# Patient Record
Sex: Male | Born: 1935 | Race: White | Hispanic: No | Marital: Married | Smoking: Former smoker
Health system: Southern US, Community
[De-identification: ages and names within clinical notes are randomized; demographics above are authoritative.]

## PROBLEM LIST (undated history)

## (undated) DIAGNOSIS — I350 Nonrheumatic aortic (valve) stenosis: Secondary | ICD-10-CM

## (undated) DIAGNOSIS — I714 Abdominal aortic aneurysm, without rupture: Secondary | ICD-10-CM

## (undated) DIAGNOSIS — K589 Irritable bowel syndrome without diarrhea: Secondary | ICD-10-CM

## (undated) DIAGNOSIS — I10 Essential (primary) hypertension: Secondary | ICD-10-CM

## (undated) DIAGNOSIS — I4821 Permanent atrial fibrillation: Secondary | ICD-10-CM

## (undated) DIAGNOSIS — I639 Cerebral infarction, unspecified: Secondary | ICD-10-CM

## (undated) DIAGNOSIS — E785 Hyperlipidemia, unspecified: Secondary | ICD-10-CM

## (undated) DIAGNOSIS — K219 Gastro-esophageal reflux disease without esophagitis: Secondary | ICD-10-CM

## (undated) DIAGNOSIS — R569 Unspecified convulsions: Secondary | ICD-10-CM

## (undated) HISTORY — PX: ABDOMINAL AORTIC ANEURYSM REPAIR: SUR1152

## (undated) HISTORY — DX: Hemochromatosis, unspecified: E83.119

## (undated) HISTORY — PX: EYE SURGERY: SHX253

## (undated) HISTORY — DX: Irritable bowel syndrome, unspecified: K58.9

## (undated) HISTORY — DX: Gastro-esophageal reflux disease without esophagitis: K21.9

## (undated) HISTORY — DX: Abdominal aortic aneurysm, without rupture: I71.4

## (undated) HISTORY — DX: Essential (primary) hypertension: I10

## (undated) HISTORY — DX: Permanent atrial fibrillation: I48.21

## (undated) HISTORY — DX: Hyperlipidemia, unspecified: E78.5

## (undated) HISTORY — DX: Cerebral infarction, unspecified: I63.9

## (undated) HISTORY — DX: Nonrheumatic aortic (valve) stenosis: I35.0

---

## 1999-09-19 ENCOUNTER — Encounter: Payer: Self-pay | Admitting: Neurology

## 1999-09-19 ENCOUNTER — Inpatient Hospital Stay (HOSPITAL_COMMUNITY): Admission: EM | Admit: 1999-09-19 | Discharge: 1999-09-22 | Payer: Self-pay | Admitting: Emergency Medicine

## 1999-09-19 ENCOUNTER — Encounter: Payer: Self-pay | Admitting: Emergency Medicine

## 1999-09-20 ENCOUNTER — Encounter: Payer: Self-pay | Admitting: Oncology

## 1999-09-25 ENCOUNTER — Encounter: Admission: RE | Admit: 1999-09-25 | Discharge: 1999-12-24 | Payer: Self-pay | Admitting: Neurology

## 2000-04-12 ENCOUNTER — Ambulatory Visit (HOSPITAL_COMMUNITY): Admission: RE | Admit: 2000-04-12 | Discharge: 2000-04-12 | Payer: Self-pay | Admitting: Gastroenterology

## 2000-04-19 ENCOUNTER — Encounter: Payer: Self-pay | Admitting: Gastroenterology

## 2000-04-19 ENCOUNTER — Ambulatory Visit (HOSPITAL_COMMUNITY): Admission: RE | Admit: 2000-04-19 | Discharge: 2000-04-19 | Payer: Self-pay | Admitting: Gastroenterology

## 2000-04-25 ENCOUNTER — Ambulatory Visit (HOSPITAL_COMMUNITY): Admission: RE | Admit: 2000-04-25 | Discharge: 2000-04-25 | Payer: Self-pay | Admitting: Gastroenterology

## 2000-04-25 ENCOUNTER — Encounter (INDEPENDENT_AMBULATORY_CARE_PROVIDER_SITE_OTHER): Payer: Self-pay | Admitting: *Deleted

## 2000-07-26 ENCOUNTER — Ambulatory Visit (HOSPITAL_COMMUNITY): Admission: RE | Admit: 2000-07-26 | Discharge: 2000-07-26 | Payer: Self-pay | Admitting: Gastroenterology

## 2000-07-26 ENCOUNTER — Encounter: Payer: Self-pay | Admitting: Gastroenterology

## 2000-08-09 ENCOUNTER — Ambulatory Visit (HOSPITAL_COMMUNITY): Admission: RE | Admit: 2000-08-09 | Discharge: 2000-08-09 | Payer: Self-pay | Admitting: Gastroenterology

## 2000-08-09 ENCOUNTER — Encounter: Payer: Self-pay | Admitting: Gastroenterology

## 2000-10-08 ENCOUNTER — Ambulatory Visit (HOSPITAL_COMMUNITY): Admission: RE | Admit: 2000-10-08 | Discharge: 2000-10-08 | Payer: Self-pay | Admitting: Gastroenterology

## 2000-10-08 ENCOUNTER — Encounter: Payer: Self-pay | Admitting: Gastroenterology

## 2000-10-22 ENCOUNTER — Emergency Department (HOSPITAL_COMMUNITY): Admission: EM | Admit: 2000-10-22 | Discharge: 2000-10-22 | Payer: Self-pay | Admitting: Emergency Medicine

## 2000-10-22 ENCOUNTER — Encounter: Payer: Self-pay | Admitting: Emergency Medicine

## 2001-09-18 ENCOUNTER — Emergency Department (HOSPITAL_COMMUNITY): Admission: EM | Admit: 2001-09-18 | Discharge: 2001-09-18 | Payer: Self-pay | Admitting: Emergency Medicine

## 2002-02-24 ENCOUNTER — Encounter: Payer: Self-pay | Admitting: *Deleted

## 2002-02-24 ENCOUNTER — Emergency Department (HOSPITAL_COMMUNITY): Admission: EM | Admit: 2002-02-24 | Discharge: 2002-02-25 | Payer: Self-pay | Admitting: Emergency Medicine

## 2002-03-02 ENCOUNTER — Ambulatory Visit (HOSPITAL_COMMUNITY): Admission: RE | Admit: 2002-03-02 | Discharge: 2002-03-02 | Payer: Self-pay | Admitting: Gastroenterology

## 2002-03-02 ENCOUNTER — Encounter: Payer: Self-pay | Admitting: Gastroenterology

## 2002-05-08 ENCOUNTER — Encounter: Payer: Self-pay | Admitting: Surgery

## 2002-05-11 ENCOUNTER — Encounter: Payer: Self-pay | Admitting: Surgery

## 2002-05-11 ENCOUNTER — Encounter (INDEPENDENT_AMBULATORY_CARE_PROVIDER_SITE_OTHER): Payer: Self-pay | Admitting: *Deleted

## 2002-05-11 ENCOUNTER — Ambulatory Visit (HOSPITAL_COMMUNITY): Admission: RE | Admit: 2002-05-11 | Discharge: 2002-05-11 | Payer: Self-pay | Admitting: Surgery

## 2002-08-06 ENCOUNTER — Ambulatory Visit: Admission: RE | Admit: 2002-08-06 | Discharge: 2002-08-06 | Payer: Self-pay | Admitting: Internal Medicine

## 2002-08-11 ENCOUNTER — Ambulatory Visit (HOSPITAL_COMMUNITY): Admission: RE | Admit: 2002-08-11 | Discharge: 2002-08-11 | Payer: Self-pay | Admitting: Internal Medicine

## 2002-08-11 ENCOUNTER — Encounter: Payer: Self-pay | Admitting: Internal Medicine

## 2002-09-29 ENCOUNTER — Ambulatory Visit (HOSPITAL_COMMUNITY): Admission: RE | Admit: 2002-09-29 | Discharge: 2002-09-29 | Payer: Self-pay | Admitting: Surgery

## 2003-07-28 ENCOUNTER — Encounter (INDEPENDENT_AMBULATORY_CARE_PROVIDER_SITE_OTHER): Payer: Self-pay | Admitting: Specialist

## 2003-07-28 ENCOUNTER — Ambulatory Visit (HOSPITAL_COMMUNITY): Admission: RE | Admit: 2003-07-28 | Discharge: 2003-07-28 | Payer: Self-pay | Admitting: Gastroenterology

## 2004-08-09 ENCOUNTER — Ambulatory Visit: Payer: Self-pay | Admitting: Oncology

## 2004-11-24 ENCOUNTER — Encounter: Admission: RE | Admit: 2004-11-24 | Discharge: 2004-11-24 | Payer: Self-pay | Admitting: Internal Medicine

## 2005-02-13 ENCOUNTER — Ambulatory Visit: Payer: Self-pay | Admitting: Oncology

## 2005-08-21 ENCOUNTER — Ambulatory Visit: Payer: Self-pay | Admitting: Oncology

## 2005-10-30 ENCOUNTER — Encounter: Admission: RE | Admit: 2005-10-30 | Discharge: 2005-10-30 | Payer: Self-pay | Admitting: Internal Medicine

## 2005-11-06 ENCOUNTER — Encounter: Admission: RE | Admit: 2005-11-06 | Discharge: 2005-11-06 | Payer: Self-pay | Admitting: Internal Medicine

## 2006-01-29 ENCOUNTER — Ambulatory Visit: Payer: Self-pay | Admitting: Oncology

## 2006-01-31 LAB — COMPREHENSIVE METABOLIC PANEL
Albumin: 4.9 g/dL (ref 3.5–5.2)
CO2: 29 mEq/L (ref 19–32)
Chloride: 97 mEq/L (ref 96–112)
Glucose, Bld: 75 mg/dL (ref 70–99)
Potassium: 4.7 mEq/L (ref 3.5–5.3)
Sodium: 137 mEq/L (ref 135–145)
Total Protein: 7 g/dL (ref 6.0–8.3)

## 2006-01-31 LAB — CBC WITH DIFFERENTIAL/PLATELET
Eosinophils Absolute: 0.2 10*3/uL (ref 0.0–0.5)
MONO#: 0.6 10*3/uL (ref 0.1–0.9)
NEUT#: 2.6 10*3/uL (ref 1.5–6.5)
RBC: 4.35 10*6/uL (ref 4.20–5.71)
RDW: 13.1 % (ref 11.2–14.6)
WBC: 4.8 10*3/uL (ref 4.0–10.0)

## 2006-01-31 LAB — FERRITIN: Ferritin: 88 ng/mL (ref 22–322)

## 2006-02-21 ENCOUNTER — Encounter: Admission: RE | Admit: 2006-02-21 | Discharge: 2006-02-21 | Payer: Self-pay | Admitting: Internal Medicine

## 2006-06-12 ENCOUNTER — Encounter: Admission: RE | Admit: 2006-06-12 | Discharge: 2006-06-12 | Payer: Self-pay | Admitting: Internal Medicine

## 2006-08-07 ENCOUNTER — Ambulatory Visit: Payer: Self-pay | Admitting: Oncology

## 2006-08-12 LAB — CBC WITH DIFFERENTIAL/PLATELET
BASO%: 1 % (ref 0.0–2.0)
LYMPH%: 17.9 % (ref 14.0–48.0)
MCHC: 36.1 g/dL — ABNORMAL HIGH (ref 32.0–35.9)
MONO#: 0.4 10*3/uL (ref 0.1–0.9)
Platelets: 275 10*3/uL (ref 145–400)
RBC: 4.4 10*6/uL (ref 4.20–5.71)
WBC: 7.7 10*3/uL (ref 4.0–10.0)
lymph#: 1.4 10*3/uL (ref 0.9–3.3)

## 2006-08-12 LAB — FERRITIN: Ferritin: 93 ng/mL (ref 22–322)

## 2006-08-12 LAB — COMPREHENSIVE METABOLIC PANEL
ALT: 16 U/L (ref 0–53)
AST: 17 U/L (ref 0–37)
Alkaline Phosphatase: 55 U/L (ref 39–117)
CO2: 26 mEq/L (ref 19–32)
Sodium: 134 mEq/L — ABNORMAL LOW (ref 135–145)
Total Bilirubin: 1.7 mg/dL — ABNORMAL HIGH (ref 0.3–1.2)
Total Protein: 6.9 g/dL (ref 6.0–8.3)

## 2006-11-04 ENCOUNTER — Encounter: Admission: RE | Admit: 2006-11-04 | Discharge: 2006-11-04 | Payer: Self-pay | Admitting: Internal Medicine

## 2006-12-05 ENCOUNTER — Ambulatory Visit: Payer: Self-pay | Admitting: *Deleted

## 2007-01-30 ENCOUNTER — Ambulatory Visit: Payer: Self-pay | Admitting: Oncology

## 2007-02-06 LAB — COMPREHENSIVE METABOLIC PANEL
ALT: 13 U/L (ref 0–53)
Alkaline Phosphatase: 56 U/L (ref 39–117)
CO2: 24 mEq/L (ref 19–32)
Creatinine, Ser: 0.81 mg/dL (ref 0.40–1.50)
Glucose, Bld: 117 mg/dL — ABNORMAL HIGH (ref 70–99)
Sodium: 134 mEq/L — ABNORMAL LOW (ref 135–145)
Total Bilirubin: 2.1 mg/dL — ABNORMAL HIGH (ref 0.3–1.2)
Total Protein: 7.2 g/dL (ref 6.0–8.3)

## 2007-02-06 LAB — CBC WITH DIFFERENTIAL/PLATELET
BASO%: 0.8 % (ref 0.0–2.0)
HCT: 41.5 % (ref 38.7–49.9)
LYMPH%: 23 % (ref 14.0–48.0)
MCHC: 35.8 g/dL (ref 32.0–35.9)
MCV: 95 fL (ref 81.6–98.0)
MONO%: 7.9 % (ref 0.0–13.0)
NEUT%: 65.7 % (ref 40.0–75.0)
Platelets: 286 10*3/uL (ref 145–400)
RBC: 4.37 10*6/uL (ref 4.20–5.71)

## 2007-02-06 LAB — FERRITIN: Ferritin: 128 ng/mL (ref 22–322)

## 2007-06-05 ENCOUNTER — Ambulatory Visit: Payer: Self-pay | Admitting: *Deleted

## 2007-06-05 ENCOUNTER — Encounter: Admission: RE | Admit: 2007-06-05 | Discharge: 2007-06-05 | Payer: Self-pay | Admitting: *Deleted

## 2007-06-29 DIAGNOSIS — I714 Abdominal aortic aneurysm, without rupture, unspecified: Secondary | ICD-10-CM

## 2007-06-29 HISTORY — DX: Abdominal aortic aneurysm, without rupture: I71.4

## 2007-06-29 HISTORY — DX: Abdominal aortic aneurysm, without rupture, unspecified: I71.40

## 2007-07-18 ENCOUNTER — Inpatient Hospital Stay (HOSPITAL_COMMUNITY): Admission: RE | Admit: 2007-07-18 | Discharge: 2007-07-19 | Payer: Self-pay | Admitting: *Deleted

## 2007-07-18 ENCOUNTER — Encounter (INDEPENDENT_AMBULATORY_CARE_PROVIDER_SITE_OTHER): Payer: Self-pay | Admitting: *Deleted

## 2007-07-18 ENCOUNTER — Ambulatory Visit: Payer: Self-pay | Admitting: *Deleted

## 2007-07-18 HISTORY — PX: ENDOVASCULAR STENT INSERTION: SHX5161

## 2007-08-07 ENCOUNTER — Ambulatory Visit: Payer: Self-pay | Admitting: *Deleted

## 2007-08-08 ENCOUNTER — Ambulatory Visit: Payer: Self-pay | Admitting: Oncology

## 2007-08-12 LAB — CBC WITH DIFFERENTIAL/PLATELET
BASO%: 0.4 % (ref 0.0–2.0)
EOS%: 3.5 % (ref 0.0–7.0)
HGB: 15.3 g/dL (ref 13.0–17.1)
MCH: 33.5 pg — ABNORMAL HIGH (ref 28.0–33.4)
MCHC: 35.7 g/dL (ref 32.0–35.9)
RDW: 12.9 % (ref 11.2–14.6)
lymph#: 1.9 10*3/uL (ref 0.9–3.3)

## 2007-08-28 ENCOUNTER — Encounter: Admission: RE | Admit: 2007-08-28 | Discharge: 2007-08-28 | Payer: Self-pay | Admitting: *Deleted

## 2007-08-28 ENCOUNTER — Ambulatory Visit: Payer: Self-pay | Admitting: *Deleted

## 2007-08-29 ENCOUNTER — Ambulatory Visit (HOSPITAL_COMMUNITY): Admission: RE | Admit: 2007-08-29 | Discharge: 2007-08-29 | Payer: Self-pay | Admitting: Cardiology

## 2008-01-28 ENCOUNTER — Ambulatory Visit: Payer: Self-pay | Admitting: Oncology

## 2008-02-03 LAB — CBC WITH DIFFERENTIAL/PLATELET
BASO%: 0.5 % (ref 0.0–2.0)
LYMPH%: 26 % (ref 14.0–48.0)
MCHC: 35.5 g/dL (ref 32.0–35.9)
MONO#: 0.6 10*3/uL (ref 0.1–0.9)
Platelets: 243 10*3/uL (ref 145–400)
RBC: 4.38 10*6/uL (ref 4.20–5.71)
RDW: 13 % (ref 11.2–14.6)
WBC: 7.1 10*3/uL (ref 4.0–10.0)

## 2008-02-03 LAB — FERRITIN: Ferritin: 184 ng/mL (ref 22–322)

## 2008-02-06 ENCOUNTER — Encounter: Admission: RE | Admit: 2008-02-06 | Discharge: 2008-02-06 | Payer: Self-pay | Admitting: Gastroenterology

## 2008-03-01 LAB — FERRITIN: Ferritin: 140 ng/mL (ref 22–322)

## 2008-03-01 LAB — CBC WITH DIFFERENTIAL/PLATELET
BASO%: 0.5 % (ref 0.0–2.0)
LYMPH%: 28.6 % (ref 14.0–48.0)
MCHC: 35.6 g/dL (ref 32.0–35.9)
MONO#: 0.6 10*3/uL (ref 0.1–0.9)
RBC: 4.18 10*6/uL — ABNORMAL LOW (ref 4.20–5.71)
RDW: 13.4 % (ref 11.2–14.6)
WBC: 6 10*3/uL (ref 4.0–10.0)
lymph#: 1.7 10*3/uL (ref 0.9–3.3)

## 2008-03-11 ENCOUNTER — Ambulatory Visit: Payer: Self-pay | Admitting: *Deleted

## 2008-03-11 ENCOUNTER — Ambulatory Visit: Payer: Self-pay | Admitting: Oncology

## 2008-03-15 LAB — CBC WITH DIFFERENTIAL/PLATELET
Basophils Absolute: 0 10*3/uL (ref 0.0–0.1)
EOS%: 3.3 % (ref 0.0–7.0)
HGB: 13.6 g/dL (ref 13.0–17.1)
MCH: 33.9 pg — ABNORMAL HIGH (ref 28.0–33.4)
NEUT#: 3.8 10*3/uL (ref 1.5–6.5)
RDW: 14.1 % (ref 11.2–14.6)
WBC: 6 10*3/uL (ref 4.0–10.0)
lymph#: 1.4 10*3/uL (ref 0.9–3.3)

## 2008-03-15 LAB — FERRITIN: Ferritin: 110 ng/mL (ref 22–322)

## 2008-03-29 LAB — CBC WITH DIFFERENTIAL/PLATELET
BASO%: 0.6 % (ref 0.0–2.0)
EOS%: 3.6 % (ref 0.0–7.0)
MCH: 34.2 pg — ABNORMAL HIGH (ref 28.0–33.4)
MCV: 97 fL (ref 81.6–98.0)
MONO%: 10.7 % (ref 0.0–13.0)
RBC: 3.89 10*6/uL — ABNORMAL LOW (ref 4.20–5.71)
RDW: 13.8 % (ref 11.2–14.6)
lymph#: 1.2 10*3/uL (ref 0.9–3.3)

## 2008-03-29 LAB — FERRITIN: Ferritin: 54 ng/mL (ref 22–322)

## 2008-09-09 ENCOUNTER — Ambulatory Visit: Payer: Self-pay | Admitting: *Deleted

## 2009-01-17 ENCOUNTER — Ambulatory Visit (HOSPITAL_COMMUNITY): Admission: RE | Admit: 2009-01-17 | Discharge: 2009-01-17 | Payer: Self-pay | Admitting: Cardiology

## 2009-01-27 ENCOUNTER — Ambulatory Visit: Payer: Self-pay | Admitting: Oncology

## 2009-02-01 LAB — CBC WITH DIFFERENTIAL/PLATELET
BASO%: 0.6 % (ref 0.0–2.0)
Basophils Absolute: 0 10*3/uL (ref 0.0–0.1)
EOS%: 2 % (ref 0.0–7.0)
Eosinophils Absolute: 0.1 10*3/uL (ref 0.0–0.5)
HGB: 14.8 g/dL (ref 13.0–17.1)
MCV: 96.3 fL (ref 79.3–98.0)
MONO%: 7.1 % (ref 0.0–14.0)
NEUT#: 4.4 10*3/uL (ref 1.5–6.5)
NEUT%: 67.3 % (ref 39.0–75.0)
RBC: 4.49 10*6/uL (ref 4.20–5.82)
RDW: 13.2 % (ref 11.0–14.6)
WBC: 6.5 10*3/uL (ref 4.0–10.3)

## 2009-08-05 ENCOUNTER — Ambulatory Visit: Payer: Self-pay | Admitting: Oncology

## 2009-08-09 LAB — CBC WITH DIFFERENTIAL/PLATELET
BASO%: 0.3 % (ref 0.0–2.0)
EOS%: 2.8 % (ref 0.0–7.0)
MCH: 34.2 pg — ABNORMAL HIGH (ref 27.2–33.4)
MCV: 96.5 fL (ref 79.3–98.0)
MONO#: 0.5 10*3/uL (ref 0.1–0.9)
MONO%: 7.9 % (ref 0.0–14.0)
NEUT#: 4.4 10*3/uL (ref 1.5–6.5)
Platelets: 215 10*3/uL (ref 140–400)
WBC: 6.7 10*3/uL (ref 4.0–10.3)

## 2009-08-09 LAB — FERRITIN: Ferritin: 114 ng/mL (ref 22–322)

## 2009-08-29 ENCOUNTER — Ambulatory Visit: Payer: Self-pay | Admitting: Surgery

## 2010-02-08 ENCOUNTER — Ambulatory Visit: Payer: Self-pay | Admitting: Oncology

## 2010-02-13 LAB — CBC WITH DIFFERENTIAL/PLATELET
BASO%: 0.3 % (ref 0.0–2.0)
Basophils Absolute: 0 10*3/uL (ref 0.0–0.1)
HCT: 39.6 % (ref 38.4–49.9)
HGB: 13.6 g/dL (ref 13.0–17.1)
LYMPH%: 22 % (ref 14.0–49.0)
MCH: 33.6 pg — ABNORMAL HIGH (ref 27.2–33.4)
MCHC: 34.5 g/dL (ref 32.0–36.0)
NEUT%: 65.2 % (ref 39.0–75.0)
Platelets: 246 10*3/uL (ref 140–400)
RDW: 13.4 % (ref 11.0–14.6)
WBC: 6.5 10*3/uL (ref 4.0–10.3)

## 2010-02-13 LAB — FERRITIN: Ferritin: 122 ng/mL (ref 22–322)

## 2010-08-21 ENCOUNTER — Other Ambulatory Visit: Payer: Self-pay | Admitting: Oncology

## 2010-08-21 ENCOUNTER — Encounter (HOSPITAL_BASED_OUTPATIENT_CLINIC_OR_DEPARTMENT_OTHER): Payer: Medicare Other | Admitting: Oncology

## 2010-08-21 LAB — CBC WITH DIFFERENTIAL/PLATELET
Basophils Absolute: 0 10*3/uL (ref 0.0–0.1)
LYMPH%: 12.8 % — ABNORMAL LOW (ref 14.0–49.0)
MCH: 33.5 pg — ABNORMAL HIGH (ref 27.2–33.4)
MCHC: 34.6 g/dL (ref 32.0–36.0)
MONO%: 7.9 % (ref 0.0–14.0)
RBC: 4.21 10*6/uL (ref 4.20–5.82)

## 2010-08-28 ENCOUNTER — Ambulatory Visit (INDEPENDENT_AMBULATORY_CARE_PROVIDER_SITE_OTHER): Payer: Medicare Other | Admitting: Surgery

## 2010-08-28 ENCOUNTER — Other Ambulatory Visit (INDEPENDENT_AMBULATORY_CARE_PROVIDER_SITE_OTHER): Payer: Medicare Other

## 2010-08-28 ENCOUNTER — Encounter (INDEPENDENT_AMBULATORY_CARE_PROVIDER_SITE_OTHER): Payer: Medicare Other

## 2010-08-28 DIAGNOSIS — I714 Abdominal aortic aneurysm, without rupture: Secondary | ICD-10-CM

## 2010-08-28 DIAGNOSIS — Z48812 Encounter for surgical aftercare following surgery on the circulatory system: Secondary | ICD-10-CM

## 2010-08-28 DIAGNOSIS — G459 Transient cerebral ischemic attack, unspecified: Secondary | ICD-10-CM

## 2010-08-28 DIAGNOSIS — I6529 Occlusion and stenosis of unspecified carotid artery: Secondary | ICD-10-CM

## 2010-08-29 NOTE — Assessment & Plan Note (Signed)
OFFICE VISIT  Eugene Eugene [name], Eugene Eugene [name] DOB:  1935-09-29                                       08/28/2010 CHART#:[id number]  REASON FOR VISIT:  Followup.  HISTORY:  This is Eugene [name] 75 year old gentleman, Eugene [name] former patient of Dr. Madilyn Fireman, who underwent endovascular aneurysm repair on 06/29/2007 with an Endologix device.  We are following him for his aneurysm.  His initial CT scan showed no evidence of endoleak.  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  He has been having issues with diarrhea.  However, this has been resolved with Imodium.  He is on Pradaxa for his atrial fibrillation.  He denies symptoms of abdominal pain.  He has no neurologic symptoms.  PHYSICAL EXAMINATION:  Vital signs:  Heart rate 82, blood pressure 127/87, respiratory rate 16.  General:  He is well-appearing, in no distress.  Respirations:  Nonlabored.  Abdomen:  Soft, nontender, diastasis recti present.  Extremities:  Warm and well-perfused.  DIAGNOSTIC STUDIES: 1. Abdominal ultrasound:  Previous sac measurement was 3.6 x 3.0,     today is 2.6 x 2.6. 2. Carotid duplex:  Bilateral ICA are within normal limits.  ASSESSMENT:  Status post endovascular aneurysm repair.  PLAN:  The patient will need to be followed with yearly surveillance ultrasound.  I plan on having him come back to be seen in the PA clinic next year.  I will see him back in 2 years.    Eugene Ny, MD Electronically Signed  VWB/MEDQ  D:  08/28/2010  T:  08/29/2010  Job:  [id number]  cc:   Eugene Eugene [name], M.D. Eugene Housekeeper, MD Eugene Eugene [name], M.D.

## 2010-09-02 LAB — PROTIME-INR
INR: 2.4 — ABNORMAL HIGH (ref 0.00–1.49)
Prothrombin Time: 26.3 seconds — ABNORMAL HIGH (ref 11.6–15.2)

## 2010-09-11 NOTE — Procedures (Unsigned)
CAROTID DUPLEX EXAM  INDICATION:  History of TIA.  HISTORY: Diabetes:  No. Cardiac:  No. Hypertension:  Yes. Smoking:  Previous.  Quit 2001. Previous Surgery:  No. CV History:  Currently asymptomatic. Amaurosis Fugax No, Paresthesias No, Hemiparesis No                                      RIGHT             LEFT Brachial systolic pressure:         116               112 Brachial Doppler waveforms:         Normal            Normal Vertebral direction of flow:        Antegrade         Antegrade DUPLEX VELOCITIES (cm/sec) CCA peak systolic                   50                52 ECA peak systolic                   58                50 ICA peak systolic                   54                48 ICA end diastolic                   25                22 PLAQUE MORPHOLOGY: PLAQUE AMOUNT:                      None              None PLAQUE LOCATION:   IMPRESSION: 1. Bilateral internal carotid velocities appear within normal limits. 2. Antegrade vertebral arteries bilaterally.       ___________________________________________ V. Charlena Cross, MD  EM/MEDQ  D:  08/28/2010  T:  08/28/2010  Job:  [id number]

## 2010-09-11 NOTE — Procedures (Unsigned)
VASCULAR LAB EXAM  INDICATION:  Follow up AAA endograft placed on 07/18/2007.  HISTORY: Diabetes:  no Cardiac:  no Hypertension:  Yes  EXAM:  AAA sac size 2.68 cm AP;  2.68 cm transverse.  Previous sac size, 08/29/2009, was 3.62 cm AP and 3.0 cm transverse.  IMPRESSION: 1. The aorta and endograft appear patent. 2. No significant change in size of aneurysmal sac surrounding the     endograft. 3. No evidence of endoleak was detected. 4. Limited visualization due to overlying bowel gas.  Patient was     chewing gum upon arrival.  ___________________________________________ V. Charlena Cross, MD  EM/MEDQ  D:  08/28/2010  T:  08/28/2010  Job:  [id number]

## 2010-10-10 NOTE — H&P (Signed)
NAME:  Eugene [name], Eugene [name] NO.:  [id number]   MEDICAL RECORD NO.:  [id number]          PATIENT TYPE:  INP   LOCATION:  2899                         FACILITY:  [facility]   PHYSICIAN:  Balinda Quails, M.D.    DATE OF BIRTH:  04-23-1936   DATE OF ADMISSION:  07/18/2007  DATE OF DISCHARGE:                              HISTORY & PHYSICAL   PRIMARY CARE PHYSICIAN:  [name], MD.   CARDIOLOGIST:  Verdis Prime, MD.   HEMATOLOGIST:  Valentino Hue. Magrinat, M.D.   ADMISSION DIAGNOSIS:  A 4.2 centimeter saccular infrarenal abdominal  aortic aneurysm.   HISTORY:  Eugene [name] is a 75 year old gentleman patient of Dr.  [name] followed in the office with a known abdominal aortic  aneurysm.  This was first discovered during workup for hematuria in  2004.  The patient has undergone a number of CT scans, this is a  saccular configuration aneurysm in the infrarenal aorta.  Most recent CT  scan performed in January revealed the aneurysm to have enlarged to 4.2  cm in maximal diameter.  The patient denied any symptoms of abdominal  pain or back pain.   PAST MEDICAL HISTORY:  1. Hemochromatosis.  2. Hypertension.  3. Hyperlipidemia.  4. Transient ischemic attack.  5. Irritable bowel syndrome.  6. Gastroesophageal reflux.   MEDICATIONS:  1. Nexium 40 mg daily.  2. Caduet 10/10 one tablet daily.  3. Hydrochlorothiazide 25 mg daily.  4. Ramipril 5 mg daily.  5. Plavix 75 mg daily.  6. Aspirin 81 mg every 3 days.   ALLERGIES:  NONE KNOWN.   FAMILY HISTORY:  Father apparently had a history of an abdominal aortic  aneurysm.  He died of heart disease in his 44s.  Mother died at age 82  with a history of hypertension and heart disease.   SOCIAL HISTORY:  The patient discontinued tobacco use in 2000.  He  drinks about 2 alcoholic beverages daily.  He is retired and remarried  for the third time.  Previously widowed twice.  He has 2 grown children.   REVIEW OF  SYSTEMS:  The patient denies weight loss or anorexia.  No  headache.  No chest pain or shortness of breath.  Denies cough or sputum  production.  Bowel habits are regular.  He does have reflux symptoms of  heartburn.  Occasional chronic back pain.   PHYSICAL EXAMINATION:  GENERAL:  Well-appearing 75 year old male alert  and oriented.  No acute distress.  VITAL SIGNS: BP is 123/82, pulse was  82 per minute and irregular, respirations are 18 per minute, O2 sat 96%,  temperature 97.  HEENT:  Mouth and throat are clear.  Normocephalic.  Extraocular  movements intact.  NECK:  Supple.  No thyromegaly or adenopathy.  CARDIOVASCULAR:  Normal heart sounds without murmurs.  Irregular rate  and rhythm.  No carotid bruits.  CHEST:  Clear air entry and equal bilaterally.  No rales or rhonchi.  ABDOMEN:  Soft, nontender.  No masses, organomegaly.  Normal bowel  sounds without bruits.  EXTREMITIES:  2+ femoral, popliteal, posterior tibial, dorsalis pedis  pulses bilaterally.  NEUROLOGIC:  Cranial nerves intact.  Strength equal  bilaterally.  Normal reflexes.  Intact gait.   IMPRESSION:  1. A 4.2 centimeter saccular infrarenal abdominal aortic aneurysm.  2. Hypertension.  3. Hyperlipidemia.  4. Hemochromatosis.  5. History of irritable bowel syndrome.   ADMISSION PLAN:  The patient will be admitted electively to [facility] July 18, 2007 for planned placement of an endovascular  stent for closure of saccular infrarenal abdominal aortic aneurysm.      Balinda Quails, M.D.  Electronically Signed     PGH/MEDQ  D:  07/18/2007  T:  07/19/2007  Job:  [id number]   cc:   Lyn Records, M.D.  Valentino Hue. Magrinat, M.D.  [name], MD

## 2010-10-10 NOTE — Consult Note (Signed)
NAME:  Eugene [name], Eugene [name] NO.:  [id number]   MEDICAL RECORD NO.:  [id number]          PATIENT TYPE:  INP   LOCATION:  3306                         FACILITY:  [facility]   PHYSICIAN:  Lyn Records, M.D.   DATE OF BIRTH:  Nov 03, 1935   DATE OF CONSULTATION:  07/18/2007  DATE OF DISCHARGE:                                 CONSULTATION   REASON FOR CONSULTATION:  Atrial fibrillation of unknown duration.   CONCLUSIONS:  1. Atrial fibrillation of unknown duration, asymptomatic.  2. History of hemochromatosis.  3. COPD.  4. Atherosclerotic abdominal aortic aneurysm.      a.     Prior nuclear stress test approximately 4 years ago,       negative for ischemia by Dr. Mayford Knife.  5. Hypertension.  6. Prior transient ischemic attack approximately 5 to 7 years ago.   RECOMMENDATIONS:  1. The patient is cleared to proceed with abdominal aortic stent-graft      insertion on July 18, 2007.  2. The patient will need long-term Coumadin therapy once cleared for      such by Dr. Madilyn Fireman following this procedure (the patient's  CHADS      score is 4 equating to a yearly embolic risk in the presence of      atrial fibrillation of 8% to 12%).  3. Add low-dose beta-blocker therapy to ensure heart rate/ventricular      response control.  4. At some later date, the patient will likely need to have a repeat      ischemic evaluation as he clearly has diffuse atherosclerosis.  5. Plavix can be discontinued, and aspirin should be decreased to 81      mg per day.  6. 2D echocardiogram will be performed while hospitalized.  7. TSH should be done.   COMMENTS:  The patient is 24 and is being electively admitted today  following stent-graft implantation for abdominal aortic aneurysm by Dr.  Bud Face.   We spoke to the patient concerning his current cardiovascular status.  He denies cardiac symptoms on exertion.  Specifically, he has not had  chest discomfort, exertional dyspnea, claudicative  type symptoms, tachy  palpitations, syncope, or any other cardiovascular complaints.  He does  state that he had a transient ischemic attack in 2001 to 2003 time frame  and was never given a specific explanation of why.  His symptom,  however, was dizziness, and the exact firm diagnosis is not currently  known by me.  He states that this diagnosis was given to him by Dr.  Iran Planas, who was the head of the stroke service at that time.   HABITS:  The patient is a smoker.  He has social alcohol intake.   There is a positive history of coronary atherosclerosis and vascular  disease including stroke.   MEDICATIONS:  1. Zocor 80 mg per day.  2. Aspirin.  3. Plavix.   REVIEW OF SYSTEMS:  Unremarkable.  Weight has been stable.  No blood in  his urine and stool.  He has no known bleeding tendencies.  No other  planned significant surgery.  PHYSICAL EXAMINATION:  VITAL SIGNS:  Blood pressure 126/80, heart rate  86, and respirations 16 and nonlabored.  HEENT:  Unremarkable.  No jugular vein distention.  No carotid bruits.  LUNGS:  Clear.  CARDIAC:  No gallop.  No rub.  No click.  No murmur.  Rhythm is  irregularly irregular.  ABDOMEN:  Soft.  EXTREMITIES:  Reveal no edema.  Femoral pulses are 2+.   DIAGNOSTIC DATA:  EKG , other than revealing atrial fibrillation with  moderate rate control, has no acute ST-T wave change.  Chest x-ray  reveals COPD.   LABORATORY DATA:  Hemoglobin of 14.6, potassium 3.4, creatinine 0.74,  INR is 1.0, total bili is 1.8, calcium is 9.6, and urinalysis is  unremarkable.   DISCUSSION:  The patient is asymptomatic with reference to  cardiovascular symptoms.  He has significant abdominal aortic aneurysm.  He is cleared to proceed with stent-graft implantation today as he is  asymptomatic without evidence of unstable angina, critical valve  lesions, hemodynamically compromising arrhythmia, or CHF.  We will  initiate therapy for atrial fibrillation post  procedure.  Based on the  ACCORD study, the patient will require chronic Coumadin therapy and an  intensification of medication for rate control since he is asymptomatic  with reference to his atrial fibrillation.  We will do an echo to assess  LV function.  Perhaps, the patient's atrial fibrillation is related to  his hemochromatosis.  Certainly, an echo would help [place] to rule out the  possibility of silent LV dysfunction.      Lyn Records, M.D.  Electronically Signed     HWS/MEDQ  D:  07/18/2007  T:  07/19/2007  Job:  [id number]   cc:   Armanda Magic, M.D.  Balinda Quails, M.D.  [name], MD

## 2010-10-10 NOTE — Procedures (Signed)
ENDOVASCULAR STENT GRAFT EXAM   INDICATION:  Followup endovascular stent.   HISTORY:                           DUPLEX EVALUATION   AAA Sac Size:                 3.62 cm CM AP         3.0 cm. CM TRV  Previous Sac Size:            3.8 cm CM AP          4.2 cm CM TRV  Evidence of an endoleak?      No                    No   Velocity Criteria:  Proximal Aorta                33 cm/sec cm/sec  Proximal Stent Graft          43 cm/sec  Main Body Stent Graft-Mid     37 cm/sec  Right Limb-Proximal           92 cm/sec  Right Limb-Distal             67 cm/sec  Left Limb-Proximal            117 cm/sec  Left Limb-Distal              65 cm/sec  Patent Renal Arteries?        Yes                   Yes   IMPRESSION:  Patent endovascular stent with no evidence of an endoleak  or stenosis noted.  Normal ABI with triphasic waveform.   ___________________________________________  V. Charlena Cross, MD   CB/MEDQ  D:  08/29/2009  T:  08/29/2009  Job:  [id number]

## 2010-10-10 NOTE — Assessment & Plan Note (Signed)
OFFICE VISIT   CHARLETON, DEYOUNG  DOB:  December 04, 1935                                       08/28/2007  ZOXWR#:[id number]   Mr. Akhil Piscopo underwent endovascular AAA repair with an Endologix  bifurcated stent graft on 07/18/2007.  This was an uneventful operative  procedure.  He had a low-grade fever for a couple of weeks following  surgery, workup for this was unremarkable.   He has a history of hypertension, hyperlipidemia and hemachromatosis.  He has recent onset atrial fibrillation.   CURRENT MEDICATIONS:  Include Nexium, Caduet, hydrochlorothiazide,  Ramipril, aspirin and Coumadin.   He underwent a CT scan prior to his visit at this time.  This reveals no  complicating features of his Endologix AAA stent graft.  No evidence of  endoleak.  Slight shrinkage of his aneurysm sack already.   Mr. Hedeen appears well.  BP is 116/78, pulse 105 per minute,  respirations 18 per minute.  Right groin incision healed well.  Femoral  pulses 2+ bilaterally.  Abdomen soft and nontender.   Mr. Lieb seems to be doing very well following his AAA stent  graft.  Will plan followup with him again in 6 months with a repeat CT  scan.   Balinda Quails, M.D.  Electronically Signed   PGH/MEDQ  D:  08/28/2007  T:  08/29/2007  Job:  [id number]   cc:   [name], MD  Valentino Hue. Magrinat, M.D.  Armanda Magic, M.D.

## 2010-10-10 NOTE — Assessment & Plan Note (Signed)
OFFICE VISIT   LANCER, THURNER A  DOB:  Dec 20, 1935                                       03/11/2008  CHART#:[id number]   The patient is a 75 year old gentleman who underwent placement of an  endovascular stent graft for a saccular infrarenal aortic aneurysm on  07/18/2007.  He presents at this time for a scheduled followup visit  with CT scan of the abdomen.   Findings include malrotation of the small bowel.  A 2 cm nodule in the  liver and small lung nodules.   His Endologix AAA stent graft reveals no complicating features.  No  evidence of endoleak.   The patient appears well.  Alert and oriented.  No acute distress.  BP  103/69, pulse 76 per minute.  Abdomen soft and nontender.  No  organomegaly or masses.  2+ femoral pulses bilaterally.   The patient shows no complicating features associated with his AAA stent  graft.   I have pointed out to him the findings of his CT scan including the  malrotation, liver nodule which he will follow up with Dr. Darnelle Catalan  about and lung nodules which he will follow up with Dr. Donette Larry  regarding.   Balinda Quails, M.D.  Electronically Signed   PGH/MEDQ  D:  03/11/2008  T:  03/12/2008  Job:  [id number]   cc:   [name], MD  Valentino Hue. Magrinat, M.D.  Armanda Magic, M.D.

## 2010-10-10 NOTE — Op Note (Signed)
NAME:  Eugene [name], Eugene [name] NO.:  [id number]   MEDICAL RECORD NO.:  [id number]          PATIENT TYPE:  INP   LOCATION:  2550                         FACILITY:  [facility]   PHYSICIAN:  Balinda Quails, M.D.    DATE OF BIRTH:  [birth year]   DATE OF PROCEDURE:  07/18/2007  DATE OF DISCHARGE:                               OPERATIVE REPORT   SURGEON:  Balinda Quails, M.D.   ASSISTANT:  Di Kindle. Edilia Bo, M.D.   ANESTHESIA:  General endotracheal anesthesia.   ANESTHESIOLOGIST:  Dr. Jean Rosenthal.   PREOPERATIVE DIAGNOSIS:  4.2 cm saccular infrarenal abdominal aortic  aneurysm.   POSTOPERATIVE DIAGNOSIS:  4.2 cm saccular infrarenal abdominal aortic  aneurysm.   PROCEDURE:  Endovascular aneurysm repair with Endologix bifurcated stent  graft.   COMPLICATIONS:  None apparent.   CLINICAL NOTE:  [name] is a 75 year old male with a  saccular infrarenal abdominal aortic aneurysm.  This has recently  increased in size and now measures 4.2 cm.  He is quite anxious to have  this repaired with a stent graft.  Due to the saccular nature of the  aneurysm, it was felt appropriate to go ahead with the procedure.   PROCEDURE NOTE:  The patient was brought to the operating room in stable  hemodynamic condition.  He as placed under general endotracheal  anesthesia in the supine position.  A Foley catheter and arterial line  were placed.  The abdomen and both groins were prepped and draped in a  sterile fashion.   A right femoral cut down was performed.  An oblique skin incision was  made in the right groin.  The subcutaneous tissue and lymphatics were  divided with electrocautery.  Deep dissection was carried down to expose  the right common femoral artery.  This was mobilized up to the inguinal  ligament and encircled proximally with a vessel loop.  Distal dissection  was carried down controlling small tributaries with clips.  The distal  common femoral artery was then  encircled with a vessel loop.   The patient was administered 7000 units heparin intravenously.  The  ipsilateral right common femoral artery was then accessed with an 18  gauge needle and 0.035 Bentson guidewire was advanced into the abdominal  aorta.  A 12 French tearaway sheath was advanced over the guidewire into  the ipsilateral vessel.  The contralateral left femoral artery was  accessed with an 18 gauge needle percutaneously and a 0.035 Wholey  guidewire advanced up into the abdominal aorta.  A 9-French sheath was  advanced over guidewire and the dilator removed.  The sheath was flushed  with heparin saline solution.   A graduated Omniflush catheter was then advanced over the contralateral  guidewire to the suprarenal aorta.  AP abdominal aortogram was obtained  with contrast injection.  This outlined the saccular nature of the  aneurysm.  Measurement of the length of the infrarenal aorta was 8.5 cm.  The catheter was then removed and a floppy Teena Dunk wire was advanced  through the catheter into the abdominal aorta.  Using a snare, the  contralateral guidewire  was then brought over through the ipsilateral  sheath.  A dual lumen catheter was then passed from the contralateral  side to the ipsilateral side.   The Amplatz super stiff guidewire advanced through the second lumen up  into the abdominal aorta.  The main device then loaded onto the Amplatz  super stiff guidewire in the contralateral limb and brought through the  second lumen of the dual lumen catheter to the contralateral groin.  The  tearaway sheath was then removed from the right groin and the device  advanced up into the abdominal aorta.  The limbs of the device were then  exposed by withdrawing the outer sheath and the contralateral limb  brought down into the left iliac artery.  The main body of the device  was then deployed using the pusher rod.  The contralateral limb was then  released and an 0.014 guidewire  advanced through the SurePass wire into  the main device.  A Kumpe catheter was then advanced over the 0.014  guidewire from the contralateral limb and an exchange made for an 0.035  Wholey guidewire.  The main device sheath was then removed from the  right groin and a 16 French sheath advanced through the right groin.  Post deployment ballooning was then carried out using a 26 coated  balloon at the proximal margin of the graft and this was brought down  into the right limb of the graft and the contralateral limb was dilated  with a 10 x 4 Powerflex balloon.  The balloons were then removed.  Completion arteriogram was  obtained.  This verified excellent position  of the graft without evidence of endoleak and no further filling of this  saccular aneurysm.  Excellent flow was present through the iliac  arteries bilaterally.   Retrograde injection was then made through the left femoral sheath.  This verified position of the sheath in the left common femoral artery.  A Starclose guidewire was advanced through the sheath and the sheath  removed.  The Starclose sheath was advanced over the guidewire, the  Starclose device locked into the sheath, and brought out through the  arterial wall and the device deployed at the left common femoral artery.  There was good hemostasis.   On the right groin, the guidewires and sheaths were removed, the femoral  vessels controlled with clamps, and the transverse arteriotomy closed  with running 5-0 Prolene suture.  The subcutaneous tissue was closed  with running 2-0 Vicryl suture in two layers and the skin closed with 4-  0 Monocryl followed by Dermabond.  The patient tolerated the procedure  well.  There were no apparent complications.  Total fluoroscopy time 3  minutes and 14 seconds.  Contrast was 45 mL of Visipaque.  Estimated  blood loss 50 mL.  The patient was transferred to the recovery room in  stable condition.      Balinda Quails, M.D.   Electronically Signed     PGH/MEDQ  D:  07/18/2007  T:  07/19/2007  Job:  [id number]   cc:   [name], MD  Lyn Records, M.D.  Valentino Hue. Magrinat, M.D.

## 2010-10-10 NOTE — Procedures (Signed)
AORTA-ILIAC DUPLEX EVALUATION   INDICATION:  Followup evaluation of endograft stent.   HISTORY:  Diabetes:  No.  Cardiac:  No.  Hypertension:  Yes.  Smoking:  No.  Previous Surgery:  Endovascular aneurysm repair with Endologic  bifurcated stent graft on 07/18/2007 by Dr. Madilyn Fireman.               SINGLE LEVEL ARTERIAL EXAM                              RIGHT                  LEFT  Brachial:                  144                    137  Anterior tibial:           141                    131  Posterior tibial:          150                    146  Peroneal:  Ankle/brachial index:      1.04                   1.01  Previous ABI/date:   AORTA-ILIAC DUPLEX EXAM  Aorta - Proximal     56 cm/s  Aorta - Mid          73 cm/s  Aorta - Distal       109 cm/s   RIGHT                                   LEFT  144 cm/s          CIA-PROXIMAL          174 cm/s  79 cm/s           CIA-DISTAL            93 cm/s  85 cm/s           HYPOGASTRIC           109 cm/s  100 cm/s          EIA-PROXIMAL          108 cm/s  83 cm/s           EIA-MID               101 cm/s  82 cm/s           EIA-DISTAL            102 cm/s   IMPRESSION:  1. Patent aorto-bifurcated stent graft with no evidence of focal      stenosis or endoleak.  2. Normal bilateral lower extremity ABI.       ___________________________________________  P. Liliane Bade, M.D.   AC/MEDQ  D:  09/09/2008  T:  09/09/2008  Job:  [id number]

## 2010-10-10 NOTE — Assessment & Plan Note (Signed)
OFFICE VISIT   Eugene [name], Eugene [name]  DOB:  February 06, 1936                                       08/07/2007  GNFAO#:[id number]   Patient underwent placement of an endovascular stent for treatment of [name]  saccular infrarenal abdominal aortic aneurysm on 07/18/07.  He has noted  [name] low grade fever at night with some chills.  This is ranging in the 99  range.   He has had no drainage from his incisions.  Denies dysuria.  No cough or  sputum production.   CURRENT MEDICATIONS:  1. Nexium 40 mg daily.  2. Caduet 10/10 daily.  3. Hydrochlorothiazide 25 mg daily.  4. Ramipril 5 mg daily.  5. Plavix 75 mg daily.  6. Aspirin every 3 days.  7. Coumadin.   Coumadin is his new medication that he has recently started and does  take this at night.   Evaluation reveals BP 112/74, pulse 60 per minute and regular,  respirations 18 per minute.  Temperature 96.6.  His right groin incision  is healing unremarkably without evidence of complication.   I am very suspicious this may be due to his Coumadin, and I have asked  him to change the time that he takes his Coumadin and see if his fever  pattern changes.  Further orders include [name] CBC, blood cultures x2  randomly taken, along with [name] urinalysis.  Return per scheduled visit in  [name] couple of weeks.   Balinda Quails, M.D.  Electronically Signed   PGH/MEDQ  D:  08/07/2007  T:  08/08/2007  Job:  [id number]

## 2010-10-10 NOTE — Consult Note (Signed)
VASCULAR SURGERY CONSULTATION   Eugene [name]  DOB:  03-21-1936                                       12/05/2006  CHART#:[id number]   REASON FOR CONSULTATION:  [name] 4.8 cm abdominal aortic aneurysm.   HISTORY:  [name] is [name] 75 year old male, referred by Dr.  [name] for further evaluation and management of known abdominal  aortic aneurysm.  This was first discovered in 2004 during workup for  hematuria.  Patient underwent [name] CT scan at that time and was found to  have [name] 4 cm abdominal aortic aneurysm.  He has been followed by Dr.  Donette Larry with serial ultrasounds on [name] six-monthly basis.  Most recent  ultrasound, carried out November 04, 2006, reveals his aneurysm to measure [name]  maximal diameter of 4.8 cm.  Previous ultrasound revealed [name] maximal  diameter of 4.6 cm, six months earlier.   Eugene [name] has been free of any pain, denies abdominal pain or flank  pain.   PAST MEDICAL HISTORY:  1. Hemochromatosis.  2. Hypertension.  3. Hyperlipidemia.  4. Transient ischemic attack.  5. Irritable bowel syndrome and gastroesophageal reflux.   MEDICATIONS:  1. Nexium 40 mg daily.  2. Caduet 10/10 daily.  3. Plavix 75 mg daily.  4. Aspirin 81 mg daily.  5. Hydrochlorothiazide 25 mg daily.  6. Ramipril 5 mg daily.   ALLERGIES:  None known.   FAMILY HISTORY:  Father apparently had [name] history of an abdominal aortic  aneurysm.  He died of heart disease in his eighties.  Mother died at age  85 with history of hypertension and heart disease.   SOCIAL HISTORY:  Patient discontinued tobacco use in 2000.  He drinks  two alcoholic beverages daily.  He is retired and remarried for the  third time, previously widowed twice.  He has two grown children.   REVIEW OF SYSTEMS:  Refer to patient encounter form.  No weight-loss or  anorexia.  Denies headache.  No chest pain or shortness of breath.  No  cough or sputum production.  Bowel habits regular.  He does  have acid  reflux.  Occasional chronic back pain.   PHYSICAL EXAMINATION:  General:  Well-appearing, 75 year old male.  Vital signs:  Blood pressure 138/84, pulse 73 per minute, respirations  were 20 per minute.  HEENT:  Extraocular movements intact.  Pupils equal  and reactive.  Mouth and throat clear.  Neck supple, no thyromegaly or  adenopathy.  Cardiovascular:  Normal heart sounds, without murmurs.  No  carotid bruits.  Chest is clear with equal air entry bilaterally.  Abdomen is soft and nontender.  No organomegaly or masses felt.  Bowel  sounds active.  Extremities:  Two-plus femoral pulses bilaterally.  Intact popliteal, posterior tibial and dorsalis pedis pulses  bilaterally.  Neurologic:  Cranial nerves intact.  Strength equal  bilaterally.  Normal reflexes.  Gait intact.   IMPRESSION:  1. [name] 4.8 cm abdominal aortic aneurysm.  2. Hypertension.  3. Hyperlipidemia.  4. Hemochromatosis.  5. History of transient ischemic attack.  6. Irritable bowel syndrome.   RECOMMENDATIONS:  Plan followup in six months with CT scan of the  abdomen and pelvis.  Patient given information regarding abdominal  aortic aneurysm surveillance and treatment.   Balinda Quails, M.D.  Electronically Signed  PGH/MEDQ  D:  12/05/2006  T:  12/06/2006  Job:  [id number]

## 2010-10-10 NOTE — Op Note (Signed)
NAMELUAY, BALDING NO.:  [id number]   MEDICAL RECORD NO.:  [id number]          PATIENT TYPE:  OIB   LOCATION:  2899                         FACILITY:  [facility]   PHYSICIAN:  Armanda Magic, M.D.     DATE OF BIRTH:  1935/07/25   DATE OF PROCEDURE:  01/17/2009  DATE OF DISCHARGE:  01/17/2009                               OPERATIVE REPORT   REFERRING PHYSICIAN:  [name], MD   PROCEDURE:  Direct current cardioversion.   OPERATOR:  Armanda Magic, MD   INDICATIONS:  Atrial fibrillation.   COMPLICATIONS:  None.   MEDICATIONS:  Pentothal 150 mg IV.   This is a 75 year old male with a history of paroxysmal atrial  fibrillation who presents with atrial fibrillation, has been  anticoagulated on Coumadin with an INR of greater than 2 for 4 weeks.  He now presents for cardioversion.   [facility] the fasting nonsedated  state.  Informed consent was obtained.  The patient was connected to  continuous heart rate and pulse oximetry monitoring and intermittent  blood pressure monitoring.  The defibrillator pads were placed on the  left anterior chest and left posterior back.  After adequate anesthesia  was obtained, a 150-joule biphasic synchronized shock was delivered  which successfully converted the patient to sinus rhythm.  The patient  tolerated the procedure well without complications.  He was subsequently  discharged to home when fully awake and ambulating.   ASSESSMENT:  1. Atrial fibrillation.  2. Systemic anticoagulation with therapeutic INR of greater than 2 for      4 weeks.  3. Successful cardioversion to sinus rhythm.   PLAN:  Discharge to home when fully awake.  Follow up with me in 2  weeks.      Armanda Magic, M.D.  Electronically Signed     TT/MEDQ  D:  01/17/2009  T:  01/17/2009  Job:  [id number]

## 2010-10-10 NOTE — Assessment & Plan Note (Signed)
OFFICE VISIT   Eugene [name], Eugene [name]  DOB:  Sep 16, 1935                                       06/05/2007  CHART#:[id number]   The patient returned to the office today for continued surveillance of  his known abdominal aortic aneurysm.  He had [name] CT scan prior to his  visit at this time.  This reveals [name] saccular infrarenal abdominal aortic  aneurysm measuring 4.2 cm by CT.  He has had no abdominal pain or back  pain.   His past history includes hypertension, hyperlipidemia and  hemochromatosis.   He remains on aspirin, Plavix, Caduet, Nexium, hydrochlorothiazide and  ramipril.   No recent chest pain or shortness of breath.  Denies visual, sensory or  motor deficit.   PHYSICAL EXAMINATION:  General:  The patient is [name] 75 year old gentleman  who appears generally well.  Vital signs:  BP 125/71, pulse 119 per  minute, respirations 18 per minute.  Abdomen:  Abdomen is soft,  nontender.  No masses or organomegaly.  Extremities:  2+ femoral pulses  bilaterally.  No ankle edema.   The patient does have [name] saccular abdominal aortic aneurysm which has  increased slightly in size.  The saccular component of this is quite  concerning to me and I do feel he likely would be best treated with [name]  stent to go ahead and cover this.  We will make arrangements to have  this evaluated and potential stent placement.  I have asked him to  contact Dr. Darnelle Catalan to assure that he does not have to have anything  done with regard to his hemochromatosis prior to surgery.   Balinda Quails, M.D.  Electronically Signed   PGH/MEDQ  D:  06/05/2007  T:  06/06/2007  Job:  [id number]   cc:   [name], MD  Valentino Hue. Magrinat, M.D.

## 2010-10-10 NOTE — Op Note (Signed)
NAMEESSAM, LOWDERMILK NO.:  [id number]   MEDICAL RECORD NO.:  [id number]          PATIENT TYPE:  OIB   LOCATION:  2899                         FACILITY:  [facility]   PHYSICIAN:  Armanda Magic, M.D.     DATE OF BIRTH:  Oct 05, 1935   DATE OF PROCEDURE:  08/29/2007  DATE OF DISCHARGE:                               OPERATIVE REPORT   REFERRING PHYSICIAN:  Thora Lance, M.D.   PROCEDURE:  Direct current cardioversion.   OPERATOR:  Armanda Magic, MD   INDICATIONS:  Atrial fibrillation.   COMPLICATIONS:  None.   MEDICATIONS:  Pentothal 200 mg IV.   This is a 75 year old male with a history of abdominal aortic aneurysm  status post recent stenting by Dr. Madilyn Fireman.  He also has a history of  hypertension and prior TIA, normal LV function by echo, who presents for  a cardioversion now after having a therapeutic INR of greater than 2 for  4 weeks on Coumadin.   The patient is brought to the day hospital in the fasting, nonsedated  state.  Informed consent was obtained.  The patient was connected to  continuous heart rate and pulse oximetry monitoring and intermittent  blood pressure monitoring.  Defibrillator pads were placed in left  anterior chest and left posterior back.  After adequate anesthesia was  obtained, a 150-joule biphasic shock was delivered which successfully  converted the patient to sinus rhythm.  The patient tolerated the  procedure well with no complications.   ASSESSMENT:  1. Atrial fibrillation.  2. Systemic anticoagulation with therapeutic INR of greater than 2 for      4 weeks.  3. Successful cardioversion to normal sinus rhythm.   PLAN:  1. Discharge to home after fully alert and awake.  Follow up with my      nurse practitioner in 2 weeks.  We will increase his metoprolol to      25 mg b.i.d. to suppress the PACs he was having post cardioversion.  2. We will also change his Coumadin to 5 mg Monday, Wednesday, Friday,      all other days 7.5  mg, given that his INR was 3.2 today.  He will      follow up with my nurse practitioner again in 2 weeks and also have      a PT and INR check in 1 week.      Armanda Magic, M.D.  Electronically Signed     TT/MEDQ  D:  08/29/2007  T:  08/29/2007  Job:  [id number]   cc:   Thora Lance, M.D.

## 2010-10-10 NOTE — Assessment & Plan Note (Signed)
OFFICE VISIT   Eugene [name], Eugene [name]  DOB:  03-06-1936                                       08/29/2009  CHART#:[id number]   This is [name] 75 year old gentleman, former patient of Dr. Madilyn Fireman, who is  status post endovascular repair of an infrarenal aneurysm on 06/29/2007  using the Endologix device.  The patient has been followed with serial  CT scans.  His aneurysm sac has been getting smaller with no evidence of  endoleak.  He comes in today for further follow-up.   On CT scan, the patient was diagnosed with malrotation as well as some  nodules on his liver and lung.  He continues to have diarrhea, and this  is currently being evaluated.  The nodules on the liver and lung appear  to be benign.  The patient suffers from atrial fibrillation.  He has  been switched from Coumadin to Pradaxa.  He also suffers from  hypertension and hypercholesterolemia, which are medically managed.   REVIEW OF SYSTEMS:  GENERAL:  Negative for fevers, chills, weight gain,  or weight loss.  CARDIAC:  Positive for atrial fibrillation.  PULMONARY:  Negative.  GI:  Positive for diarrhea.  GU:  Negative.  VASCULAR:  Negative.  NEURO:  Negative.  MUSCULOSKELETAL:  Negative.  PSYCH:  Negative.  EENT:  Negative.  HEME:  Negative.  SKIN:  Positive for rash on stomach.   PAST MEDICAL HISTORY:  Abdominal aortic aneurysm, hypertension,  hypercholesterolemia, hemochromatosis.   FAMILY HISTORY:  Negative for cardiovascular disease at an early age.   SOCIAL HISTORY:  He is s married with 4 children.  He is retired.  Does  not smoke, quit in 2001.  Drinks 1 to 2 alcoholic beverages per day.   ALLERGIES:  None.   PHYSICAL EXAMINATION:  Heart rate 86, blood pressure 116/75, temperature  98.1.  generally, is well-appearing in no acute distress.  HEENT:  Within normal limits.  Lungs are clear bilaterally.  Cardiovascular:  Irregular rate.  No murmur.  No carotid bruits.  Palpable pedal  pulses.  Abdomen is soft, nontender.  Musculoskeletal is without major  deformities.  Neuro:  There is no focal weaknesses.  Skin is without  rash.   DIAGNOSTIC STUDIES:  Ultrasound was performed today that shows [name]  decrease in the aneurysm size, now measures 3.6 cm, down from 3.8.  There is no evidence of endoleak.  Ankle brachial indices are within  normal limits.   ASSESSMENT:  Status post endovascular aneurysm repair.   PLAN:  The patient will follow up in our office in 1 year with [name] repeat  duplex ultrasound.  At that time, he will also have [name] carotid  ultrasound, as I am unable to find that he has had his carotids  evaluated.  All patient's questions were answered today, and we will see  him back in [name] year.     Jorge Ny, MD  Electronically Signed   VWB/MEDQ  D:  08/29/2009  T:  08/30/2009  Job:  [id number]   cc:   Valentino Hue. Magrinat, M.D.  [name], MD  Armanda Magic, M.D.

## 2010-10-13 NOTE — Op Note (Signed)
NAME:  Eugene [name], Eugene [name]                        ACCOUNT NO.:  [id number]   MEDICAL RECORD NO.:  [id number]                   PATIENT TYPE:  AMB   LOCATION:  ENDO                                 FACILITY:  [facility]   PHYSICIAN:  Anselmo Rod, M.D.               DATE OF BIRTH:  [birth year]   DATE OF PROCEDURE:  07/30/2003  DATE OF DISCHARGE:  07/28/2003                                 OPERATIVE REPORT   PROCEDURE PERFORMED:  Colonoscopy with snare polypectomy times three.   ENDOSCOPIST:  Anselmo Rod, M.D.   INSTRUMENT:  Olympus video colonoscope.   INDICATIONS FOR PROCEDURE:  A 75 year old white male with a history of  hemachromatosis.  He underwent screening colonoscopy to rule out colonic  polyps, masses, etc.   PREPROCEDURE PREPARATION:  Informed consent was secured from the patient.  The patient fasted for eight hours prior to the procedure and prepped with a  bottle of magnesium citrate and a gallon of GoLYTELY the night prior to the  procedure.   PREPROCEDURE PHYSICAL:  The patient had stable vital signs.  Neck:  Supple.  Chest:  Clear to auscultation.  Heart:  S1, S2 regular.  Abdomen:  Soft with  normal bowel sounds.   DESCRIPTION OF PROCEDURE:  The patient was placed in the left lateral  decubitus position, sedated with 80 mg of Demerol and 8 mg of Versed  intravenously.  Once the patient was adequately sedated and maintain on low-  flow oxygen and continuous cardiac monitoring the Olympus video colonoscope  was advanced from the rectum to the mid right colon with difficulty.  There  was evidence of pan diverticulosis.  Two small sessile polyps were snared  from the rectum and 25 cm, respectively.  A small sessile polyp was snared  from 30 cm.  Small lesions could have missed secondary to a relatively poor  prep.  Internal hemorrhoids were seen on retroflexion.  The cecal base was  not completely visualized as there was a large amount of residual stool in  the colon.   The patient's position was changed from the left lateral to the  supine and the right lateral position with gentle application of abdominal  pressure to complete the procedure.   IMPRESSION:  1. Pan diverticulosis.  2. Three polyps removed from the colon, one from the rectum, one from 25 cm,     and one from 30 cm.  3. Small internal hemorrhoids.  4. Large amount of residual stool in the colon, especially cecum and right     colon.  Small lesions could have been missed.   RECOMMENDATIONS:  1. Await pathology results.  2. Avoid nonsteroidals including aspirin for the next four weeks.  3. Hold off Plavix for the next three days.  4. Outpatient followup in the next two weeks.  Anselmo Rod, M.D.    JNM/MEDQ  D:  07/30/2003  T:  08/01/2003  Job:  [id number]   cc:   [name], M.D.  [address]  [place]  [place] [address]  Fax: [phone]   Valentino Hue. Magrinat, M.D.  [address] [facility]  [place]  [place] [address]  Fax: [phone]

## 2010-10-13 NOTE — Discharge Summary (Signed)
[facility]  Patient:    Eugene [name], Eugene [name]                     MRN: [id number] Adm. Date:  [id number] Disc. Date: [id number] Attending:  Glean Hess D                           Discharge Summary  DIAGNOSES: 1. Brainstem infarction, medial longitudinal fasciculus. 2. Hypertension. 3. Hemochromatosis. 4. Lung granulomas.  PROCEDURES AND INTERVENTIONS: 1. CT scan of the head. 2. CT scan of the chest. 3. MRI/MRA of the brain. 4. Two-dimensional echocardiogram, Doppler.  HISTORY OF PRESENT ILLNESS:  Patient is a 75 year old man who presented to [facility] emergency room for history of sudden onset of double vision, tingling on the left side of his mouth, tongue, lips, lasting about 30 minutes.  Patient remained with deficits of his vision and, specifically, he was seeing double at the time of initial examination.  He denied any other symptoms, no ataxia, no loss of consciousness, and no prior strokes or TIAs.  His initial neurological examination showed patient awake and alert, speech fluent, memory and language normal.  Cranial nerves show evidence of a right internuclear ophthalmoplegia, right INO.  Face symmetric, moderate strength, equal bilaterally.  HOSPITAL COURSE:  Patient was admitted to the hospital for the workup and testing for cerebrovascular disease.  He underwent MRI/MRA of the brain which was essentially normal.  It is well known that DWI images can be negative on initial scans in brainstem stroke up to 10 to 15%.  MRA:  No ______ stenosis. A 2D echocardiogram was essentially normal.  He also underwent a CT of the chest to exclude malignancy although it was only found to have granulomas in his lungs.  He was treated with IV fluids for hemodynamic support and heparin stroke protocol.  His neurological status improved during his hospital stay. He was able to adduct his right eye better than from the date of admission. He was  also given an eye patch to alternate from the right to the left eye every two or three hours and occupational therapy was obtained.  He was eventually discontinued from heparin and switched to Plavix for secondary long-term stroke prevention.  He was discharged home in stable condition with outpatient OT.  He is to return to my office for a follow-up visit on Oct 26, 1999, and with Dr. ______, primary care physician, in two to three weeks. Also, his blood pressure medication was adjusted.  DISCHARGE MEDICATIONS: 1. Atenolol 50 mg once a day. 2. Norvasc 10 mg once a day. 3. Plavix 75 mg once a day. 4. Wellbutrin 150 twice a day for smoking cessation therapy.  He also was    strongly advised to quit smoking. DD:  09/22/99 TD:  09/23/99 Job: [id number]

## 2010-10-13 NOTE — H&P (Signed)
[place]  Patient:    Eugene [name], Eugene [name]                     MRN: [id number] Adm. Date:  [id number] Attending:  Glean Hess D                         History and Physical  CHIEF COMPLAINT:  Blurred vision.  HISTORY OF PRESENT ILLNESS:  This patient is a 75 year old man who earlier around noon developed the sudden onset of blurred vision in his right eye, followed by  numbness and tingling of the left side of the tongue and lips, lasting for about 30 minutes.  This was followed by double vision.  He denies slurred speech, no weakness except for some quivering of both of his knees.  He denies ataxia or loss of consciousness.  No prior strokes or TIAs.  He also had some nausea at the onset of his symptoms.  He came to the emergency room.  While in the emergency room he had a 20-second episode of supraventricular tachycardia, resolving spontaneously.  PAST MEDICAL HISTORY: 1. Hypertension. 2. Hemachromatosis for the last eight years.  Was treated with phlebotomies.    The last phlebotomy was about two months ago. 3. History of right-sided Bells palsy.  CURRENT MEDICATIONS:  Atenolol 50 mg once q.d.  His primary care physician is Dr. Raymond Gurney C. Magrinat, phone [phone].  ALLERGIES:  MYCINS.  SOCIAL HISTORY:  He smokes about one pack per day.  He drinks socially.  He is currently retired.  He used to work as the International aid/development worker.  FAMILY HISTORY:  Noncontributory for strokes.  REVIEW OF SYSTEMS:  As per the history of present illness.  PHYSICAL EXAMINATION:  GENERAL:  The patient is laying on the stretcher, in no distress.  HEENT:  Head normocephalic, atraumatic.  NECK:  Supple, no bruits.  LUNGS:  Clear.  HEART:  Sounds, regular rate, no murmurs.  ABDOMEN:  Soft, bowel sounds present.  No visceromegaly.  EXTREMITIES:  No cyanosis or edema.  NEUROLOGIC:  Awake, alert, oriented.  Speech is fluent.  Memory  and language are normal.  Pupils equal, round, reactive bilaterally.  Extraocular cephalic movements impaired, with the presence of a right internuclear ophthalmoplegia, (right INO). Face is symmetric.  Tongue is midline.  Motor examination equal bilaterally. No ataxia or incoordination.  Reflexes +2 throughout.  Plantars downgoing.  Gait evaluation was deferred.  LABORATORY DATA:  WBC count 8.5, hemoglobin 18.2, hematocrit 52.2, platelet count 188.  Sodium 135, potassium 3.9, chloride 99, bicarbonate 25, glucose 89, BUN 9, creatinine 0.8.  NEURO IMAGING:  CT scan of the patients brain shows no acute ischemic changes, o hemorrhage.  IMPRESSION: 1. 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 2. Hypertension. 3. Hemachromatosis.  The plan, recommendations, diagnosis, condition, and further intervention were discussed at length with the patient.  He will be admitted to the neuro science  unit for further workup and testing for cerebrovascular disease.  Initial management will consist of hemodynamic support with IV fluids, normal saline at 100 cc an hour, oxygen 2 L, and a heparin ischemic stroke protocol.  The patient will be scheduled for noninvasive imaging of the intra and extracranial vessels for evaluation with stenosis.  Studies will include a 2-D echocardiogram for evaluation of embolic sources as the cause of the stroke.  The patient will remain on heparin  until his workup is completed, upon which a decision will be made in regards to  secondary stroke prevention.  Will also prescribe an eye patch to be alternated  between the right and the left eye every two to three hours.  Dr. Genene Churn. Granf[place]a was notified of the patients admission, who is covering for Dr. Raymond Gurney C. Magrinat.DD:  09/19/99 TD:  09/18/99 Job: [id number]

## 2010-10-13 NOTE — Op Note (Signed)
NAMEAIDEN, [name] NO.:  [id number]   MEDICAL RECORD NO.:  [id number]                   PATIENT TYPE:  OIB   LOCATION:  2895                                 FACILITY:  [facility]   PHYSICIAN:  Abigail Miyamoto, M.D.              DATE OF BIRTH:  1935/08/14   DATE OF PROCEDURE:  05/11/2002  DATE OF DISCHARGE:                                 OPERATIVE REPORT   PREOPERATIVE DIAGNOSIS:  Biliary dyskinesia.   POSTOPERATIVE DIAGNOSIS:  Biliary dyskinesia.   OPERATION PERFORMED:  Laparoscopic cholecystectomy.   SURGEON:  Douglas A. Magnus Ivan, M.D.   ASSISTANT:  Gita Kudo, M.D.   ANESTHESIA:  General endotracheal.   ESTIMATED BLOOD LOSS:  Minimal.   OPERATIVE FINDINGS:  The patient was found to have a chronically scarred  appearing gallbladder.  Cholangiogram was normal.   DESCRIPTION OF PROCEDURE:  The patient was brought to the operating room and  identified.  He was placed supine on the operating table and general  anesthesia was induced.  His abdomen was then prepped and draped in the  usual sterile fashion.  Using a #15 blade, a small transverse incision was  made below the umbilicus.  The incision was carried down to the fascia which  was then opened with a scalpel.  Hemostat was then used to pass into the  peritoneal cavity.  Next, a 0 Vicryl pursestring suture was placed around  the fascial opening.  The Hasson port was placed through the opening and  insufflation of the abdomen was begun.  Next, an 11 mm port was placed in  the patient's epigastrium and two 5 mm ports were placed in the patient's  right flank under direct vision.  The gallbladder was then grasped and  retracted above the liver bed.  Dissection was then carried out to the base  of the gallbladder.  The cystic duct was then dissected out and clipped once  distally.  It was then partly transected with the laparoscopic scissors.  Then the Angiocath was inserted under direct  vision in the right upper  quadrant.  A cholangiocatheter was then inserted through the Angiocath and  then placed into the cystic duct.  A cholangiogram was then performed under  direct fluoroscopy with contrast.  Good flow of contrast was seen into the  entire biliary system as well as to the duodenum without abnormality.  At  this point the cholangiocatheter and Angiocath were removed.  The cystic  duct was the clipped three times proximally and transected with scissors.  The cystic artery and a branch were dissected down and clipped proximally  and distally and transected as well.  The gallbladder was then dissected  free from the liver bed with the electrocautery.  Once the gallbladder was  free from the liver bed, the liver bed was examined and hemostasis was  achieved with the cautery.  The gallbladder was then removed through the  incision at the umbilicus.  The 0 Vicryl at the umbilicus was tied in place  closing the fascial defect.  The abdomen was then copiously irrigated with  normal saline.  Again, hemostasis appeared to  be achieved.  All ports were  then removed under direct vision and the abdomen was deflated.  All  incisions were anesthetized with 0.25% Marcaine  and closed with 4-0 Vicryl subcuticular sutures.  Steri-Strips, gauze and  tape were then applied.  The patient tolerated the procedure well.  All  sponge, needle and instrument counts were correct at the end of the  procedure.  The patient was then extubated in the operating room and taken  in stable condition to the recovery room.                                                   Abigail Miyamoto, M.D.    DB/MEDQ  D:  05/11/2002  T:  05/11/2002  Job:  [id number]   cc:   Anselmo Rod, M.D.  [address]  [place]  [place] [address]  Fax: [phone]

## 2010-10-13 NOTE — Procedures (Signed)
[facility]  Patient:    Eugene [name], Eugene [name]                     MRN: [id number] Proc. Date: 04/25/00 Adm. Date:  [id number] Attending:  Charna Elizabeth CC:         Valentino Hue. Magrinat, M.D.   Procedure Report  DATE OF BIRTH:  [birth year]  REFERRING PHYSICIAN:  Valentino Hue. Magrinat, M.D.  PROCEDURE PERFORMED:  Colonoscopy with snare polypectomy x 2.  ENDOSCOPIST:  Anselmo Rod, M.D.  INSTRUMENT USED:  Olympus video colonoscope.  INDICATIONS FOR PROCEDURE:  Blood in stool in a 75 year old white male, rule out colonic polyps, masses, hemorrhoids, etc.  PREPROCEDURE PREPARATION:  Informed consent was procured from the patient. The patient was fasted for eight hours prior to the procedure and prepped with a bottle of magnesium citrate and a gallon of NuLytely the night prior to the procedure.  PREPROCEDURE PHYSICAL:  The patient had stable vital signs.  Neck supple. Chest clear to auscultation.  S1, S2 regular.  Abdomen soft with normal abdominal bowel sounds.  DESCRIPTION OF PROCEDURE:  The patient was placed in the left lateral decubitus position and sedated with 80 mcg of fentanyl and 8 mg of Versed intravenously.  Once the patient was adequately sedated and maintained on low-flow oxygen and continuous cardiac monitoring, the Olympus video colonoscope was advanced from the rectum to the cecum with slight difficulty secondary to some residual stool in the colon.  The cecum and appendicular orifice were clearly visualized and so was the ileocecal valve.  There was a small polyp that was snared from 30 cm, another polyp that was snared from teh rectum.  Both the polyps were sessile.  There was some residual stool in the colon.  There were no large masses or polyps seen.  There was a small external hemorrhoid seen on anal inspection and small internal hemorrhoids seen on retroflexion.  There was evidence of pandiverticular disease and  several diverticula in different stages of formation with more prominent changes in the left colon.  IMPRESSION: 1. Pandiverticulosis with more prominent changes in the left colon. 2. Two polyps snared, one from the rectum, one from 30 cm, both put in the    same bottle. 3. No large masses or polyps seen. 4. Small external hemorrhoid. 5. Small nonbleeding internal hemorrhoid.  RECOMMENDATIONS: 1. Await pathology results. 2. Increase fluid and fiber in diet. 3. Outpatient follow-up in the next two weeks.DD:  04/25/00 TD:  04/25/00 Job: [id number]

## 2010-10-13 NOTE — Op Note (Signed)
NAME:  Eugene [name], POITRA NO.:  [id number]   MEDICAL RECORD NO.:  [id number]                   PATIENT TYPE:  OIB   LOCATION:  NA                                   FACILITY:  [facility]   PHYSICIAN:  Abigail Miyamoto, M.D.              DATE OF BIRTH:  [birth year]   DATE OF PROCEDURE:  09/29/2002  DATE OF DISCHARGE:                                 OPERATIVE REPORT   PREOPERATIVE DIAGNOSIS:  Left inguinal hernia.   POSTOPERATIVE DIAGNOSIS:  Left inguinal hernia.   OPERATION PERFORMED:  Laparoscopic left inguinal hernia repair with mesh.   SURGEON:  Douglas A. Magnus Ivan, MD   ANESTHESIA:  General endotracheal and 0.25% Marcaine.   ESTIMATED BLOOD LOSS:  Minimal.   DESCRIPTION OF PROCEDURE:  The patient was brought to the operating room and  identified.  He was placed supine on the operating table and general  anesthesia was induced.  His abdomen was then prepped and draped in the  usual sterile fashion.  Using a #15 blade a small transverse incision was  made below the umbilicus.  This was carried down to the fascia which was  then opened with a scalpel.  The rectus muscle was then identified and  elevated.  The dissecting balloon was then passed underneath the rectus  muscle and manipulated toward the pubis.  The balloon was then insufflated  under direct vision dissecting out the preperitoneal space.  The large  dissecting balloon was then removed and the small balloon port was placed  through the incision.  Insufflation was then carried out with CO2.  Next,  two 5 mm ports placed in patient's midline under direct vision.  Dissection  was then carried out in the left inguinal area.  The patient was found to  have a large indirect left inguinal hernia as well as a large lipoma along  the testicular cord.  No other hernias identified.  A lot of dissection had  to be performed in order to completely reduce the chronic indirect left sac.  This was finally  accomplished and the entire sac was pulled back out of the  inguinal canal.  Cooper's ligament was easily identified.  Next, a piece of  Bard precut Prolene mesh was brought onto the field.  The mesh was a large  size piece of mesh.  The mesh was marked and then placed through the port at  the umbilicus.  The mesh was then opened up and placed in an onlay fashion  over the left inguinal area and cord structures.  It was intact to the  Cooper's ligament up the medial abdominal wall and out laterally.  The sac  and lipoma were brought over the top of the mesh and tacked down as well.  Good coverage of the inguinal floor and cord structures appeared to be  achieved.  At this point the ports were removed under direct vision and the  preperitoneal space was deflated.  All incisions were then anesthetized with  0.25% Marcaine.  The fascial defect at the umbilicus was closed with a  figure-of-eight 0 Vicryl suture.  All skin incisions were then closed with 4-  0 Monocryl subcuticular sutures.  Steri-Strips, gauze and tape were then  applied.  The patient tolerated the procedure well.  All sponge, needle and  instrument counts were correct at the end of the procedure.  The patient was  then extubated in the operating room and taken in stable condition to the  recovery room.                                                     Abigail Miyamoto, M.D.    DB/MEDQ  D:  09/29/2002  T:  09/29/2002  Job:  [id number]

## 2011-01-05 ENCOUNTER — Other Ambulatory Visit: Payer: Self-pay | Admitting: Surgery

## 2011-01-25 ENCOUNTER — Other Ambulatory Visit: Payer: Self-pay | Admitting: Surgery

## 2011-02-15 ENCOUNTER — Other Ambulatory Visit: Payer: Self-pay | Admitting: Oncology

## 2011-02-15 ENCOUNTER — Encounter (HOSPITAL_BASED_OUTPATIENT_CLINIC_OR_DEPARTMENT_OTHER): Payer: Medicare Other | Admitting: Oncology

## 2011-02-15 LAB — CBC WITH DIFFERENTIAL/PLATELET
BASO%: 0.6 % (ref 0.0–2.0)
Basophils Absolute: 0 10*3/uL (ref 0.0–0.1)
HCT: 39.3 % (ref 38.4–49.9)
HGB: 14.1 g/dL (ref 13.0–17.1)
LYMPH%: 27 % (ref 14.0–49.0)
MCHC: 36 g/dL (ref 32.0–36.0)
MONO#: 0.5 10*3/uL (ref 0.1–0.9)
NEUT%: 60.4 % (ref 39.0–75.0)
Platelets: 182 10*3/uL (ref 140–400)
WBC: 6 10*3/uL (ref 4.0–10.3)
lymph#: 1.6 10*3/uL (ref 0.9–3.3)

## 2011-02-15 LAB — COMPREHENSIVE METABOLIC PANEL
AST: 22 U/L (ref 0–37)
Albumin: 4.7 g/dL (ref 3.5–5.2)
BUN: 9 mg/dL (ref 6–23)
Calcium: 9.1 mg/dL (ref 8.4–10.5)
Chloride: 97 mEq/L (ref 96–112)
Creatinine, Ser: 0.89 mg/dL (ref 0.50–1.35)
Glucose, Bld: 77 mg/dL (ref 70–99)

## 2011-02-16 LAB — BLOOD GAS, ARTERIAL
Drawn by: 181601
FIO2: 0.21
Patient temperature: 98.6
TCO2: 25.8
pCO2 arterial: 37.1
pH, Arterial: 7.438

## 2011-02-16 LAB — COMPREHENSIVE METABOLIC PANEL
ALT: 21
AST: 22
Albumin: 4.2
Alkaline Phosphatase: 50
BUN: 9
Chloride: 100
GFR calc Af Amer: >60
Potassium: 3.4 — ABNORMAL LOW
Sodium: 132 — ABNORMAL LOW
Total Bilirubin: 1.8 — ABNORMAL HIGH
Total Protein: 6.6

## 2011-02-16 LAB — CBC
HCT: 41.5
MCHC: 34.9
MCV: 96.8
Platelets: 235
Platelets: 252
RBC: 3.85 — ABNORMAL LOW
RDW: 12.9
WBC: 6.4

## 2011-02-16 LAB — URINALYSIS, ROUTINE W REFLEX MICROSCOPIC
Glucose, UA: NEGATIVE
Specific Gravity, Urine: 1.007

## 2011-02-16 LAB — URINE MICROSCOPIC-ADD ON

## 2011-02-16 LAB — BASIC METABOLIC PANEL
BUN: 6
CO2: 26
Chloride: 102
Creatinine, Ser: 0.79

## 2011-02-16 LAB — PROTIME-INR
INR: 1
Prothrombin Time: 13.9

## 2011-02-16 LAB — TYPE AND SCREEN
ABO/RH(D): O POS
Antibody Screen: NEGATIVE

## 2011-02-20 LAB — PROTIME-INR: Prothrombin Time: 33.7 — ABNORMAL HIGH

## 2011-02-26 ENCOUNTER — Encounter (HOSPITAL_BASED_OUTPATIENT_CLINIC_OR_DEPARTMENT_OTHER): Payer: Medicare Other | Admitting: Oncology

## 2011-07-07 ENCOUNTER — Telehealth: Payer: Self-pay | Admitting: Oncology

## 2011-07-07 NOTE — Telephone Encounter (Signed)
called pt and provided appts for april-oct2013

## 2011-07-17 ENCOUNTER — Other Ambulatory Visit: Payer: Self-pay | Admitting: Surgery

## 2011-08-27 ENCOUNTER — Other Ambulatory Visit (HOSPITAL_BASED_OUTPATIENT_CLINIC_OR_DEPARTMENT_OTHER): Payer: Medicare Other

## 2011-08-27 ENCOUNTER — Encounter: Payer: Self-pay | Admitting: Surgery

## 2011-08-27 LAB — CBC WITH DIFFERENTIAL/PLATELET
BASO%: 0.9 % (ref 0.0–2.0)
Basophils Absolute: 0.1 10*3/uL (ref 0.0–0.1)
EOS%: 2.9 % (ref 0.0–7.0)
Eosinophils Absolute: 0.2 10*3/uL (ref 0.0–0.5)
HCT: 39.6 % (ref 38.4–49.9)
HGB: 13.9 g/dL (ref 13.0–17.1)
LYMPH%: 27.1 % (ref 14.0–49.0)
MCH: 33.9 pg — ABNORMAL HIGH (ref 27.2–33.4)
MCHC: 35.2 g/dL (ref 32.0–36.0)
MCV: 96.3 fL (ref 79.3–98.0)
MONO#: 0.7 10*3/uL (ref 0.1–0.9)
MONO%: 10.1 % (ref 0.0–14.0)
NEUT#: 4.1 10*3/uL (ref 1.5–6.5)
NEUT%: 59 % (ref 39.0–75.0)
Platelets: 205 10*3/uL (ref 140–400)
RBC: 4.11 10*6/uL — ABNORMAL LOW (ref 4.20–5.82)
RDW: 13.2 % (ref 11.0–14.6)
WBC: 7 10*3/uL (ref 4.0–10.3)
lymph#: 1.9 10*3/uL (ref 0.9–3.3)
nRBC: 0 % (ref 0–0)

## 2011-08-27 LAB — FERRITIN: Ferritin: 191 ng/mL (ref 22–322)

## 2011-08-29 ENCOUNTER — Other Ambulatory Visit: Payer: Self-pay | Admitting: *Deleted

## 2011-08-30 ENCOUNTER — Other Ambulatory Visit: Payer: Self-pay | Admitting: *Deleted

## 2011-08-31 ENCOUNTER — Other Ambulatory Visit: Payer: Medicare Other | Admitting: Lab

## 2011-08-31 ENCOUNTER — Ambulatory Visit (HOSPITAL_BASED_OUTPATIENT_CLINIC_OR_DEPARTMENT_OTHER): Payer: Medicare Other

## 2011-08-31 ENCOUNTER — Encounter: Payer: Self-pay | Admitting: Surgery

## 2011-08-31 LAB — FERRITIN: Ferritin: 185 ng/mL (ref 22–322)

## 2011-09-03 ENCOUNTER — Encounter: Payer: Self-pay | Admitting: Surgery

## 2011-09-03 ENCOUNTER — Ambulatory Visit (INDEPENDENT_AMBULATORY_CARE_PROVIDER_SITE_OTHER): Payer: Medicare Other | Admitting: *Deleted

## 2011-09-03 ENCOUNTER — Telehealth: Payer: Self-pay | Admitting: Oncology

## 2011-09-03 ENCOUNTER — Ambulatory Visit (INDEPENDENT_AMBULATORY_CARE_PROVIDER_SITE_OTHER): Payer: Medicare Other | Admitting: Surgery

## 2011-09-03 VITALS — BP 120/78 | HR 82 | Temp 97.5°F | Ht 73.0 in | Wt 211.7 lb

## 2011-09-03 DIAGNOSIS — Z48812 Encounter for surgical aftercare following surgery on the circulatory system: Secondary | ICD-10-CM

## 2011-09-03 DIAGNOSIS — I714 Abdominal aortic aneurysm, without rupture, unspecified: Secondary | ICD-10-CM | POA: Insufficient documentation

## 2011-09-03 DIAGNOSIS — I6529 Occlusion and stenosis of unspecified carotid artery: Secondary | ICD-10-CM | POA: Insufficient documentation

## 2011-09-03 NOTE — Telephone Encounter (Signed)
S/w the pt's wife and she is aware of the lab and phlebotomy appt on 09/05/2011@2 :15pm

## 2011-09-03 NOTE — Progress Notes (Signed)
[facility]   Patient name: Eugene [name] MRN: [id number] DOB: [birth year] Sex: male     Chief Complaint  Patient presents with  . AAA    1 year followup with labs    HISTORY OF PRESENT ILLNESS: The patient is back today for followup. He is a former patient of Dr. Madilyn Fireman. He is status post endovascular aneurysm repair on 06/29/2007 using an Endologix device. He is back today for followup. He has no complaints. He denies symptoms of claudication. He has no neurologic symptoms.  Past Medical History  Diagnosis Date  . Hypertension   . Hyperlipidemia   . Hemochromatosis   . Atrial fibrillation   . AAA (abdominal aortic aneurysm) 06/29/2007    stent graft  . Stroke     Transient Ischemic Attack  . Irritable bowel syndrome     Past Surgical History  Procedure Date  . Endovascular stent insertion 07/18/2007    saccular infrarenal aortic aneurysm   History   Social History  . Marital Status: Married    Spouse Name: N/A    Number of Children: N/A  . Years of Education: N/A   Occupational History  . Not on file.   Social History Main Topics  . Smoking status: Former Smoker    Types: Cigarettes    Quit date: 06/18/1999  . Smokeless tobacco: Not on file  . Alcohol Use: 0.0 oz/week    1-2 drink(s) per week  . Drug Use: No  . Sexually Active:    Other Topics Concern  . Not on file   Social History Narrative  . No narrative on file    Family History  Problem Relation Age of Onset  . Hypertension Mother   . Heart disease Mother     AAA, he passed in his 60 s of Heart Diseasse  . Heart disease Father     AAA history and he passed at age 41  of   Disease    Allergies as of 09/03/2011  . (No Known Allergies)    Current Outpatient Prescriptions on File Prior to Visit  Medication Sig Dispense Refill  . AMLODIPINE BESYLATE PO Take 10 mg by mouth.      . Ascorbic Acid (VITAMIN C) 100 MG tablet Take 100 mg by mouth daily.      Marland Kitchen  aspirin 81 MG tablet Take 81 mg by mouth daily.      . dabigatran (PRADAXA) 150 MG CAPS Take 150 mg by mouth every 12 (twelve) hours.      Marland Kitchen esomeprazole (NEXIUM) 40 MG capsule Take 40 mg by mouth daily before breakfast.      . hydrochlorothiazide (HYDRODIURIL) 25 MG tablet Take 25 mg by mouth daily.      . hyoscyamine (LEVBID) 0.375 MG 12 hr tablet Take 0.375 mg by mouth every 12 (twelve) hours as needed.      Marland Kitchen LORazepam (ATIVAN) 0.5 MG tablet Take 0.5 mg by mouth every 8 (eight) hours.      . metoprolol succinate (TOPROL-XL) 25 MG 24 hr tablet Take 25 mg by mouth daily.      . ramipril (ALTACE) 1.25 MG capsule Take 5 mg by mouth daily.      . rosuvastatin (CRESTOR) 10 MG tablet Take 10 mg by mouth daily.      Marland Kitchen thiamine (VITAMIN B-1) 100 MG tablet Take 250 mg by mouth daily.      . vitamin E 100 UNIT capsule Take 100 Units  by mouth daily.         REVIEW OF SYSTEMS: Per the patient and these are unchanged from prior exam.  PHYSICAL EXAMINATION:   Vital signs are BP 120/78  Pulse 82  Temp(Src) 97.5 F (36.4 C) (Oral)  Ht 6\' 1"  (1.854 m)  Wt 211 lb 11.2 oz (96.026 kg)  BMI 27.93 kg/m2  SpO2 98% General: The patient appears their stated age. HEENT:  No gross abnormalities Pulmonary:  Non labored breathing Abdomen: Soft and non-tender. Right groin incision is well-healed. Left percutaneous site is unremarkable Musculoskeletal: There are no major deformities. Neurologic: No focal weakness or paresthesias are detected, Skin: There are no ulcer or rashes noted. Psychiatric: The patient has normal affect. Cardiovascular: Palpable right dorsalis pedis non-palpable left. Femoral pulses are palpable bilaterally   Diagnostic Studies Duplex ultrasound was performed today this shows a slight decrease in the aneurysm sac size. Previously it measured 2.68 cm today it measures 2.56 cm  Assessment: Status post endovascular aneurysm repair Plan: The patient will continue with yearly  ultrasound surveillance. Last year I looked at his carotid arteries with duplex. He had minimal disease.  Jorge Ny, M.D. [facility] Office: [phone] Pager:  [phone]

## 2011-09-04 NOTE — Progress Notes (Signed)
Addended by: Sharee Pimple on: 09/04/2011 08:20 AM   Modules accepted: Orders

## 2011-09-05 ENCOUNTER — Ambulatory Visit (HOSPITAL_BASED_OUTPATIENT_CLINIC_OR_DEPARTMENT_OTHER): Payer: Medicare Other

## 2011-09-05 ENCOUNTER — Other Ambulatory Visit: Payer: Medicare Other | Admitting: Lab

## 2011-09-10 ENCOUNTER — Other Ambulatory Visit: Payer: Self-pay | Admitting: *Deleted

## 2011-09-10 NOTE — Procedures (Unsigned)
VASCULAR LAB EXAM  INDICATION:  Follow up AAA endograft placed 07/18/2007.  HISTORY: Diabetes:  No. Cardiac:  No. Hypertension:  Yes.  EXAM:  AAA sac size 2.56 cm AP.  Previous sac size 08/28/2010 2.68 cm AP, 2.68 transverse.  IMPRESSION: 1. Aorta and endograft appear patent where visualized. 2. No significant change in size of the aneurysmal sac surrounding the     endograft. 3. No evidence of endoleak was detected. 4. Limited visualization due to overlying bowel gas, patient was     chewing gum and ate breakfast.  ___________________________________________ V. Charlena Cross, MD  EM/MEDQ  D:  09/03/2011  T:  09/03/2011  Job:  [id number]

## 2011-09-16 ENCOUNTER — Other Ambulatory Visit: Payer: Self-pay | Admitting: Oncology

## 2011-09-18 ENCOUNTER — Other Ambulatory Visit: Payer: Self-pay | Admitting: *Deleted

## 2011-09-19 ENCOUNTER — Other Ambulatory Visit: Payer: Medicare Other | Admitting: Lab

## 2011-09-19 ENCOUNTER — Ambulatory Visit (HOSPITAL_BASED_OUTPATIENT_CLINIC_OR_DEPARTMENT_OTHER): Payer: Medicare Other

## 2011-09-19 LAB — FERRITIN: Ferritin: 127 ng/mL (ref 22–322)

## 2011-09-19 LAB — CBC WITH DIFFERENTIAL/PLATELET
Basophils Absolute: 0 10*3/uL (ref 0.0–0.1)
Eosinophils Absolute: 0.2 10*3/uL (ref 0.0–0.5)
HGB: 13.5 g/dL (ref 13.0–17.1)
MCV: 96.6 fL (ref 79.3–98.0)
MONO#: 0.5 10*3/uL (ref 0.1–0.9)
NEUT#: 4.7 10*3/uL (ref 1.5–6.5)
RDW: 13.8 % (ref 11.0–14.6)
WBC: 6.9 10*3/uL (ref 4.0–10.3)
lymph#: 1.4 10*3/uL (ref 0.9–3.3)
nRBC: 0 % (ref 0–0)

## 2011-09-19 NOTE — Progress Notes (Signed)
Pt left immediately after phlebotomy, did not wait 1/2 hour.

## 2011-09-26 ENCOUNTER — Other Ambulatory Visit: Payer: Self-pay | Admitting: *Deleted

## 2011-10-03 ENCOUNTER — Ambulatory Visit (HOSPITAL_BASED_OUTPATIENT_CLINIC_OR_DEPARTMENT_OTHER): Payer: Medicare Other

## 2011-10-03 ENCOUNTER — Other Ambulatory Visit: Payer: Self-pay | Admitting: *Deleted

## 2011-10-03 ENCOUNTER — Other Ambulatory Visit (HOSPITAL_BASED_OUTPATIENT_CLINIC_OR_DEPARTMENT_OTHER): Payer: Medicare Other | Admitting: Lab

## 2011-10-03 LAB — CBC WITH DIFFERENTIAL/PLATELET
BASO%: 0.5 % (ref 0.0–2.0)
EOS%: 2.5 % (ref 0.0–7.0)
HCT: 37.3 % — ABNORMAL LOW (ref 38.4–49.9)
LYMPH%: 24.7 % (ref 14.0–49.0)
MCH: 34 pg — ABNORMAL HIGH (ref 27.2–33.4)
MCHC: 36.2 g/dL — ABNORMAL HIGH (ref 32.0–36.0)
MCV: 94 fL (ref 79.3–98.0)
MONO%: 6.2 % (ref 0.0–14.0)
NEUT%: 66.1 % (ref 39.0–75.0)
Platelets: 202 10*3/uL (ref 140–400)
RBC: 3.97 10*6/uL — ABNORMAL LOW (ref 4.20–5.82)

## 2011-10-03 LAB — FERRITIN: Ferritin: 88 ng/mL (ref 22–322)

## 2011-10-03 NOTE — Progress Notes (Signed)
1515 Phlebotomy started using 20G needle. Patient offered fluids. VSS. 1530 Phlebotomy completed with 509cc of blood removed. 1552 Patient refused to stay for full 30 min post observation. VSS. Patient tolerated treatment well. Ambulating with no c/o dizziness.

## 2011-10-03 NOTE — Patient Instructions (Signed)
Therapeutic Phlebotomy Therapeutic phlebotomy is the controlled removal of blood from your body for the purpose of treating a medical condition. It is similar to donating blood. Usually, about a pint (470 mL) of blood is removed. The average adult has 9 to 12 pints (4.3 to 5.7 L) of blood. Therapeutic phlebotomy may be used to treat the following medical conditions:  Hemochromatosis. This is a condition in which there is too much iron in the blood.   Polycythemia vera. This is a condition in which there are too many red cells in the blood.   Porphyria cutanea tarda. This is a disease usually passed from one generation to the next (inherited). It is a condition in which an important part of hemoglobin is not made properly. This results in the build up of abnormal amounts of porphyrins in the body.   Sickle cell disease. This is an inherited disease. It is a condition in which the red blood cells form an abnormal crescent shape rather than a round shape.  LET YOUR CAREGIVER KNOW ABOUT:  Allergies.   Medicines taken including herbs, eyedrops, over-the-counter medicines, and creams.   Use of steroids (by mouth or creams).   Previous problems with anesthetics or numbing medicine.   History of blood clots.   History of bleeding or blood problems.   Previous surgery.   Possibility of pregnancy, if this applies.  RISKS AND COMPLICATIONS This is a simple and safe procedure. Problems are unlikely. However, problems can occur and may include:  Nausea or lightheadedness.   Low blood pressure.   Soreness, bleeding, swelling, or bruising at the needle insertion site.   Infection.  BEFORE THE PROCEDURE  This is a procedure that can be done as an outpatient. Confirm the time that you need to arrive for your procedure. Confirm whether there is a need to fast or withhold any medications. It is helpful to wear clothing with sleeves that can be raised above the elbow. A blood sample may be done  to determine the amount of red blood cells or iron in your blood. Plan ahead of time to have someone drive you home after the procedure. PROCEDURE The entire procedure from preparation through recovery takes about 1 hour. The actual collection takes about 10 to 15 minutes.  A needle will be inserted into your vein.   Tubing and a collection bag will be attached to that needle.   Blood will flow through the needle and tubing into the collection bag.   You may be asked to open and close your hand slowly and continuously during the entire collection.   Once the specified amount of blood has been removed from your body, the collection bag and tubing will be clamped.   The needle will be removed.   Pressure will be held on the site of the needle insertion to stop the bleeding. Then a bandage will be placed over the needle insertion site.  AFTER THE PROCEDURE  Your recovery will be assessed and monitored. If there are no problems, as an outpatient, you should be able to go home shortly after the procedure.  Document Released: 10/16/2010 Document Revised: 05/03/2011 Document Reviewed: 10/16/2010 [employer] Patient Information 2012 [employer]. 

## 2011-10-17 ENCOUNTER — Other Ambulatory Visit: Payer: Self-pay | Admitting: Oncology

## 2011-10-17 ENCOUNTER — Other Ambulatory Visit: Payer: Self-pay | Admitting: *Deleted

## 2011-10-17 ENCOUNTER — Ambulatory Visit (HOSPITAL_BASED_OUTPATIENT_CLINIC_OR_DEPARTMENT_OTHER): Payer: Medicare Other

## 2011-10-17 ENCOUNTER — Other Ambulatory Visit (HOSPITAL_BASED_OUTPATIENT_CLINIC_OR_DEPARTMENT_OTHER): Payer: Medicare Other | Admitting: Lab

## 2011-10-17 LAB — CBC WITH DIFFERENTIAL/PLATELET
Basophils Absolute: 0.1 10*3/uL (ref 0.0–0.1)
Eosinophils Absolute: 0.2 10*3/uL (ref 0.0–0.5)
HGB: 13.6 g/dL (ref 13.0–17.1)
LYMPH%: 21.3 % (ref 14.0–49.0)
MCV: 97.3 fL (ref 79.3–98.0)
MONO%: 8.7 % (ref 0.0–14.0)
NEUT#: 4.9 10*3/uL (ref 1.5–6.5)
Platelets: 223 10*3/uL (ref 140–400)
RDW: 13.9 % (ref 11.0–14.6)

## 2011-10-17 LAB — FERRITIN: Ferritin: 56 ng/mL (ref 22–322)

## 2011-10-17 NOTE — Progress Notes (Signed)
69 cc obtained by phlebotomy. Patient tolerated well. Blood stopped, patient did not want additional attempts. Vital signs stable. Patient did not want to stay to be observed.

## 2012-02-19 ENCOUNTER — Other Ambulatory Visit (HOSPITAL_BASED_OUTPATIENT_CLINIC_OR_DEPARTMENT_OTHER): Payer: Medicare Other | Admitting: Lab

## 2012-02-19 LAB — CBC WITH DIFFERENTIAL/PLATELET
Basophils Absolute: 0 10*3/uL (ref 0.0–0.1)
EOS%: 3.1 % (ref 0.0–7.0)
Eosinophils Absolute: 0.2 10*3/uL (ref 0.0–0.5)
HCT: 38 % — ABNORMAL LOW (ref 38.4–49.9)
HGB: 13.4 g/dL (ref 13.0–17.1)
MCH: 34.3 pg — ABNORMAL HIGH (ref 27.2–33.4)
NEUT#: 4.5 10*3/uL (ref 1.5–6.5)
NEUT%: 66.8 % (ref 39.0–75.0)
lymph#: 1.4 10*3/uL (ref 0.9–3.3)

## 2012-02-25 ENCOUNTER — Ambulatory Visit (HOSPITAL_BASED_OUTPATIENT_CLINIC_OR_DEPARTMENT_OTHER): Payer: Medicare Other | Admitting: Oncology

## 2012-02-25 ENCOUNTER — Telehealth: Payer: Self-pay | Admitting: Oncology

## 2012-02-25 NOTE — Progress Notes (Signed)
ID: [name]   DOB: [birth year]  MR#: [id number]  WUJ#:[id number]  PCP: [name], MD SU:  OTHER MD: Hillery Aldo   HISTORY OF PRESENT ILLNESS: [name]'s hemochromatosis was documented by labs and genetics in 1989. His family (sister, children) have been tested and all are negative for the mutation  INTERVAL HISTORY: Dear returns today for followup of his hemochromatosis. The interval history is generally unremarkable. He was found to have atrial fibrillation, and was cardioverted. He still has not sold the house he built for his second wife, [name], and that is mostly what he does for exercise (take care of the 4 acres and keep the house up).  REVIEW OF SYSTEMS: He has mild sinus symptoms, but otherwise a detailed review of systems today was entirely negative.  PAST MEDICAL HISTORY: Past Medical History  Diagnosis Date  . Hypertension   . Hyperlipidemia   . Hemochromatosis   . [place]ath-induced atrial fibrillation   . AAA (abdominal aortic aneurysm) 06/29/2007    stent graft  . Stroke     Transient Ischemic Attack  . Irritable bowel syndrome     PAST SURGICAL HISTORY: Past Surgical History  Procedure Date  . Endovascular stent insertion 07/18/2007    saccular infrarenal aortic aneurysm    FAMILY HISTORY Family History  Problem Relation Age of Onset  . Hypertension Mother   . Heart disease Mother     AAA, he passed in his 34 s of Heart Diseasse  . Heart disease Father     AAA history and he passed at age 25  of   Disease   the patient's father died from a myocardial infarction at age 34. The patient's mother died from lung problems at age 83. He had no brothers, one sister. There is no history of hemochromatosis in the family to his knowledge.  SOCIAL HISTORY: [name] has 2 children from his first marriage, [name], who is a Runner, broadcasting/film/video in [place], with one adopted child, and Rayansh Herbst a, 57, who works in Engineer, materials a, and has one biologic in one  adopted child. [name]'s second wife, [name], who was my patient, had 2 children from her prior marriage, and [name]'s current wife, [name], also has 2 children of her out. [name] and [name] married in 2007. The patient attends a [employer].   ADVANCED DIRECTIVES: not in place  HEALTH MAINTENANCE: History  Substance Use Topics  . Smoking status: Former Smoker    Types: Cigarettes    Quit date: 06/18/1999  . Smokeless tobacco: Not on file  . Alcohol Use: 0.0 oz/week    1-2 drink(s) per week    No Known Allergies  Current Outpatient Prescriptions  Medication Sig Dispense Refill  . AMLODIPINE BESYLATE PO Take 10 mg by mouth.      . Ascorbic Acid (VITAMIN C) 100 MG tablet Take 100 mg by mouth daily.      Marland Kitchen aspirin 81 MG tablet Take 81 mg by mouth daily.      . dabigatran (PRADAXA) 150 MG CAPS Take 150 mg by mouth every 12 (twelve) hours.      Marland Kitchen esomeprazole (NEXIUM) 40 MG capsule Take 40 mg by mouth daily before breakfast.      . hydrochlorothiazide (HYDRODIURIL) 25 MG tablet Take 25 mg by mouth daily.      . hyoscyamine (LEVBID) 0.375 MG 12 hr tablet Take 0.375 mg by mouth every 12 (twelve) hours as needed.      Marland Kitchen LORazepam (ATIVAN) 0.5 MG  tablet Take 0.5 mg by mouth every 8 (eight) hours.      . metoprolol succinate (TOPROL-XL) 25 MG 24 hr tablet Take 25 mg by mouth daily.      . ramipril (ALTACE) 1.25 MG capsule Take 5 mg by mouth daily.      . rosuvastatin (CRESTOR) 10 MG tablet Take 10 mg by mouth daily.      Marland Kitchen thiamine (VITAMIN B-1) 100 MG tablet Take 250 mg by mouth daily.      . vitamin E 100 UNIT capsule Take 100 Units by mouth daily.      Marland Kitchen CIALIS 20 MG tablet       . KLOR-CON 10 10 MEQ tablet         OBJECTIVE: Middle-aged white male in no acute distress Filed Vitals:   02/25/12 0926  BP: 118/70  Pulse: 93  Temp: 97.8 F (36.6 C)  Resp: 20     Body mass index is 27.93 kg/(m^2).    ECOG FS: 0  Sclerae unicteric Oropharynx clear No cervical or supraclavicular  adenopathy Lungs no rales or rhonchi Heart regular rate and rhythm Abd benign MSK no focal spinal tenderness, no peripheral edema Neuro: nonfocal   LAB RESULTS:  Results for JSHAUN, ABERNATHY (MRN [id number]) as of 02/25/2012 09:34  Ref. Range 08/31/2011 10:41 09/19/2011 13:58 10/03/2011 14:07 10/17/2011 12:34 02/19/2012 13:20  Ferritin Latest Range: 22-322 ng/mL 185 127 88 56 76    Lab Results  Component Value Date   WBC 6.8 02/19/2012   NEUTROABS 4.5 02/19/2012   HGB 13.4 02/19/2012   HCT 38.0* 02/19/2012   MCV 97.3 02/19/2012   PLT 210 02/19/2012      Chemistry      Component Value Date/Time   NA 131* 02/15/2011 1325   K 3.6 02/15/2011 1325   CL 97 02/15/2011 1325   CO2 23 02/15/2011 1325   BUN 9 02/15/2011 1325   CREATININE 0.89 02/15/2011 1325      Component Value Date/Time   CALCIUM 9.1 02/15/2011 1325   ALKPHOS 40 02/15/2011 1325   AST 22 02/15/2011 1325   ALT 17 02/15/2011 1325   BILITOT 1.7* 02/15/2011 1325       No results found for this basename: LABCA2    No components found with this basename: LABCA125    No results found for this basename: INR:1;PROTIME:1 in the last 168 hours  Urinalysis    Component Value Date/Time   COLORURINE YELLOW 07/15/2007 1041   APPEARANCEUR CLEAR 07/15/2007 1041   LABSPEC 1.007 07/15/2007 1041   PHURINE 7.5 07/15/2007 1041   GLUCOSEU NEGATIVE 07/15/2007 1041   HGBUR TRACE* 07/15/2007 1041   BILIRUBINUR NEGATIVE 07/15/2007 1041   KETONESUR NEGATIVE 07/15/2007 1041   PROTEINUR NEGATIVE 07/15/2007 1041   UROBILINOGEN 0.2 07/15/2007 1041   NITRITE NEGATIVE 07/15/2007 1041   LEUKOCYTESUR NEGATIVE 07/15/2007 1041    STUDIES: No results found.  ASSESSMENT: 76 y.o. [place] man with a history of hemochromatosis diagnosed in 1989, treated with intermittent phlebotomies.   PLAN: [name] is doing fine as far as his hemochromatosis is concerned. One would expect his ferritin to have increased further. My suspicion is that he has some occult GI bleeding  secondary to his anticoagulants, and although this is certainly minimal and not clinically apparent, it may be sufficient to keep his ferritin at an acceptable level. We are going to continue to check his lab work every 6 months and visits once a year. Though some experts recommend a  more strict trigger for phlebotomy, I am comfortable with our current practice and when his ferritin rises above 150, we will resume phlebotomy.   MAGRINAT,GUSTAV C    02/25/2012

## 2012-02-25 NOTE — Telephone Encounter (Signed)
gve the pt his march 2014 appt calendar along with the sept,oct 2014 appts

## 2012-02-26 ENCOUNTER — Ambulatory Visit: Payer: Medicare Other | Admitting: Oncology

## 2012-08-19 ENCOUNTER — Other Ambulatory Visit (HOSPITAL_BASED_OUTPATIENT_CLINIC_OR_DEPARTMENT_OTHER): Payer: Medicare Other | Admitting: Lab

## 2012-08-19 LAB — CBC WITH DIFFERENTIAL/PLATELET
Basophils Absolute: 0 10*3/uL (ref 0.0–0.1)
EOS%: 3.7 % (ref 0.0–7.0)
HCT: 38.7 % (ref 38.4–49.9)
HGB: 13.7 g/dL (ref 13.0–17.1)
MCH: 33.3 pg (ref 27.2–33.4)
MCV: 94.2 fL (ref 79.3–98.0)
MONO%: 9.2 % (ref 0.0–14.0)
NEUT%: 59.2 % (ref 39.0–75.0)
RDW: 13.2 % (ref 11.0–14.6)

## 2012-09-01 ENCOUNTER — Ambulatory Visit: Payer: Medicare Other | Admitting: Surgery

## 2012-09-15 ENCOUNTER — Ambulatory Visit: Payer: Medicare Other | Admitting: Surgery

## 2012-10-10 ENCOUNTER — Encounter: Payer: Self-pay | Admitting: Surgery

## 2012-10-13 ENCOUNTER — Encounter: Payer: Self-pay | Admitting: Surgery

## 2012-10-13 ENCOUNTER — Encounter (INDEPENDENT_AMBULATORY_CARE_PROVIDER_SITE_OTHER): Payer: Medicare Other | Admitting: *Deleted

## 2012-10-13 ENCOUNTER — Ambulatory Visit (INDEPENDENT_AMBULATORY_CARE_PROVIDER_SITE_OTHER): Payer: Medicare Other | Admitting: Surgery

## 2012-10-13 VITALS — BP 129/85 | HR 80 | Ht 73.0 in | Wt 211.0 lb

## 2012-10-13 DIAGNOSIS — I714 Abdominal aortic aneurysm, without rupture, unspecified: Secondary | ICD-10-CM

## 2012-10-13 DIAGNOSIS — Z48812 Encounter for surgical aftercare following surgery on the circulatory system: Secondary | ICD-10-CM

## 2012-10-13 NOTE — Progress Notes (Signed)
[facility]   Patient name: Eugene [name] MRN: [id number] DOB: 1936/01/19 Sex: male     Chief Complaint  Patient presents with  . AAA    1 year f/u - pt has no complaints    HISTORY OF PRESENT ILLNESS: The patient is back today for followup. He is a former patient of Dr. Madilyn Fireman. He is status post endovascular aneurysm repair on 06/29/2007 using an Endologix device. He is back today for followup. He has no complaints. He denies symptoms of claudication. He has no neurologic symptoms.   Past Medical History  Diagnosis Date  . Hypertension   . Hyperlipidemia   . Hemochromatosis   . Atrial fibrillation   . AAA (abdominal aortic aneurysm) 06/29/2007    stent graft  . Stroke     Transient Ischemic Attack  . Irritable bowel syndrome     Past Surgical History  Procedure Laterality Date  . Endovascular stent insertion  07/18/2007    saccular infrarenal aortic aneurysm  . Abdominal aortic aneurysm repair      History   Social History  . Marital Status: Married    Spouse Name: N/A    Number of Children: N/A  . Years of Education: N/A   Occupational History  . Not on file.   Social History Main Topics  . Smoking status: Former Smoker    Types: Cigarettes    Quit date: 06/18/1999  . Smokeless tobacco: Not on file  . Alcohol Use: 0.0 oz/week    1-2 drink(s) per week  . Drug Use: No  . Sexually Active: Not on file   Other Topics Concern  . Not on file   Social History Narrative  . No narrative on file    Family History  Problem Relation Age of Onset  . Hypertension Mother   . Heart disease Mother     AAA, he passed in his 50 s of Heart Diseasse  . Heart disease Father     AAA history and he passed at age 69  of   Disease    Allergies as of 10/13/2012  . (No Known Allergies)    Current Outpatient Prescriptions on File Prior to Visit  Medication Sig Dispense Refill  . AMLODIPINE BESYLATE PO Take 10 mg by mouth.      .  Ascorbic Acid (VITAMIN C) 100 MG tablet Take 100 mg by mouth daily.      Marland Kitchen aspirin 81 MG tablet Take 81 mg by mouth daily.      Marland Kitchen CIALIS 20 MG tablet       . dabigatran (PRADAXA) 150 MG CAPS Take 150 mg by mouth every 12 (twelve) hours.      Marland Kitchen esomeprazole (NEXIUM) 40 MG capsule Take 40 mg by mouth daily before breakfast.      . hydrochlorothiazide (HYDRODIURIL) 25 MG tablet Take 25 mg by mouth daily.      . hyoscyamine (LEVBID) 0.375 MG 12 hr tablet Take 0.375 mg by mouth every 12 (twelve) hours as needed.      Marland Kitchen KLOR-CON 10 10 MEQ tablet       . LORazepam (ATIVAN) 0.5 MG tablet Take 0.5 mg by mouth every 8 (eight) hours.      . metoprolol succinate (TOPROL-XL) 25 MG 24 hr tablet Take 25 mg by mouth daily.      . ramipril (ALTACE) 1.25 MG capsule Take 5 mg by mouth daily.      . rosuvastatin (CRESTOR) 10  MG tablet Take 10 mg by mouth daily.      Marland Kitchen thiamine (VITAMIN B-1) 100 MG tablet Take 250 mg by mouth daily.      . vitamin E 100 UNIT capsule Take 100 Units by mouth daily.       No current facility-administered medications on file prior to visit.     REVIEW OF SYSTEMS: No changes since prior visit  PHYSICAL EXAMINATION:   Vital signs are BP 129/85  Pulse 80  Ht 6\' 1"  (1.854 m)  Wt 211 lb (95.709 kg)  BMI 27.84 kg/m2  SpO2 100% General: The patient appears their stated age. HEENT:  No gross abnormalities Pulmonary:  Non labored breathing Abdomen: Soft and non-tender Musculoskeletal: There are no major deformities. Neurologic: No focal weakness or paresthesias are detected, Skin: There are no ulcer or rashes noted. Psychiatric: The patient has normal affect. Cardiovascular: There is a regular rate and rhythm without significant murmur appreciated.   Diagnostic Studies Duplex ultrasound was ordered and reviewed today this reveals an aneurysm diameter of 2.51 cm which is essentially unchanged from his prior exam last year.  Assessment: Status post endovascular aneurysm  repair Plan: The patient continues to do very well. He will follow up in one year with a repeat ultrasound for surveillance  V. Charlena Cross, M.D. [facility] Office: [phone] Pager:  [phone]

## 2012-10-14 NOTE — Addendum Note (Signed)
Addended by: Sharee Pimple on: 10/14/2012 09:41 AM   Modules accepted: Orders

## 2012-11-11 ENCOUNTER — Other Ambulatory Visit: Payer: Self-pay | Admitting: Surgery

## 2012-12-04 ENCOUNTER — Other Ambulatory Visit: Payer: Self-pay | Admitting: Surgery

## 2013-02-23 ENCOUNTER — Other Ambulatory Visit: Payer: Self-pay | Admitting: *Deleted

## 2013-02-24 ENCOUNTER — Other Ambulatory Visit (HOSPITAL_BASED_OUTPATIENT_CLINIC_OR_DEPARTMENT_OTHER): Payer: Medicare Other | Admitting: Lab

## 2013-02-24 LAB — CBC WITH DIFFERENTIAL/PLATELET
BASO%: 0.3 % (ref 0.0–2.0)
LYMPH%: 28.8 % (ref 14.0–49.0)
MCH: 33.1 pg (ref 27.2–33.4)
MCHC: 35.6 g/dL (ref 32.0–36.0)
MCV: 93 fL (ref 79.3–98.0)
MONO%: 7.4 % (ref 0.0–14.0)
Platelets: 184 10*3/uL (ref 140–400)
RBC: 4.02 10*6/uL — ABNORMAL LOW (ref 4.20–5.82)

## 2013-02-24 LAB — FERRITIN CHCC: Ferritin: 122 ng/ml (ref 22–316)

## 2013-02-28 ENCOUNTER — Other Ambulatory Visit: Payer: Self-pay | Admitting: Cardiology

## 2013-03-02 ENCOUNTER — Other Ambulatory Visit: Payer: Self-pay | Admitting: Cardiology

## 2013-03-03 ENCOUNTER — Telehealth: Payer: Self-pay | Admitting: *Deleted

## 2013-03-03 ENCOUNTER — Ambulatory Visit (HOSPITAL_BASED_OUTPATIENT_CLINIC_OR_DEPARTMENT_OTHER): Payer: Medicare Other | Admitting: Oncology

## 2013-03-03 DIAGNOSIS — I4891 Unspecified atrial fibrillation: Secondary | ICD-10-CM

## 2013-03-03 NOTE — Progress Notes (Signed)
ID: [name]   DOB: 07/27/1935  MR#: [id number]  NWG#:[id number]  PCP: [name], MD SU:  OTHER MD: Hillery Aldo, Traci [name]   HISTORY OF PRESENT ILLNESS: [name]'s hemochromatosis was documented by labs and genetics in 1989. His family (sister, children) have been tested and all are negative for the mutation  INTERVAL HISTORY: [name] returns today for followup of his hemochromatosis. The interval history is generally unremarkable. He and his wife Waynetta Sandy have gone to the beach several times already this year. He does a little bit of gardening, and some walking for exercise. Overall family is doing well. He finally did sell his old house.  REVIEW OF SYSTEMS: He is atrial fibrillation and is essentially asymptomatic. He is not aware of when his heart rhythm is "off". Very rarely gets a little "foggy" for about 30 seconds at a time. He tells me this may happen once a year. It resolves without any intervention. He continues to have issues with irritable bowel, which Dr. Loreta Ave follows. His had no bleeding, rash, unexplained weight loss or unexplained fatigue. Overall a detailed review of systems today is entirely stable  PAST MEDICAL HISTORY: Past Medical History  Diagnosis Date  . Hypertension   . Hyperlipidemia   . Hemochromatosis   . Atrial fibrillation   . AAA (abdominal aortic aneurysm) 06/29/2007    stent graft  . Stroke     Transient Ischemic Attack  . Irritable bowel syndrome     PAST SURGICAL HISTORY: Past Surgical History  Procedure Laterality Date  . Endovascular stent insertion  07/18/2007    saccular infrarenal aortic aneurysm  . Abdominal aortic aneurysm repair      FAMILY HISTORY Family History  Problem Relation Age of Onset  . Hypertension Mother   . Heart disease Mother     AAA, he passed in his 6 s of Heart Diseasse  . Heart disease Father     AAA history and he passed at age 60  of   Disease   the patient's father died from a myocardial infarction at age 67.  The patient's mother died from lung problems at age 34. He had no brothers, one sister. There is no history of hemochromatosis in the family to his knowledge.  SOCIAL HISTORY: [name] has 2 children from his first marriage, [name], who is a Runner, broadcasting/film/video in [place], with one adopted child, and Rodrecus Belsky, who works in Engineer, materials, and has one biologic and one adopted child. [name]'s second wife, [name], who was my patient, had 2 children from her prior marriage, and [name]'s current wife, [name], also has 2 children of her own. [name] and [name] married in 2007. The patient attends a [employer].   ADVANCED DIRECTIVES: not in place  HEALTH MAINTENANCE: History  Substance Use Topics  . Smoking status: Former Smoker    Types: Cigarettes    Quit date: 06/18/1999  . Smokeless tobacco: Not on file  . Alcohol Use: 0.0 oz/week    1-2 drink(s) per week    No Known Allergies  Current Outpatient Prescriptions  Medication Sig Dispense Refill  . amLODipine (NORVASC) 10 MG tablet TAKE 1 TABLET EVERY DAY  30 tablet  11  . AMLODIPINE BESYLATE PO Take 10 mg by mouth.      . Ascorbic Acid (VITAMIN C) 100 MG tablet Take 100 mg by mouth daily.      Marland Kitchen aspirin 81 MG tablet Take 81 mg by mouth daily.      Marland Kitchen CIALIS  20 MG tablet       . dabigatran (PRADAXA) 150 MG CAPS Take 150 mg by mouth every 12 (twelve) hours.      Marland Kitchen esomeprazole (NEXIUM) 40 MG capsule Take 40 mg by mouth daily before breakfast.      . hydrochlorothiazide (HYDRODIURIL) 25 MG tablet Take 25 mg by mouth daily.      . hyoscyamine (LEVBID) 0.375 MG 12 hr tablet Take 0.375 mg by mouth every 12 (twelve) hours as needed.      Marland Kitchen KLOR-CON 10 10 MEQ tablet       . LORazepam (ATIVAN) 0.5 MG tablet Take 0.5 mg by mouth every 8 (eight) hours.      . metoprolol succinate (TOPROL-XL) 25 MG 24 hr tablet Take 25 mg by mouth daily.      . ramipril (ALTACE) 1.25 MG capsule Take 5 mg by mouth daily.      . rosuvastatin (CRESTOR) 10 MG  tablet Take 10 mg by mouth daily.      Marland Kitchen thiamine (VITAMIN B-1) 100 MG tablet Take 250 mg by mouth daily.      . vitamin E 100 UNIT capsule Take 100 Units by mouth daily.       No current facility-administered medications for this visit.    OBJECTIVE: Middle-aged white male in no acute distress Filed Vitals:   03/03/13 1124  BP: 137/82  Pulse: 78  Temp: 97.5 F (36.4 C)  Resp: 19     Body mass index is 28.07 kg/(m^2).    ECOG FS: 1  Sclerae unicteric, pupils equal round and reactive Oropharynx no lesions or thrush No cervical or supraclavicular adenopathy Lungs no rales or rhonchi Heart regular rate and rhythm, no murmur appreciated, no irregularity noted Abd soft, nontender, positive bowel sounds, no organnomegaly MSK no focal spinal tenderness Neuro: nonfocal, well oriented, pleasant affect   LAB RESULTS: Results for KALIEL, BOLDS (MRN [id number]) as of 03/03/2013 11:22  Ref. Range 10/03/2011 14:07 10/17/2011 12:34 02/19/2012 13:20 08/19/2012 11:34 02/24/2013 11:20  Ferritin Latest Range: 22-322 ng/mL 88 56 76 126 122    Lab Results  Component Value Date   WBC 6.1 02/24/2013   NEUTROABS 3.7 02/24/2013   HGB 13.3 02/24/2013   HCT 37.4* 02/24/2013   MCV 93.0 02/24/2013   PLT 184 02/24/2013      Chemistry      Component Value Date/Time   NA 131* 02/15/2011 1325   K 3.6 02/15/2011 1325   CL 97 02/15/2011 1325   CO2 23 02/15/2011 1325   BUN 9 02/15/2011 1325   CREATININE 0.89 02/15/2011 1325      Component Value Date/Time   CALCIUM 9.1 02/15/2011 1325   ALKPHOS 40 02/15/2011 1325   AST 22 02/15/2011 1325   ALT 17 02/15/2011 1325   BILITOT 1.7* 02/15/2011 1325       No results found for this basename: LABCA2    No components found with this basename: LABCA125    No results found for this basename: INR,  in the last 168 hours  Urinalysis    Component Value Date/Time   COLORURINE YELLOW 07/15/2007 1041   APPEARANCEUR CLEAR 07/15/2007 1041   LABSPEC 1.007 07/15/2007  1041   PHURINE 7.5 07/15/2007 1041   GLUCOSEU NEGATIVE 07/15/2007 1041   HGBUR TRACE* 07/15/2007 1041   BILIRUBINUR NEGATIVE 07/15/2007 1041   KETONESUR NEGATIVE 07/15/2007 1041   PROTEINUR NEGATIVE 07/15/2007 1041   UROBILINOGEN 0.2 07/15/2007 1041   NITRITE NEGATIVE 07/15/2007 1041  LEUKOCYTESUR NEGATIVE 07/15/2007 1041    STUDIES: No results found.   ASSESSMENT: 77 y.o. [place] man with a history of hemochromatosis diagnosed in 1989, treated with intermittent phlebotomies.   PLAN: [name]'s ferritin actually went down slightly. He understands this test note only reflects iron stores but also is an acute phase reactant. I suspect his prior reading was due to slight inflammation or for some minor occult reason. At any rate my expectation is that by 3-6 months from now he will need phlebotomy.  When we decided to phlebotomize her next we will place a PICC line and, because axis was very difficult last time. We will also investigate whether [facility] continues his blood so that it isn't simply wasted as we would do here.  His next lab work will be may January. We will repeat lab work every 3 months. He will see me again in a year. He knows to call for any problems that may develop before his next visit here.   Tyrece Vanterpool C    03/03/2013

## 2013-03-03 NOTE — Telephone Encounter (Signed)
appts made and printed...td 

## 2013-03-04 NOTE — Addendum Note (Signed)
Addended by: Laroy Apple E on: 03/04/2013 02:03 PM   Modules accepted: Orders

## 2013-03-25 ENCOUNTER — Other Ambulatory Visit: Payer: Self-pay | Admitting: Internal Medicine

## 2013-03-25 DIAGNOSIS — R109 Unspecified abdominal pain: Secondary | ICD-10-CM

## 2013-03-26 ENCOUNTER — Ambulatory Visit
Admission: RE | Admit: 2013-03-26 | Discharge: 2013-03-26 | Disposition: A | Discharge status: Home / Self Care | Payer: Medicare Other | Source: Ambulatory Visit | Attending: Internal Medicine | Admitting: Internal Medicine

## 2013-03-26 DIAGNOSIS — R109 Unspecified abdominal pain: Secondary | ICD-10-CM

## 2013-06-09 ENCOUNTER — Encounter (INDEPENDENT_AMBULATORY_CARE_PROVIDER_SITE_OTHER): Payer: Self-pay

## 2013-06-09 ENCOUNTER — Other Ambulatory Visit (HOSPITAL_BASED_OUTPATIENT_CLINIC_OR_DEPARTMENT_OTHER): Payer: Medicare Other

## 2013-06-09 LAB — CBC WITH DIFFERENTIAL/PLATELET
BASO%: 0.7 % (ref 0.0–2.0)
Basophils Absolute: 0 10*3/uL (ref 0.0–0.1)
EOS%: 3.9 % (ref 0.0–7.0)
Eosinophils Absolute: 0.3 10*3/uL (ref 0.0–0.5)
HCT: 41.2 % (ref 38.4–49.9)
HGB: 14.6 g/dL (ref 13.0–17.1)
LYMPH%: 25.6 % (ref 14.0–49.0)
MCH: 34.4 pg — AB (ref 27.2–33.4)
MCHC: 35.4 g/dL (ref 32.0–36.0)
MCV: 97.1 fL (ref 79.3–98.0)
MONO#: 0.7 10*3/uL (ref 0.1–0.9)
MONO%: 10.4 % (ref 0.0–14.0)
NEUT%: 59.4 % (ref 39.0–75.0)
NEUTROS ABS: 4.1 10*3/uL (ref 1.5–6.5)
Platelets: 201 10*3/uL (ref 140–400)
RBC: 4.24 10*6/uL (ref 4.20–5.82)
RDW: 13.8 % (ref 11.0–14.6)
WBC: 6.9 10*3/uL (ref 4.0–10.3)
lymph#: 1.8 10*3/uL (ref 0.9–3.3)

## 2013-06-09 LAB — FERRITIN CHCC: Ferritin: 142 ng/ml (ref 22–316)

## 2013-06-10 ENCOUNTER — Encounter: Payer: Self-pay | Admitting: Oncology

## 2013-07-20 ENCOUNTER — Encounter: Payer: Self-pay | Admitting: General Surgery

## 2013-07-20 ENCOUNTER — Ambulatory Visit (INDEPENDENT_AMBULATORY_CARE_PROVIDER_SITE_OTHER): Payer: Medicare Other | Admitting: *Deleted

## 2013-07-20 DIAGNOSIS — I714 Abdominal aortic aneurysm, without rupture, unspecified: Secondary | ICD-10-CM

## 2013-07-20 DIAGNOSIS — I6529 Occlusion and stenosis of unspecified carotid artery: Secondary | ICD-10-CM

## 2013-07-20 DIAGNOSIS — Z48812 Encounter for surgical aftercare following surgery on the circulatory system: Secondary | ICD-10-CM

## 2013-07-20 LAB — CBC WITH DIFFERENTIAL/PLATELET
BASOS PCT: 0.6 % (ref 0.0–3.0)
Basophils Absolute: 0 10*3/uL (ref 0.0–0.1)
Eosinophils Absolute: 0.2 10*3/uL (ref 0.0–0.7)
Eosinophils Relative: 3.8 % (ref 0.0–5.0)
HCT: 42.5 % (ref 39.0–52.0)
HEMOGLOBIN: 14 g/dL (ref 13.0–17.0)
Lymphocytes Relative: 29.2 % (ref 12.0–46.0)
Lymphs Abs: 1.8 10*3/uL (ref 0.7–4.0)
MCHC: 33 g/dL (ref 30.0–36.0)
MCV: 100.9 fl — AB (ref 78.0–100.0)
MONO ABS: 0.7 10*3/uL (ref 0.1–1.0)
Monocytes Relative: 10.4 % (ref 3.0–12.0)
Neutro Abs: 3.5 10*3/uL (ref 1.4–7.7)
Neutrophils Relative %: 56 % (ref 43.0–77.0)
Platelets: 210 10*3/uL (ref 150.0–400.0)
RBC: 4.21 Mil/uL — AB (ref 4.22–5.81)
RDW: 13.9 % (ref 11.5–14.6)
WBC: 6.3 10*3/uL (ref 4.5–10.5)

## 2013-07-20 LAB — BASIC METABOLIC PANEL
BUN: 14 mg/dL (ref 6–23)
CHLORIDE: 100 meq/L (ref 96–112)
CO2: 27 mEq/L (ref 19–32)
Calcium: 10.2 mg/dL (ref 8.4–10.5)
Creatinine, Ser: 1.1 mg/dL (ref 0.4–1.5)
GFR: 72.98 mL/min (ref >60.00)
Glucose, Bld: 94 mg/dL (ref 70–99)
POTASSIUM: 3.6 meq/L (ref 3.5–5.1)
Sodium: 137 mEq/L (ref 135–145)

## 2013-09-01 ENCOUNTER — Other Ambulatory Visit (HOSPITAL_BASED_OUTPATIENT_CLINIC_OR_DEPARTMENT_OTHER): Payer: Medicare Other

## 2013-09-01 LAB — CBC WITH DIFFERENTIAL/PLATELET
BASO%: 1.1 % (ref 0.0–2.0)
Basophils Absolute: 0.1 10*3/uL (ref 0.0–0.1)
EOS ABS: 0.2 10*3/uL (ref 0.0–0.5)
EOS%: 3.2 % (ref 0.0–7.0)
HCT: 39.5 % (ref 38.4–49.9)
HGB: 13.8 g/dL (ref 13.0–17.1)
LYMPH%: 24.9 % (ref 14.0–49.0)
MCH: 33.6 pg — ABNORMAL HIGH (ref 27.2–33.4)
MCHC: 35 g/dL (ref 32.0–36.0)
MCV: 95.9 fL (ref 79.3–98.0)
MONO#: 0.7 10*3/uL (ref 0.1–0.9)
MONO%: 9.4 % (ref 0.0–14.0)
NEUT#: 4.5 10*3/uL (ref 1.5–6.5)
NEUT%: 61.4 % (ref 39.0–75.0)
PLATELETS: 179 10*3/uL (ref 140–400)
RBC: 4.12 10*6/uL — AB (ref 4.20–5.82)
RDW: 13.6 % (ref 11.0–14.6)
WBC: 7.3 10*3/uL (ref 4.0–10.3)
lymph#: 1.8 10*3/uL (ref 0.9–3.3)

## 2013-09-01 LAB — FERRITIN CHCC: Ferritin: 131 ng/ml (ref 22–316)

## 2013-10-26 ENCOUNTER — Other Ambulatory Visit (HOSPITAL_COMMUNITY): Payer: Medicare Other

## 2013-10-26 ENCOUNTER — Ambulatory Visit: Payer: Medicare Other | Admitting: Family

## 2013-10-27 ENCOUNTER — Encounter: Payer: Self-pay | Admitting: Family

## 2013-10-28 ENCOUNTER — Encounter: Payer: Self-pay | Admitting: Family

## 2013-10-28 ENCOUNTER — Ambulatory Visit (HOSPITAL_COMMUNITY)
Admission: RE | Admit: 2013-10-28 | Discharge: 2013-10-28 | Disposition: A | Discharge status: Home / Self Care | Payer: Medicare Other | Source: Ambulatory Visit | Attending: Family | Admitting: Family

## 2013-10-28 ENCOUNTER — Ambulatory Visit (INDEPENDENT_AMBULATORY_CARE_PROVIDER_SITE_OTHER): Payer: Medicare Other | Admitting: Family

## 2013-10-28 VITALS — BP 124/80 | HR 60 | Resp 16 | Ht 73.0 in | Wt 210.0 lb

## 2013-10-28 DIAGNOSIS — I714 Abdominal aortic aneurysm, without rupture, unspecified: Secondary | ICD-10-CM

## 2013-10-28 DIAGNOSIS — Z48812 Encounter for surgical aftercare following surgery on the circulatory system: Secondary | ICD-10-CM

## 2013-10-28 NOTE — Progress Notes (Signed)
[facility]  Established [facility]  History of Present Illness  Eugene [name] is a 78 y.o. (February 05, 1936) male patient of Dr. Myra GianottiBrabham. He is a former patient of Dr. Madilyn FiremanHayes. He is status post endovascular aneurysm repair on 06/29/2007 using an Endologix device. He is back today for followup. Most recent EVAR duplex (Date: 10/13/12) demonstrates: no endoleak and 2.51 sac size.   Most recent CTA (Date: 08/28/2007) demonstrates:Endograft stent in place infrarenally within the  abdominal aorta. The aneurysm sac size currently measures 3.8 cm  compared to of 4.2 cm maximally previously. No evidence of  endoleak. Renal arteries are widely patent. IMA is occluded, with  branch filling collaterally via SMA collaterals. The stent graft ends in the common iliac arteries  bilaterally which are heavily calcified and tortuous. No evidence  of aneurysm or significant stenosis. Heavily calcified internal  iliac arteries noted, patent. Mild disease within the common  femoral arteries bilaterally.    The patient denies back or abdominal pain. Pt states he is physically active. 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  Pt Diabetic: No Pt smoker: former smoker, quit in 2001 He takes Pradaxa for atrial fib. Pt denies claudication symptoms in his legs with walking. He had a stroke in 2001 as manifested by right eye limited movement; he then had several TIA's afterward as anticipated by his cardiologist, his last TIA was about 6-8 months ago.  Pt states his cardiologist is checking his carotid arteries by US.   Past Medical History  Diagnosis Date  . Hypertension   . Hyperlipidemia   . Hemochromatosis   . Atrial fibrillation   . AAA (abdominal aortic aneurysm) 06/29/2007    stent graft  . Stroke     Transient Ischemic Attack  . Irritable bowel syndrome    Past  Surgical History  Procedure Laterality Date  . Endovascular stent insertion  07/18/2007    saccular infrarenal aortic aneurysm  . Abdominal aortic aneurysm repair     Social History History  Substance Use Topics  . Smoking status: Former Smoker    Types: Cigarettes    Quit date: 06/18/1999  . Smokeless tobacco: Not on file  . Alcohol Use: 0.0 oz/week    1-2 drink(s) per week   Family History Family History  Problem Relation Age of Onset  . Hypertension Mother   . Heart disease Mother     AAA, he passed in his 5980 s of Heart Diseasse  . Heart disease Father     AAA history and he passed at age 78  of   Disease   Current Outpatient Prescriptions on File Prior to Visit  Medication Sig Dispense Refill  . amLODipine (NORVASC) 10 MG tablet TAKE 1 TABLET EVERY DAY  30 tablet  11  . AMLODIPINE BESYLATE PO Take 10 mg by mouth.      . Ascorbic Acid (VITAMIN C) 100 MG tablet Take 100 mg by mouth daily.      Marland Kitchen. CIALIS 20 MG tablet       . dabigatran (PRADAXA) 150 MG CAPS Take 150 mg by mouth every 12 (twelve) hours.      Marland Kitchen. esomeprazole (NEXIUM) 40 MG capsule Take 40 mg by mouth daily before breakfast.      . hydrochlorothiazide (HYDRODIURIL) 25 MG tablet Take 25 mg by mouth daily.      . hyoscyamine (  LEVBID) 0.375 MG 12 hr tablet Take 0.375 mg by mouth every 12 (twelve) hours as needed.      Marland Kitchen KLOR-CON 10 10 MEQ tablet       . LORazepam (ATIVAN) 0.5 MG tablet Take 0.5 mg by mouth every 8 (eight) hours.      . metoprolol succinate (TOPROL-XL) 25 MG 24 hr tablet Take 25 mg by mouth daily.      . ramipril (ALTACE) 1.25 MG capsule Take 5 mg by mouth daily.      . rosuvastatin (CRESTOR) 10 MG tablet Take 10 mg by mouth daily.      Marland Kitchen thiamine (VITAMIN B-1) 100 MG tablet Take 250 mg by mouth daily.      . vitamin E 100 UNIT capsule Take 100 Units by mouth daily.       No current facility-administered medications on file prior to visit.   No Known Allergies   ROS: See HPI for pertinent  positives and negatives.  Physical Examination  Filed Vitals:   10/28/13 0909  BP: 124/80  Pulse: 60  Resp: 16  Height: 6\' 1"  (1.854 m)  Weight: 210 lb (95.255 kg)  SpO2: 99%   Body mass index is 27.71 kg/(m^2).  General: A&O x 3, WD.  Pulmonary: Sym exp, good air movt, CTAB, no rales, rhonchi, or  wheezing.   Cardiac: RRR, Nl S1, S2, no Murmur detected.  Vascular: Vessel Right Left  Radial 2+Palpable 2+Palpable  Carotid without bruit  without bruit  Aorta Not palpable N/A  Popliteal Not palpable Not palpable  PT 1+Palpable 1+Palpable  DP 2+Palpable 3+Palpable   Gastrointestinal: soft, NTND, -G/R, - HSM, - masses, - CVAT B  Musculoskeletal: M/S 5/5 throughout, Extremities without ischemic changes  Neurologic: Pain and light touch intact in extremities, CN 2-12 intact, Motor exam as listed above  Non-Invasive Vascular Imaging  EVAR Duplex (Date: 10/28/2013)  AAA sac size: 2.5 cm x 2.7 cm  no endoleak detected  Medical Decision Making  Eugene [name] is a 78 y.o. male who presents s/p EVAR.  Pt is asymptomatic with decrease in sac size.  I discussed with the patient the importance of surveillance of the endograft.  The next endograft duplex will be scheduled for 12 months.  The patient will follow up with [place] in 12 months with these studies.  I emphasized the importance of maximal medical management including strict control of blood pressure, blood glucose, and lipid levels, antiplatelet agents, obtaining regular exercise, and cessation of smoking.   The patient was given information about AAA including signs, symptoms, treatment, and how to minimize the risk of enlargement and rupture of aneurysms.    Thank you for allowing [place] to participate in this patient's care.  Charisse March, RN, MSN, FNP-C [facility] Office: [phone]  Clinic Physician: Edilia Bo  10/28/2013, 9:07 AM

## 2013-10-28 NOTE — Patient Instructions (Signed)

## 2013-11-17 ENCOUNTER — Other Ambulatory Visit: Payer: Self-pay | Admitting: Physician Assistant

## 2013-11-24 ENCOUNTER — Other Ambulatory Visit (HOSPITAL_BASED_OUTPATIENT_CLINIC_OR_DEPARTMENT_OTHER): Payer: Medicare Other

## 2013-11-24 LAB — CBC WITH DIFFERENTIAL/PLATELET
BASO%: 0.6 % (ref 0.0–2.0)
Basophils Absolute: 0 10*3/uL (ref 0.0–0.1)
EOS%: 2.8 % (ref 0.0–7.0)
Eosinophils Absolute: 0.2 10*3/uL (ref 0.0–0.5)
HEMATOCRIT: 38.7 % (ref 38.4–49.9)
HGB: 13.4 g/dL (ref 13.0–17.1)
LYMPH%: 23.4 % (ref 14.0–49.0)
MCH: 33.7 pg — AB (ref 27.2–33.4)
MCHC: 34.7 g/dL (ref 32.0–36.0)
MCV: 97.1 fL (ref 79.3–98.0)
MONO#: 0.8 10*3/uL (ref 0.1–0.9)
MONO%: 10.5 % (ref 0.0–14.0)
NEUT#: 4.5 10*3/uL (ref 1.5–6.5)
NEUT%: 62.7 % (ref 39.0–75.0)
Platelets: 202 10*3/uL (ref 140–400)
RBC: 3.99 10*6/uL — ABNORMAL LOW (ref 4.20–5.82)
RDW: 13.9 % (ref 11.0–14.6)
WBC: 7.2 10*3/uL (ref 4.0–10.3)
lymph#: 1.7 10*3/uL (ref 0.9–3.3)

## 2013-11-24 LAB — FERRITIN CHCC: Ferritin: 141 ng/ml (ref 22–316)

## 2013-11-30 ENCOUNTER — Telehealth: Payer: Self-pay | Admitting: *Deleted

## 2013-11-30 NOTE — Telephone Encounter (Signed)
This RN spoke with pt per his call to follow up on ferritan level and need for phlebotomies.  Noted level of 141 with prior 3 months ago as 132.  Rosanne AshingJim will follow up in 3 months ( appt rescheduled per phone discussion ). Note discussed obtaining PICC line and Rosanne AshingJim is inquiring if phlebotomies could be done 2 x week to reach desired level.  The above will be given to MD for review.

## 2013-12-29 ENCOUNTER — Ambulatory Visit: Payer: Medicare Other | Admitting: Cardiology

## 2014-01-04 ENCOUNTER — Ambulatory Visit: Payer: Medicare Other | Admitting: Cardiology

## 2014-02-09 ENCOUNTER — Other Ambulatory Visit (HOSPITAL_BASED_OUTPATIENT_CLINIC_OR_DEPARTMENT_OTHER): Payer: Medicare Other

## 2014-02-09 ENCOUNTER — Telehealth: Payer: Self-pay | Admitting: Oncology

## 2014-02-09 ENCOUNTER — Ambulatory Visit: Payer: Medicare Other | Admitting: Oncology

## 2014-02-09 ENCOUNTER — Ambulatory Visit (HOSPITAL_BASED_OUTPATIENT_CLINIC_OR_DEPARTMENT_OTHER): Payer: Medicare Other | Admitting: Oncology

## 2014-02-09 DIAGNOSIS — I4891 Unspecified atrial fibrillation: Secondary | ICD-10-CM

## 2014-02-09 DIAGNOSIS — K589 Irritable bowel syndrome without diarrhea: Secondary | ICD-10-CM

## 2014-02-09 DIAGNOSIS — I714 Abdominal aortic aneurysm, without rupture, unspecified: Secondary | ICD-10-CM

## 2014-02-09 LAB — CBC WITH DIFFERENTIAL/PLATELET
BASO%: 0.3 % (ref 0.0–2.0)
Basophils Absolute: 0 10*3/uL (ref 0.0–0.1)
EOS%: 3.3 % (ref 0.0–7.0)
Eosinophils Absolute: 0.2 10*3/uL (ref 0.0–0.5)
HEMATOCRIT: 38.6 % (ref 38.4–49.9)
HGB: 13.7 g/dL (ref 13.0–17.1)
LYMPH%: 24.7 % (ref 14.0–49.0)
MCH: 33 pg (ref 27.2–33.4)
MCHC: 35.5 g/dL (ref 32.0–36.0)
MCV: 93 fL (ref 79.3–98.0)
MONO#: 0.4 10*3/uL (ref 0.1–0.9)
MONO%: 6.7 % (ref 0.0–14.0)
NEUT#: 4.2 10*3/uL (ref 1.5–6.5)
NEUT%: 65 % (ref 39.0–75.0)
PLATELETS: 184 10*3/uL (ref 140–400)
RBC: 4.15 10*6/uL — AB (ref 4.20–5.82)
RDW: 13 % (ref 11.0–14.6)
WBC: 6.4 10*3/uL (ref 4.0–10.3)
lymph#: 1.6 10*3/uL (ref 0.9–3.3)

## 2014-02-09 LAB — FERRITIN CHCC: Ferritin: 202 ng/ml (ref 22–316)

## 2014-02-09 NOTE — Progress Notes (Signed)
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 His family (sister, children) have been tested and all are negative for the mutation  INTERVAL HISTORY: Eugene [name] returns today for followup of his hemochromatosis. The interval history is generally unremarkable. His wife are planning a trip to the beach in the next few days. Taking all the grandchildren together, they have about 10 grandchildren. She exercises regularly chiefly by walking  REVIEW OF SYSTEMS: Eugene [name] has a variety of other medical problems including atrial fibrillation, intermittent but intense problems with irritable bowel, and of course his abdominal aortic aneurysm which as been stented and is stable. Aside from arthritis symptoms, which are not more persistent or intense than before, a detailed review of systems today was entirely unremarkable  PAST MEDICAL HISTORY: Past Medical History  Diagnosis Date  . Hypertension   . Hyperlipidemia   . Hemochromatosis   . Atrial fibrillation   . AAA (abdominal aortic aneurysm) 06/29/2007    stent graft  . Stroke     Transient Ischemic Attack  . Irritable bowel syndrome     PAST SURGICAL HISTORY: Past Surgical History  Procedure Laterality Date  . Endovascular stent insertion  07/18/2007    saccular infrarenal aortic aneurysm  . Abdominal aortic aneurysm repair      FAMILY HISTORY Family History  Problem Relation Age of Onset  . Hypertension Mother   . Heart disease Mother     AAA, he passed in his 67 s of Heart Diseasse  . Heart disease Father     AAA history and he passed at age 71  of   Disease   the patient's father died from a myocardial infarction at age 21. The patient's mother died from lung problems at age 62. He had no brothers, one sister. There is no history of  hemochromatosis in the family to his knowledge.  SOCIAL HISTORY: Eugene [name] has 2 children from his first marriage, Eugene [name], who is a Runner, broadcasting/film/video in [place], with one adopted child, and Eugene [name], who works in Engineer, materials, and has one biologic and one adopted child. Eugene [name]'s second wife, Eugene [name], who was my patient, had 2 children from her prior marriage, and Eugene [name]'s current wife, Eugene [name], also has 2 children of her own. Eugene [name] and Eugene [name] married in 2007. The patient attends a [employer].   ADVANCED DIRECTIVES: not in place  HEALTH MAINTENANCE: History  Substance Use Topics  . Smoking status: Former Smoker    Types: Cigarettes    Quit date: 06/18/1999  . Smokeless tobacco: Never Used  . Alcohol Use: 0.0 oz/week    1-2 drink(s) per week    No Known Allergies  Current Outpatient Prescriptions  Medication Sig Dispense Refill  . amLODipine (NORVASC) 10 MG tablet TAKE 1 TABLET EVERY DAY  30 tablet  11  . AMLODIPINE BESYLATE PO Take 10 mg by mouth.      . Ascorbic Acid (VITAMIN C) 100 MG tablet Take 100 mg by mouth daily.      Marland Kitchen CIALIS 20 MG tablet       . dabigatran (PRADAXA) 150 MG CAPS Take 150 mg by mouth every 12 (twelve) hours.      Marland Kitchen esomeprazole (NEXIUM) 40 MG capsule Take 40 mg by mouth daily before breakfast.      .  hydrochlorothiazide (HYDRODIURIL) 25 MG tablet Take 25 mg by mouth daily.      . hyoscyamine (LEVBID) 0.375 MG 12 hr tablet Take 0.375 mg by mouth every 12 (twelve) hours as needed.      Marland Kitchen KLOR-CON 10 10 MEQ tablet       . LORazepam (ATIVAN) 0.5 MG tablet Take 0.5 mg by mouth every 8 (eight) hours.      . metoprolol succinate (TOPROL-XL) 25 MG 24 hr tablet Take 25 mg by mouth daily.      . ramipril (ALTACE) 1.25 MG capsule Take 5 mg by mouth daily.      . rosuvastatin (CRESTOR) 10 MG tablet Take 10 mg by mouth daily.      Marland Kitchen thiamine (VITAMIN B-1) 100 MG tablet Take 250 mg by mouth daily.      . vitamin E 100 UNIT capsule Take 100 Units by mouth daily.        No current facility-administered medications for this visit.    OBJECTIVE: Middle-aged white man who appears stated age 78 Vitals:   02/09/14 0921  BP: 138/91  Pulse: 66  Temp: 97.4 F (36.3 C)  Resp: 18     Body mass index is 27.79 kg/(m^2).    ECOG FS: 1  Sclerae unicteric, EOMs intact Oropharynx clear, teeth in good repair No cervical or supraclavicular adenopathy Lungs no rales or rhonchi Heart irregular rate and rhythm, no murmur noted Abd soft, nontender, positive bowel sounds, no organnomegaly MSK no focal spinal tenderness Neuro: nonfocal, well oriented, pleasant affect   LAB RESULTS: Results for VOSHON, PETRO (MRN [id number]) as of 02/09/2014 09:22  Ref. Range 08/19/2012 11:34 02/24/2013 11:20 06/09/2013 10:35 09/01/2013 10:50 11/24/2013 10:44  Ferritin Latest Range: 22-322 ng/mL 126 122 142 131 141    Lab Results  Component Value Date   WBC 6.4 02/09/2014   NEUTROABS 4.2 02/09/2014   HGB 13.7 02/09/2014   HCT 38.6 02/09/2014   MCV 93.0 02/09/2014   PLT 184 02/09/2014      Chemistry      Component Value Date/Time   NA 137 07/20/2013 0930   K 3.6 07/20/2013 0930   CL 100 07/20/2013 0930   CO2 27 07/20/2013 0930   BUN 14 07/20/2013 0930   CREATININE 1.1 07/20/2013 0930      Component Value Date/Time   CALCIUM 10.2 07/20/2013 0930   ALKPHOS 40 02/15/2011 1325   AST 22 02/15/2011 1325   ALT 17 02/15/2011 1325   BILITOT 1.7* 02/15/2011 1325       No results found for this basename: LABCA2    No components found with this basename: LABCA125    No results found for this basename: INR,  in the last 168 hours  Urinalysis    Component Value Date/Time   COLORURINE YELLOW 07/15/2007 1041   APPEARANCEUR CLEAR 07/15/2007 1041   LABSPEC 1.007 07/15/2007 1041   PHURINE 7.5 07/15/2007 1041   GLUCOSEU NEGATIVE 07/15/2007 1041   HGBUR TRACE* 07/15/2007 1041   BILIRUBINUR NEGATIVE 07/15/2007 1041   KETONESUR NEGATIVE 07/15/2007 1041   PROTEINUR NEGATIVE 07/15/2007  1041   UROBILINOGEN 0.2 07/15/2007 1041   NITRITE NEGATIVE 07/15/2007 1041   LEUKOCYTESUR NEGATIVE 07/15/2007 1041    STUDIES: abd [place] 10/28/2013 showed no change in his 2.51 aneurysm concern   Clinical Data: Poststent abdominal aortic aneurysm  CT ANGIOGRAPHY OF ABDOMEN AND PELVIS WITHOUT AND/OR WITH CONTRAST -  AAA PROTOCOL  Technique: Multidetector CT imaging of the abdomen and pelvis  was  performed before and during bolus injection of intravenous  contrast. Multiplanar CT angiographic image reconstructions were  also generated to evaluate the vascular structures.  Contrast: 100 ml Omnipaque-350  Comparison: 06/05/2007  CTA ABDOMEN  Findings: Endograft stent in place infrarenally within the  abdominal aorta. The aneurysm sac size currently measures 3.8 cm  compared to of 4.2 cm maximally previously. No evidence of  endoleak. Renal arteries are widely patent. IMA is occluded, with  branch filling collaterally via SMA collaterals.  Stable large simple appearing cyst in the left kidney. The patient  is status post cholecystectomy. Liver, spleen, pancreas, adrenals  unremarkable.  No free fluid, free air, or adenopathy. Diverticular disease seen  throughout the left side of the colon  IMPRESSION:  Post stent graft repair of the infrarenal abdominal aortic  aneurysm. No evidence of endoleak. Aneurysm sac size slightly  decreased.  Prior cholecystectomy.  CTA PELVIS  Findings: The stent graft ends in the common iliac arteries  bilaterally which are heavily calcified and tortuous. No evidence  of aneurysm or significant stenosis. Heavily calcified internal  iliac arteries noted, patent. Mild disease within the common  femoral arteries bilaterally.  Extensive diverticular disease in the sigmoid colon. No free  fluid, free air, or adenopathy.  IMPRESSION:  Stent graft ends in the common iliac arteries bilaterally. No  aneurysm or focal stenosis. No complicating feature.  Sigmoid  diverticulosis.  Provider: Gwynneth Munson        ASSESSMENT: 78 y.o. [place] man with a history of hemochromatosis diagnosed in 1989, treated with intermittent phlebotomies.   (1) also history of abdominal aortic aneurysm, status post stent placement  (2) history of irritable bowel syndrome, intermittently symptomatic  (3) atrial fibrillation  PLAN:  Eugene [name] is having another bout of his irritable bowel problems and Dr. Loreta Ave is actively treating that. We are waiting on his ferritin and I suspect it will be over 150 which means she will need phlebotomies. He is planning to go to the beach for the next 8-10 days. Accordingly, assuming that his ferritin in the has increased over the 150 mark, we will set him up for a PICC line in the left upper extremity and phlebotomy the same day, to be repeated weekly until his ferritin is less than 50.  Otherwise we will continue the every 3 month lab checks and visit once a year. Eugene [name] has a good understanding of the overall plan. He agrees with that. He knows the goal of treatment in his case is control. He will call with any problems that may develop before his next visit here.  MAGRINAT,GUSTAV C    02/09/2014

## 2014-02-09 NOTE — Telephone Encounter (Signed)
, °

## 2014-02-10 ENCOUNTER — Encounter: Payer: Self-pay | Admitting: Cardiology

## 2014-02-10 ENCOUNTER — Ambulatory Visit (INDEPENDENT_AMBULATORY_CARE_PROVIDER_SITE_OTHER): Payer: Medicare Other | Admitting: Cardiology

## 2014-02-10 VITALS — BP 122/84 | HR 80 | Ht 73.0 in | Wt 211.0 lb

## 2014-02-10 DIAGNOSIS — I4821 Permanent atrial fibrillation: Secondary | ICD-10-CM | POA: Insufficient documentation

## 2014-02-10 DIAGNOSIS — I482 Chronic atrial fibrillation, unspecified: Secondary | ICD-10-CM

## 2014-02-10 DIAGNOSIS — I4891 Unspecified atrial fibrillation: Secondary | ICD-10-CM | POA: Insufficient documentation

## 2014-02-10 DIAGNOSIS — I10 Essential (primary) hypertension: Secondary | ICD-10-CM

## 2014-02-10 DIAGNOSIS — K219 Gastro-esophageal reflux disease without esophagitis: Secondary | ICD-10-CM | POA: Insufficient documentation

## 2014-02-10 DIAGNOSIS — I6529 Occlusion and stenosis of unspecified carotid artery: Secondary | ICD-10-CM

## 2014-02-10 NOTE — Progress Notes (Signed)
[address] [place], [place]  [address] Phone: [phone] Fax:  [phone]  Date:  02/10/2014   ID:  Eugene [name], DOB 10-29-1935, MRN [id number]  PCP:  [name], MD  Cardiologist:  Armanda Magic, MD     History of Present Illness: This is a 78yo male with a history of HTN, chronic atrial fibrillation s/p failed DCCV x2 on chronic anticoagulation who presents today for followup.  He is doing well.  He denies any chest pain, SOB, DOE, LE edema, dizziness, palpitations or syncope.  Wt Readings from Last 3 Encounters:  02/10/14 211 lb (95.709 kg)  02/09/14 210 lb 9.6 oz (95.528 kg)  10/28/13 210 lb (95.255 kg)     Past Medical History  Diagnosis Date  . Hypertension   . Hyperlipidemia   . Hemochromatosis   . AAA (abdominal aortic aneurysm) 06/29/2007    stent graft  . Stroke     Transient Ischemic Attack  . Irritable bowel syndrome   . Chronic atrial fibrillation   . GERD (gastroesophageal reflux disease)     Current Outpatient Prescriptions  Medication Sig Dispense Refill  . amLODipine (NORVASC) 10 MG tablet TAKE 1 TABLET EVERY DAY  30 tablet  11  . Ascorbic Acid (VITAMIN C) 100 MG tablet Take 100 mg by mouth daily.      Marland Kitchen CIALIS 20 MG tablet       . dabigatran (PRADAXA) 150 MG CAPS Take 150 mg by mouth every 12 (twelve) hours.      Marland Kitchen esomeprazole (NEXIUM) 40 MG capsule Take 40 mg by mouth daily before breakfast.      . hydrochlorothiazide (HYDRODIURIL) 25 MG tablet Take 25 mg by mouth daily.      . hyoscyamine (LEVBID) 0.375 MG 12 hr tablet Take 0.375 mg by mouth every 12 (twelve) hours as needed.      Marland Kitchen KLOR-CON 10 10 MEQ tablet       . LORazepam (ATIVAN) 0.5 MG tablet Take 0.5 mg by mouth every 8 (eight) hours.      . metoprolol tartrate (LOPRESSOR) 25 MG tablet Take 25 mg by mouth 2 (two) times daily.       . ramipril (ALTACE) 1.25 MG capsule Take 5 mg by mouth daily.      . rosuvastatin (CRESTOR) 10 MG tablet Take 10 mg by mouth daily.        Marland Kitchen thiamine (VITAMIN B-1) 100 MG tablet Take 250 mg by mouth daily.      . vitamin E 100 UNIT capsule Take 100 Units by mouth daily.       No current facility-administered medications for this visit.    Allergies:   No Known Allergies  Social History:  The patient  reports that he quit smoking about 14 years ago. His smoking use included Cigarettes. He smoked 0.00 packs per day. He has never used smokeless tobacco. He reports that he drinks alcohol. He reports that he does not use illicit drugs.   Family History:  The patient's family history includes Heart disease in his father and mother; Hypertension in his mother.   ROS:  Please see the history of present illness.      All other systems reviewed and negative.   PHYSICAL EXAM: VS:  BP 122/84  Pulse 80  Ht  (1.854 m)  Wt 211 lb (95.709 kg)  BMI 27.84 kg/m2 Well nourished, well developed, in no acute distress HEENT: normal Neck: no JVD Cardiac:  normal  S1, S2; irregularly irregular; no murmur Lungs:  clear to auscultation bilaterally, no wheezing, rhonchi or rales Abd: soft, nontender, no hepatomegaly Ext: no edema Skin: warm and dry Neuro:  CNs 2-12 intact, no focal abnormalities noted   EKG:  Atrial fibrillation with CVR with IRBBB   ASSESSMENT AND PLAN:  1. Chronic atrial fibrillation rate controlled - continue Pradaxa/metoprolol 2. Chronic systemic anticoagulation 3. HTN well controlled - continue amlodipine/metoprolol/altace/HCTZ  Followup with me in 1 year  Signed, Armanda Magic, MD 02/10/2014 3:09 PM

## 2014-02-10 NOTE — Patient Instructions (Signed)
Your physician recommends that you continue on your current medications as directed. Please refer to the Current Medication list given to you today.  Your physician wants you to follow-up in: 6 months with Dr [name] You will receive a reminder letter in the mail two months in advance. If you don't receive a letter, please call our office to schedule the follow-up appointment.  

## 2014-02-16 ENCOUNTER — Other Ambulatory Visit: Payer: Medicare Other

## 2014-02-16 ENCOUNTER — Ambulatory Visit: Payer: Medicare Other | Admitting: Oncology

## 2014-03-15 ENCOUNTER — Other Ambulatory Visit: Payer: Self-pay

## 2014-03-15 MED ORDER — AMLODIPINE BESYLATE 10 MG PO TABS: ORAL_TABLET | ORAL | Status: DC

## 2014-04-27 ENCOUNTER — Other Ambulatory Visit: Payer: Self-pay | Admitting: *Deleted

## 2014-04-27 ENCOUNTER — Other Ambulatory Visit (HOSPITAL_BASED_OUTPATIENT_CLINIC_OR_DEPARTMENT_OTHER): Payer: Medicare Other

## 2014-04-27 LAB — COMPREHENSIVE METABOLIC PANEL (CC13)
ALK PHOS: 46 U/L (ref 40–150)
ALT: 16 U/L (ref 0–55)
AST: 18 U/L (ref 5–34)
Albumin: 4.1 g/dL (ref 3.5–5.0)
Anion Gap: 10 mEq/L (ref 3–11)
BUN: 12.4 mg/dL (ref 7.0–26.0)
CO2: 27 mEq/L (ref 22–29)
Calcium: 10 mg/dL (ref 8.4–10.4)
Chloride: 99 mEq/L (ref 98–109)
Creatinine: 1.1 mg/dL (ref 0.7–1.3)
GLUCOSE: 99 mg/dL (ref 70–140)
Potassium: 3.7 mEq/L (ref 3.5–5.1)
Sodium: 136 mEq/L (ref 136–145)
Total Bilirubin: 1.11 mg/dL (ref 0.20–1.20)
Total Protein: 6.6 g/dL (ref 6.4–8.3)

## 2014-04-27 LAB — FERRITIN CHCC: FERRITIN: 166 ng/mL (ref 22–316)

## 2014-04-27 LAB — CBC WITH DIFFERENTIAL/PLATELET
BASO%: 0.6 % (ref 0.0–2.0)
BASOS ABS: 0 10*3/uL (ref 0.0–0.1)
EOS%: 3.5 % (ref 0.0–7.0)
Eosinophils Absolute: 0.2 10*3/uL (ref 0.0–0.5)
HCT: 40.9 % (ref 38.4–49.9)
HEMOGLOBIN: 13.9 g/dL (ref 13.0–17.1)
LYMPH%: 24.6 % (ref 14.0–49.0)
MCH: 32.8 pg (ref 27.2–33.4)
MCHC: 33.9 g/dL (ref 32.0–36.0)
MCV: 96.8 fL (ref 79.3–98.0)
MONO#: 0.6 10*3/uL (ref 0.1–0.9)
MONO%: 9 % (ref 0.0–14.0)
NEUT#: 4.3 10*3/uL (ref 1.5–6.5)
NEUT%: 62.3 % (ref 39.0–75.0)
Platelets: 203 10*3/uL (ref 140–400)
RBC: 4.23 10*6/uL (ref 4.20–5.82)
RDW: 13.3 % (ref 11.0–14.6)
WBC: 6.8 10*3/uL (ref 4.0–10.3)
lymph#: 1.7 10*3/uL (ref 0.9–3.3)

## 2014-06-11 ENCOUNTER — Telehealth: Payer: Self-pay | Admitting: Cardiology

## 2014-06-11 NOTE — Telephone Encounter (Signed)
New Msg     Pt c/o medication issue: 1. Name of Medication: Pradaxa 2. How are you currently taking this medication (dosage and times per day)? 30 day supply 3. Are you having a reaction (difficulty breathing--STAT)? no 4. What is your medication issue? Pt calling, received letter from pharmacy that is requesting to know why Pradaxa is being prescribed over other medications.  Pt is concerned that he will not be able to get medication. Please contact pt.

## 2014-06-11 NOTE — Telephone Encounter (Signed)
Instructed patient to fax over the paper work and it will be completed.

## 2014-06-17 ENCOUNTER — Telehealth: Payer: Self-pay

## 2014-06-17 NOTE — Telephone Encounter (Signed)
Reference #:  [id number]

## 2014-06-17 NOTE — Telephone Encounter (Signed)
Message left for patient that prior authorization was approved and to call if he has any problems getting his Pradaxa.    Member ID [id number]

## 2014-07-26 ENCOUNTER — Other Ambulatory Visit: Payer: Self-pay | Admitting: *Deleted

## 2014-07-27 ENCOUNTER — Other Ambulatory Visit (HOSPITAL_BASED_OUTPATIENT_CLINIC_OR_DEPARTMENT_OTHER): Payer: Medicare Other

## 2014-07-27 DIAGNOSIS — I4891 Unspecified atrial fibrillation: Secondary | ICD-10-CM

## 2014-07-27 LAB — COMPREHENSIVE METABOLIC PANEL (CC13)
ALBUMIN: 4.1 g/dL (ref 3.5–5.0)
ALT: 13 U/L (ref 0–55)
AST: 16 U/L (ref 5–34)
Alkaline Phosphatase: 41 U/L (ref 40–150)
Anion Gap: 12 mEq/L — ABNORMAL HIGH (ref 3–11)
BUN: 13.5 mg/dL (ref 7.0–26.0)
CHLORIDE: 98 meq/L (ref 98–109)
CO2: 27 meq/L (ref 22–29)
CREATININE: 1.1 mg/dL (ref 0.7–1.3)
Calcium: 10.4 mg/dL (ref 8.4–10.4)
EGFR: 67 mL/min/{1.73_m2} — ABNORMAL LOW (ref >90)
GLUCOSE: 98 mg/dL (ref 70–140)
POTASSIUM: 4.2 meq/L (ref 3.5–5.1)
Sodium: 138 mEq/L (ref 136–145)
TOTAL PROTEIN: 6.7 g/dL (ref 6.4–8.3)
Total Bilirubin: 1.65 mg/dL — ABNORMAL HIGH (ref 0.20–1.20)

## 2014-07-27 LAB — CBC WITH DIFFERENTIAL/PLATELET
BASO%: 1.3 % (ref 0.0–2.0)
BASOS ABS: 0.1 10*3/uL (ref 0.0–0.1)
EOS%: 3.1 % (ref 0.0–7.0)
Eosinophils Absolute: 0.2 10*3/uL (ref 0.0–0.5)
HEMATOCRIT: 41.9 % (ref 38.4–49.9)
HEMOGLOBIN: 14.4 g/dL (ref 13.0–17.1)
LYMPH#: 1.7 10*3/uL (ref 0.9–3.3)
LYMPH%: 24.7 % (ref 14.0–49.0)
MCH: 33.1 pg (ref 27.2–33.4)
MCHC: 34.3 g/dL (ref 32.0–36.0)
MCV: 96.5 fL (ref 79.3–98.0)
MONO#: 0.7 10*3/uL (ref 0.1–0.9)
MONO%: 10 % (ref 0.0–14.0)
NEUT#: 4.2 10*3/uL (ref 1.5–6.5)
NEUT%: 60.9 % (ref 39.0–75.0)
Platelets: 193 10*3/uL (ref 140–400)
RBC: 4.34 10*6/uL (ref 4.20–5.82)
RDW: 13.5 % (ref 11.0–14.6)
WBC: 6.9 10*3/uL (ref 4.0–10.3)

## 2014-07-27 LAB — PROTIME-INR
INR: 1 — AB (ref 2.00–3.50)
Protime: 12 Seconds (ref 10.6–13.4)

## 2014-10-08 ENCOUNTER — Telehealth: Payer: Self-pay | Admitting: *Deleted

## 2014-10-08 NOTE — Telephone Encounter (Signed)
This RN spoke with pt per his call inquiring what his last ferritin level was and probable need to start phlebotomies.  Last ferritin was 166.  Per discussion plan is for pt to have lab recheck next week ( returning from the beach) and then schedule for PICC line and phlebotomies x 4. 1 st phlebotomy same day as PICC placement.  Rosanne AshingJim will call upon return from the beach to schedule lab for appropriate scheduling and treatment plan orders.

## 2014-10-14 ENCOUNTER — Other Ambulatory Visit: Payer: Self-pay | Admitting: *Deleted

## 2014-10-14 DIAGNOSIS — I878 Other specified disorders of veins: Secondary | ICD-10-CM

## 2014-10-14 NOTE — Progress Notes (Signed)
This RN spoke with pt - scheduled for lab tomorrow.  This RN also placed order per MD for PICC line placement by IR for next week.  Note pt would like to have 1st phlebotomy same day as PICC line placement.

## 2014-10-15 ENCOUNTER — Other Ambulatory Visit (HOSPITAL_BASED_OUTPATIENT_CLINIC_OR_DEPARTMENT_OTHER): Payer: Medicare Other

## 2014-10-15 DIAGNOSIS — I878 Other specified disorders of veins: Secondary | ICD-10-CM

## 2014-10-15 LAB — CBC WITH DIFFERENTIAL/PLATELET
BASO%: 0.3 % (ref 0.0–2.0)
BASOS ABS: 0 10*3/uL (ref 0.0–0.1)
EOS ABS: 0.3 10*3/uL (ref 0.0–0.5)
EOS%: 4 % (ref 0.0–7.0)
HEMATOCRIT: 40.1 % (ref 38.4–49.9)
HEMOGLOBIN: 14.4 g/dL (ref 13.0–17.1)
LYMPH#: 1.9 10*3/uL (ref 0.9–3.3)
LYMPH%: 26.6 % (ref 14.0–49.0)
MCH: 33.7 pg — ABNORMAL HIGH (ref 27.2–33.4)
MCHC: 35.9 g/dL (ref 32.0–36.0)
MCV: 93.9 fL (ref 79.3–98.0)
MONO#: 0.6 10*3/uL (ref 0.1–0.9)
MONO%: 9 % (ref 0.0–14.0)
NEUT#: 4.2 10*3/uL (ref 1.5–6.5)
NEUT%: 60.1 % (ref 39.0–75.0)
Platelets: 180 10*3/uL (ref 140–400)
RBC: 4.27 10*6/uL (ref 4.20–5.82)
RDW: 13.2 % (ref 11.0–14.6)
WBC: 7 10*3/uL (ref 4.0–10.3)

## 2014-10-15 LAB — FERRITIN CHCC: FERRITIN: 199 ng/mL (ref 22–316)

## 2014-10-15 LAB — COMPREHENSIVE METABOLIC PANEL (CC13)
ALK PHOS: 45 U/L (ref 40–150)
ALT: 17 U/L (ref 0–55)
AST: 19 U/L (ref 5–34)
Albumin: 4 g/dL (ref 3.5–5.0)
Anion Gap: 13 mEq/L — ABNORMAL HIGH (ref 3–11)
BUN: 14.9 mg/dL (ref 7.0–26.0)
CO2: 26 mEq/L (ref 22–29)
Calcium: 10.3 mg/dL (ref 8.4–10.4)
Chloride: 100 mEq/L (ref 98–109)
Creatinine: 1 mg/dL (ref 0.7–1.3)
EGFR: 72 mL/min/{1.73_m2} — ABNORMAL LOW (ref >90)
GLUCOSE: 95 mg/dL (ref 70–140)
Potassium: 3.8 mEq/L (ref 3.5–5.1)
SODIUM: 139 meq/L (ref 136–145)
TOTAL PROTEIN: 6.6 g/dL (ref 6.4–8.3)
Total Bilirubin: 1.45 mg/dL — ABNORMAL HIGH (ref 0.20–1.20)

## 2014-10-21 ENCOUNTER — Other Ambulatory Visit: Payer: Self-pay | Admitting: *Deleted

## 2014-10-21 ENCOUNTER — Ambulatory Visit (HOSPITAL_COMMUNITY)
Admission: RE | Admit: 2014-10-21 | Discharge: 2014-10-21 | Disposition: A | Discharge status: Home / Self Care | Payer: Medicare Other | Source: Ambulatory Visit | Attending: Oncology | Admitting: Oncology

## 2014-10-21 ENCOUNTER — Other Ambulatory Visit: Payer: Self-pay | Admitting: Oncology

## 2014-10-21 ENCOUNTER — Ambulatory Visit (HOSPITAL_BASED_OUTPATIENT_CLINIC_OR_DEPARTMENT_OTHER): Payer: Medicare Other

## 2014-10-21 DIAGNOSIS — Z452 Encounter for adjustment and management of vascular access device: Secondary | ICD-10-CM

## 2014-10-21 DIAGNOSIS — I878 Other specified disorders of veins: Secondary | ICD-10-CM

## 2014-10-21 HISTORY — PX: PICC LINE PLACE PERIPHERAL (ARMC HX): HXRAD1248

## 2014-10-21 MED ORDER — HEPARIN SOD (PORK) LOCK FLUSH 100 UNIT/ML IV SOLN
500.0000 [IU] | Freq: Once | INTRAVENOUS | Status: AC
  Administered 2014-10-21: 500 [IU] via INTRAVENOUS
  Filled 2014-10-21: qty 5

## 2014-10-21 MED ORDER — SODIUM CHLORIDE 0.9 % IJ SOLN
10.0000 mL | INTRAMUSCULAR | Status: DC | PRN
  Administered 2014-10-21: 10 mL via INTRAVENOUS
  Filled 2014-10-21: qty 10

## 2014-10-21 MED ORDER — LIDOCAINE HCL 1 % IJ SOLN
INTRAMUSCULAR | Status: DC
Start: 2014-10-21 — End: 2014-10-22
  Filled 2014-10-21: qty 20

## 2014-10-21 NOTE — Procedures (Signed)
Placement of right arm PICC.  Tip in lower SVC and ready to use.   

## 2014-10-21 NOTE — Progress Notes (Signed)
Phlebotomy performed via right upper forearm single lumen PICC line.  removed over duration of 20 minutes.  Pt stayed for observation for 30 minutes post procedure and vitals remained stable.

## 2014-10-22 ENCOUNTER — Telehealth: Payer: Self-pay | Admitting: Oncology

## 2014-10-22 ENCOUNTER — Other Ambulatory Visit: Payer: Self-pay | Admitting: *Deleted

## 2014-10-22 NOTE — Telephone Encounter (Signed)
s.w. pt wife and advised on June thru JULY appts....Marland Kitchen.ok and aware

## 2014-10-26 ENCOUNTER — Other Ambulatory Visit: Payer: Self-pay | Admitting: *Deleted

## 2014-10-27 ENCOUNTER — Other Ambulatory Visit (HOSPITAL_COMMUNITY): Payer: Medicare Other

## 2014-10-27 ENCOUNTER — Other Ambulatory Visit: Payer: Medicare Other

## 2014-10-28 ENCOUNTER — Other Ambulatory Visit (HOSPITAL_BASED_OUTPATIENT_CLINIC_OR_DEPARTMENT_OTHER): Payer: Medicare Other

## 2014-10-28 ENCOUNTER — Ambulatory Visit (HOSPITAL_BASED_OUTPATIENT_CLINIC_OR_DEPARTMENT_OTHER): Payer: Medicare Other

## 2014-10-28 ENCOUNTER — Other Ambulatory Visit: Payer: Medicare Other

## 2014-10-28 LAB — CBC WITH DIFFERENTIAL/PLATELET
BASO%: 0.3 % (ref 0.0–2.0)
Basophils Absolute: 0 10*3/uL (ref 0.0–0.1)
EOS ABS: 0.2 10*3/uL (ref 0.0–0.5)
EOS%: 2.4 % (ref 0.0–7.0)
HEMATOCRIT: 37.2 % — AB (ref 38.4–49.9)
HGB: 13.4 g/dL (ref 13.0–17.1)
LYMPH#: 1.4 10*3/uL (ref 0.9–3.3)
LYMPH%: 21.7 % (ref 14.0–49.0)
MCH: 33.8 pg — ABNORMAL HIGH (ref 27.2–33.4)
MCHC: 36 g/dL (ref 32.0–36.0)
MCV: 93.7 fL (ref 79.3–98.0)
MONO#: 0.4 10*3/uL (ref 0.1–0.9)
MONO%: 6.4 % (ref 0.0–14.0)
NEUT#: 4.3 10*3/uL (ref 1.5–6.5)
NEUT%: 69.2 % (ref 39.0–75.0)
Platelets: 175 10*3/uL (ref 140–400)
RBC: 3.97 10*6/uL — ABNORMAL LOW (ref 4.20–5.82)
RDW: 13.2 % (ref 11.0–14.6)
WBC: 6.2 10*3/uL (ref 4.0–10.3)

## 2014-10-28 LAB — FERRITIN CHCC: FERRITIN: 176 ng/mL (ref 22–316)

## 2014-10-28 NOTE — Progress Notes (Signed)
Therapeutic Phlebotomy performed via PICC to right arm.  540cc removed using 9 60cc syringes over approximately 10 minutes.  Pt tolerated well.  VSS.  PICC dressing/caps changed.  Pt observed 30 minutes post procedure.  Drink provided.

## 2014-10-28 NOTE — Patient Instructions (Signed)

## 2014-10-29 ENCOUNTER — Encounter: Payer: Self-pay | Admitting: Family

## 2014-11-01 ENCOUNTER — Ambulatory Visit (HOSPITAL_COMMUNITY)
Admission: RE | Admit: 2014-11-01 | Discharge: 2014-11-01 | Disposition: A | Discharge status: Home / Self Care | Payer: Medicare Other | Source: Ambulatory Visit | Attending: Family | Admitting: Family

## 2014-11-01 ENCOUNTER — Ambulatory Visit (INDEPENDENT_AMBULATORY_CARE_PROVIDER_SITE_OTHER): Payer: Medicare Other | Admitting: Family

## 2014-11-01 ENCOUNTER — Encounter: Payer: Self-pay | Admitting: Family

## 2014-11-01 VITALS — BP 106/68 | HR 72 | Resp 16 | Ht 73.0 in | Wt 207.0 lb

## 2014-11-01 DIAGNOSIS — Z48812 Encounter for surgical aftercare following surgery on the circulatory system: Secondary | ICD-10-CM | POA: Insufficient documentation

## 2014-11-01 DIAGNOSIS — Z87891 Personal history of nicotine dependence: Secondary | ICD-10-CM | POA: Diagnosis not present

## 2014-11-01 DIAGNOSIS — I714 Abdominal aortic aneurysm, without rupture, unspecified: Secondary | ICD-10-CM

## 2014-11-01 DIAGNOSIS — Z4889 Encounter for other specified surgical aftercare: Secondary | ICD-10-CM | POA: Diagnosis not present

## 2014-11-01 DIAGNOSIS — Z95828 Presence of other vascular implants and grafts: Secondary | ICD-10-CM | POA: Diagnosis not present

## 2014-11-01 NOTE — Progress Notes (Signed)
[facility]  Established EV[facility]  History of Present Illness  Eugene [name] is a 79 y.o. (06-Sep-1935) male  patient of Dr. Myra Gianotti. He is a former patient of Dr. Madilyn Fireman. He is status post endovascular aneurysm repair on 06/29/2007 using an Endologix device. He is back today for followup. Most recent EVAR duplex (Date: 10/28/13) demonstrates: no endoleak and 2.7 cm sac size.  Most recent CTA (Date: 08/28/2007) demonstrates:Endograft stent in place infrarenally within the  abdominal aorta. The aneurysm sac size currently measures 3.8 cm  compared to of 4.2 cm maximally previously. No evidence of  endoleak. Renal arteries are widely patent. IMA is occluded, with  branch filling collaterally via SMA collaterals. The stent graft ends in the common iliac arteries  bilaterally which are heavily calcified and tortuous. No evidence  of aneurysm or significant stenosis. Heavily calcified internal  iliac arteries noted, patent. Mild disease within the common  femoral arteries bilaterally.   The patient denies back or abdominal pain. Pt states he is physically active. 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  He has hemachromatosis, diagnosed in the 1990's, PICC line was inserted May 2016 for access to serial phlebotomy, had recent high ferritin.  He has IBS, states this is the likely cause of his gas.  Pt Diabetic: No Pt smoker: former smoker, quit in 2001 He takes Pradaxa for atrial fib, Dr. Mayford Knife is his cardiologist.   Pt denies claudication symptoms in his legs with walking. He had a stroke in 2001 as manifested by right eye limited movement; he then had several TIA's afterward as anticipated by his cardiologist, his last TIA was about January 2015.  Pt states his cardiologist is checking his carotid arteries by [place].   Past Medical History   Diagnosis Date  . Hypertension   . Hyperlipidemia   . Hemochromatosis   . AAA (abdominal aortic aneurysm) 06/29/2007    stent graft  . Stroke     Transient Ischemic Attack  . Irritable bowel syndrome   . Chronic atrial fibrillation   . GERD (gastroesophageal reflux disease)    Past Surgical History  Procedure Laterality Date  . Endovascular stent insertion  07/18/2007    saccular infrarenal aortic aneurysm  . Abdominal aortic aneurysm repair     Social History History  Substance Use Topics  . Smoking status: Former Smoker    Types: Cigarettes    Quit date: 06/18/1999  . Smokeless tobacco: Never Used  . Alcohol Use: 0.0 oz/week    1-2 drink(s) per week   Family History Family History  Problem Relation Age of Onset  . Hypertension Mother   . Heart disease Mother     AAA, he passed in his 71 s of Heart Diseasse  . Heart disease Father     AAA history and he passed at age 41  of   Disease   Current Outpatient Prescriptions on File Prior to Visit  Medication Sig Dispense Refill  . amLODipine (NORVASC) 10 MG tablet TAKE 1 TABLET EVERY DAY 30 tablet 11  . Ascorbic Acid (VITAMIN C) 100 MG tablet Take 100 mg by mouth daily.    Marland Kitchen CIALIS 20 MG tablet     . dabigatran (PRADAXA) 150 MG CAPS Take 150 mg by mouth every 12 (twelve) hours.    Marland Kitchen esomeprazole (NEXIUM) 40 MG capsule Take 40 mg by mouth  daily before breakfast.    . hydrochlorothiazide (HYDRODIURIL) 25 MG tablet Take 25 mg by mouth daily.    . hyoscyamine (LEVBID) 0.375 MG 12 hr tablet Take 0.375 mg by mouth every 12 (twelve) hours as needed.    Marland Kitchen. KLOR-CON 10 10 MEQ tablet     . LORazepam (ATIVAN) 0.5 MG tablet Take 0.5 mg by mouth every 8 (eight) hours.    . metoprolol tartrate (LOPRESSOR) 25 MG tablet Take 25 mg by mouth 2 (two) times daily.     . ramipril (ALTACE) 1.25 MG capsule Take 5 mg by mouth daily.    . rosuvastatin (CRESTOR) 10 MG tablet Take 10 mg by mouth daily.    Marland Kitchen. thiamine (VITAMIN B-1) 100 MG tablet Take  250 mg by mouth daily.    . vitamin E 100 UNIT capsule Take 100 Units by mouth daily.     No current facility-administered medications on file prior to visit.   No Known Allergies   ROS: See HPI for pertinent positives and negatives.  Physical Examination  Filed Vitals:   11/01/14 0911  BP: 106/68  Pulse: 72  Resp: 16  Height: 6\' 1"  (1.854 m)  Weight: 207 lb (93.895 kg)  SpO2: 99%   Body mass index is 27.32 kg/(m^2).   General: A&O x 3, WD.  Pulmonary: Sym exp, good air movt, CTAB, no rales, rhonchi, or wheezing.   Cardiac: RRR, Nl S1, S2, no Murmur detected.  Vascular: Vessel Right Left  Radial 2+Palpable 2+Palpable  Carotid without bruit without bruit  Femoral Not palpable  2+Palpable   Aorta Not palpable N/A  Popliteal Not palpable Not palpable  PT 2+Palpable Not Palpable  DP 1+Palpable 1+Palpable   Gastrointestinal: soft, NTND, -G/R, - HSM, - palpable masses, - CVAT B   Musculoskeletal: M/S 5/5 throughout, Extremities without ischemic changes  Neurologic: Pain and light touch intact in extremities, CN 2-12 intact, Motor exam as listed above         Non-Invasive Vascular Imaging  EVAR Duplex (Date: 11/01/2014) ABDOMINAL AORTA DUPLEX EVALUATION - POST ENDOVASCULAR REPAIR    INDICATION: Stent repair of abdominal aortic aneurysm    PREVIOUS INTERVENTION(S): Stent repair of abdominal aortic aneurysm on 07/18/2007.    DUPLEX EXAM:      DIAMETER AP (cm) DIAMETER TRANSVERSE (cm) VELOCITIES (cm/sec)  Aorta 2.63 2.63 46  Right Common Iliac Not Visualized  Not Visualized    Left Common Iliac Not visualized  Not visualized     Comparison Study       Date DIAMETER AP (cm) DIAMETER TRANSVERSE (cm)  10/28/2013 2.5 2.7     ADDITIONAL FINDINGS: Technically limited and difficult study due to overlying bowel gas.    IMPRESSION: Patent stent of the abdominal aorta with a maximum diameter of 2.63 x 2.63 cm.    Compared to the previous  exam:  No significant change in comparison to the last exam on 10/28/2013.      Medical Decision Making  Eugene [name] is a 79 y.o. male who is status post endovascular aneurysm repair on 06/29/2007.  Pt is asymptomatic with stable sac size.  I discussed with the patient the importance of surveillance of the endograft.  The next endograft duplex will be scheduled for 12 months.  The patient will follow up with us in 12 months with these studies.  I emphasized the importance of maximal medical management including strict control of blood pressure, blood glucose, and lipid levels, antiplatelet agents, obtaining regular exercise, and  cessation of smoking.   The patient was given information about AAA including signs, symptoms, treatment, and how to minimize the risk of enlargement and rupture of aneurysms.    Thank you for allowing [place] to participate in this patient's care.  Charisse March, RN, MSN, FNP-C [facility] Office: [phone]  Clinic Physician: Myra Gianotti  11/01/2014, 9:15 AM

## 2014-11-02 NOTE — Addendum Note (Signed)
Addended by: Adria DillELDRIDGE-LEWIS, Julio Zappia L on: 11/02/2014 02:36 PM   Modules accepted: Orders

## 2014-11-03 ENCOUNTER — Other Ambulatory Visit: Payer: Self-pay | Admitting: *Deleted

## 2014-11-04 ENCOUNTER — Other Ambulatory Visit: Payer: Self-pay | Admitting: Medical Oncology

## 2014-11-04 ENCOUNTER — Other Ambulatory Visit: Payer: Self-pay | Admitting: *Deleted

## 2014-11-04 ENCOUNTER — Ambulatory Visit (HOSPITAL_BASED_OUTPATIENT_CLINIC_OR_DEPARTMENT_OTHER): Payer: Medicare Other

## 2014-11-04 ENCOUNTER — Other Ambulatory Visit (HOSPITAL_BASED_OUTPATIENT_CLINIC_OR_DEPARTMENT_OTHER): Payer: Medicare Other

## 2014-11-04 DIAGNOSIS — Z452 Encounter for adjustment and management of vascular access device: Secondary | ICD-10-CM

## 2014-11-04 LAB — CBC WITH DIFFERENTIAL/PLATELET
BASO%: 0.9 % (ref 0.0–2.0)
Basophils Absolute: 0.1 10*3/uL (ref 0.0–0.1)
EOS ABS: 0.3 10*3/uL (ref 0.0–0.5)
EOS%: 5 % (ref 0.0–7.0)
HCT: 34.9 % — ABNORMAL LOW (ref 38.4–49.9)
HEMOGLOBIN: 12.4 g/dL — AB (ref 13.0–17.1)
LYMPH%: 23.8 % (ref 14.0–49.0)
MCH: 33.9 pg — ABNORMAL HIGH (ref 27.2–33.4)
MCHC: 35.4 g/dL (ref 32.0–36.0)
MCV: 95.7 fL (ref 79.3–98.0)
MONO#: 0.6 10*3/uL (ref 0.1–0.9)
MONO%: 9.3 % (ref 0.0–14.0)
NEUT#: 4 10*3/uL (ref 1.5–6.5)
NEUT%: 61 % (ref 39.0–75.0)
Platelets: 210 10*3/uL (ref 140–400)
RBC: 3.65 10*6/uL — ABNORMAL LOW (ref 4.20–5.82)
RDW: 14.1 % (ref 11.0–14.6)
WBC: 6.6 10*3/uL (ref 4.0–10.3)
lymph#: 1.6 10*3/uL (ref 0.9–3.3)

## 2014-11-04 LAB — FERRITIN CHCC: Ferritin: 160 ng/ml (ref 22–316)

## 2014-11-04 MED ORDER — SODIUM CHLORIDE 0.9 % IJ SOLN
10.0000 mL | Freq: Once | INTRAMUSCULAR | Status: AC
  Administered 2014-11-04: 10 mL via INTRAVENOUS
  Filled 2014-11-04: qty 10

## 2014-11-04 MED ORDER — HEPARIN SOD (PORK) LOCK FLUSH 100 UNIT/ML IV SOLN
250.0000 [IU] | Freq: Once | INTRAVENOUS | Status: AC
  Administered 2014-11-04: 250 [IU] via INTRAVENOUS
  Filled 2014-11-04: qty 5

## 2014-11-04 NOTE — Progress Notes (Signed)
Phlebotomy preformed via Right PICC line. 540 cc removed between 1112 and 1127. Pt tolerated procedure well. Drinks provided and monitored for 30 mins post-procedure. PICC dressing ans caps changed.

## 2014-11-04 NOTE — Addendum Note (Signed)
Addended by: Almon Register on: 11/04/2014 12:31 PM   Modules accepted: Orders

## 2014-11-05 ENCOUNTER — Ambulatory Visit: Payer: Medicare Other

## 2014-11-05 NOTE — Patient Instructions (Signed)
PICC Home Guide A peripherally inserted central catheter (PICC) is a long, thin, flexible tube that is inserted into a vein in the upper arm. It is a form of intravenous (IV) access. It is considered to be a "central" line because the tip of the PICC ends in a large vein in your chest. This large vein is called the superior vena cava (SVC). The PICC tip ends in the SVC because there is a lot of blood flow in the SVC. This allows medicines and IV fluids to be quickly distributed throughout the body. The PICC is inserted using a sterile technique by a specially trained nurse or physician. After the PICC is inserted, a chest X-ray exam is done to be sure it is in the correct place.  A PICC may be placed for different reasons, such as:  To give medicines and liquid nutrition that can only be given through a central line. Examples are:  Certain antibiotic treatments.  Chemotherapy.  Total parenteral nutrition (TPN).  To take frequent blood samples.  To give IV fluids and blood products.  If there is difficulty placing a peripheral intravenous (PIV) catheter. If taken care of properly, a PICC can remain in place for several months. A PICC can also allow a person to go home from the hospital early. Medicine and PICC care can be managed at home by a family member or home health care team. WHAT PROBLEMS CAN HAPPEN WHEN I HAVE A PICC? Problems with a PICC can occasionally occur. These may include the following:  A blood clot (thrombus) forming in or at the tip of the PICC. This can cause the PICC to become clogged. A clot-dissolving medicine called tissue plasminogen activator (tPA) can be given through the PICC to help break up the clot.  Inflammation of the vein (phlebitis) in which the PICC is placed. Signs of inflammation may include redness, pain at the insertion site, red streaks, or being able to feel a "cord" in the vein where the PICC is located.  Infection in the PICC or at the insertion  site. Signs of infection may include fever, chills, redness, swelling, or pus drainage from the PICC insertion site.  PICC movement (malposition). The PICC tip may move from its original position due to excessive physical activity, forceful coughing, sneezing, or vomiting.  A break or cut in the PICC. It is important to not use scissors near the PICC.  Nerve or tendon irritation or injury during PICC insertion. WHAT SHOULD I KEEP IN MIND ABOUT ACTIVITIES WHEN I HAVE A PICC?  You may bend your arm and move it freely. If your PICC is near or at the bend of your elbow, avoid activity with repeated motion at the elbow.  Rest at home for the remainder of the day following PICC line insertion.  Avoid lifting heavy objects as instructed by your health care provider.  Avoid using a crutch with the arm on the same side as your PICC. You may need to use a walker. WHAT SHOULD I KNOW ABOUT MY PICC DRESSING?  Keep your PICC bandage (dressing) clean and dry to prevent infection.  Ask your health care provider when you may shower. Ask your health care provider to teach you how to wrap the PICC when you do take a shower.  Change the PICC dressing as instructed by your health care provider.  Change your PICC dressing if it becomes loose or wet. WHAT SHOULD I KNOW ABOUT PICC CARE?  Check the PICC insertion site   daily for leakage, redness, swelling, or pain.  Do not take a bath, swim, or use hot tubs when you have a PICC. Cover PICC line with clear plastic wrap and tape to keep it dry while showering.  Flush the PICC as directed by your health care provider. Let your health care provider know right away if the PICC is difficult to flush or does not flush. Do not use force to flush the PICC.  Do not use a syringe that is less than 10 mL to flush the PICC.  Never pull or tug on the PICC.  Avoid blood pressure checks on the arm with the PICC.  Keep your PICC identification card with you at all  times.  Do not take the PICC out yourself. Only a trained clinical professional should remove the PICC. SEEK IMMEDIATE MEDICAL CARE IF:  Your PICC is accidentally pulled all the way out. If this happens, cover the insertion site with a bandage or gauze dressing. Do not throw the PICC away. Your health care provider will need to inspect it.  Your PICC was tugged or pulled and has partially come out. Do not  push the PICC back in.  There is any type of drainage, redness, or swelling where the PICC enters the skin.  You cannot flush the PICC, it is difficult to flush, or the PICC leaks around the insertion site when it is flushed.  You hear a "flushing" sound when the PICC is flushed.  You have pain, discomfort, or numbness in your arm, shoulder, or jaw on the same side as the PICC.  You feel your heart "racing" or skipping beats.  You notice a hole or tear in the PICC.  You develop chills or a fever. MAKE SURE YOU:   Understand these instructions.  Will watch your condition.  Will get help right away if you are not doing well or get worse. Document Released: 11/18/2002 Document Revised: 09/28/2013 Document Reviewed: 01/19/2013 Exit[employer] Patient Information 2015 [employer]. This information is not intended to replace advice given to you by your health care provider. Make sure you discuss any questions you have with your health care provider.  

## 2014-11-05 NOTE — Progress Notes (Signed)
Pt came to flush to have PICC dressing assessed. Top far right corner of dressing peeling and area directly below inflamed. Pt reports he was unable to sleep at all last night due to a burning sensation. Pt applied "vasoline" which provided some relief. He was able to return to sleep only after taking a "heavy duty sleeping pill".  Dressing was removed, PICC line and area cleaned and allowed time to dry. Consulted Dr. Darrall Dears nurse, Val to apply an Opsite instead of replacing sorba-view to allow inflamed area to be exposed to air. Pt understood change of dressing type. Bio patch was replaced. Pt tolerated well. Pt instructed to keep area clean and dry. Pt was instructed to use bacitracin on inflamed area to promote healing.

## 2014-11-11 ENCOUNTER — Other Ambulatory Visit (HOSPITAL_BASED_OUTPATIENT_CLINIC_OR_DEPARTMENT_OTHER): Payer: Medicare Other

## 2014-11-11 ENCOUNTER — Ambulatory Visit (HOSPITAL_BASED_OUTPATIENT_CLINIC_OR_DEPARTMENT_OTHER): Payer: Medicare Other

## 2014-11-11 LAB — CBC WITH DIFFERENTIAL/PLATELET
BASO%: 0.8 % (ref 0.0–2.0)
Basophils Absolute: 0 10*3/uL (ref 0.0–0.1)
EOS%: 4.6 % (ref 0.0–7.0)
Eosinophils Absolute: 0.3 10*3/uL (ref 0.0–0.5)
HCT: 33.6 % — ABNORMAL LOW (ref 38.4–49.9)
HGB: 11.7 g/dL — ABNORMAL LOW (ref 13.0–17.1)
LYMPH#: 1.4 10*3/uL (ref 0.9–3.3)
LYMPH%: 22.8 % (ref 14.0–49.0)
MCH: 33.7 pg — AB (ref 27.2–33.4)
MCHC: 34.9 g/dL (ref 32.0–36.0)
MCV: 96.6 fL (ref 79.3–98.0)
MONO#: 0.6 10*3/uL (ref 0.1–0.9)
MONO%: 9.4 % (ref 0.0–14.0)
NEUT%: 62.4 % (ref 39.0–75.0)
NEUTROS ABS: 3.7 10*3/uL (ref 1.5–6.5)
Platelets: 211 10*3/uL (ref 140–400)
RBC: 3.48 10*6/uL — ABNORMAL LOW (ref 4.20–5.82)
RDW: 14 % (ref 11.0–14.6)
WBC: 6 10*3/uL (ref 4.0–10.3)

## 2014-11-11 LAB — FERRITIN CHCC: FERRITIN: 120 ng/mL (ref 22–316)

## 2014-11-11 NOTE — Progress Notes (Signed)
500cc blood drawn from single lumen picc. Pt tolerated well, snack given. PICC line dressing change done.

## 2014-11-11 NOTE — Patient Instructions (Signed)

## 2014-11-18 ENCOUNTER — Ambulatory Visit (HOSPITAL_BASED_OUTPATIENT_CLINIC_OR_DEPARTMENT_OTHER): Payer: Medicare Other

## 2014-11-18 ENCOUNTER — Other Ambulatory Visit (HOSPITAL_BASED_OUTPATIENT_CLINIC_OR_DEPARTMENT_OTHER): Payer: Medicare Other

## 2014-11-18 DIAGNOSIS — R031 Nonspecific low blood-pressure reading: Secondary | ICD-10-CM | POA: Diagnosis not present

## 2014-11-18 LAB — CBC WITH DIFFERENTIAL/PLATELET
BASO%: 0.5 % (ref 0.0–2.0)
Basophils Absolute: 0 10*3/uL (ref 0.0–0.1)
EOS%: 5.6 % (ref 0.0–7.0)
Eosinophils Absolute: 0.3 10*3/uL (ref 0.0–0.5)
HCT: 34.8 % — ABNORMAL LOW (ref 38.4–49.9)
HEMOGLOBIN: 12.2 g/dL — AB (ref 13.0–17.1)
LYMPH%: 26.3 % (ref 14.0–49.0)
MCH: 33.7 pg — ABNORMAL HIGH (ref 27.2–33.4)
MCHC: 35.1 g/dL (ref 32.0–36.0)
MCV: 96.1 fL (ref 79.3–98.0)
MONO#: 0.5 10*3/uL (ref 0.1–0.9)
MONO%: 8.2 % (ref 0.0–14.0)
NEUT%: 59.4 % (ref 39.0–75.0)
NEUTROS ABS: 3.4 10*3/uL (ref 1.5–6.5)
PLATELETS: 184 10*3/uL (ref 140–400)
RBC: 3.62 10*6/uL — ABNORMAL LOW (ref 4.20–5.82)
RDW: 14.3 % (ref 11.0–14.6)
WBC: 5.8 10*3/uL (ref 4.0–10.3)
lymph#: 1.5 10*3/uL (ref 0.9–3.3)

## 2014-11-18 LAB — FERRITIN CHCC: Ferritin: 88 ng/ml (ref 22–316)

## 2014-11-18 MED ORDER — SODIUM CHLORIDE 0.9 % IV SOLN
Freq: Once | INTRAVENOUS | Status: AC
  Administered 2014-11-18: 12:00:00 via INTRAVENOUS

## 2014-11-18 NOTE — Patient Instructions (Signed)

## 2014-11-18 NOTE — Progress Notes (Signed)
Therapeutic phlebotomy given to patient. 540 ml (9-60cc) removed from patient right picc line. Patient tolerated well. Given 500 ml bolus of Normal saline due to patient low blood pressure (per MD Magrinat). Patient observed and discharged in stable condition.

## 2014-11-25 ENCOUNTER — Ambulatory Visit (HOSPITAL_BASED_OUTPATIENT_CLINIC_OR_DEPARTMENT_OTHER): Payer: Medicare Other

## 2014-11-25 ENCOUNTER — Other Ambulatory Visit: Payer: Self-pay | Admitting: *Deleted

## 2014-11-25 ENCOUNTER — Other Ambulatory Visit (HOSPITAL_BASED_OUTPATIENT_CLINIC_OR_DEPARTMENT_OTHER): Payer: Medicare Other

## 2014-11-25 LAB — CBC WITH DIFFERENTIAL/PLATELET
BASO%: 0.3 % (ref 0.0–2.0)
BASOS ABS: 0 10*3/uL (ref 0.0–0.1)
EOS ABS: 0.3 10*3/uL (ref 0.0–0.5)
EOS%: 4.4 % (ref 0.0–7.0)
HCT: 33.6 % — ABNORMAL LOW (ref 38.4–49.9)
HGB: 12.1 g/dL — ABNORMAL LOW (ref 13.0–17.1)
LYMPH%: 23.5 % (ref 14.0–49.0)
MCH: 34.4 pg — ABNORMAL HIGH (ref 27.2–33.4)
MCHC: 36 g/dL (ref 32.0–36.0)
MCV: 95.5 fL (ref 79.3–98.0)
MONO#: 0.6 10*3/uL (ref 0.1–0.9)
MONO%: 9.7 % (ref 0.0–14.0)
NEUT%: 62.1 % (ref 39.0–75.0)
NEUTROS ABS: 3.8 10*3/uL (ref 1.5–6.5)
Platelets: 177 10*3/uL (ref 140–400)
RBC: 3.52 10*6/uL — ABNORMAL LOW (ref 4.20–5.82)
RDW: 14.3 % (ref 11.0–14.6)
WBC: 6.1 10*3/uL (ref 4.0–10.3)
lymph#: 1.4 10*3/uL (ref 0.9–3.3)

## 2014-11-25 LAB — FERRITIN CHCC: Ferritin: 77 ng/ml (ref 22–316)

## 2014-11-25 MED ORDER — HEPARIN SOD (PORK) LOCK FLUSH 100 UNIT/ML IV SOLN
500.0000 [IU] | Freq: Once | INTRAVENOUS | Status: AC
  Administered 2014-11-25: 500 [IU] via INTRAVENOUS
  Filled 2014-11-25: qty 5

## 2014-11-25 MED ORDER — SODIUM CHLORIDE 0.9 % IJ SOLN
10.0000 mL | INTRAMUSCULAR | Status: AC | PRN
  Administered 2014-11-25: 10 mL via INTRAVENOUS
  Filled 2014-11-25: qty 10

## 2014-11-25 NOTE — Progress Notes (Signed)
Phlebotomy started at 1150 and completed at 1203 540 cc removed via right PICC line. PICC dressing changed and new op-site dressing applied. Pt monitored for post-phlebotomy and drinks provided. Pt and VS stable at time of discharge.

## 2014-11-26 ENCOUNTER — Other Ambulatory Visit: Payer: Self-pay | Admitting: *Deleted

## 2014-12-02 ENCOUNTER — Ambulatory Visit (HOSPITAL_BASED_OUTPATIENT_CLINIC_OR_DEPARTMENT_OTHER): Payer: Medicare Other

## 2014-12-02 ENCOUNTER — Other Ambulatory Visit: Payer: Self-pay | Admitting: *Deleted

## 2014-12-02 ENCOUNTER — Other Ambulatory Visit (HOSPITAL_BASED_OUTPATIENT_CLINIC_OR_DEPARTMENT_OTHER): Payer: Medicare Other

## 2014-12-02 ENCOUNTER — Encounter: Payer: Self-pay | Admitting: *Deleted

## 2014-12-02 DIAGNOSIS — Z452 Encounter for adjustment and management of vascular access device: Secondary | ICD-10-CM | POA: Diagnosis not present

## 2014-12-02 LAB — CBC WITH DIFFERENTIAL/PLATELET
BASO%: 0.9 % (ref 0.0–2.0)
Basophils Absolute: 0.1 10*3/uL (ref 0.0–0.1)
EOS ABS: 0.3 10*3/uL (ref 0.0–0.5)
EOS%: 4.3 % (ref 0.0–7.0)
HCT: 34.5 % — ABNORMAL LOW (ref 38.4–49.9)
HGB: 12.1 g/dL — ABNORMAL LOW (ref 13.0–17.1)
LYMPH%: 21.8 % (ref 14.0–49.0)
MCH: 34.3 pg — ABNORMAL HIGH (ref 27.2–33.4)
MCHC: 35.1 g/dL (ref 32.0–36.0)
MCV: 97.6 fL (ref 79.3–98.0)
MONO#: 0.6 10*3/uL (ref 0.1–0.9)
MONO%: 8.6 % (ref 0.0–14.0)
NEUT#: 4.2 10*3/uL (ref 1.5–6.5)
NEUT%: 64.4 % (ref 39.0–75.0)
Platelets: 230 10*3/uL (ref 140–400)
RBC: 3.54 10*6/uL — AB (ref 4.20–5.82)
RDW: 14.1 % (ref 11.0–14.6)
WBC: 6.6 10*3/uL (ref 4.0–10.3)
lymph#: 1.4 10*3/uL (ref 0.9–3.3)

## 2014-12-02 LAB — FERRITIN CHCC: Ferritin: 59 ng/ml (ref 22–316)

## 2014-12-02 MED ORDER — SODIUM CHLORIDE 0.9 % IJ SOLN
10.0000 mL | INTRAMUSCULAR | Status: DC | PRN
  Administered 2014-12-02: 10 mL via INTRAVENOUS
  Filled 2014-12-02: qty 10

## 2014-12-02 MED ORDER — HEPARIN SOD (PORK) LOCK FLUSH 100 UNIT/ML IV SOLN
500.0000 [IU] | Freq: Once | INTRAVENOUS | Status: AC
  Administered 2014-12-02: 250 [IU] via INTRAVENOUS
  Filled 2014-12-02: qty 5

## 2014-12-02 NOTE — Progress Notes (Signed)
Phlebotomy performed through a right, single lumen PICC line without difficulty and 510 mls withdrawn. Drinks and snack provided post phlebotomy. Patient stayed 30 minutes post phlebotomy. VSS. See flowsheet for vital signs. PICC line dressing changed post phlebotomy. Within 10 minutes of changing the dressing, the patient complained of itching but no burning. Dressing changed again and no chlorhexidine was used. Used alcohol to clean the area. Noticed redness under Stat lock but no complaints after cleaning with alcohol. Opsite dressing placed. Patient denies itching and burning at time of discharge. Chlorhexadine added to patient's allergy list .

## 2014-12-02 NOTE — Patient Instructions (Signed)

## 2014-12-07 ENCOUNTER — Telehealth: Payer: Self-pay | Admitting: *Deleted

## 2014-12-07 NOTE — Telephone Encounter (Signed)
Per Dr.Magrinat, do labs and phlebotomy on Friday, July, 15th and then pull PICC line. Patient verbalized understanding.

## 2014-12-10 ENCOUNTER — Ambulatory Visit (HOSPITAL_BASED_OUTPATIENT_CLINIC_OR_DEPARTMENT_OTHER): Payer: Medicare Other

## 2014-12-10 ENCOUNTER — Other Ambulatory Visit (HOSPITAL_BASED_OUTPATIENT_CLINIC_OR_DEPARTMENT_OTHER): Payer: Medicare Other

## 2014-12-10 ENCOUNTER — Other Ambulatory Visit: Payer: Self-pay | Admitting: *Deleted

## 2014-12-10 LAB — CBC WITH DIFFERENTIAL/PLATELET
BASO%: 0.9 % (ref 0.0–2.0)
Basophils Absolute: 0.1 10*3/uL (ref 0.0–0.1)
EOS ABS: 0.4 10*3/uL (ref 0.0–0.5)
EOS%: 5.6 % (ref 0.0–7.0)
HEMATOCRIT: 33.8 % — AB (ref 38.4–49.9)
HGB: 11.5 g/dL — ABNORMAL LOW (ref 13.0–17.1)
LYMPH#: 1.6 10*3/uL (ref 0.9–3.3)
LYMPH%: 23.1 % (ref 14.0–49.0)
MCH: 33.3 pg (ref 27.2–33.4)
MCHC: 34.2 g/dL (ref 32.0–36.0)
MCV: 97.4 fL (ref 79.3–98.0)
MONO#: 0.7 10*3/uL (ref 0.1–0.9)
MONO%: 10.3 % (ref 0.0–14.0)
NEUT%: 60.1 % (ref 39.0–75.0)
NEUTROS ABS: 4 10*3/uL (ref 1.5–6.5)
Platelets: 232 10*3/uL (ref 140–400)
RBC: 3.47 10*6/uL — ABNORMAL LOW (ref 4.20–5.82)
RDW: 13.4 % (ref 11.0–14.6)
WBC: 6.7 10*3/uL (ref 4.0–10.3)

## 2014-12-10 LAB — FERRITIN CHCC: FERRITIN: 50 ng/mL (ref 22–316)

## 2014-12-10 NOTE — Progress Notes (Signed)
PICC line was pulled by Reesa Chewixie Smith, RN. Catheter was measured at 41.1 cm. Verified length with imaging completed after insertion. Pt tolerated procedure well. Applied pressure for 15 minutes. Dressing applied. Pt laying supine with clean, dry dressing intact. Pt denies any pain.

## 2015-01-20 ENCOUNTER — Other Ambulatory Visit: Payer: Self-pay

## 2015-01-20 MED ORDER — AMLODIPINE BESYLATE 10 MG PO TABS: ORAL_TABLET | ORAL | Status: DC

## 2015-01-20 NOTE — Telephone Encounter (Signed)
[name], MD at 02/10/2014 3:03 PM  amLODipine (NORVASC) 10 MG tablet TAKE 1 TABLET EVERY DAY         Patient Instructions     Your physician recommends that you continue on your current medications as directed. Please refer to the Current Medication list given to you today.   Name: Eugene [name], Eugene [name] MRN: [id number] Date: 02/10/2015 Status: Sch Time: 3:00 PM Length: 15 Visit Type: OFFICE VISIT [1004] Copay: $0.00 Provider: [name], MD Department: [facility] Referring Provider: [name] CSN: [id number] Notes: F/u per recall/pt calling-TR Made On: 11/17/2014 10:23 AM  REQUESTING 90 DAY SUPPLY

## 2015-01-27 ENCOUNTER — Other Ambulatory Visit (HOSPITAL_BASED_OUTPATIENT_CLINIC_OR_DEPARTMENT_OTHER): Payer: Medicare Other

## 2015-01-27 ENCOUNTER — Other Ambulatory Visit: Payer: Self-pay

## 2015-01-27 LAB — CBC WITH DIFFERENTIAL/PLATELET
BASO%: 0.9 % (ref 0.0–2.0)
Basophils Absolute: 0.1 10*3/uL (ref 0.0–0.1)
EOS%: 4.6 % (ref 0.0–7.0)
Eosinophils Absolute: 0.3 10*3/uL (ref 0.0–0.5)
HCT: 40 % (ref 38.4–49.9)
HGB: 13.6 g/dL (ref 13.0–17.1)
LYMPH%: 24 % (ref 14.0–49.0)
MCH: 30.9 pg (ref 27.2–33.4)
MCHC: 34 g/dL (ref 32.0–36.0)
MCV: 91.1 fL (ref 79.3–98.0)
MONO#: 0.6 10*3/uL (ref 0.1–0.9)
MONO%: 10 % (ref 0.0–14.0)
NEUT#: 3.6 10*3/uL (ref 1.5–6.5)
NEUT%: 60.5 % (ref 39.0–75.0)
Platelets: 196 10*3/uL (ref 140–400)
RBC: 4.39 10*6/uL (ref 4.20–5.82)
RDW: 13.6 % (ref 11.0–14.6)
WBC: 6 10*3/uL (ref 4.0–10.3)
lymph#: 1.4 10*3/uL (ref 0.9–3.3)

## 2015-01-27 LAB — FERRITIN CHCC: FERRITIN: 26 ng/mL (ref 22–316)

## 2015-02-03 ENCOUNTER — Ambulatory Visit (HOSPITAL_BASED_OUTPATIENT_CLINIC_OR_DEPARTMENT_OTHER): Payer: Medicare Other | Admitting: Oncology

## 2015-02-03 ENCOUNTER — Telehealth: Payer: Self-pay | Admitting: Oncology

## 2015-02-03 DIAGNOSIS — I4891 Unspecified atrial fibrillation: Secondary | ICD-10-CM

## 2015-02-03 NOTE — Telephone Encounter (Signed)
Appointments made and avs printed for patient °

## 2015-02-03 NOTE — Progress Notes (Signed)
ID: Eugene [name]   DOB: 12-27-1935  MR#: [id number]  WUJ#:[id number]  PCP: Eugene Housekeeper, MD SU:  OTHER MD: Eugene [name], Eugene [name]   HISTORY OF PRESENT ILLNESS: Eugene [name]'s hemochromatosis was documented by labs and genetics in 1989. His family (sister, children) have been tested and all are negative for the mutation  INTERVAL HISTORY: Eugene [name] returns today for followup of his hemochromatosis. His last phlebotomy was 12/10/2014. He required a PICC line because of access issues. Otherwise he tolerated it that this well.  REVIEW OF SYSTEMS: Eugene [name] is doing very well otherwise. He has mild sinus problems, and still has issues with his intermittent atrial fibrillation, which though appears to be better controlled, and he has arthritis pains here in there which are not more intense or persistent than before. He is doing quite a bit of traveling with Eugene [name] and enjoying it. A detailed review of systems today was otherwise stable  PAST MEDICAL HISTORY: Past Medical History  Diagnosis Date  . Hypertension   . Hyperlipidemia   . Hemochromatosis   . AAA (abdominal aortic aneurysm) 06/29/2007    stent graft  . Stroke     Transient Ischemic Attack  . Irritable bowel syndrome   . Chronic atrial fibrillation   . GERD (gastroesophageal reflux disease)     PAST SURGICAL HISTORY: Past Surgical History  Procedure Laterality Date  . Endovascular stent insertion  07/18/2007    saccular infrarenal aortic aneurysm  . Abdominal aortic aneurysm repair    . Picc line place peripheral ([facility] hx)  Oct 21, 2014    FAMILY HISTORY Family History  Problem Relation Age of Onset  . Hypertension Mother   . Heart disease Mother     AAA, he passed in his 34 s of Heart Diseasse  . Heart disease Father     AAA history and he passed at age 41  of   Disease   the patient's father died from a myocardial infarction at age 93. The patient's mother died from lung problems at age 43. He had no brothers,  one sister. There is no history of hemochromatosis in the family to his knowledge.  SOCIAL HISTORY: Eugene [name] has 2 children from his first marriage, Eugene [name], who is a Runner, broadcasting/film/video in [place], with one adopted child, and Eugene [name], who works in Engineer, materials, and has one biologic and one adopted child. Eugene [name]'s second wife, Eugene [name], who was my patient, had 2 children from her prior marriage, and Eugene [name]'s current wife, Eugene [name], also has 2 children of her own. Eugene [name] and Eugene [name] married in 2007. The patient attends a [employer].   ADVANCED DIRECTIVES: not in place  HEALTH MAINTENANCE: Social History  Substance Use Topics  . Smoking status: Former Smoker    Types: Cigarettes    Quit date: 06/18/1999  . Smokeless tobacco: Never Used  . Alcohol Use: 0.0 oz/week    1-2 Standard drinks or equivalent per week    Allergies  Allergen Reactions  . Chlorhexidine Gluconate Itching    Don't use Chloraprep inside port a cath kits.    Current Outpatient Prescriptions  Medication Sig Dispense Refill  . amLODipine (NORVASC) 10 MG tablet TAKE 1 TABLET EVERY DAY 90 tablet 0  . Ascorbic Acid (VITAMIN C) 100 MG tablet Take 100 mg by mouth.     Marland Kitchen atorvastatin (LIPITOR) 20 MG tablet daily.    Marland Kitchen CIALIS 20 MG tablet 20 mg as needed.     . dabigatran (PRADAXA) 150 MG  CAPS Take 150 mg by mouth every 12 (twelve) hours.    Marland Kitchen esomeprazole (NEXIUM) 40 MG capsule Take 40 mg by mouth daily before breakfast.    . hydrochlorothiazide (HYDRODIURIL) 25 MG tablet Take 25 mg by mouth daily.    . hyoscyamine (LEVBID) 0.375 MG 12 hr tablet Take 0.375 mg by mouth as needed.     Marland Kitchen KLOR-CON 10 10 MEQ tablet     . LORazepam (ATIVAN) 0.5 MG tablet Take 0.5 mg by mouth at bedtime.     . metoprolol tartrate (LOPRESSOR) 25 MG tablet Take 25 mg by mouth 2 (two) times daily.     . ramipril (ALTACE) 1.25 MG capsule Take 5 mg by mouth daily.    . ramipril (ALTACE) 5 MG capsule daily.    . rosuvastatin (CRESTOR) 10 MG  tablet Take 10 mg by mouth daily.    Marland Kitchen thiamine (VITAMIN B-1) 100 MG tablet Take 250 mg by mouth daily.    . vitamin E 100 UNIT capsule Take 100 Units by mouth daily.     No current facility-administered medications for this visit.   Facility-Administered Medications Ordered in Other Visits  Medication Dose Route Frequency Provider Last Rate Last Dose  . sodium chloride 0.9 % injection 10 mL  10 mL Intravenous PRN Eugene Dell, MD   10 mL at 11/25/14 1220    OBJECTIVE: Middle-aged white man in no acute distress Filed Vitals:   02/03/15 1130  BP: 132/76  Pulse: 65  Temp: 97.5 F (36.4 C)  Resp: 18     Body mass index is 27.41 kg/(m^2).    ECOG FS: 0  Sclerae unicteric, pupils round and equal Oropharynx clear and moist-- no thrush or other lesions No cervical or supraclavicular adenopathy Lungs no rales or rhonchi Heart regular rate and rhythm Abd soft, nontender, positive bowel sounds MSK no focal spinal tenderness, no upper extremity lymphedema Neuro: nonfocal, well oriented, appropriate affect     LAB RESULTS: Results for Eugene, [name] (MRN [id number]) as of 02/03/2015 11:29  Ref. Range 11/18/2014 12:37 11/25/2014 11:36 12/02/2014 10:59 12/10/2014 13:43 01/27/2015 11:53  Ferritin Latest Ref Range: 22-316 ng/ml 88 77 59 50 26    Lab Results  Component Value Date   WBC 6.0 01/27/2015   NEUTROABS 3.6 01/27/2015   HGB 13.6 01/27/2015   HCT 40.0 01/27/2015   MCV 91.1 01/27/2015   PLT 196 01/27/2015      Chemistry      Component Value Date/Time   NA 139 10/15/2014 1304   NA 137 07/20/2013 0930   K 3.8 10/15/2014 1304   K 3.6 07/20/2013 0930   CL 100 07/20/2013 0930   CO2 26 10/15/2014 1304   CO2 27 07/20/2013 0930   BUN 14.9 10/15/2014 1304   BUN 14 07/20/2013 0930   CREATININE 1.0 10/15/2014 1304   CREATININE 1.1 07/20/2013 0930      Component Value Date/Time   CALCIUM 10.3 10/15/2014 1304   CALCIUM 10.2 07/20/2013 0930   ALKPHOS 45 10/15/2014 1304    ALKPHOS 40 02/15/2011 1325   AST 19 10/15/2014 1304   AST 22 02/15/2011 1325   ALT 17 10/15/2014 1304   ALT 17 02/15/2011 1325   BILITOT 1.45* 10/15/2014 1304   BILITOT 1.7* 02/15/2011 1325       No results found for: LABCA2  No components found for: LABCA125  No results for input(s): INR in the last 168 hours.  Urinalysis    Component Value Date/Time  COLORURINE YELLOW 07/15/2007 1041   APPEARANCEUR CLEAR 07/15/2007 1041   LABSPEC 1.007 07/15/2007 1041   PHURINE 7.5 07/15/2007 1041   GLUCOSEU NEGATIVE 07/15/2007 1041   HGBUR TRACE* 07/15/2007 1041   BILIRUBINUR NEGATIVE 07/15/2007 1041   KETONESUR NEGATIVE 07/15/2007 1041   PROTEINUR NEGATIVE 07/15/2007 1041   UROBILINOGEN 0.2 07/15/2007 1041   NITRITE NEGATIVE 07/15/2007 1041   LEUKOCYTESUR NEGATIVE 07/15/2007 1041    STUDIES:  No results found.   ASSESSMENT: 79 y.o. [place] man with a history of hemochromatosis diagnosed in 1989, treated with intermittent phlebotomies.   (1) also history of abdominal aortic aneurysm, status post stent placement  (2) history of irritable bowel syndrome, intermittently symptomatic  (3) atrial fibrillation  (4) poor access, requiring PICC during phlebotomies  PLAN:  Eugene [name] had a very good response to his phlebotomies and likely will not need phlebotomy for another couple of years. We'll  go back to checking his lab work every 6 months.  He knows how to avoid foods and utensils that would more rapidly increase his iron level.  If we do resume phlebotomies at some point in the future he will again need a PICC line. Also, I think he would benefit from 250-500 mL of normal saline after each phlebotomy: He did have a little harder time recovering this time than he did previously.  Otherwise he knows to call for any problems that may develop before his next visit here.  Amri Lien C    02/03/2015

## 2015-02-09 NOTE — Progress Notes (Addendum)
Cardiology Office Note   Date:  02/10/2015   ID:  Eugene [name], DOB 1935/11/26, MRN [id number]  PCP:  [name], MD    Chief Complaint  Patient presents with  . Atrial Fibrillation      History of Present Illness: This is a 79yo male with a history of HTN, chronic atrial fibrillation s/p failed DCCV x2 on chronic anticoagulation who presents today for followup. He is doing well. He denies any chest pain, SOB, DOE, LE edema, dizziness, palpitations or syncope. He walks usually everyday for exercise.     Past Medical History  Diagnosis Date  . Hypertension   . Hyperlipidemia   . Hemochromatosis   . AAA (abdominal aortic aneurysm) 06/29/2007    stent graft  . Stroke     Transient Ischemic Attack  . Irritable bowel syndrome   . Chronic atrial fibrillation   . GERD (gastroesophageal reflux disease)     Past Surgical History  Procedure Laterality Date  . Endovascular stent insertion  07/18/2007    saccular infrarenal aortic aneurysm  . Abdominal aortic aneurysm repair    . Picc line place peripheral ([facility] hx)  Oct 21, 2014     Current Outpatient Prescriptions  Medication Sig Dispense Refill  . amLODipine (NORVASC) 10 MG tablet TAKE 1 TABLET EVERY DAY 90 tablet 0  . Ascorbic Acid (VITAMIN C) 100 MG tablet Take 100 mg by mouth.     Marland Kitchen atorvastatin (LIPITOR) 20 MG tablet daily.    Marland Kitchen CIALIS 20 MG tablet 20 mg as needed.     . dabigatran (PRADAXA) 150 MG CAPS Take 150 mg by mouth every 12 (twelve) hours.    Marland Kitchen esomeprazole (NEXIUM) 40 MG capsule Take 40 mg by mouth daily before breakfast.    . hydrochlorothiazide (HYDRODIURIL) 25 MG tablet Take 25 mg by mouth daily.    . hyoscyamine (LEVBID) 0.375 MG 12 hr tablet Take 0.375 mg by mouth as needed.     Marland Kitchen KLOR-CON 10 10 MEQ tablet     . LORazepam (ATIVAN) 0.5 MG tablet Take 0.5 mg by mouth at bedtime.     . metoprolol tartrate (LOPRESSOR) 25 MG tablet Take 25 mg by mouth 2 (two) times daily.       . ramipril (ALTACE) 1.25 MG capsule Take 5 mg by mouth daily.    . ramipril (ALTACE) 5 MG capsule daily.    . rosuvastatin (CRESTOR) 10 MG tablet Take 10 mg by mouth daily.    Marland Kitchen thiamine (VITAMIN B-1) 100 MG tablet Take 250 mg by mouth daily.    . vitamin E 100 UNIT capsule Take 100 Units by mouth daily.    Marland Kitchen ALPRAZolam (XANAX) 0.5 MG tablet Take 0.5 mg by mouth as needed. For anxiety     No current facility-administered medications for this visit.   Facility-Administered Medications Ordered in Other Visits  Medication Dose Route Frequency Provider Last Rate Last Dose  . sodium chloride 0.9 % injection 10 mL  10 mL Intravenous PRN Lowella Dell, MD   10 mL at 11/25/14 1220    Allergies:   Chlorhexidine gluconate    Social History:  The patient  reports that he quit smoking about 15 years ago. His smoking use included Cigarettes. He has never used smokeless tobacco. He reports that he drinks alcohol. He reports that he does not use illicit drugs.   Family History:  The patient's family history includes Heart disease in his father and mother; Hypertension in his mother.    ROS:  Please see the history of present illness.   Otherwise, review of systems are positive for none.   All other systems are reviewed and negative.    PHYSICAL EXAM: VS:  BP 120/72 mmHg  Pulse 65  Ht  (1.854 m)  Wt 210 lb (95.255 kg)  BMI 27.71 kg/m2 , BMI Body mass index is 27.71 kg/(m^2). GEN: Well nourished, well developed, in no acute distress HEENT: normal Neck: no JVD, carotid bruits, or masses Cardiac: RRR; no murmurs, rubs, or gallops,no edema  Respiratory:  clear to auscultation bilaterally, normal work of breathing GI: soft, nontender, nondistended, + BS MS: no deformity or atrophy Skin: warm and dry, no rash Neuro:  Strength and sensation are intact Psych: euthymic mood, full affect   EKG:  EKG was ordered today and showed atrial fibrillation with IRBBB    Recent  Labs: 10/15/2014: ALT 17; BUN 14.9; Creatinine 1.0; Potassium 3.8; Sodium 139 01/27/2015: HGB 13.6; Platelets 196    Lipid Panel No results found for: CHOL, TRIG, HDL, CHOLHDL, VLDL, LDLCALC, LDLDIRECT    Wt Readings from Last 3 Encounters:  02/10/15 210 lb (95.255 kg)  02/03/15 207 lb 11.2 oz (94.212 kg)  11/01/14 207 lb (93.895 kg)    ASSESSMENT AND PLAN:  1. Chronic atrial fibrillation rate controlled - continue Pradaxa/metoprolol 2. Chronic systemic anticoagulation 3. HTN well controlled - continue amlodipine/metoprolol/altace/HCTZ    Current medicines are reviewed at length with the patient today.  The patient does not have concerns regarding medicines.  The following changes have been made:  no change  Labs/ tests ordered today: See above Assessment and Plan  Orders Placed This Encounter  Procedures  . EKG 12-Lead     Disposition:   FU with me in 1 year  Signed, [name], MD  02/10/2015 4:00 PM    [facility] [address] [place], [place], [place]  [address] Phone: [phone]; Fax: [phone]

## 2015-02-10 ENCOUNTER — Ambulatory Visit (INDEPENDENT_AMBULATORY_CARE_PROVIDER_SITE_OTHER): Payer: Medicare Other | Admitting: Cardiology

## 2015-02-10 ENCOUNTER — Encounter: Payer: Self-pay | Admitting: Cardiology

## 2015-02-10 VITALS — BP 120/72 | HR 65 | Ht 73.0 in | Wt 210.0 lb

## 2015-02-10 DIAGNOSIS — I10 Essential (primary) hypertension: Secondary | ICD-10-CM

## 2015-02-10 DIAGNOSIS — I482 Chronic atrial fibrillation, unspecified: Secondary | ICD-10-CM

## 2015-02-10 NOTE — Patient Instructions (Signed)
Medication Instructions:  Your physician recommends that you continue on your current medications as directed. Please refer to the Current Medication list given to you today.   Labwork: None  Testing/Procedures: None  Follow-Up: Your physician wants you to follow-up in: 1 year with Dr. Mayford Knife. You will receive a reminder letter in the mail two months in advance. If you don't receive a letter, please call our office to schedule the follow-up appointment.   Any Other Special Instructions Will Be Listed Below (If Applicable). Please look at your Altace (ramipril) when you get home. Please call our office tomorrow during business hours to verify the dosage. [phone]. Thank you!

## 2015-02-11 ENCOUNTER — Other Ambulatory Visit: Payer: Self-pay

## 2015-04-08 ENCOUNTER — Telehealth: Payer: Self-pay | Admitting: Cardiology

## 2015-04-08 DIAGNOSIS — I10 Essential (primary) hypertension: Secondary | ICD-10-CM

## 2015-04-08 NOTE — Telephone Encounter (Signed)
BMET scheduled for next Wednesday. Patient agrees with treatment plan.

## 2015-04-08 NOTE — Telephone Encounter (Signed)
He will need to come in for a BMET to determine dose of Eliquis

## 2015-04-08 NOTE — Telephone Encounter (Signed)
Patient is currently taking Pradaxa, but his insurance informed him it will increase in price substantially for him at the beginning of the year. He is requesting to be switched to Eliquis or Xarelto. Informed patient Dr. Mayford Knifeurner is not here today, but he will be contacted next week with Dr. Norris Crossurner's instructions.  To Dr. Mayford Knifeurner.

## 2015-04-08 NOTE — Telephone Encounter (Signed)
New problem    Pt need to talk to you concerning an alternative for a drug he is taking.

## 2015-04-13 ENCOUNTER — Other Ambulatory Visit (INDEPENDENT_AMBULATORY_CARE_PROVIDER_SITE_OTHER): Payer: Medicare Other | Admitting: *Deleted

## 2015-04-13 DIAGNOSIS — I10 Essential (primary) hypertension: Secondary | ICD-10-CM | POA: Diagnosis not present

## 2015-04-13 LAB — BASIC METABOLIC PANEL
BUN: 13 mg/dL (ref 7–25)
CALCIUM: 10 mg/dL (ref 8.6–10.3)
CO2: 28 mmol/L (ref 20–31)
Chloride: 97 mmol/L — ABNORMAL LOW (ref 98–110)
Creat: 1.12 mg/dL (ref 0.70–1.18)
Glucose, Bld: 153 mg/dL — ABNORMAL HIGH (ref 65–99)
POTASSIUM: 3.3 mmol/L — AB (ref 3.5–5.3)
Sodium: 135 mmol/L (ref 135–146)

## 2015-04-14 ENCOUNTER — Telehealth: Payer: Self-pay

## 2015-04-14 DIAGNOSIS — I10 Essential (primary) hypertension: Secondary | ICD-10-CM

## 2015-04-14 NOTE — Telephone Encounter (Signed)
Confirmed with patient he is taking Klor-con 10 meq daily. Instructed patient to increase K to 20 meq for two days and then resume 10 meq daily. Repeat BMET scheduled for next Wednesday. Patient agrees with treatment plan.

## 2015-04-19 ENCOUNTER — Telehealth: Payer: Self-pay | Admitting: Cardiology

## 2015-04-19 NOTE — Telephone Encounter (Signed)
New message      Calling to see if Dr Mayford Knifeurner ok'd pt to switch from prodaxa to eliquis.  If yes, please call in presc for eliquis to [facility].

## 2015-04-20 ENCOUNTER — Other Ambulatory Visit (INDEPENDENT_AMBULATORY_CARE_PROVIDER_SITE_OTHER): Payer: Medicare Other | Admitting: *Deleted

## 2015-04-20 DIAGNOSIS — I10 Essential (primary) hypertension: Secondary | ICD-10-CM | POA: Diagnosis not present

## 2015-04-20 LAB — BASIC METABOLIC PANEL
BUN: 11 mg/dL (ref 7–25)
CHLORIDE: 97 mmol/L — AB (ref 98–110)
CO2: 27 mmol/L (ref 20–31)
Calcium: 10 mg/dL (ref 8.6–10.3)
Creat: 1.09 mg/dL (ref 0.70–1.18)
Glucose, Bld: 78 mg/dL (ref 65–99)
POTASSIUM: 3.7 mmol/L (ref 3.5–5.3)
Sodium: 135 mmol/L (ref 135–146)

## 2015-04-20 MED ORDER — APIXABAN 5 MG PO TABS: 5.0000 mg | ORAL_TABLET | Freq: Two times a day (BID) | ORAL | Status: DC

## 2015-04-20 NOTE — Telephone Encounter (Signed)
Called patient to review medication directions and to schedule follow-up visit. Patient st his pharmacy called stating he needs a prior authorization done before dispensing Eliquis. Informed the patient the PA nurse will be notified. He understands to call when he starts his medicine to schedule 1 month appointment.  To PA RN.

## 2015-04-20 NOTE — Telephone Encounter (Signed)
Per Dr. Mayford Knifeurner, patient to STOP PRADAXA and START ELIQUIS 5 mg BID. One month Eliquis appointment to be made with the [facility].

## 2015-04-20 NOTE — Telephone Encounter (Signed)
Ok to change to Eliquis 5mg  BID

## 2015-04-25 ENCOUNTER — Telehealth: Payer: Self-pay

## 2015-04-25 NOTE — Telephone Encounter (Signed)
Prior auth for Eliquis 5 mg sent to [facility]. 

## 2015-04-25 NOTE — Telephone Encounter (Signed)
Prior auth for Eliquis initiated today.

## 2015-04-26 ENCOUNTER — Telehealth: Payer: Self-pay

## 2015-04-26 NOTE — Telephone Encounter (Signed)
Eliquis approved by [facility] through 04/24/2016. PA# [id number]

## 2015-05-21 ENCOUNTER — Other Ambulatory Visit: Payer: Self-pay | Admitting: Cardiology

## 2015-07-19 ENCOUNTER — Ambulatory Visit
Admission: RE | Admit: 2015-07-19 | Discharge: 2015-07-19 | Disposition: A | Discharge status: Home / Self Care | Payer: Medicare Other | Source: Ambulatory Visit | Attending: Internal Medicine | Admitting: Internal Medicine

## 2015-07-19 ENCOUNTER — Other Ambulatory Visit: Payer: Self-pay | Admitting: Internal Medicine

## 2015-07-19 DIAGNOSIS — M25562 Pain in left knee: Secondary | ICD-10-CM

## 2015-07-28 ENCOUNTER — Other Ambulatory Visit (HOSPITAL_BASED_OUTPATIENT_CLINIC_OR_DEPARTMENT_OTHER): Payer: Medicare Other

## 2015-07-28 LAB — CBC WITH DIFFERENTIAL/PLATELET
BASO%: 0.6 % (ref 0.0–2.0)
BASOS ABS: 0 10*3/uL (ref 0.0–0.1)
EOS ABS: 0.3 10*3/uL (ref 0.0–0.5)
EOS%: 4.1 % (ref 0.0–7.0)
HCT: 42.3 % (ref 38.4–49.9)
HEMOGLOBIN: 14.7 g/dL (ref 13.0–17.1)
LYMPH%: 22.6 % (ref 14.0–49.0)
MCH: 33.2 pg (ref 27.2–33.4)
MCHC: 34.6 g/dL (ref 32.0–36.0)
MCV: 95.8 fL (ref 79.3–98.0)
MONO#: 0.7 10*3/uL (ref 0.1–0.9)
MONO%: 11.2 % (ref 0.0–14.0)
NEUT#: 3.8 10*3/uL (ref 1.5–6.5)
NEUT%: 61.5 % (ref 39.0–75.0)
PLATELETS: 192 10*3/uL (ref 140–400)
RBC: 4.42 10*6/uL (ref 4.20–5.82)
RDW: 13.5 % (ref 11.0–14.6)
WBC: 6.2 10*3/uL (ref 4.0–10.3)
lymph#: 1.4 10*3/uL (ref 0.9–3.3)

## 2015-07-28 LAB — FERRITIN: FERRITIN: 70 ng/mL (ref 22–316)

## 2015-10-25 ENCOUNTER — Encounter: Payer: Self-pay | Admitting: Vascular Surgery

## 2015-11-01 ENCOUNTER — Ambulatory Visit (HOSPITAL_COMMUNITY)
Admission: RE | Admit: 2015-11-01 | Discharge: 2015-11-01 | Disposition: A | Discharge status: Home / Self Care | Payer: Medicare Other | Source: Ambulatory Visit | Attending: Family | Admitting: Family

## 2015-11-01 ENCOUNTER — Encounter: Payer: Self-pay | Admitting: Family

## 2015-11-01 ENCOUNTER — Ambulatory Visit (INDEPENDENT_AMBULATORY_CARE_PROVIDER_SITE_OTHER): Payer: Medicare Other | Admitting: Family

## 2015-11-01 VITALS — BP 112/76 | HR 66 | Temp 97.9°F | Resp 16 | Ht 72.0 in | Wt 198.0 lb

## 2015-11-01 DIAGNOSIS — Z87891 Personal history of nicotine dependence: Secondary | ICD-10-CM | POA: Diagnosis not present

## 2015-11-01 DIAGNOSIS — I714 Abdominal aortic aneurysm, without rupture, unspecified: Secondary | ICD-10-CM

## 2015-11-01 DIAGNOSIS — Z95828 Presence of other vascular implants and grafts: Secondary | ICD-10-CM

## 2015-11-01 DIAGNOSIS — Z48812 Encounter for surgical aftercare following surgery on the circulatory system: Secondary | ICD-10-CM

## 2015-11-01 DIAGNOSIS — Z4889 Encounter for other specified surgical aftercare: Secondary | ICD-10-CM | POA: Insufficient documentation

## 2015-11-01 DIAGNOSIS — E785 Hyperlipidemia, unspecified: Secondary | ICD-10-CM | POA: Insufficient documentation

## 2015-11-01 DIAGNOSIS — I482 Chronic atrial fibrillation: Secondary | ICD-10-CM | POA: Diagnosis not present

## 2015-11-01 DIAGNOSIS — I10 Essential (primary) hypertension: Secondary | ICD-10-CM | POA: Insufficient documentation

## 2015-11-01 DIAGNOSIS — K219 Gastro-esophageal reflux disease without esophagitis: Secondary | ICD-10-CM | POA: Insufficient documentation

## 2015-11-01 NOTE — Patient Instructions (Signed)
Simethicone dissolving film What is this medicine? SIMETHICONE (sye METH i kone) is used to decrease the discomfort caused by gas. This medicine may be used for other purposes; ask your health care provider or pharmacist if you have questions. What should I tell my health care provider before I take this medicine? They need to know if you have any of these conditions: -an unusual or allergic reaction to simethicone, other medicines, foods, dyes, or preservatives -pregnant or trying to get pregnant -breast-feeding How should I use this medicine? Take this medicine by mouth. Allow the medicine to melt into your mouth before swallowing. Follow the directions on the label or those given to you by your doctor or health care professional. Do not take your medicine more often than directed. Talk to your pediatrician regarding the use of this medicine in children. Special care may be needed. Overdosage: If you think you have taken too much of this medicine contact a poison control center or emergency room at once. NOTE: This medicine is only for you. Do not share this medicine with others. What if I miss a dose? This does not apply. You will only use this medicine as needed for gas pain. Do not use double or extra doses. What may interact with this medicine? Interactions are not expected. This list may not describe all possible interactions. Give your health care provider a list of all the medicines, herbs, non-prescription drugs, or dietary supplements you use. Also tell them if you smoke, drink alcohol, or use illegal drugs. Some items may interact with your medicine. What should I watch for while using this medicine? Tell your doctor or health care professional if your symptoms get worse, or if you have severe pain, diarrhea, constipation, or blood in your stool. These could be signs of a more serious condition. What side effects may I notice from receiving this medicine? There are no reported side  effects of this medicine. This list may not describe all possible side effects. Call your doctor for medical advice about side effects. You may report side effects to FDA at 1-800-FDA-1088. Where should I keep my medicine? Keep out of the reach of children. Store at room temperature between 15 and 30 degrees C (59 and 86 degrees F). Keep container tightly closed. Protect from moisture. Throw away any unused medicine after the expiration date. NOTE: This sheet is a summary. It may not cover all possible information. If you have questions about this medicine, talk to your doctor, pharmacist, or health care provider.    2016, Elsevier/Gold Standard. (2008-01-16 14:00:4)     Instructions to reduce gas before your next abdominal ultrasound  The night before the test, at bedtime, take 2 Extra-Strength Gas-X capsules; take another 2 capsules about 3 hours before the test.

## 2015-11-01 NOTE — Progress Notes (Signed)
[facility] [place]  CC: Follow up EVAR  History of Present Illness  Eugene [name] is a 80 y.o. (Jul 07, 1935) male patient of Dr. Myra Gianotti. He is a former patient of Dr. Madilyn Fireman. He is status post endovascular aneurysm repair on 06/29/2007 using an Endologix device. He is back today for followup. Most recent EVAR duplex (Date: 10/28/13) demonstrates: no endoleak and 2.6 cm sac size.  Most recent CTA (Date: 08/28/2007) demonstrates:Endograft stent in place infrarenally within the  abdominal aorta. The aneurysm sac size currently measures 3.8 cm  compared to of 4.2 cm maximally previously. No evidence of  endoleak. Renal arteries are widely patent. IMA is occluded, with  branch filling collaterally via SMA collaterals. The stent graft ends in the common iliac arteries  bilaterally which are heavily calcified and tortuous. No evidence  of aneurysm or significant stenosis. Heavily calcified internal  iliac arteries noted, patent. Mild disease within the common  femoral arteries bilaterally.  The patient denies back or abdominal pain. Pt states he is physically active. 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 He denies claudication symptoms with walking.   He has hemachromatosis, diagnosed in the 1990's.  He has IBS, states this is the likely cause of his gas.  Pt Diabetic: No Pt smoker: former smoker, quit in 2001 He takes Eliquis for atrial fib, Dr. Mayford Knife is his cardiologist.   Pt denies claudication symptoms in his legs with walking. He had a stroke in 2001 as manifested by right eye limited movement; he then had several TIA's afterward, his last TIA was about January 2015.  Pt states his cardiologist is checking his carotid arteries by [place].   Past Medical History  Diagnosis Date  . Hypertension   . Hyperlipidemia   . Hemochromatosis   . AAA  (abdominal aortic aneurysm) (HCC) 06/29/2007    stent graft  . Stroke [facility])     Transient Ischemic Attack  . Irritable bowel syndrome   . Chronic atrial fibrillation (HCC)   . GERD (gastroesophageal reflux disease)    Past Surgical History  Procedure Laterality Date  . Endovascular stent insertion  07/18/2007    saccular infrarenal aortic aneurysm  . Abdominal aortic aneurysm repair    . Picc line place peripheral ([facility] hx)  Oct 21, 2014  . Eye surgery Bilateral     Cataract     Social History Social History  Substance Use Topics  . Smoking status: Former Smoker    Types: Cigarettes    Quit date: 06/18/1999  . Smokeless tobacco: Never Used  . Alcohol Use: 0.0 oz/week    1-2 Standard drinks or equivalent per week   Family History Family History  Problem Relation Age of Onset  . Hypertension Mother   . Heart disease Mother     AAA, he passed in his 23 s of Heart Diseasse  . Heart disease Father     AAA history and he passed at age 65  of   Disease   Current Outpatient Prescriptions on File Prior to Visit  Medication Sig Dispense Refill  . ALPRAZolam (XANAX) 0.5 MG tablet Take 0.5 mg by mouth as needed. For anxiety    . amLODipine (NORVASC) 10 MG tablet TAKE 1 TABLET BY MOUTH EVERY DAY 90 tablet 2  . apixaban (ELIQUIS) 5 MG TABS tablet Take 1 tablet (5 mg total) by mouth 2 (two)  times daily. 60 tablet 11  . Ascorbic Acid (VITAMIN C) 100 MG tablet Take 100 mg by mouth.     Marland Kitchen. atorvastatin (LIPITOR) 20 MG tablet daily.    Marland Kitchen. CIALIS 20 MG tablet 20 mg as needed.     Marland Kitchen. esomeprazole (NEXIUM) 40 MG capsule Take 40 mg by mouth daily before breakfast.    . hydrochlorothiazide (HYDRODIURIL) 25 MG tablet Take 25 mg by mouth daily.    . hyoscyamine (LEVBID) 0.375 MG 12 hr tablet Take 0.375 mg by mouth as needed.     Marland Kitchen. KLOR-CON 10 10 MEQ tablet     . LORazepam (ATIVAN) 0.5 MG tablet Take 0.5 mg by mouth at bedtime.     . metoprolol tartrate (LOPRESSOR) 25 MG tablet Take 25 mg by mouth 2  (two) times daily.     . ramipril (ALTACE) 5 MG capsule daily.    Marland Kitchen. thiamine (VITAMIN B-1) 100 MG tablet Take 250 mg by mouth daily.    . vitamin E 100 UNIT capsule Take 100 Units by mouth daily.     Current Facility-Administered Medications on File Prior to Visit  Medication Dose Route Frequency Provider Last Rate Last Dose  . sodium chloride 0.9 % injection 10 mL  10 mL Intravenous PRN Lowella DellGustav C Magrinat, MD   10 mL at 11/25/14 1220   Allergies  Allergen Reactions  . Chlorhexidine Gluconate Itching    Don't use Chloraprep inside port a cath kits.  . Tape     Adhesive tape,   Only use paper tape     ROS: See HPI for pertinent positives and negatives.  Physical Examination  Filed Vitals:   11/01/15 0924  BP: 112/76  Pulse: 66  Temp: 97.9 F (36.6 C)  TempSrc: Oral  Resp: 16  Height: 6' (1.829 m)  Weight: 198 lb (89.812 kg)  SpO2: 97%   Body mass index is 26.85 kg/(m^2).  General: A&O x 3, WD.  Pulmonary: Sym exp, good air movt, CTAB, no rales, rhonchi, or wheezing.   Cardiac: Irregular rhythm, no murmur detected.  Vascular: Vessel Right Left  Radial 2+Palpable 2+Palpable  Carotid without bruit without bruit  Femoral Not palpable  2+Palpable   Aorta Not palpable N/A  Popliteal Not palpable Not palpable  PT 2+Palpable Not Palpable  DP 1+Palpable 1+Palpable   Gastrointestinal: soft, NTND, -G/R, - HSM, - palpable masses, - CVAT B   Musculoskeletal: M/S 5/5 throughout, Extremities without ischemic changes  Neurologic: Pain and light touch intact in extremities, CN 2-12 intact, Motor exam as listed above               Non-Invasive Vascular Imaging  EVAR Duplex (Date: 11/01/2015) ABDOMINAL AORTA DUPLEX EVALUATION - POST ENDOVASCULAR REPAIR    INDICATION: Stent repair of abdominal aortic aneurysm    PREVIOUS INTERVENTION(S): Stent repair of abdominal aortic aneurysm on 07/18/2007.    DUPLEX EXAM:       DIAMETER AP (cm) DIAMETER TRANSVERSE (cm) VELOCITIES (cm/sec)  Aorta 2.6 2.5 46  Right Common Iliac 0.9 0.9 102  Left Common Iliac 1.1 0.9 80    Comparison Study  Ultrasound     Date DIAMETER AP (cm) DIAMETER TRANSVERSE (cm)  10/28/2013 2.6 2.6     ADDITIONAL FINDINGS: Technically limited and difficult study due to overlying bowel gas.    IMPRESSION: Patent stent of the abdominal aorta with a maximum diameter of 2.6 x 2.5 cm.    Compared to the previous exam:  No significant change in  comparison to the last exam on 11/01/2014.     Medical Decision Making  Eugene [name] is a 80 y.o. male who presents s/p EVAR (Date: 06/29/2007).  Pt is asymptomatic with stable sacsize.  I discussed with the patient the importance of surveillance of the endograft.  The next endograft duplex will be scheduled for 12 months.  The patient will follow up with [place] in 12 months with these studies.  I emphasized the importance of maximal medical management including strict control of blood pressure, blood glucose, and lipid levels, antiplatelet agents, obtaining regular exercise, and cessation of smoking.   Thank you for allowing [place] to participate in this patient's care.  Charisse March, RN, MSN, FNP-C [facility] [place] Office: [phone]  Clinic Physician: Hart Rochester  11/01/2015, 9:34 AM

## 2016-01-31 ENCOUNTER — Other Ambulatory Visit (HOSPITAL_BASED_OUTPATIENT_CLINIC_OR_DEPARTMENT_OTHER): Payer: Medicare Other

## 2016-01-31 LAB — CBC WITH DIFFERENTIAL/PLATELET
BASO%: 0.3 % (ref 0.0–2.0)
BASOS ABS: 0 10*3/uL (ref 0.0–0.1)
EOS ABS: 0.2 10*3/uL (ref 0.0–0.5)
EOS%: 2.5 % (ref 0.0–7.0)
HEMATOCRIT: 41.4 % (ref 38.4–49.9)
HEMOGLOBIN: 14.9 g/dL (ref 13.0–17.1)
LYMPH#: 1.6 10*3/uL (ref 0.9–3.3)
LYMPH%: 23.1 % (ref 14.0–49.0)
MCH: 33.2 pg (ref 27.2–33.4)
MCHC: 36 g/dL (ref 32.0–36.0)
MCV: 92.2 fL (ref 79.3–98.0)
MONO#: 0.5 10*3/uL (ref 0.1–0.9)
MONO%: 8 % (ref 0.0–14.0)
NEUT%: 66.1 % (ref 39.0–75.0)
NEUTROS ABS: 4.5 10*3/uL (ref 1.5–6.5)
PLATELETS: 180 10*3/uL (ref 140–400)
RBC: 4.49 10*6/uL (ref 4.20–5.82)
RDW: 13.1 % (ref 11.0–14.6)
WBC: 6.7 10*3/uL (ref 4.0–10.3)

## 2016-01-31 LAB — FERRITIN: Ferritin: 121 ng/ml (ref 22–316)

## 2016-02-07 ENCOUNTER — Telehealth: Payer: Self-pay | Admitting: Oncology

## 2016-02-07 ENCOUNTER — Ambulatory Visit (HOSPITAL_BASED_OUTPATIENT_CLINIC_OR_DEPARTMENT_OTHER): Payer: Medicare Other | Admitting: Oncology

## 2016-02-07 NOTE — Progress Notes (Signed)
ID: Eugene [name]   DOB: [birth year]  MR#: [id number]  HYQ#:[id number]  PCP: Eugene Housekeeper, MD SU:  OTHER MD: Eugene [name], Eugene [name]     INTERVAL HISTORY: Eugene [name] returns today for followup of his hemochromatosis. He has not required phlebotomy for about a year. However his ferritin levels are slowly rising and he very likely will. The 150 trigger later this year or early the next.  Recall he required a PICC line because of access issues. Otherwise he tolerates phlebotomy well  REVIEW OF SYSTEMS: Eugene [name] is back to dealing with the irritable bowel syndrome, which is to some extent disabling for him. For example his wife and her family just went on a trip to [place] and he did not feel he could go because of that problem. He continues to work closely with Dr. Loreta [name] on this. Aside from that a detailed review of systems today was benign.  HISTORY OF PRESENT ILLNESS: Eugene [name]'s hemochromatosis was documented by labs and genetics in 1989. His family (sister, children) have been tested and all are negative for the mutation  PAST MEDICAL HISTORY: Past Medical History:  Diagnosis Date  . AAA (abdominal aortic aneurysm) (HCC) 06/29/2007   stent graft  . Chronic atrial fibrillation (HCC)   . GERD (gastroesophageal reflux disease)   . Hemochromatosis   . Hyperlipidemia   . Hypertension   . Irritable bowel syndrome   . Stroke (HCC)    Transient Ischemic Attack    PAST SURGICAL HISTORY: Past Surgical History:  Procedure Laterality Date  . ABDOMINAL AORTIC ANEURYSM REPAIR    . ENDOVASCULAR STENT INSERTION  07/18/2007   saccular infrarenal aortic aneurysm  . EYE SURGERY Bilateral    Cataract    . PICC LINE PLACE PERIPHERAL ([facility] HX)  Oct 21, 2014    FAMILY HISTORY Family History  Problem Relation Age of Onset  . Hypertension Mother   . Heart disease Mother     AAA, he passed in his 79 s of Heart Diseasse  . Heart disease Father     AAA history and he passed at age 46  of    Disease   the patient's father died from a myocardial infarction at age 38. The patient's mother died from lung problems at age 37. He had no brothers, one sister. There is no history of hemochromatosis in the family to his knowledge.  SOCIAL HISTORY: Eugene [name] has 2 children from his first marriage, Eugene [name], who is a Runner, broadcasting/film/video in [place], with one adopted child, and Eugene [name], who works in Engineer, materials, and has one biologic and one adopted child. Eugene [name]'s second wife, Eugene [name], who was my patient, had 2 children from her prior marriage, and Eugene [name]'s current wife, Eugene [name], also has 2 children of her own. Eugene [name] and Eugene [name] married in 2007. The patient attends a [employer].   ADVANCED DIRECTIVES: not in place  HEALTH MAINTENANCE: Social History  Substance Use Topics  . Smoking status: Former Smoker    Types: Cigarettes    Quit date: 06/18/1999  . Smokeless tobacco: Never Used  . Alcohol use 0.0 oz/week    1 - 2 Standard drinks or equivalent per week    Allergies  Allergen Reactions  . Chlorhexidine Gluconate Itching    Don't use Chloraprep inside port a cath kits.  . Tape     Adhesive tape,   Only use paper tape    Current Outpatient Prescriptions  Medication Sig Dispense Refill  . ALPRAZolam (XANAX) 0.5 MG  tablet Take 0.5 mg by mouth as needed. For anxiety    . amLODipine (NORVASC) 10 MG tablet TAKE 1 TABLET BY MOUTH EVERY DAY 90 tablet 2  . apixaban (ELIQUIS) 5 MG TABS tablet Take 1 tablet (5 mg total) by mouth 2 (two) times daily. 60 tablet 11  . Ascorbic Acid (VITAMIN [name]) 100 MG tablet Take 100 mg by mouth.     Marland Kitchen atorvastatin (LIPITOR) 20 MG tablet daily.    Marland Kitchen CIALIS 20 MG tablet 20 mg as needed.     Marland Kitchen esomeprazole (NEXIUM) 40 MG capsule Take 40 mg by mouth daily before breakfast.    . hydrochlorothiazide (HYDRODIURIL) 25 MG tablet Take 25 mg by mouth daily.    . hyoscyamine (LEVBID) 0.375 MG 12 hr tablet Take 0.375 mg by mouth as needed.     Marland Kitchen KLOR-CON 10 10 MEQ  tablet     . LORazepam (ATIVAN) 0.5 MG tablet Take 0.5 mg by mouth at bedtime.     . metoprolol tartrate (LOPRESSOR) 25 MG tablet Take 25 mg by mouth 2 (two) times daily.     . ramipril (ALTACE) 5 MG capsule daily.    Marland Kitchen thiamine (VITAMIN B-1) 100 MG tablet Take 250 mg by mouth daily.    . vitamin E 100 UNIT capsule Take 100 Units by mouth daily.     No current facility-administered medications for this visit.    Facility-Administered Medications Ordered in Other Visits  Medication Dose Route Frequency Provider Last Rate Last Dose  . sodium chloride 0.9 % injection 10 mL  10 mL Intravenous PRN Eugene Dell, MD   10 mL at 11/25/14 1220    OBJECTIVE: Middle-aged white manWho appears stated age Vitals:   02/07/16 1213  BP: 125/72  Pulse: 72  Resp: 18  Temp: 97.6 F (36.4 [name])     Body mass index is 27.02 kg/m.    ECOG FS: 1  Sclerae unicteric, EOMs intact Oropharynx clear and moist No cervical or supraclavicular adenopathy Lungs no rales or rhonchi Heart regular rate and rhythm Abd soft, nontender, positive bowel sounds MSK no focal spinal tenderness, no upper extremity lymphedema Neuro: nonfocal, well oriented, appropriate affect   LAB RESULTS: Results for Eugene, [name] (MRN [id number]) as of 02/07/2016 20:03  Ref. Range 12/02/2014 10:59 12/10/2014 13:43 01/27/2015 11:53 07/28/2015 11:13 01/31/2016 11:41  Ferritin Latest Ref Range: 22 - 316 ng/ml 59 50 26 70 121   Lab Results  Component Value Date   WBC 6.7 01/31/2016   NEUTROABS 4.5 01/31/2016   HGB 14.9 01/31/2016   HCT 41.4 01/31/2016   MCV 92.2 01/31/2016   PLT 180 01/31/2016      Chemistry      Component Value Date/Time   NA 135 04/20/2015 0939   NA 139 10/15/2014 1304   K 3.7 04/20/2015 0939   K 3.8 10/15/2014 1304   CL 97 (L) 04/20/2015 0939   CO2 27 04/20/2015 0939   CO2 26 10/15/2014 1304   BUN 11 04/20/2015 0939   BUN 14.9 10/15/2014 1304   CREATININE 1.09 04/20/2015 0939   CREATININE 1.0 10/15/2014  1304      Component Value Date/Time   CALCIUM 10.0 04/20/2015 0939   CALCIUM 10.3 10/15/2014 1304   ALKPHOS 45 10/15/2014 1304   AST 19 10/15/2014 1304   ALT 17 10/15/2014 1304   BILITOT 1.45 (H) 10/15/2014 1304       No results found for: LABCA2  No components found for: [id number]  No results for input(s): INR in the last 168 hours.  Urinalysis    Component Value Date/Time   COLORURINE YELLOW 07/15/2007 1041   APPEARANCEUR CLEAR 07/15/2007 1041   LABSPEC 1.007 07/15/2007 1041   PHURINE 7.5 07/15/2007 1041   GLUCOSEU NEGATIVE 07/15/2007 1041   HGBUR TRACE (A) 07/15/2007 1041   BILIRUBINUR NEGATIVE 07/15/2007 1041   KETONESUR NEGATIVE 07/15/2007 1041   PROTEINUR NEGATIVE 07/15/2007 1041   UROBILINOGEN 0.2 07/15/2007 1041   NITRITE NEGATIVE 07/15/2007 1041   LEUKOCYTESUR NEGATIVE 07/15/2007 1041    STUDIES: No results found.  ASSESSMENT: 80 y.o. Eugene [name] with a history of hemochromatosis diagnosed in 1989, treated with intermittent phlebotomies.   (1) also history of abdominal aortic aneurysm, status post stent placement  (2) history of irritable bowel syndrome, intermittently symptomatic  (3) atrial fibrillation, on apixaban  (4) poor access, requiring PICC during phlebotomies  PLAN:  Eugene [name]'s ferritin is creeping up fairly steadily. He understands the very likely will need phlebotomy within the next few months. He will have lab work again in December, and if we are close to the trigger we will probably set him up for phlebotomies after the holidays.  He continues to work in his irritable bowel with Dr. Loreta AveMann. Hopefully they can get back to a more stable status.  Otherwise she will see me again in one year. He knows to call for any other problems that may develop before then.  Eugene [name]    02/07/2016

## 2016-02-07 NOTE — Telephone Encounter (Signed)
appt made avs printed °

## 2016-02-13 ENCOUNTER — Other Ambulatory Visit: Payer: Self-pay | Admitting: Cardiology

## 2016-02-18 ENCOUNTER — Other Ambulatory Visit: Payer: Self-pay | Admitting: Cardiology

## 2016-04-11 ENCOUNTER — Other Ambulatory Visit: Payer: Self-pay | Admitting: Cardiology

## 2016-04-24 ENCOUNTER — Other Ambulatory Visit: Payer: Self-pay | Admitting: Cardiology

## 2016-04-28 ENCOUNTER — Other Ambulatory Visit: Payer: Self-pay | Admitting: Cardiology

## 2016-05-07 ENCOUNTER — Other Ambulatory Visit: Payer: Self-pay

## 2016-05-08 ENCOUNTER — Other Ambulatory Visit (HOSPITAL_BASED_OUTPATIENT_CLINIC_OR_DEPARTMENT_OTHER): Payer: Medicare Other

## 2016-05-08 LAB — CBC WITH DIFFERENTIAL/PLATELET
BASO%: 0.8 % (ref 0.0–2.0)
BASOS ABS: 0.1 10*3/uL (ref 0.0–0.1)
EOS ABS: 0.2 10*3/uL (ref 0.0–0.5)
EOS%: 3 % (ref 0.0–7.0)
HCT: 43.6 % (ref 38.4–49.9)
HGB: 14.8 g/dL (ref 13.0–17.1)
LYMPH%: 19.7 % (ref 14.0–49.0)
MCH: 32.9 pg (ref 27.2–33.4)
MCHC: 34.1 g/dL (ref 32.0–36.0)
MCV: 96.6 fL (ref 79.3–98.0)
MONO#: 0.6 10*3/uL (ref 0.1–0.9)
MONO%: 7.3 % (ref 0.0–14.0)
NEUT#: 5.5 10*3/uL (ref 1.5–6.5)
NEUT%: 69.2 % (ref 39.0–75.0)
Platelets: 201 10*3/uL (ref 140–400)
RBC: 4.51 10*6/uL (ref 4.20–5.82)
RDW: 13.6 % (ref 11.0–14.6)
WBC: 7.9 10*3/uL (ref 4.0–10.3)
lymph#: 1.6 10*3/uL (ref 0.9–3.3)

## 2016-05-08 LAB — FERRITIN: FERRITIN: 160 ng/mL (ref 22–316)

## 2016-05-23 ENCOUNTER — Other Ambulatory Visit: Payer: Self-pay | Admitting: Cardiology

## 2016-05-23 NOTE — Telephone Encounter (Signed)
amLODipine (NORVASC) 10 MG tablet  Medication  Date: 04/24/2016 Department[facility] [address] Office Ordering/Authorizing: Quintella Reichertraci R [name], MD  Order Providers   Prescribing Provider Encounter Provider  Quintella Reichertraci R [name], MD Quintella Reichertraci R [name], MD  Medication Detail    Disp Refills Start End   amLODipine (NORVASC) 10 MG tablet 30 tablet 0 04/24/2016    Sig: TAKE 1 TABLET BY MOUTH EVERY DAY **PATIENT NEEDS TO SCHEDULE AN APPOINTMENT FOR FURTHER REFILLS**   E-Prescribing Status: Receipt confirmed by pharmacy (04/24/2016 4:05 PM EST)   Pharmacy   [id number] - [place],  - [address] [address] [facility]

## 2016-05-30 ENCOUNTER — Other Ambulatory Visit: Payer: Self-pay | Admitting: Cardiology

## 2016-06-14 ENCOUNTER — Ambulatory Visit: Payer: Medicare Other | Admitting: Cardiology

## 2016-06-26 ENCOUNTER — Other Ambulatory Visit: Payer: Self-pay | Admitting: Cardiology

## 2016-06-28 ENCOUNTER — Other Ambulatory Visit: Payer: Self-pay | Admitting: Cardiology

## 2016-06-28 ENCOUNTER — Other Ambulatory Visit: Payer: Self-pay | Admitting: *Deleted

## 2016-06-28 ENCOUNTER — Encounter: Payer: Self-pay | Admitting: *Deleted

## 2016-06-28 DIAGNOSIS — I482 Chronic atrial fibrillation, unspecified: Secondary | ICD-10-CM

## 2016-07-05 ENCOUNTER — Telehealth: Payer: Self-pay | Admitting: *Deleted

## 2016-07-05 NOTE — Telephone Encounter (Signed)
FYI "I have questions about my last visit and December lab results.  When am I scheduled to return?  What were the results?  (Results provided.  Ferritin = 160)  I guess I'm right at the line and we're waiting until September.  Thanks for answering my questions.  In September if I need phlebotomies, I'll need a port-a-cath."

## 2016-07-19 ENCOUNTER — Ambulatory Visit (INDEPENDENT_AMBULATORY_CARE_PROVIDER_SITE_OTHER): Payer: Medicare Other | Admitting: Cardiology

## 2016-07-19 ENCOUNTER — Encounter: Payer: Self-pay | Admitting: Cardiology

## 2016-07-19 ENCOUNTER — Encounter (INDEPENDENT_AMBULATORY_CARE_PROVIDER_SITE_OTHER): Payer: Self-pay

## 2016-07-19 VITALS — BP 132/78 | HR 77 | Ht 72.0 in | Wt 202.8 lb

## 2016-07-19 DIAGNOSIS — I4821 Permanent atrial fibrillation: Secondary | ICD-10-CM

## 2016-07-19 DIAGNOSIS — I10 Essential (primary) hypertension: Secondary | ICD-10-CM

## 2016-07-19 DIAGNOSIS — I482 Chronic atrial fibrillation: Secondary | ICD-10-CM

## 2016-07-19 NOTE — Patient Instructions (Signed)
Medication Instructions:  Your physician recommends that you continue on your current medications as directed. Please refer to the Current Medication list given to you today.   Labwork: TODAY: BMET  Testing/Procedures: None  Follow-Up: Your physician wants you to follow-up in: 1 year with Dr. [name]. You will receive a reminder letter in the mail two months in advance. If you don't receive a letter, please call our office to schedule the follow-up appointment.   Any Other Special Instructions Will Be Listed Below (If Applicable).     If you need a refill on your cardiac medications before your next appointment, please call your pharmacy.   

## 2016-07-19 NOTE — Progress Notes (Signed)
Cardiology Office Note    Date:  07/19/2016   ID:  Eugene CristalJames A Mcguinn, DOB 1935/06/20, MRN [id number]  PCP:  Georgann HousekeeperHUSAIN,KARRAR, MD  Cardiologist:  Armanda Magicraci [name], MD   Chief Complaint  Patient presents with  . Atrial Fibrillation  . Hypertension    History of Present Illness:  Eugene [name] is a 81 y.o. male with a history of HTN, permanent atrial fibrillation s/p failed DCCV x2 on chronic anticoagulation who presents today for followup. He is doing well. He denies any chest pain, SOB, DOE, LE edema, dizziness, palpitations or syncope. He walks usually everyday for exercise.   Past Medical History:  Diagnosis Date  . AAA (abdominal aortic aneurysm) (HCC) 06/29/2007   stent graft  . GERD (gastroesophageal reflux disease)   . Hemochromatosis   . Hyperlipidemia   . Hypertension   . Irritable bowel syndrome   . Permanent atrial fibrillation (HCC)   . Stroke [facility](HCC)    Transient Ischemic Attack    Past Surgical History:  Procedure Laterality Date  . ABDOMINAL AORTIC ANEURYSM REPAIR    . ENDOVASCULAR STENT INSERTION  07/18/2007   saccular infrarenal aortic aneurysm  . EYE SURGERY Bilateral    Cataract    . PICC LINE PLACE PERIPHERAL ([facility] HX)  Oct 21, 2014    Current Medications: No outpatient prescriptions have been marked as taking for the 07/19/16 encounter (Office Visit) with Quintella Reichertraci R [name], MD.    Allergies:   Chlorhexidine gluconate and Tape   Social History   Social History  . Marital status: Married    Spouse name: N/A  . Number of children: N/A  . Years of education: N/A   Social History Main Topics  . Smoking status: Former Smoker    Types: Cigarettes    Quit date: 06/18/1999  . Smokeless tobacco: Never Used  . Alcohol use 0.0 oz/week    1 - 2 Standard drinks or equivalent per week  . Drug use: No  . Sexual activity: Not Asked   Other Topics Concern  . None   Social History Narrative  . None     Family History:  The patient's family history  includes Heart disease in his father and mother; Hypertension in his mother.   ROS:   Please see the history of present illness.    ROS All other systems reviewed and are negative.  No flowsheet data found.     PHYSICAL EXAM:   VS:  BP 132/78   Pulse 77   Ht 6' (1.829 m)   Wt 202 lb 12.8 oz (92 kg)   BMI 27.50 kg/m    GEN: Well nourished, well developed, in no acute distress  HEENT: normal  Neck: no JVD, carotid bruits, or masses Cardiac: irregulary irregular; no murmurs, rubs, or gallops,no edema.  Intact distal pulses bilaterally.  Respiratory:  clear to auscultation bilaterally, normal work of breathing GI: soft, nontender, nondistended, + BS MS: no deformity or atrophy  Skin: warm and dry, no rash Neuro:  Alert and Oriented x 3, Strength and sensation are intact Psych: euthymic mood, full affect  Wt Readings from Last 3 Encounters:  07/19/16 202 lb 12.8 oz (92 kg)  02/07/16 199 lb 3.2 oz (90.4 kg)  11/01/15 198 lb (89.8 kg)      Studies/Labs Reviewed:   EKG:  EKG is ordered today.  The ekg ordered today demonstrates atrial fibrillation at 77bpm with LAD and IRBBB  Recent Labs: 05/08/2016: HGB 14.8; Platelets 201  Lipid Panel No results found for: CHOL, TRIG, HDL, CHOLHDL, VLDL, LDLCALC, LDLDIRECT  Additional studies/ records that were reviewed today include:  none    ASSESSMENT:    1. Permanent atrial fibrillation (HCC)   2. Benign essential HTN      PLAN:  In order of problems listed above:  1. Permanent atrial fibrillation rate controlled.  He will continue on BB and Eliquis.  Check BMET.  Recent CBC was normal.  2. HTN - BP controlled on current meds. He will continue on amlodipine/diuretic/BB and ARB.    Medication Adjustments/Labs and Tests Ordered: Current medicines are reviewed at length with the patient today.  Concerns regarding medicines are outlined above.  Medication changes, Labs and Tests ordered today are listed in the Patient  Instructions below.  There are no Patient Instructions on file for this visit.   Signed, Armanda Magic, MD  07/19/2016 1:53 PM    [facility] [address] [place], [place], [place]  [address] Phone: [phone]; Fax: [phone]

## 2016-07-20 ENCOUNTER — Telehealth: Payer: Self-pay

## 2016-07-20 DIAGNOSIS — I10 Essential (primary) hypertension: Secondary | ICD-10-CM

## 2016-07-20 LAB — BASIC METABOLIC PANEL
BUN/Creatinine Ratio: 10 (ref 10–24)
BUN: 11 mg/dL (ref 8–27)
CO2: 24 mmol/L (ref 18–29)
Calcium: 9.6 mg/dL (ref 8.6–10.2)
Chloride: 94 mmol/L — ABNORMAL LOW (ref 96–106)
Creatinine, Ser: 1.1 mg/dL (ref 0.76–1.27)
GFR calc Af Amer: 73 (ref >59)
GFR calc non Af Amer: 64 (ref >59)
Glucose: 82 mg/dL (ref 65–99)
Potassium: 3.4 mmol/L — ABNORMAL LOW (ref 3.5–5.2)
Sodium: 137 mmol/L (ref 134–144)

## 2016-07-20 MED ORDER — POTASSIUM CHLORIDE ER 20 MEQ PO TBCR: 20.0000 meq | EXTENDED_RELEASE_TABLET | Freq: Every day | ORAL | 11 refills | Status: DC

## 2016-07-20 MED ORDER — POTASSIUM CHLORIDE CRYS ER 20 MEQ PO TBCR: 20.0000 meq | EXTENDED_RELEASE_TABLET | Freq: Every day | ORAL | 3 refills | Status: DC

## 2016-07-20 NOTE — Telephone Encounter (Signed)
-----   Message from Quintella Reichertraci R [name], MD sent at 07/20/2016  9:03 AM EST ----- Increase Kdur to 20meq daily and repeat BMET in 1 week

## 2016-07-20 NOTE — Telephone Encounter (Signed)
Informed patient of results and verbal understanding expressed.  Instructed patient to INCREASE KDUR to 20 meq daily. BMET scheduled 3/2. Patient agrees with treatment plan.

## 2016-07-20 NOTE — Telephone Encounter (Signed)
[facility] called to clarify potassium order.  She states the patient is currently taking Klor-con and asked if he could increase that. Informed her the patient was instructed to double his medication dosage and that that would be fine. New rx called in.

## 2016-07-20 NOTE — Addendum Note (Signed)
Addended by: Madalyn RobOX, Shaye Lagace A on: 07/20/2016 02:21 PM   Modules accepted: Orders

## 2016-07-23 ENCOUNTER — Other Ambulatory Visit: Payer: Self-pay | Admitting: Cardiology

## 2016-07-27 ENCOUNTER — Other Ambulatory Visit: Payer: Medicare Other | Admitting: *Deleted

## 2016-07-27 DIAGNOSIS — I10 Essential (primary) hypertension: Secondary | ICD-10-CM

## 2016-07-27 LAB — BASIC METABOLIC PANEL
BUN/Creatinine Ratio: 10 (ref 10–24)
BUN: 10 mg/dL (ref 8–27)
CALCIUM: 9.7 mg/dL (ref 8.6–10.2)
CO2: 25 mmol/L (ref 18–29)
CREATININE: 0.98 mg/dL (ref 0.76–1.27)
Chloride: 94 mmol/L — ABNORMAL LOW (ref 96–106)
GFR, EST AFRICAN AMERICAN: 84 mL/min/{1.73_m2} (ref >59)
GFR, EST NON AFRICAN AMERICAN: 73 mL/min/{1.73_m2} (ref >59)
Glucose: 88 mg/dL (ref 65–99)
POTASSIUM: 3.6 mmol/L (ref 3.5–5.2)
Sodium: 136 mmol/L (ref 134–144)

## 2016-08-02 ENCOUNTER — Other Ambulatory Visit: Payer: Self-pay | Admitting: Internal Medicine

## 2016-08-02 ENCOUNTER — Ambulatory Visit
Admission: RE | Admit: 2016-08-02 | Discharge: 2016-08-02 | Disposition: A | Discharge status: Home / Self Care | Payer: Medicare Other | Source: Ambulatory Visit | Attending: Internal Medicine | Admitting: Internal Medicine

## 2016-08-02 DIAGNOSIS — R109 Unspecified abdominal pain: Secondary | ICD-10-CM

## 2016-09-10 ENCOUNTER — Other Ambulatory Visit: Payer: Self-pay | Admitting: Cardiology

## 2016-10-11 ENCOUNTER — Telehealth: Payer: Self-pay | Admitting: Pharmacist

## 2016-10-11 NOTE — Telephone Encounter (Signed)
Anticoag clearance for colonoscopy. Pt on Eliquis for Afib with hx of stroke. Per protocol hold 24 hours prior to procedure and resume ASAP after given history of stroke.

## 2016-10-12 ENCOUNTER — Telehealth: Payer: Self-pay | Admitting: Cardiology

## 2016-10-12 NOTE — Telephone Encounter (Signed)
New Message     Pt is schedule for colonoscopy for 5/23, they need cardiac clearance for him for anesthesia , they received your note for the eliquis

## 2016-10-14 NOTE — Telephone Encounter (Signed)
He is stable from a cardiac standpoint for anesthesia for colonoscopy

## 2016-10-15 NOTE — Telephone Encounter (Signed)
Confirmed with patient that clearance is to be faxed to Dr. Kenna GilbertMann's [facility] at [facility].  Confirmed with receptionist fax number: [phone].  Faxed.

## 2016-10-31 ENCOUNTER — Encounter: Payer: Self-pay | Admitting: Family

## 2016-11-05 ENCOUNTER — Other Ambulatory Visit: Payer: Self-pay

## 2016-11-05 DIAGNOSIS — I714 Abdominal aortic aneurysm, without rupture, unspecified: Secondary | ICD-10-CM

## 2016-11-06 ENCOUNTER — Encounter: Payer: Self-pay | Admitting: Family

## 2016-11-06 ENCOUNTER — Ambulatory Visit (HOSPITAL_COMMUNITY)
Admission: RE | Admit: 2016-11-06 | Discharge: 2016-11-06 | Disposition: A | Discharge status: Home / Self Care | Payer: Medicare Other | Source: Ambulatory Visit | Attending: Family | Admitting: Family

## 2016-11-06 ENCOUNTER — Ambulatory Visit (INDEPENDENT_AMBULATORY_CARE_PROVIDER_SITE_OTHER): Payer: Medicare Other | Admitting: Family

## 2016-11-06 VITALS — BP 113/72 | HR 74 | Temp 97.4°F | Resp 18 | Ht 73.0 in | Wt 200.0 lb

## 2016-11-06 DIAGNOSIS — Z87891 Personal history of nicotine dependence: Secondary | ICD-10-CM

## 2016-11-06 DIAGNOSIS — I714 Abdominal aortic aneurysm, without rupture, unspecified: Secondary | ICD-10-CM

## 2016-11-06 DIAGNOSIS — Z95828 Presence of other vascular implants and grafts: Secondary | ICD-10-CM | POA: Diagnosis not present

## 2016-11-06 NOTE — Patient Instructions (Signed)
Before your next abdominal ultrasound:  Take two Extra-Strength Gas-X capsules at bedtime the night before the test. Take another two Extra-Strength Gas-X capsules 3 hours before the test.  Avoid gas forming foods the day before the test.       

## 2016-11-06 NOTE — Progress Notes (Signed)
[facility] [place]  CC: Follow up s/p Endovascular Repair of Abdominal Aortic Aneurysm    History of Present Illness  Eugene [name] is a 81 y.o. (09/06/1935) male male patient of Dr. Myra Gianotti. He is a former patient of Dr. Madilyn Fireman. He is status post endovascular aneurysm repair on 06/29/2007 using an Endologix device. He is back today for followup.  The patient denies back or abdominal pain. Pt states he is physically active. 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 He denies claudication symptoms with walking.   He has hemochromatosis, diagnosed in the 1990's, states he will probably need to have a port placed to draw off blood.   He has IBS, states this is the likely cause of his gas.  He had a stroke in 2001 as manifested by right eye limited movement; he then had several TIA's afterward, his last TIA was about January 2015.  Pt states his cardiologist is checking his carotid arteries by ultrasound.   Pt Diabetic: No Pt smoker: former smoker, quit in 2001 He takes Eliquis for atrial fib, Dr. Mayford Knife is his cardiologist.    Past Medical History:  Diagnosis Date  . AAA (abdominal aortic aneurysm) (HCC) 06/29/2007   stent graft  . GERD (gastroesophageal reflux disease)   . Hemochromatosis   . Hyperlipidemia   . Hypertension   . Irritable bowel syndrome   . Permanent atrial fibrillation (HCC)   . Stroke [facility])    Transient Ischemic Attack   Past Surgical History:  Procedure Laterality Date  . ABDOMINAL AORTIC ANEURYSM REPAIR    . ENDOVASCULAR STENT INSERTION  07/18/2007   saccular infrarenal aortic aneurysm  . EYE SURGERY Bilateral    Cataract    . PICC LINE PLACE PERIPHERAL [facility] HX)  Oct 21, 2014   Social History Social History  Substance Use Topics  . Smoking status: Former Smoker    Types: Cigarettes    Quit date: 06/18/1999  .  Smokeless tobacco: Never Used  . Alcohol use 0.0 oz/week    1 - 2 Standard drinks or equivalent per week   Family History Family History  Problem Relation Age of Onset  . Hypertension Mother   . Heart disease Mother        AAA, he passed in his 8 s of Heart Diseasse  . Heart disease Father        AAA history and he passed at age 12  of   Disease   Current Outpatient Prescriptions on File Prior to Visit  Medication Sig Dispense Refill  . ALPRAZolam (XANAX) 0.5 MG tablet Take 0.5 mg by mouth as needed. For anxiety    . amLODipine (NORVASC) 10 MG tablet Take 1 tablet (10 mg total) by mouth daily. 90 tablet 3  . Ascorbic Acid (VITAMIN C) 100 MG tablet Take 100 mg by mouth.     Marland Kitchen atorvastatin (LIPITOR) 20 MG tablet daily.    Marland Kitchen ELIQUIS 5 MG TABS tablet TAKE 1 TABLET BY MOUTH TWICE A DAY 60 tablet 11  . hydrochlorothiazide (HYDRODIURIL) 25 MG tablet Take 25 mg by mouth daily.    . hyoscyamine (LEVBID) 0.375 MG 12 hr tablet Take 0.375 mg by mouth as needed.     Marland Kitchen LORazepam (ATIVAN) 0.5 MG tablet Take 0.5 mg by mouth at bedtime.     . metoprolol tartrate (LOPRESSOR) 25 MG tablet Take  25 mg by mouth 2 (two) times daily.     . potassium chloride SA (K-DUR,KLOR-CON) 20 MEQ tablet Take 1 tablet (20 mEq total) by mouth daily. 90 tablet 3  . ramipril (ALTACE) 5 MG capsule daily.    Marland Kitchen thiamine (VITAMIN B-1) 100 MG tablet Take 250 mg by mouth daily.    . vitamin E 100 UNIT capsule Take 100 Units by mouth daily.     Current Facility-Administered Medications on File Prior to Visit  Medication Dose Route Frequency Provider Last Rate Last Dose  . sodium chloride 0.9 % injection 10 mL  10 mL Intravenous PRN Magrinat, Valentino Hue, MD   10 mL at 11/25/14 1220   Allergies  Allergen Reactions  . Chlorhexidine Gluconate Itching    Don't use Chloraprep inside port a cath kits.  . Tape     Adhesive tape,   Only use paper tape     ROS: See HPI for pertinent positives and negatives.  Physical  Examination  Vitals:   11/06/16 0849  BP: 113/72  Pulse: 74  Resp: 18  Temp: 97.4 F (36.3 C)  SpO2: 98%  Weight: 200 lb (90.7 kg)  Height: 6\' 1"  (1.854 m)   Body mass index is 26.39 kg/m.  General: A&O x 3, WD male.  Pulmonary: Sym exp, respirations are non labored, good air movt, CTAB, no rales, rhonchi, or wheezing.   Cardiac: Regular rhythm and rrate, no murmur detected.  Vascular: Vessel Right Left  Radial 2+Palpable 2+Palpable  Carotid without bruit without bruit  Femoral 1+ palpable  2+Palpable   Aorta Not palpable N/A  Popliteal Not palpable Not palpable  PT 2+Palpable 2+ Palpable  DP 2+Palpable 2+Palpable   Gastrointestinal: soft, NTND, -G/R, - HSM, - palpable masses, - CVAT B   Musculoskeletal: M/S 5/5 throughout, Extremities without ischemic changes  Neurologic: Pain and light touch intact in extremities, CN 2-12 intact, Motor exam as listed above     Non-Invasive Vascular Imaging  EVAR Duplex (Date: 11-06-16)  AAA sac size: 2.98 cm x 3.18 cm; Right CIA: 0.97 cm; Left CIA: 1.28 cm  Previous AAA sac size: 2.6 cm (11-01-15)  no endoleak detected   Most recent CTA (Date: 08-28-07) demonstrates:Endograft stent in place infrarenally within the  abdominal aorta. The aneurysm sac size currently measures 3.8 cm  compared to of 4.2 cm maximally previously. No evidence of  endoleak. Renal arteries are widely patent. IMA is occluded, with  branch filling collaterally via SMA collaterals. The stent graft ends in the common iliac arteries  bilaterally which are heavily calcified and tortuous. No evidence  of aneurysm or significant stenosis. Heavily calcified internal  iliac arteries noted, patent. Mild disease within the common  femoral arteries bilaterally.   Medical Decision Making  Eugene [name] is a 81 y.o. male who presents s/p EVAR (Date: 06/29/2007).  Pt is asymptomatic with slight  increase sac size to 3.18 cm today, from 2.6 cm a year ago. I discussed with Dr. Arbie Cookey the slight increase in sac size with no evidence of endo leak.  I discussed with the patient the importance of surveillance of the endograft.  The next endograft duplex will be scheduled for 12 months.  The patient will follow up with [place] in 12 months with these studies, see Dr. Myra Gianotti afterward.  I emphasized the importance of maximal medical management including strict control of blood pressure, blood glucose, and lipid levels, antiplatelet agents, obtaining regular exercise, and cessation of smoking.   Thank  you for allowing us to participate in this patient's care.  Charisse MarchSuzanne Nickel, RN, MSN, FNP-C [facility] [place] Office: [phone]  Clinic Physician: Early  11/06/2016, 9:10 AM

## 2016-11-16 NOTE — Addendum Note (Signed)
Addended by: Burton ApleyPETTY, Caren Garske A on: 11/16/2016 02:38 PM   Modules accepted: Orders

## 2017-01-29 ENCOUNTER — Other Ambulatory Visit: Payer: Self-pay

## 2017-01-30 ENCOUNTER — Other Ambulatory Visit (HOSPITAL_BASED_OUTPATIENT_CLINIC_OR_DEPARTMENT_OTHER): Payer: Medicare Other

## 2017-01-30 LAB — CBC WITH DIFFERENTIAL/PLATELET
BASO%: 0.6 % (ref 0.0–2.0)
BASOS ABS: 0 10*3/uL (ref 0.0–0.1)
EOS ABS: 0.2 10*3/uL (ref 0.0–0.5)
EOS%: 2.7 % (ref 0.0–7.0)
HEMATOCRIT: 42.8 % (ref 38.4–49.9)
HEMOGLOBIN: 15.1 g/dL (ref 13.0–17.1)
LYMPH#: 1.5 10*3/uL (ref 0.9–3.3)
LYMPH%: 22.4 % (ref 14.0–49.0)
MCH: 34 pg — AB (ref 27.2–33.4)
MCHC: 35.3 g/dL (ref 32.0–36.0)
MCV: 96.6 fL (ref 79.3–98.0)
MONO#: 0.6 10*3/uL (ref 0.1–0.9)
MONO%: 8.4 % (ref 0.0–14.0)
NEUT%: 65.9 % (ref 39.0–75.0)
NEUTROS ABS: 4.3 10*3/uL (ref 1.5–6.5)
PLATELETS: 185 10*3/uL (ref 140–400)
RBC: 4.43 10*6/uL (ref 4.20–5.82)
RDW: 13.7 % (ref 11.0–14.6)
WBC: 6.6 10*3/uL (ref 4.0–10.3)

## 2017-01-30 LAB — COMPREHENSIVE METABOLIC PANEL
ALBUMIN: 4.1 g/dL (ref 3.5–5.0)
ALK PHOS: 57 U/L (ref 40–150)
ALT: 22 U/L (ref 0–55)
AST: 22 U/L (ref 5–34)
Anion Gap: 11 mEq/L (ref 3–11)
BILIRUBIN TOTAL: 1.59 mg/dL — AB (ref 0.20–1.20)
BUN: 11.5 mg/dL (ref 7.0–26.0)
CALCIUM: 10.4 mg/dL (ref 8.4–10.4)
CO2: 27 mEq/L (ref 22–29)
Chloride: 97 mEq/L — ABNORMAL LOW (ref 98–109)
Creatinine: 1.1 mg/dL (ref 0.7–1.3)
EGFR: 65 mL/min/{1.73_m2} — AB (ref >90)
GLUCOSE: 87 mg/dL (ref 70–140)
Potassium: 3.5 mEq/L (ref 3.5–5.1)
SODIUM: 135 meq/L — AB (ref 136–145)
TOTAL PROTEIN: 6.9 g/dL (ref 6.4–8.3)

## 2017-01-30 LAB — FERRITIN: FERRITIN: 256 ng/mL (ref 22–316)

## 2017-02-06 ENCOUNTER — Other Ambulatory Visit: Payer: Self-pay

## 2017-02-06 ENCOUNTER — Encounter (INDEPENDENT_AMBULATORY_CARE_PROVIDER_SITE_OTHER): Payer: Self-pay

## 2017-02-06 ENCOUNTER — Ambulatory Visit (HOSPITAL_BASED_OUTPATIENT_CLINIC_OR_DEPARTMENT_OTHER): Payer: Medicare Other | Admitting: Oncology

## 2017-02-06 DIAGNOSIS — I4891 Unspecified atrial fibrillation: Secondary | ICD-10-CM | POA: Diagnosis not present

## 2017-02-06 DIAGNOSIS — Z7901 Long term (current) use of anticoagulants: Secondary | ICD-10-CM

## 2017-02-06 MED FILL — NORMAL SALINE FLUSH SYRINGE: 0.9 | 112 days supply | Qty: 160 | Fill #0

## 2017-02-06 MED FILL — HEPARIN 500 UNIT/5 ML (100/: 100 | 112 days supply | Qty: 80 | Fill #0

## 2017-02-06 NOTE — Progress Notes (Signed)
ID: Evette CristalJames A Zwiefelhofer   DOB: February 18, 1936  MR#: [id number]  WUJ#:[id number]#:[id number]  PCP: Georgann HousekeeperHusain, Karrar, MD SU:  OTHER MD: Hillery AldoJN Mann, Delilah Shanraci [name], Vance "Wells" Brabham     INTERVAL HISTORY: Rosanne AshingJim returns today for follow-up and treatment of his hemochromatosis. He has been off treatment now for almost 2 years. His ferritin has risen steadily and it is now beyond the "trigger point" from phlebotomy.  REVIEW OF SYSTEMS: Rosanne AshingJim continues to do very well for his age. He does have some chronic issues and what bothers him the most is the "acute irritable bowel" problems. He is doing traveling, lots of family events, including his second grand son getting married in the near future. Also his wife is going to be turning 70 in November of this year and he is planning a big celebration for that aside from these issues a detailed review of systems today was stable  HISTORY OF PRESENT ILLNESS: [name]'s hemochromatosis was documented by labs and genetics in 1989. His family (sister, children) have been tested and all are negative for the mutation  PAST MEDICAL HISTORY: Past Medical History:  Diagnosis Date  . AAA (abdominal aortic aneurysm) (HCC) 06/29/2007   stent graft  . GERD (gastroesophageal reflux disease)   . Hemochromatosis   . Hyperlipidemia   . Hypertension   . Irritable bowel syndrome   . Permanent atrial fibrillation (HCC)   . Stroke (HCC)    Transient Ischemic Attack    PAST SURGICAL HISTORY: Past Surgical History:  Procedure Laterality Date  . ABDOMINAL AORTIC ANEURYSM REPAIR    . ENDOVASCULAR STENT INSERTION  07/18/2007   saccular infrarenal aortic aneurysm  . EYE SURGERY Bilateral    Cataract    . PICC LINE PLACE PERIPHERAL ([facility] HX)  Oct 21, 2014    FAMILY HISTORY Family History  Problem Relation Age of Onset  . Hypertension Mother   . Heart disease Mother        AAA, he passed in his 4080 s of Heart Diseasse  . Heart disease Father        AAA history and he passed at age 81  of    Disease   the patient's father died from a myocardial infarction at age 79. The patient's mother died from lung problems at age 79. He had no brothers, one sister. There is no history of hemochromatosis in the family to his knowledge.  SOCIAL HISTORY: Rosanne AshingJim has 2 children from his first marriage, Aneta MinsStephanie McDaniel, who is a Runner, broadcasting/film/videoteacher in [place], with one adopted child, and Carolan ShiverJamie Louthan, who works in Engineer, materialsair conditioning repair, and has one biologic and one adopted child. [name]'s second wife, Lynnell DikeSusie, who was my patient, had 2 children from her prior marriage, and [name]'s current wife, Meriam SpragueBeverly, also has 2 children of her own. Rosanne AshingJim and [name] married in 2007. The patient attends a [employer].   ADVANCED DIRECTIVES: not in place  HEALTH MAINTENANCE: Social History  Substance Use Topics  . Smoking status: Former Smoker    Types: Cigarettes    Quit date: 06/18/1999  . Smokeless tobacco: Never Used  . Alcohol use 0.0 oz/week    1 - 2 Standard drinks or equivalent per week    Allergies  Allergen Reactions  . Chlorhexidine Gluconate Itching    Don't use Chloraprep inside port a cath kits.  . Tape     Adhesive tape,   Only use paper tape    Current Outpatient Prescriptions  Medication Sig Dispense Refill  . ALPRAZolam (  XANAX) 0.5 MG tablet Take 0.5 mg by mouth as needed. For anxiety    . amLODipine (NORVASC) 10 MG tablet Take 1 tablet (10 mg total) by mouth daily. 90 tablet 3  . Ascorbic Acid (VITAMIN C) 100 MG tablet Take 100 mg by mouth.     Marland Kitchen atorvastatin (LIPITOR) 20 MG tablet daily.    Marland Kitchen ELIQUIS 5 MG TABS tablet TAKE 1 TABLET BY MOUTH TWICE A DAY 60 tablet 11  . GAVILYTE-G 236 g solution See admin instructions.  0  . hydrochlorothiazide (HYDRODIURIL) 25 MG tablet Take 25 mg by mouth daily.    . hyoscyamine (LEVBID) 0.375 MG 12 hr tablet Take 0.375 mg by mouth as needed.     Marland Kitchen LORazepam (ATIVAN) 0.5 MG tablet Take 0.5 mg by mouth at bedtime.     . metoprolol tartrate (LOPRESSOR) 25 MG  tablet Take 25 mg by mouth 2 (two) times daily.     . potassium chloride SA (K-DUR,KLOR-CON) 20 MEQ tablet Take 1 tablet (20 mEq total) by mouth daily. 90 tablet 3  . ramipril (ALTACE) 5 MG capsule daily.    Marland Kitchen thiamine (VITAMIN B-1) 100 MG tablet Take 250 mg by mouth daily.    . vitamin E 100 UNIT capsule Take 100 Units by mouth daily.     No current facility-administered medications for this visit.    Facility-Administered Medications Ordered in Other Visits  Medication Dose Route Frequency Provider Last Rate Last Dose  . sodium chloride 0.9 % injection 10 mL  10 mL Intravenous PRN Magrinat, Valentino Hue, MD   10 mL at 11/25/14 1220    OBJECTIVE: Middle-aged white man in no acute distress Vitals:   02/06/17 1109  BP: 124/72  Pulse: 76  Resp: 17  Temp: 97.6 F (36.4 C)  SpO2: 99%     Body mass index is 26.7 kg/m.    ECOG FS: 1  Sclerae unicteric, pupils round and equal Oropharynx clear and moist No cervical or supraclavicular adenopathy Lungs no rales or rhonchi Heart regular rate and rhythm Abd soft, nontender, positive bowel sounds MSK no focal spinal tenderness, no upper extremity lymphedema Neuro: nonfocal, well oriented, appropriate affect     LAB RESULTS: Most recent ferritin was 252.  Lab Results  Component Value Date   WBC 6.6 01/30/2017   NEUTROABS 4.3 01/30/2017   HGB 15.1 01/30/2017   HCT 42.8 01/30/2017   MCV 96.6 01/30/2017   PLT 185 01/30/2017      Chemistry      Component Value Date/Time   NA 135 (L) 01/30/2017 1145   K 3.5 01/30/2017 1145   CL 94 (L) 07/27/2016 1040   CO2 27 01/30/2017 1145   BUN 11.5 01/30/2017 1145   CREATININE 1.1 01/30/2017 1145      Component Value Date/Time   CALCIUM 10.4 01/30/2017 1145   ALKPHOS 57 01/30/2017 1145   AST 22 01/30/2017 1145   ALT 22 01/30/2017 1145   BILITOT 1.59 (H) 01/30/2017 1145       No results found for: LABCA2  No components found for: LABCA125  No results for input(s): INR in the last  168 hours.  Urinalysis    Component Value Date/Time   COLORURINE YELLOW 07/15/2007 1041   APPEARANCEUR CLEAR 07/15/2007 1041   LABSPEC 1.007 07/15/2007 1041   PHURINE 7.5 07/15/2007 1041   GLUCOSEU NEGATIVE 07/15/2007 1041   HGBUR TRACE (A) 07/15/2007 1041   BILIRUBINUR NEGATIVE 07/15/2007 1041   KETONESUR NEGATIVE 07/15/2007 1041  PROTEINUR NEGATIVE 07/15/2007 1041   UROBILINOGEN 0.2 07/15/2007 1041   NITRITE NEGATIVE 07/15/2007 1041   LEUKOCYTESUR NEGATIVE 07/15/2007 1041    STUDIES: No results found.  ASSESSMENT: 81 y.o. [place] man with a history of hemochromatosis diagnosed in 1989, treated with intermittent phlebotomies.   (1) also history of abdominal aortic aneurysm, status post stent placement  (2) history of irritable bowel syndrome, intermittently symptomatic  (3) atrial fibrillation, on apixaban  (4) poor access, requiring PICC during phlebotomies  PLAN:  [name]'s Ferritin is above 250. I generally like to start phlebotomies at 150 and drop until it is less than 50. Last time we started at 200 and it took 8 phlebotomies to get him under 50.  A second issue with him his very poor access.  Accordingly I have set him up for PICC placement by IR 02/12/2017 and then he will start weekly phlebotomies at the same time. In the past he has not needed intravenous fluids. We have been able to do weekly phlebotomies without difficulty and his wife knows how to flush the PICC line. We are calling in the flushes for her.  Accordingly the plan is to start phlebotomies September 18, continue with weekly labs until the ferritin is less than 50.  He is going to repeat lab work here once that is done in the March and then again in September of next year. He will see me a week after the September 2019 labs  [name] has a good understanding of all this. He agrees with the plan. He knows to call for any problems that may develop before the next visit here.  MAGRINAT,GUSTAV C     02/06/2017

## 2017-02-06 NOTE — Telephone Encounter (Signed)
Heparin and saline flush x 16 doses [facility] in [facility] per DR Magrinat

## 2017-02-07 ENCOUNTER — Other Ambulatory Visit: Payer: Self-pay | Admitting: Oncology

## 2017-02-07 NOTE — Progress Notes (Signed)
Received a note from Dr. Mayford Knifeurner requesting a neck on this patient. The order was entered.

## 2017-02-11 ENCOUNTER — Other Ambulatory Visit: Payer: Self-pay

## 2017-02-11 ENCOUNTER — Ambulatory Visit (HOSPITAL_COMMUNITY): Payer: Medicare Other | Attending: Cardiology

## 2017-02-11 DIAGNOSIS — I082 Rheumatic disorders of both aortic and tricuspid valves: Secondary | ICD-10-CM | POA: Diagnosis not present

## 2017-02-11 DIAGNOSIS — I10 Essential (primary) hypertension: Secondary | ICD-10-CM | POA: Insufficient documentation

## 2017-02-11 DIAGNOSIS — Z8673 Personal history of transient ischemic attack (TIA), and cerebral infarction without residual deficits: Secondary | ICD-10-CM | POA: Insufficient documentation

## 2017-02-11 DIAGNOSIS — E785 Hyperlipidemia, unspecified: Secondary | ICD-10-CM | POA: Insufficient documentation

## 2017-02-11 DIAGNOSIS — I4891 Unspecified atrial fibrillation: Secondary | ICD-10-CM | POA: Insufficient documentation

## 2017-02-13 ENCOUNTER — Ambulatory Visit (HOSPITAL_COMMUNITY)
Admission: RE | Admit: 2017-02-13 | Discharge: 2017-02-13 | Disposition: A | Discharge status: Home / Self Care | Payer: Medicare Other | Source: Ambulatory Visit | Attending: Oncology | Admitting: Oncology

## 2017-02-13 ENCOUNTER — Other Ambulatory Visit: Payer: Self-pay

## 2017-02-13 ENCOUNTER — Other Ambulatory Visit: Payer: Self-pay | Admitting: Oncology

## 2017-02-13 ENCOUNTER — Ambulatory Visit (HOSPITAL_BASED_OUTPATIENT_CLINIC_OR_DEPARTMENT_OTHER): Payer: Medicare Other

## 2017-02-13 ENCOUNTER — Encounter (HOSPITAL_COMMUNITY): Payer: Self-pay | Admitting: Interventional Radiology

## 2017-02-13 DIAGNOSIS — Z452 Encounter for adjustment and management of vascular access device: Secondary | ICD-10-CM

## 2017-02-13 HISTORY — PX: IR US GUIDE VASC ACCESS RIGHT: IMG2390

## 2017-02-13 HISTORY — PX: IR FLUORO GUIDE CV LINE RIGHT: IMG2283

## 2017-02-13 MED ORDER — SODIUM CHLORIDE 0.9% FLUSH
10.0000 mL | INTRAVENOUS | Status: AC | PRN
  Administered 2017-02-13: 10 mL
  Filled 2017-02-13: qty 10

## 2017-02-13 MED ORDER — LIDOCAINE HCL (PF) 2 % IJ SOLN
INTRAMUSCULAR | Status: DC | PRN
  Administered 2017-02-13: 5 mL via INTRADERMAL

## 2017-02-13 MED ORDER — ALTEPLASE 2 MG IJ SOLR
2.0000 mg | Freq: Once | INTRAMUSCULAR | Status: AC | PRN
  Filled 2017-02-13: qty 2

## 2017-02-13 MED ORDER — LIDOCAINE HCL (PF) 2 % IJ SOLN
INTRAMUSCULAR | Status: AC
  Filled 2017-02-13: qty 10

## 2017-02-13 MED ORDER — HEPARIN SOD (PORK) LOCK FLUSH 100 UNIT/ML IV SOLN
250.0000 [IU] | INTRAVENOUS | Status: AC | PRN
  Administered 2017-02-13: 250 [IU]
  Filled 2017-02-13: qty 5

## 2017-02-13 NOTE — Progress Notes (Signed)
Eugene [name] presents today for phlebotomy per MD orders. Phlebotomy procedure started at 1330 and ended at 1355 with 500 grams removed via PICC Line.  Patient observed for 15 minutes after procedure without any incident- declined to stay for 30 mins.  Patient tolerated procedure well. Diet and nutrition offered.

## 2017-02-13 NOTE — Patient Instructions (Signed)
Therapeutic Phlebotomy Therapeutic phlebotomy is the controlled removal of blood from a person's body for the purpose of treating a medical condition. The procedure is similar to donating blood. Usually, about a pint (470 mL, or 0.47L) of blood is removed. The average adult has 9-12 pints (4.3-5.7 L) of blood. Therapeutic phlebotomy may be used to treat the following medical conditions:  Hemochromatosis. This is a condition in which the blood contains too much iron.  Polycythemia vera. This is a condition in which the blood contains too many red blood cells.  Porphyria cutanea tarda. This is a disease in which an important part of hemoglobin is not made properly. It results in the buildup of abnormal amounts of porphyrins in the body.  Sickle cell disease. This is a condition in which the red blood cells form an abnormal crescent shape rather than a round shape.  Tell a health care provider about:  Any allergies you have.  All medicines you are taking, including vitamins, herbs, eye drops, creams, and over-the-counter medicines.  Any problems you or family members have had with anesthetic medicines.  Any blood disorders you have.  Any surgeries you have had.  Any medical conditions you have. What are the risks? Generally, this is a safe procedure. However, problems may occur, including:  Nausea or light-headedness.  Low blood pressure.  Soreness, bleeding, swelling, or bruising at the needle insertion site.  Infection.  What happens before the procedure?  Follow instructions from your health care provider about eating or drinking restrictions.  Ask your health care provider about changing or stopping your regular medicines. This is especially important if you are taking diabetes medicines or blood thinners.  Wear clothing with sleeves that can be raised above the elbow.  Plan to have someone take you home after the procedure.  You may have a blood sample taken. What  happens during the procedure?  A needle will be inserted into one of your veins.  Tubing and a collection bag will be attached to that needle.  Blood will flow through the needle and tubing into the collection bag.  You may be asked to open and close your hand slowly and continually during the entire collection.  After the specified amount of blood has been removed from your body, the collection bag and tubing will be clamped.  The needle will be removed from your vein.  Pressure will be held on the site of the needle insertion to stop the bleeding.  A bandage (dressing) will be placed over the needle insertion site. The procedure may vary among health care providers and hospitals. What happens after the procedure?  Your recovery will be assessed and monitored.  You can return to your normal activities as directed by your health care provider. This information is not intended to replace advice given to you by your health care provider. Make sure you discuss any questions you have with your health care provider. Document Released: 10/16/2010 Document Revised: 01/14/2016 Document Reviewed: 05/10/2014 Elsevier Interactive Patient Education  2018 Elsevier Inc.  

## 2017-02-13 NOTE — Procedures (Signed)
  Procedure:   R arm PICC 44cm Preprocedure diagnosis:  hemochromatosis Postprocedure diagnosis:  same EBL:     minimal Complications:   none immediate  See full dictation in [employer].  Thora Lance MD Main # [phone] Pager  [phone]

## 2017-02-15 ENCOUNTER — Ambulatory Visit (HOSPITAL_BASED_OUTPATIENT_CLINIC_OR_DEPARTMENT_OTHER): Payer: Medicare Other

## 2017-02-15 ENCOUNTER — Telehealth: Payer: Self-pay | Admitting: *Deleted

## 2017-02-15 DIAGNOSIS — Z452 Encounter for adjustment and management of vascular access device: Secondary | ICD-10-CM | POA: Diagnosis present

## 2017-02-15 NOTE — Progress Notes (Signed)
Patient reported to the flush room with complaints his PICC dressing was "pulling the tubing out". Dressing observed to have a significant bloody drainage, both bright and dark blood. Area cleaned using sterile technique and new dressing applied. Patient reported no pain and "pulling out sensation is gone". Patient would prefer opsite dressing to PICC instead of Sorbaview. Chart updated to show this. Patient instructed to call the office with any further concerns. Appointments confirmed for Tuesday.

## 2017-02-19 ENCOUNTER — Ambulatory Visit (HOSPITAL_COMMUNITY)
Admission: RE | Admit: 2017-02-19 | Discharge: 2017-02-19 | Disposition: A | Discharge status: Home / Self Care | Payer: Medicare Other | Source: Ambulatory Visit | Attending: Oncology | Admitting: Oncology

## 2017-02-19 ENCOUNTER — Ambulatory Visit (HOSPITAL_BASED_OUTPATIENT_CLINIC_OR_DEPARTMENT_OTHER): Payer: Medicare Other

## 2017-02-19 ENCOUNTER — Telehealth: Payer: Self-pay | Admitting: *Deleted

## 2017-02-19 ENCOUNTER — Other Ambulatory Visit (HOSPITAL_BASED_OUTPATIENT_CLINIC_OR_DEPARTMENT_OTHER): Payer: Medicare Other

## 2017-02-19 ENCOUNTER — Ambulatory Visit: Payer: Medicare Other

## 2017-02-19 DIAGNOSIS — J449 Chronic obstructive pulmonary disease, unspecified: Secondary | ICD-10-CM | POA: Diagnosis not present

## 2017-02-19 DIAGNOSIS — Z452 Encounter for adjustment and management of vascular access device: Secondary | ICD-10-CM

## 2017-02-19 DIAGNOSIS — I700 Atherosclerosis of aorta: Secondary | ICD-10-CM | POA: Diagnosis not present

## 2017-02-19 LAB — CBC WITH DIFFERENTIAL/PLATELET
BASO%: 0.7 % (ref 0.0–2.0)
BASOS ABS: 0.1 10*3/uL (ref 0.0–0.1)
EOS%: 3.1 % (ref 0.0–7.0)
Eosinophils Absolute: 0.2 10*3/uL (ref 0.0–0.5)
HEMATOCRIT: 39.9 % (ref 38.4–49.9)
HGB: 14 g/dL (ref 13.0–17.1)
LYMPH#: 1.5 10*3/uL (ref 0.9–3.3)
LYMPH%: 20.9 % (ref 14.0–49.0)
MCH: 33.7 pg — AB (ref 27.2–33.4)
MCHC: 35.2 g/dL (ref 32.0–36.0)
MCV: 95.7 fL (ref 79.3–98.0)
MONO#: 0.7 10*3/uL (ref 0.1–0.9)
MONO%: 10 % (ref 0.0–14.0)
NEUT#: 4.7 10*3/uL (ref 1.5–6.5)
NEUT%: 65.3 % (ref 39.0–75.0)
PLATELETS: 207 10*3/uL (ref 140–400)
RBC: 4.17 10*6/uL — AB (ref 4.20–5.82)
RDW: 13.7 % (ref 11.0–14.6)
WBC: 7.2 10*3/uL (ref 4.0–10.3)

## 2017-02-19 LAB — FERRITIN: Ferritin: 217 ng/ml (ref 22–316)

## 2017-02-19 MED ORDER — SODIUM CHLORIDE 0.9% FLUSH
10.0000 mL | Freq: Once | INTRAVENOUS | Status: AC
  Administered 2017-02-19: 10 mL
  Filled 2017-02-19: qty 10

## 2017-02-19 NOTE — Telephone Encounter (Signed)
This RN was removing old dressing and STAT loc- with dressing adhered to stat loc. During attempt of removing dressing from stat loc - noted line with drew from insertion site by 5 marks.  IR contacted with instructions to send pt for xray for placement of tip and possible need for revision.  With the help of a 2nd nurse old dressing and stat loc removed.  New dressing and stat loc applied - including bio patch.  Pt and wife sent to radiology.  Note pt had no discomfort per above nor post dressing change.

## 2017-02-19 NOTE — Patient Instructions (Signed)

## 2017-02-20 ENCOUNTER — Other Ambulatory Visit: Payer: Self-pay | Admitting: *Deleted

## 2017-02-20 DIAGNOSIS — Z452 Encounter for adjustment and management of vascular access device: Secondary | ICD-10-CM

## 2017-02-25 ENCOUNTER — Ambulatory Visit (HOSPITAL_COMMUNITY)
Admission: RE | Admit: 2017-02-25 | Discharge: 2017-02-25 | Disposition: A | Discharge status: Home / Self Care | Payer: Medicare Other | Source: Ambulatory Visit | Attending: Adult Health | Admitting: Adult Health

## 2017-02-25 ENCOUNTER — Encounter (HOSPITAL_COMMUNITY): Payer: Self-pay | Admitting: Radiology

## 2017-02-25 DIAGNOSIS — T82524A Displacement of infusion catheter, initial encounter: Secondary | ICD-10-CM | POA: Insufficient documentation

## 2017-02-25 DIAGNOSIS — Z452 Encounter for adjustment and management of vascular access device: Secondary | ICD-10-CM

## 2017-02-25 DIAGNOSIS — Y712 Prosthetic and other implants, materials and accessory cardiovascular devices associated with adverse incidents: Secondary | ICD-10-CM | POA: Diagnosis not present

## 2017-02-25 HISTORY — PX: IR FLUORO GUIDE CV LINE RIGHT: IMG2283

## 2017-02-25 MED ORDER — LIDOCAINE HCL 1 % IJ SOLN
INTRAMUSCULAR | Status: AC | PRN
  Administered 2017-02-25: 5 mL

## 2017-02-25 MED ORDER — LIDOCAINE HCL 1 % IJ SOLN
INTRAMUSCULAR | Status: AC
  Filled 2017-02-25: qty 20

## 2017-02-25 NOTE — Procedures (Signed)
Fluoroscopic guided exchange of existing right upper extremity PICC for new 48 cm right upper extremity PICC; tip SVC /right atrial junction. No immediate complications. Okay to use.

## 2017-02-26 ENCOUNTER — Other Ambulatory Visit (HOSPITAL_BASED_OUTPATIENT_CLINIC_OR_DEPARTMENT_OTHER): Payer: Medicare Other

## 2017-02-26 ENCOUNTER — Ambulatory Visit (HOSPITAL_BASED_OUTPATIENT_CLINIC_OR_DEPARTMENT_OTHER): Payer: Medicare Other

## 2017-02-26 ENCOUNTER — Ambulatory Visit: Payer: Medicare Other

## 2017-02-26 VITALS — BP 118/74 | HR 73 | Temp 97.9°F | Resp 18

## 2017-02-26 DIAGNOSIS — Z452 Encounter for adjustment and management of vascular access device: Secondary | ICD-10-CM | POA: Diagnosis present

## 2017-02-26 LAB — CBC WITH DIFFERENTIAL/PLATELET
BASO%: 0.7 % (ref 0.0–2.0)
BASOS ABS: 0.1 10*3/uL (ref 0.0–0.1)
EOS ABS: 0.3 10*3/uL (ref 0.0–0.5)
EOS%: 4.5 % (ref 0.0–7.0)
HEMATOCRIT: 35.5 % — AB (ref 38.4–49.9)
HEMOGLOBIN: 12.5 g/dL — AB (ref 13.0–17.1)
LYMPH%: 18.1 % (ref 14.0–49.0)
MCH: 34.3 pg — AB (ref 27.2–33.4)
MCHC: 35.2 g/dL (ref 32.0–36.0)
MCV: 97.3 fL (ref 79.3–98.0)
MONO#: 0.6 10*3/uL (ref 0.1–0.9)
MONO%: 7.7 % (ref 0.0–14.0)
NEUT#: 5.2 10*3/uL (ref 1.5–6.5)
NEUT%: 69 % (ref 39.0–75.0)
PLATELETS: 200 10*3/uL (ref 140–400)
RBC: 3.65 10*6/uL — ABNORMAL LOW (ref 4.20–5.82)
RDW: 14 % (ref 11.0–14.6)
WBC: 7.5 10*3/uL (ref 4.0–10.3)
lymph#: 1.3 10*3/uL (ref 0.9–3.3)

## 2017-02-26 LAB — FERRITIN: FERRITIN: 174 ng/mL (ref 22–316)

## 2017-02-26 MED ORDER — SODIUM CHLORIDE 0.9% FLUSH
10.0000 mL | Freq: Once | INTRAVENOUS | Status: AC
  Administered 2017-02-26: 10 mL
  Filled 2017-02-26: qty 10

## 2017-02-26 NOTE — Progress Notes (Signed)
Therapeutic phlebotomy performed per parameters. 500cc blood withdrew from PICC line from 1407 - 1428. Patient tolerated procedure well. Drink offered, snack refused. observation initiated. Patient stable upon discharge    Blane Ohara, BSN, RN 02/26/2017 2:38 PM

## 2017-02-27 ENCOUNTER — Telehealth: Payer: Self-pay | Admitting: Medical

## 2017-02-27 ENCOUNTER — Telehealth: Payer: Self-pay

## 2017-02-27 ENCOUNTER — Ambulatory Visit (HOSPITAL_BASED_OUTPATIENT_CLINIC_OR_DEPARTMENT_OTHER): Payer: Medicare Other | Admitting: Medical

## 2017-02-27 ENCOUNTER — Telehealth: Payer: Self-pay | Admitting: *Deleted

## 2017-02-27 VITALS — BP 113/63 | HR 70 | Temp 97.6°F | Resp 18 | Ht 73.0 in | Wt 204.5 lb

## 2017-02-27 DIAGNOSIS — L03113 Cellulitis of right upper limb: Secondary | ICD-10-CM | POA: Diagnosis present

## 2017-02-27 MED ORDER — CEPHALEXIN 500 MG PO CAPS: 500.0000 mg | ORAL_CAPSULE | Freq: Four times a day (QID) | ORAL | 0 refills | Status: AC

## 2017-02-27 NOTE — Patient Instructions (Signed)
Eugene [name] was told to use Allegra 60 mg by mouth twice daily.  Or  Zyrtec 10 mg once daily

## 2017-02-27 NOTE — Telephone Encounter (Signed)
Returned pt's call regarding itching at PICC site.  Pt states itching woke him up at 0400 and he has a rash on skin around the dressing.  Pt states he does not want to take Benadryl because it makes him sleepy.  Pt states  He applied ice pack and that helps but itching continues.  Pt states he had a PICC dressing change on Monday, 02/25/17, and no problems with his phlebotomy yesterday.  This RN will cancel pt's flush appt for today and instead have pt come in and see Marga Hoots, PA.  Pt made aware of this change.  Msg sent to scheduling to schedule appt with Marga Hoots for 2pm today.

## 2017-02-27 NOTE — Telephone Encounter (Signed)
Ive spoken to [place] appt this pt.  Cancelled flush appt and sent msg to scheduling to schedule with Zenaida Niece at 2pm

## 2017-02-27 NOTE — Progress Notes (Signed)
[facility] Progress Note   ZEPH RIEBEL [id number] [birth year] 81 y.o.  [name] is managed by Dr. Jeanette Caprice  Actively treated with chemotherapy: no   Assessment: Plan:    Cellulitis of right upper extremity - Plan: cephALEXin (KEFLEX) 500 MG capsule   Cellulitis of the right upper extremity: Initial plans were to dose the patient with IV antibiotics today however the patient had a conflict in schedule and could not stay. He was given a prescription for Keflex 500 mg when necessary 4 times a day 7 days. He will return tomorrow if it appears that his cellulitis is progressing with either an increase in the area of erythema, increase in pruritus, or the development of fevers, chills, or rigors. Mr. Kushnir was given my cell phone number. His wife will take a picture of the area of cellulitis on his right upper extremity and will text message a picture to me on Friday. I will call the patient after seeing the picture and will give him recommendations regarding whether he should continue with his current course of Keflex or if he should be seen on Friday for IV antibiotics +/- consideration for removal of his PICC line. He has been instructed to return immediately should he have symptoms of concern as previously noted.  The patient was instructed to use Allegra 60 mg twice daily or Zyrtec 10 mg once daily for pruritus if needed.  Please see After Visit Summary for patient specific instructions.  Future Appointments Date Time Provider Department Center  03/05/2017 1:15 PM [facility] None  03/05/2017 2:00 PM [facility] G22 [facility] None  03/12/2017 1:15 PM [facility] None  03/12/2017 2:00 PM [facility] C9 [facility] None  03/19/2017 1:15 PM [facility] LAB 5 [facility] None  03/19/2017 2:00 PM [facility] B5 [facility] None  03/26/2017 1:15 PM [facility] LAB 6 [facility] None  03/26/2017 2:00 PM [facility]  A1 [facility] None  11/11/2017 9:00 AM [facility] VASC 3 [facility]  11/11/2017 9:45 AM Nada Libman, MD [facility]  01/30/2018 12:15 PM [facility] None  02/06/2018 1:30 PM Magrinat, Valentino Hue, MD [facility] None    No orders of the defined types were placed in this encounter.      Subjective:   Patient ID:  GRIFFYN KUCINSKI is a 81 y.o. (DOB 04/14/1936) male.  Chief Complaint:  Chief Complaint  Patient presents with  . Rash    HPI REECE FEHNEL is a 81 year old male with a history of hemochromatosis. He was last seen by Dr. Marikay Alar Magrinat on 02/06/2017 after having not been phlebotomized for 2 years. According to Dr. Marikay Alar Magrinat's note, the patient's ferritin level had risen and was beyond the trigger point for a phlebotomy. His therapeutic phlebotomies were restarted. He was last seen on 02/26/2017 when he had a therapeutic phlebotomy performed with 500 ML's of blood drawn from a PICC line. The patient tolerated the procedure well. He contacted the office on the morning of 03/09/2017 stating that he was having itching in his arm and around his PICC line which started yesterday. He awoke this morning with continued itching in his arm and presents to the office today for an evaluation. He reports that his nurse was removing the dressing over his PICC line 1 week ago when the PICC line backed out by around 3 cm. The PICC line was ultimately replaced on 02/25/2017. Betadine was used to clean the area around the PICC line on  10/01. A phlebotomy was completed on 10/02 and he developed itching on the evening of 02/26/2017. He put ice packs on his arms last evening with mild relief obtained. He reports that the mild erythema noted around the PICC line insertion point has been stable from before last Wednesday. He denies fevers, chills, sweats, pain, lymphadenopathy in the left arm, or exudate from the PICC line insertion site. The patient has an allergy to chlorhexidine. It  was determined that the BioPatch that was placed around the insertion site of the PICC line contains chlorhexidine.  Medications: I have reviewed the patient's current medications.  Allergies:  Allergies  Allergen Reactions  . Chlorhexidine Gluconate Itching    Don't use Chloraprep inside port a cath kits.  . Tape     Adhesive tape,   Only use paper tape    Past Medical History:  Diagnosis Date  . AAA (abdominal aortic aneurysm) (HCC) 06/29/2007   stent graft  . GERD (gastroesophageal reflux disease)   . Hemochromatosis   . Hyperlipidemia   . Hypertension   . Irritable bowel syndrome   . Permanent atrial fibrillation (HCC)   . Stroke [facility])    Transient Ischemic Attack    Past Surgical History:  Procedure Laterality Date  . ABDOMINAL AORTIC ANEURYSM REPAIR    . ENDOVASCULAR STENT INSERTION  07/18/2007   saccular infrarenal aortic aneurysm  . EYE SURGERY Bilateral    Cataract    . IR FLUORO GUIDE CV LINE RIGHT  02/13/2017  . IR FLUORO GUIDE CV LINE RIGHT  02/25/2017  . IR US GUIDE VASC ACCESS RIGHT  02/13/2017  . PICC LINE PLACE PERIPHERAL ([facility] HX)  Oct 21, 2014    Family History  Problem Relation Age of Onset  . Hypertension Mother   . Heart disease Mother        AAA, he passed in his 15 s of Heart Diseasse  . Heart disease Father        AAA history and he passed at age 34  of   Disease    Social History   Social History  . Marital status: Married    Spouse name: N/A  . Number of children: N/A  . Years of education: N/A   Occupational History  . Not on file.   Social History Main Topics  . Smoking status: Former Smoker    Types: Cigarettes    Quit date: 06/18/1999  . Smokeless tobacco: Never Used  . Alcohol use 0.0 oz/week    1 - 2 Standard drinks or equivalent per week  . Drug use: No  . Sexual activity: Not on file   Other Topics Concern  . Not on file   Social History Narrative  . No narrative on file    Past Medical History, Surgical history,  Social history, and Family history were reviewed and updated as appropriate.   Please see review of systems for further details on the patient's review from today.   Review of Systems:  Review of Systems  Constitutional: Negative for chills, diaphoresis and fever.       No rigors  Skin: Positive for color change and rash.       No pain at PICC line insertion site  Hematological: Negative for adenopathy.    Objective:   Physical Exam:  BP 113/63 (BP Location: Left Arm, Patient Position: Sitting)   Pulse 70   Temp 97.6 F (36.4 C) (Oral)   Resp 18   Ht  (1.854  m)   Wt 204 lb 8 oz (92.8 kg)   SpO2 100%   BMI 26.98 kg/m  ECOG: 0  Physical Exam  Constitutional: No distress.  HENT:  Head: Normocephalic and atraumatic.  Cardiovascular: Normal rate, regular rhythm and normal heart sounds.  Exam reveals no gallop and no friction rub.   No murmur heard. Pulmonary/Chest: Effort normal and breath sounds normal. No respiratory distress. He has no wheezes. He has no rales.  Lymphadenopathy:       Right axillary: No pectoral and no lateral adenopathy present.  Neurological: He is alert. Coordination normal.  Skin: Rash noted. He is not diaphoretic. There is erythema.          Lab Review:     Component Value Date/Time   NA 135 (L) 01/30/2017 1145   K 3.5 01/30/2017 1145   CL 94 (L) 07/27/2016 1040   CO2 27 01/30/2017 1145   GLUCOSE 87 01/30/2017 1145   BUN 11.5 01/30/2017 1145   CREATININE 1.1 01/30/2017 1145   CALCIUM 10.4 01/30/2017 1145   PROT 6.9 01/30/2017 1145   ALBUMIN 4.1 01/30/2017 1145   AST 22 01/30/2017 1145   ALT 22 01/30/2017 1145   ALKPHOS 57 01/30/2017 1145   BILITOT 1.59 (H) 01/30/2017 1145   GFRNONAA 73 07/27/2016 1040   GFRAA 84 07/27/2016 1040       Component Value Date/Time   WBC 7.5 02/26/2017 1315   WBC 6.3 07/20/2013 0930   RBC 3.65 (L) 02/26/2017 1315   RBC 4.21 (L) 07/20/2013 0930   HGB 12.5 (L) 02/26/2017 1315   HCT 35.5 (L)  02/26/2017 1315   PLT 200 02/26/2017 1315   MCV 97.3 02/26/2017 1315   MCH 34.3 (H) 02/26/2017 1315   MCHC 35.2 02/26/2017 1315   MCHC 33.0 07/20/2013 0930   RDW 14.0 02/26/2017 1315   LYMPHSABS 1.3 02/26/2017 1315   MONOABS 0.6 02/26/2017 1315   EOSABS 0.3 02/26/2017 1315   BASOSABS 0.1 02/26/2017 1315   -------------------------------  Imaging from last 24 hours (if applicable):  Radiology interpretation: Dg Chest 2 View  Result Date: 02/19/2017 CLINICAL DATA:  PICC line of retracted back cancer center. Determine catheter location. EXAM: CHEST  2 VIEW COMPARISON:  Chest radiograph November 22, 2009 FINDINGS: Cardiomediastinal silhouette is normal. Calcified aortic knob. Mildly increased lung volumes and chronic interstitial changes without pleural effusion or focal consolidation. Mild hyperinflation. Upper extremity vascular calcifications. Intact RIGHT PICC distal tip projects in proximal superior vena cava. No pneumothorax. Soft tissue planes and included osseous structures are nonsuspicious. Pectus excavatum. Old LEFT posterior ninth rib fracture. IMPRESSION: RIGHT PICC distal tip projects in proximal superior vena cava. COPD. Aortic Atherosclerosis (ICD10-I70.0). These results will be called to the ordering clinician or representative by the Radiology Department at the imaging location. Electronically Signed   By: Awilda Metro M.D.   On: 02/19/2017 15:38   Ir Fluoro Guide Cv Line Right  Result Date: 02/25/2017 INDICATION: Patient with hemochromatosis and prior right upper extremity PICC line placement 02/13/2017 for repeated phlebotomies. PICC line tip has been inadvertently retracted and request now received for PICC exchange. EXAM: RIGHT UPPER EXTREMITY PICC EXCHANGE MEDICATIONS: 1% lidocaine to skin and subcutaneous tissue. ANESTHESIA/SEDATION: None FLUOROSCOPY TIME:  Fluoroscopy Time:  60 seconds (7 mGy). COMPLICATIONS: None immediate. PROCEDURE: Informed written consent was  obtained from the patient after a thorough discussion of the procedural risks, benefits and alternatives. All questions were addressed. Maximal Sterile Barrier Technique was utilized including caps, mask, sterile  gowns, sterile gloves, sterile drape, hand hygiene and skin antiseptic. A timeout was performed prior to the initiation of the procedure. The existing PICC catheter entry site was cleaned and prepped in the usual sterile fashion. The PICC line tip was cut and a guidewire was advanced to the SVC/ right atrial junction. The existing PICC was removed over the wire and a new 48 cm single-lumen PICC was advanced to the SVC/ right atrial junction. Spot chest radiograph confirmed appropriate catheter position. Catheter was flushed per protocol and secured with sutures externally. The patient tolerated the procedure well . IMPRESSION: Successful exchange of existing right upper extremity single-lumen power injectable PICC for new right upper extremity single-lumen power injector PICC. Okay to use. Read by: Jeananne Rama, PA-C Electronically Signed   By: Judie Petit.  Shick M.D.   On: 02/25/2017 16:50   Ir Fluoro Guide Cv Line Right  Result Date: 02/13/2017 CLINICAL DATA:  Hemochromatosis, needs venous access for repeated phlebotomy EXAM: PICC PLACEMENT WITH ULTRASOUND AND FLUOROSCOPY FLUOROSCOPY TIME:  0.1 minute, 21 uGym2 DAP TECHNIQUE: After written informed consent was obtained, patient was placed in the supine position on angiographic table. Patency of the right basilic vein was confirmed with ultrasound with image documentation. An appropriate skin site was determined. Skin site was marked. Region was prepped using maximum barrier technique including cap and mask, sterile gown, sterile gloves, large sterile sheet, and Chlorhexidine as cutaneous antisepsis. The region was infiltrated locally with 1% lidocaine. Under real-time ultrasound guidance, the right basilic vein was accessed with a 21 gauge micropuncture  needle; the needle tip within the vein was confirmed with ultrasound image documentation. Needle exchanged over a 018 guidewire for a peel-away sheath, through which a 5-French single-lumen power injectable PICC trimmed to 44cm was advanced, positioned with its tip near the cavoatrial junction. Spot chest radiograph confirms appropriate catheter position. Catheter was flushed per protocol and secured externally. The patient tolerated procedure well. COMPLICATIONS: COMPLICATIONS none IMPRESSION: 1. Technically successful five [place] single lumen power injectable PICC placement Electronically Signed   By: Corlis Leak M.D.   On: 02/13/2017 16:07   Ir US Guide Vasc Access Right  Result Date: 02/13/2017 CLINICAL DATA:  Hemochromatosis, needs venous access for repeated phlebotomy EXAM: PICC PLACEMENT WITH ULTRASOUND AND FLUOROSCOPY FLUOROSCOPY TIME:  0.1 minute, 21 uGym2 DAP TECHNIQUE: After written informed consent was obtained, patient was placed in the supine position on angiographic table. Patency of the right basilic vein was confirmed with ultrasound with image documentation. An appropriate skin site was determined. Skin site was marked. Region was prepped using maximum barrier technique including cap and mask, sterile gown, sterile gloves, large sterile sheet, and Chlorhexidine as cutaneous antisepsis. The region was infiltrated locally with 1% lidocaine. Under real-time ultrasound guidance, the right basilic vein was accessed with a 21 gauge micropuncture needle; the needle tip within the vein was confirmed with ultrasound image documentation. Needle exchanged over a 018 guidewire for a peel-away sheath, through which a 5-French single-lumen power injectable PICC trimmed to 44cm was advanced, positioned with its tip near the cavoatrial junction. Spot chest radiograph confirms appropriate catheter position. Catheter was flushed per protocol and secured externally. The patient tolerated procedure well.  COMPLICATIONS: COMPLICATIONS none IMPRESSION: 1. Technically successful five [place] single lumen power injectable PICC placement Electronically Signed   By: Corlis Leak M.D.   On: 02/13/2017 16:07

## 2017-02-27 NOTE — Telephone Encounter (Signed)
"  Someone needs to look at my PICC line.  Started itching profusely yesterday.  Woke me up at 4:00 am.  Something is wrong, having a lot of problems with this Dr. Darrall Dears nurse knows about the problems.  Return number [phone]."   Scheduling message sent.

## 2017-02-27 NOTE — Telephone Encounter (Signed)
Scheduled appt per 10/3 sch msg. Patient is aware of the time.

## 2017-02-27 NOTE — Telephone Encounter (Signed)
Per Marga Hoots okay to disregard the 10/3 los - patient does not need appt 10/5 - if needed he will walk in - patient is suppose to call Zenaida Niece in the am to determine if f/u Is needed.

## 2017-02-27 NOTE — Progress Notes (Signed)
Pt arrived to [facility] with c/o itching under PICC line dressing . Dressing (opsite) removed, biopatch removed. Noted skin directly under biopatch to be quite erythematous in the same shape as biopatch. Also noted swelling and redness distal to picc line and to the 2 o'clock position in relation to insertion site. Skin warm to touch, and with some swelling.  No fever/chills per pt report.  Marga Hoots, PA present for exam/evaluation of picc line site. Noted pt is allergic to adhesive tape and chlorhexadine. Redressed picc line with betadine and no biopatch as biopatch has chlorhexadine in it.  Noted this allergy on patient care coordination note.  See Marga Hoots, PA note for additional details. Pt to be started on po keflex QID. This has been escribed to pt's pharmacy. Pt voiced understanding of new medication.  We will follow up with pt on Friday, 03/01/17

## 2017-02-27 NOTE — Telephone Encounter (Signed)
Message left on patient voicemail requesting arrival at 1:15 pm today to registration for 1:45 flush appointment for P.I.C.C dressing change and further evaluation.

## 2017-03-05 ENCOUNTER — Ambulatory Visit (HOSPITAL_BASED_OUTPATIENT_CLINIC_OR_DEPARTMENT_OTHER): Payer: Medicare Other

## 2017-03-05 ENCOUNTER — Other Ambulatory Visit (HOSPITAL_BASED_OUTPATIENT_CLINIC_OR_DEPARTMENT_OTHER): Payer: Medicare Other

## 2017-03-05 ENCOUNTER — Telehealth: Payer: Self-pay | Admitting: *Deleted

## 2017-03-05 VITALS — BP 106/61 | HR 83 | Temp 98.0°F | Resp 18

## 2017-03-05 DIAGNOSIS — Z452 Encounter for adjustment and management of vascular access device: Secondary | ICD-10-CM

## 2017-03-05 LAB — CBC WITH DIFFERENTIAL/PLATELET
BASO%: 0.7 % (ref 0.0–2.0)
Basophils Absolute: 0.1 10*3/uL (ref 0.0–0.1)
EOS%: 2.8 % (ref 0.0–7.0)
Eosinophils Absolute: 0.3 10*3/uL (ref 0.0–0.5)
HCT: 36.3 % — ABNORMAL LOW (ref 38.4–49.9)
HGB: 12.5 g/dL — ABNORMAL LOW (ref 13.0–17.1)
LYMPH%: 14 % (ref 14.0–49.0)
MCH: 34 pg — ABNORMAL HIGH (ref 27.2–33.4)
MCHC: 34.5 g/dL (ref 32.0–36.0)
MCV: 98.4 fL — ABNORMAL HIGH (ref 79.3–98.0)
MONO#: 0.7 10*3/uL (ref 0.1–0.9)
MONO%: 7.5 % (ref 0.0–14.0)
NEUT%: 75 % (ref 39.0–75.0)
NEUTROS ABS: 6.9 10*3/uL — AB (ref 1.5–6.5)
PLATELETS: 206 10*3/uL (ref 140–400)
RBC: 3.69 10*6/uL — AB (ref 4.20–5.82)
RDW: 14.5 % (ref 11.0–14.6)
WBC: 9.2 10*3/uL (ref 4.0–10.3)
lymph#: 1.3 10*3/uL (ref 0.9–3.3)

## 2017-03-05 MED ORDER — HEPARIN SOD (PORK) LOCK FLUSH 100 UNIT/ML IV SOLN
250.0000 [IU] | Freq: Once | INTRAVENOUS | Status: AC
  Administered 2017-03-05: 250 [IU]
  Filled 2017-03-05: qty 5

## 2017-03-05 MED ORDER — SODIUM CHLORIDE 0.9% FLUSH
10.0000 mL | Freq: Once | INTRAVENOUS | Status: AC
  Administered 2017-03-05: 10 mL
  Filled 2017-03-05: qty 10

## 2017-03-05 NOTE — Progress Notes (Signed)
Pt stayed until 1510 for monitoring after phlebotomy.  PICC line dressing changed.  A&OX4.  Ambulatory w/steady gait.  Drank fluids throughout visit.

## 2017-03-05 NOTE — Patient Instructions (Signed)
Therapeutic Phlebotomy Therapeutic phlebotomy is the controlled removal of blood from a person's body for the purpose of treating a medical condition. The procedure is similar to donating blood. Usually, about a pint (470 mL, or 0.47L) of blood is removed. The average adult has 9-12 pints (4.3-5.7 L) of blood. Therapeutic phlebotomy may be used to treat the following medical conditions:  Hemochromatosis. This is a condition in which the blood contains too much iron.  Polycythemia vera. This is a condition in which the blood contains too many red blood cells.  Porphyria cutanea tarda. This is a disease in which an important part of hemoglobin is not made properly. It results in the buildup of abnormal amounts of porphyrins in the body.  Sickle cell disease. This is a condition in which the red blood cells form an abnormal crescent shape rather than a round shape.  Tell a health care provider about:  Any allergies you have.  All medicines you are taking, including vitamins, herbs, eye drops, creams, and over-the-counter medicines.  Any problems you or family members have had with anesthetic medicines.  Any blood disorders you have.  Any surgeries you have had.  Any medical conditions you have. What are the risks? Generally, this is a safe procedure. However, problems may occur, including:  Nausea or light-headedness.  Low blood pressure.  Soreness, bleeding, swelling, or bruising at the needle insertion site.  Infection.  What happens before the procedure?  Follow instructions from your health care provider about eating or drinking restrictions.  Ask your health care provider about changing or stopping your regular medicines. This is especially important if you are taking diabetes medicines or blood thinners.  Wear clothing with sleeves that can be raised above the elbow.  Plan to have someone take you home after the procedure.  You may have a blood sample taken. What  happens during the procedure?  A needle will be inserted into one of your veins.  Tubing and a collection bag will be attached to that needle.  Blood will flow through the needle and tubing into the collection bag.  You may be asked to open and close your hand slowly and continually during the entire collection.  After the specified amount of blood has been removed from your body, the collection bag and tubing will be clamped.  The needle will be removed from your vein.  Pressure will be held on the site of the needle insertion to stop the bleeding.  A bandage (dressing) will be placed over the needle insertion site. The procedure may vary among health care providers and hospitals. What happens after the procedure?  Your recovery will be assessed and monitored.  You can return to your normal activities as directed by your health care provider. This information is not intended to replace advice given to you by your health care provider. Make sure you discuss any questions you have with your health care provider. Document Released: 10/16/2010 Document Revised: 01/14/2016 Document Reviewed: 05/10/2014 Elsevier Interactive Patient Education  2018 Elsevier Inc.  

## 2017-03-05 NOTE — Progress Notes (Signed)
Therapeutic phlebotomy performed per MD order starting at 1426 and ending at 1439 removing at total of 525 mls. Pt tolerated well. Snack and drink offered, drink provided, pt declined snack.

## 2017-03-05 NOTE — Telephone Encounter (Signed)
May precede with phlebotomy today per lab from last week of ferritin of 174 pre phlebotomy.  Ferritin will be drawn today to be used for phlebotomy scheduled next week.  ( do not need to wait on results today for phlebotomy ).  Above per Dr Darnelle Catalan.

## 2017-03-06 ENCOUNTER — Telehealth: Payer: Self-pay | Admitting: *Deleted

## 2017-03-06 LAB — FERRITIN: FERRITIN: 154 ng/mL (ref 22–316)

## 2017-03-06 MED ORDER — ZOLPIDEM TARTRATE 5 MG PO TABS: 5.0000 mg | ORAL_TABLET | Freq: Every evening | ORAL | 1 refills | Status: DC | PRN

## 2017-03-06 NOTE — Telephone Encounter (Signed)
Pt called to this RN to state issues of insomnia secondary to PICC line - " I know it sounds crazy but I have claustrophobia and ever since getting this PICC I cannot get to sleep at night"  Eugene [name] states he has used Ambien in the past with good benefit and is requesting a new prescription.  Prescription obtained and faxed to verified pharmacy.

## 2017-03-07 ENCOUNTER — Encounter: Payer: Self-pay | Admitting: Medical

## 2017-03-07 ENCOUNTER — Encounter: Payer: Self-pay | Admitting: Oncology

## 2017-03-07 NOTE — Progress Notes (Signed)
Received PA request/rejection message from [facility] for Zolpidem tartrate.  Submitted request via Cover My Meds.  Eugene [name] (Key: [id number])

## 2017-03-07 NOTE — Progress Notes (Signed)
Received fax stating Zolpidem tartrate is already covered under plan with no auth required.  Called [facility] pharmacy(Renee) back to advise and they state patient paid for medication and picked up already. Advised to note to run as 30 for 30 instead of 30 for 15. She states she would make a note of it.

## 2017-03-12 ENCOUNTER — Ambulatory Visit (HOSPITAL_BASED_OUTPATIENT_CLINIC_OR_DEPARTMENT_OTHER): Payer: Medicare Other

## 2017-03-12 ENCOUNTER — Other Ambulatory Visit (HOSPITAL_BASED_OUTPATIENT_CLINIC_OR_DEPARTMENT_OTHER): Payer: Medicare Other

## 2017-03-12 VITALS — BP 123/66 | HR 78 | Temp 97.7°F | Resp 18

## 2017-03-12 DIAGNOSIS — Z452 Encounter for adjustment and management of vascular access device: Secondary | ICD-10-CM

## 2017-03-12 LAB — CBC WITH DIFFERENTIAL/PLATELET
BASO%: 0.1 % (ref 0.0–2.0)
Basophils Absolute: 0 10*3/uL (ref 0.0–0.1)
EOS ABS: 0.1 10*3/uL (ref 0.0–0.5)
EOS%: 1.4 % (ref 0.0–7.0)
HCT: 34.4 % — ABNORMAL LOW (ref 38.4–49.9)
HGB: 12.1 g/dL — ABNORMAL LOW (ref 13.0–17.1)
LYMPH%: 14.4 % (ref 14.0–49.0)
MCH: 34 pg — ABNORMAL HIGH (ref 27.2–33.4)
MCHC: 35.2 g/dL (ref 32.0–36.0)
MCV: 96.6 fL (ref 79.3–98.0)
MONO#: 0.6 10*3/uL (ref 0.1–0.9)
MONO%: 7.5 % (ref 0.0–14.0)
NEUT%: 76.6 % — AB (ref 39.0–75.0)
NEUTROS ABS: 6.2 10*3/uL (ref 1.5–6.5)
PLATELETS: 206 10*3/uL (ref 140–400)
RBC: 3.56 10*6/uL — ABNORMAL LOW (ref 4.20–5.82)
RDW: 14.3 % (ref 11.0–14.6)
WBC: 8.1 10*3/uL (ref 4.0–10.3)
lymph#: 1.2 10*3/uL (ref 0.9–3.3)

## 2017-03-12 LAB — FERRITIN: FERRITIN: 122 ng/mL (ref 22–316)

## 2017-03-12 MED ORDER — HEPARIN SOD (PORK) LOCK FLUSH 100 UNIT/ML IV SOLN
250.0000 [IU] | INTRAVENOUS | Status: AC | PRN
Start: 2017-03-12 — End: 2017-03-12
  Administered 2017-03-12: 250 [IU]
  Filled 2017-03-12: qty 5

## 2017-03-12 MED ORDER — SODIUM CHLORIDE 0.9% FLUSH
10.0000 mL | INTRAVENOUS | Status: AC | PRN
  Administered 2017-03-12: 10 mL
  Filled 2017-03-12: qty 10

## 2017-03-12 NOTE — Progress Notes (Signed)
[id number] Therapeutic phlebotomy performed using PICC line in R arm. 500 ml of blood removed without difficulty. Patient has been offered nourishment and will stay for 30 minutes of observation.

## 2017-03-12 NOTE — Patient Instructions (Signed)
Therapeutic Phlebotomy Therapeutic phlebotomy is the controlled removal of blood from a person's body for the purpose of treating a medical condition. The procedure is similar to donating blood. Usually, about a pint (470 mL, or 0.47L) of blood is removed. The average adult has 9-12 pints (4.3-5.7 L) of blood. Therapeutic phlebotomy may be used to treat the following medical conditions:  Hemochromatosis. This is a condition in which the blood contains too much iron.  Polycythemia vera. This is a condition in which the blood contains too many red blood cells.  Porphyria cutanea tarda. This is a disease in which an important part of hemoglobin is not made properly. It results in the buildup of abnormal amounts of porphyrins in the body.  Sickle cell disease. This is a condition in which the red blood cells form an abnormal crescent shape rather than a round shape.  Tell a health care provider about:  Any allergies you have.  All medicines you are taking, including vitamins, herbs, eye drops, creams, and over-the-counter medicines.  Any problems you or family members have had with anesthetic medicines.  Any blood disorders you have.  Any surgeries you have had.  Any medical conditions you have. What are the risks? Generally, this is a safe procedure. However, problems may occur, including:  Nausea or light-headedness.  Low blood pressure.  Soreness, bleeding, swelling, or bruising at the needle insertion site.  Infection.  What happens before the procedure?  Follow instructions from your health care provider about eating or drinking restrictions.  Ask your health care provider about changing or stopping your regular medicines. This is especially important if you are taking diabetes medicines or blood thinners.  Wear clothing with sleeves that can be raised above the elbow.  Plan to have someone take you home after the procedure.  You may have a blood sample taken. What  happens during the procedure?  A needle will be inserted into one of your veins.  Tubing and a collection bag will be attached to that needle.  Blood will flow through the needle and tubing into the collection bag.  You may be asked to open and close your hand slowly and continually during the entire collection.  After the specified amount of blood has been removed from your body, the collection bag and tubing will be clamped.  The needle will be removed from your vein.  Pressure will be held on the site of the needle insertion to stop the bleeding.  A bandage (dressing) will be placed over the needle insertion site. The procedure may vary among health care providers and hospitals. What happens after the procedure?  Your recovery will be assessed and monitored.  You can return to your normal activities as directed by your health care provider. This information is not intended to replace advice given to you by your health care provider. Make sure you discuss any questions you have with your health care provider. Document Released: 10/16/2010 Document Revised: 01/14/2016 Document Reviewed: 05/10/2014 Elsevier Interactive Patient Education  2018 Elsevier Inc.  

## 2017-03-14 ENCOUNTER — Other Ambulatory Visit: Payer: Self-pay

## 2017-03-14 ENCOUNTER — Telehealth: Payer: Self-pay

## 2017-03-14 NOTE — Telephone Encounter (Signed)
New appts have been added for pt.  Pt is aware.

## 2017-03-14 NOTE — Telephone Encounter (Signed)
Returned call on number provided in pt's vs.  LVM

## 2017-03-15 NOTE — Telephone Encounter (Signed)
No entry 

## 2017-03-19 ENCOUNTER — Other Ambulatory Visit (HOSPITAL_BASED_OUTPATIENT_CLINIC_OR_DEPARTMENT_OTHER): Payer: Medicare Other

## 2017-03-19 ENCOUNTER — Ambulatory Visit (HOSPITAL_BASED_OUTPATIENT_CLINIC_OR_DEPARTMENT_OTHER): Payer: Medicare Other

## 2017-03-19 VITALS — BP 107/70 | HR 61 | Temp 97.9°F | Resp 16

## 2017-03-19 DIAGNOSIS — Z452 Encounter for adjustment and management of vascular access device: Secondary | ICD-10-CM | POA: Diagnosis present

## 2017-03-19 LAB — CBC WITH DIFFERENTIAL/PLATELET
BASO%: 1.1 % (ref 0.0–2.0)
BASOS ABS: 0.1 10*3/uL (ref 0.0–0.1)
EOS ABS: 0.2 10*3/uL (ref 0.0–0.5)
EOS%: 3.1 % (ref 0.0–7.0)
HEMATOCRIT: 35.7 % — AB (ref 38.4–49.9)
HEMOGLOBIN: 12.2 g/dL — AB (ref 13.0–17.1)
LYMPH#: 1.3 10*3/uL (ref 0.9–3.3)
LYMPH%: 18.6 % (ref 14.0–49.0)
MCH: 34.3 pg — AB (ref 27.2–33.4)
MCHC: 34.3 g/dL (ref 32.0–36.0)
MCV: 100.1 fL — ABNORMAL HIGH (ref 79.3–98.0)
MONO#: 0.6 10*3/uL (ref 0.1–0.9)
MONO%: 8.2 % (ref 0.0–14.0)
NEUT#: 4.9 10*3/uL (ref 1.5–6.5)
NEUT%: 69 % (ref 39.0–75.0)
PLATELETS: 232 10*3/uL (ref 140–400)
RBC: 3.56 10*6/uL — ABNORMAL LOW (ref 4.20–5.82)
RDW: 14 % (ref 11.0–14.6)
WBC: 7.1 10*3/uL (ref 4.0–10.3)

## 2017-03-19 LAB — FERRITIN: Ferritin: 95 ng/ml (ref 22–316)

## 2017-03-19 MED ORDER — HEPARIN SOD (PORK) LOCK FLUSH 100 UNIT/ML IV SOLN
250.0000 [IU] | Freq: Once | INTRAVENOUS | Status: AC
  Administered 2017-03-19: 250 [IU]
  Filled 2017-03-19: qty 5

## 2017-03-19 MED ORDER — SODIUM CHLORIDE 0.9% FLUSH
10.0000 mL | Freq: Once | INTRAVENOUS | Status: AC
  Administered 2017-03-19: 10 mL
  Filled 2017-03-19: qty 10

## 2017-03-19 NOTE — Progress Notes (Signed)
Therapeutic phlebotomy preformed via right PICC line.  removed over approximately 15 minutes 1355-1410. Pt tolerated well.  Pt A&O, Drink & snack provided.  Observed 30 minutes post procedure.  PICC dressing/cap changed.

## 2017-03-19 NOTE — Patient Instructions (Signed)

## 2017-03-26 ENCOUNTER — Ambulatory Visit (HOSPITAL_BASED_OUTPATIENT_CLINIC_OR_DEPARTMENT_OTHER): Payer: Medicare Other

## 2017-03-26 ENCOUNTER — Other Ambulatory Visit (HOSPITAL_BASED_OUTPATIENT_CLINIC_OR_DEPARTMENT_OTHER): Payer: Medicare Other

## 2017-03-26 DIAGNOSIS — Z452 Encounter for adjustment and management of vascular access device: Secondary | ICD-10-CM

## 2017-03-26 LAB — CBC WITH DIFFERENTIAL/PLATELET
BASO%: 0.7 % (ref 0.0–2.0)
Basophils Absolute: 0 10*3/uL (ref 0.0–0.1)
EOS ABS: 0.2 10*3/uL (ref 0.0–0.5)
EOS%: 2.4 % (ref 0.0–7.0)
HCT: 34.8 % — ABNORMAL LOW (ref 38.4–49.9)
HGB: 12.1 g/dL — ABNORMAL LOW (ref 13.0–17.1)
LYMPH#: 1.2 10*3/uL (ref 0.9–3.3)
LYMPH%: 17.6 % (ref 14.0–49.0)
MCH: 34.2 pg — ABNORMAL HIGH (ref 27.2–33.4)
MCHC: 34.7 g/dL (ref 32.0–36.0)
MCV: 98.6 fL — ABNORMAL HIGH (ref 79.3–98.0)
MONO#: 0.5 10*3/uL (ref 0.1–0.9)
MONO%: 8.3 % (ref 0.0–14.0)
NEUT#: 4.7 10*3/uL (ref 1.5–6.5)
NEUT%: 71 % (ref 39.0–75.0)
Platelets: 228 10*3/uL (ref 140–400)
RBC: 3.53 10*6/uL — AB (ref 4.20–5.82)
RDW: 13.4 % (ref 11.0–14.6)
WBC: 6.6 10*3/uL (ref 4.0–10.3)

## 2017-03-26 LAB — FERRITIN: Ferritin: 70 ng/ml (ref 22–316)

## 2017-03-26 NOTE — Progress Notes (Signed)
Evette CristalJames A Clayburn presents today for phlebotomy per MD orders. VSS. Phlebotomy procedure started at 1422 and ended at 1443. 500 grams removed via PICC. Patient tolerated procedure well. Pt declined to stay for 30 minute post-observation.

## 2017-03-26 NOTE — Patient Instructions (Signed)

## 2017-04-02 ENCOUNTER — Other Ambulatory Visit: Payer: Self-pay | Admitting: *Deleted

## 2017-04-02 ENCOUNTER — Ambulatory Visit (HOSPITAL_BASED_OUTPATIENT_CLINIC_OR_DEPARTMENT_OTHER): Payer: Medicare Other

## 2017-04-02 ENCOUNTER — Other Ambulatory Visit (HOSPITAL_BASED_OUTPATIENT_CLINIC_OR_DEPARTMENT_OTHER): Payer: Medicare Other

## 2017-04-02 VITALS — BP 111/72 | HR 78 | Temp 99.5°F | Resp 17

## 2017-04-02 DIAGNOSIS — Z452 Encounter for adjustment and management of vascular access device: Secondary | ICD-10-CM

## 2017-04-02 LAB — CBC WITH DIFFERENTIAL/PLATELET
BASO%: 0.7 % (ref 0.0–2.0)
BASOS ABS: 0 10*3/uL (ref 0.0–0.1)
EOS%: 2.9 % (ref 0.0–7.0)
Eosinophils Absolute: 0.2 10*3/uL (ref 0.0–0.5)
HCT: 34.8 % — ABNORMAL LOW (ref 38.4–49.9)
HGB: 11.9 g/dL — ABNORMAL LOW (ref 13.0–17.1)
LYMPH%: 17 % (ref 14.0–49.0)
MCH: 33.5 pg — AB (ref 27.2–33.4)
MCHC: 34.2 g/dL (ref 32.0–36.0)
MCV: 97.9 fL (ref 79.3–98.0)
MONO#: 0.6 10*3/uL (ref 0.1–0.9)
MONO%: 9.1 % (ref 0.0–14.0)
NEUT#: 4.5 10*3/uL (ref 1.5–6.5)
NEUT%: 70.3 % (ref 39.0–75.0)
Platelets: 224 10*3/uL (ref 140–400)
RBC: 3.56 10*6/uL — ABNORMAL LOW (ref 4.20–5.82)
RDW: 13.2 % (ref 11.0–14.6)
WBC: 6.4 10*3/uL (ref 4.0–10.3)
lymph#: 1.1 10*3/uL (ref 0.9–3.3)

## 2017-04-02 LAB — FERRITIN: FERRITIN: 53 ng/mL (ref 22–316)

## 2017-04-02 NOTE — Progress Notes (Signed)
Pt aware to call or seek emergency care if any excessive bleeding noted from dressing prior to removal in 6 hours. Pt aware to call or seek care for any increased shortness of breath. Pt verbalized understanding and had no further questions.

## 2017-04-02 NOTE — Patient Instructions (Signed)

## 2017-04-02 NOTE — Progress Notes (Signed)
Evette CristalJames A Kilts presents today for phlebotomy per MD orders. Phlebotomy procedure started at 1420 and ended at 1440. 510 grams removed via right PICC line without difficulty. Pt provided with post treatment hydration and snacks. After phlebotomy order received to remove pt PICC line. Vikki PortsValerie, RN at bedside to perform removal with this RN observing. PICC line removed at 1450 measuring 48 cm. Pressure dressing applied per policy and pt placed in reclining position. Pt observed for 30 minutes without incident. No bleeding noted on dressing. Post observation vitals stable. Pt left with wife ambulatory in no apparent distress.

## 2017-04-03 ENCOUNTER — Telehealth: Payer: Self-pay | Admitting: Oncology

## 2017-04-03 NOTE — Telephone Encounter (Signed)
Scheduled appt per 11/6 sch message - left message for patient to pick up an updated schedule on 11/13 next visit.

## 2017-04-08 ENCOUNTER — Telehealth: Payer: Self-pay | Admitting: *Deleted

## 2017-04-08 NOTE — Telephone Encounter (Signed)
This RN spoke with pt per his call stating ongoing irritation at site of PICC line ( d/ced 11/6) - " still itching and red "  No noted drainage or swelling but site " just itches and gets splotchy red ( intermittant ).  Eugene [name] is putting lotion on site.  This RN offered for Eugene [name] to come in today for assessment - he would prefer to wait until scheduled time tomorrow.  This RN advised Eugene [name] to put benadryl and hydrocortisone cream on site for benefit.  This RN will follow up with pt at scheduled lab on 04/09/2017.

## 2017-04-09 ENCOUNTER — Other Ambulatory Visit (HOSPITAL_BASED_OUTPATIENT_CLINIC_OR_DEPARTMENT_OTHER): Payer: Medicare Other

## 2017-04-09 LAB — CBC WITH DIFFERENTIAL/PLATELET
BASO%: 0.7 % (ref 0.0–2.0)
Basophils Absolute: 0 10*3/uL (ref 0.0–0.1)
EOS%: 2.8 % (ref 0.0–7.0)
Eosinophils Absolute: 0.2 10*3/uL (ref 0.0–0.5)
HEMATOCRIT: 36.1 % — AB (ref 38.4–49.9)
HEMOGLOBIN: 12.1 g/dL — AB (ref 13.0–17.1)
LYMPH#: 1.1 10*3/uL (ref 0.9–3.3)
LYMPH%: 16.6 % (ref 14.0–49.0)
MCH: 32.8 pg (ref 27.2–33.4)
MCHC: 33.4 g/dL (ref 32.0–36.0)
MCV: 98.3 fL — AB (ref 79.3–98.0)
MONO#: 0.6 10*3/uL (ref 0.1–0.9)
MONO%: 8.5 % (ref 0.0–14.0)
NEUT%: 71.4 % (ref 39.0–75.0)
NEUTROS ABS: 4.7 10*3/uL (ref 1.5–6.5)
Platelets: 241 10*3/uL (ref 140–400)
RBC: 3.67 10*6/uL — ABNORMAL LOW (ref 4.20–5.82)
RDW: 12.8 % (ref 11.0–14.6)
WBC: 6.6 10*3/uL (ref 4.0–10.3)

## 2017-04-09 LAB — FERRITIN: FERRITIN: 41 ng/mL (ref 22–316)

## 2017-04-16 ENCOUNTER — Other Ambulatory Visit: Payer: Medicare Other

## 2017-04-23 ENCOUNTER — Other Ambulatory Visit: Payer: Medicare Other

## 2017-07-04 ENCOUNTER — Inpatient Hospital Stay: Payer: Medicare Other | Attending: Oncology

## 2017-07-04 LAB — CBC WITH DIFFERENTIAL/PLATELET
BASOS ABS: 0.1 10*3/uL (ref 0.0–0.1)
BASOS PCT: 1 %
Eosinophils Absolute: 0.2 10*3/uL (ref 0.0–0.5)
Eosinophils Relative: 3 %
HEMATOCRIT: 42.2 % (ref 38.4–49.9)
Hemoglobin: 14.2 g/dL (ref 13.0–17.1)
Lymphocytes Relative: 23 %
Lymphs Abs: 1.8 10*3/uL (ref 0.9–3.3)
MCH: 30.4 pg (ref 27.2–33.4)
MCHC: 33.7 g/dL (ref 32.0–36.0)
MCV: 90.1 fL (ref 79.3–98.0)
MONO ABS: 0.7 10*3/uL (ref 0.1–0.9)
Monocytes Relative: 10 %
NEUTROS ABS: 4.8 10*3/uL (ref 1.5–6.5)
Neutrophils Relative %: 63 %
PLATELETS: 183 10*3/uL (ref 140–400)
RBC: 4.68 MIL/uL (ref 4.20–5.82)
RDW: 14.9 % — ABNORMAL HIGH (ref 11.0–14.6)
WBC: 7.6 10*3/uL (ref 4.0–10.3)

## 2017-07-04 LAB — FERRITIN: FERRITIN: 26 ng/mL (ref 22–316)

## 2017-07-17 ENCOUNTER — Other Ambulatory Visit: Payer: Self-pay | Admitting: Cardiology

## 2017-08-16 ENCOUNTER — Other Ambulatory Visit: Payer: Self-pay | Admitting: Cardiology

## 2017-08-27 ENCOUNTER — Other Ambulatory Visit: Payer: Self-pay | Admitting: Cardiology

## 2017-09-05 ENCOUNTER — Encounter: Payer: Self-pay | Admitting: Cardiology

## 2017-09-05 ENCOUNTER — Ambulatory Visit (INDEPENDENT_AMBULATORY_CARE_PROVIDER_SITE_OTHER): Payer: Medicare Other | Admitting: Cardiology

## 2017-09-05 VITALS — BP 128/72 | HR 83 | Ht 73.0 in | Wt 205.6 lb

## 2017-09-05 DIAGNOSIS — I482 Chronic atrial fibrillation: Secondary | ICD-10-CM | POA: Diagnosis not present

## 2017-09-05 DIAGNOSIS — I10 Essential (primary) hypertension: Secondary | ICD-10-CM

## 2017-09-05 DIAGNOSIS — I350 Nonrheumatic aortic (valve) stenosis: Secondary | ICD-10-CM | POA: Diagnosis not present

## 2017-09-05 DIAGNOSIS — I4821 Permanent atrial fibrillation: Secondary | ICD-10-CM

## 2017-09-05 HISTORY — DX: Nonrheumatic aortic (valve) stenosis: I35.0

## 2017-09-05 NOTE — Patient Instructions (Signed)
Medication Instructions:  Your physician recommends that you continue on your current medications as directed. Please refer to the Current Medication list given to you today.  If you need a refill on your cardiac medications, please contact your pharmacy first.  Labwork: Today for kidney function test   Testing/Procedures: None ordered   Follow-Up: Your physician wants you to follow-up in: 1 year with Dr. [name]. You will receive a reminder letter in the mail two months in advance. If you don't receive a letter, please call our office to schedule the follow-up appointment.  Any Other Special Instructions Will Be Listed Below (If Applicable).   Thank you for choosing [facility]    Rena Magenta Schmiesing, RN  [phone]  If you need a refill on your cardiac medications before your next appointment, please call your pharmacy.   

## 2017-09-05 NOTE — Progress Notes (Signed)
Cardiology Office Note:    Date:  09/05/2017   ID:  Eugene [name], DOB Feb 02, 1936, MRN [id number]  PCP:  [name], MD  Cardiologist:  No primary care provider on file.    Referring MD: [name], MD   Chief Complaint  Patient presents with  . Atrial Fibrillation  . Hypertension    History of Present Illness:    Eugene [name] is a 82 y.o. male with a hx of HTN, permanent atrial fibrillation s/p failed DCCV x2 on chronic anticoagulation.  He is here today for followup and is doing well.  He denies any chest pain or pressure, SOB, DOE, PND, orthopnea, LE edema, dizziness, palpitations or syncope. He is compliant with his meds and is tolerating meds with no SE.    Past Medical History:  Diagnosis Date  . AAA (abdominal aortic aneurysm) (HCC) 06/29/2007   stent graft  . Aortic stenosis 09/05/2017   Mild AS/AR by echo 01/2017.  Marland Kitchen GERD (gastroesophageal reflux disease)   . Hemochromatosis   . Hyperlipidemia   . Hypertension   . Irritable bowel syndrome   . Permanent atrial fibrillation (HCC)   . Stroke [facility])    Transient Ischemic Attack    Past Surgical History:  Procedure Laterality Date  . ABDOMINAL AORTIC ANEURYSM REPAIR    . ENDOVASCULAR STENT INSERTION  07/18/2007   saccular infrarenal aortic aneurysm  . EYE SURGERY Bilateral    Cataract    . IR FLUORO GUIDE CV LINE RIGHT  02/13/2017  . IR FLUORO GUIDE CV LINE RIGHT  02/25/2017  . IR US GUIDE VASC ACCESS RIGHT  02/13/2017  . PICC LINE PLACE PERIPHERAL ([facility] HX)  Oct 21, 2014    Current Medications: No outpatient medications have been marked as taking for the 09/05/17 encounter (Office Visit) with [name], MD.     Allergies:   Chlorhexidine gluconate and Tape   Social History   Socioeconomic History  . Marital status: Married    Spouse name: Not on file  . Number of children: Not on file  . Years of education: Not on file  . Highest education level: Not on file  Occupational History  .  Not on file  Social Needs  . Financial resource strain: Not on file  . Food insecurity:    Worry: Not on file    Inability: Not on file  . Transportation needs:    Medical: Not on file    Non-medical: Not on file  Tobacco Use  . Smoking status: Former Smoker    Types: Cigarettes    Last attempt to quit: 06/18/1999    Years since quitting: 18.2  . Smokeless tobacco: Never Used  Substance and Sexual Activity  . Alcohol use: Yes    Alcohol/week: 0.0 oz    Types: 1 - 2 Standard drinks or equivalent per week  . Drug use: No  . Sexual activity: Not on file  Lifestyle  . Physical activity:    Days per week: Not on file    Minutes per session: Not on file  . Stress: Not on file  Relationships  . Social connections:    Talks on phone: Not on file    Gets together: Not on file    Attends religious service: Not on file    Active member of club or organization: Not on file    Attends meetings of clubs or organizations: Not on file    Relationship status: Not on file  Other  Topics Concern  . Not on file  Social History Narrative  . Not on file     Family History: The patient's family history includes Heart disease in his father and mother; Hypertension in his mother.  ROS:   Please see the history of present illness.    ROS  All other systems reviewed and negative.   EKGs/Labs/Other Studies Reviewed:    The following studies were reviewed today: none  EKG:  EKG is  ordered today.  The ekg ordered today demonstrates atrial fibrillation with CVR at 83bpm and IRBBB  Recent Labs: 01/30/2017: ALT 22; BUN 11.5; Creatinine 1.1; Potassium 3.5; Sodium 135 07/04/2017: Hemoglobin 14.2; Platelets 183   Recent Lipid Panel No results found for: CHOL, TRIG, HDL, CHOLHDL, VLDL, LDLCALC, LDLDIRECT  Physical Exam:    VS:  BP 128/72   Pulse 83   Ht 6\' 1"  (1.854 m)   Wt 205 lb 9.6 oz (93.3 kg)   BMI 27.13 kg/m     Wt Readings from Last 3 Encounters:  09/05/17 205 lb 9.6 oz (93.3 kg)   02/27/17 204 lb 8 oz (92.8 kg)  02/06/17 202 lb 6.4 oz (91.8 kg)     GEN:  Well nourished, well developed in no acute distress HEENT: Normal NECK: No JVD; No carotid bruits LYMPHATICS: No lymphadenopathy CARDIAC: irregualry irregular, no  rubs, gallops.  1/6 SM at RUSB RESPIRATORY:  Clear to auscultation without rales, wheezing or rhonchi  ABDOMEN: Soft, non-tender, non-distended MUSCULOSKELETAL:  No edema; No deformity  SKIN: Warm and dry NEUROLOGIC:  Alert and oriented x 3 PSYCHIATRIC:  Normal affect   ASSESSMENT:    1. Permanent atrial fibrillation (HCC)   2. Benign essential HTN   3. Nonrheumatic aortic valve stenosis   4. Hereditary hemochromatosis (HCC)    PLAN:    In order of problems listed above:  1.  Permanent atrial fibrillation -he is well rate controlled on exam today.  He will continue Eliquis 5 mg twice daily and Lopressor 25 mg twice daily.  I will check a bmet today.    2.  Hypertension -blood pressure is well controlled on exam today.  He will continue on ramipril 5 mg daily, HCTZ 25 mg daily, Lopressor 25 mg twice daily, amlodipine 10 mg daily.  Creatinine was stable at 1.1 on 01/30/2017.  3.  Mild AS/AR with possible bicuspid AV by echo 01/2017.  He is asymptomatic.   4.  Hemachromatosis - sees Dr. Darnelle CatalanMagrinat for phlebotomy.  Echo showed moderate LVH.  Likely related to his iron storage disease.  Will continue to follow echo to make sure he does not developed DCM.  Medication Adjustments/Labs and Tests Ordered: Current medicines are reviewed at length with the patient today.  Concerns regarding medicines are outlined above.  Orders Placed This Encounter  Procedures  . EKG 12-Lead   No orders of the defined types were placed in this encounter.   Signed, Armanda Magicraci Stanlee Roehrig, MD  09/05/2017 3:37 PM    [facility]

## 2017-09-05 NOTE — Addendum Note (Signed)
Addended by: Phineas SemenOBERTSON, Lizbet Cirrincione on: 09/05/2017 03:43 PM   Modules accepted: Orders

## 2017-09-06 LAB — BASIC METABOLIC PANEL
BUN / CREAT RATIO: 12 (ref 10–24)
BUN: 13 mg/dL (ref 8–27)
CO2: 25 mmol/L (ref 20–29)
CREATININE: 1.11 mg/dL (ref 0.76–1.27)
Calcium: 9.8 mg/dL (ref 8.6–10.2)
Chloride: 94 mmol/L — ABNORMAL LOW (ref 96–106)
GFR calc Af Amer: 72 mL/min/{1.73_m2} (ref >59)
GFR, EST NON AFRICAN AMERICAN: 62 mL/min/{1.73_m2} (ref >59)
GLUCOSE: 79 mg/dL (ref 65–99)
Potassium: 3.7 mmol/L (ref 3.5–5.2)
Sodium: 136 mmol/L (ref 134–144)

## 2017-09-14 ENCOUNTER — Other Ambulatory Visit: Payer: Self-pay | Admitting: Cardiology

## 2017-09-21 ENCOUNTER — Other Ambulatory Visit: Payer: Self-pay | Admitting: Cardiology

## 2017-09-23 NOTE — Telephone Encounter (Signed)
Age 82 years Wt 93.3 kg 09/05/2017 Saw Dr Mayford Knife 09/05/2017 09/05/2017 SrCr 1.11 07/04/2017 Hgb 14.2 HCT 42.2 Refill done for Eliquis  q 12 hours as requested

## 2017-09-26 ENCOUNTER — Inpatient Hospital Stay: Payer: Medicare Other | Attending: Oncology

## 2017-09-26 LAB — CBC WITH DIFFERENTIAL/PLATELET
BASOS ABS: 0 10*3/uL (ref 0.0–0.1)
Basophils Relative: 0 %
EOS ABS: 0.3 10*3/uL (ref 0.0–0.5)
EOS PCT: 4 %
HCT: 40.1 % (ref 38.4–49.9)
HEMOGLOBIN: 14.4 g/dL (ref 13.0–17.1)
Lymphocytes Relative: 21 %
Lymphs Abs: 1.4 10*3/uL (ref 0.9–3.3)
MCH: 33.3 pg (ref 27.2–33.4)
MCHC: 35.9 g/dL (ref 32.0–36.0)
MCV: 92.6 fL (ref 79.3–98.0)
Monocytes Absolute: 0.6 10*3/uL (ref 0.1–0.9)
Monocytes Relative: 9 %
NEUTROS PCT: 66 %
Neutro Abs: 4.3 10*3/uL (ref 1.5–6.5)
PLATELETS: 172 10*3/uL (ref 140–400)
RBC: 4.33 MIL/uL (ref 4.20–5.82)
RDW: 13.8 % (ref 11.0–14.6)
WBC: 6.5 10*3/uL (ref 4.0–10.3)

## 2017-09-26 LAB — FERRITIN: FERRITIN: 63 ng/mL (ref 22–316)

## 2017-10-01 ENCOUNTER — Other Ambulatory Visit: Payer: Medicare Other

## 2017-11-11 ENCOUNTER — Encounter: Payer: Self-pay | Admitting: Surgery

## 2017-11-11 ENCOUNTER — Other Ambulatory Visit: Payer: Self-pay

## 2017-11-11 ENCOUNTER — Ambulatory Visit (INDEPENDENT_AMBULATORY_CARE_PROVIDER_SITE_OTHER): Payer: Medicare Other | Admitting: Surgery

## 2017-11-11 ENCOUNTER — Ambulatory Visit (HOSPITAL_COMMUNITY)
Admission: RE | Admit: 2017-11-11 | Discharge: 2017-11-11 | Disposition: A | Discharge status: Home / Self Care | Payer: Medicare Other | Source: Ambulatory Visit | Attending: Surgery | Admitting: Surgery

## 2017-11-11 VITALS — BP 111/75 | HR 62 | Temp 97.0°F | Resp 16 | Ht 73.0 in | Wt 207.0 lb

## 2017-11-11 DIAGNOSIS — I714 Abdominal aortic aneurysm, without rupture, unspecified: Secondary | ICD-10-CM

## 2017-11-11 DIAGNOSIS — Z95828 Presence of other vascular implants and grafts: Secondary | ICD-10-CM | POA: Diagnosis not present

## 2017-11-11 DIAGNOSIS — Z87891 Personal history of nicotine dependence: Secondary | ICD-10-CM | POA: Diagnosis present

## 2017-11-11 NOTE — Progress Notes (Signed)
[facility] [place]  Patient name: Eugene [name] MRN: [id number] DOB: 11/03/1935 Sex: male   REASON FOR VISIT:    Follow up  HISOTRY OF PRESENT ILLNESS:   The patient is back today for followup. He is a former patient of Dr. Madilyn Fireman. He is status post endovascular aneurysm repair on 06/29/2007 using an Endologix device. He is back today for followup.  At his last visit, there was concern that the aneurysm sac had increased in size.  He is back today for follow-up.  He has no complaints.  He has no abdominal pain or back pain.  He denies any neurologic symptoms.  PAST MEDICAL HISTORY:   Past Medical History:  Diagnosis Date  . AAA (abdominal aortic aneurysm) (HCC) 06/29/2007   stent graft  . Aortic stenosis 09/05/2017   Mild AS/AR by echo 01/2017.  Marland Kitchen GERD (gastroesophageal reflux disease)   . Hemochromatosis   . Hyperlipidemia   . Hypertension   . Irritable bowel syndrome   . Permanent atrial fibrillation (HCC)   . Stroke [facility])    Transient Ischemic Attack     FAMILY HISTORY:   Family History  Problem Relation Age of Onset  . Hypertension Mother   . Heart disease Mother        AAA, he passed in his 39 s of Heart Diseasse  . Heart disease Father        AAA history and he passed at age 52  of   Disease    SOCIAL HISTORY:   Social History   Tobacco Use  . Smoking status: Former Smoker    Types: Cigarettes    Last attempt to quit: 06/18/1999    Years since quitting: 18.4  . Smokeless tobacco: Never Used  Substance Use Topics  . Alcohol use: Yes    Alcohol/week: 0.6 - 1.2 oz    Types: 1 - 2 Standard drinks or equivalent per week     ALLERGIES:   Allergies  Allergen Reactions  . Chlorhexidine Gluconate Itching    Don't use Chloraprep inside port a cath kits.  . Tape     Adhesive tape,   Only use paper tape     CURRENT MEDICATIONS:   Current Outpatient Medications  Medication Sig Dispense  Refill  . ALPRAZolam (XANAX) 0.5 MG tablet Take 0.5 mg by mouth as needed. For anxiety    . amLODipine (NORVASC) 10 MG tablet PLEASE SEE ATTACHED FOR DETAILED DIRECTIONS 15 tablet 0  . amLODipine (NORVASC) 10 MG tablet TAKE 1 TABLET BY MOUTH EVERY DAY 90 tablet 3  . Ascorbic Acid (VITAMIN C) 100 MG tablet Take 100 mg by mouth.     Marland Kitchen atorvastatin (LIPITOR) 20 MG tablet Take 20 mg by mouth daily.     Marland Kitchen ELIQUIS 5 MG TABS tablet TAKE 1 TABLET BY MOUTH TWICE A DAY 60 tablet 5  . GAVILYTE-G 236 g solution See admin instructions.  0  . hydrochlorothiazide (HYDRODIURIL) 25 MG tablet Take 25 mg by mouth daily.    . hyoscyamine (LEVBID) 0.375 MG 12 hr tablet Take 0.375 mg by mouth as needed.     Marland Kitchen LORazepam (ATIVAN) 0.5 MG tablet Take 0.5 mg by mouth at bedtime.     . metoprolol tartrate (LOPRESSOR) 25 MG tablet Take 25 mg by mouth 2 (two) times daily.     . potassium chloride SA (KLOR-CON M20) 20 MEQ tablet Take 1 tablet (20 mEq total) by mouth daily. Please make overdue yearly  appt with Dr. Mayford Knife. 1st attempt (Patient taking differently: Take 20 mEq by mouth daily. ) 90 tablet 3  . ramipril (ALTACE) 5 MG capsule Take 5 mg by mouth daily.     Marland Kitchen thiamine (VITAMIN B-1) 100 MG tablet Take 250 mg by mouth daily.    . vitamin E 100 UNIT capsule Take 100 Units by mouth daily.    Marland Kitchen zolpidem (AMBIEN) 5 MG tablet Take 1 tablet (5 mg total) by mouth at bedtime as needed for sleep. May take 2 tabs ( 10mg  ) if needed. Call office so new script can be called in. 30 tablet 1   No current facility-administered medications for this visit.    Facility-Administered Medications Ordered in Other Visits  Medication Dose Route Frequency Provider Last Rate Last Dose  . alteplase (CATHFLO ACTIVASE) injection 2 mg  2 mg Intracatheter Once PRN Magrinat, Valentino Hue, MD      . sodium chloride 0.9 % injection 10 mL  10 mL Intravenous PRN Magrinat, Valentino Hue, MD   10 mL at 11/25/14 1220    REVIEW OF SYSTEMS:   [X]  denotes  positive finding, [ ]  denotes negative finding Cardiac  Comments:  Chest pain or chest pressure:    Shortness of breath upon exertion:    Short of breath when lying flat:    Irregular heart rhythm:        Vascular    Pain in calf, thigh, or hip brought on by ambulation:    Pain in feet at night that wakes you up from your sleep:     Blood clot in your veins:    Leg swelling:         Pulmonary    Oxygen at home:    Productive cough:     Wheezing:         Neurologic    Sudden weakness in arms or legs:     Sudden numbness in arms or legs:     Sudden onset of difficulty speaking or slurred speech:    Temporary loss of vision in one eye:     Problems with dizziness:         Gastrointestinal    Blood in stool:     Vomited blood:         Genitourinary    Burning when urinating:     Blood in urine:        Psychiatric    Major depression:         Hematologic    Bleeding problems:    Problems with blood clotting too easily:        Skin    Rashes or ulcers:        Constitutional    Fever or chills:      PHYSICAL EXAM:   Vitals:   11/11/17 0936  BP: 111/75  Pulse: 62  Resp: 16  Temp: (!) 97 F (36.1 C)  TempSrc: Oral  SpO2: 98%  Weight: 207 lb (93.9 kg)  Height: 6\' 1"  (1.854 m)    GENERAL: The patient is a well-nourished male, in no acute distress. The vital signs are documented above. CARDIAC: There is a regular rate and rhythm.  VASCULAR: No carotid bruits.  PULMONARY: Non-labored respirations ABDOMEN: Soft and non-tender.  No pulsatile mass MUSCULOSKELETAL: There are no major deformities or cyanosis. NEUROLOGIC: No focal weakness or paresthesias are detected. SKIN: There are no ulcers or rashes noted. PSYCHIATRIC: The patient has a normal affect.  STUDIES:  I have ordered and reviewed his ultrasound study.  This shows the largely aortic diameter to be 2.6 cm.  There is no evidence of endoleak.  MEDICAL ISSUES:   AAA: No evidence of endoleak today  with a decrease in the size of his aortic diameter.  He will have repeat surveillance imaging in 1 year  Carotid duplex: I will check carotid studies when he returns in 1 year.    Durene CalWells Sander Remedios, MD [facility] [place] [place] Tel [phone] Pager [phone]

## 2018-01-01 ENCOUNTER — Inpatient Hospital Stay: Payer: Medicare Other

## 2018-01-28 ENCOUNTER — Telehealth: Payer: Self-pay | Admitting: Oncology

## 2018-01-28 NOTE — Telephone Encounter (Signed)
GM PAL - per GM move 9/12 f/u to 9/11 @ 2 pm or November. Slot on 9/11 already taken by GM for another patient. Moved f/u to 11/29 and left message confirming 9/5 lab and asking that patient call me directly re new date/time for f/u. Also added comment to 9/5 lab appointment to send patient to scheduling re new f/u date/time.

## 2018-01-30 ENCOUNTER — Encounter: Payer: Self-pay | Admitting: Oncology

## 2018-01-30 ENCOUNTER — Inpatient Hospital Stay: Payer: Medicare Other | Attending: Oncology

## 2018-01-30 LAB — CBC WITH DIFFERENTIAL/PLATELET
BASOS PCT: 1 %
Basophils Absolute: 0.1 10*3/uL (ref 0.0–0.1)
Eosinophils Absolute: 0.2 10*3/uL (ref 0.0–0.5)
Eosinophils Relative: 3 %
HEMATOCRIT: 43.3 % (ref 38.4–49.9)
Hemoglobin: 14.9 g/dL (ref 13.0–17.1)
LYMPHS PCT: 22 %
Lymphs Abs: 1.4 10*3/uL (ref 0.9–3.3)
MCH: 33.2 pg (ref 27.2–33.4)
MCHC: 34.5 g/dL (ref 32.0–36.0)
MCV: 96.1 fL (ref 79.3–98.0)
MONO ABS: 0.7 10*3/uL (ref 0.1–0.9)
MONOS PCT: 11 %
NEUTROS ABS: 4.1 10*3/uL (ref 1.5–6.5)
Neutrophils Relative %: 63 %
Platelets: 208 10*3/uL (ref 140–400)
RBC: 4.51 MIL/uL (ref 4.20–5.82)
RDW: 13.5 % (ref 11.0–14.6)
WBC: 6.5 10*3/uL (ref 4.0–10.3)

## 2018-01-30 LAB — FERRITIN: Ferritin: 128 ng/mL (ref 24–336)

## 2018-01-30 NOTE — Telephone Encounter (Signed)
Returned call to patient to confirm cancellation of 9/12 follow up and confirm next appointments for lab 11/7 and GM 11/26. See previous notes. Also f/u will be 11/26 not 11/29. Schedule mailed.

## 2018-01-31 NOTE — Telephone Encounter (Signed)
Patient left message regarding receiving my message about his new appointments. The message below was sent to GM/desk nurse regarding patient concern about November appointments.    Hi Val/Dr. Darnelle Catalan,  Eugene [name] is one of Dr. Darrall Dears patients that was rescheduled and moved to November due to [facility].   Eugene [name] called regarding the appointments and states that he saw Dr. Darnelle Catalan in the parking lot yesterday after lab and was told he might not need f/u after labs are reviewed. Also he is concerned about the lab for 11/7 that was already on schedule. He says his insurance company may not pay for him to have another lab draw in two months.   Please advise.   Left message for patient letting him know he will here back from me once I have a response from GM.

## 2018-02-04 NOTE — Telephone Encounter (Signed)
Per staff message response from desk nurse/GM patient does not need to return for lab and f/u until December. Cancelled November lab and f/u and moved appointments to 12/5 for lab and 12/12 for GM. Spoke with patient he is aware.

## 2018-02-06 ENCOUNTER — Ambulatory Visit: Payer: Medicare Other | Admitting: Oncology

## 2018-03-22 ENCOUNTER — Other Ambulatory Visit: Payer: Self-pay | Admitting: Cardiology

## 2018-03-24 NOTE — Telephone Encounter (Signed)
Pt last saw Dr Mayford Knife 09/05/17, last labs 09/05/17 Creat 1.11, age 82, weight 93.9kg, based on specified criteria pt is on appropriate dosage of Eliquis 5mg  BID.  Will refill rx.

## 2018-04-03 ENCOUNTER — Other Ambulatory Visit: Payer: Medicare Other

## 2018-04-22 ENCOUNTER — Ambulatory Visit: Payer: Medicare Other | Admitting: Oncology

## 2018-05-01 ENCOUNTER — Inpatient Hospital Stay: Payer: Medicare Other | Attending: Oncology

## 2018-05-01 DIAGNOSIS — Z79899 Other long term (current) drug therapy: Secondary | ICD-10-CM | POA: Diagnosis not present

## 2018-05-01 DIAGNOSIS — Z87891 Personal history of nicotine dependence: Secondary | ICD-10-CM | POA: Diagnosis not present

## 2018-05-01 DIAGNOSIS — I4821 Permanent atrial fibrillation: Secondary | ICD-10-CM | POA: Insufficient documentation

## 2018-05-01 DIAGNOSIS — Z7901 Long term (current) use of anticoagulants: Secondary | ICD-10-CM | POA: Insufficient documentation

## 2018-05-01 DIAGNOSIS — I10 Essential (primary) hypertension: Secondary | ICD-10-CM | POA: Diagnosis not present

## 2018-05-01 LAB — CBC WITH DIFFERENTIAL/PLATELET
ABS IMMATURE GRANULOCYTES: 0.03 10*3/uL (ref 0.00–0.07)
BASOS ABS: 0 10*3/uL (ref 0.0–0.1)
Basophils Relative: 0 %
EOS PCT: 2 %
Eosinophils Absolute: 0.2 10*3/uL (ref 0.0–0.5)
HCT: 43 % (ref 39.0–52.0)
HEMOGLOBIN: 15 g/dL (ref 13.0–17.0)
IMMATURE GRANULOCYTES: 0 %
LYMPHS PCT: 22 %
Lymphs Abs: 1.6 10*3/uL (ref 0.7–4.0)
MCH: 33.7 pg (ref 26.0–34.0)
MCHC: 34.9 g/dL (ref 30.0–36.0)
MCV: 96.6 fL (ref 80.0–100.0)
Monocytes Absolute: 0.7 10*3/uL (ref 0.1–1.0)
Monocytes Relative: 10 %
NEUTROS ABS: 4.7 10*3/uL (ref 1.7–7.7)
NEUTROS PCT: 66 %
NRBC: 0 % (ref 0.0–0.2)
Platelets: 199 10*3/uL (ref 150–400)
RBC: 4.45 MIL/uL (ref 4.22–5.81)
RDW: 12.8 % (ref 11.5–15.5)
WBC: 7.2 10*3/uL (ref 4.0–10.5)

## 2018-05-01 LAB — FERRITIN: Ferritin: 133 ng/mL (ref 24–336)

## 2018-05-07 NOTE — Progress Notes (Signed)
[facility]  [phone]) [phone] [phone]    ID: Eugene [name]   DOB: Mar 16, 1936  MR#: [id number]  JYN#:[id number]  Patient Care Team: Eugene Housekeeper, MD as PCP - General (Internal Medicine) Eugene [name], Eugene Hue, MD (Hematology and Oncology) Eugene Reichert, MD as Attending Physician (Cardiology) Eugene Creek, MD as Consulting Physician (Ophthalmology) Eugene Elizabeth, MD as Consulting Physician (Gastroenterology) Eugene Libman, MD as Consulting Physician (Vascular Surgery) OTHER MD:   INTERVAL HISTORY: Eugene [name] returns today for follow-up of his hemochromatosis.   The patient continues under observation. His diet has not changed, but he actively tries to avoid iron rich foods.   His serum iron was rising steadily but then it slowed down at the most recent reading, as noted below    REVIEW OF SYSTEMS: Eugene [name] is doing well overall. For the holidays, he was going to go to see his brother-in-law, but he is currently in the hospital. So, they will be delaying their visit until January. For exercise, he walks and completes leg exercises every other day. The patient denies unusual headaches, visual changes, nausea, vomiting, or dizziness. There has been no unusual cough, phlegm production, or pleurisy. This been no change in bowel or bladder habits. The patient denies unexplained fatigue or unexplained weight loss, bleeding, rash, or fever. A detailed review of systems was otherwise noncontributory.    HISTORY OF PRESENT ILLNESS: Eugene [name] hemochromatosis was documented by labs and genetics in 1989. His family (sister, children) have been tested and all are negative for the mutation   PAST MEDICAL HISTORY: Past Medical History:  Diagnosis Date  . AAA (abdominal aortic aneurysm) (HCC) 06/29/2007   stent graft  . Aortic stenosis 09/05/2017   Mild AS/AR by echo 01/2017.  Marland Kitchen GERD (gastroesophageal reflux disease)   . Hemochromatosis   . Hyperlipidemia   . Hypertension    . Irritable bowel syndrome   . Permanent atrial fibrillation (HCC)   . Stroke (HCC)    Transient Ischemic Attack    PAST SURGICAL HISTORY: Past Surgical History:  Procedure Laterality Date  . ABDOMINAL AORTIC ANEURYSM REPAIR    . ENDOVASCULAR STENT INSERTION  07/18/2007   saccular infrarenal aortic aneurysm  . EYE SURGERY Bilateral    Cataract    . IR FLUORO GUIDE CV LINE RIGHT  02/13/2017  . IR FLUORO GUIDE CV LINE RIGHT  02/25/2017  . IR US GUIDE VASC ACCESS RIGHT  02/13/2017  . PICC LINE PLACE PERIPHERAL ([facility] HX)  Oct 21, 2014    FAMILY HISTORY Family History  Problem Relation Age of Onset  . Hypertension Mother   . Heart disease Mother        AAA, he passed in his 10 s of Heart Diseasse  . Heart disease Father        AAA history and he passed at age 50  of   Disease   The patient's father died from a myocardial infarction at age 16. The patient's mother died from lung problems at age 23. He had no brothers, one sister. There is no history of hemochromatosis in the family to his knowledge.   SOCIAL HISTORY: Eugene [name] has 2 children from his first marriage, Eugene [name], who is a Runner, broadcasting/film/video in [place], with one adopted child, and Eugene [name], who works in Engineer, materials, and has one biologic and one adopted child. Eugene [name] second wife, Eugene [name], who was my patient, had 2 children from her prior marriage, and Eugene [name] current wife, Eugene [name], also has 2 children  of her own. Eugene [name] and Eugene [name] married in 2007. The patient attends a [employer].    ADVANCED DIRECTIVES: not in place   HEALTH MAINTENANCE: Social History   Tobacco Use  . Smoking status: Former Smoker    Types: Cigarettes    Last attempt to quit: 06/18/1999    Years since quitting: 18.9  . Smokeless tobacco: Never Used  Substance Use Topics  . Alcohol use: Yes    Alcohol/week: 1.0 - 2.0 standard drinks    Types: 1 - 2 Standard drinks or equivalent per week  . Drug use: No    Allergies  Allergen  Reactions  . Chlorhexidine Gluconate Itching    Don't use Chloraprep inside port a cath kits.  . Tape     Adhesive tape,   Only use paper tape    Current Outpatient Medications  Medication Sig Dispense Refill  . ALPRAZolam (XANAX) 0.5 MG tablet Take 0.5 mg by mouth as needed. For anxiety    . amLODipine (NORVASC) 10 MG tablet TAKE 1 TABLET BY MOUTH EVERY DAY 90 tablet 3  . Ascorbic Acid (VITAMIN C) 100 MG tablet Take 100 mg by mouth.     Marland Kitchen. atorvastatin (LIPITOR) 20 MG tablet Take 20 mg by mouth daily.     Marland Kitchen. ELIQUIS 5 MG TABS tablet TAKE 1 TABLET BY MOUTH TWICE A DAY 60 tablet 6  . GAVILYTE-G 236 g solution See admin instructions.  0  . hydrochlorothiazide (HYDRODIURIL) 25 MG tablet Take 25 mg by mouth daily.    . hyoscyamine (LEVBID) 0.375 MG 12 hr tablet Take 0.375 mg by mouth as needed.     Marland Kitchen. LORazepam (ATIVAN) 0.5 MG tablet Take 0.5 mg by mouth at bedtime.     . metoprolol tartrate (LOPRESSOR) 25 MG tablet Take 25 mg by mouth 2 (two) times daily.     . potassium chloride SA (KLOR-CON M20) 20 MEQ tablet Take 1 tablet (20 mEq total) by mouth daily. Please make overdue yearly appt with Eugene [name]. 1st attempt (Patient taking differently: Take 20 mEq by mouth daily. ) 90 tablet 3  . ramipril (ALTACE) 5 MG capsule Take 5 mg by mouth daily.     Marland Kitchen. thiamine (VITAMIN B-1) 100 MG tablet Take 250 mg by mouth daily.    . vitamin E 100 UNIT capsule Take 100 Units by mouth daily.    Marland Kitchen. zolpidem (AMBIEN) 5 MG tablet Take 1 tablet (5 mg total) by mouth at bedtime as needed for sleep. May take 2 tabs ( 10mg  ) if needed. Call office so new script can be called in. 30 tablet 1   No current facility-administered medications for this visit.    Facility-Administered Medications Ordered in Other Visits  Medication Dose Route Frequency Provider Last Rate Last Dose  . alteplase (CATHFLO ACTIVASE) injection 2 mg  2 mg Intracatheter Once PRN Colon Rueth, Eugene HueGustav C, MD      . sodium chloride 0.9 % injection 10 mL   10 mL Intravenous PRN Elmer Boutelle, Eugene HueGustav C, MD   10 mL at 11/25/14 1220    OBJECTIVE: Middle-aged white man who appears well Vitals:   05/08/18 1130  BP: 133/79  Pulse: 87  Resp: 18  Temp: (!) 97.4 F (36.3 C)  SpO2: 100%     Body mass index is 27.55 kg/m.    ECOG FS: 1  Sclerae unicteric, EOMs intact No cervical or supraclavicular adenopathy Lungs no rales or rhonchi Heart regular rate and rhythm Abd soft, nontender, positive  bowel sounds MSK no focal spinal tenderness Neuro: nonfocal, well oriented, appropriate affect      LAB RESULTS: Results for CALDER, OBLINGER (MRN [id number]) as of 05/08/2018 11:37  Ref. Range 07/04/2017 13:13 09/26/2017 13:03 01/30/2018 11:54 05/01/2018 10:40  Ferritin Latest Ref Range: 24 - 336 ng/mL 26 63 128 133    Lab Results  Component Value Date   WBC 7.2 05/01/2018   NEUTROABS 4.7 05/01/2018   HGB 15.0 05/01/2018   HCT 43.0 05/01/2018   MCV 96.6 05/01/2018   PLT 199 05/01/2018      Chemistry      Component Value Date/Time   NA 136 09/05/2017 1545   NA 135 (L) 01/30/2017 1145   K 3.7 09/05/2017 1545   K 3.5 01/30/2017 1145   CL 94 (L) 09/05/2017 1545   CO2 25 09/05/2017 1545   CO2 27 01/30/2017 1145   BUN 13 09/05/2017 1545   BUN 11.5 01/30/2017 1145   CREATININE 1.11 09/05/2017 1545   CREATININE 1.1 01/30/2017 1145      Component Value Date/Time   CALCIUM 9.8 09/05/2017 1545   CALCIUM 10.4 01/30/2017 1145   ALKPHOS 57 01/30/2017 1145   AST 22 01/30/2017 1145   ALT 22 01/30/2017 1145   BILITOT 1.59 (H) 01/30/2017 1145       No results found for: LABCA2  No components found for: LABCA125  No results for input(s): INR in the last 168 hours.  Urinalysis    Component Value Date/Time   COLORURINE YELLOW 07/15/2007 1041   APPEARANCEUR CLEAR 07/15/2007 1041   LABSPEC 1.007 07/15/2007 1041   PHURINE 7.5 07/15/2007 1041   GLUCOSEU NEGATIVE 07/15/2007 1041   HGBUR TRACE (A) 07/15/2007 1041   BILIRUBINUR NEGATIVE  07/15/2007 1041   KETONESUR NEGATIVE 07/15/2007 1041   PROTEINUR NEGATIVE 07/15/2007 1041   UROBILINOGEN 0.2 07/15/2007 1041   NITRITE NEGATIVE 07/15/2007 1041   LEUKOCYTESUR NEGATIVE 07/15/2007 1041    STUDIES: Ferritin results discussed with the patient  ASSESSMENT: 82 y.o. Eugene [name] man with a history of hemochromatosis diagnosed in 1989, treated with intermittent phlebotomies.   (1) also history of abdominal aortic aneurysm, status post stent placement  (2) history of irritable bowel syndrome, intermittently symptomatic  (3) atrial fibrillation, on apixaban  (4) poor access, requiring PICC during phlebotomies  PLAN:  Eugene [name]' s Ilda Mori was rising pretty steadily and then it slowed down in the last month.  He is on apixaban, which of course can cause occult bleeding and may be he is losing a little bit of iron somewhere in his GI tract, with no clinical symptoms associated with that.  At any rate he is not ready to start phlebotomy.  We are going to recheck his ferritin late January.  I am going to wait until he is well over 150 before starting since it is so difficult to access him and he will need a PICC line whenever we do start phlebotomies  Otherwise he is doing very well.  We will continue to check labs every 3 months and he will see me again in 1 year  He knows to call for any other issues that may develop before the next visit.   Yazeed Pryer, Eugene Hue, MD  05/08/18 11:48 AM Medical Oncology and Hematology [facility] [address] [place], [place] [address] Tel. [phone]    Fax. [phone]    I, [employer] am acting as a Neurosurgeon for Lowella Dell, MD.   I, Ruthann Cancer MD,  have reviewed the above documentation for accuracy and completeness, and I agree with the above.

## 2018-05-08 ENCOUNTER — Telehealth: Payer: Self-pay | Admitting: Oncology

## 2018-05-08 ENCOUNTER — Inpatient Hospital Stay (HOSPITAL_BASED_OUTPATIENT_CLINIC_OR_DEPARTMENT_OTHER): Payer: Medicare Other | Admitting: Oncology

## 2018-05-08 DIAGNOSIS — Z87891 Personal history of nicotine dependence: Secondary | ICD-10-CM

## 2018-05-08 DIAGNOSIS — I10 Essential (primary) hypertension: Secondary | ICD-10-CM

## 2018-05-08 DIAGNOSIS — Z7901 Long term (current) use of anticoagulants: Secondary | ICD-10-CM | POA: Diagnosis not present

## 2018-05-08 DIAGNOSIS — I4821 Permanent atrial fibrillation: Secondary | ICD-10-CM

## 2018-05-08 DIAGNOSIS — Z79899 Other long term (current) drug therapy: Secondary | ICD-10-CM

## 2018-05-08 NOTE — Telephone Encounter (Signed)
Gave patient avs and calendar.   °

## 2018-06-03 ENCOUNTER — Telehealth: Payer: Self-pay | Admitting: Cardiology

## 2018-06-03 NOTE — Telephone Encounter (Signed)
New Message   STAT if patient feels like he/she is going to faint   1) Are you dizzy now? no  2) Do you feel faint or have you passed out? no  3) Do you have any other symptoms? States his eyes don't focus and when he is standing he becomes off blalanced for about 15-20 secs with blurred vision and then it goes away  4) Have you checked your HR and BP (record if available)? no

## 2018-06-03 NOTE — Telephone Encounter (Signed)
Spoke with the patient, for several months the patient stated that he will be out walking around and his vision will blur and he will feel uneasy and slightly off balance. He stated if he stops and waits about 10 seconds his vision returns to normal and he does not experience anymore symptoms. He state this used to occur every month or so, but now the frequency has slightly worsened to every couple of weeks. The patient has not had any recent medications changes.

## 2018-06-03 NOTE — Telephone Encounter (Signed)
Attempted to cal, no voicemail option. Will call later.

## 2018-06-03 NOTE — Telephone Encounter (Signed)
Please get an event monitor and he also needs to see his PCP

## 2018-06-04 NOTE — Telephone Encounter (Signed)
Spoke with the patient, he is going to call PCP to discuss further. He declined the event monitor at this time, but will let us know what the PCP says.

## 2018-06-06 ENCOUNTER — Telehealth: Payer: Self-pay | Admitting: Cardiology

## 2018-06-06 NOTE — Telephone Encounter (Signed)
Left message for patient to call back  

## 2018-06-06 NOTE — Telephone Encounter (Signed)
Patient called back. Patient stated he spoke with his PCP and he thinks this is more neurological then cardiac. Patient stated he has an appointment with his PCP early next week. Patient will follow up with  PCP. Patient just wanted to let Dr. Norris Crossurner's nurse know, will send message to her nurse.

## 2018-06-06 NOTE — Telephone Encounter (Signed)
New message   Patient has additional questions about the topic that was discussed per the previous message on file. Please call.

## 2018-06-11 ENCOUNTER — Telehealth: Payer: Self-pay | Admitting: *Deleted

## 2018-06-11 NOTE — Telephone Encounter (Signed)
This RN returned call per pt's VM inquiring about " do I continue on the same potassium dose "

## 2018-06-26 ENCOUNTER — Inpatient Hospital Stay: Payer: Medicare Other | Attending: Oncology

## 2018-06-26 DIAGNOSIS — Z87891 Personal history of nicotine dependence: Secondary | ICD-10-CM | POA: Diagnosis not present

## 2018-06-26 DIAGNOSIS — Z7901 Long term (current) use of anticoagulants: Secondary | ICD-10-CM | POA: Diagnosis not present

## 2018-06-26 DIAGNOSIS — I4821 Permanent atrial fibrillation: Secondary | ICD-10-CM | POA: Insufficient documentation

## 2018-06-26 DIAGNOSIS — I10 Essential (primary) hypertension: Secondary | ICD-10-CM | POA: Diagnosis not present

## 2018-06-26 DIAGNOSIS — Z79899 Other long term (current) drug therapy: Secondary | ICD-10-CM | POA: Insufficient documentation

## 2018-06-26 LAB — CBC WITH DIFFERENTIAL/PLATELET
ABS IMMATURE GRANULOCYTES: 0.03 10*3/uL (ref 0.00–0.07)
BASOS ABS: 0 10*3/uL (ref 0.0–0.1)
Basophils Relative: 1 %
Eosinophils Absolute: 0.2 10*3/uL (ref 0.0–0.5)
Eosinophils Relative: 4 %
HCT: 41.7 % (ref 39.0–52.0)
Hemoglobin: 14.6 g/dL (ref 13.0–17.0)
IMMATURE GRANULOCYTES: 1 %
Lymphocytes Relative: 22 %
Lymphs Abs: 1.4 10*3/uL (ref 0.7–4.0)
MCH: 33.8 pg (ref 26.0–34.0)
MCHC: 35 g/dL (ref 30.0–36.0)
MCV: 96.5 fL (ref 80.0–100.0)
Monocytes Absolute: 0.5 10*3/uL (ref 0.1–1.0)
Monocytes Relative: 8 %
NEUTROS PCT: 64 %
NRBC: 0 % (ref 0.0–0.2)
Neutro Abs: 4.3 10*3/uL (ref 1.7–7.7)
Platelets: 183 10*3/uL (ref 150–400)
RBC: 4.32 MIL/uL (ref 4.22–5.81)
RDW: 12.9 % (ref 11.5–15.5)
WBC: 6.6 10*3/uL (ref 4.0–10.5)

## 2018-06-26 LAB — FERRITIN: Ferritin: 145 ng/mL (ref 24–336)

## 2018-06-26 LAB — COMPREHENSIVE METABOLIC PANEL
ALK PHOS: 53 U/L (ref 38–126)
ALT: 17 U/L (ref 0–44)
ANION GAP: 9 (ref 5–15)
AST: 17 U/L (ref 15–41)
Albumin: 4 g/dL (ref 3.5–5.0)
BILIRUBIN TOTAL: 1.5 mg/dL — AB (ref 0.3–1.2)
BUN: 13 mg/dL (ref 8–23)
CALCIUM: 9.6 mg/dL (ref 8.9–10.3)
CO2: 29 mmol/L (ref 22–32)
Chloride: 99 mmol/L (ref 98–111)
Creatinine, Ser: 1.29 mg/dL — ABNORMAL HIGH (ref 0.61–1.24)
GFR calc Af Amer: >60 mL/min (ref >60)
GFR calc non Af Amer: 52 mL/min — ABNORMAL LOW (ref >60)
Glucose, Bld: 116 mg/dL — ABNORMAL HIGH (ref 70–99)
POTASSIUM: 3.6 mmol/L (ref 3.5–5.1)
Sodium: 137 mmol/L (ref 135–145)
TOTAL PROTEIN: 6.7 g/dL (ref 6.5–8.1)

## 2018-06-30 ENCOUNTER — Telehealth: Payer: Self-pay

## 2018-06-30 NOTE — Telephone Encounter (Signed)
Returned TC to pt requesting to know his ferritin level from last thursdays visit. Let him know that his Ferritin level was 145. Pt verbalized understanding. No further problems or concerns at this time.Marland Kitchen

## 2018-07-05 ENCOUNTER — Other Ambulatory Visit: Payer: Self-pay | Admitting: Cardiology

## 2018-07-08 ENCOUNTER — Other Ambulatory Visit: Payer: Self-pay | Admitting: Cardiology

## 2018-07-08 MED ORDER — POTASSIUM CHLORIDE CRYS ER 20 MEQ PO TBCR
20.0000 meq | EXTENDED_RELEASE_TABLET | Freq: Every day | ORAL | 0 refills | Status: DC
Start: 2018-07-08 — End: 2018-12-24

## 2018-08-11 ENCOUNTER — Ambulatory Visit
Admission: RE | Admit: 2018-08-11 | Discharge: 2018-08-11 | Disposition: A | Discharge status: Home / Self Care | Payer: Medicare Other | Source: Ambulatory Visit | Attending: Internal Medicine | Admitting: Internal Medicine

## 2018-08-11 ENCOUNTER — Other Ambulatory Visit: Payer: Self-pay | Admitting: Internal Medicine

## 2018-08-11 DIAGNOSIS — R059 Cough, unspecified: Secondary | ICD-10-CM

## 2018-08-11 DIAGNOSIS — R05 Cough: Secondary | ICD-10-CM

## 2018-09-07 ENCOUNTER — Other Ambulatory Visit: Payer: Self-pay | Admitting: Cardiology

## 2018-09-10 ENCOUNTER — Ambulatory Visit: Payer: Medicare Other | Admitting: Cardiology

## 2018-09-22 ENCOUNTER — Telehealth: Payer: Self-pay | Admitting: Oncology

## 2018-09-22 NOTE — Telephone Encounter (Signed)
Rescheduled 4/30 lab appt to early June per sch msg. Called patient. No answer. Left msg.

## 2018-09-25 ENCOUNTER — Other Ambulatory Visit: Payer: Medicare Other

## 2018-10-28 ENCOUNTER — Other Ambulatory Visit: Payer: Self-pay | Admitting: Cardiology

## 2018-10-28 NOTE — Telephone Encounter (Signed)
Prescription refill request for Eliquis received.  Last office visit: Dr. Mayford Knife (09-05-2017) Next office visit scheduled for January 08, 2019 Scr: 1.29 (06-26-2018) Age: 83 Weight: 94.7kg (05-08-2018)

## 2018-11-06 ENCOUNTER — Other Ambulatory Visit: Payer: Self-pay

## 2018-11-06 ENCOUNTER — Inpatient Hospital Stay: Payer: Medicare Other | Attending: Oncology

## 2018-11-06 DIAGNOSIS — Z87891 Personal history of nicotine dependence: Secondary | ICD-10-CM | POA: Insufficient documentation

## 2018-11-06 DIAGNOSIS — I10 Essential (primary) hypertension: Secondary | ICD-10-CM | POA: Diagnosis not present

## 2018-11-06 DIAGNOSIS — Z79899 Other long term (current) drug therapy: Secondary | ICD-10-CM | POA: Diagnosis not present

## 2018-11-06 DIAGNOSIS — I4821 Permanent atrial fibrillation: Secondary | ICD-10-CM | POA: Insufficient documentation

## 2018-11-06 DIAGNOSIS — Z7901 Long term (current) use of anticoagulants: Secondary | ICD-10-CM | POA: Diagnosis not present

## 2018-11-06 LAB — COMPREHENSIVE METABOLIC PANEL
ALT: 14 U/L (ref 0–44)
AST: 16 U/L (ref 15–41)
Albumin: 4.1 g/dL (ref 3.5–5.0)
Alkaline Phosphatase: 55 U/L (ref 38–126)
Anion gap: 10 (ref 5–15)
BUN: 13 mg/dL (ref 8–23)
CO2: 27 mmol/L (ref 22–32)
Calcium: 9.7 mg/dL (ref 8.9–10.3)
Chloride: 96 mmol/L — ABNORMAL LOW (ref 98–111)
Creatinine, Ser: 1.24 mg/dL (ref 0.61–1.24)
GFR calc Af Amer: >60 mL/min (ref >60)
GFR calc non Af Amer: 54 mL/min — ABNORMAL LOW (ref >60)
Glucose, Bld: 84 mg/dL (ref 70–99)
Potassium: 4.2 mmol/L (ref 3.5–5.1)
Sodium: 133 mmol/L — ABNORMAL LOW (ref 135–145)
Total Bilirubin: 1.4 mg/dL — ABNORMAL HIGH (ref 0.3–1.2)
Total Protein: 6.9 g/dL (ref 6.5–8.1)

## 2018-11-06 LAB — CBC WITH DIFFERENTIAL/PLATELET
Abs Immature Granulocytes: 0.02 10*3/uL (ref 0.00–0.07)
Basophils Absolute: 0 10*3/uL (ref 0.0–0.1)
Basophils Relative: 1 %
Eosinophils Absolute: 0.3 10*3/uL (ref 0.0–0.5)
Eosinophils Relative: 4 %
HCT: 41 % (ref 39.0–52.0)
Hemoglobin: 14.3 g/dL (ref 13.0–17.0)
Immature Granulocytes: 0 %
Lymphocytes Relative: 23 %
Lymphs Abs: 1.5 10*3/uL (ref 0.7–4.0)
MCH: 33.6 pg (ref 26.0–34.0)
MCHC: 34.9 g/dL (ref 30.0–36.0)
MCV: 96.2 fL (ref 80.0–100.0)
Monocytes Absolute: 0.7 10*3/uL (ref 0.1–1.0)
Monocytes Relative: 11 %
Neutro Abs: 3.9 10*3/uL (ref 1.7–7.7)
Neutrophils Relative %: 61 %
Platelets: 192 10*3/uL (ref 150–400)
RBC: 4.26 MIL/uL (ref 4.22–5.81)
RDW: 12.8 % (ref 11.5–15.5)
WBC: 6.4 10*3/uL (ref 4.0–10.5)
nRBC: 0 % (ref 0.0–0.2)

## 2018-11-06 LAB — FERRITIN: Ferritin: 167 ng/mL (ref 24–336)

## 2018-11-12 ENCOUNTER — Telehealth: Payer: Self-pay | Admitting: *Deleted

## 2018-11-12 NOTE — Telephone Encounter (Signed)
Noted ferritin of 167 per known hemachromatosis and MD recommendation for phlebotomies weekly until less then 50.  Pt usually obtains a PICC line for phlebotomies due to difficulty in obtaining peripherally.  This RN called pt and left message on identified VM per above need.  RN requested a return call to discuss when he would like to proceed with plan.

## 2018-11-14 ENCOUNTER — Telehealth: Payer: Self-pay | Admitting: *Deleted

## 2018-11-14 ENCOUNTER — Other Ambulatory Visit: Payer: Self-pay | Admitting: *Deleted

## 2018-11-14 NOTE — Telephone Encounter (Signed)
This RN spoke with Eugene [name] per need to restart weekly phlebotomies.  Verified need for PICC with [name] in agreement.  He is requesting therapy to start week of July 13th.  Order entered for PICC line placement week of above and LOS sent for weekly labs and phlebotomies.

## 2018-11-17 ENCOUNTER — Other Ambulatory Visit: Payer: Self-pay | Admitting: Oncology

## 2018-11-18 ENCOUNTER — Telehealth: Payer: Self-pay | Admitting: Oncology

## 2018-11-18 NOTE — Telephone Encounter (Signed)
Scheduled appt per 6/19 sch message - pt aware of appt on 7/13 and will get an updated schedule when he comes in on that day

## 2018-12-08 ENCOUNTER — Other Ambulatory Visit: Payer: Self-pay | Admitting: *Deleted

## 2018-12-08 ENCOUNTER — Ambulatory Visit (HOSPITAL_COMMUNITY)
Admission: RE | Admit: 2018-12-08 | Discharge: 2018-12-08 | Disposition: A | Discharge status: Home / Self Care | Payer: Medicare Other | Source: Ambulatory Visit | Attending: Oncology | Admitting: Oncology

## 2018-12-08 ENCOUNTER — Inpatient Hospital Stay: Payer: Medicare Other | Attending: Oncology

## 2018-12-08 ENCOUNTER — Other Ambulatory Visit: Payer: Self-pay

## 2018-12-08 ENCOUNTER — Encounter (HOSPITAL_COMMUNITY): Payer: Self-pay | Admitting: Diagnostic Radiology

## 2018-12-08 VITALS — BP 120/70 | HR 88 | Temp 97.6°F | Resp 18

## 2018-12-08 DIAGNOSIS — I878 Other specified disorders of veins: Secondary | ICD-10-CM | POA: Diagnosis not present

## 2018-12-08 DIAGNOSIS — Z452 Encounter for adjustment and management of vascular access device: Secondary | ICD-10-CM

## 2018-12-08 HISTORY — PX: IR FLUORO GUIDE CV LINE LEFT: IMG2282

## 2018-12-08 MED ORDER — LIDOCAINE HCL 1 % IJ SOLN
INTRAMUSCULAR | Status: AC
  Filled 2018-12-08: qty 20

## 2018-12-08 MED ORDER — HEPARIN & NACL LOCK FLUSH 100-0.9 UNIT/ML-% IV KIT: 5.0000 mL | PACK | INTRAVENOUS | 1 refills | Status: DC | PRN

## 2018-12-08 MED ORDER — SODIUM CHLORIDE 0.9% FLUSH
10.0000 mL | Freq: Once | INTRAVENOUS | Status: AC
  Administered 2018-12-08: 14:00:00 10 mL via INTRAVENOUS
  Filled 2018-12-08: qty 10

## 2018-12-08 MED ORDER — HEPARIN SOD (PORK) LOCK FLUSH 100 UNIT/ML IV SOLN
500.0000 [IU] | Freq: Once | INTRAVENOUS | Status: AC
  Administered 2018-12-08: 250 [IU] via INTRAVENOUS
  Filled 2018-12-08: qty 5

## 2018-12-08 MED ORDER — SODIUM CHLORIDE 0.9% FLUSH: 10.0000 mL | INTRAVENOUS | 1 refills | Status: DC | PRN

## 2018-12-08 MED FILL — HEPARIN SOD 100U/ML PFS: 100 | 70 days supply | Qty: 150 | Fill #0

## 2018-12-08 MED FILL — NORMAL SALINE FLUSH SYRINGE: 0.9 | 70 days supply | Qty: 300 | Fill #0

## 2018-12-08 NOTE — Progress Notes (Signed)
Eugene [name] presents today for phlebotomy per MD orders.  PICC line in R arm used. Phlebotomy procedure started at 1340 and ended at 1410. 500 ml's removed. Patient observed for 30 minutes after procedure without any incident. Patient tolerated procedure well. VS WNL.

## 2018-12-08 NOTE — Patient Instructions (Signed)

## 2018-12-08 NOTE — Procedures (Signed)
Interventional Radiology Procedure:   Indications: Hemochromatosis and poor venous access   Procedure: PICC placement    Findings: Right arm, brachial vein, 45 cm, single lumen PowerPICC, tip at SVC/RA junction  Complications: None     EBL: less than 10 ml  Plan: PICC is ready to use.     [name] R. Anselm Pancoast, MD  Pager: [phone]

## 2018-12-10 ENCOUNTER — Other Ambulatory Visit: Payer: Self-pay

## 2018-12-10 ENCOUNTER — Inpatient Hospital Stay: Payer: Medicare Other

## 2018-12-10 ENCOUNTER — Telehealth: Payer: Self-pay | Admitting: *Deleted

## 2018-12-10 DIAGNOSIS — Z95828 Presence of other vascular implants and grafts: Secondary | ICD-10-CM

## 2018-12-10 MED ORDER — HEPARIN SOD (PORK) LOCK FLUSH 100 UNIT/ML IV SOLN
500.0000 [IU] | Freq: Once | INTRAVENOUS | Status: AC
  Administered 2018-12-10: 15:00:00 500 [IU] via INTRAVENOUS
  Filled 2018-12-10: qty 5

## 2018-12-10 MED ORDER — SODIUM CHLORIDE 0.9% FLUSH
10.0000 mL | INTRAVENOUS | Status: DC | PRN
  Administered 2018-12-10: 10 mL via INTRAVENOUS
  Filled 2018-12-10: qty 10

## 2018-12-10 NOTE — Patient Instructions (Signed)
PICC Home Care Guide ° °A peripherally inserted central catheter (PICC) is a form of IV access that allows medicines and IV fluids to be quickly distributed throughout the body. The PICC is a long, thin, flexible tube (catheter) that is inserted into a vein in the upper arm. The catheter ends in a large vein in the chest (superior vena cava, or SVC). After the PICC is inserted, a chest X-ray may be done to make sure that it is in the correct place. °A PICC may be placed for different reasons, such as: °· To give medicines and liquid nutrition. °· To give IV fluids and blood products. °· If there is trouble placing a peripheral intravenous (PIV) catheter. °If taken care of properly, a PICC can remain in place for several months. Having a PICC can also allow a person to go home from the hospital sooner. Medicine and PICC care can be managed at home by a family member, caregiver, or home health care team. °What are the risks? °Generally, having a PICC is safe. However, problems may occur, including: °· A blood clot (thrombus) forming in or at the tip of the PICC. °· A blood clot forming in a vein (deep vein thrombosis) or traveling to the lung (pulmonary embolism). °· Inflammation of the vein (phlebitis) in which the PICC is placed. °· Infection. Central line associated blood stream infection (CLABSI) is a serious infection that often requires hospitalization. °· PICC movement (malposition). The PICC tip may move from its original position due to excessive physical activity, forceful coughing, sneezing, or vomiting. °· A break or cut in the PICC. It is important not to use scissors near the PICC. °· Nerve or tendon irritation or injury during PICC insertion. °How to take care of your PICC °Preventing problems °· You and any caregivers should wash your hands often with soap. Wash hands: °? Before touching the PICC line or the infusion device. °? Before changing a bandage (dressing). °· Flush the PICC as told by your  health care provider. Let your health care provider know right away if the PICC is hard to flush or does not flush. Do not use force to flush the PICC. °· Do not use a syringe that is less than 10 mL to flush the PICC. °· Avoid blood pressure checks on the arm in which the PICC is placed. °· Never pull or tug on the PICC. °· Do not take the PICC out yourself. Only a trained clinical professional should remove the PICC. °· Use clean and sterile supplies only. Keep the supplies in a dry place. Do not reuse needles, syringes, or any other supplies. Doing that can lead to infection. °· Keep pets and children away from your PICC line. °· Check the PICC insertion site every day for signs of infection. Check for: °? Leakage. °? Redness, swelling, or pain. °? Fluid or blood. °? Warmth. °? Pus or a bad smell. °PICC dressing care °· Keep your PICC bandage (dressing) clean and dry to prevent infection. °· Do not take baths, swim, or use a hot tub until your health care provider approves. Ask your health care provider if you can take showers. You may only be allowed to take sponge baths for bathing. When you are allowed to shower: °? Ask your health care provider to teach you how to wrap the PICC line. °? Cover the PICC line with clear plastic wrap and tape to keep it dry while showering. °· Follow instructions from your health care provider   about how to take care of your insertion site and dressing. Make sure you: °? Wash your hands with soap and water before you change your bandage (dressing). If soap and water are not available, use hand sanitizer. °? Change your dressing as told by your health care provider. °? Leave stitches (sutures), skin glue, or adhesive strips in place. These skin closures may need to stay in place for 2 weeks or longer. If adhesive strip edges start to loosen and curl up, you may trim the loose edges. Do not remove adhesive strips completely unless your health care provider tells you to do  that. °· Change your PICC dressing if it becomes loose or wet. °General instructions ° °· Carry your PICC identification card or wear a medical alert bracelet at all times. °· Keep the tube clamped at all times, unless it is being used. °· Carry a smooth-edge clamp with you at all times to place on the tube if it breaks. °· Do not use scissors or sharp objects near the tube. °· You may bend your arm and move it freely. If your PICC is near or at the bend of your elbow, avoid activity with repeated motion at the elbow. °· Avoid lifting heavy objects as told by your health care provider. °· Keep all follow-up visits as told by your health care provider. This is important. °Disposal of supplies °· Throw away any syringes in a disposal container that is meant for sharp items (sharps container). You can buy a sharps container from a pharmacy, or you can make one by using an empty hard plastic bottle with a cover. °· Place any used dressings or infusion bags into a plastic bag. Throw that bag in the trash. °Contact a health care provider if: °· You have pain in your arm, ear, face, or teeth. °· You have a fever or chills. °· You have redness, swelling, or pain around the insertion site. °· You have fluid or blood coming from the insertion site. °· Your insertion site feels warm to the touch. °· You have pus or a bad smell coming from the insertion site. °· Your skin feels hard and raised around the insertion site. °Get help right away if: °· Your PICC is accidentally pulled all the way out. If this happens, cover the insertion site with a bandage or gauze dressing. Do not throw the PICC away. Your health care provider will need to check it. °· Your PICC was tugged or pulled and has partially come out. Do not  push the PICC back in. °· You cannot flush the PICC, it is hard to flush, or the PICC leaks around the insertion site when it is flushed. °· You hear a "flushing" sound when the PICC is flushed. °· You feel your  heart racing or skipping beats. °· There is a hole or tear in the PICC. °· You have swelling in the arm in which the PICC was inserted. °· You have a red streak going up your arm from where the PICC was inserted. °Summary °· A peripherally inserted central catheter (PICC) is a long, thin, flexible tube (catheter) that is inserted into a vein in the upper arm. °· The PICC is inserted using a sterile technique by a specially trained nurse or physician. Only a trained clinical professional should remove it. °· Keep your PICC identification card with you at all times. °· Avoid blood pressure checks on the arm in which the PICC is placed. °· If cared for   properly, a PICC can remain in place for several months. Having a PICC can also allow a person to go home from the hospital sooner. °This information is not intended to replace advice given to you by your health care provider. Make sure you discuss any questions you have with your health care provider. °Document Released: 11/18/2002 Document Revised: 04/26/2017 Document Reviewed: 06/16/2016 °Elsevier Patient Education © 2020 [employer] Inc[employer] ° °

## 2018-12-10 NOTE — Telephone Encounter (Signed)
Pt left vm that his PICC site was oozing after taking a shower this am.  Called pt & he has applied pressure dsg. Informed that pt should come in to have redressed & he wants to come @ 2pm.  Message to scheduler.

## 2018-12-10 NOTE — Progress Notes (Signed)
PT was advised to came in by [facility] due to bleeding. I had Tim, RN look at the PICC line due to the nature of the bleeding and issues the PT expressed happened during placement. The dressing was saturated with blood, so it was changed, flushed and cleaned. After I cleaned the site I had Tim, RN look at the site again because there was a small amount of bleeding still. He advised the site looked ok and to place dressing with no biopatch because chart stated PT wasn't allowed to use due to allergy. We placed a small pressure dressing on the site with 4x4 and Coban and instructed the PT to remove the dressing in about 5 hours. Also gave instructions to call us immediately if the bleeding continues.

## 2018-12-11 ENCOUNTER — Inpatient Hospital Stay: Payer: Medicare Other

## 2018-12-11 ENCOUNTER — Other Ambulatory Visit: Payer: Self-pay

## 2018-12-11 ENCOUNTER — Other Ambulatory Visit: Payer: Self-pay | Admitting: *Deleted

## 2018-12-11 ENCOUNTER — Telehealth: Payer: Self-pay | Admitting: *Deleted

## 2018-12-11 LAB — CBC WITH DIFFERENTIAL/PLATELET
Abs Immature Granulocytes: 0.03 10*3/uL (ref 0.00–0.07)
Basophils Absolute: 0 10*3/uL (ref 0.0–0.1)
Basophils Relative: 1 %
Eosinophils Absolute: 0.3 10*3/uL (ref 0.0–0.5)
Eosinophils Relative: 4 %
HCT: 38.1 % — ABNORMAL LOW (ref 39.0–52.0)
Hemoglobin: 13.2 g/dL (ref 13.0–17.0)
Immature Granulocytes: 1 %
Lymphocytes Relative: 23 %
Lymphs Abs: 1.5 10*3/uL (ref 0.7–4.0)
MCH: 33.2 pg (ref 26.0–34.0)
MCHC: 34.6 g/dL (ref 30.0–36.0)
MCV: 96 fL (ref 80.0–100.0)
Monocytes Absolute: 0.5 10*3/uL (ref 0.1–1.0)
Monocytes Relative: 8 %
Neutro Abs: 4.1 10*3/uL (ref 1.7–7.7)
Neutrophils Relative %: 63 %
Platelets: 209 10*3/uL (ref 150–400)
RBC: 3.97 MIL/uL — ABNORMAL LOW (ref 4.22–5.81)
RDW: 12.8 % (ref 11.5–15.5)
WBC: 6.4 10*3/uL (ref 4.0–10.5)
nRBC: 0 % (ref 0.0–0.2)

## 2018-12-11 NOTE — Telephone Encounter (Addendum)
This RN spoke with Clair Gulling per his call stating ongoing oozing at PICC line site despite dressing change yesterday with pressure bandage.  He removed it as instructed - and this AM he is noticing continued bleeding under bandage.  Clair Gulling stated desire to have another phlebotomy today and then pull the PICC.   Allow time for adaquete healing and the have another PICC inserted for further phlebotomies.  Per review with MD- above is appropriate.  Time for phlebotomy obtained for today ( he has a prior commitment at 1 pm )-  Order will be placed for phleb and picc line removal

## 2018-12-11 NOTE — Progress Notes (Signed)
Eugene [name] presents today for phlebotomy per MD orders. Phlebotomy procedure via Right PICC started at 1525 and ended at 1540. 467ml removed. CBC andferritin levels drawn  Patient observed for 30 minutes after procedure without any incident. Patient tolerated procedure well. vss PICC removed catheter intact

## 2018-12-12 LAB — FERRITIN: Ferritin: 175 ng/mL (ref 24–336)

## 2018-12-15 ENCOUNTER — Other Ambulatory Visit: Payer: Medicare Other

## 2018-12-20 ENCOUNTER — Other Ambulatory Visit: Payer: Self-pay | Admitting: Cardiology

## 2018-12-22 ENCOUNTER — Other Ambulatory Visit: Payer: Medicare Other

## 2018-12-22 NOTE — Telephone Encounter (Signed)
Eliquis 5mg  reill request received; pt is 83 yrs old, wt-94.7kg, Crea-1.24 on 11/06/2018, last seen by Dr. Radford Pax on 09/05/2017 and has a scheduled appt for 01/08/2019, diagnosis AFIB; will send in refill.

## 2018-12-24 ENCOUNTER — Other Ambulatory Visit: Payer: Self-pay | Admitting: Cardiology

## 2018-12-25 ENCOUNTER — Other Ambulatory Visit: Payer: Medicare Other

## 2018-12-29 ENCOUNTER — Other Ambulatory Visit: Payer: Medicare Other

## 2019-01-05 ENCOUNTER — Other Ambulatory Visit: Payer: Medicare Other

## 2019-01-08 ENCOUNTER — Ambulatory Visit: Payer: Medicare Other | Admitting: Cardiology

## 2019-01-12 ENCOUNTER — Other Ambulatory Visit: Payer: Medicare Other

## 2019-01-20 ENCOUNTER — Encounter: Payer: Self-pay | Admitting: Cardiology

## 2019-02-19 ENCOUNTER — Ambulatory Visit: Payer: Medicare Other | Admitting: Cardiology

## 2019-02-26 ENCOUNTER — Other Ambulatory Visit: Payer: Self-pay | Admitting: Cardiology

## 2019-03-12 ENCOUNTER — Ambulatory Visit (INDEPENDENT_AMBULATORY_CARE_PROVIDER_SITE_OTHER): Payer: Medicare Other | Admitting: Cardiology

## 2019-03-12 ENCOUNTER — Other Ambulatory Visit: Payer: Self-pay

## 2019-03-12 ENCOUNTER — Encounter: Payer: Self-pay | Admitting: Cardiology

## 2019-03-12 VITALS — BP 128/68 | HR 76 | Ht 73.0 in | Wt 206.0 lb

## 2019-03-12 DIAGNOSIS — I10 Essential (primary) hypertension: Secondary | ICD-10-CM

## 2019-03-12 DIAGNOSIS — I4821 Permanent atrial fibrillation: Secondary | ICD-10-CM | POA: Diagnosis not present

## 2019-03-12 DIAGNOSIS — I350 Nonrheumatic aortic (valve) stenosis: Secondary | ICD-10-CM | POA: Diagnosis not present

## 2019-03-12 NOTE — Patient Instructions (Signed)
Medication Instructions:  Your physician recommends that you continue on your current medications as directed. Please refer to the Current Medication list given to you today.  If you need a refill on your cardiac medications before your next appointment, please call your pharmacy.   Lab work: None Ordered  If you have labs (blood work) drawn today and your tests are completely normal, you will receive your results only by: . MyChart Message (if you have MyChart) OR . A paper copy in the mail If you have any lab test that is abnormal or we need to change your treatment, we will call you to review the results.  Testing/Procedures: Your physician has requested that you have an echocardiogram. Echocardiography is a painless test that uses sound waves to create images of your heart. It provides your doctor with information about the size and shape of your heart and how well your heart's chambers and valves are working. This procedure takes approximately one hour. There are no restrictions for this procedure.   Follow-Up: At [facility], you and your health needs are our priority.  As part of our continuing mission to provide you with exceptional heart care, we have created designated Provider Care Teams.  These Care Teams include your primary Cardiologist (physician) and Advanced Practice Providers (APPs -  Physician Assistants and Nurse Practitioners) who all work together to provide you with the care you need, when you need it. . You will need a follow up appointment in 1 year.  Please call our office 2 months in advance to schedule this appointment.  You may see Traci [name], MD or one of the following Advanced Practice Providers on your designated Care Team:   . Brittainy Simmons, PA-C . Dayna Dunn, PA-C . Michele Lenze, PA-C  Any Other Special Instructions Will Be Listed Below (If Applicable).    

## 2019-03-12 NOTE — Progress Notes (Addendum)
Cardiology Office Note:    Date:  03/12/2019   ID:  Eugene [name], DOB 17-Aug-1935, MRN [id number]  PCP:  [name], MD  Cardiologist:  No primary care provider on file.    Referring MD: [name], MD   Chief Complaint  Patient presents with  . Atrial Fibrillation  . Hypertension    History of Present Illness:    Eugene [name] is a 83 y.o. male with a hx of HTN,permanentatrial fibrillation s/p failed DCCV x2 on chronic anticoagulation.  He is here today for followup and is doing well.  He denies any chest pain or pressure, SOB, DOE, PND, orthopnea, LE edema, dizziness, palpitations or syncope. He is compliant with his meds and is tolerating meds with no SE.    Past Medical History:  Diagnosis Date  . AAA (abdominal aortic aneurysm) (HCC) 06/29/2007   stent graft  . Aortic stenosis 09/05/2017   Mild AS/AR by echo 01/2017.  Marland Kitchen GERD (gastroesophageal reflux disease)   . Hemochromatosis   . Hyperlipidemia   . Hypertension   . Irritable bowel syndrome   . Permanent atrial fibrillation (HCC)   . Stroke [facility])    Transient Ischemic Attack    Past Surgical History:  Procedure Laterality Date  . ABDOMINAL AORTIC ANEURYSM REPAIR    . ENDOVASCULAR STENT INSERTION  07/18/2007   saccular infrarenal aortic aneurysm  . EYE SURGERY Bilateral    Cataract    . IR FLUORO GUIDE CV LINE LEFT  12/08/2018  . IR FLUORO GUIDE CV LINE RIGHT  02/13/2017  . IR FLUORO GUIDE CV LINE RIGHT  02/25/2017  . IR US GUIDE VASC ACCESS RIGHT  02/13/2017  . PICC LINE PLACE PERIPHERAL ([facility] HX)  Oct 21, 2014    Current Medications: Current Meds  Medication Sig  . ALPRAZolam (XANAX) 0.5 MG tablet Take 0.5 mg by mouth as needed. For anxiety  . amLODipine (NORVASC) 10 MG tablet TAKE 1 TABLET BY MOUTH EVERY DAY  . Ascorbic Acid (VITAMIN C) 100 MG tablet Take 100 mg by mouth.   Marland Kitchen atorvastatin (LIPITOR) 20 MG tablet Take 20 mg by mouth daily.   Marland Kitchen ELIQUIS 5 MG TABS tablet TAKE 1 TABLET BY MOUTH  TWICE A DAY  . GAVILYTE-G 236 g solution See admin instructions.  . hydrochlorothiazide (HYDRODIURIL) 25 MG tablet Take 25 mg by mouth daily.  . hyoscyamine (LEVBID) 0.375 MG 12 hr tablet Take 0.375 mg by mouth as needed.   Marland Kitchen KLOR-CON M20 20 MEQ tablet TAKE 1 TABLET BY MOUTH EVERY DAY  . LORazepam (ATIVAN) 0.5 MG tablet Take 0.5 mg by mouth at bedtime.   . metoprolol tartrate (LOPRESSOR) 25 MG tablet Take 25 mg by mouth 2 (two) times daily.   . ramipril (ALTACE) 5 MG capsule Take 5 mg by mouth daily.   Marland Kitchen thiamine (VITAMIN B-1) 100 MG tablet Take 250 mg by mouth daily.  . vitamin E 100 UNIT capsule Take 100 Units by mouth daily.  Marland Kitchen zolpidem (AMBIEN) 5 MG tablet Take 1 tablet (5 mg total) by mouth at bedtime as needed for sleep. May take 2 tabs ( 10mg  ) if needed. Call office so new script can be called in.     Allergies:   Chlorhexidine gluconate and Tape   Social History   Socioeconomic History  . Marital status: Married    Spouse name: Not on file  . Number of children: Not on file  . Years of education: Not on file  .  Highest education level: Not on file  Occupational History  . Not on file  Social Needs  . Financial resource strain: Not on file  . Food insecurity    Worry: Not on file    Inability: Not on file  . Transportation needs    Medical: Not on file    Non-medical: Not on file  Tobacco Use  . Smoking status: Former Smoker    Types: Cigarettes    Quit date: 06/18/1999    Years since quitting: 19.7  . Smokeless tobacco: Never Used  Substance and Sexual Activity  . Alcohol use: Yes    Alcohol/week: 1.0 - 2.0 standard drinks    Types: 1 - 2 Standard drinks or equivalent per week  . Drug use: No  . Sexual activity: Not on file  Lifestyle  . Physical activity    Days per week: Not on file    Minutes per session: Not on file  . Stress: Not on file  Relationships  . Social Herbalist on phone: Not on file    Gets together: Not on file    Attends  religious service: Not on file    Active member of club or organization: Not on file    Attends meetings of clubs or organizations: Not on file    Relationship status: Not on file  Other Topics Concern  . Not on file  Social History Narrative  . Not on file     Family History: The patient's family history includes Heart disease in his father and mother; Hypertension in his mother.  ROS:   Please see the history of present illness.    ROS  All other systems reviewed and negative.   EKGs/Labs/Other Studies Reviewed:    The following studies were reviewed today: none  EKG:  EKG is ordered today and showed atrial fibrillation with CVR and IRBBB  Recent Labs: 11/06/2018: ALT 14; BUN 13; Creatinine, Ser 1.24; Potassium 4.2; Sodium 133 12/11/2018: Hemoglobin 13.2; Platelets 209   Recent Lipid Panel No results found for: CHOL, TRIG, HDL, CHOLHDL, VLDL, LDLCALC, LDLDIRECT  Physical Exam:    VS:  BP 128/68   Pulse 76   Ht 6\' 1"  (1.854 m)   Wt 206 lb (93.4 kg)   BMI 27.18 kg/m     Wt Readings from Last 3 Encounters:  03/12/19 206 lb (93.4 kg)  05/08/18 208 lb 12.8 oz (94.7 kg)  11/11/17 207 lb (93.9 kg)     GEN:  Well nourished, well developed in no acute distress HEENT: Normal NECK: No JVD; No carotid bruits LYMPHATICS: No lymphadenopathy CARDIAC: irregularly irregular, no murmurs, rubs, gallops RESPIRATORY:  Clear to auscultation without rales, wheezing or rhonchi  ABDOMEN: Soft, non-tender, non-distended MUSCULOSKELETAL:  No edema; No deformity  SKIN: Warm and dry NEUROLOGIC:  Alert and oriented x 3 PSYCHIATRIC:  Normal affect   ASSESSMENT:    1. Permanent atrial fibrillation ([place])   2. Benign essential HTN   3. Nonrheumatic aortic valve stenosis   4. Hereditary hemochromatosis ([place])    PLAN:    In order of problems listed above:  1.  Permanent atrial fibrillation -HR well controlled -no bleeding problems on Eliquis -continue Eliquis 5mg  BID and  Lopressor 25mg  BID -Hb stable at 15 last month  2.  HTN -BP controlled on exam -continue amlodipine 10mg  daily, HCTZ 25mg  daily, ramipril 5mg  daily and Lopressor 25mg  BID -creatinine 1.2 last month  3.  Aortic stenosis/regurgitation -2D echo 2018 with  possible bicuspid AV and very mild AS/AR  4.  Hemachromatosis -2D echo with moderate LVH and normal LVF 2018 -cardiac MRI ordered but never done -repeat echo to reassess LVF  5.  Moderate TR -noted on echo 2018 with mildly dilated RV and mildly reduced RVF. -repeat echo to assess for further worsening of TR  Medication Adjustments/Labs and Tests Ordered: Current medicines are reviewed at length with the patient today.  Concerns regarding medicines are outlined above.  Orders Placed This Encounter  Procedures  . EKG 12-Lead   No orders of the defined types were placed in this encounter.   Signed, Armanda Magicraci [name], MD  03/12/2019 2:20 PM    [facility]

## 2019-03-17 ENCOUNTER — Other Ambulatory Visit (HOSPITAL_COMMUNITY): Payer: Medicare Other

## 2019-03-24 ENCOUNTER — Encounter (HOSPITAL_COMMUNITY): Payer: Self-pay | Admitting: Cardiology

## 2019-03-26 ENCOUNTER — Other Ambulatory Visit: Payer: Self-pay

## 2019-03-26 ENCOUNTER — Inpatient Hospital Stay: Payer: Medicare Other | Attending: Oncology

## 2019-03-26 LAB — CBC WITH DIFFERENTIAL/PLATELET
Abs Immature Granulocytes: 0.03 10*3/uL (ref 0.00–0.07)
Basophils Absolute: 0 10*3/uL (ref 0.0–0.1)
Basophils Relative: 1 %
Eosinophils Absolute: 0.2 10*3/uL (ref 0.0–0.5)
Eosinophils Relative: 3 %
HCT: 41.3 % (ref 39.0–52.0)
Hemoglobin: 14.7 g/dL (ref 13.0–17.0)
Immature Granulocytes: 1 %
Lymphocytes Relative: 23 %
Lymphs Abs: 1.5 10*3/uL (ref 0.7–4.0)
MCH: 33.3 pg (ref 26.0–34.0)
MCHC: 35.6 g/dL (ref 30.0–36.0)
MCV: 93.7 fL (ref 80.0–100.0)
Monocytes Absolute: 0.6 10*3/uL (ref 0.1–1.0)
Monocytes Relative: 10 %
Neutro Abs: 4 10*3/uL (ref 1.7–7.7)
Neutrophils Relative %: 62 %
Platelets: 202 10*3/uL (ref 150–400)
RBC: 4.41 MIL/uL (ref 4.22–5.81)
RDW: 12.6 % (ref 11.5–15.5)
WBC: 6.4 10*3/uL (ref 4.0–10.5)
nRBC: 0 % (ref 0.0–0.2)

## 2019-03-26 LAB — COMPREHENSIVE METABOLIC PANEL
ALT: 13 U/L (ref 0–44)
AST: 16 U/L (ref 15–41)
Albumin: 4 g/dL (ref 3.5–5.0)
Alkaline Phosphatase: 57 U/L (ref 38–126)
Anion gap: 11 (ref 5–15)
BUN: 12 mg/dL (ref 8–23)
CO2: 26 mmol/L (ref 22–32)
Calcium: 10.1 mg/dL (ref 8.9–10.3)
Chloride: 94 mmol/L — ABNORMAL LOW (ref 98–111)
Creatinine, Ser: 1.18 mg/dL (ref 0.61–1.24)
GFR calc Af Amer: >60 mL/min (ref >60)
GFR calc non Af Amer: 58 mL/min — ABNORMAL LOW (ref >60)
Glucose, Bld: 96 mg/dL (ref 70–99)
Potassium: 4.4 mmol/L (ref 3.5–5.1)
Sodium: 131 mmol/L — ABNORMAL LOW (ref 135–145)
Total Bilirubin: 1.2 mg/dL (ref 0.3–1.2)
Total Protein: 7 g/dL (ref 6.5–8.1)

## 2019-03-26 LAB — IRON AND TIBC
Iron: 160 ug/dL (ref 42–163)
Saturation Ratios: 69 % — ABNORMAL HIGH (ref 20–55)
TIBC: 233 ug/dL (ref 202–409)
UIBC: 73 ug/dL — ABNORMAL LOW (ref 117–376)

## 2019-03-26 LAB — FERRITIN: Ferritin: 189 ng/mL (ref 24–336)

## 2019-03-31 ENCOUNTER — Other Ambulatory Visit: Payer: Self-pay | Admitting: Cardiology

## 2019-04-08 ENCOUNTER — Ambulatory Visit (HOSPITAL_COMMUNITY): Payer: Medicare Other | Attending: Cardiology

## 2019-04-08 ENCOUNTER — Other Ambulatory Visit: Payer: Self-pay

## 2019-04-09 ENCOUNTER — Telehealth: Payer: Self-pay | Admitting: Nurse Practitioner

## 2019-04-09 NOTE — Telephone Encounter (Signed)
Reviewed Dr. Theodosia Blender advice with patient and advised him to call back sooner if he experiences SOB, fatigue, or other concerns. He verbalized understanding and agreement and thanked me for the call.

## 2019-04-09 NOTE — Telephone Encounter (Signed)
We can hold off on MRI and repeat echo in 6 months to make sure EF has not declined anymore

## 2019-04-09 NOTE — Telephone Encounter (Signed)
-----   Message from Sueanne Margarita, MD sent at 04/09/2019  7:11 AM EST ----- Please let patient know that echo showed low normal LVF with mildly thickened heart muscle, mildly enlarged LA, severe RA enlargement, calcified MV, mildly leaky AV and calcified AV

## 2019-04-09 NOTE — Telephone Encounter (Signed)
Reviewed results with patient who verbalized understanding. I advised that Dr. Radford Pax would like to order a cardiac MRI. I spoke with Crystal in MRI at [facility] and she advised that model name and number of the graft will be needed prior to being able to order cardiac MRI. Crystal and I searched patient's chart and were unable to locate that information. Patient states he does not have that information either. He asked what will be the benefit of the MRI for a person his age; states he is able to complete the activities that he enjoys without difficulty and he feels well for his age. He states he does not want to "go through any surgery unless he is going to die tomorrow." I answered questions about his echo report and he verbalized understanding. He states he will agree with MRI if it is really necessary but otherwise he does have any concerns about the way that he feels presently. I advised that I will forward this information to Dr. Radford Pax for review. He agrees with plan and thanked me for the call. I called and spoke with Jacqlyn Larsen, nurse at [facility] to find out if she has the model name and number of patient's Endologix graft device. She states she will try to locate that information and call back.

## 2019-04-14 ENCOUNTER — Other Ambulatory Visit: Payer: Self-pay

## 2019-04-14 ENCOUNTER — Inpatient Hospital Stay: Payer: Medicare Other | Attending: Oncology

## 2019-04-14 ENCOUNTER — Other Ambulatory Visit: Payer: Self-pay | Admitting: *Deleted

## 2019-04-14 ENCOUNTER — Ambulatory Visit: Payer: Medicare Other

## 2019-04-14 VITALS — BP 115/62 | HR 84 | Resp 16

## 2019-04-14 DIAGNOSIS — Z452 Encounter for adjustment and management of vascular access device: Secondary | ICD-10-CM

## 2019-04-14 LAB — CBC WITH DIFFERENTIAL/PLATELET
Abs Immature Granulocytes: 0.04 10*3/uL (ref 0.00–0.07)
Basophils Absolute: 0 10*3/uL (ref 0.0–0.1)
Basophils Relative: 0 %
Eosinophils Absolute: 0.1 10*3/uL (ref 0.0–0.5)
Eosinophils Relative: 1 %
HCT: 43.4 % (ref 39.0–52.0)
Hemoglobin: 15.2 g/dL (ref 13.0–17.0)
Immature Granulocytes: 1 %
Lymphocytes Relative: 11 %
Lymphs Abs: 0.9 10*3/uL (ref 0.7–4.0)
MCH: 33.2 pg (ref 26.0–34.0)
MCHC: 35 g/dL (ref 30.0–36.0)
MCV: 94.8 fL (ref 80.0–100.0)
Monocytes Absolute: 0.6 10*3/uL (ref 0.1–1.0)
Monocytes Relative: 7 %
Neutro Abs: 7 10*3/uL (ref 1.7–7.7)
Neutrophils Relative %: 80 %
Platelets: 207 10*3/uL (ref 150–400)
RBC: 4.58 MIL/uL (ref 4.22–5.81)
RDW: 13 % (ref 11.5–15.5)
WBC: 8.8 10*3/uL (ref 4.0–10.5)
nRBC: 0 % (ref 0.0–0.2)

## 2019-04-14 LAB — FERRITIN: Ferritin: 181 ng/mL (ref 24–336)

## 2019-04-28 ENCOUNTER — Inpatient Hospital Stay: Payer: Medicare Other

## 2019-04-28 ENCOUNTER — Inpatient Hospital Stay: Payer: Medicare Other | Attending: Oncology

## 2019-04-28 ENCOUNTER — Other Ambulatory Visit: Payer: Self-pay

## 2019-04-28 VITALS — BP 104/71 | HR 81 | Temp 98.7°F | Resp 17

## 2019-04-28 DIAGNOSIS — Z452 Encounter for adjustment and management of vascular access device: Secondary | ICD-10-CM

## 2019-04-28 LAB — CBC WITH DIFFERENTIAL (CANCER CENTER ONLY)
Abs Immature Granulocytes: 0.03 10*3/uL (ref 0.00–0.07)
Basophils Absolute: 0 10*3/uL (ref 0.0–0.1)
Basophils Relative: 0 %
Eosinophils Absolute: 0.2 10*3/uL (ref 0.0–0.5)
Eosinophils Relative: 3 %
HCT: 41.5 % (ref 39.0–52.0)
Hemoglobin: 14.6 g/dL (ref 13.0–17.0)
Immature Granulocytes: 0 %
Lymphocytes Relative: 18 %
Lymphs Abs: 1.3 10*3/uL (ref 0.7–4.0)
MCH: 33.5 pg (ref 26.0–34.0)
MCHC: 35.2 g/dL (ref 30.0–36.0)
MCV: 95.2 fL (ref 80.0–100.0)
Monocytes Absolute: 0.7 10*3/uL (ref 0.1–1.0)
Monocytes Relative: 10 %
Neutro Abs: 4.9 10*3/uL (ref 1.7–7.7)
Neutrophils Relative %: 69 %
Platelet Count: 220 10*3/uL (ref 150–400)
RBC: 4.36 MIL/uL (ref 4.22–5.81)
RDW: 13.5 % (ref 11.5–15.5)
WBC Count: 7.1 10*3/uL (ref 4.0–10.5)
nRBC: 0 % (ref 0.0–0.2)

## 2019-04-28 LAB — IRON AND TIBC
Iron: 159 ug/dL (ref 42–163)
Saturation Ratios: 65 % — ABNORMAL HIGH (ref 20–55)
TIBC: 246 ug/dL (ref 202–409)
UIBC: 87 ug/dL — ABNORMAL LOW (ref 117–376)

## 2019-04-28 LAB — FERRITIN: Ferritin: 134 ng/mL (ref 24–336)

## 2019-04-28 NOTE — Progress Notes (Signed)
Per [name] okay to proceed with phlebotomy today without labs complete.

## 2019-04-28 NOTE — Progress Notes (Signed)
Eugene [name] presents today for phlebotomy per MD orders. Phlebotomy procedure started at 1438 and ended at 1505 with 500  grams removed via an 18G to L forearm Patient observed for 15 minutes after procedure without any incident-pt declined to stay for 30 full minutes.  Patient tolerated procedure well. Diet and nutrition offered.

## 2019-05-07 ENCOUNTER — Other Ambulatory Visit: Payer: Self-pay

## 2019-05-07 ENCOUNTER — Inpatient Hospital Stay: Payer: Medicare Other

## 2019-05-07 LAB — CBC WITH DIFFERENTIAL/PLATELET
Abs Immature Granulocytes: 0.05 10*3/uL (ref 0.00–0.07)
Basophils Absolute: 0 10*3/uL (ref 0.0–0.1)
Basophils Relative: 0 %
Eosinophils Absolute: 0.2 10*3/uL (ref 0.0–0.5)
Eosinophils Relative: 2 %
HCT: 38.1 % — ABNORMAL LOW (ref 39.0–52.0)
Hemoglobin: 13.2 g/dL (ref 13.0–17.0)
Immature Granulocytes: 1 %
Lymphocytes Relative: 26 %
Lymphs Abs: 2.1 10*3/uL (ref 0.7–4.0)
MCH: 33.8 pg (ref 26.0–34.0)
MCHC: 34.6 g/dL (ref 30.0–36.0)
MCV: 97.4 fL (ref 80.0–100.0)
Monocytes Absolute: 0.8 10*3/uL (ref 0.1–1.0)
Monocytes Relative: 10 %
Neutro Abs: 5 10*3/uL (ref 1.7–7.7)
Neutrophils Relative %: 61 %
Platelets: 229 10*3/uL (ref 150–400)
RBC: 3.91 MIL/uL — ABNORMAL LOW (ref 4.22–5.81)
RDW: 13.9 % (ref 11.5–15.5)
WBC: 8.2 10*3/uL (ref 4.0–10.5)
nRBC: 0 % (ref 0.0–0.2)

## 2019-05-07 LAB — IRON AND TIBC
Iron: 194 ug/dL — ABNORMAL HIGH (ref 42–163)
Saturation Ratios: 79 % — ABNORMAL HIGH (ref 20–55)
TIBC: 246 ug/dL (ref 202–409)
UIBC: 53 ug/dL — ABNORMAL LOW (ref 117–376)

## 2019-05-07 LAB — FERRITIN: Ferritin: 117 ng/mL (ref 24–336)

## 2019-05-12 ENCOUNTER — Telehealth: Payer: Self-pay | Admitting: *Deleted

## 2019-05-12 ENCOUNTER — Other Ambulatory Visit: Payer: Self-pay

## 2019-05-12 ENCOUNTER — Inpatient Hospital Stay: Payer: Medicare Other

## 2019-05-12 VITALS — BP 119/88 | HR 98 | Resp 16

## 2019-05-12 DIAGNOSIS — Z452 Encounter for adjustment and management of vascular access device: Secondary | ICD-10-CM

## 2019-05-12 LAB — CBC WITH DIFFERENTIAL/PLATELET
Abs Immature Granulocytes: 0.03 10*3/uL (ref 0.00–0.07)
Basophils Absolute: 0 10*3/uL (ref 0.0–0.1)
Basophils Relative: 1 %
Eosinophils Absolute: 0.2 10*3/uL (ref 0.0–0.5)
Eosinophils Relative: 2 %
HCT: 39.2 % (ref 39.0–52.0)
Hemoglobin: 13.6 g/dL (ref 13.0–17.0)
Immature Granulocytes: 0 %
Lymphocytes Relative: 17 %
Lymphs Abs: 1.2 10*3/uL (ref 0.7–4.0)
MCH: 34.1 pg — ABNORMAL HIGH (ref 26.0–34.0)
MCHC: 34.7 g/dL (ref 30.0–36.0)
MCV: 98.2 fL (ref 80.0–100.0)
Monocytes Absolute: 0.7 10*3/uL (ref 0.1–1.0)
Monocytes Relative: 10 %
Neutro Abs: 4.8 10*3/uL (ref 1.7–7.7)
Neutrophils Relative %: 70 %
Platelets: 204 10*3/uL (ref 150–400)
RBC: 3.99 MIL/uL — ABNORMAL LOW (ref 4.22–5.81)
RDW: 14 % (ref 11.5–15.5)
WBC: 6.9 10*3/uL (ref 4.0–10.5)
nRBC: 0 % (ref 0.0–0.2)

## 2019-05-12 NOTE — Telephone Encounter (Signed)
Per MD- lab draw with phlebotomy results not required for phlebotomy. Phlebotomy is based on lab drawn with previous visit. Goal is to phlebotomize every 2 weeks until ferritin is 50 or less.

## 2019-05-12 NOTE — Patient Instructions (Signed)
Therapeutic Phlebotomy Therapeutic phlebotomy is the planned removal of blood from a person's body for the purpose of treating a medical condition. The procedure is similar to donating blood. Usually, about a pint (470 mL, or 0.47 L) of blood is removed. The average adult has 9-12 pints (4.3-5.7 L) of blood in the body. Therapeutic phlebotomy may be used to treat the following medical conditions:  Hemochromatosis. This is a condition in which the blood contains too much iron.  Polycythemia vera. This is a condition in which the blood contains too many red blood cells.  Porphyria cutanea tarda. This is a disease in which an important part of hemoglobin is not made properly. It results in the buildup of abnormal amounts of porphyrins in the body.  Sickle cell disease. This is a condition in which the red blood cells form an abnormal crescent shape rather than a round shape. Tell a health care provider about:  Any allergies you have.  All medicines you are taking, including vitamins, herbs, eye drops, creams, and over-the-counter medicines.  Any problems you or family members have had with anesthetic medicines.  Any blood disorders you have.  Any surgeries you have had.  Any medical conditions you have.  Whether you are pregnant or may be pregnant. What are the risks? Generally, this is a safe procedure. However, problems may occur, including:  Nausea or light-headedness.  Low blood pressure (hypotension).  Soreness, bleeding, swelling, or bruising at the needle insertion site.  Infection. What happens before the procedure?  Follow instructions from your health care provider about eating or drinking restrictions.  Ask your health care provider about: ? Changing or stopping your regular medicines. This is especially important if you are taking diabetes medicines or blood thinners (anticoagulants). ? Taking medicines such as aspirin and ibuprofen. These medicines can thin your  blood. Do not take these medicines unless your health care provider tells you to take them. ? Taking over-the-counter medicines, vitamins, herbs, and supplements.  Wear clothing with sleeves that can be raised above the elbow.  Plan to have someone take you home from the hospital or clinic.  You may have a blood sample taken.  Your blood pressure, pulse rate, and breathing rate will be measured. What happens during the procedure?   To lower your risk of infection: ? Your health care team will wash or sanitize their hands. ? Your skin will be cleaned with an antiseptic.  You may be given a medicine to numb the area (local anesthetic).  A tourniquet will be placed on your arm.  A needle will be inserted into one of your veins.  Tubing and a collection bag will be attached to that needle.  Blood will flow through the needle and tubing into the collection bag.  The collection bag will be placed lower than your arm to allow gravity to help the flow of blood into the bag.  You may be asked to open and close your hand slowly and continually during the entire collection.  After the specified amount of blood has been removed from your body, the collection bag and tubing will be clamped.  The needle will be removed from your vein.  Pressure will be held on the site of the needle insertion to stop the bleeding.  A bandage (dressing) will be placed over the needle insertion site. The procedure may vary among health care providers and hospitals. What happens after the procedure?  Your blood pressure, pulse rate, and breathing rate will be   measured after the procedure.  You will be encouraged to drink fluids.  Your recovery will be assessed and monitored.  You can return to your normal activities as told by your health care provider. Summary  Therapeutic phlebotomy is the planned removal of blood from a person's body for the purpose of treating a medical condition.  Therapeutic  phlebotomy may be used to treat hemochromatosis, polycythemia vera, porphyria cutanea tarda, or sickle cell disease.  In the procedure, a needle is inserted and about a pint (470 mL, or 0.47 L) of blood is removed. The average adult has 9-12 pints (4.3-5.7 L) of blood in the body.  This is generally a safe procedure, but it can sometimes cause problems such as nausea, light-headedness, or low blood pressure (hypotension). This information is not intended to replace advice given to you by your health care provider. Make sure you discuss any questions you have with your health care provider. Document Released: 10/16/2010 Document Revised: 05/30/2017 Document Reviewed: 05/30/2017 Elsevier Patient Education  2020 [employer]  

## 2019-05-13 LAB — FERRITIN: Ferritin: 103 ng/mL (ref 24–336)

## 2019-05-13 NOTE — Progress Notes (Signed)
Eugene [name]  [phone] [phone]    ID: Eugene [name]   DOB: 14-Aug-1935  MR#: [id number]  DXI#:[id number]  Patient Care Team: [name], MD as PCP - General (Internal Medicine) Layonna Dobie, Virgie Dad, MD (Hematology and Oncology) Sueanne Margarita, MD as Attending Physician (Cardiology) Darleen Crocker, MD as Consulting Physician (Ophthalmology) Juanita Craver, MD as Consulting Physician (Gastroenterology) Serafina Mitchell, MD as Consulting Physician (Vascular Surgery) OTHER MD:   INTERVAL HISTORY: Eugene [name] returns today for follow-up of his hemochromatosis.  He has been receiving phlebotomy every 2 weeks for 7 visits.  He underwent PICC line placement on 12/08/2018.  However this caused some bleeding and had to be removed.  He presented to his PCP with cough and fever. He underwent chest x-ray on 08/11/2018, which showed: mild basilar interstitial prominence, may be chronic, mild pneumonitis not excluded; cardiomegaly, no pulmonary venous congestion.   REVIEW OF SYSTEMS: Eugene [name] walks about 2 miles most days unless the weather is really bad.  They are taking appropriate pandemic precautions and were very cautious in Thanksgiving's.  They are planning to be equally cautious over Christmas.  There have been no intercurrent falls and although he is on an anticoagulant no unusual bleeding or bruising although after the next to last phlebotomy there was a little bit more bleeding than usual after he went home and he required longer pressure.  A detailed review of systems was otherwise stable.   HISTORY OF PRESENT ILLNESS: Eugene [name]'s hemochromatosis was documented by labs and genetics in 1989. His family (sister, children) have been tested and all are negative for the mutation   PAST MEDICAL HISTORY: Past Medical History:  Diagnosis Date  . AAA (abdominal aortic aneurysm) ([place]) 06/29/2007   stent graft  . Aortic stenosis 09/05/2017   Mild AS/AR by echo 01/2017.  Marland Kitchen  GERD (gastroesophageal reflux disease)   . Hemochromatosis   . Hyperlipidemia   . Hypertension   . Irritable bowel syndrome   . Permanent atrial fibrillation ([place])   . Stroke ([place])    Transient Ischemic Attack    PAST SURGICAL HISTORY: Past Surgical History:  Procedure Laterality Date  . ABDOMINAL AORTIC ANEURYSM REPAIR    . ENDOVASCULAR STENT INSERTION  07/18/2007   saccular infrarenal aortic aneurysm  . EYE SURGERY Bilateral    Cataract    . IR FLUORO GUIDE CV LINE LEFT  12/08/2018  . IR FLUORO GUIDE CV LINE RIGHT  02/13/2017  . IR FLUORO GUIDE CV LINE RIGHT  02/25/2017  . IR US GUIDE VASC ACCESS RIGHT  02/13/2017  . PICC LINE PLACE PERIPHERAL ([place])  Oct 21, 2014    FAMILY HISTORY Family History  Problem Relation Age of Onset  . Hypertension Mother   . Heart disease Mother        AAA, he passed in his 24 s of Heart Diseasse  . Heart disease Father        AAA history and he passed at age 3  of   Disease   The patient's father died from a myocardial infarction at age 20. The patient's mother died from lung problems at age 75. He had no brothers, one sister. There is no history of hemochromatosis in the family to his knowledge.   SOCIAL HISTORY: Eugene [name] has 2 children from his first marriage, Eugene [name], who is a Pharmacist, hospital in [place], with one adopted child, and Eugene [name], who works in [employer], and has one biologic and one adopted  child. Eugene [name]'s second wife, Eugene [name], who was my patient, had 2 children from her prior marriage, and Eugene [name]'s current wife, Eugene [name], also has 2 children of her own. Eugene AshingJim and [name] married in 2007. The patient attends a [employer].    ADVANCED DIRECTIVES: not in place   HEALTH MAINTENANCE: Social History   Tobacco Use  . Smoking status: Former Smoker    Types: Cigarettes    Quit date: 06/18/1999    Years since quitting: 19.9  . Smokeless tobacco: Never Used  Substance Use Topics  . Alcohol use: Yes    Alcohol/week:  1.0 - 2.0 standard drinks    Types: 1 - 2 Standard drinks or equivalent per week  . Drug use: No    Allergies  Allergen Reactions  . Chlorhexidine Gluconate Itching    Don't use Chloraprep inside port a cath kits.  . Tape     Adhesive tape,   Only use paper tape    Current Outpatient Medications  Medication Sig Dispense Refill  . ALPRAZolam (XANAX) 0.5 MG tablet Take 0.5 mg by mouth as needed. For anxiety    . amLODipine (NORVASC) 10 MG tablet TAKE 1 TABLET BY MOUTH EVERY DAY 90 tablet 1  . Ascorbic Acid (VITAMIN C) 100 MG tablet Take 100 mg by mouth.     Marland Kitchen. atorvastatin (LIPITOR) 20 MG tablet Take 20 mg by mouth daily.     Marland Kitchen. ELIQUIS 5 MG TABS tablet TAKE 1 TABLET BY MOUTH TWICE A DAY 60 tablet 5  . GAVILYTE-G 236 g solution See admin instructions.  0  . hydrochlorothiazide (HYDRODIURIL) 25 MG tablet Take 25 mg by mouth daily.    . hyoscyamine (LEVBID) 0.375 MG 12 hr tablet Take 0.375 mg by mouth as needed.     Marland Kitchen. KLOR-CON M20 20 MEQ tablet TAKE 1 TABLET BY MOUTH EVERY DAY 90 tablet 3  . LORazepam (ATIVAN) 0.5 MG tablet Take 0.5 mg by mouth at bedtime.     . metoprolol tartrate (LOPRESSOR) 25 MG tablet Take 25 mg by mouth 2 (two) times daily.     . ramipril (ALTACE) 5 MG capsule Take 5 mg by mouth daily.     Marland Kitchen. thiamine (VITAMIN B-1) 100 MG tablet Take 250 mg by mouth daily.    . vitamin E 100 UNIT capsule Take 100 Units by mouth daily.    Marland Kitchen. zolpidem (AMBIEN) 5 MG tablet Take 1 tablet (5 mg total) by mouth at bedtime as needed for sleep. May take 2 tabs ( 10mg  ) if needed. Call office so new script can be called in. 30 tablet 1   No current facility-administered medications for this visit.   Facility-Administered Medications Ordered in Other Visits  Medication Dose Route Frequency Provider Last Rate Last Admin  . alteplase (CATHFLO ACTIVASE) injection 2 mg  2 mg Intracatheter Once PRN Cledith Abdou, Valentino HueGustav C, MD      . sodium chloride 0.9 % injection 10 mL  10 mL Intravenous PRN Maykayla Highley,  Valentino HueGustav C, MD   10 mL at 11/25/14 1220    OBJECTIVE: Middle-aged white man in no acute distress Vitals:   05/14/19 1253  BP: 121/67  Pulse: 85  Resp: 17  Temp: 98.7 F (37.1 C)  SpO2: 100%     Body mass index is 26.8 kg/m.    ECOG FS: 1  Sclerae unicteric, EOMs intact Wearing a mask No cervical or supraclavicular adenopathy Lungs no rales or rhonchi Heart regular rate and rhythm Abd soft, nontender, positive  bowel sounds MSK no focal spinal tenderness, no upper extremity lymphedema Neuro: nonfocal, well oriented, appropriate affect   LAB RESULTS:  Lab Results  Component Value Date   FERRITIN 103 05/12/2019   FERRITIN 117 05/07/2019   FERRITIN 134 04/28/2019   FERRITIN 181 04/14/2019   FERRITIN 189 03/26/2019    Lab Results  Component Value Date   WBC 6.9 05/12/2019   NEUTROABS 4.8 05/12/2019   HGB 13.6 05/12/2019   HCT 39.2 05/12/2019   MCV 98.2 05/12/2019   PLT 204 05/12/2019      Chemistry      Component Value Date/Time   NA 131 (L) 03/26/2019 1234   NA 136 09/05/2017 1545   NA 135 (L) 01/30/2017 1145   K 4.4 03/26/2019 1234   K 3.5 01/30/2017 1145   CL 94 (L) 03/26/2019 1234   CO2 26 03/26/2019 1234   CO2 27 01/30/2017 1145   BUN 12 03/26/2019 1234   BUN 13 09/05/2017 1545   BUN 11.5 01/30/2017 1145   CREATININE 1.18 03/26/2019 1234   CREATININE 1.1 01/30/2017 1145      Component Value Date/Time   CALCIUM 10.1 03/26/2019 1234   CALCIUM 10.4 01/30/2017 1145   ALKPHOS 57 03/26/2019 1234   ALKPHOS 57 01/30/2017 1145   AST 16 03/26/2019 1234   AST 22 01/30/2017 1145   ALT 13 03/26/2019 1234   ALT 22 01/30/2017 1145   BILITOT 1.2 03/26/2019 1234   BILITOT 1.59 (H) 01/30/2017 1145       No results found for: LABCA2  No components found for: LABCA125  No results for input(s): INR in the last 168 hours.  Urinalysis    Component Value Date/Time   COLORURINE YELLOW 07/15/2007 1041   APPEARANCEUR CLEAR 07/15/2007 1041   LABSPEC 1.007  07/15/2007 1041   PHURINE 7.5 07/15/2007 1041   GLUCOSEU NEGATIVE 07/15/2007 1041   HGBUR TRACE (A) 07/15/2007 1041   BILIRUBINUR NEGATIVE 07/15/2007 1041   KETONESUR NEGATIVE 07/15/2007 1041   PROTEINUR NEGATIVE 07/15/2007 1041   UROBILINOGEN 0.2 07/15/2007 1041   NITRITE NEGATIVE 07/15/2007 1041   LEUKOCYTESUR NEGATIVE 07/15/2007 1041    STUDIES: No results found.   ASSESSMENT: 83 y.o. [place] man with a history of hemochromatosis diagnosed in 1989, treated with intermittent phlebotomies.   (1) also history of abdominal aortic aneurysm, status post stent placement  (2) history of irritable bowel syndrome, intermittently symptomatic  (3) atrial fibrillation, on apixaban  (4) poor access, requiring PICC during phlebotomies  PLAN:  Eugene [name] 's ferritin has dropped nicely, 06/28/2001 2 days ago.  I think he will need about 6 more phlebotomies before we can get his ferritin below 50.  He is willing to undergo them.  Currently we are using a peripheral vein.  We going slow but he does not mind and prefers to use the veins near the wrist as opposed to the ones in the band of the arm, which have been "scarred up" by prior phlebotomies  Once he completes the next 6 phlebotomies we will go back to checking his labs every 3 months and he will see me again in 1 year  I commended his exercise program.  We talked about fall risk issues.  He is hoping to receive the coronavirus vaccine as soon as it becomes available.  If we can assist with that of course I will be glad to help  Bladen Umar, Valentino Hue, MD  05/14/19 1:15 PM Medical Oncology and Hematology [facility] [address] [place], [place] [address] Tel. [phone]    Fax. [phone]    I, Mickie Bail, am acting as scribe for Dr. Valentino Hue. Ilse Billman.  I, Ruthann Cancer MD, have reviewed the above documentation for accuracy and completeness, and I agree with the above.

## 2019-05-14 ENCOUNTER — Inpatient Hospital Stay: Payer: Medicare Other

## 2019-05-14 ENCOUNTER — Other Ambulatory Visit: Payer: Self-pay

## 2019-05-14 ENCOUNTER — Inpatient Hospital Stay (HOSPITAL_BASED_OUTPATIENT_CLINIC_OR_DEPARTMENT_OTHER): Payer: Medicare Other | Admitting: Oncology

## 2019-05-14 ENCOUNTER — Telehealth: Payer: Self-pay | Admitting: Oncology

## 2019-05-14 DIAGNOSIS — I4821 Permanent atrial fibrillation: Secondary | ICD-10-CM | POA: Diagnosis not present

## 2019-05-14 DIAGNOSIS — I350 Nonrheumatic aortic (valve) stenosis: Secondary | ICD-10-CM | POA: Diagnosis not present

## 2019-05-14 NOTE — Telephone Encounter (Signed)
I left a message regarding schedule will mail 

## 2019-05-14 NOTE — Telephone Encounter (Signed)
I left a message regarding schedule  

## 2019-05-26 ENCOUNTER — Other Ambulatory Visit: Payer: Self-pay | Admitting: Internal Medicine

## 2019-05-26 ENCOUNTER — Ambulatory Visit
Admission: RE | Admit: 2019-05-26 | Discharge: 2019-05-26 | Disposition: A | Discharge status: Home / Self Care | Payer: Medicare Other | Source: Ambulatory Visit | Attending: Internal Medicine | Admitting: Internal Medicine

## 2019-05-26 DIAGNOSIS — R0781 Pleurodynia: Secondary | ICD-10-CM

## 2019-06-03 ENCOUNTER — Telehealth: Payer: Self-pay | Admitting: *Deleted

## 2019-06-03 ENCOUNTER — Inpatient Hospital Stay: Payer: Medicare Other | Attending: Oncology

## 2019-06-03 ENCOUNTER — Other Ambulatory Visit: Payer: Self-pay

## 2019-06-03 ENCOUNTER — Inpatient Hospital Stay: Payer: Medicare Other

## 2019-06-03 VITALS — BP 119/74 | HR 74 | Temp 98.3°F | Resp 16

## 2019-06-03 DIAGNOSIS — Z452 Encounter for adjustment and management of vascular access device: Secondary | ICD-10-CM

## 2019-06-03 LAB — CBC WITH DIFFERENTIAL/PLATELET
Abs Immature Granulocytes: 0.04 10*3/uL (ref 0.00–0.07)
Basophils Absolute: 0 10*3/uL (ref 0.0–0.1)
Basophils Relative: 0 %
Eosinophils Absolute: 0.2 10*3/uL (ref 0.0–0.5)
Eosinophils Relative: 2 %
HCT: 40.5 % (ref 39.0–52.0)
Hemoglobin: 14.4 g/dL (ref 13.0–17.0)
Immature Granulocytes: 1 %
Lymphocytes Relative: 17 %
Lymphs Abs: 1.2 10*3/uL (ref 0.7–4.0)
MCH: 34.6 pg — ABNORMAL HIGH (ref 26.0–34.0)
MCHC: 35.6 g/dL (ref 30.0–36.0)
MCV: 97.4 fL (ref 80.0–100.0)
Monocytes Absolute: 0.6 10*3/uL (ref 0.1–1.0)
Monocytes Relative: 9 %
Neutro Abs: 5.3 10*3/uL (ref 1.7–7.7)
Neutrophils Relative %: 71 %
Platelets: 252 10*3/uL (ref 150–400)
RBC: 4.16 MIL/uL — ABNORMAL LOW (ref 4.22–5.81)
RDW: 12.9 % (ref 11.5–15.5)
WBC: 7.4 10*3/uL (ref 4.0–10.5)
nRBC: 0 % (ref 0.0–0.2)

## 2019-06-03 LAB — COMPREHENSIVE METABOLIC PANEL
ALT: 16 U/L (ref 0–44)
AST: 17 U/L (ref 15–41)
Albumin: 4.3 g/dL (ref 3.5–5.0)
Alkaline Phosphatase: 83 U/L (ref 38–126)
Anion gap: 13 (ref 5–15)
BUN: 10 mg/dL (ref 8–23)
CO2: 27 mmol/L (ref 22–32)
Calcium: 10.1 mg/dL (ref 8.9–10.3)
Chloride: 95 mmol/L — ABNORMAL LOW (ref 98–111)
Creatinine, Ser: 1.25 mg/dL — ABNORMAL HIGH (ref 0.61–1.24)
GFR calc Af Amer: >60 mL/min (ref >60)
GFR calc non Af Amer: 54 mL/min — ABNORMAL LOW (ref >60)
Glucose, Bld: 77 mg/dL (ref 70–99)
Potassium: 3.5 mmol/L (ref 3.5–5.1)
Sodium: 135 mmol/L (ref 135–145)
Total Bilirubin: 1.3 mg/dL — ABNORMAL HIGH (ref 0.3–1.2)
Total Protein: 7.2 g/dL (ref 6.5–8.1)

## 2019-06-03 LAB — IRON AND TIBC
Iron: 113 ug/dL (ref 42–163)
Saturation Ratios: 43 % (ref 20–55)
TIBC: 263 ug/dL (ref 202–409)
UIBC: 149 ug/dL (ref 117–376)

## 2019-06-03 LAB — FERRITIN: Ferritin: 79 ng/mL (ref 24–336)

## 2019-06-03 NOTE — Patient Instructions (Signed)
Therapeutic Phlebotomy Therapeutic phlebotomy is the planned removal of blood from a person's body for the purpose of treating a medical condition. The procedure is similar to donating blood. Usually, about a pint (470 mL, or 0.47 L) of blood is removed. The average adult has 9-12 pints (4.3-5.7 L) of blood in the body. Therapeutic phlebotomy may be used to treat the following medical conditions:  Hemochromatosis. This is a condition in which the blood contains too much iron.  Polycythemia vera. This is a condition in which the blood contains too many red blood cells.  Porphyria cutanea tarda. This is a disease in which an important part of hemoglobin is not made properly. It results in the buildup of abnormal amounts of porphyrins in the body.  Sickle cell disease. This is a condition in which the red blood cells form an abnormal crescent shape rather than a round shape. Tell a health care provider about:  Any allergies you have.  All medicines you are taking, including vitamins, herbs, eye drops, creams, and over-the-counter medicines.  Any problems you or family members have had with anesthetic medicines.  Any blood disorders you have.  Any surgeries you have had.  Any medical conditions you have.  Whether you are pregnant or may be pregnant. What are the risks? Generally, this is a safe procedure. However, problems may occur, including:  Nausea or light-headedness.  Low blood pressure (hypotension).  Soreness, bleeding, swelling, or bruising at the needle insertion site.  Infection. What happens before the procedure?  Follow instructions from your health care provider about eating or drinking restrictions.  Ask your health care provider about: ? Changing or stopping your regular medicines. This is especially important if you are taking diabetes medicines or blood thinners (anticoagulants). ? Taking medicines such as aspirin and ibuprofen. These medicines can thin your  blood. Do not take these medicines unless your health care provider tells you to take them. ? Taking over-the-counter medicines, vitamins, herbs, and supplements.  Wear clothing with sleeves that can be raised above the elbow.  Plan to have someone take you home from the hospital or clinic.  You may have a blood sample taken.  Your blood pressure, pulse rate, and breathing rate will be measured. What happens during the procedure?   To lower your risk of infection: ? Your health care team will wash or sanitize their hands. ? Your skin will be cleaned with an antiseptic.  You may be given a medicine to numb the area (local anesthetic).  A tourniquet will be placed on your arm.  A needle will be inserted into one of your veins.  Tubing and a collection bag will be attached to that needle.  Blood will flow through the needle and tubing into the collection bag.  The collection bag will be placed lower than your arm to allow gravity to help the flow of blood into the bag.  You may be asked to open and close your hand slowly and continually during the entire collection.  After the specified amount of blood has been removed from your body, the collection bag and tubing will be clamped.  The needle will be removed from your vein.  Pressure will be held on the site of the needle insertion to stop the bleeding.  A bandage (dressing) will be placed over the needle insertion site. The procedure may vary among health care providers and hospitals. What happens after the procedure?  Your blood pressure, pulse rate, and breathing rate will be   measured after the procedure.  You will be encouraged to drink fluids.  Your recovery will be assessed and monitored.  You can return to your normal activities as told by your health care provider. Summary  Therapeutic phlebotomy is the planned removal of blood from a person's body for the purpose of treating a medical condition.  Therapeutic  phlebotomy may be used to treat hemochromatosis, polycythemia vera, porphyria cutanea tarda, or sickle cell disease.  In the procedure, a needle is inserted and about a pint (470 mL, or 0.47 L) of blood is removed. The average adult has 9-12 pints (4.3-5.7 L) of blood in the body.  This is generally a safe procedure, but it can sometimes cause problems such as nausea, light-headedness, or low blood pressure (hypotension). This information is not intended to replace advice given to you by your health care provider. Make sure you discuss any questions you have with your health care provider. Document Revised: 05/30/2017 Document Reviewed: 05/30/2017 Elsevier Patient Education  2020 Elsevier Inc.  

## 2019-06-03 NOTE — Telephone Encounter (Signed)
Per MD - use lab values from prior Phlebotomy appt for procedure today.  Obtain labs with Phlebotomy today and do not wait for results to procced procedure today.

## 2019-06-16 ENCOUNTER — Inpatient Hospital Stay: Payer: Medicare Other

## 2019-06-16 ENCOUNTER — Other Ambulatory Visit: Payer: Self-pay

## 2019-06-16 LAB — CBC WITH DIFFERENTIAL/PLATELET
Abs Immature Granulocytes: 0.03 10*3/uL (ref 0.00–0.07)
Basophils Absolute: 0 10*3/uL (ref 0.0–0.1)
Basophils Relative: 0 %
Eosinophils Absolute: 0.3 10*3/uL (ref 0.0–0.5)
Eosinophils Relative: 4 %
HCT: 40.1 % (ref 39.0–52.0)
Hemoglobin: 14.1 g/dL (ref 13.0–17.0)
Immature Granulocytes: 0 %
Lymphocytes Relative: 17 %
Lymphs Abs: 1.2 10*3/uL (ref 0.7–4.0)
MCH: 33.4 pg (ref 26.0–34.0)
MCHC: 35.2 g/dL (ref 30.0–36.0)
MCV: 95 fL (ref 80.0–100.0)
Monocytes Absolute: 0.7 10*3/uL (ref 0.1–1.0)
Monocytes Relative: 10 %
Neutro Abs: 4.8 10*3/uL (ref 1.7–7.7)
Neutrophils Relative %: 69 %
Platelets: 208 10*3/uL (ref 150–400)
RBC: 4.22 MIL/uL (ref 4.22–5.81)
RDW: 12.4 % (ref 11.5–15.5)
WBC: 7.1 10*3/uL (ref 4.0–10.5)
nRBC: 0 % (ref 0.0–0.2)

## 2019-06-16 LAB — IRON AND TIBC
Iron: 117 ug/dL (ref 42–163)
Saturation Ratios: 48 % (ref 20–55)
TIBC: 245 ug/dL (ref 202–409)
UIBC: 128 ug/dL (ref 117–376)

## 2019-06-16 LAB — FERRITIN: Ferritin: 55 ng/mL (ref 24–336)

## 2019-06-16 NOTE — Progress Notes (Signed)
507g removed during phlebotomy appt. Pt tolerated well. 18G used in R wrist, per pt request.

## 2019-06-16 NOTE — Patient Instructions (Signed)
Therapeutic Phlebotomy Therapeutic phlebotomy is the planned removal of blood from a person's body for the purpose of treating a medical condition. The procedure is similar to donating blood. Usually, about a pint (470 mL, or 0.47 L) of blood is removed. The average adult has 9-12 pints (4.3-5.7 L) of blood in the body. Therapeutic phlebotomy may be used to treat the following medical conditions:  Hemochromatosis. This is a condition in which the blood contains too much iron.  Polycythemia vera. This is a condition in which the blood contains too many red blood cells.  Porphyria cutanea tarda. This is a disease in which an important part of hemoglobin is not made properly. It results in the buildup of abnormal amounts of porphyrins in the body.  Sickle cell disease. This is a condition in which the red blood cells form an abnormal crescent shape rather than a round shape. Tell a health care provider about:  Any allergies you have.  All medicines you are taking, including vitamins, herbs, eye drops, creams, and over-the-counter medicines.  Any problems you or family members have had with anesthetic medicines.  Any blood disorders you have.  Any surgeries you have had.  Any medical conditions you have.  Whether you are pregnant or may be pregnant. What are the risks? Generally, this is a safe procedure. However, problems may occur, including:  Nausea or light-headedness.  Low blood pressure (hypotension).  Soreness, bleeding, swelling, or bruising at the needle insertion site.  Infection. What happens before the procedure?  Follow instructions from your health care provider about eating or drinking restrictions.  Ask your health care provider about: ? Changing or stopping your regular medicines. This is especially important if you are taking diabetes medicines or blood thinners (anticoagulants). ? Taking medicines such as aspirin and ibuprofen. These medicines can thin your  blood. Do not take these medicines unless your health care provider tells you to take them. ? Taking over-the-counter medicines, vitamins, herbs, and supplements.  Wear clothing with sleeves that can be raised above the elbow.  Plan to have someone take you home from the hospital or clinic.  You may have a blood sample taken.  Your blood pressure, pulse rate, and breathing rate will be measured. What happens during the procedure?   To lower your risk of infection: ? Your health care team will wash or sanitize their hands. ? Your skin will be cleaned with an antiseptic.  You may be given a medicine to numb the area (local anesthetic).  A tourniquet will be placed on your arm.  A needle will be inserted into one of your veins.  Tubing and a collection bag will be attached to that needle.  Blood will flow through the needle and tubing into the collection bag.  The collection bag will be placed lower than your arm to allow gravity to help the flow of blood into the bag.  You may be asked to open and close your hand slowly and continually during the entire collection.  After the specified amount of blood has been removed from your body, the collection bag and tubing will be clamped.  The needle will be removed from your vein.  Pressure will be held on the site of the needle insertion to stop the bleeding.  A bandage (dressing) will be placed over the needle insertion site. The procedure may vary among health care providers and hospitals. What happens after the procedure?  Your blood pressure, pulse rate, and breathing rate will be   measured after the procedure.  You will be encouraged to drink fluids.  Your recovery will be assessed and monitored.  You can return to your normal activities as told by your health care provider. Summary  Therapeutic phlebotomy is the planned removal of blood from a person's body for the purpose of treating a medical condition.  Therapeutic  phlebotomy may be used to treat hemochromatosis, polycythemia vera, porphyria cutanea tarda, or sickle cell disease.  In the procedure, a needle is inserted and about a pint (470 mL, or 0.47 L) of blood is removed. The average adult has 9-12 pints (4.3-5.7 L) of blood in the body.  This is generally a safe procedure, but it can sometimes cause problems such as nausea, light-headedness, or low blood pressure (hypotension). This information is not intended to replace advice given to you by your health care provider. Make sure you discuss any questions you have with your health care provider. Document Revised: 05/30/2017 Document Reviewed: 05/30/2017 Elsevier Patient Education  2020 Elsevier Inc.  

## 2019-06-17 ENCOUNTER — Telehealth: Payer: Self-pay | Admitting: Oncology

## 2019-06-17 ENCOUNTER — Telehealth: Payer: Self-pay | Admitting: *Deleted

## 2019-06-17 ENCOUNTER — Other Ambulatory Visit: Payer: Self-pay | Admitting: Cardiology

## 2019-06-17 NOTE — Telephone Encounter (Signed)
No entry 

## 2019-06-17 NOTE — Telephone Encounter (Signed)
Pt last saw Dr Mayford Knife 03/12/19, last labs 06/03/19 Creat 1.25, age 84, weight 92.1kg, based on specified criteria pt is on appropriate dosage of Eliquis 5mg  BID.  Will refill rx.

## 2019-06-17 NOTE — Telephone Encounter (Signed)
Scheduled appt per 1/20 sch message - unable to reach pt . Left message with appt date and time   

## 2019-06-17 NOTE — Telephone Encounter (Signed)
Per lab draw yesterday with start of phlebotomy ferritin was 55.  Per MD review- pt has reached appropriate level for stopping phlebotomies at this time.  Informed pt.  Further lab and phleb appointments cancelled with request for lab check only in April of 2021.

## 2019-06-24 ENCOUNTER — Ambulatory Visit: Payer: Medicare Other

## 2019-06-30 ENCOUNTER — Ambulatory Visit: Payer: Medicare Other

## 2019-06-30 ENCOUNTER — Other Ambulatory Visit: Payer: Medicare Other

## 2019-07-03 ENCOUNTER — Ambulatory Visit: Payer: Medicare Other | Attending: Internal Medicine

## 2019-07-03 DIAGNOSIS — Z23 Encounter for immunization: Secondary | ICD-10-CM

## 2019-07-03 NOTE — Progress Notes (Signed)
   Covid-19 Vaccination Clinic  Name:  Eugene [name]    MRN: [id number] DOB: Mar 06, 1936  07/03/2019  [name] was observed post Covid-19 immunization for 15 minutes without incidence. He was provided with Vaccine Information Sheet and instruction to access the V-Safe system.   [name] was instructed to call 911 with any severe reactions post vaccine: Marland Kitchen Difficulty breathing  . Swelling of your face and throat  . A fast heartbeat  . A bad rash all over your body  . Dizziness and weakness    Immunizations Administered    Name Date Dose VIS Date Route   Pfizer COVID-19 Vaccine 07/03/2019  8:47 AM 0.3 mL 05/08/2019 Intramuscular   Manufacturer: [employer], [employer]   Lot: [id number]   NDC: [address]-2

## 2019-07-14 ENCOUNTER — Other Ambulatory Visit: Payer: Medicare Other

## 2019-07-14 ENCOUNTER — Ambulatory Visit: Payer: Medicare Other

## 2019-07-28 ENCOUNTER — Ambulatory Visit: Payer: Medicare Other

## 2019-07-28 ENCOUNTER — Ambulatory Visit: Payer: Medicare Other | Attending: Internal Medicine

## 2019-07-28 ENCOUNTER — Other Ambulatory Visit: Payer: Medicare Other

## 2019-07-28 DIAGNOSIS — Z23 Encounter for immunization: Secondary | ICD-10-CM

## 2019-07-28 NOTE — Progress Notes (Signed)
   Covid-19 Vaccination Clinic  Name:  Eugene [name]    MRN: [id number] DOB: [birth year]  07/28/2019  Eugene [name] was observed post Covid-19 immunization for 15 minutes without incident. He was provided with Vaccine Information Sheet and instruction to access the V-Safe system.   Eugene [name] was instructed to call 911 with any severe reactions post vaccine: Marland Kitchen Difficulty breathing  . Swelling of face and throat  . A fast heartbeat  . A bad rash all over body  . Dizziness and weakness   Immunizations Administered    Name Date Dose VIS Date Route   Pfizer COVID-19 Vaccine 07/28/2019  9:39 AM 0.3 mL 05/08/2019 Intramuscular   Manufacturer: [employer], [employer]   Lot: [id number]   NDC: [address]-3

## 2019-08-11 ENCOUNTER — Ambulatory Visit: Payer: Medicare Other

## 2019-08-11 ENCOUNTER — Other Ambulatory Visit: Payer: Medicare Other

## 2019-09-10 ENCOUNTER — Inpatient Hospital Stay: Payer: Medicare Other | Attending: Oncology

## 2019-09-10 ENCOUNTER — Other Ambulatory Visit: Payer: Self-pay

## 2019-09-10 LAB — COMPREHENSIVE METABOLIC PANEL
ALT: 19 U/L (ref 0–44)
AST: 22 U/L (ref 15–41)
Albumin: 4.1 g/dL (ref 3.5–5.0)
Alkaline Phosphatase: 51 U/L (ref 38–126)
Anion gap: 14 (ref 5–15)
BUN: 19 mg/dL (ref 8–23)
CO2: 24 mmol/L (ref 22–32)
Calcium: 10.3 mg/dL (ref 8.9–10.3)
Chloride: 96 mmol/L — ABNORMAL LOW (ref 98–111)
Creatinine, Ser: 1.28 mg/dL — ABNORMAL HIGH (ref 0.61–1.24)
GFR calc Af Amer: >60 mL/min (ref >60)
GFR calc non Af Amer: 52 mL/min — ABNORMAL LOW (ref >60)
Glucose, Bld: 90 mg/dL (ref 70–99)
Potassium: 4.1 mmol/L (ref 3.5–5.1)
Sodium: 134 mmol/L — ABNORMAL LOW (ref 135–145)
Total Bilirubin: 1.2 mg/dL (ref 0.3–1.2)
Total Protein: 7.1 g/dL (ref 6.5–8.1)

## 2019-09-10 LAB — CBC WITH DIFFERENTIAL/PLATELET
Abs Immature Granulocytes: 0.06 10*3/uL (ref 0.00–0.07)
Basophils Absolute: 0 10*3/uL (ref 0.0–0.1)
Basophils Relative: 1 %
Eosinophils Absolute: 0.1 10*3/uL (ref 0.0–0.5)
Eosinophils Relative: 1 %
HCT: 43 % (ref 39.0–52.0)
Hemoglobin: 14.9 g/dL (ref 13.0–17.0)
Immature Granulocytes: 1 %
Lymphocytes Relative: 15 %
Lymphs Abs: 1.3 10*3/uL (ref 0.7–4.0)
MCH: 31.8 pg (ref 26.0–34.0)
MCHC: 34.7 g/dL (ref 30.0–36.0)
MCV: 91.7 fL (ref 80.0–100.0)
Monocytes Absolute: 0.7 10*3/uL (ref 0.1–1.0)
Monocytes Relative: 8 %
Neutro Abs: 6.3 10*3/uL (ref 1.7–7.7)
Neutrophils Relative %: 74 %
Platelets: 255 10*3/uL (ref 150–400)
RBC: 4.69 MIL/uL (ref 4.22–5.81)
RDW: 13.5 % (ref 11.5–15.5)
WBC: 8.5 10*3/uL (ref 4.0–10.5)
nRBC: 0 % (ref 0.0–0.2)

## 2019-09-10 LAB — IRON AND TIBC
Iron: 159 ug/dL (ref 42–163)
Saturation Ratios: 58 % — ABNORMAL HIGH (ref 20–55)
TIBC: 274 ug/dL (ref 202–409)
UIBC: 115 ug/dL — ABNORMAL LOW (ref 117–376)

## 2019-09-10 LAB — FERRITIN: Ferritin: 78 ng/mL (ref 24–336)

## 2019-09-11 ENCOUNTER — Telehealth: Payer: Self-pay

## 2019-09-11 NOTE — Telephone Encounter (Signed)
RN returned call, voicemail left for return call.  

## 2019-11-11 ENCOUNTER — Inpatient Hospital Stay: Payer: Medicare Other | Attending: Oncology

## 2019-11-11 ENCOUNTER — Other Ambulatory Visit: Payer: Self-pay

## 2019-11-11 LAB — CBC WITH DIFFERENTIAL/PLATELET
Abs Immature Granulocytes: 0.04 10*3/uL (ref 0.00–0.07)
Basophils Absolute: 0 10*3/uL (ref 0.0–0.1)
Basophils Relative: 0 %
Eosinophils Absolute: 0.2 10*3/uL (ref 0.0–0.5)
Eosinophils Relative: 3 %
HCT: 40 % (ref 39.0–52.0)
Hemoglobin: 14.3 g/dL (ref 13.0–17.0)
Immature Granulocytes: 1 %
Lymphocytes Relative: 17 %
Lymphs Abs: 1.2 10*3/uL (ref 0.7–4.0)
MCH: 33.6 pg (ref 26.0–34.0)
MCHC: 35.8 g/dL (ref 30.0–36.0)
MCV: 94.1 fL (ref 80.0–100.0)
Monocytes Absolute: 0.6 10*3/uL (ref 0.1–1.0)
Monocytes Relative: 9 %
Neutro Abs: 4.9 10*3/uL (ref 1.7–7.7)
Neutrophils Relative %: 70 %
Platelets: 196 10*3/uL (ref 150–400)
RBC: 4.25 MIL/uL (ref 4.22–5.81)
RDW: 13.5 % (ref 11.5–15.5)
WBC: 7 10*3/uL (ref 4.0–10.5)
nRBC: 0 % (ref 0.0–0.2)

## 2019-11-11 LAB — FERRITIN: Ferritin: 66 ng/mL (ref 24–336)

## 2019-11-11 LAB — COMPREHENSIVE METABOLIC PANEL
ALT: 20 U/L (ref 0–44)
AST: 21 U/L (ref 15–41)
Albumin: 4.1 g/dL (ref 3.5–5.0)
Alkaline Phosphatase: 55 U/L (ref 38–126)
Anion gap: 11 (ref 5–15)
BUN: 17 mg/dL (ref 8–23)
CO2: 25 mmol/L (ref 22–32)
Calcium: 10.3 mg/dL (ref 8.9–10.3)
Chloride: 95 mmol/L — ABNORMAL LOW (ref 98–111)
Creatinine, Ser: 1.28 mg/dL — ABNORMAL HIGH (ref 0.61–1.24)
GFR calc Af Amer: >60 mL/min (ref >60)
GFR calc non Af Amer: 52 mL/min — ABNORMAL LOW (ref >60)
Glucose, Bld: 102 mg/dL — ABNORMAL HIGH (ref 70–99)
Potassium: 4.1 mmol/L (ref 3.5–5.1)
Sodium: 131 mmol/L — ABNORMAL LOW (ref 135–145)
Total Bilirubin: 1.4 mg/dL — ABNORMAL HIGH (ref 0.3–1.2)
Total Protein: 6.8 g/dL (ref 6.5–8.1)

## 2019-11-11 LAB — IRON AND TIBC
Iron: 167 ug/dL — ABNORMAL HIGH (ref 42–163)
Saturation Ratios: 58 % — ABNORMAL HIGH (ref 20–55)
TIBC: 286 ug/dL (ref 202–409)
UIBC: 119 ug/dL (ref 117–376)

## 2019-12-26 ENCOUNTER — Other Ambulatory Visit: Payer: Self-pay | Admitting: Cardiology

## 2019-12-28 NOTE — Telephone Encounter (Signed)
Age 84, weight 92kg, SCr 1.28 on 11/11/19, last OV Oct 2020, afib indication

## 2020-02-11 ENCOUNTER — Other Ambulatory Visit: Payer: Self-pay

## 2020-02-11 ENCOUNTER — Inpatient Hospital Stay: Payer: Medicare Other | Attending: Oncology

## 2020-02-11 LAB — CBC WITH DIFFERENTIAL/PLATELET
Abs Immature Granulocytes: 0.03 10*3/uL (ref 0.00–0.07)
Basophils Absolute: 0 10*3/uL (ref 0.0–0.1)
Basophils Relative: 0 %
Eosinophils Absolute: 0.1 10*3/uL (ref 0.0–0.5)
Eosinophils Relative: 2 %
HCT: 40.1 % (ref 39.0–52.0)
Hemoglobin: 14.3 g/dL (ref 13.0–17.0)
Immature Granulocytes: 0 %
Lymphocytes Relative: 19 %
Lymphs Abs: 1.3 10*3/uL (ref 0.7–4.0)
MCH: 33.6 pg (ref 26.0–34.0)
MCHC: 35.7 g/dL (ref 30.0–36.0)
MCV: 94.1 fL (ref 80.0–100.0)
Monocytes Absolute: 0.7 10*3/uL (ref 0.1–1.0)
Monocytes Relative: 10 %
Neutro Abs: 4.7 10*3/uL (ref 1.7–7.7)
Neutrophils Relative %: 69 %
Platelets: 200 10*3/uL (ref 150–400)
RBC: 4.26 MIL/uL (ref 4.22–5.81)
RDW: 12.7 % (ref 11.5–15.5)
WBC: 6.8 10*3/uL (ref 4.0–10.5)
nRBC: 0 % (ref 0.0–0.2)

## 2020-02-11 LAB — COMPREHENSIVE METABOLIC PANEL
ALT: 20 U/L (ref 0–44)
AST: 24 U/L (ref 15–41)
Albumin: 4.2 g/dL (ref 3.5–5.0)
Alkaline Phosphatase: 55 U/L (ref 38–126)
Anion gap: 10 (ref 5–15)
BUN: 20 mg/dL (ref 8–23)
CO2: 28 mmol/L (ref 22–32)
Calcium: 10.3 mg/dL (ref 8.9–10.3)
Chloride: 89 mmol/L — ABNORMAL LOW (ref 98–111)
Creatinine, Ser: 1.19 mg/dL (ref 0.61–1.24)
GFR calc Af Amer: >60 mL/min (ref >60)
GFR calc non Af Amer: 57 mL/min — ABNORMAL LOW (ref >60)
Glucose, Bld: 102 mg/dL — ABNORMAL HIGH (ref 70–99)
Potassium: 3.7 mmol/L (ref 3.5–5.1)
Sodium: 127 mmol/L — ABNORMAL LOW (ref 135–145)
Total Bilirubin: 1.9 mg/dL — ABNORMAL HIGH (ref 0.3–1.2)
Total Protein: 7.2 g/dL (ref 6.5–8.1)

## 2020-02-11 LAB — IRON AND TIBC
Iron: 225 ug/dL — ABNORMAL HIGH (ref 42–163)
Saturation Ratios: 92 % — ABNORMAL HIGH (ref 20–55)
TIBC: 244 ug/dL (ref 202–409)
UIBC: 19 ug/dL — ABNORMAL LOW (ref 117–376)

## 2020-02-11 LAB — FERRITIN: Ferritin: 113 ng/mL (ref 24–336)

## 2020-03-01 ENCOUNTER — Other Ambulatory Visit: Payer: Self-pay | Admitting: Cardiology

## 2020-03-15 ENCOUNTER — Encounter: Payer: Self-pay | Admitting: Cardiology

## 2020-03-15 ENCOUNTER — Ambulatory Visit (INDEPENDENT_AMBULATORY_CARE_PROVIDER_SITE_OTHER): Payer: Medicare Other | Admitting: Cardiology

## 2020-03-15 ENCOUNTER — Other Ambulatory Visit: Payer: Self-pay

## 2020-03-15 VITALS — BP 118/70 | HR 80 | Ht 73.0 in | Wt 195.8 lb

## 2020-03-15 DIAGNOSIS — I4821 Permanent atrial fibrillation: Secondary | ICD-10-CM

## 2020-03-15 DIAGNOSIS — I10 Essential (primary) hypertension: Secondary | ICD-10-CM

## 2020-03-15 DIAGNOSIS — I071 Rheumatic tricuspid insufficiency: Secondary | ICD-10-CM | POA: Diagnosis not present

## 2020-03-15 DIAGNOSIS — I350 Nonrheumatic aortic (valve) stenosis: Secondary | ICD-10-CM | POA: Diagnosis not present

## 2020-03-15 NOTE — Patient Instructions (Signed)

## 2020-03-15 NOTE — Progress Notes (Signed)
Cardiology Office Note:    Date:  03/15/2020   ID:  Eugene [name], DOB 10/10/1935, MRN [id number]  PCP:  [name], MD  Cardiologist:  No primary care provider on file.    Referring MD: [name], MD   Chief Complaint  Patient presents with  . Hypertension  . Atrial Fibrillation    History of Present Illness:    Eugene [name] is a 84 y.o. male with a hx of HTN,permanentatrial fibrillation s/p failed DCCV x2 on chronic anticoagulation.  He is here today for followup and is doing well.  He denies any chest pain or pressure, SOB, DOE, PND, orthopnea, LE edema, dizziness, palpitations or syncope. He is compliant with his meds and is tolerating meds with no SE.    Past Medical History:  Diagnosis Date  . AAA (abdominal aortic aneurysm) (HCC) 06/29/2007   stent graft  . Aortic stenosis 09/05/2017   Mild AS/AR by echo 01/2017.  Marland Kitchen GERD (gastroesophageal reflux disease)   . Hemochromatosis   . Hyperlipidemia   . Hypertension   . Irritable bowel syndrome   . Permanent atrial fibrillation (HCC)   . Stroke [facility])    Transient Ischemic Attack    Past Surgical History:  Procedure Laterality Date  . ABDOMINAL AORTIC ANEURYSM REPAIR    . ENDOVASCULAR STENT INSERTION  07/18/2007   saccular infrarenal aortic aneurysm  . EYE SURGERY Bilateral    Cataract    . IR FLUORO GUIDE CV LINE LEFT  12/08/2018  . IR FLUORO GUIDE CV LINE RIGHT  02/13/2017  . IR FLUORO GUIDE CV LINE RIGHT  02/25/2017  . IR US GUIDE VASC ACCESS RIGHT  02/13/2017  . PICC LINE PLACE PERIPHERAL ([facility] HX)  Oct 21, 2014    Current Medications: Current Meds  Medication Sig  . ALPRAZolam (XANAX) 0.5 MG tablet Take 0.5 mg by mouth as needed. For anxiety  . amLODipine (NORVASC) 10 MG tablet TAKE 1 TABLET BY MOUTH EVERY DAY  . Ascorbic Acid (VITAMIN C) 100 MG tablet Take 100 mg by mouth.   Marland Kitchen atorvastatin (LIPITOR) 20 MG tablet Take 20 mg by mouth daily.   . Cholecalciferol (VITAMIN D3) 25 MCG (1000 UT)  CAPS Take by mouth daily.  Marland Kitchen ELIQUIS 5 MG TABS tablet TAKE 1 TABLET BY MOUTH TWICE A DAY  . hydrochlorothiazide (HYDRODIURIL) 25 MG tablet Take 25 mg by mouth daily.  . hyoscyamine (LEVBID) 0.375 MG 12 hr tablet Take 0.375 mg by mouth as needed.   . Loperamide HCl (IMODIUM PO) Take by mouth as needed.  . metoprolol tartrate (LOPRESSOR) 25 MG tablet Take 25 mg by mouth 2 (two) times daily.   Marland Kitchen omeprazole (PRILOSEC OTC) 20 MG tablet Take 20 mg by mouth daily.  . potassium chloride SA (KLOR-CON M20) 20 MEQ tablet Take 1 tablet (20 mEq total) by mouth daily. Please keep upcoming appt in October with Dr. Mayford Knife before anymore refills. Thank you  . ramipril (ALTACE) 5 MG capsule Take 5 mg by mouth daily.   Marland Kitchen thiamine (VITAMIN B-1) 100 MG tablet Take 250 mg by mouth daily.  . vitamin E 100 UNIT capsule Take 100 Units by mouth daily.     Allergies:   Chlorhexidine gluconate and Tape   Social History   Socioeconomic History  . Marital status: Married    Spouse name: Not on file  . Number of children: Not on file  . Years of education: Not on file  . Highest education level: Not  on file  Occupational History  . Not on file  Tobacco Use  . Smoking status: Former Smoker    Types: Cigarettes    Quit date: 06/18/1999    Years since quitting: 20.7  . Smokeless tobacco: Never Used  Substance and Sexual Activity  . Alcohol use: Yes    Alcohol/week: 1.0 - 2.0 standard drink    Types: 1 - 2 Standard drinks or equivalent per week  . Drug use: No  . Sexual activity: Not on file  Other Topics Concern  . Not on file  Social History Narrative  . Not on file   Social Determinants of Health   Financial Resource Strain:   . Difficulty of Paying Living Expenses: Not on file  Food Insecurity:   . Worried About Programme researcher, broadcasting/film/video in the Last Year: Not on file  . Ran Out of Food in the Last Year: Not on file  Transportation Needs:   . Lack of Transportation (Medical): Not on file  . Lack of  Transportation (Non-Medical): Not on file  Physical Activity:   . Days of Exercise per Week: Not on file  . Minutes of Exercise per Session: Not on file  Stress:   . Feeling of Stress : Not on file  Social Connections:   . Frequency of Communication with Friends and Family: Not on file  . Frequency of Social Gatherings with Friends and Family: Not on file  . Attends Religious Services: Not on file  . Active Member of Clubs or Organizations: Not on file  . Attends Banker Meetings: Not on file  . Marital Status: Not on file     Family History: The patient's family history includes Heart disease in his father and mother; Hypertension in his mother.  ROS:   Please see the history of present illness.    ROS  All other systems reviewed and negative.   EKGs/Labs/Other Studies Reviewed:    The following studies were reviewed today: none  EKG:  EKG is ordered today and showed atrial fibrillation at 80bpm with   Recent Labs: 02/11/2020: ALT 20; BUN 20; Creatinine, Ser 1.19; Hemoglobin 14.3; Platelets 200; Potassium 3.7; Sodium 127   Recent Lipid Panel No results found for: CHOL, TRIG, HDL, CHOLHDL, VLDL, LDLCALC, LDLDIRECT  Physical Exam:    VS:  BP 118/70   Pulse 80   Ht 6\' 1"  (1.854 m)   Wt 195 lb 12.8 oz (88.8 kg)   SpO2 98%   BMI 25.83 kg/m     Wt Readings from Last 3 Encounters:  03/15/20 195 lb 12.8 oz (88.8 kg)  05/14/19 203 lb 1.6 oz (92.1 kg)  03/12/19 206 lb (93.4 kg)     GEN: Well nourished, well developed in no acute distress HEENT: Normal NECK: No JVD; No carotid bruits LYMPHATICS: No lymphadenopathy CARDIAC:irregularly irregular, no murmurs, rubs, gallops RESPIRATORY:  Clear to auscultation without rales, wheezing or rhonchi  ABDOMEN: Soft, non-tender, non-distended MUSCULOSKELETAL:  No edema; No deformity  SKIN: Warm and dry NEUROLOGIC:  Alert and oriented x 3 PSYCHIATRIC:  Normal affect    ASSESSMENT:    1. Permanent atrial  fibrillation (HCC)   2. Benign essential HTN   3. Nonrheumatic aortic valve stenosis   4. Tricuspid valve insufficiency, unspecified etiology    PLAN:    In order of problems listed above:  1.  Permanent atrial fibrillation -HR controlled on exam today -he has not had any bleeding problems on DOAC -continue Eliquis 5mg   BID and Lopressor 25mg  BID -SCr stable at 1.19 in Sept 2021   2.  HTN -Bp well controlled on exam today -continue amlodipine 10mg  daily, HCTZ 25mg  daily, ramipril 5mg  daily and Lopressor 25mg  BID  3.  Aortic stenosis/regurgitation -2D echo 2020 with very mild AR  4.  Tricuspid regurgitation -noted on echo 2018 with mildly dilated RV and mildly reduced RVF. -repeat echo 03/2019 showed no TR and normal RV size with severe RAE  Medication Adjustments/Labs and Tests Ordered: Current medicines are reviewed at length with the patient today.  Concerns regarding medicines are outlined above.  Orders Placed This Encounter  Procedures  . EKG 12-Lead   No orders of the defined types were placed in this encounter.   Signed, , MD  03/15/2020 1:13 PM    [facility]

## 2020-03-24 ENCOUNTER — Encounter: Payer: Self-pay | Admitting: *Deleted

## 2020-04-05 ENCOUNTER — Other Ambulatory Visit: Payer: Self-pay | Admitting: Internal Medicine

## 2020-04-05 ENCOUNTER — Ambulatory Visit
Admission: RE | Admit: 2020-04-05 | Discharge: 2020-04-05 | Disposition: A | Discharge status: Home / Self Care | Payer: Medicare Other | Source: Ambulatory Visit | Attending: Internal Medicine | Admitting: Internal Medicine

## 2020-04-05 DIAGNOSIS — M25511 Pain in right shoulder: Secondary | ICD-10-CM

## 2020-04-16 ENCOUNTER — Other Ambulatory Visit: Payer: Self-pay | Admitting: Cardiology

## 2020-05-15 NOTE — Progress Notes (Signed)
[facility]  [phone] [phone]    ID: [name]   DOB: 82-21-1937  MR#: [id number]  IEP#:[id number]  Patient Care Team: [name], MD as PCP - General (Internal Medicine) Rojelio Uhrich, Valentino Hue, MD (Hematology and Oncology) [name], MD as Attending Physician (Cardiology) Mia Creek, MD as Consulting Physician (Ophthalmology) Charna Elizabeth, MD as Consulting Physician (Gastroenterology) Nada Libman, MD as Consulting Physician (Vascular Surgery) OTHER MD: OTHER MD:   CHIEF COMPLAINT: hemachromatosis   CURRENT TREATMENT: phlebotomy as needed   INTERVAL HISTORY: [name] returns today for follow-up of his hemochromatosis.  Interval history is generally unremarkable.  He continues to be careful to avoid red meat, cooking with iron utensils, or eating too many oysters  His most recent phlebotomy was performed on 06/16/2019.   REVIEW OF SYSTEMS: [name] tells me he and his family are doing "pretty good".  They have nice plans for the holidays all around here in this area.  He exercises by walking sometimes up to .  A detailed review of systems today was otherwise stable  COVID 19 VACCINATION STATUS: fully vaccinated [employer]), most recently 07/2019, no booster as of December 2021   HISTORY OF PRESENT ILLNESS: [name]'s hemochromatosis was documented by labs and genetics in 1989. His family (sister, children) have been tested and all are negative for the mutation   PAST MEDICAL HISTORY: Past Medical History:  Diagnosis Date  . AAA (abdominal aortic aneurysm) (HCC) 06/29/2007   stent graft  . Aortic stenosis 09/05/2017   Mild AS/AR by echo 01/2017.  Marland Kitchen GERD (gastroesophageal reflux disease)   . Hemochromatosis   . Hyperlipidemia   . Hypertension   . Irritable bowel syndrome   . Permanent atrial fibrillation (HCC)   . Stroke (HCC)    Transient Ischemic Attack    PAST SURGICAL HISTORY: Past Surgical History:  Procedure Laterality Date   . ABDOMINAL AORTIC ANEURYSM REPAIR    . ENDOVASCULAR STENT INSERTION  07/18/2007   saccular infrarenal aortic aneurysm  . EYE SURGERY Bilateral    Cataract    . IR FLUORO GUIDE CV LINE LEFT  12/08/2018  . IR FLUORO GUIDE CV LINE RIGHT  02/13/2017  . IR FLUORO GUIDE CV LINE RIGHT  02/25/2017  . IR US GUIDE VASC ACCESS RIGHT  02/13/2017  . PICC LINE PLACE PERIPHERAL ([facility] HX)  Oct 21, 2014    FAMILY HISTORY Family History  Problem Relation Age of Onset  . Hypertension Mother   . Heart disease Mother        AAA, he passed in his 48 s of Heart Diseasse  . Heart disease Father        AAA history and he passed at age 42  of   Disease   The patient's father died from a myocardial infarction at age 37. The patient's mother died from lung problems at age 110. He had no brothers, one sister. There is no history of hemochromatosis in the family to his knowledge.   SOCIAL HISTORY: [name] has 2 children from his first marriage, [name], who is a Runner, broadcasting/film/video in [place], with one adopted child, and Kelvon Giannini, who works in Engineer, materials, and has one biologic and one adopted child. [name]'s second wife, [name], who was my patient, had 2 children from her prior marriage, and [name]'s current wife, [name], also has 2 children of her own. [name] and [name] married in 2007. The patient attends a [employer].    ADVANCED DIRECTIVES: not in  place   HEALTH MAINTENANCE: Social History   Tobacco Use  . Smoking status: Former Smoker    Types: Cigarettes    Quit date: 06/18/1999    Years since quitting: 20.9  . Smokeless tobacco: Never Used  Substance Use Topics  . Alcohol use: Yes    Alcohol/week: 1.0 - 2.0 standard drink    Types: 1 - 2 Standard drinks or equivalent per week  . Drug use: No    Allergies  Allergen Reactions  . Chlorhexidine Gluconate Itching    Don't use Chloraprep inside port a cath kits.  . Tape     Adhesive tape,   Only use paper tape    Current Outpatient  Medications  Medication Sig Dispense Refill  . ALPRAZolam (XANAX) 0.5 MG tablet Take 0.5 mg by mouth as needed. For anxiety    . amLODipine (NORVASC) 10 MG tablet TAKE 1 TABLET BY MOUTH EVERY DAY 90 tablet 3  . Ascorbic Acid (VITAMIN C) 100 MG tablet Take 100 mg by mouth.     Marland Kitchen atorvastatin (LIPITOR) 20 MG tablet Take 20 mg by mouth daily.     . Cholecalciferol (VITAMIN D3) 25 MCG (1000 UT) CAPS Take by mouth daily.    Marland . ELIQUIS 5 MG TABS tablet TAKE 1 TABLET BY MOUTH TWICE A DAY 60 tablet 5  . hydrochlorothiazide (HYDRODIURIL) 25 MG tablet Take 25 mg by mouth daily.    . hyoscyamine (LEVBID) 0.375 MG 12 hr tablet Take 0.375 mg by mouth as needed.     Marland Kitchen KLOR-CON M20 20 MEQ tablet TAKE 1 TABLET BY MOUTH DAILY. (PLEASE KEEP APPT IN OCT. WITH DR. Mayford Knife BEFORE ANYMORE REFILLS.) 90 tablet 2  . Loperamide HCl (IMODIUM PO) Take by mouth as needed.    . metoprolol tartrate (LOPRESSOR) 25 MG tablet Take 25 mg by mouth 2 (two) times daily.     Marland Kitchen omeprazole (PRILOSEC OTC) 20 MG tablet Take 20 mg by mouth daily.    . ramipril (ALTACE) 5 MG capsule Take 5 mg by mouth daily.     Marland . thiamine (VITAMIN B-1) 100 MG tablet Take 250 mg by mouth daily.    . vitamin E 100 UNIT capsule Take 100 Units by mouth daily.     No current facility-administered medications for this visit.   Facility-Administered Medications Ordered in Other Visits  Medication Dose Route Frequency Provider Last Rate Last Admin  . alteplase (CATHFLO ACTIVASE) injection 2 mg  2 mg Intracatheter Once Sloan Takagi, Valentino Hue, MD      . sodium chloride 0.9 % injection 10 mL  10 mL Intravenous PRN Anaija Wissink, Valentino Hue, MD   10 mL at 11/25/14 1220    OBJECTIVE: White man who appears stated age Vitals:   05/16/20 1350  BP: 124/83  Pulse: (!) 112  Resp: 18  Temp: 97.7 F (36.5 C)  SpO2: 90%     Body mass index is 25.57 kg/m.    ECOG FS: 1  Sclerae unicteric, EOMs intact Wearing a mask No cervical or supraclavicular adenopathy Lungs no  rales or rhonchi Heart regu   CHIEF COMPLAINT: hemachromatosis   CURRENT TREATMENT: phlebotomy as needed   INTERVAL HISTORY: [name] returns today for follow-up of his hemochromatosis.  Interval history is generally unremarkable.  He continues to be careful to avoid red meat, cooking with iron utensils, or eating too many oysters  His most recent phlebotomy was performed on 06/16/2019.   REVIEW OF SYSTEMS: [name] tells me he and his family are doing "pretty good".  They have nice plans for the holidays all around here in this area.  He exercises by walking sometimes up to .  A detailed review of systems today was otherwise stable  COVID 19 VACCINATION STATUS: fully vaccinated [employer]), most recently 07/2019, no booster as of December 2021   HISTORY OF PRESENT ILLNESS: [name]'s hemochromatosis was documented by labs and genetics in 1989. His family (sister, children) have been tested and all are negative for the mutation   PAST MEDICAL HISTORY: Past Medical History:  Diagnosis Date  . AAA (abdominal aortic aneurysm) (HCC) 06/29/2007   stent graft  . Aortic stenosis 09/05/2017   Mild AS/AR by echo 01/2017.  Marland Kitchen GERD (gastroesophageal reflux disease)   . Hemochromatosis   . Hyperlipidemia   . Hypertension   . Irritable bowel syndrome   . Permanent atrial fibrillation (HCC)   . Stroke (HCC)    Transient Ischemic Attack    PAST SURGICAL HISTORY: Past Surgical History:  Procedure Laterality Date   . ABDOMINAL AORTIC ANEURYSM REPAIR    . ENDOVASCULAR STENT INSERTION  07/18/2007   saccular infrarenal aortic aneurysm  . EYE SURGERY Bilateral    Cataract    . IR FLUORO GUIDE CV LINE LEFT  12/08/2018  . IR FLUORO GUIDE CV LINE RIGHT  02/13/2017  . IR FLUORO GUIDE CV LINE RIGHT  02/25/2017  . IR US GUIDE VASC ACCESS RIGHT  02/13/2017  . PICC LINE PLACE PERIPHERAL ([facility] HX)  Oct 21, 2014    FAMILY HISTORY Family History  Problem Relation Age of Onset  . Hypertension Mother   . Heart disease Mother        AAA, he passed in his 48 s of Heart Diseasse  . Heart disease Father        AAA history and he passed at age 42  of   Disease   The patient's father died from a myocardial infarction at age 37. The patient's mother died from lung problems at age 110. He had no brothers, one sister. There is no history of hemochromatosis in the family to his knowledge.   SOCIAL HISTORY: [name] has 2 children from his first marriage, [name], who is a Runner, broadcasting/film/video in [place], with one adopted child, and Kelvon Giannini, who works in Engineer, materials, and has one biologic and one adopted child. [name]'s second wife, [name], who was my patient, had 2 children from her prior marriage, and [name]'s current wife, [name], also has 2 children of her own. [name] and [name] married in 2007. The patient attends a [employer].    ADVANCED DIRECTIVES: not in  place   HEALTH MAINTENANCE: Social History   Tobacco Use  . Smoking status: Former Smoker    Types: Cigarettes    Quit date: 06/18/1999    Years since quitting: 20.9  . Smokeless tobacco: Never Used  Substance Use Topics  . Alcohol use: Yes    Alcohol/week: 1.0 - 2.0 standard drink    Types: 1 - 2 Standard drinks or equivalent per week  . Drug use: No    Allergies  Allergen Reactions  . Chlorhexidine Gluconate Itching    Don't use Chloraprep inside port a cath kits.  . Tape     Adhesive tape,   Only use paper tape    Current Outpatient  Medications  Medication Sig Dispense Refill  . ALPRAZolam (XANAX) 0.5 MG tablet Take 0.5 mg by mouth as needed. For anxiety    . amLODipine (NORVASC) 10 MG tablet TAKE 1 TABLET BY MOUTH EVERY DAY 90 tablet 3  . Ascorbic Acid (VITAMIN C) 100 MG tablet Take 100 mg by mouth.     Marland Kitchen atorvastatin (LIPITOR) 20 MG tablet Take 20 mg by mouth daily.     . Cholecalciferol (VITAMIN D3) 25 MCG (1000 UT) CAPS Take by mouth daily.    Marland Kitchen ELIQUIS 5 MG TABS tablet TAKE 1 TABLET BY MOUTH TWICE A DAY 60 tablet 5  . hydrochlorothiazide (HYDRODIURIL) 25 MG tablet Take 25 mg by mouth daily.    . hyoscyamine (LEVBID) 0.375 MG 12 hr tablet Take 0.375 mg by mouth as needed.     Marland Kitchen KLOR-CON M20 20 MEQ tablet TAKE 1 TABLET BY MOUTH DAILY. (PLEASE KEEP APPT IN OCT. WITH DR. Mayford Knife BEFORE ANYMORE REFILLS.) 90 tablet 2  . Loperamide HCl (IMODIUM PO) Take by mouth as needed.    . metoprolol tartrate (LOPRESSOR) 25 MG tablet Take 25 mg by mouth 2 (two) times daily.     Marland Kitchen omeprazole (PRILOSEC OTC) 20 MG tablet Take 20 mg by mouth daily.    . ramipril (ALTACE) 5 MG capsule Take 5 mg by mouth daily.     Marland Kitchen thiamine (VITAMIN B-1) 100 MG tablet Take 250 mg by mouth daily.    . vitamin E 100 UNIT capsule Take 100 Units by mouth daily.     No current facility-administered medications for this visit.   Facility-Administered Medications Ordered in Other Visits  Medication Dose Route Frequency Provider Last Rate Last Admin  . alteplase (CATHFLO ACTIVASE) injection 2 mg  2 mg Intracatheter Once PRN Sloan Takagi, Valentino Hue, MD      . sodium chloride 0.9 % injection 10 mL  10 mL Intravenous PRN Anaija Wissink, Valentino Hue, MD   10 mL at 11/25/14 1220    OBJECTIVE: White man who appears stated age Vitals:   05/16/20 1350  BP: 124/83  Pulse: (!) 112  Resp: 18  Temp: 97.7 F (36.5 C)  SpO2: 90%     Body mass index is 25.57 kg/m.    ECOG FS: 1  Sclerae unicteric, EOMs intact Wearing a mask No cervical or supraclavicular adenopathy Lungs no  rales or rhonchi Heart regular rate and rhythm Abd soft, nontender, positive bowel sounds MSK no focal spinal tenderness, no upper extremity lymphedema Neuro: nonfocal, well oriented, appropriate affect  LAB RESULTS:  Lab Results  Component Value Date   FERRITIN 113 02/11/2020   FERRITIN 66 11/11/2019   FERRITIN 78 09/10/2019   FERRITIN 55 06/16/2019   FERRITIN 79 06/03/2019    Lab Results  Component Value Date  WBC 6.8 05/16/2020   NEUTROABS 4.6 05/16/2020   HGB 14.0 05/16/2020   HCT 39.9 05/16/2020   MCV 96.1 05/16/2020   PLT 221 05/16/2020      Chemistry      Component Value Date/Time   NA 134 (L) 05/16/2020 1254   NA 136 09/05/2017 1545   NA 135 (L) 01/30/2017 1145   K 4.2 05/16/2020 1254   K 3.5 01/30/2017 1145   CL 96 (L) 05/16/2020 1254   CO2 28 05/16/2020 1254   CO2 27 01/30/2017 1145   BUN 21 05/16/2020 1254   BUN 13 09/05/2017 1545   BUN 11.5 01/30/2017 1145   CREATININE 1.29 (H) 05/16/2020 1254   CREATININE 1.1 01/30/2017 1145      Component Value Date/Time   CALCIUM 10.0 05/16/2020 1254   CALCIUM 10.4 01/30/2017 1145   ALKPHOS 54 05/16/2020 1254   ALKPHOS 57 01/30/2017 1145   AST 24 05/16/2020 1254   AST 22 01/30/2017 1145   ALT 22 05/16/2020 1254   ALT 22 01/30/2017 1145   BILITOT 1.5 (H) 05/16/2020 1254   BILITOT 1.59 (H) 01/30/2017 1145       No results found for: LABCA2  No components found for: LABCA125  No results for input(s): INR in the last 168 hours.  Urinalysis    Component Value Date/Time   COLORURINE YELLOW 07/15/2007 1041   APPEARANCEUR CLEAR 07/15/2007 1041   LABSPEC 1.007 07/15/2007 1041   PHURINE 7.5 07/15/2007 1041   GLUCOSEU NEGATIVE 07/15/2007 1041   HGBUR TRACE (A) 07/15/2007 1041   BILIRUBINUR NEGATIVE 07/15/2007 1041   KETONESUR NEGATIVE 07/15/2007 1041   PROTEINUR NEGATIVE 07/15/2007 1041   UROBILINOGEN 0.2 07/15/2007 1041   NITRITE NEGATIVE 07/15/2007 1041   LEUKOCYTESUR NEGATIVE 07/15/2007 1041     STUDIES: No results found.   ASSESSMENT: 84 y.o. [place] man with a history of hemochromatosis diagnosed in 1989, treated with intermittent phlebotomies.   (1) also history of abdominal aortic aneurysm, status post stent placement  (2) history of irritable bowel syndrome, intermittently symptomatic  (3) atrial fibrillation, on apixaban  (4) poor access, but refuses PICC during   PLAN:  [name] 's ferritin currently is pending.  If it rises above 150 we proceed to phlebotomy until it is close to 50.  It has been a full year since he needed phlebotomy so I am hoping he can wait a little bit longer.  We discussed some fall precautions and other family issues.  Otherwise he will see me again in 1 year and we will continue to check his lab work every 3 months.   Total encounter time 20 minutes.*  Jontavius Rabalais, Valentino Hue, MD  05/16/20 2:13 PM Medical Oncology and Hematology [facility] [address] [place], [place] [address] Tel. [phone]    Fax. [phone]    I, Mickie Bail, am acting as scribe for Dr. Valentino Hue. Ashanta Amoroso.  I, Ruthann Cancer MD, have reviewed the above documentation for accuracy and completeness, and I agree with the above.   *Total Encounter Time as defined by the Centers for Medicare and Medicaid Services includes, in addition to the face-to-face time of a patient visit (documented in the note above) non-face-to-face time: obtaining and reviewing outside history, ordering and reviewing medications, tests or procedures, care coordination (communications with other health care professionals or caregivers) and documentation in the medical record.

## 2020-05-16 ENCOUNTER — Inpatient Hospital Stay (HOSPITAL_BASED_OUTPATIENT_CLINIC_OR_DEPARTMENT_OTHER): Payer: Medicare Other | Admitting: Oncology

## 2020-05-16 ENCOUNTER — Other Ambulatory Visit: Payer: Self-pay

## 2020-05-16 ENCOUNTER — Encounter: Payer: Self-pay | Admitting: Oncology

## 2020-05-16 ENCOUNTER — Inpatient Hospital Stay: Payer: Medicare Other | Attending: Oncology

## 2020-05-16 DIAGNOSIS — E785 Hyperlipidemia, unspecified: Secondary | ICD-10-CM | POA: Insufficient documentation

## 2020-05-16 DIAGNOSIS — I4821 Permanent atrial fibrillation: Secondary | ICD-10-CM | POA: Diagnosis not present

## 2020-05-16 DIAGNOSIS — Z8673 Personal history of transient ischemic attack (TIA), and cerebral infarction without residual deficits: Secondary | ICD-10-CM | POA: Insufficient documentation

## 2020-05-16 DIAGNOSIS — K219 Gastro-esophageal reflux disease without esophagitis: Secondary | ICD-10-CM | POA: Insufficient documentation

## 2020-05-16 DIAGNOSIS — I350 Nonrheumatic aortic (valve) stenosis: Secondary | ICD-10-CM | POA: Diagnosis not present

## 2020-05-16 DIAGNOSIS — I10 Essential (primary) hypertension: Secondary | ICD-10-CM | POA: Diagnosis not present

## 2020-05-16 DIAGNOSIS — K589 Irritable bowel syndrome without diarrhea: Secondary | ICD-10-CM | POA: Diagnosis not present

## 2020-05-16 DIAGNOSIS — Z79899 Other long term (current) drug therapy: Secondary | ICD-10-CM | POA: Insufficient documentation

## 2020-05-16 DIAGNOSIS — Z87891 Personal history of nicotine dependence: Secondary | ICD-10-CM | POA: Diagnosis not present

## 2020-05-16 DIAGNOSIS — Z7901 Long term (current) use of anticoagulants: Secondary | ICD-10-CM | POA: Diagnosis not present

## 2020-05-16 LAB — COMPREHENSIVE METABOLIC PANEL
ALT: 22 U/L (ref 0–44)
AST: 24 U/L (ref 15–41)
Albumin: 4.1 g/dL (ref 3.5–5.0)
Alkaline Phosphatase: 54 U/L (ref 38–126)
Anion gap: 10 (ref 5–15)
BUN: 21 mg/dL (ref 8–23)
CO2: 28 mmol/L (ref 22–32)
Calcium: 10 mg/dL (ref 8.9–10.3)
Chloride: 96 mmol/L — ABNORMAL LOW (ref 98–111)
Creatinine, Ser: 1.29 mg/dL — ABNORMAL HIGH (ref 0.61–1.24)
GFR, Estimated: 55 mL/min — ABNORMAL LOW (ref >60)
Glucose, Bld: 91 mg/dL (ref 70–99)
Potassium: 4.2 mmol/L (ref 3.5–5.1)
Sodium: 134 mmol/L — ABNORMAL LOW (ref 135–145)
Total Bilirubin: 1.5 mg/dL — ABNORMAL HIGH (ref 0.3–1.2)
Total Protein: 7.2 g/dL (ref 6.5–8.1)

## 2020-05-16 LAB — CBC WITH DIFFERENTIAL/PLATELET
Abs Immature Granulocytes: 0.04 10*3/uL (ref 0.00–0.07)
Basophils Absolute: 0 10*3/uL (ref 0.0–0.1)
Basophils Relative: 1 %
Eosinophils Absolute: 0.2 10*3/uL (ref 0.0–0.5)
Eosinophils Relative: 2 %
HCT: 39.9 % (ref 39.0–52.0)
Hemoglobin: 14 g/dL (ref 13.0–17.0)
Immature Granulocytes: 1 %
Lymphocytes Relative: 20 %
Lymphs Abs: 1.4 10*3/uL (ref 0.7–4.0)
MCH: 33.7 pg (ref 26.0–34.0)
MCHC: 35.1 g/dL (ref 30.0–36.0)
MCV: 96.1 fL (ref 80.0–100.0)
Monocytes Absolute: 0.6 10*3/uL (ref 0.1–1.0)
Monocytes Relative: 9 %
Neutro Abs: 4.6 10*3/uL (ref 1.7–7.7)
Neutrophils Relative %: 67 %
Platelets: 221 10*3/uL (ref 150–400)
RBC: 4.15 MIL/uL — ABNORMAL LOW (ref 4.22–5.81)
RDW: 13 % (ref 11.5–15.5)
WBC: 6.8 10*3/uL (ref 4.0–10.5)
nRBC: 0 % (ref 0.0–0.2)

## 2020-05-16 LAB — IRON AND TIBC
Iron: 195 ug/dL — ABNORMAL HIGH (ref 42–163)
Saturation Ratios: 80 % — ABNORMAL HIGH (ref 20–55)
TIBC: 245 ug/dL (ref 202–409)
UIBC: 50 ug/dL — ABNORMAL LOW (ref 117–376)

## 2020-05-16 LAB — FERRITIN: Ferritin: 136 ng/mL (ref 24–336)

## 2020-05-17 ENCOUNTER — Telehealth: Payer: Self-pay | Admitting: Oncology

## 2020-05-17 NOTE — Telephone Encounter (Signed)
Scheduled appts per 12/20 los. Left voicemail with next appt date and time.

## 2020-06-05 ENCOUNTER — Other Ambulatory Visit: Payer: Self-pay | Admitting: Cardiology

## 2020-06-25 ENCOUNTER — Other Ambulatory Visit: Payer: Self-pay | Admitting: Cardiology

## 2020-06-27 NOTE — Telephone Encounter (Signed)
Pt last saw Dr Mayford Knife on 03/15/20, last labs 05/16/20 Creat 1.29, age 85, weight 87.9kg, based on specified criteria pt is on appropriate dosage of Eliquis 5mg  BID.  Will refill rx.

## 2020-08-15 ENCOUNTER — Inpatient Hospital Stay: Payer: Medicare Other | Attending: Oncology

## 2020-08-15 ENCOUNTER — Other Ambulatory Visit: Payer: Self-pay

## 2020-08-15 LAB — IRON AND TIBC
Iron: 219 ug/dL — ABNORMAL HIGH (ref 42–163)
Saturation Ratios: 88 % — ABNORMAL HIGH (ref 20–55)
TIBC: 249 ug/dL (ref 202–409)
UIBC: 30 ug/dL — ABNORMAL LOW (ref 117–376)

## 2020-08-15 LAB — CBC WITH DIFFERENTIAL/PLATELET
Abs Immature Granulocytes: 0.03 10*3/uL (ref 0.00–0.07)
Basophils Absolute: 0 10*3/uL (ref 0.0–0.1)
Basophils Relative: 0 %
Eosinophils Absolute: 0.1 10*3/uL (ref 0.0–0.5)
Eosinophils Relative: 2 %
HCT: 38.9 % — ABNORMAL LOW (ref 39.0–52.0)
Hemoglobin: 14 g/dL (ref 13.0–17.0)
Immature Granulocytes: 1 %
Lymphocytes Relative: 20 %
Lymphs Abs: 1.3 10*3/uL (ref 0.7–4.0)
MCH: 34.1 pg — ABNORMAL HIGH (ref 26.0–34.0)
MCHC: 36 g/dL (ref 30.0–36.0)
MCV: 94.9 fL (ref 80.0–100.0)
Monocytes Absolute: 0.6 10*3/uL (ref 0.1–1.0)
Monocytes Relative: 10 %
Neutro Abs: 4.4 10*3/uL (ref 1.7–7.7)
Neutrophils Relative %: 67 %
Platelets: 175 10*3/uL (ref 150–400)
RBC: 4.1 MIL/uL — ABNORMAL LOW (ref 4.22–5.81)
RDW: 12.9 % (ref 11.5–15.5)
WBC: 6.5 10*3/uL (ref 4.0–10.5)
nRBC: 0 % (ref 0.0–0.2)

## 2020-08-15 LAB — COMPREHENSIVE METABOLIC PANEL
ALT: 20 U/L (ref 0–44)
AST: 20 U/L (ref 15–41)
Albumin: 4.3 g/dL (ref 3.5–5.0)
Alkaline Phosphatase: 58 U/L (ref 38–126)
Anion gap: 9 (ref 5–15)
BUN: 18 mg/dL (ref 8–23)
CO2: 27 mmol/L (ref 22–32)
Calcium: 9.9 mg/dL (ref 8.9–10.3)
Chloride: 95 mmol/L — ABNORMAL LOW (ref 98–111)
Creatinine, Ser: 1.23 mg/dL (ref 0.61–1.24)
GFR, Estimated: 58 mL/min — ABNORMAL LOW (ref >60)
Glucose, Bld: 99 mg/dL (ref 70–99)
Potassium: 3.7 mmol/L (ref 3.5–5.1)
Sodium: 131 mmol/L — ABNORMAL LOW (ref 135–145)
Total Bilirubin: 1.4 mg/dL — ABNORMAL HIGH (ref 0.3–1.2)
Total Protein: 7.2 g/dL (ref 6.5–8.1)

## 2020-08-15 LAB — FERRITIN: Ferritin: 158 ng/mL (ref 24–336)

## 2020-08-16 ENCOUNTER — Other Ambulatory Visit: Payer: Self-pay | Admitting: Oncology

## 2020-08-16 ENCOUNTER — Telehealth: Payer: Self-pay

## 2020-08-16 NOTE — Telephone Encounter (Signed)
RN left voicemail for return call.  Re: Phlebotomy.

## 2020-08-16 NOTE — Telephone Encounter (Signed)
MD reviewed lab results - Ferritin 158 - Per MD orders phlebotomies Q 2 weeks until Ferritin is close to 50.    RN notified patient - pt verbalized understanding and agreement.  Scheduling message sent.

## 2020-08-18 ENCOUNTER — Telehealth: Payer: Self-pay | Admitting: Oncology

## 2020-08-18 NOTE — Telephone Encounter (Signed)
Scheduled appts per 3/23 sch msg. Pt aware.  

## 2020-08-31 ENCOUNTER — Inpatient Hospital Stay: Payer: Medicare Other | Attending: Oncology

## 2020-08-31 ENCOUNTER — Other Ambulatory Visit: Payer: Self-pay

## 2020-08-31 ENCOUNTER — Other Ambulatory Visit: Payer: Self-pay | Admitting: Oncology

## 2020-08-31 VITALS — BP 125/70 | HR 78 | Temp 97.8°F | Resp 18

## 2020-08-31 DIAGNOSIS — Z452 Encounter for adjustment and management of vascular access device: Secondary | ICD-10-CM

## 2020-08-31 NOTE — Progress Notes (Signed)
Eugene [name] presents today for phlebotomy per MD orders. Phlebotomy procedure started at 1434 and ended at  1458 with 506 grams removed via an 18G to L forearm Patient observed for 10 minutes after procedure without any incident-pt declined to stay for 30 full minutes.  Patient tolerated procedure well. Diet and nutrition offered.

## 2020-08-31 NOTE — Patient Instructions (Signed)
Therapeutic Phlebotomy Therapeutic phlebotomy is the planned removal of blood from a person's body for the purpose of treating a medical condition. The procedure is similar to donating blood. Usually, about a pint (470 mL, or 0.47 L) of blood is removed. The average adult has 9-12 pints (4.3-5.7 L) of blood in the body. Therapeutic phlebotomy may be used to treat the following medical conditions:  Hemochromatosis. This is a condition in which the blood contains too much iron.  Polycythemia vera. This is a condition in which the blood contains too many red blood cells.  Porphyria cutanea tarda. This is a disease in which an important part of hemoglobin is not made properly. It results in the buildup of abnormal amounts of porphyrins in the body.  Sickle cell disease. This is a condition in which the red blood cells form an abnormal crescent shape rather than a round shape. Tell a health care provider about:  Any allergies you have.  All medicines you are taking, including vitamins, herbs, eye drops, creams, and over-the-counter medicines.  Any problems you or family members have had with anesthetic medicines.  Any blood disorders you have.  Any surgeries you have had.  Any medical conditions you have.  Whether you are pregnant or may be pregnant. What are the risks? Generally, this is a safe procedure. However, problems may occur, including:  Nausea or light-headedness.  Low blood pressure (hypotension).  Soreness, bleeding, swelling, or bruising at the needle insertion site.  Infection. What happens before the procedure?  Follow instructions from your health care provider about eating or drinking restrictions.  Ask your health care provider about: ? Changing or stopping your regular medicines. This is especially important if you are taking diabetes medicines or blood thinners (anticoagulants). ? Taking medicines such as aspirin and ibuprofen. These medicines can thin your  blood. Do not take these medicines unless your health care provider tells you to take them. ? Taking over-the-counter medicines, vitamins, herbs, and supplements.  Wear clothing with sleeves that can be raised above the elbow.  Plan to have someone take you home from the hospital or clinic.  You may have a blood sample taken.  Your blood pressure, pulse rate, and breathing rate will be measured. What happens during the procedure?   To lower your risk of infection: ? Your health care team will wash or sanitize their hands. ? Your skin will be cleaned with an antiseptic.  You may be given a medicine to numb the area (local anesthetic).  A tourniquet will be placed on your arm.  A needle will be inserted into one of your veins.  Tubing and a collection bag will be attached to that needle.  Blood will flow through the needle and tubing into the collection bag.  The collection bag will be placed lower than your arm to allow gravity to help the flow of blood into the bag.  You may be asked to open and close your hand slowly and continually during the entire collection.  After the specified amount of blood has been removed from your body, the collection bag and tubing will be clamped.  The needle will be removed from your vein.  Pressure will be held on the site of the needle insertion to stop the bleeding.  A bandage (dressing) will be placed over the needle insertion site. The procedure may vary among health care providers and hospitals. What happens after the procedure?  Your blood pressure, pulse rate, and breathing rate will be   measured after the procedure.  You will be encouraged to drink fluids.  Your recovery will be assessed and monitored.  You can return to your normal activities as told by your health care provider. Summary  Therapeutic phlebotomy is the planned removal of blood from a person's body for the purpose of treating a medical condition.  Therapeutic  phlebotomy may be used to treat hemochromatosis, polycythemia vera, porphyria cutanea tarda, or sickle cell disease.  In the procedure, a needle is inserted and about a pint (470 mL, or 0.47 L) of blood is removed. The average adult has 9-12 pints (4.3-5.7 L) of blood in the body.  This is generally a safe procedure, but it can sometimes cause problems such as nausea, light-headedness, or low blood pressure (hypotension). This information is not intended to replace advice given to you by your health care provider. Make sure you discuss any questions you have with your health care provider. Document Revised: 05/30/2017 Document Reviewed: 05/30/2017 Elsevier Patient Education  2020 Elsevier Inc.  

## 2020-09-14 ENCOUNTER — Inpatient Hospital Stay: Payer: Medicare Other

## 2020-09-14 ENCOUNTER — Other Ambulatory Visit: Payer: Self-pay

## 2020-09-14 LAB — CBC WITH DIFFERENTIAL/PLATELET
Abs Immature Granulocytes: 0.03 10*3/uL (ref 0.00–0.07)
Basophils Absolute: 0 10*3/uL (ref 0.0–0.1)
Basophils Relative: 0 %
Eosinophils Absolute: 0.2 10*3/uL (ref 0.0–0.5)
Eosinophils Relative: 3 %
HCT: 36.9 % — ABNORMAL LOW (ref 39.0–52.0)
Hemoglobin: 13.3 g/dL (ref 13.0–17.0)
Immature Granulocytes: 0 %
Lymphocytes Relative: 18 %
Lymphs Abs: 1.3 10*3/uL (ref 0.7–4.0)
MCH: 34.6 pg — ABNORMAL HIGH (ref 26.0–34.0)
MCHC: 36 g/dL (ref 30.0–36.0)
MCV: 96.1 fL (ref 80.0–100.0)
Monocytes Absolute: 0.7 10*3/uL (ref 0.1–1.0)
Monocytes Relative: 10 %
Neutro Abs: 4.8 10*3/uL (ref 1.7–7.7)
Neutrophils Relative %: 69 %
Platelets: 200 10*3/uL (ref 150–400)
RBC: 3.84 MIL/uL — ABNORMAL LOW (ref 4.22–5.81)
RDW: 13 % (ref 11.5–15.5)
WBC: 7 10*3/uL (ref 4.0–10.5)
nRBC: 0 % (ref 0.0–0.2)

## 2020-09-14 NOTE — Progress Notes (Signed)
Patient has labs drawn during phlebotomy procedure with 18 gauge catheter and then attaching empty bag for phlebotomy. Tolerated well. Started phlebotomy at 1508 and ending at 1540 obtaining 520gms. Patient stayed for 20 post phlebotomy declined 30 minutes. Eating and drinking after treatment without difficulty

## 2020-09-15 LAB — FERRITIN: Ferritin: 128 ng/mL (ref 24–336)

## 2020-09-15 LAB — IRON AND TIBC
Iron: 176 ug/dL — ABNORMAL HIGH (ref 42–163)
Saturation Ratios: 66 % — ABNORMAL HIGH (ref 20–55)
TIBC: 268 ug/dL (ref 202–409)
UIBC: 92 ug/dL — ABNORMAL LOW (ref 117–376)

## 2020-09-21 ENCOUNTER — Other Ambulatory Visit: Payer: Medicare Other

## 2020-09-28 ENCOUNTER — Other Ambulatory Visit: Payer: Self-pay

## 2020-09-28 ENCOUNTER — Inpatient Hospital Stay: Payer: Medicare Other | Attending: Oncology

## 2020-09-28 ENCOUNTER — Other Ambulatory Visit: Payer: Medicare Other

## 2020-09-28 ENCOUNTER — Inpatient Hospital Stay: Payer: Medicare Other

## 2020-09-28 DIAGNOSIS — Z452 Encounter for adjustment and management of vascular access device: Secondary | ICD-10-CM

## 2020-09-28 LAB — IRON AND TIBC
Iron: 113 ug/dL (ref 45–182)
Saturation Ratios: 38 % (ref 17.9–39.5)
TIBC: 300 ug/dL (ref 250–450)
UIBC: 187 ug/dL

## 2020-09-28 LAB — FERRITIN: Ferritin: 79 ng/mL (ref 24–336)

## 2020-09-28 NOTE — Patient Instructions (Signed)

## 2020-09-28 NOTE — Progress Notes (Signed)
Pt presented for a phlebotomy procedure, 18g in LFA used, labs drawn prior per MD notes. Procedure started at 1535 and ended at 1603, VSS, 1 unit (500g) removed. Pt tolerated well and declined to stay for 30 min observation, discharged in stable condition ambulatory to lobby.

## 2020-10-12 ENCOUNTER — Other Ambulatory Visit: Payer: Medicare Other

## 2020-10-12 ENCOUNTER — Other Ambulatory Visit: Payer: Self-pay

## 2020-10-12 ENCOUNTER — Inpatient Hospital Stay: Payer: Medicare Other

## 2020-10-12 DIAGNOSIS — Z452 Encounter for adjustment and management of vascular access device: Secondary | ICD-10-CM

## 2020-10-12 LAB — CBC WITH DIFFERENTIAL/PLATELET
Abs Immature Granulocytes: 0.03 10*3/uL (ref 0.00–0.07)
Basophils Absolute: 0 10*3/uL (ref 0.0–0.1)
Basophils Relative: 1 %
Eosinophils Absolute: 0.2 10*3/uL (ref 0.0–0.5)
Eosinophils Relative: 3 %
HCT: 36.4 % — ABNORMAL LOW (ref 39.0–52.0)
Hemoglobin: 13 g/dL (ref 13.0–17.0)
Immature Granulocytes: 1 %
Lymphocytes Relative: 18 %
Lymphs Abs: 1 10*3/uL (ref 0.7–4.0)
MCH: 34.5 pg — ABNORMAL HIGH (ref 26.0–34.0)
MCHC: 35.7 g/dL (ref 30.0–36.0)
MCV: 96.6 fL (ref 80.0–100.0)
Monocytes Absolute: 0.6 10*3/uL (ref 0.1–1.0)
Monocytes Relative: 10 %
Neutro Abs: 3.9 10*3/uL (ref 1.7–7.7)
Neutrophils Relative %: 67 %
Platelets: 209 10*3/uL (ref 150–400)
RBC: 3.77 MIL/uL — ABNORMAL LOW (ref 4.22–5.81)
RDW: 13 % (ref 11.5–15.5)
WBC: 5.8 10*3/uL (ref 4.0–10.5)
nRBC: 0 % (ref 0.0–0.2)

## 2020-10-12 LAB — IRON AND TIBC
Iron: 146 ug/dL (ref 42–163)
Saturation Ratios: 52 % (ref 20–55)
TIBC: 283 ug/dL (ref 202–409)
UIBC: 137 ug/dL (ref 117–376)

## 2020-10-12 LAB — COMPREHENSIVE METABOLIC PANEL
ALT: 13 U/L (ref 0–44)
AST: 23 U/L (ref 15–41)
Albumin: 4.1 g/dL (ref 3.5–5.0)
Alkaline Phosphatase: 54 U/L (ref 38–126)
Anion gap: 11 (ref 5–15)
BUN: 18 mg/dL (ref 8–23)
CO2: 26 mmol/L (ref 22–32)
Calcium: 9.6 mg/dL (ref 8.9–10.3)
Chloride: 93 mmol/L — ABNORMAL LOW (ref 98–111)
Creatinine, Ser: 1.23 mg/dL (ref 0.61–1.24)
GFR, Estimated: 58 mL/min — ABNORMAL LOW (ref >60)
Glucose, Bld: 117 mg/dL — ABNORMAL HIGH (ref 70–99)
Potassium: 3.5 mmol/L (ref 3.5–5.1)
Sodium: 130 mmol/L — ABNORMAL LOW (ref 135–145)
Total Bilirubin: 1.2 mg/dL (ref 0.3–1.2)
Total Protein: 7.1 g/dL (ref 6.5–8.1)

## 2020-10-12 LAB — FERRITIN: Ferritin: 77 ng/mL (ref 24–336)

## 2020-10-12 NOTE — Patient Instructions (Signed)

## 2020-10-12 NOTE — Progress Notes (Signed)
[name] presents today for phlebotomy per MD orders. Phlebotomy procedure started at 1445 via 18G to L FA. Labs drawn per MD orders, then approximately 70cc of blood removed before line clotted off. Second 18G to L FA placed at 1454. Phlebotomy procedure continued and ended at 1511. Additional 430cc removed for total of 500cc. IV needle removed intact. Patient tolerated procedure well. Offered food and drink and observed for 10 minutes after without any incident. Declined remainder of observation period.

## 2020-10-26 ENCOUNTER — Inpatient Hospital Stay: Payer: Medicare Other | Attending: Oncology

## 2020-10-26 ENCOUNTER — Inpatient Hospital Stay: Payer: Medicare Other

## 2020-10-26 ENCOUNTER — Other Ambulatory Visit: Payer: Self-pay

## 2020-10-26 ENCOUNTER — Other Ambulatory Visit: Payer: Medicare Other

## 2020-10-26 VITALS — BP 122/65 | HR 80 | Temp 98.2°F | Resp 18

## 2020-10-26 DIAGNOSIS — Z452 Encounter for adjustment and management of vascular access device: Secondary | ICD-10-CM

## 2020-10-26 LAB — COMPREHENSIVE METABOLIC PANEL
ALT: 11 U/L (ref 0–44)
AST: 19 U/L (ref 15–41)
Albumin: 4.1 g/dL (ref 3.5–5.0)
Alkaline Phosphatase: 57 U/L (ref 38–126)
Anion gap: 15 (ref 5–15)
BUN: 17 mg/dL (ref 8–23)
CO2: 23 mmol/L (ref 22–32)
Calcium: 9.8 mg/dL (ref 8.9–10.3)
Chloride: 93 mmol/L — ABNORMAL LOW (ref 98–111)
Creatinine, Ser: 1.19 mg/dL (ref 0.61–1.24)
GFR, Estimated: >60 mL/min (ref >60)
Glucose, Bld: 94 mg/dL (ref 70–99)
Potassium: 4.1 mmol/L (ref 3.5–5.1)
Sodium: 131 mmol/L — ABNORMAL LOW (ref 135–145)
Total Bilirubin: 1.2 mg/dL (ref 0.3–1.2)
Total Protein: 7.1 g/dL (ref 6.5–8.1)

## 2020-10-26 LAB — CBC WITH DIFFERENTIAL/PLATELET
Abs Immature Granulocytes: 0.03 10*3/uL (ref 0.00–0.07)
Basophils Absolute: 0 10*3/uL (ref 0.0–0.1)
Basophils Relative: 1 %
Eosinophils Absolute: 0.2 10*3/uL (ref 0.0–0.5)
Eosinophils Relative: 3 %
HCT: 37.2 % — ABNORMAL LOW (ref 39.0–52.0)
Hemoglobin: 13.1 g/dL (ref 13.0–17.0)
Immature Granulocytes: 1 %
Lymphocytes Relative: 18 %
Lymphs Abs: 1 10*3/uL (ref 0.7–4.0)
MCH: 33.9 pg (ref 26.0–34.0)
MCHC: 35.2 g/dL (ref 30.0–36.0)
MCV: 96.4 fL (ref 80.0–100.0)
Monocytes Absolute: 0.6 10*3/uL (ref 0.1–1.0)
Monocytes Relative: 10 %
Neutro Abs: 4 10*3/uL (ref 1.7–7.7)
Neutrophils Relative %: 67 %
Platelets: 229 10*3/uL (ref 150–400)
RBC: 3.86 MIL/uL — ABNORMAL LOW (ref 4.22–5.81)
RDW: 12.8 % (ref 11.5–15.5)
WBC: 5.8 10*3/uL (ref 4.0–10.5)
nRBC: 0 % (ref 0.0–0.2)

## 2020-10-26 NOTE — Patient Instructions (Signed)
Therapeutic Phlebotomy Therapeutic phlebotomy is the planned removal of blood from a person's body for the purpose of treating a medical condition. The procedure is similar to donating blood. Usually, about a pint (470 mL, or 0.47 L) of blood is removed. The average adult has 9-12 pints (4.3-5.7 L) of blood in the body. Therapeutic phlebotomy may be used to treat the following medical conditions:  Hemochromatosis. This is a condition in which the blood contains too much iron.  Polycythemia vera. This is a condition in which the blood contains too many red blood cells.  Porphyria cutanea tarda. This is a disease in which an important part of hemoglobin is not made properly. It results in the buildup of abnormal amounts of porphyrins in the body.  Sickle cell disease. This is a condition in which the red blood cells form an abnormal crescent shape rather than a round shape. Tell a health care provider about:  Any allergies you have.  All medicines you are taking, including vitamins, herbs, eye drops, creams, and over-the-counter medicines.  Any problems you or family members have had with anesthetic medicines.  Any blood disorders you have.  Any surgeries you have had.  Any medical conditions you have.  Whether you are pregnant or may be pregnant. What are the risks? Generally, this is a safe procedure. However, problems may occur, including:  Nausea or light-headedness.  Low blood pressure (hypotension).  Soreness, bleeding, swelling, or bruising at the needle insertion site.  Infection. What happens before the procedure?  Follow instructions from your health care provider about eating or drinking restrictions.  Ask your health care provider about: ? Changing or stopping your regular medicines. This is especially important if you are taking diabetes medicines or blood thinners (anticoagulants). ? Taking medicines such as aspirin and ibuprofen. These medicines can thin your  blood. Do not take these medicines unless your health care provider tells you to take them. ? Taking over-the-counter medicines, vitamins, herbs, and supplements.  Wear clothing with sleeves that can be raised above the elbow.  Plan to have someone take you home from the hospital or clinic.  You may have a blood sample taken.  Your blood pressure, pulse rate, and breathing rate will be measured. What happens during the procedure?  To lower your risk of infection: ? Your health care team will wash or sanitize their hands. ? Your skin will be cleaned with an antiseptic.  You may be given a medicine to numb the area (local anesthetic).  A tourniquet will be placed on your arm.  A needle will be inserted into one of your veins.  Tubing and a collection bag will be attached to that needle.  Blood will flow through the needle and tubing into the collection bag.  The collection bag will be placed lower than your arm to allow gravity to help the flow of blood into the bag.  You may be asked to open and close your hand slowly and continually during the entire collection.  After the specified amount of blood has been removed from your body, the collection bag and tubing will be clamped.  The needle will be removed from your vein.  Pressure will be held on the site of the needle insertion to stop the bleeding.  A bandage (dressing) will be placed over the needle insertion site. The procedure may vary among health care providers and hospitals.   What happens after the procedure?  Your blood pressure, pulse rate, and breathing rate will   be measured after the procedure.  You will be encouraged to drink fluids.  Your recovery will be assessed and monitored.  You can return to your normal activities as told by your health care provider. Summary  Therapeutic phlebotomy is the planned removal of blood from a person's body for the purpose of treating a medical condition.  Therapeutic  phlebotomy may be used to treat hemochromatosis, polycythemia vera, porphyria cutanea tarda, or sickle cell disease.  In the procedure, a needle is inserted and about a pint (470 mL, or 0.47 L) of blood is removed. The average adult has 9-12 pints (4.3-5.7 L) of blood in the body.  This is generally a safe procedure, but it can sometimes cause problems such as nausea, light-headedness, or low blood pressure (hypotension). This information is not intended to replace advice given to you by your health care provider. Make sure you discuss any questions you have with your health care provider. Document Revised: 05/30/2017 Document Reviewed: 05/30/2017 Elsevier Patient Education  2021 Elsevier Inc.  

## 2020-10-26 NOTE — Progress Notes (Signed)
Eugene [name] presents today for phlebotomy per MD orders. Phlebotomy procedure started at 1503 via 18G to R FA. Labs drawn per MD orders, with a total of 564cc blood removed. Phlebotomy procedure ended at 1520. IV needle removed intact. Patient tolerated procedure well. Offered food and drink and observed for about 15 minutes after without any incident, declined remainder of observation period. VSS and patient in no distress upon leaving infusion room.

## 2020-10-27 LAB — FERRITIN: Ferritin: 47 ng/mL (ref 24–336)

## 2020-10-27 LAB — IRON AND TIBC
Iron: 100 ug/dL (ref 42–163)
Saturation Ratios: 34 % (ref 20–55)
TIBC: 291 ug/dL (ref 202–409)
UIBC: 192 ug/dL (ref 117–376)

## 2020-11-09 ENCOUNTER — Inpatient Hospital Stay: Payer: Medicare Other

## 2020-11-09 ENCOUNTER — Other Ambulatory Visit: Payer: Medicare Other

## 2020-11-09 ENCOUNTER — Other Ambulatory Visit: Payer: Self-pay | Admitting: *Deleted

## 2020-11-09 ENCOUNTER — Other Ambulatory Visit: Payer: Self-pay

## 2020-11-09 DIAGNOSIS — Z95828 Presence of other vascular implants and grafts: Secondary | ICD-10-CM

## 2020-11-09 NOTE — Progress Notes (Signed)
Phlebotomy on hold today due to recent lab values. Pt discharged in stable condition and will await call from desk nurse for further plans.

## 2020-11-15 ENCOUNTER — Inpatient Hospital Stay: Payer: Medicare Other

## 2020-11-23 ENCOUNTER — Inpatient Hospital Stay: Payer: Medicare Other

## 2020-11-23 ENCOUNTER — Other Ambulatory Visit: Payer: Medicare Other

## 2020-11-25 ENCOUNTER — Encounter: Payer: Self-pay | Admitting: Oncology

## 2020-12-07 ENCOUNTER — Other Ambulatory Visit: Payer: Medicare Other

## 2020-12-21 ENCOUNTER — Other Ambulatory Visit: Payer: Medicare Other

## 2020-12-22 ENCOUNTER — Other Ambulatory Visit: Payer: Self-pay | Admitting: Cardiology

## 2020-12-22 NOTE — Telephone Encounter (Signed)
Prescription refill request for Eliquis received. Indication: Afib Last office visit: 03/15/20, [name] Scr: 1.19 Age: 85 Weight: 87.9kg  Appropriate dose and refill sent to requested pharmacy.

## 2021-01-04 ENCOUNTER — Other Ambulatory Visit: Payer: Medicare Other

## 2021-01-18 ENCOUNTER — Other Ambulatory Visit: Payer: Medicare Other

## 2021-02-01 ENCOUNTER — Other Ambulatory Visit: Payer: Medicare Other

## 2021-02-15 ENCOUNTER — Other Ambulatory Visit: Payer: Self-pay

## 2021-02-15 ENCOUNTER — Other Ambulatory Visit: Payer: Medicare Other

## 2021-02-15 ENCOUNTER — Inpatient Hospital Stay: Payer: Medicare Other

## 2021-02-15 ENCOUNTER — Inpatient Hospital Stay: Payer: Medicare Other | Attending: Oncology | Admitting: Oncology

## 2021-02-15 DIAGNOSIS — Z8673 Personal history of transient ischemic attack (TIA), and cerebral infarction without residual deficits: Secondary | ICD-10-CM | POA: Diagnosis not present

## 2021-02-15 DIAGNOSIS — I10 Essential (primary) hypertension: Secondary | ICD-10-CM | POA: Insufficient documentation

## 2021-02-15 DIAGNOSIS — Z79899 Other long term (current) drug therapy: Secondary | ICD-10-CM | POA: Insufficient documentation

## 2021-02-15 DIAGNOSIS — I4821 Permanent atrial fibrillation: Secondary | ICD-10-CM | POA: Insufficient documentation

## 2021-02-15 DIAGNOSIS — Z7901 Long term (current) use of anticoagulants: Secondary | ICD-10-CM | POA: Insufficient documentation

## 2021-02-15 LAB — CBC WITH DIFFERENTIAL/PLATELET
Abs Immature Granulocytes: 0.03 10*3/uL (ref 0.00–0.07)
Basophils Absolute: 0 10*3/uL (ref 0.0–0.1)
Basophils Relative: 1 %
Eosinophils Absolute: 0.1 10*3/uL (ref 0.0–0.5)
Eosinophils Relative: 2 %
HCT: 39.8 % (ref 39.0–52.0)
Hemoglobin: 13.9 g/dL (ref 13.0–17.0)
Immature Granulocytes: 1 %
Lymphocytes Relative: 18 %
Lymphs Abs: 1.1 10*3/uL (ref 0.7–4.0)
MCH: 32.9 pg (ref 26.0–34.0)
MCHC: 34.9 g/dL (ref 30.0–36.0)
MCV: 94.1 fL (ref 80.0–100.0)
Monocytes Absolute: 0.6 10*3/uL (ref 0.1–1.0)
Monocytes Relative: 10 %
Neutro Abs: 4.3 10*3/uL (ref 1.7–7.7)
Neutrophils Relative %: 68 %
Platelets: 214 10*3/uL (ref 150–400)
RBC: 4.23 MIL/uL (ref 4.22–5.81)
RDW: 14 % (ref 11.5–15.5)
WBC: 6.3 10*3/uL (ref 4.0–10.5)
nRBC: 0 % (ref 0.0–0.2)

## 2021-02-15 LAB — COMPREHENSIVE METABOLIC PANEL
ALT: 14 U/L (ref 0–44)
AST: 17 U/L (ref 15–41)
Albumin: 4.2 g/dL (ref 3.5–5.0)
Alkaline Phosphatase: 67 U/L (ref 38–126)
Anion gap: 10 (ref 5–15)
BUN: 21 mg/dL (ref 8–23)
CO2: 24 mmol/L (ref 22–32)
Calcium: 10 mg/dL (ref 8.9–10.3)
Chloride: 98 mmol/L (ref 98–111)
Creatinine, Ser: 1.26 mg/dL — ABNORMAL HIGH (ref 0.61–1.24)
GFR, Estimated: 57 mL/min — ABNORMAL LOW (ref >60)
Glucose, Bld: 106 mg/dL — ABNORMAL HIGH (ref 70–99)
Potassium: 4.6 mmol/L (ref 3.5–5.1)
Sodium: 132 mmol/L — ABNORMAL LOW (ref 135–145)
Total Bilirubin: 1.3 mg/dL — ABNORMAL HIGH (ref 0.3–1.2)
Total Protein: 7.1 g/dL (ref 6.5–8.1)

## 2021-02-15 LAB — IRON AND TIBC
Iron: 130 ug/dL (ref 42–163)
Saturation Ratios: 52 % (ref 20–55)
TIBC: 250 ug/dL (ref 202–409)
UIBC: 120 ug/dL (ref 117–376)

## 2021-02-15 LAB — FERRITIN: Ferritin: 69 ng/mL (ref 24–336)

## 2021-02-15 NOTE — Progress Notes (Signed)
[facility]  [phone] [phone]    ID: Eugene [name]   DOB: 06/17/1935  MR#: [id number]  OQH#:[id number]  Patient Care Team: [name], MD as PCP - General (Internal Medicine) Esai Stecklein, Valentino Hue, MD (Hematology and Oncology) [name], MD as Attending Physician (Cardiology) Mia Creek, MD as Consulting Physician (Ophthalmology) Charna Elizabeth, MD as Consulting Physician (Gastroenterology) Nada Libman, MD as Consulting Physician (Vascular Surgery) OTHER MD:   CHIEF COMPLAINT: hemachromatosis   CURRENT TREATMENT: phlebotomy as needed   INTERVAL HISTORY: Eugene [name] returns today for follow-up of his hemochromatosis.  The interval history is unremarkable except for some family issues that we discussed at length today.  His most recent phlebotomy was performed on 10/26/2020.  His most recent ferritin was under 50   REVIEW OF SYSTEMS: Eugene [name] is now the primary caregiver for his wife Eugene [name].  She does have cognitive dysfunction issues which are followed by Dr.Hussain.  Eugene [name] does not feel he can leave her alone and this is a significant stress on him at this point.  Aside from that a detailed review of systems mostly   COVID 19 VACCINATION STATUS: fully vaccinated [employer]), most recently 07/2019, no booster as of December 2021   HISTORY OF PRESENT ILLNESS: Eugene [name]'s hemochromatosis was documented by labs and genetics in 1989. His family (sister, children) have been tested and all are negative for the mutation   PAST MEDICAL HISTORY: Past Medical History:  Diagnosis Date   AAA (abdominal aortic aneurysm) (HCC) 06/29/2007   stent graft   Aortic stenosis 09/05/2017   Mild AS/AR by echo 01/2017.   GERD (gastroesophageal reflux disease)    Hemochromatosis    Hyperlipidemia    Hypertension    Irritable bowel syndrome    Permanent atrial fibrillation (HCC)    Stroke (HCC)    Transient Ischemic Attack    PAST SURGICAL HISTORY: Past  Surgical History:  Procedure Laterality Date   ABDOMINAL AORTIC ANEURYSM REPAIR     ENDOVASCULAR STENT INSERTION  07/18/2007   saccular infrarenal aortic aneurysm   EYE SURGERY Bilateral    Cataract     IR FLUORO GUIDE CV LINE LEFT  12/08/2018   IR FLUORO GUIDE CV LINE RIGHT  02/13/2017   IR FLUORO GUIDE CV LINE RIGHT  02/25/2017   IR US GUIDE VASC ACCESS RIGHT  02/13/2017   PICC LINE PLACE PERIPHERAL ([facility] HX)  Oct 21, 2014    FAMILY HISTORY Family History  Problem Relation Age of Onset   Hypertension Mother    Heart disease Mother        AAA, he passed in his 90 s of Heart Diseasse   Heart disease Father        AAA history and he passed at age 61  of   Disease  The patient's father died from a myocardial infarction at age 22. The patient's mother died from lung problems at age 49. He had no brothers, one sister. There is no history of hemochromatosis in the family to his knowledge.   SOCIAL HISTORY: Eugene [name] has 2 children from his first marriage, Eugene [name], who is a Runner, broadcasting/film/video in [place], with one adopted child, and Eugene [name], who works in Engineer, materials, and has one biologic and one adopted child. Eugene [name]'s second wife, Eugene [name], who was my patient, had 2 children from her prior marriage, and Eugene [name]'s current wife, Eugene [name], also has 2 children of her own. Eugene [name] and Eugene [name] married in 2007. The patient attends a  [employer].    ADVANCED DIRECTIVES: not in place   HEALTH MAINTENANCE: Social History   Tobacco Use   Smoking status: Former    Types: Cigarettes    Quit date: 06/18/1999    Years since quitting: 21.6   Smokeless tobacco: Never  Substance Use Topics   Alcohol use: Yes    Alcohol/week: 1.0 - 2.0 standard drink    Types: 1 - 2 Standard drinks or equivalent per week   Drug use: No    Allergies  Allergen Reactions   Chlorhexidine Gluconate Itching    Don't use Chloraprep inside port a cath kits.   Tape     Adhesive tape,   Only use paper tape     Current Outpatient Medications  Medication Sig Dispense Refill   ALPRAZolam (XANAX) 0.5 MG tablet Take 0.5 mg by mouth as needed. For anxiety     amLODipine (NORVASC) 10 MG tablet TAKE 1 TABLET BY MOUTH EVERY DAY 90 tablet 3   Ascorbic Acid (VITAMIN C) 100 MG tablet Take 100 mg by mouth.      atorvastatin (LIPITOR) 20 MG tablet Take 20 mg by mouth daily.      Cholecalciferol (VITAMIN D3) 25 MCG (1000 UT) CAPS Take by mouth daily.     ELIQUIS 5 MG TABS tablet TAKE 1 TABLET BY MOUTH TWICE A DAY 60 tablet 5   hydrochlorothiazide (HYDRODIURIL) 25 MG tablet Take 25 mg by mouth daily.     hyoscyamine (LEVBID) 0.375 MG 12 hr tablet Take 0.375 mg by mouth as needed.      KLOR-CON M20 20 MEQ tablet TAKE 1 TABLET BY MOUTH DAILY. (PLEASE KEEP APPT IN OCT. WITH DR. Mayford Knife BEFORE ANYMORE REFILLS.) 90 tablet 2   Loperamide HCl (IMODIUM PO) Take by mouth as needed.     metoprolol tartrate (LOPRESSOR) 25 MG tablet Take 25 mg by mouth 2 (two) times daily.      omeprazole (PRILOSEC OTC) 20 MG tablet Take 20 mg by mouth daily.     ramipril (ALTACE) 5 MG capsule Take 5 mg by mouth daily.      thiamine (VITAMIN B-1) 100 MG tablet Take 250 mg by mouth daily.     vitamin E 100 UNIT capsule Take 100 Units by mouth daily.     No current facility-administered medications for this visit.   Facility-Administered Medications Ordered in Other Visits  Medication Dose Route Frequency Provider Last Rate Last Admin   alteplase (CATHFLO ACTIVASE) injection 2 mg  2 mg Intracatheter Once PRN Timiyah Romito, Valentino Hue, MD       sodium chloride 0.9 % injection 10 mL  10 mL Intravenous PRN Cassadee Vanzandt, Valentino Hue, MD   10 mL at 11/25/14 1220    OBJECTIVE: White man who appears stated age Vitals:   02/15/21 1415  BP: 130/75  Pulse: 81  Resp: 18  Temp: 97.9 F (36.6 C)  SpO2: 100%      Body mass index is 25.65 kg/m.    ECOG FS: 1  Sclerae unicteric, EOMs intact Wearing a mask No cervical or supraclavicular  adenopathy Lungs no rales or rhonchi Heart regular rate and rhythm Abd soft, nontender, positive bowel sounds MSK no focal spinal tenderness, no upper extremity lymphedema Neuro: nonfocal, well oriented, appropriate affect    LAB RESULTS:  Lab Results  Component Value Date   FERRITIN 47 10/26/2020   FERRITIN 77 10/12/2020   FERRITIN 79 09/28/2020   FERRITIN 128 09/14/2020   FERRITIN 158 08/15/2020  Lab Results  Component Value Date   WBC 6.3 02/15/2021   NEUTROABS 4.3 02/15/2021   HGB 13.9 02/15/2021   HCT 39.8 02/15/2021   MCV 94.1 02/15/2021   PLT 214 02/15/2021      Chemistry      Component Value Date/Time   NA 132 (L) 02/15/2021 1358   NA 136 09/05/2017 1545   NA 135 (L) 01/30/2017 1145   K 4.6 02/15/2021 1358   K 3.5 01/30/2017 1145   CL 98 02/15/2021 1358   CO2 24 02/15/2021 1358   CO2 27 01/30/2017 1145   BUN 21 02/15/2021 1358   BUN 13 09/05/2017 1545   BUN 11.5 01/30/2017 1145   CREATININE 1.26 (H) 02/15/2021 1358   CREATININE 1.1 01/30/2017 1145      Component Value Date/Time   CALCIUM 10.0 02/15/2021 1358   CALCIUM 10.4 01/30/2017 1145   ALKPHOS 67 02/15/2021 1358   ALKPHOS 57 01/30/2017 1145   AST 17 02/15/2021 1358   AST 22 01/30/2017 1145   ALT 14 02/15/2021 1358   ALT 22 01/30/2017 1145   BILITOT 1.3 (H) 02/15/2021 1358   BILITOT 1.59 (H) 01/30/2017 1145       No results found for: LABCA2  No components found for: LABCA125  No results for input(s): INR in the last 168 hours.  Urinalysis    Component Value Date/Time   COLORURINE YELLOW 07/15/2007 1041   APPEARANCEUR CLEAR 07/15/2007 1041   LABSPEC 1.007 07/15/2007 1041   PHURINE 7.5 07/15/2007 1041   GLUCOSEU NEGATIVE 07/15/2007 1041   HGBUR TRACE (A) 07/15/2007 1041   BILIRUBINUR NEGATIVE 07/15/2007 1041   KETONESUR NEGATIVE 07/15/2007 1041   PROTEINUR NEGATIVE 07/15/2007 1041   UROBILINOGEN 0.2 07/15/2007 1041   NITRITE NEGATIVE 07/15/2007 1041   LEUKOCYTESUR NEGATIVE  07/15/2007 1041    STUDIES: No results found.   ASSESSMENT: 85 y.o. [place] man with a history of hemochromatosis diagnosed in 1989, treated with intermittent phlebotomies.   (1) also history of abdominal aortic aneurysm, status post stent placement  (2) history of irritable bowel syndrome, intermittently symptomatic  (3) atrial fibrillation, on apixaban  (4) poor access, but refuses PICC during phlebotomy   PLAN:  Eugene [name] 's hemochromatosis continues to be well controlled.  We checked his labs every 3 months and when his ferritin gets over 150 we institute phlebotomy and continue that until the ferritin is less than 50.  There is an issue with access.  So far he has preferred not to have a PICC line placed but we can consider that again next time he needs phlebotomy as it has become increasingly difficult to phlebotomize him.  We discussed his wife's situation and I recommended he read "the 36-hour day" for background and information.  He does have good family support from his own children particularly.  Otherwise he will see [place] again in 6 months.  He knows to call for any other issue that may develop before then.   Total encounter time 20 minutes.*   Dee Maday, Valentino Hue, MD  02/15/21 2:33 PM Medical Oncology and Hematology [facility] [address] [place], [place] [address] Tel. [phone]    Fax. [phone]    I, Mickie Bail, am acting as scribe for Dr. Valentino Hue. Aysa Larivee.  I, Ruthann Cancer MD, have reviewed the above documentation for accuracy and completeness, and I agree with the above.   *Total Encounter Time as defined by the Centers for Medicare and Medicaid Services includes, in addition  to the face-to-face time of a patient visit (documented in the note above) non-face-to-face time: obtaining and reviewing outside history, ordering and reviewing medications, tests or procedures, care coordination (communications with other health care  professionals or caregivers) and documentation in the medical record.

## 2021-03-24 ENCOUNTER — Inpatient Hospital Stay (HOSPITAL_COMMUNITY): Payer: Medicare Other

## 2021-03-24 ENCOUNTER — Inpatient Hospital Stay (HOSPITAL_COMMUNITY)
Admission: EM | Admit: 2021-03-24 | Discharge: 2021-04-02 | DRG: 643 | Disposition: A | Discharge status: Skilled Nursing Facility | Payer: Medicare Other | Attending: Internal Medicine | Admitting: Internal Medicine

## 2021-03-24 ENCOUNTER — Emergency Department (HOSPITAL_COMMUNITY): Payer: Medicare Other

## 2021-03-24 ENCOUNTER — Encounter (HOSPITAL_COMMUNITY): Payer: Self-pay | Admitting: Emergency Medicine

## 2021-03-24 DIAGNOSIS — Z8673 Personal history of transient ischemic attack (TIA), and cerebral infarction without residual deficits: Secondary | ICD-10-CM

## 2021-03-24 DIAGNOSIS — F32A Depression, unspecified: Secondary | ICD-10-CM | POA: Diagnosis present

## 2021-03-24 DIAGNOSIS — S72115A Nondisplaced fracture of greater trochanter of left femur, initial encounter for closed fracture: Secondary | ICD-10-CM

## 2021-03-24 DIAGNOSIS — E860 Dehydration: Secondary | ICD-10-CM | POA: Diagnosis present

## 2021-03-24 DIAGNOSIS — M1612 Unilateral primary osteoarthritis, left hip: Secondary | ICD-10-CM | POA: Diagnosis present

## 2021-03-24 DIAGNOSIS — Z8249 Family history of ischemic heart disease and other diseases of the circulatory system: Secondary | ICD-10-CM | POA: Diagnosis not present

## 2021-03-24 DIAGNOSIS — W1830XA Fall on same level, unspecified, initial encounter: Secondary | ICD-10-CM | POA: Diagnosis present

## 2021-03-24 DIAGNOSIS — E861 Hypovolemia: Secondary | ICD-10-CM | POA: Diagnosis present

## 2021-03-24 DIAGNOSIS — I10 Essential (primary) hypertension: Secondary | ICD-10-CM | POA: Diagnosis present

## 2021-03-24 DIAGNOSIS — R52 Pain, unspecified: Secondary | ICD-10-CM

## 2021-03-24 DIAGNOSIS — I4821 Permanent atrial fibrillation: Secondary | ICD-10-CM | POA: Diagnosis present

## 2021-03-24 DIAGNOSIS — D72828 Other elevated white blood cell count: Secondary | ICD-10-CM | POA: Diagnosis present

## 2021-03-24 DIAGNOSIS — K219 Gastro-esophageal reflux disease without esophagitis: Secondary | ICD-10-CM | POA: Diagnosis present

## 2021-03-24 DIAGNOSIS — M25512 Pain in left shoulder: Secondary | ICD-10-CM | POA: Diagnosis not present

## 2021-03-24 DIAGNOSIS — Z87891 Personal history of nicotine dependence: Secondary | ICD-10-CM

## 2021-03-24 DIAGNOSIS — Z9049 Acquired absence of other specified parts of digestive tract: Secondary | ICD-10-CM

## 2021-03-24 DIAGNOSIS — E876 Hypokalemia: Principal | ICD-10-CM

## 2021-03-24 DIAGNOSIS — M25519 Pain in unspecified shoulder: Secondary | ICD-10-CM

## 2021-03-24 DIAGNOSIS — Z20822 Contact with and (suspected) exposure to covid-19: Secondary | ICD-10-CM | POA: Diagnosis present

## 2021-03-24 DIAGNOSIS — E222 Syndrome of inappropriate secretion of antidiuretic hormone: Secondary | ICD-10-CM | POA: Diagnosis present

## 2021-03-24 DIAGNOSIS — Z79899 Other long term (current) drug therapy: Secondary | ICD-10-CM

## 2021-03-24 DIAGNOSIS — R296 Repeated falls: Secondary | ICD-10-CM | POA: Diagnosis present

## 2021-03-24 DIAGNOSIS — F419 Anxiety disorder, unspecified: Secondary | ICD-10-CM | POA: Diagnosis present

## 2021-03-24 DIAGNOSIS — E878 Other disorders of electrolyte and fluid balance, not elsewhere classified: Secondary | ICD-10-CM | POA: Diagnosis present

## 2021-03-24 DIAGNOSIS — R7989 Other specified abnormal findings of blood chemistry: Secondary | ICD-10-CM | POA: Diagnosis not present

## 2021-03-24 DIAGNOSIS — I959 Hypotension, unspecified: Secondary | ICD-10-CM | POA: Diagnosis present

## 2021-03-24 DIAGNOSIS — S72115S Nondisplaced fracture of greater trochanter of left femur, sequela: Secondary | ICD-10-CM | POA: Diagnosis not present

## 2021-03-24 DIAGNOSIS — E785 Hyperlipidemia, unspecified: Secondary | ICD-10-CM | POA: Diagnosis present

## 2021-03-24 DIAGNOSIS — S4981XA Other specified injuries of right shoulder and upper arm, initial encounter: Secondary | ICD-10-CM | POA: Diagnosis present

## 2021-03-24 DIAGNOSIS — E871 Hypo-osmolality and hyponatremia: Secondary | ICD-10-CM

## 2021-03-24 DIAGNOSIS — W19XXXA Unspecified fall, initial encounter: Secondary | ICD-10-CM

## 2021-03-24 DIAGNOSIS — T502X5A Adverse effect of carbonic-anhydrase inhibitors, benzothiadiazides and other diuretics, initial encounter: Secondary | ICD-10-CM | POA: Diagnosis present

## 2021-03-24 DIAGNOSIS — Z7901 Long term (current) use of anticoagulants: Secondary | ICD-10-CM | POA: Diagnosis not present

## 2021-03-24 DIAGNOSIS — R799 Abnormal finding of blood chemistry, unspecified: Secondary | ICD-10-CM | POA: Diagnosis not present

## 2021-03-24 DIAGNOSIS — M25511 Pain in right shoulder: Secondary | ICD-10-CM | POA: Diagnosis not present

## 2021-03-24 DIAGNOSIS — S72113A Displaced fracture of greater trochanter of unspecified femur, initial encounter for closed fracture: Secondary | ICD-10-CM

## 2021-03-24 LAB — HEPATIC FUNCTION PANEL
ALT: 32 U/L (ref 0–44)
AST: 65 U/L — ABNORMAL HIGH (ref 15–41)
Albumin: 3.7 g/dL (ref 3.5–5.0)
Alkaline Phosphatase: 52 U/L (ref 38–126)
Bilirubin, Direct: 0.6 mg/dL — ABNORMAL HIGH (ref 0.0–0.2)
Indirect Bilirubin: 2.7 mg/dL — ABNORMAL HIGH (ref 0.3–0.9)
Total Bilirubin: 3.3 mg/dL — ABNORMAL HIGH (ref 0.3–1.2)
Total Protein: 6.3 g/dL — ABNORMAL LOW (ref 6.5–8.1)

## 2021-03-24 LAB — BASIC METABOLIC PANEL
Anion gap: 15 (ref 5–15)
Anion gap: 15 (ref 5–15)
Anion gap: 13 (ref 5–15)
BUN: 15 mg/dL (ref 8–23)
BUN: 16 mg/dL (ref 8–23)
BUN: 16 mg/dL (ref 8–23)
CO2: 21 mmol/L — ABNORMAL LOW (ref 22–32)
CO2: 22 mmol/L (ref 22–32)
CO2: 23 mmol/L (ref 22–32)
Calcium: 9 mg/dL (ref 8.9–10.3)
Calcium: 9.3 mg/dL (ref 8.9–10.3)
Calcium: 8.8 mg/dL — ABNORMAL LOW (ref 8.9–10.3)
Chloride: 78 mmol/L — ABNORMAL LOW (ref 98–111)
Chloride: 82 mmol/L — ABNORMAL LOW (ref 98–111)
Chloride: 83 mmol/L — ABNORMAL LOW (ref 98–111)
Creatinine, Ser: 1.13 mg/dL (ref 0.61–1.24)
Creatinine, Ser: 1.07 mg/dL (ref 0.61–1.24)
Creatinine, Ser: 1.08 mg/dL (ref 0.61–1.24)
GFR, Estimated: >60 mL/min (ref >60)
GFR, Estimated: >60 mL/min (ref >60)
GFR, Estimated: >60 mL/min (ref >60)
Glucose, Bld: 121 mg/dL — ABNORMAL HIGH (ref 70–99)
Glucose, Bld: 112 mg/dL — ABNORMAL HIGH (ref 70–99)
Glucose, Bld: 136 mg/dL — ABNORMAL HIGH (ref 70–99)
Potassium: 2.8 mmol/L — ABNORMAL LOW (ref 3.5–5.1)
Potassium: 3.1 mmol/L — ABNORMAL LOW (ref 3.5–5.1)
Potassium: 2.9 mmol/L — ABNORMAL LOW (ref 3.5–5.1)
Sodium: 114 mmol/L — CL (ref 135–145)
Sodium: 119 mmol/L — CL (ref 135–145)
Sodium: 119 mmol/L — CL (ref 135–145)

## 2021-03-24 LAB — URINALYSIS, ROUTINE W REFLEX MICROSCOPIC
Bacteria, UA: NONE SEEN
Bilirubin Urine: NEGATIVE
Glucose, UA: NEGATIVE mg/dL
Ketones, ur: 20 mg/dL — AB
Leukocytes,Ua: NEGATIVE
Nitrite: NEGATIVE
Protein, ur: 30 mg/dL — AB
Specific Gravity, Urine: 1.009 (ref 1.005–1.030)
pH: 7 (ref 5.0–8.0)

## 2021-03-24 LAB — CBC
HCT: 35.8 % — ABNORMAL LOW (ref 39.0–52.0)
Hemoglobin: 13.3 g/dL (ref 13.0–17.0)
MCH: 34 pg (ref 26.0–34.0)
MCHC: 37.2 g/dL — ABNORMAL HIGH (ref 30.0–36.0)
MCV: 91.6 fL (ref 80.0–100.0)
Platelets: 225 10*3/uL (ref 150–400)
RBC: 3.91 MIL/uL — ABNORMAL LOW (ref 4.22–5.81)
RDW: 13.2 % (ref 11.5–15.5)
WBC: 11.1 10*3/uL — ABNORMAL HIGH (ref 4.0–10.5)
nRBC: 0 % (ref 0.0–0.2)

## 2021-03-24 LAB — TROPONIN I (HIGH SENSITIVITY)
Troponin I (High Sensitivity): 34 ng/L — ABNORMAL HIGH (ref <18)
Troponin I (High Sensitivity): 28 ng/L — ABNORMAL HIGH (ref <18)

## 2021-03-24 LAB — OSMOLALITY, URINE: Osmolality, Ur: 316 mOsm/kg (ref 300–900)

## 2021-03-24 LAB — CBG MONITORING, ED: Glucose-Capillary: 120 mg/dL — ABNORMAL HIGH (ref 70–99)

## 2021-03-24 LAB — RESP PANEL BY RT-PCR (FLU A&B, COVID) ARPGX2
Influenza A by PCR: NEGATIVE
Influenza B by PCR: NEGATIVE
SARS Coronavirus 2 by RT PCR: NEGATIVE

## 2021-03-24 LAB — PROTIME-INR
INR: 1.3 — ABNORMAL HIGH (ref 0.8–1.2)
Prothrombin Time: 15.8 seconds — ABNORMAL HIGH (ref 11.4–15.2)

## 2021-03-24 LAB — MAGNESIUM: Magnesium: 1.6 mg/dL — ABNORMAL LOW (ref 1.7–2.4)

## 2021-03-24 LAB — OSMOLALITY: Osmolality: 253 mOsm/kg — ABNORMAL LOW (ref 275–295)

## 2021-03-24 MED ORDER — PANTOPRAZOLE SODIUM 20 MG PO TBEC: 40.0000 mg | DELAYED_RELEASE_TABLET | Freq: Every day | ORAL | Status: DC

## 2021-03-24 MED ORDER — METOPROLOL TARTRATE 25 MG PO TABS
25.0000 mg | ORAL_TABLET | Freq: Two times a day (BID) | ORAL | Status: DC
  Administered 2021-03-24: 25 mg via ORAL
  Filled 2021-03-24 (×2): qty 1

## 2021-03-24 MED ORDER — TRAMADOL HCL 50 MG PO TABS
50.0000 mg | ORAL_TABLET | Freq: Four times a day (QID) | ORAL | Status: DC | PRN
  Administered 2021-03-25 – 2021-04-02 (×7): 50 mg via ORAL
  Filled 2021-03-24 (×7): qty 1

## 2021-03-24 MED ORDER — RAMIPRIL 5 MG PO CAPS
5.0000 mg | ORAL_CAPSULE | Freq: Every day | ORAL | Status: DC
  Filled 2021-03-24: qty 1

## 2021-03-24 MED ORDER — POTASSIUM CHLORIDE 10 MEQ/100ML IV SOLN
10.0000 meq | INTRAVENOUS | Status: AC
Start: 2021-03-24 — End: 2021-03-25
  Administered 2021-03-24 (×3): 10 meq via INTRAVENOUS
  Filled 2021-03-24 (×2): qty 100

## 2021-03-24 MED ORDER — TRAZODONE HCL 50 MG PO TABS
50.0000 mg | ORAL_TABLET | Freq: Every evening | ORAL | Status: DC | PRN
  Administered 2021-03-26 – 2021-04-02 (×4): 50 mg via ORAL
  Filled 2021-03-24 (×4): qty 1

## 2021-03-24 MED ORDER — POTASSIUM CHLORIDE 10 MEQ/100ML IV SOLN
INTRAVENOUS | Status: AC
  Administered 2021-03-25: 10 meq via INTRAVENOUS
  Filled 2021-03-24: qty 100

## 2021-03-24 MED ORDER — APIXABAN 5 MG PO TABS
5.0000 mg | ORAL_TABLET | Freq: Two times a day (BID) | ORAL | Status: DC
  Administered 2021-03-24 – 2021-04-02 (×18): 5 mg via ORAL
  Filled 2021-03-24 (×18): qty 1

## 2021-03-24 MED ORDER — AMLODIPINE BESYLATE 10 MG PO TABS
10.0000 mg | ORAL_TABLET | Freq: Every day | ORAL | Status: DC
  Filled 2021-03-24: qty 1

## 2021-03-24 MED ORDER — ALPRAZOLAM 0.5 MG PO TABS
0.5000 mg | ORAL_TABLET | Freq: Two times a day (BID) | ORAL | Status: DC
  Administered 2021-03-24 – 2021-04-02 (×18): 0.5 mg via ORAL
  Filled 2021-03-24 (×16): qty 1
  Filled 2021-03-24: qty 2
  Filled 2021-03-24 (×2): qty 1

## 2021-03-24 MED ORDER — ATORVASTATIN CALCIUM 10 MG PO TABS
20.0000 mg | ORAL_TABLET | Freq: Every day | ORAL | Status: DC
  Administered 2021-03-24 – 2021-03-30 (×7): 20 mg via ORAL
  Filled 2021-03-24 (×7): qty 2

## 2021-03-24 MED ORDER — MAGNESIUM SULFATE IN D5W 1-5 GM/100ML-% IV SOLN
1.0000 g | Freq: Once | INTRAVENOUS | Status: AC
  Administered 2021-03-24: 1 g via INTRAVENOUS
  Filled 2021-03-24: qty 100

## 2021-03-24 MED ORDER — PANTOPRAZOLE SODIUM 40 MG PO TBEC
40.0000 mg | DELAYED_RELEASE_TABLET | Freq: Every day | ORAL | Status: DC
  Administered 2021-03-25: 40 mg via ORAL
  Filled 2021-03-24: qty 1

## 2021-03-24 NOTE — ED Notes (Signed)
Patient transported to X-ray 

## 2021-03-24 NOTE — ED Provider Notes (Signed)
Emergency Medicine Provider Triage Evaluation Note  Eugene [name] , a 85 y.o. male  was evaluated in triage.  Pt complains of weakness.  He has fallen about 5 times in the past two days.  He did hit his head when he fell about 4 days ago but nothing in the past few days.  He does report pain in his left hip and right shoulder since falling.  No prodromal symptoms before falling just states he bends over.  Poor PO intake over the past few days but no N/V/D.    Review of Systems  Positive: Falls, hit head, weakness Negative: CP, HA  Physical Exam  BP 112/75 (BP Location: Left Arm)   Pulse (!) 107   Temp 98.5 F (36.9 C) (Oral)   Resp 16   SpO2 98%  Gen:   Awake, no distress  Resp:  Normal effort  MSK:   Moves extremities without difficulty No pronator drift.  Able to lift legs bilaterally.  Other:  Normal speech  Medical Decision Making  Medically screening exam initiated at 2:51 PM.  Appropriate orders placed.  [name] was informed that the remainder of the evaluation will be completed by another provider, this initial triage assessment does not replace that evaluation, and the importance of remaining in the ED until their evaluation is complete.  Patient did hit head a few days ago.  He is anticoagulated.  Given it has been multiple days since he hit his head will not call level 2 trauma.  CT head and neck ordered.  He doesn't meet any LVO criteria at this time.  Called CT scan to expedite scan.     Cristina Gong, PA-C 03/24/21 1509    [name], MD 03/24/21 1910

## 2021-03-24 NOTE — ED Notes (Signed)
Lab called with critical value: Na 119. MD paged and notified. Will continue to monitor.

## 2021-03-24 NOTE — Consult Note (Signed)
ORTHOPAEDIC CONSULTATION  REQUESTING PHYSICIAN: Milagros Loll, MD  PCP:  [name], MD  Chief Complaint: Frequent falls  HPI: Eugene [name] is a 85 y.o. male who complains of some left hip tenderness after having frequent falls at home. The patient is being seen in the emergency department due to frequent falls. He was evaluated for possible hip fracture due to left hip tenderness. Orthopedics was consulted for management and treatment of a left greater trochanter fracture. The patient was admitted by the hospitalist for medical management of abnormal lab values.  Past Medical History:  Diagnosis Date   AAA (abdominal aortic aneurysm) 06/29/2007   stent graft   Aortic stenosis 09/05/2017   Mild AS/AR by echo 01/2017.   GERD (gastroesophageal reflux disease)    Hemochromatosis    Hyperlipidemia    Hypertension    Irritable bowel syndrome    Permanent atrial fibrillation (HCC)    Stroke (HCC)    Transient Ischemic Attack   Past Surgical History:  Procedure Laterality Date   ABDOMINAL AORTIC ANEURYSM REPAIR     ENDOVASCULAR STENT INSERTION  07/18/2007   saccular infrarenal aortic aneurysm   EYE SURGERY Bilateral    Cataract     IR FLUORO GUIDE CV LINE LEFT  12/08/2018   IR FLUORO GUIDE CV LINE RIGHT  02/13/2017   IR FLUORO GUIDE CV LINE RIGHT  02/25/2017   IR US GUIDE VASC ACCESS RIGHT  02/13/2017   PICC LINE PLACE PERIPHERAL ([facility] HX)  Oct 21, 2014   Social History   Socioeconomic History   Marital status: Married    Spouse name: Not on file   Number of children: Not on file   Years of education: Not on file   Highest education level: Not on file  Occupational History   Not on file  Tobacco Use   Smoking status: Former    Types: Cigarettes    Quit date: 06/18/1999    Years since quitting: 21.7   Smokeless tobacco: Never  Substance and Sexual Activity   Alcohol use: Yes    Alcohol/week: 1.0 - 2.0 standard drink    Types: 1 - 2 Standard drinks or  equivalent per week   Drug use: No   Sexual activity: Not on file  Other Topics Concern   Not on file  Social History Narrative   Not on file   Social Determinants of Health   Financial Resource Strain: Not on file  Food Insecurity: Not on file  Transportation Needs: Not on file  Physical Activity: Not on file  Stress: Not on file  Social Connections: Not on file   Family History  Problem Relation Age of Onset   Hypertension Mother    Heart disease Mother        AAA, he passed in his 66 s of Heart Diseasse   Heart disease Father        AAA history and he passed at age 13  of   Disease   Allergies  Allergen Reactions   Chlorhexidine Gluconate Itching    Don't use Chloraprep inside port a cath kits.   Macrolides And Ketolides Nausea And Vomiting   Tape     Adhesive tape,   Only use paper tape   Prior to Admission medications   Medication Sig Start Date End Date Taking? Authorizing Provider  ALPRAZolam Prudy Feeler) 1 MG tablet Take 0.5 mg by mouth 2 (two) times daily. For anxiety 02/01/15  Yes [provider]  amLODipine (NORVASC) 10 MG tablet TAKE 1 TABLET BY MOUTH EVERY DAY Patient taking differently: Take 10 mg by mouth daily. 06/07/20  Yes [name], Cornelious Bryant, MD  Ascorbic Acid (VITAMIN C) 100 MG tablet Take 1,000 mg by mouth daily.   Yes [provider]  atorvastatin (LIPITOR) 20 MG tablet Take 20 mg by mouth daily.  10/23/14  Yes [provider]  Cholecalciferol (VITAMIN D3) 25 MCG (1000 UT) CAPS Take 1,000 Units by mouth daily.   Yes [provider]  ELIQUIS 5 MG TABS tablet TAKE 1 TABLET BY MOUTH TWICE A DAY Patient taking differently: Take 5 mg by mouth 2 (two) times daily. 12/22/20  Yes [name], Cornelious Bryant, MD  fluticasone (FLONASE) 50 MCG/ACT nasal spray Place 2 sprays into both nostrils daily as needed for allergies or rhinitis.   Yes [provider]  hydrochlorothiazide (HYDRODIURIL) 25 MG tablet Take 25 mg by mouth daily.   Yes  [provider]  hyoscyamine (LEVBID) 0.375 MG 12 hr tablet Take 0.375 mg by mouth daily as needed for cramping.   Yes [provider]  KLOR-CON M20 20 MEQ tablet TAKE 1 TABLET BY MOUTH DAILY. (PLEASE KEEP APPT IN OCT. WITH DR. Mayford Knife BEFORE ANYMORE REFILLS.) Patient taking differently: Take 20 mEq by mouth daily. 04/18/20  Yes [name], Cornelious Bryant, MD  Loperamide HCl (IMODIUM PO) Take 4 mg by mouth daily as needed (loose stool).   Yes [provider]  metoprolol tartrate (LOPRESSOR) 25 MG tablet Take 25 mg by mouth 2 (two) times daily.  02/01/14  Yes [provider]  omeprazole (PRILOSEC OTC) 20 MG tablet Take 20 mg by mouth daily.   Yes [provider]  ramipril (ALTACE) 5 MG capsule Take 5 mg by mouth daily.  09/23/14  Yes [provider]  Thiamine HCl (VITAMIN B-1 PO) Take 250 mg by mouth daily.   Yes [provider]  tiZANidine (ZANAFLEX) 2 MG tablet Take 2 mg by mouth at bedtime as needed for muscle spasms.   Yes [provider]  traZODone (DESYREL) 50 MG tablet Take 50 mg by mouth at bedtime as needed for sleep.   Yes [provider]  Vitamin E 400 units TABS Take 400 Units by mouth daily.   Yes [provider]   DG Chest 2 View  Result Date: 03/24/2021 CLINICAL DATA:  Weakness. EXAM: CHEST - 2 VIEW COMPARISON:  Radiographs 05/26/2019.  CT 11/06/2005. FINDINGS: The heart size and mediastinal contours are stable with aortic atherosclerosis. There is mild left basilar atelectasis. The lungs otherwise appear clear, without confluent airspace opacity, edema or pleural effusion. No acute fractures are seen. There are mild degenerative changes in the spine. IMPRESSION: No evidence of active cardiopulmonary process. Electronically Signed   By: Carey Bullocks M.D.   On: 03/24/2021 15:33   CT HEAD WO CONTRAST ( )  Result Date: 03/24/2021 CLINICAL DATA:  Generalized weakness that started 1 week ago. Multiple falls.  EXAM: CT HEAD WITHOUT CONTRAST CT CERVICAL SPINE WITHOUT CONTRAST TECHNIQUE: Multidetector CT imaging of the head and cervical spine was performed following the standard protocol without intravenous contrast. Multiplanar CT image reconstructions of the cervical spine were also generated. COMPARISON:  None. FINDINGS: CT HEAD FINDINGS Brain: There is no evidence of acute intracranial hemorrhage, mass lesion, brain edema or extra-axial fluid collection. Mild atrophy with prominence of the ventricles and subarachnoid spaces. The dilatation of the lateral and 3rd ventricles is in slight excess of the sulcal prominence, but  there is no definite hydrocephalus. Mild patchy low-density in the periventricular white matter likely reflects chronic small vessel ischemic changes. There is no CT evidence of acute cortical infarction. Vascular: Intracranial vascular calcifications. No hyperdense vessel identified. Skull: Negative for fracture or focal lesion. Sinuses/Orbits: The visualized paranasal sinuses and mastoid air cells are clear. No orbital abnormalities are seen. Other: None. CT CERVICAL SPINE FINDINGS Alignment: Normal. Skull base and vertebrae: No evidence of acute cervical spine fracture or traumatic subluxation. Soft tissues and spinal canal: No prevertebral fluid or swelling. No visible canal hematoma. There is synovial calcifications surrounding the odontoid process without osseous erosion or mass effect on the craniocervical junction. Disc levels: Multilevel cervical spondylosis with disc bulging, uncinate spurring and facet hypertrophy. There is mild to moderate resulting osseous foraminal narrowing which appears worst at the C4-5 and C6-7 levels. Upper chest: Emphysematous changes and mild scarring at both lung apices. Severe bilateral carotid atherosclerosis. Other: None. IMPRESSION: 1. No acute intracranial or calvarial findings. 2. Atrophy and mild chronic small vessel ischemic changes. 3. No evidence of  acute cervical spine fracture, traumatic subluxation or static signs of instability. 4. Multilevel cervical spondylosis. Electronically Signed   By: Carey Bullocks M.D.   On: 03/24/2021 15:59   CT Cervical Spine Wo Contrast  Result Date: 03/24/2021 CLINICAL DATA:  Generalized weakness that started 1 week ago. Multiple falls. EXAM: CT HEAD WITHOUT CONTRAST CT CERVICAL SPINE WITHOUT CONTRAST TECHNIQUE: Multidetector CT imaging of the head and cervical spine was performed following the standard protocol without intravenous contrast. Multiplanar CT image reconstructions of the cervical spine were also generated. COMPARISON:  None. FINDINGS: CT HEAD FINDINGS Brain: There is no evidence of acute intracranial hemorrhage, mass lesion, brain edema or extra-axial fluid collection. Mild atrophy with prominence of the ventricles and subarachnoid spaces. The dilatation of the lateral and 3rd ventricles is in slight excess of the sulcal prominence, but there is no definite hydrocephalus. Mild patchy low-density in the periventricular white matter likely reflects chronic small vessel ischemic changes. There is no CT evidence of acute cortical infarction. Vascular: Intracranial vascular calcifications. No hyperdense vessel identified. Skull: Negative for fracture or focal lesion. Sinuses/Orbits: The visualized paranasal sinuses and mastoid air cells are clear. No orbital abnormalities are seen. Other: None. CT CERVICAL SPINE FINDINGS Alignment: Normal. Skull base and vertebrae: No evidence of acute cervical spine fracture or traumatic subluxation. Soft tissues and spinal canal: No prevertebral fluid or swelling. No visible canal hematoma. There is synovial calcifications surrounding the odontoid process without osseous erosion or mass effect on the craniocervical junction. Disc levels: Multilevel cervical spondylosis with disc bulging, uncinate spurring and facet hypertrophy. There is mild to moderate resulting osseous  foraminal narrowing which appears worst at the C4-5 and C6-7 levels. Upper chest: Emphysematous changes and mild scarring at both lung apices. Severe bilateral carotid atherosclerosis. Other: None. IMPRESSION: 1. No acute intracranial or calvarial findings. 2. Atrophy and mild chronic small vessel ischemic changes. 3. No evidence of acute cervical spine fracture, traumatic subluxation or static signs of instability. 4. Multilevel cervical spondylosis. Electronically Signed   By: Carey Bullocks M.D.   On: 03/24/2021 15:59   DG Hip Unilat W or Wo Pelvis 2-3 Views Left  Result Date: 03/24/2021 CLINICAL DATA:  Weakness. EXAM: DG HIP (WITH OR WITHOUT PELVIS) 2-3V LEFT COMPARISON:  Abdominal radiographs 08/02/2016. Abdominopelvic CT 03/09/2013. FINDINGS: The mineralization and alignment are normal. There are advanced degenerative changes of both hips which have progressed compared with the prior studies.  There are associated osteophytes, subchondral cysts and subchondral sclerosis. There is a nondisplaced fracture of the left greater trochanter. No definite extension of this fracture cross the femoral neck or into the intertrochanteric region is identified. There are diffuse vascular calcifications status post aorto bi-iliac stent grafting. Patient is status post ventral hernia repair. IMPRESSION: 1. Acute fracture of the left greater trochanter. If the patient is unable to bear weight or there is additional clinical concern for intertrochanteric extension of this fracture, consider further evaluation with CT. 2. Advanced osteoarthritic changes of both hips. Electronically Signed   By: Carey Bullocks M.D.   On: 03/24/2021 15:37    Positive ROS: All other systems have been reviewed and were otherwise negative with the exception of those mentioned in the HPI and as above.  Physical Exam: General: Alert, no acute distress Cardiovascular: No pedal edema Respiratory: No cyanosis, no use of accessory  musculature GI: No organomegaly, abdomen is soft and non-tender Skin: No lesions in the area of chief complaint Neurologic: Sensation intact distally Psychiatric: Patient is competent for consent with normal mood and affect Lymphatic: No axillary or cervical lymphadenopathy  MUSCULOSKELETAL: Left hip painless ROM. No shortening or rotation. Pedal pulse intact. Plantar and dorsiflexion intact  Assessment: Acute fracture of the left greater trochanter Osteoarthritis of left hip, severe  Plan: Left greater trochanter fracture: The patient is able to bear weight and has very little if any pain with passive range of motion of the left hip.  This injury is treated nonoperatively using crutches or walker for ambulatory assistance. WBAT. Avoid hip abduction Follow up in six weeks with orthopedics.      Darrick Grinder, PA [phone]    03/24/2021 6:31 PM

## 2021-03-24 NOTE — ED Notes (Signed)
Pt given sandwich bag and water per request. No acute changes noted. Will continue to monitor.

## 2021-03-24 NOTE — ED Provider Notes (Addendum)
[facility] EMERGENCY DEPARTMENT Provider Note   CSN: [id number] Arrival date & time: 03/24/21  1232     History Chief Complaint  Patient presents with   Weakness    Eugene [name] is a 85 y.o. male.  Presented to ER with concern for weakness.  Patient reports that over the last week or so he has been feeling generally weak, fatigued.  States that he has had issues with his balance, seems to be falling frequently.  Denies any room spinning sensation.  Does not currently feel lightheaded or dizzy.  Thinks that he has fallen about 5 times in the last couple days.  Did not hit head.  No LOC.  No nausea or vomiting.  Has noted some pain to his left hip but has been able to ambulate since falling.  HPI     Past Medical History:  Diagnosis Date   AAA (abdominal aortic aneurysm) 06/29/2007   stent graft   Aortic stenosis 09/05/2017   Mild AS/AR by echo 01/2017.   GERD (gastroesophageal reflux disease)    Hemochromatosis    Hyperlipidemia    Hypertension    Irritable bowel syndrome    Permanent atrial fibrillation (HCC)    Stroke [facility])    Transient Ischemic Attack    Patient Active Problem List   Diagnosis Date Noted   Aortic stenosis 09/05/2017   PICC (peripherally inserted central catheter) flush 02/19/2017   Benign essential HTN 02/10/2014   Permanent atrial fibrillation (HCC)    GERD (gastroesophageal reflux disease)    Aftercare following surgery of the circulatory system, NEC 10/13/2012   Occlusion and stenosis of carotid artery without mention of cerebral infarction 09/03/2011   Abdominal aneurysm without mention of rupture 09/03/2011   Hemochromatosis 08/29/2011    Past Surgical History:  Procedure Laterality Date   ABDOMINAL AORTIC ANEURYSM REPAIR     ENDOVASCULAR STENT INSERTION  07/18/2007   saccular infrarenal aortic aneurysm   EYE SURGERY Bilateral    Cataract     IR FLUORO GUIDE CV LINE LEFT  12/08/2018   IR FLUORO GUIDE CV LINE RIGHT   02/13/2017   IR FLUORO GUIDE CV LINE RIGHT  02/25/2017   IR US GUIDE VASC ACCESS RIGHT  02/13/2017   PICC LINE PLACE PERIPHERAL ([facility] HX)  Oct 21, 2014       Family History  Problem Relation Age of Onset   Hypertension Mother    Heart disease Mother        AAA, he passed in his 38 s of Heart Diseasse   Heart disease Father        AAA history and he passed at age 60  of   Disease    Social History   Tobacco Use   Smoking status: Former    Types: Cigarettes    Quit date: 06/18/1999    Years since quitting: 21.7   Smokeless tobacco: Never  Substance Use Topics   Alcohol use: Yes    Alcohol/week: 1.0 - 2.0 standard drink    Types: 1 - 2 Standard drinks or equivalent per week   Drug use: No    Home Medications Prior to Admission medications   Medication Sig Start Date End Date Taking? Authorizing Provider  ALPRAZolam Prudy Feeler) 1 MG tablet Take 0.5 mg by mouth 2 (two) times daily. For anxiety 02/01/15  Yes [provider]  amLODipine (NORVASC) 10 MG tablet TAKE 1 TABLET BY MOUTH EVERY DAY Patient taking differently: Take 10 mg by  mouth daily. 06/07/20  Yes [name], Cornelious Bryant, MD  Ascorbic Acid (VITAMIN C) 100 MG tablet Take 1,000 mg by mouth daily.   Yes [provider]  atorvastatin (LIPITOR) 20 MG tablet Take 20 mg by mouth daily.  10/23/14  Yes [provider]  Cholecalciferol (VITAMIN D3) 25 MCG (1000 UT) CAPS Take 1,000 Units by mouth daily.   Yes [provider]  ELIQUIS 5 MG TABS tablet TAKE 1 TABLET BY MOUTH TWICE A DAY Patient taking differently: Take 5 mg by mouth 2 (two) times daily. 12/22/20  Yes [name], Cornelious Bryant, MD  fluticasone (FLONASE) 50 MCG/ACT nasal spray Place 2 sprays into both nostrils daily as needed for allergies or rhinitis.   Yes [provider]  hydrochlorothiazide (HYDRODIURIL) 25 MG tablet Take 25 mg by mouth daily.   Yes [provider]  hyoscyamine (LEVBID) 0.375 MG 12 hr tablet Take 0.375 mg by mouth daily  as needed for cramping.   Yes [provider]  KLOR-CON M20 20 MEQ tablet TAKE 1 TABLET BY MOUTH DAILY. (PLEASE KEEP APPT IN OCT. WITH DR. Mayford Knife BEFORE ANYMORE REFILLS.) Patient taking differently: Take 20 mEq by mouth daily. 04/18/20  Yes [name], Cornelious Bryant, MD  Loperamide HCl (IMODIUM PO) Take 4 mg by mouth daily as needed (loose stool).   Yes [provider]  metoprolol tartrate (LOPRESSOR) 25 MG tablet Take 25 mg by mouth 2 (two) times daily.  02/01/14  Yes [provider]  omeprazole (PRILOSEC OTC) 20 MG tablet Take 20 mg by mouth daily.   Yes [provider]  ramipril (ALTACE) 5 MG capsule Take 5 mg by mouth daily.  09/23/14  Yes [provider]  Thiamine HCl (VITAMIN B-1 PO) Take 250 mg by mouth daily.   Yes [provider]  tiZANidine (ZANAFLEX) 2 MG tablet Take 2 mg by mouth at bedtime as needed for muscle spasms.   Yes [provider]  traZODone (DESYREL) 50 MG tablet Take 50 mg by mouth at bedtime as needed for sleep.   Yes [provider]  Vitamin E 400 units TABS Take 400 Units by mouth daily.   Yes [provider]    Allergies    Chlorhexidine gluconate, Macrolides and ketolides, and Tape  Review of Systems   Review of Systems  Constitutional:  Positive for fatigue. Negative for chills and fever.  HENT:  Negative for ear pain and sore throat.   Eyes:  Negative for pain and visual disturbance.  Respiratory:  Negative for cough and shortness of breath.   Cardiovascular:  Negative for chest pain and palpitations.  Gastrointestinal:  Negative for abdominal pain and vomiting.  Genitourinary:  Negative for dysuria and hematuria.  Musculoskeletal:  Positive for arthralgias. Negative for back pain.  Skin:  Negative for color change and rash.  Neurological:  Positive for weakness. Negative for seizures and syncope.  All other systems reviewed and are negative.  Physical Exam Updated Vital Signs BP 119/69    Pulse (!) 119   Temp 98.5 F (36.9 C) (Oral)   Resp 14   SpO2 98%   Physical Exam Vitals and nursing note reviewed.  Constitutional:      Appearance: He is well-developed.  HENT:     Head: Normocephalic and atraumatic.  Eyes:     Conjunctiva/sclera: Conjunctivae normal.  Cardiovascular:     Rate and Rhythm: Normal rate and regular rhythm.     Heart sounds: No murmur heard. Pulmonary:  Effort: Pulmonary effort is normal. No respiratory distress.     Breath sounds: Normal breath sounds.  Abdominal:     Palpations: Abdomen is soft.     Tenderness: There is no abdominal tenderness.  Musculoskeletal:     Cervical back: Neck supple.     Comments: Back: no C, T, L spine TTP, no step off or deformity RUE: no TTP throughout, no deformity, normal joint ROM, radial pulse intact, distal sensation and motor intact LUE: no TTP throughout, no deformity, normal joint ROM, radial pulse intact, distal sensation and motor intact RLE:  no TTP throughout, no deformity, normal joint ROM, distal pulse, sensation and motor intact LLE: Some tenderness to the hip but normal joint ROM, distal pulse, sensation and motor intact  Skin:    General: Skin is warm and dry.  Neurological:     General: No focal deficit present.     Mental Status: He is alert.  Psychiatric:        Mood and Affect: Mood normal.        Behavior: Behavior normal.    ED Results / Procedures / Treatments   Labs (all labs ordered are listed, but only abnormal results are displayed) Labs Reviewed  BASIC METABOLIC PANEL - Abnormal; Notable for the following components:      Result Value   Sodium 114 (*)    Potassium 2.8 (*)    Chloride 78 (*)    CO2 21 (*)    Glucose, Bld 121 (*)    All other components within normal limits  CBC - Abnormal; Notable for the following components:   WBC 11.1 (*)    RBC 3.91 (*)    HCT 35.8 (*)    MCHC 37.2 (*)    All other components within normal limits  URINALYSIS, ROUTINE W REFLEX  MICROSCOPIC - Abnormal; Notable for the following components:   Hgb urine dipstick SMALL (*)    Ketones, ur 20 (*)    Protein, ur 30 (*)    All other components within normal limits  PROTIME-INR - Abnormal; Notable for the following components:   Prothrombin Time 15.8 (*)    INR 1.3 (*)    All other components within normal limits  CBG MONITORING, ED - Abnormal; Notable for the following components:   Glucose-Capillary 120 (*)    All other components within normal limits  HEPATIC FUNCTION PANEL  SODIUM, URINE, RANDOM  CREATININE, URINE, RANDOM  BASIC METABOLIC PANEL  BASIC METABOLIC PANEL  BASIC METABOLIC PANEL  BASIC METABOLIC PANEL  MAGNESIUM  TROPONIN I (HIGH SENSITIVITY)  TROPONIN I (HIGH SENSITIVITY)    EKG None  Radiology DG Chest 2 View  Result Date: 03/24/2021 CLINICAL DATA:  Weakness. EXAM: CHEST - 2 VIEW COMPARISON:  Radiographs 05/26/2019.  CT 11/06/2005. FINDINGS: The heart size and mediastinal contours are stable with aortic atherosclerosis. There is mild left basilar atelectasis. The lungs otherwise appear clear, without confluent airspace opacity, edema or pleural effusion. No acute fractures are seen. There are mild degenerative changes in the spine. IMPRESSION: No evidence of active cardiopulmonary process. Electronically Signed   By: Carey Bullocks M.D.   On: 03/24/2021 15:33   CT HEAD WO CONTRAST ( )  Result Date: 03/24/2021 CLINICAL DATA:  Generalized weakness that started 1 week ago. Multiple falls. EXAM: CT HEAD WITHOUT CONTRAST CT CERVICAL SPINE WITHOUT CONTRAST TECHNIQUE: Multidetector CT imaging of the head and cervical spine was performed following the standard protocol without intravenous contrast. Multiplanar CT image reconstructions of  the cervical spine were also generated. COMPARISON:  None. FINDINGS: CT HEAD FINDINGS Brain: There is no evidence of acute intracranial hemorrhage, mass lesion, brain edema or extra-axial fluid collection. Mild  atrophy with prominence of the ventricles and subarachnoid spaces. The dilatation of the lateral and 3rd ventricles is in slight excess of the sulcal prominence, but there is no definite hydrocephalus. Mild patchy low-density in the periventricular white matter likely reflects chronic small vessel ischemic changes. There is no CT evidence of acute cortical infarction. Vascular: Intracranial vascular calcifications. No hyperdense vessel identified. Skull: Negative for fracture or focal lesion. Sinuses/Orbits: The visualized paranasal sinuses and mastoid air cells are clear. No orbital abnormalities are seen. Other: None. CT CERVICAL SPINE FINDINGS Alignment: Normal. Skull base and vertebrae: No evidence of acute cervical spine fracture or traumatic subluxation. Soft tissues and spinal canal: No prevertebral fluid or swelling. No visible canal hematoma. There is synovial calcifications surrounding the odontoid process without osseous erosion or mass effect on the craniocervical junction. Disc levels: Multilevel cervical spondylosis with disc bulging, uncinate spurring and facet hypertrophy. There is mild to moderate resulting osseous foraminal narrowing which appears worst at the C4-5 and C6-7 levels. Upper chest: Emphysematous changes and mild scarring at both lung apices. Severe bilateral carotid atherosclerosis. Other: None. IMPRESSION: 1. No acute intracranial or calvarial findings. 2. Atrophy and mild chronic small vessel ischemic changes. 3. No evidence of acute cervical spine fracture, traumatic subluxation or static signs of instability. 4. Multilevel cervical spondylosis. Electronically Signed   By: Carey Bullocks M.D.   On: 03/24/2021 15:59   CT Cervical Spine Wo Contrast  Result Date: 03/24/2021 CLINICAL DATA:  Generalized weakness that started 1 week ago. Multiple falls. EXAM: CT HEAD WITHOUT CONTRAST CT CERVICAL SPINE WITHOUT CONTRAST TECHNIQUE: Multidetector CT imaging of the head and cervical  spine was performed following the standard protocol without intravenous contrast. Multiplanar CT image reconstructions of the cervical spine were also generated. COMPARISON:  None. FINDINGS: CT HEAD FINDINGS Brain: There is no evidence of acute intracranial hemorrhage, mass lesion, brain edema or extra-axial fluid collection. Mild atrophy with prominence of the ventricles and subarachnoid spaces. The dilatation of the lateral and 3rd ventricles is in slight excess of the sulcal prominence, but there is no definite hydrocephalus. Mild patchy low-density in the periventricular white matter likely reflects chronic small vessel ischemic changes. There is no CT evidence of acute cortical infarction. Vascular: Intracranial vascular calcifications. No hyperdense vessel identified. Skull: Negative for fracture or focal lesion. Sinuses/Orbits: The visualized paranasal sinuses and mastoid air cells are clear. No orbital abnormalities are seen. Other: None. CT CERVICAL SPINE FINDINGS Alignment: Normal. Skull base and vertebrae: No evidence of acute cervical spine fracture or traumatic subluxation. Soft tissues and spinal canal: No prevertebral fluid or swelling. No visible canal hematoma. There is synovial calcifications surrounding the odontoid process without osseous erosion or mass effect on the craniocervical junction. Disc levels: Multilevel cervical spondylosis with disc bulging, uncinate spurring and facet hypertrophy. There is mild to moderate resulting osseous foraminal narrowing which appears worst at the C4-5 and C6-7 levels. Upper chest: Emphysematous changes and mild scarring at both lung apices. Severe bilateral carotid atherosclerosis. Other: None. IMPRESSION: 1. No acute intracranial or calvarial findings. 2. Atrophy and mild chronic small vessel ischemic changes. 3. No evidence of acute cervical spine fracture, traumatic subluxation or static signs of instability. 4. Multilevel cervical spondylosis.  Electronically Signed   By: Carey Bullocks M.D.   On: 03/24/2021 15:59  DG Hip Unilat W or Wo Pelvis 2-3 Views Left  Result Date: 03/24/2021 CLINICAL DATA:  Weakness. EXAM: DG HIP (WITH OR WITHOUT PELVIS) 2-3V LEFT COMPARISON:  Abdominal radiographs 08/02/2016. Abdominopelvic CT 03/09/2013. FINDINGS: The mineralization and alignment are normal. There are advanced degenerative changes of both hips which have progressed compared with the prior studies. There are associated osteophytes, subchondral cysts and subchondral sclerosis. There is a nondisplaced fracture of the left greater trochanter. No definite extension of this fracture cross the femoral neck or into the intertrochanteric region is identified. There are diffuse vascular calcifications status post aorto bi-iliac stent grafting. Patient is status post ventral hernia repair. IMPRESSION: 1. Acute fracture of the left greater trochanter. If the patient is unable to bear weight or there is additional clinical concern for intertrochanteric extension of this fracture, consider further evaluation with CT. 2. Advanced osteoarthritic changes of both hips. Electronically Signed   By: Carey Bullocks M.D.   On: 03/24/2021 15:37    Procedures .Critical Care Performed by: Milagros Loll, MD Authorized by: Milagros Loll, MD   Critical care provider statement:    Critical care time (minutes):  39   Critical care time was exclusive of:  Separately billable procedures and treating other patients   Critical care was necessary to treat or prevent imminent or life-threatening deterioration of the following conditions:  Metabolic crisis   Critical care was time spent personally by me on the following activities:  Development of treatment plan with patient or surrogate, discussions with consultants, discussions with primary provider, evaluation of patient's response to treatment, examination of patient, obtaining history from patient or surrogate,  ordering and performing treatments and interventions, pulse oximetry, re-evaluation of patient's condition, review of old charts, ordering and review of radiographic studies and ordering and review of laboratory studies   I assumed direction of critical care for this patient from another provider in my specialty: no     Care discussed with: admitting provider     Medications Ordered in ED Medications  potassium chloride 10 mEq in 100 mL IVPB (has no administration in time range)    ED Course  I have reviewed the triage vital signs and the nursing notes.  Pertinent labs & imaging results that were available during my care of the patient were reviewed by me and considered in my medical decision making (see chart for details).    MDM Rules/Calculators/A&P                           85 year old male presents ER with concern for generalized weakness, frequent falls.  Trauma standpoint, CT of the head and neck were negative for acute pathology.  Was noted to have some tenderness over the left hip, x-ray concerning for acute fracture of the left greater trochanter.  Discussed case with orthopedics, [facility] PA, Barrie Dunker, came to bedside and evaluated patient, recommending weightbearing as tolerated, nonoperative management.  Regarding the cause for his underlying weakness.  Basic lab work was obtained and noted for profound hyponatremia as well as hypokalemia.  Discussed case with nephrology, Glenna Fellows.  Given patient's current clinical status, she recommends fluid restriction (1500 mL), q4 BMP, replacing potassium IV, admit medicine and she will see as formal consult in the morning.  Consulted hospitalist for admission.  Final Clinical Impression(s) / ED Diagnoses Final diagnoses:  Hypokalemia  Hyponatremia  Closed nondisplaced fracture of greater trochanter of left femur, initial encounter (HCC)  Rx / DC Orders ED Discharge Orders     None        Milagros Loll,  MD 03/24/21 Iona Coach, MD 03/24/21 [phone]

## 2021-03-24 NOTE — H&P (Signed)
History and Physical    Eugene [name] VOH:[id number] DOB: 11/03/1935 DOA: 03/24/2021  PCP: [name], MD Patient coming from: Home  Chief Complaint: Left hip pain  HPI: Eugene [name] is a 85 y.o. male with medical history significant of permanent A. fib on Eliquis, mild aortic stenosis/regurgitation, hypertension, hyperlipidemia, CVA/TIA, IBS, hemochromatosis followed by [name], GERD, AAA status post repair presented to the ED complaining of generalized weakness, multiple falls, and acute onset left hip pain.  Slightly tachycardic on arrival, not febrile.  Labs showing WBC 11.1, hemoglobin 13.3, platelet count 225k.  Sodium 114, potassium 2.8, chloride 78, bicarb 21, anion gap 15, BUN 15, creatinine 1.1, glucose 121.  UA without signs of infection.  Initial high-sensitivity troponin 34, repeat pending.  EKG without acute ischemic changes.  AST 65, T bili 3.3 (primarily unconjugated bilirubin).  ALT and alk phos normal.  INR 1.3.  Magnesium 1.6.  COVID and influenza PCR pending.  Chest x-ray showing no acute cardiopulmonary process. CT of head and neck negative for acute pathology. X-ray of left hip/pelvis concerning for acute fracture of the left greater trochanter.  Orthopedics PA saw the patient recommended nonoperative management - weightbearing as tolerated, crutches or walker for ambulatory assistance, and outpatient follow-up with orthopedics in 6 weeks.  Avoid hip abduction.  Labs showing severe hyponatremia (sodium 114).  ED physician discussed the case with Dr. Johnney Ou from nephrology who recommended fluid restriction (1500 mL/day) and serial BMPs every 4 hours to monitor sodium level.  Nephrology will consult in the morning.  Repeat BMP showing sodium 119, potassium 3.1, chloride 82, bicarb 22, BUN 16, creatinine 1.0, glucose 112.  Patient was given potassium supplementation.    Patient states he has been very weak and has had 5 falls in the past 2 days.  He is having pain  in his left hip and right shoulder.  He thinks he might have also hit his head on the floor during one of these falls.  Denies preceding lightheadedness/dizziness, chest pain, shortness of breath, or palpitations.  Denies loss of consciousness.  He thinks he has been falling because of generalized weakness/losing balance.  Does report very poor appetite at baseline.  He drinks boost in the morning and eats a banana and then does not eat much during the rest of the day, maybe half a sandwich.  States his wife has dementia and she is not able to cook, sometimes his children bring him food.  Denies vomiting or diarrhea.  Denies history of low sodium in the past.  He drinks 3 to 4 bottles of water every day and drinks 1 alcoholic beverage every day.  Review of Systems:  All systems reviewed and apart from history of presenting illness, are negative.  Past Medical History:  Diagnosis Date   AAA (abdominal aortic aneurysm) 06/29/2007   stent graft   Aortic stenosis 09/05/2017   Mild AS/AR by echo 01/2017.   GERD (gastroesophageal reflux disease)    Hemochromatosis    Hyperlipidemia    Hypertension    Irritable bowel syndrome    Permanent atrial fibrillation (HCC)    Stroke (HCC)    Transient Ischemic Attack    Past Surgical History:  Procedure Laterality Date   ABDOMINAL AORTIC ANEURYSM REPAIR     ENDOVASCULAR STENT INSERTION  07/18/2007   saccular infrarenal aortic aneurysm   EYE SURGERY Bilateral    Cataract     IR FLUORO GUIDE CV LINE LEFT  12/08/2018   IR FLUORO GUIDE  CV LINE RIGHT  02/13/2017   IR FLUORO GUIDE CV LINE RIGHT  02/25/2017   IR US GUIDE VASC ACCESS RIGHT  02/13/2017   PICC LINE PLACE PERIPHERAL ([place] [facility])  Oct 21, 2014     reports that he quit smoking about 21 years ago. His smoking use included cigarettes. He has never used smokeless tobacco. He reports current alcohol use of about 1.0 - 2.0 standard drink per week. He reports that he does not use drugs.  Allergies  Allergen  Reactions   Chlorhexidine Gluconate Itching    Don't use Chloraprep inside port a cath kits.   Macrolides And Ketolides Nausea And Vomiting   Tape     Adhesive tape,   Only use paper tape    Family History  Problem Relation Age of Onset   Hypertension Mother    Heart disease Mother        AAA, he passed in his 52 s of Heart Diseasse   Heart disease Father        AAA history and he passed at age 76  of   Disease    Prior to Admission medications   Medication Sig Start Date End Date Taking? Authorizing Provider  ALPRAZolam Duanne Moron) 1 MG tablet Take 0.5 mg by mouth 2 (two) times daily. For anxiety 02/01/15  Yes [provider]  amLODipine (NORVASC) 10 MG tablet TAKE 1 TABLET BY MOUTH EVERY DAY Patient taking differently: Take 10 mg by mouth daily. 06/07/20  Yes [name], Eber Hong, MD  Ascorbic Acid (VITAMIN C) 100 MG tablet Take 1,000 mg by mouth daily.   Yes [provider]  atorvastatin (LIPITOR) 20 MG tablet Take 20 mg by mouth daily.  10/23/14  Yes [provider]  Cholecalciferol (VITAMIN D3) 25 MCG (1000 UT) CAPS Take 1,000 Units by mouth daily.   Yes [provider]  ELIQUIS 5 MG TABS tablet TAKE 1 TABLET BY MOUTH TWICE A DAY Patient taking differently: Take 5 mg by mouth 2 (two) times daily. 12/22/20  Yes [name], Eber Hong, MD  fluticasone (FLONASE) 50 MCG/ACT nasal spray Place 2 sprays into both nostrils daily as needed for allergies or rhinitis.   Yes [provider]  hydrochlorothiazide (HYDRODIURIL) 25 MG tablet Take 25 mg by mouth daily.   Yes [provider]  hyoscyamine (LEVBID) 0.375 MG 12 hr tablet Take 0.375 mg by mouth daily as needed for cramping.   Yes [provider]  KLOR-CON M20 20 MEQ tablet TAKE 1 TABLET BY MOUTH DAILY. (PLEASE KEEP APPT IN OCT. WITH DR. Radford Pax BEFORE ANYMORE REFILLS.) Patient taking differently: Take 20 mEq by mouth daily. 04/18/20  Yes [name], Eber Hong, MD  Loperamide HCl (IMODIUM PO) Take 4  mg by mouth daily as needed (loose stool).   Yes [provider]  metoprolol tartrate (LOPRESSOR) 25 MG tablet Take 25 mg by mouth 2 (two) times daily.  02/01/14  Yes [provider]  omeprazole (PRILOSEC OTC) 20 MG tablet Take 20 mg by mouth daily.   Yes [provider]  ramipril (ALTACE) 5 MG capsule Take 5 mg by mouth daily.  09/23/14  Yes [provider]  Thiamine HCl (VITAMIN B-1 PO) Take 250 mg by mouth daily.   Yes [provider]  tiZANidine (ZANAFLEX) 2 MG tablet Take 2 mg by mouth at bedtime as needed for muscle spasms.   Yes [provider]  traZODone (DESYREL) 50 MG tablet Take 50 mg by mouth at bedtime as  needed for sleep.   Yes [provider]  Vitamin E 400 units TABS Take 400 Units by mouth daily.   Yes [provider]    Physical Exam: Vitals:   03/24/21 1401 03/24/21 1525 03/24/21 1745 03/24/21 1830  BP: 112/75 111/67 129/72 119/69  Pulse: (!) 107 (!) 110 100 (!) 119  Resp: '16 18 16 14  ' Temp: 98.5 F (36.9 C)     TempSrc: Oral     SpO2: 98% 98% 99% 98%    Physical Exam Constitutional:      General: He is not in acute distress. HENT:     Head: Normocephalic and atraumatic.  Eyes:     Extraocular Movements: Extraocular movements intact.     Conjunctiva/sclera: Conjunctivae normal.  Cardiovascular:     Rate and Rhythm: Normal rate and regular rhythm.     Pulses: Normal pulses.  Pulmonary:     Effort: Pulmonary effort is normal. No respiratory distress.     Breath sounds: Normal breath sounds. No wheezing or rales.  Abdominal:     General: Bowel sounds are normal. There is no distension.     Palpations: Abdomen is soft.     Tenderness: There is no abdominal tenderness.  Musculoskeletal:        General: No swelling or tenderness.     Cervical back: Normal range of motion and neck supple.     Comments: Right shoulder: No obvious swelling or deformity.  Range of motion quite limited secondary to  pain.  Skin:    General: Skin is warm and dry.  Neurological:     General: No focal deficit present.     Mental Status: He is alert and oriented to person, place, and time.     Labs on Admission: I have personally reviewed following labs and imaging studies  CBC: Recent Labs  Lab 03/24/21 1404  WBC 11.1*  HGB 13.3  HCT 35.8*  MCV 91.6  PLT 160   Basic Metabolic Panel: Recent Labs  Lab 03/24/21 1404 03/24/21 1700  NA 114* 119*  K 2.8* 3.1*  CL 78* 82*  CO2 21* 22  GLUCOSE 121* 112*  BUN 15 16  CREATININE 1.13 1.07  CALCIUM 9.0 9.3  MG  --  1.6*   GFR: CrCl cannot be calculated (Unknown ideal weight.). Liver Function Tests: Recent Labs  Lab 03/24/21 1700  AST 65*  ALT 32  ALKPHOS 52  BILITOT 3.3*  PROT 6.3*  ALBUMIN 3.7   No results for input(s): LIPASE, AMYLASE in the last 168 hours. No results for input(s): AMMONIA in the last 168 hours. Coagulation Profile: Recent Labs  Lab 03/24/21 1700  INR 1.3*   Cardiac Enzymes: No results for input(s): CKTOTAL, CKMB, CKMBINDEX, TROPONINI in the last 168 hours. BNP (last 3 results) No results for input(s): PROBNP in the last 8760 hours. HbA1C: No results for input(s): HGBA1C in the last 72 hours. CBG: Recent Labs  Lab 03/24/21 1731  GLUCAP 120*   Lipid Profile: No results for input(s): CHOL, HDL, LDLCALC, TRIG, CHOLHDL, LDLDIRECT in the last 72 hours. Thyroid Function Tests: No results for input(s): TSH, T4TOTAL, FREET4, T3FREE, THYROIDAB in the last 72 hours. Anemia Panel: No results for input(s): VITAMINB12, FOLATE, FERRITIN, TIBC, IRON, RETICCTPCT in the last 72 hours. Urine analysis:    Component Value Date/Time   COLORURINE YELLOW 03/24/2021 [place] 03/24/2021 1404   LABSPEC 1.009 03/24/2021 1404   PHURINE 7.0 03/24/2021 1404   GLUCOSEU NEGATIVE  03/24/2021 1404   HGBUR SMALL (A) 03/24/2021 1404   BILIRUBINUR NEGATIVE 03/24/2021 1404   KETONESUR 20 (A) 03/24/2021 1404    PROTEINUR 30 (A) 03/24/2021 1404   UROBILINOGEN 0.2 07/15/2007 1041   NITRITE NEGATIVE 03/24/2021 1404   LEUKOCYTESUR NEGATIVE 03/24/2021 1404    Radiological Exams on Admission: DG Chest 2 View  Result Date: 03/24/2021 CLINICAL DATA:  Weakness. EXAM: CHEST - 2 VIEW COMPARISON:  Radiographs 05/26/2019.  CT 11/06/2005. FINDINGS: The heart size and mediastinal contours are stable with aortic atherosclerosis. There is mild left basilar atelectasis. The lungs otherwise appear clear, without confluent airspace opacity, edema or pleural effusion. No acute fractures are seen. There are mild degenerative changes in the spine. IMPRESSION: No evidence of active cardiopulmonary process. Electronically Signed   By: Richardean Sale M.D.   On: 03/24/2021 15:33   CT HEAD WO CONTRAST (5MM)  Result Date: 03/24/2021 CLINICAL DATA:  Generalized weakness that started 1 week ago. Multiple falls. EXAM: CT HEAD WITHOUT CONTRAST CT CERVICAL SPINE WITHOUT CONTRAST TECHNIQUE: Multidetector CT imaging of the head and cervical spine was performed following the standard protocol without intravenous contrast. Multiplanar CT image reconstructions of the cervical spine were also generated. COMPARISON:  None. FINDINGS: CT HEAD FINDINGS Brain: There is no evidence of acute intracranial hemorrhage, mass lesion, brain edema or extra-axial fluid collection. Mild atrophy with prominence of the ventricles and subarachnoid spaces. The dilatation of the lateral and 3rd ventricles is in slight excess of the sulcal prominence, but there is no definite hydrocephalus. Mild patchy low-density in the periventricular white matter likely reflects chronic small vessel ischemic changes. There is no CT evidence of acute cortical infarction. Vascular: Intracranial vascular calcifications. No hyperdense vessel identified. Skull: Negative for fracture or focal lesion. Sinuses/Orbits: The visualized paranasal sinuses and mastoid air cells are clear. No  orbital abnormalities are seen. Other: None. CT CERVICAL SPINE FINDINGS Alignment: Normal. Skull base and vertebrae: No evidence of acute cervical spine fracture or traumatic subluxation. Soft tissues and spinal canal: No prevertebral fluid or swelling. No visible canal hematoma. There is synovial calcifications surrounding the odontoid process without osseous erosion or mass effect on the craniocervical junction. Disc levels: Multilevel cervical spondylosis with disc bulging, uncinate spurring and facet hypertrophy. There is mild to moderate resulting osseous foraminal narrowing which appears worst at the C4-5 and C6-7 levels. Upper chest: Emphysematous changes and mild scarring at both lung apices. Severe bilateral carotid atherosclerosis. Other: None. IMPRESSION: 1. No acute intracranial or calvarial findings. 2. Atrophy and mild chronic small vessel ischemic changes. 3. No evidence of acute cervical spine fracture, traumatic subluxation or static signs of instability. 4. Multilevel cervical spondylosis. Electronically Signed   By: Richardean Sale M.D.   On: 03/24/2021 15:59   CT Cervical Spine Wo Contrast  Result Date: 03/24/2021 CLINICAL DATA:  Generalized weakness that started 1 week ago. Multiple falls. EXAM: CT HEAD WITHOUT CONTRAST CT CERVICAL SPINE WITHOUT CONTRAST TECHNIQUE: Multidetector CT imaging of the head and cervical spine was performed following the standard protocol without intravenous contrast. Multiplanar CT image reconstructions of the cervical spine were also generated. COMPARISON:  None. FINDINGS: CT HEAD FINDINGS Brain: There is no evidence of acute intracranial hemorrhage, mass lesion, brain edema or extra-axial fluid collection. Mild atrophy with prominence of the ventricles and subarachnoid spaces. The dilatation of the lateral and 3rd ventricles is in slight excess of the sulcal prominence, but there is no definite hydrocephalus. Mild patchy low-density in the periventricular white  matter likely  reflects chronic small vessel ischemic changes. There is no CT evidence of acute cortical infarction. Vascular: Intracranial vascular calcifications. No hyperdense vessel identified. Skull: Negative for fracture or focal lesion. Sinuses/Orbits: The visualized paranasal sinuses and mastoid air cells are clear. No orbital abnormalities are seen. Other: None. CT CERVICAL SPINE FINDINGS Alignment: Normal. Skull base and vertebrae: No evidence of acute cervical spine fracture or traumatic subluxation. Soft tissues and spinal canal: No prevertebral fluid or swelling. No visible canal hematoma. There is synovial calcifications surrounding the odontoid process without osseous erosion or mass effect on the craniocervical junction. Disc levels: Multilevel cervical spondylosis with disc bulging, uncinate spurring and facet hypertrophy. There is mild to moderate resulting osseous foraminal narrowing which appears worst at the C4-5 and C6-7 levels. Upper chest: Emphysematous changes and mild scarring at both lung apices. Severe bilateral carotid atherosclerosis. Other: None. IMPRESSION: 1. No acute intracranial or calvarial findings. 2. Atrophy and mild chronic small vessel ischemic changes. 3. No evidence of acute cervical spine fracture, traumatic subluxation or static signs of instability. 4. Multilevel cervical spondylosis. Electronically Signed   By: Richardean Sale M.D.   On: 03/24/2021 15:59   DG Hip Unilat W or Wo Pelvis 2-3 Views Left  Result Date: 03/24/2021 CLINICAL DATA:  Weakness. EXAM: DG HIP (WITH OR WITHOUT PELVIS) 2-3V LEFT COMPARISON:  Abdominal radiographs 08/02/2016. Abdominopelvic CT 03/09/2013. FINDINGS: The mineralization and alignment are normal. There are advanced degenerative changes of both hips which have progressed compared with the prior studies. There are associated osteophytes, subchondral cysts and subchondral sclerosis. There is a nondisplaced fracture of the left greater  trochanter. No definite extension of this fracture cross the femoral neck or into the intertrochanteric region is identified. There are diffuse vascular calcifications status post aorto bi-iliac stent grafting. Patient is status post ventral hernia repair. IMPRESSION: 1. Acute fracture of the left greater trochanter. If the patient is unable to bear weight or there is additional clinical concern for intertrochanteric extension of this fracture, consider further evaluation with CT. 2. Advanced osteoarthritic changes of both hips. Electronically Signed   By: Richardean Sale M.D.   On: 03/24/2021 15:37    EKG: Independently reviewed.  A. fib with RVR, incomplete RBBB.  No significant change since prior tracing.  Assessment/Plan Principal Problem:   Hyponatremia Active Problems:   Greater trochanter fracture (HCC)   Hypokalemia   Hypomagnesemia   Hypochloremia   Severe hyponatremia Sodium 114 on initial labs, improved to 119 on repeat labs.  Sodium was 132 on labs done 02/15/2021.  Does take a thiazide diuretic at home.  He drinks 3-4 bottles of water a day but does not eat much. -Continue fluid restriction (1500 mL/day) per nephrology recommendations.  Check serum osmolarity.  Check urine sodium and osmolarity.  Check TSH level.  Serial BMPs every 4 hours to monitor sodium level.  Hold home HCTZ.  Nephrology will consult in a.m.  Acute left greater trochanter fracture Orthopedics PA saw the patient recommended nonoperative management - weightbearing as tolerated, crutches or walker for ambulatory assistance, and outpatient follow-up with orthopedics in 6 weeks.  Avoid hip abduction.  PT/OT consulted, fall precautions.  Pain management: Tramadol as needed.  Right shoulder pain -X-ray ordered to rule out fracture or dislocation.  Continue pain management.  Hypokalemia and hypomagnesemia Likely due to poor oral intake/diuretic use. -Monitor potassium and magnesium levels, replenish as  needed.  Hypochloremia Likely due to diuretic use.  Improved on repeat labs. -Hold diuretic and continue to monitor.  Mild troponin elevation High-sensitivity troponin mildly elevated but stable (34 > 28).  ACS less likely as patient is not endorsing any chest pain and EKG without acute ischemic changes.  Borderline leukocytosis Likely reactive.  Patient is not febrile.  Chest x-ray not suggestive of pneumonia and UA without signs of infection. -Continue to monitor  Permanent A. fib with RVR Rate currently 100-110 range. -Continue home metoprolol and Eliquis.  Hypertension -Hold hydrochlorothiazide.  Continue amlodipine, metoprolol, and ramipril.  Hyperlipidemia -Continue Lipitor  Hemochromatosis Likely etiology of abnormal LFTs. -Outpatient hematology follow-up  GERD -Continue omeprazole  Anxiety/mood disorder -Continue Xanax, trazodone  DVT prophylaxis: Eliquis Code Status: Patient wishes to be full code. Family Communication: No family available at this time. Disposition Plan: Status is: Inpatient  Remains inpatient appropriate because: Needs close monitoring of labs until severe hyponatremia resolves.  Level of care: Level of care: Telemetry Cardiac  The medical decision making on this patient was of high complexity and the patient is at high risk for clinical deterioration, therefore this is a level 3 visit.  Shela Leff MD [facility]  If 7PM-7AM, please contact night-coverage [email or web address]  03/24/2021, 8:33 PM

## 2021-03-24 NOTE — ED Triage Notes (Addendum)
Patient here with complaint of generalized weakness that started approximately one week ago, reports falling five times in the last two days. Patient alert, oriented, and in no apparent distress. No focal weakness. Patient complains of left hip pain after one of the recent falls. Patient takes eliquis twice per day, did not hit head on any of the falls in the last 24 hours.

## 2021-03-25 ENCOUNTER — Encounter (HOSPITAL_COMMUNITY): Payer: Self-pay | Admitting: Internal Medicine

## 2021-03-25 ENCOUNTER — Other Ambulatory Visit: Payer: Self-pay

## 2021-03-25 LAB — BASIC METABOLIC PANEL
Anion gap: 11 (ref 5–15)
Anion gap: 11 (ref 5–15)
Anion gap: 9 (ref 5–15)
Anion gap: 11 (ref 5–15)
Anion gap: 8 (ref 5–15)
BUN: 15 mg/dL (ref 8–23)
BUN: 15 mg/dL (ref 8–23)
BUN: 14 mg/dL (ref 8–23)
BUN: 14 mg/dL (ref 8–23)
BUN: 14 mg/dL (ref 8–23)
CO2: 23 mmol/L (ref 22–32)
CO2: 23 mmol/L (ref 22–32)
CO2: 25 mmol/L (ref 22–32)
CO2: 23 mmol/L (ref 22–32)
CO2: 24 mmol/L (ref 22–32)
Calcium: 8.6 mg/dL — ABNORMAL LOW (ref 8.9–10.3)
Calcium: 8.8 mg/dL — ABNORMAL LOW (ref 8.9–10.3)
Calcium: 8.7 mg/dL — ABNORMAL LOW (ref 8.9–10.3)
Calcium: 8.7 mg/dL — ABNORMAL LOW (ref 8.9–10.3)
Calcium: 8.7 mg/dL — ABNORMAL LOW (ref 8.9–10.3)
Chloride: 85 mmol/L — ABNORMAL LOW (ref 98–111)
Chloride: 87 mmol/L — ABNORMAL LOW (ref 98–111)
Chloride: 86 mmol/L — ABNORMAL LOW (ref 98–111)
Chloride: 88 mmol/L — ABNORMAL LOW (ref 98–111)
Chloride: 90 mmol/L — ABNORMAL LOW (ref 98–111)
Creatinine, Ser: 1.02 mg/dL (ref 0.61–1.24)
Creatinine, Ser: 1.06 mg/dL (ref 0.61–1.24)
Creatinine, Ser: 1.07 mg/dL (ref 0.61–1.24)
Creatinine, Ser: 1.09 mg/dL (ref 0.61–1.24)
Creatinine, Ser: 1.13 mg/dL (ref 0.61–1.24)
GFR, Estimated: >60 mL/min (ref >60)
GFR, Estimated: >60 mL/min (ref >60)
GFR, Estimated: >60 mL/min (ref >60)
GFR, Estimated: >60 mL/min (ref >60)
GFR, Estimated: >60 mL/min (ref >60)
Glucose, Bld: 101 mg/dL — ABNORMAL HIGH (ref 70–99)
Glucose, Bld: 148 mg/dL — ABNORMAL HIGH (ref 70–99)
Glucose, Bld: 112 mg/dL — ABNORMAL HIGH (ref 70–99)
Glucose, Bld: 96 mg/dL (ref 70–99)
Glucose, Bld: 133 mg/dL — ABNORMAL HIGH (ref 70–99)
Potassium: 3.1 mmol/L — ABNORMAL LOW (ref 3.5–5.1)
Potassium: 3 mmol/L — ABNORMAL LOW (ref 3.5–5.1)
Potassium: 3.2 mmol/L — ABNORMAL LOW (ref 3.5–5.1)
Potassium: 3.8 mmol/L (ref 3.5–5.1)
Potassium: 4 mmol/L (ref 3.5–5.1)
Sodium: 119 mmol/L — CL (ref 135–145)
Sodium: 121 mmol/L — ABNORMAL LOW (ref 135–145)
Sodium: 120 mmol/L — ABNORMAL LOW (ref 135–145)
Sodium: 122 mmol/L — ABNORMAL LOW (ref 135–145)
Sodium: 122 mmol/L — ABNORMAL LOW (ref 135–145)

## 2021-03-25 LAB — CBC
HCT: 29.9 % — ABNORMAL LOW (ref 39.0–52.0)
Hemoglobin: 10.9 g/dL — ABNORMAL LOW (ref 13.0–17.0)
MCH: 33.5 pg (ref 26.0–34.0)
MCHC: 36.5 g/dL — ABNORMAL HIGH (ref 30.0–36.0)
MCV: 92 fL (ref 80.0–100.0)
Platelets: 184 10*3/uL (ref 150–400)
RBC: 3.25 MIL/uL — ABNORMAL LOW (ref 4.22–5.81)
RDW: 13.2 % (ref 11.5–15.5)
WBC: 7.6 10*3/uL (ref 4.0–10.5)
nRBC: 0 % (ref 0.0–0.2)

## 2021-03-25 LAB — SODIUM, URINE, RANDOM: Sodium, Ur: 26 mmol/L

## 2021-03-25 LAB — MAGNESIUM: Magnesium: 1.9 mg/dL (ref 1.7–2.4)

## 2021-03-25 LAB — CREATININE, URINE, RANDOM: Creatinine, Urine: 66.08 mg/dL

## 2021-03-25 LAB — TSH: TSH: 1.16 u[IU]/mL (ref 0.350–4.500)

## 2021-03-25 LAB — URIC ACID: Uric Acid, Serum: 5.9 mg/dL (ref 3.7–8.6)

## 2021-03-25 MED ORDER — SODIUM CHLORIDE 0.9 % IV BOLUS
500.0000 mL | Freq: Once | INTRAVENOUS | Status: AC
  Administered 2021-03-26: 500 mL via INTRAVENOUS

## 2021-03-25 MED ORDER — MAGNESIUM SULFATE IN D5W 1-5 GM/100ML-% IV SOLN
1.0000 g | Freq: Once | INTRAVENOUS | Status: AC
  Administered 2021-03-25: 1 g via INTRAVENOUS
  Filled 2021-03-25 (×2): qty 100

## 2021-03-25 MED ORDER — POTASSIUM CHLORIDE CRYS ER 20 MEQ PO TBCR
40.0000 meq | EXTENDED_RELEASE_TABLET | Freq: Once | ORAL | Status: AC
  Administered 2021-03-25: 40 meq via ORAL
  Filled 2021-03-25: qty 2

## 2021-03-25 MED ORDER — POTASSIUM CHLORIDE 10 MEQ/100ML IV SOLN
INTRAVENOUS | Status: AC
  Filled 2021-03-25: qty 100

## 2021-03-25 MED ORDER — METOPROLOL TARTRATE 5 MG/5ML IV SOLN: 5.0000 mg | Freq: Three times a day (TID) | INTRAVENOUS | Status: DC | PRN

## 2021-03-25 MED ORDER — METOPROLOL TARTRATE 25 MG PO TABS
25.0000 mg | ORAL_TABLET | Freq: Two times a day (BID) | ORAL | Status: DC
  Administered 2021-03-26 – 2021-04-02 (×14): 25 mg via ORAL
  Filled 2021-03-25 (×16): qty 1

## 2021-03-25 MED ORDER — LACTATED RINGERS IV SOLN: INTRAVENOUS | Status: DC

## 2021-03-25 NOTE — Progress Notes (Signed)
Nephrology quick note -  noon BMP rev'd   Serum sodium 120 from 121 K 3.2  LR is off KCl 40 x2 doses already given today  Change in sodium since 2pm yesterday is which is appropriate Hold further IVF for now and cont to trent serum sodiums

## 2021-03-25 NOTE — Evaluation (Signed)
Physical Therapy Evaluation Patient Details Name: Eugene [name] MRN: [id number] DOB: 08-19-1935 Today's Date: 03/25/2021  History of Present Illness  Pt is an 85 y.o. male admitted on 03/24/21 with c/o general weakness, multiple falls, right shoulder pain, and acute left hip pain. Chest x-ray and CT of head and neck negative. Lab showed hyponatermia. X-ray of left hip showed acute fx (non-operative management, WBAT). X-ray of right shoulder showed no fx. PMH includes AAA s/p stent graft, aortic stenosis, HDL, HTN, TIA, permanent a-fib.  Clinical Impression  Pt presents with pain, weakness, decreased strength, balance, and mobility secondary to above. PTA, pt was independent in functional mobility and ADLs; of note pt reports new onset of 5-6 falls in the past week, no significant correlation per pt. Currently requires min A for transfers and ambulation. Currently cna only walk 2 ft due to pain and fatigue. Pt lives with wife who has dementia. Pt would benefit from further acute PT services while in the hospital to continue to address functional mobility goals. Discussed SNF, but pt refused and wants to return home. Recommend home health PT after discharge to help pt maximize independence.      Recommendations for follow up therapy are one component of a multi-disciplinary discharge planning process, led by the attending physician.  Recommendations may be updated based on patient status, additional functional criteria and insurance authorization.  Follow Up Recommendations Home health PT (discussed SNF, pt refused)    Assistance Recommended at Discharge Frequent or constant Supervision/Assistance  Functional Status Assessment Patient has had a recent decline in their functional status and demonstrates the ability to make significant improvements in function in a reasonable and predictable amount of time.  Equipment Recommendations  Rolling walker (2 wheels) (Pt states he would check to see if  his son could get a RW, if not he will need one)    Recommendations for Other Services       Precautions / Restrictions Precautions Precautions: Fall Restrictions Weight Bearing Restrictions: Yes LLE Weight Bearing: Weight bearing as tolerated      Mobility  Bed Mobility Overal bed mobility: Needs Assistance Bed Mobility: Supine to Sit     Supine to sit: Supervision     General bed mobility comments: Pt was able to move from supine to EOB with HOB elevated; took increased time due to pain and weakness; supervision for safety    Transfers Overall transfer level: Needs assistance Equipment used: Rolling walker (2 wheels) Transfers: Sit to/from Stand Sit to Stand: Min assist           General transfer comment: Pt stood from EOB and sat into recliner; max verbal and tactile cues to use hands to push off of bed and for upright trunk when standing; Min assist for trunk elevation and stability    Ambulation/Gait Ambulation/Gait assistance: Min assist Gait Distance (Feet): 2 Feet Assistive device: Rolling walker (2 wheels) Gait Pattern/deviations: Step-to pattern;Antalgic;Trunk flexed;Drifts right/left Gait velocity: decreased   General Gait Details: Pt walked from EOB and turned to sit into recliner using RW; gait velocity decreased due to pain and weakness; min A for stability  Stairs            Wheelchair Mobility    Modified Rankin (Stroke Patients Only)       Balance Overall balance assessment: Needs assistance Sitting-balance support: Feet supported Sitting balance-Leahy Scale: Fair Sitting balance - Comments: Pt able to sit EOB with feet supported   Standing balance support: Bilateral upper extremity supported;Reliant  on assistive device for balance Standing balance-Leahy Scale: Poor Standing balance comment: Pt used RW for static standing balance; Min A for stability while ambulating                             Pertinent Vitals/Pain  Pain Assessment: Faces Faces Pain Scale: Hurts even more Pain Location: L hip, R shoulder, back Pain Descriptors / Indicators: Tiring;Sore;Grimacing Pain Intervention(s): Limited activity within patient's tolerance;Monitored during session    Home Living Family/patient expects to be discharged to:: Private residence Living Arrangements: Spouse/significant other Available Help at Discharge: Family Type of Home: House         Home Layout: One level Home Equipment: Agricultural consultant (2 wheels);Grab bars - tub/shower;Shower seat - built in Additional Comments: Pt lives in a home made for the elderly community; pt's wife has dementia, 4 children who are supportive, plan to provide meals; has a cleaning service    Prior Function Prior Level of Function : Independent/Modified Independent             Mobility Comments: Pt ambulated with a cane. Has had 5-6 falls in the past week. ADLs Comments: Pt was independent in ADLs     Hand Dominance        Extremity/Trunk Assessment   Upper Extremity Assessment Upper Extremity Assessment: Generalized weakness    Lower Extremity Assessment Lower Extremity Assessment: Generalized weakness       Communication   Communication: No difficulties  Cognition Arousal/Alertness: Awake/alert Behavior During Therapy: Flat affect Overall Cognitive Status: No family/caregiver present to determine baseline cognitive functioning                                 General Comments: Pt was alert and oriented x4. Pt required max encouragement for therapy and repetition of the plan for therapy.        General Comments General comments (skin integrity, edema, etc.): Educated pt on importance of using RW during transfers/ambulation rather than his cane    Exercises     Assessment/Plan    PT Assessment Patient needs continued PT services  PT Problem List Decreased strength;Decreased mobility;Decreased range of motion;Decreased activity  tolerance;Pain;Decreased balance;Decreased knowledge of use of DME       PT Treatment Interventions DME instruction;Therapeutic exercise;Gait training;Balance training;Functional mobility training;Therapeutic activities;Patient/family education    PT Goals (Current goals can be found in the Care Plan section)  Acute Rehab PT Goals Patient Stated Goal: Pt wants to return home PT Goal Formulation: With patient Time For Goal Achievement: 04/08/21 Potential to Achieve Goals: Fair    Frequency Min 5X/week   Barriers to discharge        Co-evaluation               AM-PAC PT "6 Clicks" Mobility  Outcome Measure Help needed turning from your back to your side while in a flat bed without using bedrails?: A Little Help needed moving from lying on your back to sitting on the side of a flat bed without using bedrails?: A Lot Help needed moving to and from a bed to a chair (including a wheelchair)?: A Little Help needed standing up from a chair using your arms (e.g., wheelchair or bedside chair)?: A Little Help needed to walk in hospital room?: A Little Help needed climbing 3-5 steps with a railing? : A Lot 6 Click Score: 16  End of Session Equipment Utilized During Treatment: Gait belt Activity Tolerance: Patient limited by pain;Patient limited by fatigue Patient left: in chair;with call bell/phone within reach;with chair alarm set Nurse Communication: Mobility status (asked nurse about pt's Afrin) PT Visit Diagnosis: Unsteadiness on feet (R26.81);Other abnormalities of gait and mobility (R26.89);Repeated falls (R29.6);Muscle weakness (generalized) (M62.81);History of falling (Z91.81);Pain    Time: 1030-1108 PT Time Calculation (min) (ACUTE ONLY): 38 min   Charges:   PT Evaluation $PT Eval Moderate Complexity: 1 Mod PT Treatments $Therapeutic Activity: 8-22 mins        Daryel November, SPT  Daryel November 03/25/2021, 12:45 PM

## 2021-03-25 NOTE — Evaluation (Signed)
Occupational Therapy Evaluation Patient Details Name: Eugene [name] MRN: [id number] DOB: 11/03/1935 Today's Date: 03/25/2021   History of Present Illness Pt is an 85 y.o. male admitted on 03/24/21 with c/o general weakness, multiple falls, right shoulder pain, and acute left hip pain. Chest x-ray and CT of head and neck negative. Lab showed hyponatermia. X-ray of left hip showed acute fx (non-operative management, WBAT). X-ray of right shoulder showed no fx. PMH includes AAA s/p stent graft, aortic stenosis, HDL, HTN, TIA, permanent a-fib.   Clinical Impression   Patient admitted for the diagnosis above.  PTA he reports increasing falls at home, but generally was independent with ADL and household mobility.  Patient continues to drive, cares for his spouse, and completes all IADL.  He does have family in the area that assists as needed.  Deficits impacting independence are listed below.  Currently pain to his R shoulder and hip are the primary barriers.  Currently he is needing Min A for ADL completion and in room mobility at RW level.  Hip kit may be useful to demonstrate.  OT to follow in the acute setting, he intends on discharging home with [facility] as needed.       Recommendations for follow up therapy are one component of a multi-disciplinary discharge planning process, led by the attending physician.  Recommendations may be updated based on patient status, additional functional criteria and insurance authorization.   Follow Up Recommendations  No OT follow up    Assistance Recommended at Discharge Intermittent Supervision/Assistance  Functional Status Assessment  Patient has had a recent decline in their functional status and demonstrates the ability to make significant improvements in function in a reasonable and predictable amount of time.  Equipment Recommendations  None recommended by OT    Recommendations for Other Services       Precautions / Restrictions  Precautions Precautions: Fall Restrictions Weight Bearing Restrictions: Yes LLE Weight Bearing: Weight bearing as tolerated      Mobility Bed Mobility Overal bed mobility: Needs Assistance Bed Mobility: Sit to Supine     Supine to sit: Supervision Sit to supine: Min assist   General bed mobility comments: Pt was able to move from supine to EOB with HOB elevated; took increased time due to pain and weakness; supervision for safety    Transfers Overall transfer level: Needs assistance Equipment used: Rolling walker (2 wheels) Transfers: Sit to/from [facility] Sit to Stand: Min assist Stand pivot transfers: Min guard         General transfer comment: Pt stood from EOB and sat into recliner; max verbal and tactile cues to use hands to push off of bed and for upright trunk when standing; Min assist for trunk elevation and stability      Balance Overall balance assessment: Needs assistance Sitting-balance support: Feet supported Sitting balance-Leahy Scale: Fair Sitting balance - Comments: Pt able to sit EOB with feet supported   Standing balance support: Reliant on assistive device for balance;Bilateral upper extremity supported Standing balance-Leahy Scale: Poor Standing balance comment: Pt used RW for static standing balance; Min A for stability while ambulating                           ADL either performed or assessed with clinical judgement   ADL       Grooming: Wash/dry hands;Wash/dry face;Set up;Sitting   Upper Body Bathing: Minimal assistance;Sitting Upper Body Bathing Details (indicate cue type and  reason): painful shoulder Lower Body Bathing: Sit to/from stand;Minimal assistance   Upper Body Dressing : Minimal assistance;Sitting   Lower Body Dressing: Minimal assistance;Sit to/from stand   Toilet Transfer: Minimal assistance;Stand-pivot;Rolling walker (2 wheels);BSC                   Vision Baseline Vision/History:  1 Wears glasses Patient Visual Report: No change from baseline       Perception Perception Perception: Not tested   Praxis Praxis Praxis: Not tested    Pertinent Vitals/Pain Pain Assessment: Faces Faces Pain Scale: Hurts even more Pain Location: L hip, R shoulder, back Pain Descriptors / Indicators: Tiring;Sore;Grimacing Pain Intervention(s): Monitored during session     Hand Dominance Right   Extremity/Trunk Assessment Upper Extremity Assessment Upper Extremity Assessment: RUE deficits/detail RUE Deficits / Details: painful shoulder RUE: Unable to fully assess due to pain RUE Sensation: WNL RUE Coordination: WNL   Lower Extremity Assessment Lower Extremity Assessment: Defer to PT evaluation   Cervical / Trunk Assessment Cervical / Trunk Assessment: Normal   Communication Communication Communication: No difficulties   Cognition Arousal/Alertness: Awake/alert Behavior During Therapy: WFL for tasks assessed/performed Overall Cognitive Status: No family/caregiver present to determine baseline cognitive functioning                                      General Comments  VSS on RA    Exercises     Shoulder Instructions      Home Living Family/patient expects to be discharged to:: Private residence Living Arrangements: Spouse/significant other Available Help at Discharge: Family Type of Home: House       Home Layout: One level     Bathroom Shower/Tub: Walk-in shower   Bathroom Toilet: Handicapped height Bathroom Accessibility: Yes   Home Equipment: Rolling Walker (2 wheels);Grab bars - tub/shower;Shower seat - built in   Additional Comments: Pt lives in a home made for the elderly community; pt's wife has dementia, 4 children who are supportive, plan to provide meals; has a cleaning service      Prior Functioning/Environment Prior Level of Function : Independent/Modified Independent             Mobility Comments: Pt ambulated  with a cane. Has had 5-6 falls in the past week. ADLs Comments: Pt was independent in ADLs        OT Problem List: Decreased strength;Decreased range of motion;Decreased activity tolerance;Impaired balance (sitting and/or standing);Impaired UE functional use;Pain      OT Treatment/Interventions: Self-care/ADL training;Therapeutic exercise;Therapeutic activities;Balance training;DME and/or AE instruction;Patient/family education    OT Goals(Current goals can be found in the care plan section) Acute Rehab OT Goals Patient Stated Goal: Heal up and return home OT Goal Formulation: With patient Time For Goal Achievement: 04/08/21 Potential to Achieve Goals: Good ADL Goals Pt Will Perform Grooming: with supervision;standing Pt Will Perform Upper Body Bathing: with modified independence;sitting Pt Will Perform Lower Body Bathing: with supervision;sit to/from stand Pt Will Perform Upper Body Dressing: with modified independence;sitting Pt Will Perform Lower Body Dressing: with modified independence;with adaptive equipment;sit to/from stand Pt Will Transfer to Toilet: with modified independence;ambulating;regular height toilet Pt Will Perform Toileting - Clothing Manipulation and hygiene: with modified independence;sit to/from stand Pt/caregiver will Perform Home Exercise Program: Increased ROM;Right Upper extremity;Independently  OT Frequency: Min 2X/week   Barriers to D/C:    none noted       Co-evaluation                AM-PAC OT "6 Clicks" Daily Activity     Outcome Measure Help from another person eating meals?: None Help from another person taking care of personal grooming?: None Help from another person toileting, which includes using toliet, bedpan, or urinal?: A Little Help from another person bathing (including washing, rinsing, drying)?: A Little Help from another person to put on and taking off regular upper body clothing?: A Little Help from another person to put on  and taking off regular lower body clothing?: A Little 6 Click Score: 20   End of Session Equipment Utilized During Treatment: Rolling walker (2 wheels) Nurse Communication: Mobility status  Activity Tolerance: Patient tolerated treatment well Patient left: in bed;with call bell/phone within reach  OT Visit Diagnosis: Unsteadiness on feet (R26.81);Other abnormalities of gait and mobility (R26.89);Muscle weakness (generalized) (M62.81);History of falling (Z91.81);Pain Pain - Right/Left: Right Pain - part of body: Shoulder;Hip                Time: 1442-1500 OT Time Calculation (min): 18 min Charges:  OT General Charges $OT Visit: 1 Visit OT Evaluation $OT Eval Moderate Complexity: 1 Mod  03/25/2021  RP, OTR/L  Acute Rehabilitation Services  Office:  [place] 03/25/2021, 3:11 PM

## 2021-03-25 NOTE — Progress Notes (Signed)
PROGRESS NOTE                                                                                                                                                                                                             Patient Demographics:    Eugene [name], is a 85 y.o. male, DOB - [birth year], CBJ:[id number]  Outpatient Primary MD for the patient is [name], MD    LOS - 1  Admit date - 03/24/2021    Chief Complaint  Patient presents with   Weakness       Brief Narrative (HPI from H&P)    Eugene [name] is a 84 y.o. male with medical history significant of permanent A. fib on Eliquis, mild aortic stenosis/regurgitation, hypertension, hyperlipidemia, CVA/TIA, IBS, hemochromatosis followed by Dr. Darnelle Catalan, GERD, AAA status post repair presented to the ED complaining of generalized weakness, multiple falls, and acute onset left hip pain.  He presented to the ER with generalized weakness and ongoing left hip pain was diagnosed with severe dehydration, hypokalemia, hypomagnesemia, left greater trochanteric fracture, renal and orthopedics were consulted and he was admitted to the hospital.   Subjective:    Eugene [name] today has, No headache, No chest pain, No abdominal pain - No Nausea, No new weakness tingling or numbness, no shortness of breath, mild left hip pain and weakness all over.   Assessment  & Plan :     Severe Hyponatremia, Hypotension, Dehydration - multiple falls - all likely from HCTZ and PPI, BP Meds held, gentle LR @ 75 cc/hr, Na up from 114 -119 in the first 24 hrs, monitor BMP closely goal next 24 hrs to stay around 129 or below.  Discontinue PPI.  2.  Multiple falls with left greater trochanteric fracture, hip pain.  Seen by Ortho medical management.  Commence PT OT once patient is able to, may require placement to SNF.  3. HTN.  Blood pressure is low, blood pressure medications held, gentle  hydration  4.  Hypokalemia and hypomagnesemia.  Replace and monitor.  5.  Permanent A. fib with [place] vas 2 score of 3.  Continue Eliquis with caution, beta-blocker if blood pressure tolerates, may need 2 doses of digoxin in the short-term for rate control along with amiodarone if all fails.  6.  Dyslipidemia.  On statin.  7.  History of  hemochromatosis.  Outpatient follow-up.  8.  Anxiety and depression.  Trazodone.      Condition - Fair  Family Communication  :  wife Eugene [name] [phone] - on 03/25/21 @ 9.21 - message left.  Code Status :  Full  Consults  :  Renal, Ortho  PUD Prophylaxis :    Procedures  :      CT - Head and C Spine - 1. No acute intracranial or calvarial findings. 2. Atrophy and mild chronic small vessel ischemic changes. 3. No evidence of acute cervical spine fracture, traumatic subluxation or static signs of instability. 4. Multilevel cervical spondylosis.      Disposition Plan  :    Status is: Inpatient  Remains inpatient appropriate because: Severe Hyponatremia  DVT Prophylaxis  :    apixaban (ELIQUIS) tablet 5 mg    Lab Results  Component Value Date   PLT 184 03/25/2021    Diet :  Diet Order             Diet regular Room service appropriate? Yes; Fluid consistency: Thin; Fluid restriction: 1500 mL Fluid  Diet effective now                    Inpatient Medications  Scheduled Meds:  ALPRAZolam  0.5 mg Oral BID   apixaban  5 mg Oral BID   atorvastatin  20 mg Oral Daily   metoprolol tartrate  25 mg Oral BID   Continuous Infusions:  lactated ringers     potassium chloride     PRN Meds:.traMADol, traZODone  Antibiotics  :    Anti-infectives (From admission, onward)    None       Time Spent in minutes  30   Susa Raring M.D on 03/25/2021 at 9:27 AM  To page go to [email or web address]   [facility] -  Office  [phone]  See all Orders from today for further details    Objective:   Vitals:   03/25/21  0100 03/25/21 0129 03/25/21 0417 03/25/21 0909  BP: 100/63 127/80 94/62 100/69  Pulse: 76 83 73 (!) 105  Resp: 16 18 14    Temp:  98.3 F (36.8 C) 98.4 F (36.9 C) 97.8 F (36.6 C)  TempSrc:  Oral Oral Oral  SpO2: 95% 100% 99% 96%  Weight:  87.2 kg    Height:  6\' 1"  (1.854 m)      Wt Readings from Last 3 Encounters:  03/25/21 87.2 kg  02/15/21 88.2 kg  05/16/20 87.9 kg     Intake/Output Summary (Last 24 hours) at 03/25/2021 0927 Last data filed at 03/25/2021 03/27/2021 Gross per 24 hour  Intake 640 ml  Output 850 ml  Net -210 ml     Physical Exam  Awake Alert, No new F.N deficits, Normal affect [place].AT,PERRAL Supple Neck, No JVD,   Symmetrical Chest wall movement, Good air movement bilaterally, CTAB RRR,No Gallops,Rubs or new Murmurs,  +ve B.Sounds, Abd Soft, No tenderness,   No Cyanosis, Clubbing or edema,       Data Review:    CBC Recent Labs  Lab 03/24/21 1404 03/25/21 0308  WBC 11.1* 7.6  HGB 13.3 10.9*  HCT 35.8* 29.9*  PLT 225 184  MCV 91.6 92.0  MCH 34.0 33.5  MCHC 37.2* 36.5*  RDW 13.2 13.2    Recent Labs  Lab 03/24/21 1404 03/24/21 1700 03/24/21 2227 03/25/21 0308  NA 114* 119* 119* 119*  K 2.8* 3.1* 2.9* 3.1*  CL 78* 82* 83*  85*  CO2 21* GLUCOSE 121* 112* 136* 101*  BUN CREATININE 1.13 1.07 1.08 1.02  CALCIUM 9.0 9.3 8.8* 8.6*  AST  --  65*  --   --   ALT  --  32  --   --   ALKPHOS  --  52  --   --   BILITOT  --  3.3*  --   --   ALBUMIN  --  3.7  --   --   MG  --  1.6*  --  1.9  INR  --  1.3*  --   --   TSH  --   --   --  1.160    ------------------------------------------------------------------------------------------------------------------ No results for input(s): CHOL, HDL, LDLCALC, TRIG, CHOLHDL, LDLDIRECT in the last 72 hours.  No results found for: HGBA1C ------------------------------------------------------------------------------------------------------------------ Recent Labs    03/25/21 0308   TSH 1.160    Cardiac Enzymes No results for input(s): CKMB, TROPONINI, MYOGLOBIN in the last 168 hours.  Invalid input(s): CK ------------------------------------------------------------------------------------------------------------------ No results found for: BNP   Radiology Reports DG Chest 2 View  Result Date: 03/24/2021 CLINICAL DATA:  Weakness. EXAM: CHEST - 2 VIEW COMPARISON:  Radiographs 05/26/2019.  CT 11/06/2005. FINDINGS: The heart size and mediastinal contours are stable with aortic atherosclerosis. There is mild left basilar atelectasis. The lungs otherwise appear clear, without confluent airspace opacity, edema or pleural effusion. No acute fractures are seen. There are mild degenerative changes in the spine. IMPRESSION: No evidence of active cardiopulmonary process. Electronically Signed   By: Carey Bullocks M.D.   On: 03/24/2021 15:33   DG Shoulder Right  Result Date: 03/24/2021 CLINICAL DATA:  Right shoulder pain after fall. EXAM: RIGHT SHOULDER - 2+ VIEW COMPARISON:  Radiograph 04/05/2020 FINDINGS: There is no evidence of fracture or dislocation. Moderate acromioclavicular degenerative change. Small subacromial spur. There is soft tissue calcifications adjacent to the lateral humeral head, not seen on prior exam. No erosion or bone destruction. IMPRESSION: 1. No fracture or dislocation of the right shoulder. 2. Soft tissue calcifications adjacent to the lateral humeral head, new from prior exam, may represent calcific tendinopathy or bursitis. Electronically Signed   By: Narda Rutherford M.D.   On: 03/24/2021 21:50   CT HEAD WO CONTRAST ( )  Result Date: 03/24/2021 CLINICAL DATA:  Generalized weakness that started 1 week ago. Multiple falls. EXAM: CT HEAD WITHOUT CONTRAST CT CERVICAL SPINE WITHOUT CONTRAST TECHNIQUE: Multidetector CT imaging of the head and cervical spine was performed following the standard protocol without intravenous contrast. Multiplanar CT image  reconstructions of the cervical spine were also generated. COMPARISON:  None. FINDINGS: CT HEAD FINDINGS Brain: There is no evidence of acute intracranial hemorrhage, mass lesion, brain edema or extra-axial fluid collection. Mild atrophy with prominence of the ventricles and subarachnoid spaces. The dilatation of the lateral and 3rd ventricles is in slight excess of the sulcal prominence, but there is no definite hydrocephalus. Mild patchy low-density in the periventricular white matter likely reflects chronic small vessel ischemic changes. There is no CT evidence of acute cortical infarction. Vascular: Intracranial vascular calcifications. No hyperdense vessel identified. Skull: Negative for fracture or focal lesion. Sinuses/Orbits: The visualized paranasal sinuses and mastoid air cells are clear. No orbital abnormalities are seen. Other: None. CT CERVICAL SPINE FINDINGS Alignment: Normal. Skull base and vertebrae: No evidence of acute cervical spine fracture or traumatic subluxation. Soft tissues and spinal canal: No prevertebral fluid or swelling. No visible canal  hematoma. There is synovial calcifications surrounding the odontoid process without osseous erosion or mass effect on the craniocervical junction. Disc levels: Multilevel cervical spondylosis with disc bulging, uncinate spurring and facet hypertrophy. There is mild to moderate resulting osseous foraminal narrowing which appears worst at the C4-5 and C6-7 levels. Upper chest: Emphysematous changes and mild scarring at both lung apices. Severe bilateral carotid atherosclerosis. Other: None. IMPRESSION: 1. No acute intracranial or calvarial findings. 2. Atrophy and mild chronic small vessel ischemic changes. 3. No evidence of acute cervical spine fracture, traumatic subluxation or static signs of instability. 4. Multilevel cervical spondylosis. Electronically Signed   By: Carey Bullocks M.D.   On: 03/24/2021 15:59   CT Cervical Spine Wo  Contrast  Result Date: 03/24/2021 CLINICAL DATA:  Generalized weakness that started 1 week ago. Multiple falls. EXAM: CT HEAD WITHOUT CONTRAST CT CERVICAL SPINE WITHOUT CONTRAST TECHNIQUE: Multidetector CT imaging of the head and cervical spine was performed following the standard protocol without intravenous contrast. Multiplanar CT image reconstructions of the cervical spine were also generated. COMPARISON:  None. FINDINGS: CT HEAD FINDINGS Brain: There is no evidence of acute intracranial hemorrhage, mass lesion, brain edema or extra-axial fluid collection. Mild atrophy with prominence of the ventricles and subarachnoid spaces. The dilatation of the lateral and 3rd ventricles is in slight excess of the sulcal prominence, but there is no definite hydrocephalus. Mild patchy low-density in the periventricular white matter likely reflects chronic small vessel ischemic changes. There is no CT evidence of acute cortical infarction. Vascular: Intracranial vascular calcifications. No hyperdense vessel identified. Skull: Negative for fracture or focal lesion. Sinuses/Orbits: The visualized paranasal sinuses and mastoid air cells are clear. No orbital abnormalities are seen. Other: None. CT CERVICAL SPINE FINDINGS Alignment: Normal. Skull base and vertebrae: No evidence of acute cervical spine fracture or traumatic subluxation. Soft tissues and spinal canal: No prevertebral fluid or swelling. No visible canal hematoma. There is synovial calcifications surrounding the odontoid process without osseous erosion or mass effect on the craniocervical junction. Disc levels: Multilevel cervical spondylosis with disc bulging, uncinate spurring and facet hypertrophy. There is mild to moderate resulting osseous foraminal narrowing which appears worst at the C4-5 and C6-7 levels. Upper chest: Emphysematous changes and mild scarring at both lung apices. Severe bilateral carotid atherosclerosis. Other: None. IMPRESSION: 1. No acute  intracranial or calvarial findings. 2. Atrophy and mild chronic small vessel ischemic changes. 3. No evidence of acute cervical spine fracture, traumatic subluxation or static signs of instability. 4. Multilevel cervical spondylosis. Electronically Signed   By: Carey Bullocks M.D.   On: 03/24/2021 15:59   DG Hip Unilat W or Wo Pelvis 2-3 Views Left  Result Date: 03/24/2021 CLINICAL DATA:  Weakness. EXAM: DG HIP (WITH OR WITHOUT PELVIS) 2-3V LEFT COMPARISON:  Abdominal radiographs 08/02/2016. Abdominopelvic CT 03/09/2013. FINDINGS: The mineralization and alignment are normal. There are advanced degenerative changes of both hips which have progressed compared with the prior studies. There are associated osteophytes, subchondral cysts and subchondral sclerosis. There is a nondisplaced fracture of the left greater trochanter. No definite extension of this fracture cross the femoral neck or into the intertrochanteric region is identified. There are diffuse vascular calcifications status post aorto bi-iliac stent grafting. Patient is status post ventral hernia repair. IMPRESSION: 1. Acute fracture of the left greater trochanter. If the patient is unable to bear weight or there is additional clinical concern for intertrochanteric extension of this fracture, consider further evaluation with CT. 2. Advanced osteoarthritic changes of both hips.  Electronically Signed   By: Carey Bullocks M.D.   On: 03/24/2021 15:37

## 2021-03-25 NOTE — Consult Note (Signed)
[facility]  INPATIENT CONSULTATION  Reason for Consultation: Hyponatremia Requesting Provider: Dr. Thedore Mins  HPI: Eugene [name] is an 85 y.o. male with AAA s/p graft 2009, HL, HTN, IBS, A fib, h/o TIA, mild AS, hemochromatosis who is admitted after presenting with weakness and nephrology is consulted for evaluation and management of hyponatremia.    Came in yesterday via ED after a fall and weakness.  Labs showing Na 114, K 2.8, BUN 15, Cr 1.1, Ca 9.  Hb normal, COVID neg.  CXR and CT head ok.  L hip nondisplaced fracture, nonoperative mgmt per ortho c/s. ED called me and I rec KCl IV, fluid restriction and normal diet and following close labs given high risk for overcorrection.  Subsequent labs showed Na 119, K 3.1 yesterday 5pm and same at 11p and 3am.  10pm urine osm 316 and urine sodium this AM 26.  He cares for elderly wife with dementia.  Has poor appetite - boost in AM and banana and sometimes that's it.  Drinks water through the day. His BP has ranged 90-60s - 120/80s. I/Os 640 / 850.    He notes being weak and sore but has no other complaints this AM.   PMH: Past Medical History:  Diagnosis Date   AAA (abdominal aortic aneurysm) 06/29/2007   stent graft   Aortic stenosis 09/05/2017   Mild AS/AR by echo 01/2017.   GERD (gastroesophageal reflux disease)    Hemochromatosis    Hyperlipidemia    Hypertension    Irritable bowel syndrome    Permanent atrial fibrillation (HCC)    Stroke (HCC)    Transient Ischemic Attack   PSH: Past Surgical History:  Procedure Laterality Date   ABDOMINAL AORTIC ANEURYSM REPAIR     ENDOVASCULAR STENT INSERTION  07/18/2007   saccular infrarenal aortic aneurysm   EYE SURGERY Bilateral    Cataract     IR FLUORO GUIDE CV LINE LEFT  12/08/2018   IR FLUORO GUIDE CV LINE RIGHT  02/13/2017   IR FLUORO GUIDE CV LINE RIGHT  02/25/2017   IR US GUIDE VASC ACCESS RIGHT  02/13/2017   PICC LINE PLACE PERIPHERAL ([facility] HX)  Oct 21, 2014    Past Medical History:  Diagnosis Date   AAA (abdominal aortic aneurysm) 06/29/2007   stent graft   Aortic stenosis 09/05/2017   Mild AS/AR by echo 01/2017.   GERD (gastroesophageal reflux disease)    Hemochromatosis    Hyperlipidemia    Hypertension    Irritable bowel syndrome    Permanent atrial fibrillation (HCC)    Stroke (HCC)    Transient Ischemic Attack    Medications:  I have reviewed the patient's current medications. LR 75/hr this AM Eliquis 5 BID PRN xanax Metoprolol 25 BID KCL supplement Tramadol, trazodone PRNs  Medications Prior to Admission  Medication Sig Dispense Refill   ALPRAZolam (XANAX) 1 MG tablet Take 0.5 mg by mouth 2 (two) times daily. For anxiety     amLODipine (NORVASC) 10 MG tablet TAKE 1 TABLET BY MOUTH EVERY DAY (Patient taking differently: Take 10 mg by mouth daily.) 90 tablet 3   Ascorbic Acid (VITAMIN C) 100 MG tablet Take 1,000 mg by mouth daily.     atorvastatin (LIPITOR) 20 MG tablet Take 20 mg by mouth daily.      Cholecalciferol (VITAMIN D3) 25 MCG (1000 UT) CAPS Take 1,000 Units by mouth daily.     ELIQUIS 5 MG TABS tablet TAKE 1 TABLET BY MOUTH TWICE  A DAY (Patient taking differently: Take 5 mg by mouth 2 (two) times daily.) 60 tablet 5   fluticasone (FLONASE) 50 MCG/ACT nasal spray Place 2 sprays into both nostrils daily as needed for allergies or rhinitis.     hydrochlorothiazide (HYDRODIURIL) 25 MG tablet Take 25 mg by mouth daily.     hyoscyamine (LEVBID) 0.375 MG 12 hr tablet Take 0.375 mg by mouth daily as needed for cramping.     KLOR-CON M20 20 MEQ tablet TAKE 1 TABLET BY MOUTH DAILY. (PLEASE KEEP APPT IN OCT. WITH DR. Mayford Knife BEFORE ANYMORE REFILLS.) (Patient taking differently: Take 20 mEq by mouth daily.) 90 tablet 2   Loperamide HCl (IMODIUM PO) Take 4 mg by mouth daily as needed (loose stool).     metoprolol tartrate (LOPRESSOR) 25 MG tablet Take 25 mg by mouth 2 (two) times daily.      omeprazole (PRILOSEC OTC) 20 MG tablet  Take 20 mg by mouth daily.     ramipril (ALTACE) 5 MG capsule Take 5 mg by mouth daily.      Thiamine HCl (VITAMIN B-1 PO) Take 250 mg by mouth daily.     tiZANidine (ZANAFLEX) 2 MG tablet Take 2 mg by mouth at bedtime as needed for muscle spasms.     traZODone (DESYREL) 50 MG tablet Take 50 mg by mouth at bedtime as needed for sleep.     Vitamin E 400 units TABS Take 400 Units by mouth daily.      ALLERGIES:   Allergies  Allergen Reactions   Chlorhexidine Gluconate Itching    Don't use Chloraprep inside port a cath kits.   Macrolides And Ketolides Nausea And Vomiting   Tape     Adhesive tape,   Only use paper tape    FAM HX: Family History  Problem Relation Age of Onset   Hypertension Mother    Heart disease Mother        AAA, he passed in his 64 s of Heart Diseasse   Heart disease Father        AAA history and he passed at age 24  of   Disease    Social History:   reports that he quit smoking about 21 years ago. His smoking use included cigarettes. He has never used smokeless tobacco. He reports current alcohol use of about 1.0 - 2.0 standard drink per week. He reports that he does not use drugs.  ROS: 12 system ROS neg except per HPI   Blood pressure 100/69, pulse (!) 105, temperature 97.8 F (36.6 C), temperature source Oral, resp. rate 14, height  (1.854 m), weight 87.2 kg, SpO2 96 %. PHYSICAL EXAM: Gen: elderly man sitting in bedside chair comfortably at rest  Eyes:  anicteric, EOMI, glasses ENT: MMM Neck: supple, no JVD CV:  HR in low 100s Abd:  soft, nontender Back: clear GU: no foley Extr:  no edema Neuro: grossly nonfocal and sharp with details   Results for orders placed or performed during the hospital encounter of 03/24/21 (from the past 48 hour(s))  Basic metabolic panel     Status: Abnormal   Collection Time: 03/24/21  2:04 PM  Result Value Ref Range   Sodium 114 (LL) 135 - 145 mmol/L    Comment: CRITICAL RESULT CALLED TO, READ BACK BY AND  VERIFIED WITH: C ROWE RN [id number] BY R VERAAR    Potassium 2.8 (L) 3.5 - 5.1 mmol/L   Chloride 78 (L) 98 - 111 mmol/L  CO2 21 (L) 22 - 32 mmol/L   Glucose, Bld 121 (H) 70 - 99 mg/dL    Comment: Glucose reference range applies only to samples taken after fasting for at least 8 hours.   BUN 15 8 - 23 mg/dL   Creatinine, Ser 0.45 0.61 - 1.24 mg/dL   Calcium 9.0 8.9 - 40.9 mg/dL   GFR, Estimated >81 >19 mL/min    Comment: (NOTE) Calculated using the CKD-EPI Creatinine Equation (2021)    Anion gap 15 5 - 15    Comment: Performed at [facility], [address], [place], [place] [address]  CBC     Status: Abnormal   Collection Time: 03/24/21  2:04 PM  Result Value Ref Range   WBC 11.1 (H) 4.0 - 10.5 K/uL   RBC 3.91 (L) 4.22 - 5.81 MIL/uL   Hemoglobin 13.3 13.0 - 17.0 g/dL    Comment: REPEATED TO VERIFY   HCT 35.8 (L) 39.0 - 52.0 %   MCV 91.6 80.0 - 100.0 fL   MCH 34.0 26.0 - 34.0 pg   MCHC 37.2 (H) 30.0 - 36.0 g/dL    Comment: REPEATED TO VERIFY   RDW 13.2 11.5 - 15.5 %   Platelets 225 150 - 400 K/uL   nRBC 0.0 0.0 - 0.2 %    Comment: Performed at [facility], [address] [place], [place] [address]  Urinalysis, Routine w reflex microscopic Urine, Clean Catch     Status: Abnormal   Collection Time: 03/24/21  2:04 PM  Result Value Ref Range   Color, Urine YELLOW YELLOW   APPearance CLEAR CLEAR   Specific Gravity, Urine 1.009 1.005 - 1.030   pH 7.0 5.0 - 8.0   Glucose, UA NEGATIVE NEGATIVE mg/dL   Hgb urine dipstick SMALL (A) NEGATIVE   Bilirubin Urine NEGATIVE NEGATIVE   Ketones, ur 20 (A) NEGATIVE mg/dL   Protein, ur 30 (A) NEGATIVE mg/dL   Nitrite NEGATIVE NEGATIVE   Leukocytes,Ua NEGATIVE NEGATIVE   RBC / HPF 6-10 0 - 5 RBC/hpf   WBC, UA 0-5 0 - 5 WBC/hpf   Bacteria, UA NONE SEEN NONE SEEN    Comment: Performed at [facility], [address] [place], [place] [address]  Troponin I (High Sensitivity)     Status: Abnormal   Collection Time:  03/24/21  5:00 PM  Result Value Ref Range   Troponin I (High Sensitivity) 34 (H) <18 ng/L    Comment: (NOTE) Elevated high sensitivity troponin I (hsTnI) values and significant  changes across serial measurements may suggest ACS but many other  chronic and acute conditions are known to elevate hsTnI results.  Refer to the Links section for chest pain algorithms and additional  guidance. Performed at [facility], [address], [place], [place] [address]   Hepatic function panel     Status: Abnormal   Collection Time: 03/24/21  5:00 PM  Result Value Ref Range   Total Protein 6.3 (L) 6.5 - 8.1 g/dL   Albumin 3.7 3.5 - 5.0 g/dL   AST 65 (H) 15 - 41 U/L   ALT 32 0 - 44 U/L   Alkaline Phosphatase 52 38 - 126 U/L   Total Bilirubin 3.3 (H) 0.3 - 1.2 mg/dL   Bilirubin, Direct 0.6 (H) 0.0 - 0.2 mg/dL   Indirect Bilirubin 2.7 (H) 0.3 - 0.9 mg/dL    Comment: Performed at [facility], [address], [place], [place]  [address]  Protime-INR     Status: Abnormal   Collection Time: 03/24/21  5:00 PM  Result Value Ref Range   Prothrombin Time 15.8 (H) 11.4 - 15.2 seconds   INR 1.3 (H) 0.8 - 1.2    Comment: (NOTE) INR goal varies based on device and disease states. Performed at [facility], [address], [place], [place] [address]   Basic metabolic panel     Status: Abnormal   Collection Time: 03/24/21  5:00 PM  Result Value Ref Range   Sodium 119 (LL) 135 - 145 mmol/L    Comment: CRITICAL RESULT CALLED TO, READ BACK BY AND VERIFIED WITH: C.YANG,RN @1853  03/24/2021 [email or web address]    Potassium 3.1 (L) 3.5 - 5.1 mmol/L   Chloride 82 (L) 98 - 111 mmol/L   CO2 22 22 - 32 mmol/L   Glucose, Bld 112 (H) 70 - 99 mg/dL    Comment: Glucose reference range applies only to samples taken after fasting for at least 8 hours.   BUN 16 8 - 23 mg/dL   Creatinine, Ser 2.70 0.61 - 1.24 mg/dL   Calcium 9.3 8.9 - 78.6 mg/dL   GFR, Estimated >75 >44 mL/min    Comment: (NOTE) Calculated  using the CKD-EPI Creatinine Equation (2021)    Anion gap 15 5 - 15    Comment: Performed at [facility], [address] [place], [place] [address]  Magnesium     Status: Abnormal   Collection Time: 03/24/21  5:00 PM  Result Value Ref Range   Magnesium 1.6 (L) 1.7 - 2.4 mg/dL    Comment: Performed at [facility], [address], [place], [place] [address]  CBG monitoring, ED     Status: Abnormal   Collection Time: 03/24/21  5:31 PM  Result Value Ref Range   Glucose-Capillary 120 (H) 70 - 99 mg/dL    Comment: Glucose reference range applies only to samples taken after fasting for at least 8 hours.  Troponin I (High Sensitivity)     Status: Abnormal   Collection Time: 03/24/21  7:00 PM  Result Value Ref Range   Troponin I (High Sensitivity) 28 (H) <18 ng/L    Comment: (NOTE) Elevated high sensitivity troponin I (hsTnI) values and significant  changes across serial measurements may suggest ACS but many other  chronic and acute conditions are known to elevate hsTnI results.  Refer to the "Links" section for chest pain algorithms and additional  guidance. Performed at [facility], [address] [place], [place] [address]   Resp Panel by RT-PCR (Flu A&B, Covid) Nasopharyngeal Swab     Status: None   Collection Time: 03/24/21  7:38 PM   Specimen: Nasopharyngeal Swab; Nasopharyngeal(NP) swabs in vial transport medium  Result Value Ref Range   SARS Coronavirus 2 by RT PCR NEGATIVE NEGATIVE    Comment: (NOTE) SARS-CoV-2 target nucleic acids are NOT DETECTED.  The SARS-CoV-2 RNA is generally detectable in upper respiratory specimens during the acute phase of infection. The lowest concentration of SARS-CoV-2 viral copies this assay can detect is 138 copies/mL. A negative result does not preclude SARS-Cov-2 infection and should not be used as the sole basis for treatment or other patient management decisions. A negative result may occur with  improper specimen  collection/handling, submission of specimen other than nasopharyngeal swab, presence of viral mutation(s) within the areas targeted by this assay, and inadequate number of viral copies(<138 copies/mL). A negative result must be combined  with clinical observations, patient history, and epidemiological information. The expected result is Negative.  Fact Sheet for Patients:  BloggerCourse.com  Fact Sheet for Healthcare Providers:  SeriousBroker.it  This test is no t yet approved or cleared by the [place] FDA and  has been authorized for detection and/or diagnosis of SARS-CoV-2 by FDA under an Emergency Use Authorization (EUA). This EUA will remain  in effect (meaning this test can be used) for the duration of the COVID-19 declaration under Section 564(b)(1) of the Act, 21 U.S.C.section 360bbb-3(b)(1), unless the authorization is terminated  or revoked sooner.       Influenza A by PCR NEGATIVE NEGATIVE   Influenza B by PCR NEGATIVE NEGATIVE    Comment: (NOTE) The Xpert Xpress SARS-CoV-2/FLU/RSV plus assay is intended as an aid in the diagnosis of influenza from Nasopharyngeal swab specimens and should not be used as a sole basis for treatment. Nasal washings and aspirates are unacceptable for Xpert Xpress SARS-CoV-2/FLU/RSV testing.  Fact Sheet for Patients: BloggerCourse.com  Fact Sheet for Healthcare Providers: SeriousBroker.it  This test is not yet approved or cleared by the [place] FDA and has been authorized for detection and/or diagnosis of SARS-CoV-2 by FDA under an Emergency Use Authorization (EUA). This EUA will remain in effect (meaning this test can be used) for the duration of the COVID-19 declaration under Section 564(b)(1) of the Act, 21 U.S.C. section 360bbb-3(b)(1), unless the authorization is terminated or revoked.  Performed at [facility]  Lab, [address] [place], [place] [address]   Osmolality, urine     Status: None   Collection Time: 03/24/21 10:21 PM  Result Value Ref Range   Osmolality, Ur 316 300 - 900 mOsm/kg    Comment: Performed at [facility], [address] [place], [place] [address]  Basic metabolic panel     Status: Abnormal   Collection Time: 03/24/21 10:27 PM  Result Value Ref Range   Sodium 119 (LL) 135 - 145 mmol/L    Comment: CRITICAL RESULT CALLED TO, READ BACK BY AND VERIFIED WITH: Ivery Quale RN 03/24/21 2328 [place] [address]    Potassium 2.9 (L) 3.5 - 5.1 mmol/L   Chloride 83 (L) 98 - 111 mmol/L   CO2 23 22 - 32 mmol/L   Glucose, Bld 136 (H) 70 - 99 mg/dL    Comment: Glucose reference range applies only to samples taken after fasting for at least 8 hours.   BUN 16 8 - 23 mg/dL   Creatinine, Ser 0.98 0.61 - 1.24 mg/dL   Calcium 8.8 (L) 8.9 - 10.3 mg/dL   GFR, Estimated >11 >91 mL/min    Comment: (NOTE) Calculated using the CKD-EPI Creatinine Equation (2021)    Anion gap 13 5 - 15    Comment: Performed at [facility], [address] [place], [place] [address]  Osmolality     Status: Abnormal   Collection Time: 03/24/21 10:27 PM  Result Value Ref Range   Osmolality 253 (L) 275 - 295 mOsm/kg    Comment: Performed at [facility], [address] [place], [place] [address]  Basic metabolic panel     Status: Abnormal   Collection Time: 03/25/21  3:08 AM  Result Value Ref Range   Sodium 119 (LL) 135 - 145 mmol/L    Comment: CRITICAL RESULT CALLED TO, READ BACK BY AND VERIFIED WITH: A WAX RN 03/25/21 0452 M KOROLESKI    Potassium 3.1 (L) 3.5 - 5.1 mmol/L  Chloride 85 (L) 98 - 111 mmol/L   CO2 23 22 - 32 mmol/L   Glucose, Bld 101 (H) 70 - 99 mg/dL    Comment: Glucose reference range applies only to samples taken after fasting for at least 8 hours.   BUN 15 8 - 23 mg/dL   Creatinine, Ser 0.45 0.61 - 1.24 mg/dL   Calcium 8.6 (L) 8.9 - 10.3 mg/dL   GFR, Estimated >40 >98  mL/min    Comment: (NOTE) Calculated using the CKD-EPI Creatinine Equation (2021)    Anion gap 11 5 - 15    Comment: Performed at [facility], [address] [place], [place] [address]  TSH     Status: None   Collection Time: 03/25/21  3:08 AM  Result Value Ref Range   TSH 1.160 0.350 - 4.500 uIU/mL    Comment: Performed by a 3rd Generation assay with a functional sensitivity of <=0.01 uIU/mL. Performed at [facility], [address] [place], [place] [address]   CBC     Status: Abnormal   Collection Time: 03/25/21  3:08 AM  Result Value Ref Range   WBC 7.6 4.0 - 10.5 K/uL   RBC 3.25 (L) 4.22 - 5.81 MIL/uL   Hemoglobin 10.9 (L) 13.0 - 17.0 g/dL    Comment: REPEATED TO VERIFY   HCT 29.9 (L) 39.0 - 52.0 %   MCV 92.0 80.0 - 100.0 fL   MCH 33.5 26.0 - 34.0 pg   MCHC 36.5 (H) 30.0 - 36.0 g/dL    Comment: REPEATED TO VERIFY   RDW 13.2 11.5 - 15.5 %   Platelets 184 150 - 400 K/uL   nRBC 0.0 0.0 - 0.2 %    Comment: Performed at [facility], [address], [place], [place] [address]  Magnesium     Status: None   Collection Time: 03/25/21  3:08 AM  Result Value Ref Range   Magnesium 1.9 1.7 - 2.4 mg/dL    Comment: Performed at [facility], [address], [place], [place] [address]  Sodium, urine, random     Status: None   Collection Time: 03/25/21  6:26 AM  Result Value Ref Range   Sodium, Ur 26 mmol/L    Comment: Performed at [facility], [address], [place], [place] [address]    DG Chest 2 View  Result Date: 03/24/2021 CLINICAL DATA:  Weakness. EXAM: CHEST - 2 VIEW COMPARISON:  Radiographs 05/26/2019.  CT 11/06/2005. FINDINGS: The heart size and mediastinal contours are stable with aortic atherosclerosis. There is mild left basilar atelectasis. The lungs otherwise appear clear, without confluent airspace opacity, edema or pleural effusion. No acute fractures are seen. There are mild degenerative changes in the spine. IMPRESSION: No  evidence of active cardiopulmonary process. Electronically Signed   By: Carey Bullocks M.D.   On: 03/24/2021 15:33   DG Shoulder Right  Result Date: 03/24/2021 CLINICAL DATA:  Right shoulder pain after fall. EXAM: RIGHT SHOULDER - 2+ VIEW COMPARISON:  Radiograph 04/05/2020 FINDINGS: There is no evidence of fracture or dislocation. Moderate acromioclavicular degenerative change. Small subacromial spur. There is soft tissue calcifications adjacent to the lateral humeral head, not seen on prior exam. No erosion or bone destruction. IMPRESSION: 1. No fracture or dislocation of the right shoulder. 2. Soft tissue calcifications adjacent to the lateral humeral head, new from prior exam, may represent calcific tendinopathy or bursitis. Electronically Signed   By: Ivette Loyal.D.  On: 03/24/2021 21:50   CT HEAD WO CONTRAST ( )  Result Date: 03/24/2021 CLINICAL DATA:  Generalized weakness that started 1 week ago. Multiple falls. EXAM: CT HEAD WITHOUT CONTRAST CT CERVICAL SPINE WITHOUT CONTRAST TECHNIQUE: Multidetector CT imaging of the head and cervical spine was performed following the standard protocol without intravenous contrast. Multiplanar CT image reconstructions of the cervical spine were also generated. COMPARISON:  None. FINDINGS: CT HEAD FINDINGS Brain: There is no evidence of acute intracranial hemorrhage, mass lesion, brain edema or extra-axial fluid collection. Mild atrophy with prominence of the ventricles and subarachnoid spaces. The dilatation of the lateral and 3rd ventricles is in slight excess of the sulcal prominence, but there is no definite hydrocephalus. Mild patchy low-density in the periventricular white matter likely reflects chronic small vessel ischemic changes. There is no CT evidence of acute cortical infarction. Vascular: Intracranial vascular calcifications. No hyperdense vessel identified. Skull: Negative for fracture or focal lesion. Sinuses/Orbits: The visualized  paranasal sinuses and mastoid air cells are clear. No orbital abnormalities are seen. Other: None. CT CERVICAL SPINE FINDINGS Alignment: Normal. Skull base and vertebrae: No evidence of acute cervical spine fracture or traumatic subluxation. Soft tissues and spinal canal: No prevertebral fluid or swelling. No visible canal hematoma. There is synovial calcifications surrounding the odontoid process without osseous erosion or mass effect on the craniocervical junction. Disc levels: Multilevel cervical spondylosis with disc bulging, uncinate spurring and facet hypertrophy. There is mild to moderate resulting osseous foraminal narrowing which appears worst at the C4-5 and C6-7 levels. Upper chest: Emphysematous changes and mild scarring at both lung apices. Severe bilateral carotid atherosclerosis. Other: None. IMPRESSION: 1. No acute intracranial or calvarial findings. 2. Atrophy and mild chronic small vessel ischemic changes. 3. No evidence of acute cervical spine fracture, traumatic subluxation or static signs of instability. 4. Multilevel cervical spondylosis. Electronically Signed   By: Carey Bullocks M.D.   On: 03/24/2021 15:59   CT Cervical Spine Wo Contrast  Result Date: 03/24/2021 CLINICAL DATA:  Generalized weakness that started 1 week ago. Multiple falls. EXAM: CT HEAD WITHOUT CONTRAST CT CERVICAL SPINE WITHOUT CONTRAST TECHNIQUE: Multidetector CT imaging of the head and cervical spine was performed following the standard protocol without intravenous contrast. Multiplanar CT image reconstructions of the cervical spine were also generated. COMPARISON:  None. FINDINGS: CT HEAD FINDINGS Brain: There is no evidence of acute intracranial hemorrhage, mass lesion, brain edema or extra-axial fluid collection. Mild atrophy with prominence of the ventricles and subarachnoid spaces. The dilatation of the lateral and 3rd ventricles is in slight excess of the sulcal prominence, but there is no definite  hydrocephalus. Mild patchy low-density in the periventricular white matter likely reflects chronic small vessel ischemic changes. There is no CT evidence of acute cortical infarction. Vascular: Intracranial vascular calcifications. No hyperdense vessel identified. Skull: Negative for fracture or focal lesion. Sinuses/Orbits: The visualized paranasal sinuses and mastoid air cells are clear. No orbital abnormalities are seen. Other: None. CT CERVICAL SPINE FINDINGS Alignment: Normal. Skull base and vertebrae: No evidence of acute cervical spine fracture or traumatic subluxation. Soft tissues and spinal canal: No prevertebral fluid or swelling. No visible canal hematoma. There is synovial calcifications surrounding the odontoid process without osseous erosion or mass effect on the craniocervical junction. Disc levels: Multilevel cervical spondylosis with disc bulging, uncinate spurring and facet hypertrophy. There is mild to moderate resulting osseous foraminal narrowing which appears worst at the C4-5 and C6-7 levels. Upper chest: Emphysematous changes and mild scarring at both lung  apices. Severe bilateral carotid atherosclerosis. Other: None. IMPRESSION: 1. No acute intracranial or calvarial findings. 2. Atrophy and mild chronic small vessel ischemic changes. 3. No evidence of acute cervical spine fracture, traumatic subluxation or static signs of instability. 4. Multilevel cervical spondylosis. Electronically Signed   By: Carey Bullocks M.D.   On: 03/24/2021 15:59   DG Hip Unilat W or Wo Pelvis 2-3 Views Left  Result Date: 03/24/2021 CLINICAL DATA:  Weakness. EXAM: DG HIP (WITH OR WITHOUT PELVIS) 2-3V LEFT COMPARISON:  Abdominal radiographs 08/02/2016. Abdominopelvic CT 03/09/2013. FINDINGS: The mineralization and alignment are normal. There are advanced degenerative changes of both hips which have progressed compared with the prior studies. There are associated osteophytes, subchondral cysts and subchondral  sclerosis. There is a nondisplaced fracture of the left greater trochanter. No definite extension of this fracture cross the femoral neck or into the intertrochanteric region is identified. There are diffuse vascular calcifications status post aorto bi-iliac stent grafting. Patient is status post ventral hernia repair. IMPRESSION: 1. Acute fracture of the left greater trochanter. If the patient is unable to bear weight or there is additional clinical concern for intertrochanteric extension of this fracture, consider further evaluation with CT. 2. Advanced osteoarthritic changes of both hips. Electronically Signed   By: Carey Bullocks M.D.   On: 03/24/2021 15:37    Assessment/Plan **Hyponatremia:  He appears euvolemic currently.  His presentation is consistent with a 'tea and toast' or low solute intake by history though with his urine osm 316 he does have some ADH present.  With just correction of potassium his serum sodium is improving at an appropriate rate of 4-39mEq/24h.  I worry about overcorrection so will hold off on any other IVF (d/c LR IVF) for now and continue q4h monitoring.  Continue fluid restriction and regular diet.  Warned him this will take several days to correct accetablly.   **Hypokalemia: being repleted and trending up  **Hip fracture:  nonoperative management  Will follow closely, call with issues.   Tyler Pita 03/25/2021, 9:32 AM

## 2021-03-26 LAB — BASIC METABOLIC PANEL
Anion gap: 7 (ref 5–15)
Anion gap: 8 (ref 5–15)
BUN: 13 mg/dL (ref 8–23)
BUN: 11 mg/dL (ref 8–23)
CO2: 22 mmol/L (ref 22–32)
CO2: 23 mmol/L (ref 22–32)
Calcium: 8.4 mg/dL — ABNORMAL LOW (ref 8.9–10.3)
Calcium: 8.6 mg/dL — ABNORMAL LOW (ref 8.9–10.3)
Chloride: 93 mmol/L — ABNORMAL LOW (ref 98–111)
Chloride: 94 mmol/L — ABNORMAL LOW (ref 98–111)
Creatinine, Ser: 1.01 mg/dL (ref 0.61–1.24)
Creatinine, Ser: 1.1 mg/dL (ref 0.61–1.24)
GFR, Estimated: >60 mL/min (ref >60)
GFR, Estimated: >60 mL/min (ref >60)
Glucose, Bld: 112 mg/dL — ABNORMAL HIGH (ref 70–99)
Glucose, Bld: 138 mg/dL — ABNORMAL HIGH (ref 70–99)
Potassium: 3.9 mmol/L (ref 3.5–5.1)
Potassium: 4 mmol/L (ref 3.5–5.1)
Sodium: 122 mmol/L — ABNORMAL LOW (ref 135–145)
Sodium: 125 mmol/L — ABNORMAL LOW (ref 135–145)

## 2021-03-26 LAB — CBC WITH DIFFERENTIAL/PLATELET
Abs Immature Granulocytes: 0.04 10*3/uL (ref 0.00–0.07)
Basophils Absolute: 0 10*3/uL (ref 0.0–0.1)
Basophils Relative: 0 %
Eosinophils Absolute: 0 10*3/uL (ref 0.0–0.5)
Eosinophils Relative: 0 %
HCT: 27.7 % — ABNORMAL LOW (ref 39.0–52.0)
Hemoglobin: 10.1 g/dL — ABNORMAL LOW (ref 13.0–17.0)
Immature Granulocytes: 1 %
Lymphocytes Relative: 6 %
Lymphs Abs: 0.5 10*3/uL — ABNORMAL LOW (ref 0.7–4.0)
MCH: 34 pg (ref 26.0–34.0)
MCHC: 36.5 g/dL — ABNORMAL HIGH (ref 30.0–36.0)
MCV: 93.3 fL (ref 80.0–100.0)
Monocytes Absolute: 0.8 10*3/uL (ref 0.1–1.0)
Monocytes Relative: 10 %
Neutro Abs: 6.2 10*3/uL (ref 1.7–7.7)
Neutrophils Relative %: 83 %
Platelets: 189 10*3/uL (ref 150–400)
RBC: 2.97 MIL/uL — ABNORMAL LOW (ref 4.22–5.81)
RDW: 13.6 % (ref 11.5–15.5)
WBC: 7.5 10*3/uL (ref 4.0–10.5)
nRBC: 0 % (ref 0.0–0.2)

## 2021-03-26 LAB — COMPREHENSIVE METABOLIC PANEL
ALT: 24 U/L (ref 0–44)
AST: 34 U/L (ref 15–41)
Albumin: 2.9 g/dL — ABNORMAL LOW (ref 3.5–5.0)
Alkaline Phosphatase: 45 U/L (ref 38–126)
Anion gap: 8 (ref 5–15)
BUN: 12 mg/dL (ref 8–23)
CO2: 22 mmol/L (ref 22–32)
Calcium: 8.4 mg/dL — ABNORMAL LOW (ref 8.9–10.3)
Chloride: 92 mmol/L — ABNORMAL LOW (ref 98–111)
Creatinine, Ser: 1.04 mg/dL (ref 0.61–1.24)
GFR, Estimated: >60 mL/min (ref >60)
Glucose, Bld: 112 mg/dL — ABNORMAL HIGH (ref 70–99)
Potassium: 3.9 mmol/L (ref 3.5–5.1)
Sodium: 122 mmol/L — ABNORMAL LOW (ref 135–145)
Total Bilirubin: 1.7 mg/dL — ABNORMAL HIGH (ref 0.3–1.2)
Total Protein: 5.2 g/dL — ABNORMAL LOW (ref 6.5–8.1)

## 2021-03-26 LAB — RENAL FUNCTION PANEL
Albumin: 3.2 g/dL — ABNORMAL LOW (ref 3.5–5.0)
Anion gap: 8 (ref 5–15)
BUN: 12 mg/dL (ref 8–23)
CO2: 25 mmol/L (ref 22–32)
Calcium: 8.6 mg/dL — ABNORMAL LOW (ref 8.9–10.3)
Chloride: 91 mmol/L — ABNORMAL LOW (ref 98–111)
Creatinine, Ser: 1.22 mg/dL (ref 0.61–1.24)
GFR, Estimated: 59 mL/min — ABNORMAL LOW (ref >60)
Glucose, Bld: 112 mg/dL — ABNORMAL HIGH (ref 70–99)
Phosphorus: 2.5 mg/dL (ref 2.5–4.6)
Potassium: 4 mmol/L (ref 3.5–5.1)
Sodium: 124 mmol/L — ABNORMAL LOW (ref 135–145)

## 2021-03-26 LAB — BRAIN NATRIURETIC PEPTIDE: B Natriuretic Peptide: 218.4 pg/mL — ABNORMAL HIGH (ref 0.0–100.0)

## 2021-03-26 LAB — MAGNESIUM: Magnesium: 1.9 mg/dL (ref 1.7–2.4)

## 2021-03-26 MED ORDER — SODIUM CHLORIDE 0.9 % IV SOLN: INTRAVENOUS | Status: DC

## 2021-03-26 MED ORDER — DIGOXIN 0.25 MG/ML IJ SOLN
0.2500 mg | Freq: Four times a day (QID) | INTRAMUSCULAR | Status: AC
Start: 2021-03-26 — End: 2021-03-26
  Administered 2021-03-26 (×2): 0.25 mg via INTRAVENOUS
  Filled 2021-03-26 (×2): qty 2

## 2021-03-26 MED ORDER — ACETAMINOPHEN 500 MG PO TABS
500.0000 mg | ORAL_TABLET | Freq: Four times a day (QID) | ORAL | Status: DC | PRN
  Administered 2021-03-26: 500 mg via ORAL
  Filled 2021-03-26: qty 1

## 2021-03-26 NOTE — TOC Initial Note (Addendum)
Transition of Care [facility]) - Initial/Assessment Note    Patient Details  Name: Eugene [name] MRN: [id number] Date of Birth: Feb 23, 1936  Transition of Care [facility]) CM/SW Contact:    Lockie Pares, RN Phone Number: 03/26/2021, 2:06 PM  Clinical Narrative:                 85 year old patient admitted for frequent falls, hip pain fx.hyponutremia Ortho to re-evaluate 10/31.   Received request from CSW to speka to family regarding home health, patient helps take care of wife, refusing  SNF. Called into room as this RNCM is remote. Spoke to son, who stated his sister would be back shortly and would like to see someone face to face instead of via phone. Message sent to CM to see if they could meet with family.  Spoke to patient to let him know someone would be able to meet with them tomorrow face to face.   Expected Discharge Plan: Home w Home Health Services     Patient Goals and CMS Choice        Expected Discharge Plan and Services Expected Discharge Plan: Home w Home Health Services   Discharge Planning Services: CM Consult Post Acute Care Choice: Durable Medical Equipment Living arrangements for the past 2 months: Single Family Home                           HH Arranged: PT          Prior Living Arrangements/Services Living arrangements for the past 2 months: Single Family Home Lives with:: Spouse Patient language and need for interpreter reviewed:: Yes        Need for Family Participation in Patient Care: Yes (Comment) Care giver support system in place?: Yes (comment)   Criminal Activity/Legal Involvement Pertinent to Current Situation/Hospitalization: No - Comment as needed  Activities of Daily Living Home Assistive Devices/Equipment: Eyeglasses ADL Screening (condition at time of admission) Patient's cognitive ability adequate to safely complete daily activities?: No Is the patient deaf or have difficulty hearing?: No Does the patient have difficulty  seeing, even when wearing glasses/contacts?: No Does the patient have difficulty concentrating, remembering, or making decisions?: No Patient able to express need for assistance with ADLs?: Yes Does the patient have difficulty dressing or bathing?: No Independently performs ADLs?: Yes (appropriate for developmental age) Does the patient have difficulty walking or climbing stairs?: Yes Weakness of Legs: Both Weakness of Arms/Hands: None  Permission Sought/Granted                  Emotional Assessment         Alcohol / Substance Use: Not Applicable Psych Involvement: No (comment)  Admission diagnosis:  Hypokalemia [E87.6] Hyponatremia [E87.1] Shoulder pain [M25.519] Fall [W19.XXXA] Closed nondisplaced fracture of greater trochanter of left femur, initial encounter (HCC) [S72.115A] Patient Active Problem List   Diagnosis Date Noted   Hyponatremia 03/24/2021   Greater trochanter fracture (HCC) 03/24/2021   Hypokalemia 03/24/2021   Hypomagnesemia 03/24/2021   Hypochloremia 03/24/2021   Aortic stenosis 09/05/2017   PICC (peripherally inserted central catheter) flush 02/19/2017   Benign essential HTN 02/10/2014   Permanent atrial fibrillation (HCC)    GERD (gastroesophageal reflux disease)    Aftercare following surgery of the circulatory system, NEC 10/13/2012   Occlusion and stenosis of carotid artery without mention of cerebral infarction 09/03/2011   Abdominal aneurysm without mention of rupture 09/03/2011   Hemochromatosis 08/29/2011  PCP:  [name], MD Pharmacy:   [facility] [phone], [place] - [address] AT [facility] [address] [place] [address] Phone: [phone] Fax: [phone]  [facility] [address] [place] [address] Phone: [phone] Fax: [phone]     Social Determinants of Health (SDOH) Interventions    Readmission Risk Interventions No flowsheet data found.

## 2021-03-26 NOTE — Progress Notes (Signed)
[facility] Progress Note   Subjective:   No new issues.  Serum sodium has stalled at 122 so he rec'd bolus and improved to 125.  Has a lot of hip pain, can't sleep.   I/Os 240 / 825  Objective Vitals:   03/25/21 1129 03/25/21 1953 03/26/21 0342 03/26/21 0759  BP: 108/64 99/69 102/69 127/72  Pulse: 99 (!) 109 (!) 104 100  Resp:  16 16   Temp:  99.1 F (37.3 C) 98.1 F (36.7 C) 97.8 F (36.6 C)  TempSrc:  Oral Oral Oral  SpO2:  98% 97% 98%  Weight:   87 kg   Height:       Physical Exam Gen: elderly man sitting in bed in no distress Eyes:  anicteric, EOMI, glasses ENT: MMM Neck: supple, no JVD CV:  HR in low 100s Back: clear GU: no foley Extr:  no edema Neuro: grossly nonfocal and sharp with details  Additional Objective Labs: Basic Metabolic Panel: Recent Labs  Lab 03/25/21 1605 03/25/21 2009 03/26/21 0114  NA 122* 122* 122*  122*  K 3.8 4.0 3.9  3.9  CL 88* 90* 93*  92*  CO2 23 24 22  22   GLUCOSE 96 133* 112*  112*  BUN 14 14 13  12   CREATININE 1.09 1.13 1.01  1.04  CALCIUM 8.7* 8.7* 8.4*  8.4*   Liver Function Tests: Recent Labs  Lab 03/24/21 1700 03/26/21 0114  AST 65* 34  ALT 32 24  ALKPHOS 52 45  BILITOT 3.3* 1.7*  PROT 6.3* 5.2*  ALBUMIN 3.7 2.9*   No results for input(s): LIPASE, AMYLASE in the last 168 hours. CBC: Recent Labs  Lab 03/24/21 1404 03/25/21 0308 03/26/21 0114  WBC 11.1* 7.6 7.5  NEUTROABS  --   --  6.2  HGB 13.3 10.9* 10.1*  HCT 35.8* 29.9* 27.7*  MCV 91.6 92.0 93.3  PLT 225 184 189   Blood Culture No results found for: SDES, SPECREQUEST, CULT, REPTSTATUS  Cardiac Enzymes: No results for input(s): CKTOTAL, CKMB, CKMBINDEX, TROPONINI in the last 168 hours. CBG: Recent Labs  Lab 03/24/21 1731  GLUCAP 120*   Iron Studies: No results for input(s): IRON, TIBC, TRANSFERRIN, FERRITIN in the last 72 hours. @lablastinr3 @ Studies/Results: DG Chest 2 View  Result Date:  03/24/2021 CLINICAL DATA:  Weakness. EXAM: CHEST - 2 VIEW COMPARISON:  Radiographs 05/26/2019.  CT 11/06/2005. FINDINGS: The heart size and mediastinal contours are stable with aortic atherosclerosis. There is mild left basilar atelectasis. The lungs otherwise appear clear, without confluent airspace opacity, edema or pleural effusion. No acute fractures are seen. There are mild degenerative changes in the spine. IMPRESSION: No evidence of active cardiopulmonary process. Electronically Signed   By: 03/26/2021 M.D.   On: 03/24/2021 15:33   DG Shoulder Right  Result Date: 03/24/2021 CLINICAL DATA:  Right shoulder pain after fall. EXAM: RIGHT SHOULDER - 2+ VIEW COMPARISON:  Radiograph 04/05/2020 FINDINGS: There is no evidence of fracture or dislocation. Moderate acromioclavicular degenerative change. Small subacromial spur. There is soft tissue calcifications adjacent to the lateral humeral head, not seen on prior exam. No erosion or bone destruction. IMPRESSION: 1. No fracture or dislocation of the right shoulder. 2. Soft tissue calcifications adjacent to the lateral humeral head, new from prior exam, may represent calcific tendinopathy or bursitis. Electronically Signed   By: 03/26/2021 M.D.   On: 03/24/2021 21:50   CT HEAD WO CONTRAST (13/01/2020)  Result Date: 03/24/2021 CLINICAL DATA:  Generalized weakness that started 1 week ago. Multiple falls. EXAM: CT HEAD WITHOUT CONTRAST CT CERVICAL SPINE WITHOUT CONTRAST TECHNIQUE: Multidetector CT imaging of the head and cervical spine was performed following the standard protocol without intravenous contrast. Multiplanar CT image reconstructions of the cervical spine were also generated. COMPARISON:  None. FINDINGS: CT HEAD FINDINGS Brain: There is no evidence of acute intracranial hemorrhage, mass lesion, brain edema or extra-axial fluid collection. Mild atrophy with prominence of the ventricles and subarachnoid spaces. The dilatation of the lateral and  3rd ventricles is in slight excess of the sulcal prominence, but there is no definite hydrocephalus. Mild patchy low-density in the periventricular white matter likely reflects chronic small vessel ischemic changes. There is no CT evidence of acute cortical infarction. Vascular: Intracranial vascular calcifications. No hyperdense vessel identified. Skull: Negative for fracture or focal lesion. Sinuses/Orbits: The visualized paranasal sinuses and mastoid air cells are clear. No orbital abnormalities are seen. Other: None. CT CERVICAL SPINE FINDINGS Alignment: Normal. Skull base and vertebrae: No evidence of acute cervical spine fracture or traumatic subluxation. Soft tissues and spinal canal: No prevertebral fluid or swelling. No visible canal hematoma. There is synovial calcifications surrounding the odontoid process without osseous erosion or mass effect on the craniocervical junction. Disc levels: Multilevel cervical spondylosis with disc bulging, uncinate spurring and facet hypertrophy. There is mild to moderate resulting osseous foraminal narrowing which appears worst at the C4-5 and C6-7 levels. Upper chest: Emphysematous changes and mild scarring at both lung apices. Severe bilateral carotid atherosclerosis. Other: None. IMPRESSION: 1. No acute intracranial or calvarial findings. 2. Atrophy and mild chronic small vessel ischemic changes. 3. No evidence of acute cervical spine fracture, traumatic subluxation or static signs of instability. 4. Multilevel cervical spondylosis. Electronically Signed   By: Carey Bullocks M.D.   On: 03/24/2021 15:59   CT Cervical Spine Wo Contrast  Result Date: 03/24/2021 CLINICAL DATA:  Generalized weakness that started 1 week ago. Multiple falls. EXAM: CT HEAD WITHOUT CONTRAST CT CERVICAL SPINE WITHOUT CONTRAST TECHNIQUE: Multidetector CT imaging of the head and cervical spine was performed following the standard protocol without intravenous contrast. Multiplanar CT image  reconstructions of the cervical spine were also generated. COMPARISON:  None. FINDINGS: CT HEAD FINDINGS Brain: There is no evidence of acute intracranial hemorrhage, mass lesion, brain edema or extra-axial fluid collection. Mild atrophy with prominence of the ventricles and subarachnoid spaces. The dilatation of the lateral and 3rd ventricles is in slight excess of the sulcal prominence, but there is no definite hydrocephalus. Mild patchy low-density in the periventricular white matter likely reflects chronic small vessel ischemic changes. There is no CT evidence of acute cortical infarction. Vascular: Intracranial vascular calcifications. No hyperdense vessel identified. Skull: Negative for fracture or focal lesion. Sinuses/Orbits: The visualized paranasal sinuses and mastoid air cells are clear. No orbital abnormalities are seen. Other: None. CT CERVICAL SPINE FINDINGS Alignment: Normal. Skull base and vertebrae: No evidence of acute cervical spine fracture or traumatic subluxation. Soft tissues and spinal canal: No prevertebral fluid or swelling. No visible canal hematoma. There is synovial calcifications surrounding the odontoid process without osseous erosion or mass effect on the craniocervical junction. Disc levels: Multilevel cervical spondylosis with disc bulging, uncinate spurring and facet hypertrophy. There is mild to moderate resulting osseous foraminal narrowing which appears worst at the C4-5 and C6-7 levels. Upper chest: Emphysematous changes and mild scarring at both lung apices. Severe bilateral carotid atherosclerosis. Other: None. IMPRESSION: 1. No acute intracranial or calvarial findings. 2. Atrophy  and mild chronic small vessel ischemic changes. 3. No evidence of acute cervical spine fracture, traumatic subluxation or static signs of instability. 4. Multilevel cervical spondylosis. Electronically Signed   By: Carey Bullocks M.D.   On: 03/24/2021 15:59   DG Hip Unilat W or Wo Pelvis 2-3  Views Left  Result Date: 03/24/2021 CLINICAL DATA:  Weakness. EXAM: DG HIP (WITH OR WITHOUT PELVIS) 2-3V LEFT COMPARISON:  Abdominal radiographs 08/02/2016. Abdominopelvic CT 03/09/2013. FINDINGS: The mineralization and alignment are normal. There are advanced degenerative changes of both hips which have progressed compared with the prior studies. There are associated osteophytes, subchondral cysts and subchondral sclerosis. There is a nondisplaced fracture of the left greater trochanter. No definite extension of this fracture cross the femoral neck or into the intertrochanteric region is identified. There are diffuse vascular calcifications status post aorto bi-iliac stent grafting. Patient is status post ventral hernia repair. IMPRESSION: 1. Acute fracture of the left greater trochanter. If the patient is unable to bear weight or there is additional clinical concern for intertrochanteric extension of this fracture, consider further evaluation with CT. 2. Advanced osteoarthritic changes of both hips. Electronically Signed   By: Carey Bullocks M.D.   On: 03/24/2021 15:37   Medications:   ALPRAZolam  0.5 mg Oral BID   apixaban  5 mg Oral BID   atorvastatin  20 mg Oral Daily   digoxin  0.25 mg Intravenous Q6H   metoprolol tartrate  25 mg Oral BID    Assessment/Plan **Hyponatremia:  He appears euvolemic currently. Suspect with history combination of poor oral intake + HCTZ.  With just correction of potassium his serum sodium has been improving at an appropriate rate of 4-69mEq/24h over the last ~36h. Stalled but then further improved with isotonic fluids.   Continue fluid restriction and regular diet.  Hold HCTZ indefinitely.    **Hypokalemia: now 4  from 2.8 after repletion  **HTN: on HCTZ, ramipril and metoprolol at home.  BB on here for A fib but others held.  BP on low side.    **Hip fracture:  nonoperative management   Will follow closely, call with issues.   Estill Bakes  MD 03/26/2021, 9:12 AM  [facility] Pager: [phone]

## 2021-03-26 NOTE — Progress Notes (Signed)
PROGRESS NOTE                                                                                                                                                                                                             Patient Demographics:    Eugene [name], is a 85 y.o. male, DOB - February 24, 1936, YQI:[id number]  Outpatient Primary MD for the patient is [name], MD    LOS - 2  Admit date - 03/24/2021    Chief Complaint  Patient presents with   Weakness       Brief Narrative (HPI from H&P)    Eugene [name] is a 85 y.o. male with medical history significant of permanent A. fib on Eliquis, mild aortic stenosis/regurgitation, hypertension, hyperlipidemia, CVA/TIA, IBS, hemochromatosis followed by Dr. Darnelle Catalan, GERD, AAA status post repair presented to the ED complaining of generalized weakness, multiple falls, and acute onset left hip pain.  He presented to the ER with generalized weakness and ongoing left hip pain was diagnosed with severe dehydration, hypokalemia, hypomagnesemia, left greater trochanteric fracture, renal and orthopedics were consulted and he was admitted to the hospital.   Subjective:   Patient in bed, appears comfortable, denies any headache, no fever, no chest pain or pressure, no shortness of breath , no abdominal pain. Some generalized weakness, left hip pain, he also has right shoulder pain ongoing.    Assessment  & Plan :     Severe Hyponatremia, Hypotension, Dehydration - multiple falls - all likely from HCTZ and PPI, BP Meds held, PPI discontinued, potassium and magnesium have been replaced, renal is following and managing hyponatremia and dehydration.  2.  Multiple falls with left greater trochanteric fracture, hip pain.  Seen by Ortho medical management.  Commence PT OT once patient is able to, may require placement to SNF.  Also has right shoulder pain, right shoulder x-rays have been  stable will request Ortho to reevaluate on 03/27/2021 I am wondering if he has sustained rotator cuff injury.  3. HTN.  Blood pressure is low, blood pressure medications held, gentle hydration  4.  Hypokalemia and hypomagnesemia.  Replace and monitor.  5.  Permanent A. fib with [place] vas 2 score of 3.  Continue Eliquis with caution, beta-blocker if blood pressure tolerates, will give 2 doses of digoxin in the short-term for rate control along  with amiodarone if all fails.  6.  Dyslipidemia.  On statin.  7.  History of hemochromatosis.  Outpatient follow-up.  8.  Anxiety and depression.  Trazodone.      Condition - Fair  Family Communication  :  wife [name] [phone] - on 03/25/21 @ 9.21 - message left.  Code Status :  Full  Consults  :  Renal, Ortho  PUD Prophylaxis :    Procedures  :      CT - Head and C Spine - 1. No acute intracranial or calvarial findings. 2. Atrophy and mild chronic small vessel ischemic changes. 3. No evidence of acute cervical spine fracture, traumatic subluxation or static signs of instability. 4. Multilevel cervical spondylosis.      Disposition Plan  :    Status is: Inpatient  Remains inpatient appropriate because: Severe Hyponatremia, left hip fracture, right shoulder injury  DVT Prophylaxis  :    apixaban (ELIQUIS) tablet 5 mg    Lab Results  Component Value Date   PLT 189 03/26/2021    Diet :  Diet Order             Diet regular Room service appropriate? Yes; Fluid consistency: Thin; Fluid restriction: 1500 mL Fluid  Diet effective now                    Inpatient Medications  Scheduled Meds:  ALPRAZolam  0.5 mg Oral BID   apixaban  5 mg Oral BID   atorvastatin  20 mg Oral Daily   digoxin  0.25 mg Intravenous Q6H   metoprolol tartrate  25 mg Oral BID   Continuous Infusions:   PRN Meds:.metoprolol tartrate, traMADol, traZODone  Antibiotics  :    Anti-infectives (From admission, onward)    None        Time Spent in minutes  30   Susa Raring M.D on 03/26/2021 at 8:57 AM  To page go to [email or web address]   [facility] -  Office  [phone]  See all Orders from today for further details    Objective:   Vitals:   03/25/21 1129 03/25/21 1953 03/26/21 0342 03/26/21 0759  BP: 108/64 99/69 102/69 127/72  Pulse: 99 (!) 109 (!) 104 100  Resp:  16 16   Temp:  99.1 F (37.3 C) 98.1 F (36.7 C) 97.8 F (36.6 C)  TempSrc:  Oral Oral Oral  SpO2:  98% 97% 98%  Weight:   87 kg   Height:        Wt Readings from Last 3 Encounters:  03/26/21 87 kg  02/15/21 88.2 kg  05/16/20 87.9 kg     Intake/Output Summary (Last 24 hours) at 03/26/2021 0857 Last data filed at 03/25/2021 2034 Gross per 24 hour  Intake 240 ml  Output 825 ml  Net -585 ml     Physical Exam  Awake Alert, No new F.N deficits, Normal affect [place].AT,PERRAL Supple Neck, No JVD,   Symmetrical Chest wall movement, Good air movement bilaterally, CTAB RRR,No Gallops, Rubs or new Murmurs,  +ve B.Sounds, Abd Soft, No tenderness,   No Cyanosis, L hip tender to palpate, R shoulder reduced ROM - active        Data Review:    CBC Recent Labs  Lab 03/24/21 1404 03/25/21 0308 03/26/21 0114  WBC 11.1* 7.6 7.5  HGB 13.3 10.9* 10.1*  HCT 35.8* 29.9* 27.7*  PLT 225 184 189  MCV 91.6 92.0 93.3  MCH 34.0 33.5 34.0  MCHC  37.2* 36.5* 36.5*  RDW 13.2 13.2 13.6  LYMPHSABS  --   --  0.5*  MONOABS  --   --  0.8  EOSABS  --   --  0.0  BASOSABS  --   --  0.0    Recent Labs  Lab 03/24/21 1700 03/24/21 2227 03/25/21 0308 03/25/21 0853 03/25/21 1158 03/25/21 1605 03/25/21 2009 03/26/21 0114  NA 119*   < > 119* 121* 120* 122* 122* 122*  122*  K 3.1*   < > 3.1* 3.0* 3.2* 3.8 4.0 3.9  3.9  CL 82*   < > 85* 87* 86* 88* 90* 93*  92*  CO2 22   < > 23 23 25 23 24 22  22   GLUCOSE 112*   < > 101* 148* 112* 96 133* 112*  112*  BUN 16   < > 15 15 14 14 14 13  12   CREATININE 1.07   < > 1.02 1.06 1.07  1.09 1.13 1.01  1.04  CALCIUM 9.3   < > 8.6* 8.8* 8.7* 8.7* 8.7* 8.4*  8.4*  AST 65*  --   --   --   --   --   --  34  ALT 32  --   --   --   --   --   --  24  ALKPHOS 52  --   --   --   --   --   --  45  BILITOT 3.3*  --   --   --   --   --   --  1.7*  ALBUMIN 3.7  --   --   --   --   --   --  2.9*  MG 1.6*  --  1.9  --   --   --   --  1.9  INR 1.3*  --   --   --   --   --   --   --   TSH  --   --  1.160  --   --   --   --   --   BNP  --   --   --   --   --   --   --  218.4*   < > = values in this interval not displayed.    ------------------------------------------------------------------------------------------------------------------ No results for input(s): CHOL, HDL, LDLCALC, TRIG, CHOLHDL, LDLDIRECT in the last 72 hours.  No results found for: HGBA1C ------------------------------------------------------------------------------------------------------------------ Recent Labs    03/25/21 0308  TSH 1.160    Cardiac Enzymes No results for input(s): CKMB, TROPONINI, MYOGLOBIN in the last 168 hours.  Invalid input(s): CK ------------------------------------------------------------------------------------------------------------------    Component Value Date/Time   BNP 218.4 (H) 03/26/2021 0114     Radiology Reports DG Chest 2 View  Result Date: 03/24/2021 CLINICAL DATA:  Weakness. EXAM: CHEST - 2 VIEW COMPARISON:  Radiographs 05/26/2019.  CT 11/06/2005. FINDINGS: The heart size and mediastinal contours are stable with aortic atherosclerosis. There is mild left basilar atelectasis. The lungs otherwise appear clear, without confluent airspace opacity, edema or pleural effusion. No acute fractures are seen. There are mild degenerative changes in the spine. IMPRESSION: No evidence of active cardiopulmonary process. Electronically Signed   By: Carey Bullocks M.D.   On: 03/24/2021 15:33   DG Shoulder Right  Result Date: 03/24/2021 CLINICAL DATA:  Right shoulder pain after  fall. EXAM: RIGHT SHOULDER - 2+ VIEW COMPARISON:  Radiograph 04/05/2020 FINDINGS: There is no evidence  of fracture or dislocation. Moderate acromioclavicular degenerative change. Small subacromial spur. There is soft tissue calcifications adjacent to the lateral humeral head, not seen on prior exam. No erosion or bone destruction. IMPRESSION: 1. No fracture or dislocation of the right shoulder. 2. Soft tissue calcifications adjacent to the lateral humeral head, new from prior exam, may represent calcific tendinopathy or bursitis. Electronically Signed   By: Narda Rutherford M.D.   On: 03/24/2021 21:50   CT HEAD WO CONTRAST ( )  Result Date: 03/24/2021 CLINICAL DATA:  Generalized weakness that started 1 week ago. Multiple falls. EXAM: CT HEAD WITHOUT CONTRAST CT CERVICAL SPINE WITHOUT CONTRAST TECHNIQUE: Multidetector CT imaging of the head and cervical spine was performed following the standard protocol without intravenous contrast. Multiplanar CT image reconstructions of the cervical spine were also generated. COMPARISON:  None. FINDINGS: CT HEAD FINDINGS Brain: There is no evidence of acute intracranial hemorrhage, mass lesion, brain edema or extra-axial fluid collection. Mild atrophy with prominence of the ventricles and subarachnoid spaces. The dilatation of the lateral and 3rd ventricles is in slight excess of the sulcal prominence, but there is no definite hydrocephalus. Mild patchy low-density in the periventricular white matter likely reflects chronic small vessel ischemic changes. There is no CT evidence of acute cortical infarction. Vascular: Intracranial vascular calcifications. No hyperdense vessel identified. Skull: Negative for fracture or focal lesion. Sinuses/Orbits: The visualized paranasal sinuses and mastoid air cells are clear. No orbital abnormalities are seen. Other: None. CT CERVICAL SPINE FINDINGS Alignment: Normal. Skull base and vertebrae: No evidence of acute cervical spine fracture  or traumatic subluxation. Soft tissues and spinal canal: No prevertebral fluid or swelling. No visible canal hematoma. There is synovial calcifications surrounding the odontoid process without osseous erosion or mass effect on the craniocervical junction. Disc levels: Multilevel cervical spondylosis with disc bulging, uncinate spurring and facet hypertrophy. There is mild to moderate resulting osseous foraminal narrowing which appears worst at the C4-5 and C6-7 levels. Upper chest: Emphysematous changes and mild scarring at both lung apices. Severe bilateral carotid atherosclerosis. Other: None. IMPRESSION: 1. No acute intracranial or calvarial findings. 2. Atrophy and mild chronic small vessel ischemic changes. 3. No evidence of acute cervical spine fracture, traumatic subluxation or static signs of instability. 4. Multilevel cervical spondylosis. Electronically Signed   By: Carey Bullocks M.D.   On: 03/24/2021 15:59   CT Cervical Spine Wo Contrast  Result Date: 03/24/2021 CLINICAL DATA:  Generalized weakness that started 1 week ago. Multiple falls. EXAM: CT HEAD WITHOUT CONTRAST CT CERVICAL SPINE WITHOUT CONTRAST TECHNIQUE: Multidetector CT imaging of the head and cervical spine was performed following the standard protocol without intravenous contrast. Multiplanar CT image reconstructions of the cervical spine were also generated. COMPARISON:  None. FINDINGS: CT HEAD FINDINGS Brain: There is no evidence of acute intracranial hemorrhage, mass lesion, brain edema or extra-axial fluid collection. Mild atrophy with prominence of the ventricles and subarachnoid spaces. The dilatation of the lateral and 3rd ventricles is in slight excess of the sulcal prominence, but there is no definite hydrocephalus. Mild patchy low-density in the periventricular white matter likely reflects chronic small vessel ischemic changes. There is no CT evidence of acute cortical infarction. Vascular: Intracranial vascular  calcifications. No hyperdense vessel identified. Skull: Negative for fracture or focal lesion. Sinuses/Orbits: The visualized paranasal sinuses and mastoid air cells are clear. No orbital abnormalities are seen. Other: None. CT CERVICAL SPINE FINDINGS Alignment: Normal. Skull base and vertebrae: No evidence of acute cervical spine fracture or traumatic subluxation.  Soft tissues and spinal canal: No prevertebral fluid or swelling. No visible canal hematoma. There is synovial calcifications surrounding the odontoid process without osseous erosion or mass effect on the craniocervical junction. Disc levels: Multilevel cervical spondylosis with disc bulging, uncinate spurring and facet hypertrophy. There is mild to moderate resulting osseous foraminal narrowing which appears worst at the C4-5 and C6-7 levels. Upper chest: Emphysematous changes and mild scarring at both lung apices. Severe bilateral carotid atherosclerosis. Other: None. IMPRESSION: 1. No acute intracranial or calvarial findings. 2. Atrophy and mild chronic small vessel ischemic changes. 3. No evidence of acute cervical spine fracture, traumatic subluxation or static signs of instability. 4. Multilevel cervical spondylosis. Electronically Signed   By: Carey Bullocks M.D.   On: 03/24/2021 15:59   DG Hip Unilat W or Wo Pelvis 2-3 Views Left  Result Date: 03/24/2021 CLINICAL DATA:  Weakness. EXAM: DG HIP (WITH OR WITHOUT PELVIS) 2-3V LEFT COMPARISON:  Abdominal radiographs 08/02/2016. Abdominopelvic CT 03/09/2013. FINDINGS: The mineralization and alignment are normal. There are advanced degenerative changes of both hips which have progressed compared with the prior studies. There are associated osteophytes, subchondral cysts and subchondral sclerosis. There is a nondisplaced fracture of the left greater trochanter. No definite extension of this fracture cross the femoral neck or into the intertrochanteric region is identified. There are diffuse vascular  calcifications status post aorto bi-iliac stent grafting. Patient is status post ventral hernia repair. IMPRESSION: 1. Acute fracture of the left greater trochanter. If the patient is unable to bear weight or there is additional clinical concern for intertrochanteric extension of this fracture, consider further evaluation with CT. 2. Advanced osteoarthritic changes of both hips. Electronically Signed   By: Carey Bullocks M.D.   On: 03/24/2021 15:37

## 2021-03-27 LAB — COMPREHENSIVE METABOLIC PANEL
ALT: 25 U/L (ref 0–44)
AST: 24 U/L (ref 15–41)
Albumin: 2.7 g/dL — ABNORMAL LOW (ref 3.5–5.0)
Alkaline Phosphatase: 43 U/L (ref 38–126)
Anion gap: 7 (ref 5–15)
BUN: 11 mg/dL (ref 8–23)
CO2: 23 mmol/L (ref 22–32)
Calcium: 8.4 mg/dL — ABNORMAL LOW (ref 8.9–10.3)
Chloride: 93 mmol/L — ABNORMAL LOW (ref 98–111)
Creatinine, Ser: 1 mg/dL (ref 0.61–1.24)
GFR, Estimated: >60 mL/min (ref >60)
Glucose, Bld: 106 mg/dL — ABNORMAL HIGH (ref 70–99)
Potassium: 3.7 mmol/L (ref 3.5–5.1)
Sodium: 123 mmol/L — ABNORMAL LOW (ref 135–145)
Total Bilirubin: 1.6 mg/dL — ABNORMAL HIGH (ref 0.3–1.2)
Total Protein: 5.2 g/dL — ABNORMAL LOW (ref 6.5–8.1)

## 2021-03-27 LAB — CBC WITH DIFFERENTIAL/PLATELET
Abs Immature Granulocytes: 0.05 10*3/uL (ref 0.00–0.07)
Basophils Absolute: 0 10*3/uL (ref 0.0–0.1)
Basophils Relative: 0 %
Eosinophils Absolute: 0.1 10*3/uL (ref 0.0–0.5)
Eosinophils Relative: 2 %
HCT: 28.6 % — ABNORMAL LOW (ref 39.0–52.0)
Hemoglobin: 10 g/dL — ABNORMAL LOW (ref 13.0–17.0)
Immature Granulocytes: 1 %
Lymphocytes Relative: 11 %
Lymphs Abs: 0.8 10*3/uL (ref 0.7–4.0)
MCH: 33.4 pg (ref 26.0–34.0)
MCHC: 35 g/dL (ref 30.0–36.0)
MCV: 95.7 fL (ref 80.0–100.0)
Monocytes Absolute: 1 10*3/uL (ref 0.1–1.0)
Monocytes Relative: 13 %
Neutro Abs: 5.7 10*3/uL (ref 1.7–7.7)
Neutrophils Relative %: 73 %
Platelets: 207 10*3/uL (ref 150–400)
RBC: 2.99 MIL/uL — ABNORMAL LOW (ref 4.22–5.81)
RDW: 13.7 % (ref 11.5–15.5)
WBC: 7.7 10*3/uL (ref 4.0–10.5)
nRBC: 0 % (ref 0.0–0.2)

## 2021-03-27 LAB — SODIUM, URINE, RANDOM: Sodium, Ur: 47 mmol/L

## 2021-03-27 LAB — UREA NITROGEN, URINE: Urea Nitrogen, Ur: 438 mg/dL

## 2021-03-27 LAB — MAGNESIUM: Magnesium: 1.8 mg/dL (ref 1.7–2.4)

## 2021-03-27 LAB — SODIUM
Sodium: 125 mmol/L — ABNORMAL LOW (ref 135–145)
Sodium: 126 mmol/L — ABNORMAL LOW (ref 135–145)
Sodium: 124 mmol/L — ABNORMAL LOW (ref 135–145)

## 2021-03-27 LAB — OSMOLALITY, URINE: Osmolality, Ur: 358 mOsm/kg (ref 300–900)

## 2021-03-27 LAB — BRAIN NATRIURETIC PEPTIDE: B Natriuretic Peptide: 519.8 pg/mL — ABNORMAL HIGH (ref 0.0–100.0)

## 2021-03-27 MED ORDER — UREA 15 G PO PACK
30.0000 g | PACK | Freq: Two times a day (BID) | ORAL | Status: DC
  Administered 2021-03-27 (×2): 30 g via ORAL
  Filled 2021-03-27 (×3): qty 2

## 2021-03-27 NOTE — Progress Notes (Signed)
PROGRESS NOTE                                                                                                                                                                                                             Patient Demographics:    Eugene [name], is a 85 y.o. male, DOB - [birth year], [id number]  Outpatient Primary MD for the patient is [name], MD    LOS - 3  Admit date - 03/24/2021    Chief Complaint  Patient presents with   Weakness       Brief Narrative (HPI from H&P)    Eugene [name] is a 85 y.o. male with medical history significant of permanent A. fib on Eliquis, mild aortic stenosis/regurgitation, hypertension, hyperlipidemia, CVA/TIA, IBS, hemochromatosis followed by [name], GERD, AAA status post repair presented to the ED complaining of generalized weakness, multiple falls, and acute onset left hip pain.  He presented to the ER with generalized weakness and ongoing left hip pain was diagnosed with severe dehydration, hypokalemia, hypomagnesemia, left greater trochanteric fracture, renal and orthopedics were consulted and he was admitted to the hospital.   Subjective:   Patient in bed, appears comfortable, denies any headache, no fever, no chest pain or pressure, no shortness of breath , no abdominal pain. No new focal weakness, R shoulder pain.   Assessment  & Plan :     Severe Hyponatremia, Hypotension, Dehydration - multiple falls - all likely from HCTZ and PPI, BP Meds held, PPI discontinued, potassium and magnesium have been replaced, renal is following and managing hyponatremia and dehydration.  2.  Multiple falls with left greater trochanteric fracture, hip pain.  Seen by Ortho medical management.  Commence PT OT once patient is able to, may require placement to SNF.  Also has right shoulder pain, right shoulder x-rays have been stable will get MRI, [facility] 03/27/21,  wondering if he has sustained rotator cuff injury.  3. HTN.  Blood pressure is low, blood pressure medications held, post gentle hydration  4.  Hypokalemia and hypomagnesemia.  Replace and monitor.  5.  Permanent A. fib with [place] vas 2 score of 3.  Continue Eliquis with caution, beta-blocker if blood pressure tolerates, will give 2 doses of digoxin in the short-term for rate control along with amiodarone if all fails.  6.  Dyslipidemia.  On statin.  7.  History of hemochromatosis.  Outpatient follow-up.  8.  Anxiety and depression.  Trazodone.      Condition - Fair  Family Communication  :    Hadley Pen [phone] 03/27/21  wife [name] [phone] - on 03/25/21 @ 9.21 - message left.  Code Status :  Full  Consults  :  Renal, Ortho  PUD Prophylaxis :    Procedures  :      CT - Head and C Spine - 1. No acute intracranial or calvarial findings. 2. Atrophy and mild chronic small vessel ischemic changes. 3. No evidence of acute cervical spine fracture, traumatic subluxation or static signs of instability. 4. Multilevel cervical spondylosis.      Disposition Plan  :    Status is: Inpatient  Remains inpatient appropriate because: Severe Hyponatremia, left hip fracture, right shoulder injury  DVT Prophylaxis  :    apixaban (ELIQUIS) tablet 5 mg    Lab Results  Component Value Date   PLT 207 03/27/2021    Diet :  Diet Order             Diet regular Room service appropriate? Yes; Fluid consistency: Thin; Fluid restriction: 1500 mL Fluid  Diet effective now                    Inpatient Medications  Scheduled Meds:  ALPRAZolam  0.5 mg Oral BID   apixaban  5 mg Oral BID   atorvastatin  20 mg Oral Daily   metoprolol tartrate  25 mg Oral BID   urea  30 g Oral BID   Continuous Infusions:   PRN Meds:.acetaminophen, metoprolol tartrate, traMADol, traZODone  Antibiotics  :    Anti-infectives (From admission, onward)    None       Time Spent in  minutes  30   Susa Raring M.D on 03/27/2021 at 11:12 AM  To page go to [email or web address]   [facility] -  Office  [phone]  See all Orders from today for further details    Objective:   Vitals:   03/26/21 0759 03/26/21 1700 03/26/21 1947 03/27/21 0454  BP: 127/72 130/73 93/61 125/70  Pulse: 100 96 88 98  Resp:  18 14 14   Temp: 97.8 F (36.6 C) (!) 97.5 F (36.4 C) 97.6 F (36.4 C) 98.9 F (37.2 C)  TempSrc: Oral Oral Oral Oral  SpO2: 98% 96% 99% 98%  Weight:    88.2 kg  Height:        Wt Readings from Last 3 Encounters:  03/27/21 88.2 kg  02/15/21 88.2 kg  05/16/20 87.9 kg     Intake/Output Summary (Last 24 hours) at 03/27/2021 1112 Last data filed at 03/27/2021 0949 Gross per 24 hour  Intake 991.74 ml  Output 575 ml  Net 416.74 ml     Physical Exam  Awake Alert, No new F.N deficits, Normal affect [place].AT,PERRAL Supple Neck, No JVD,   Symmetrical Chest wall movement, Good air movement bilaterally, CTAB RRR,No Gallops, Rubs or new Murmurs,  +ve B.Sounds, Abd Soft, No tenderness,   No Cyanosis, Clubbing or edema     Data Review:    CBC Recent Labs  Lab 03/24/21 1404 03/25/21 0308 03/26/21 0114 03/27/21 0337  WBC 11.1* 7.6 7.5 7.7  HGB 13.3 10.9* 10.1* 10.0*  HCT 35.8* 29.9* 27.7* 28.6*  PLT 225 184 189 207  MCV 91.6 92.0 93.3 95.7  MCH 34.0 33.5 34.0 33.4  MCHC 37.2* 36.5* 36.5*  35.0  RDW 13.2 13.2 13.6 13.7  LYMPHSABS  --   --  0.5* 0.8  MONOABS  --   --  0.8 1.0  EOSABS  --   --  0.0 0.1  BASOSABS  --   --  0.0 0.0    Recent Labs  Lab 03/24/21 1700 03/24/21 2227 03/25/21 0308 03/25/21 0853 03/25/21 2009 03/26/21 0114 03/26/21 1014 03/26/21 1631 03/27/21 0337 03/27/21 0837  NA 119*   < > 119*   < > 122* 122*  122* 125* 124* 123* 125*  K 3.1*   < > 3.1*   < > 4.0 3.9  3.9 4.0 4.0 3.7  --   CL 82*   < > 85*   < > 90* 93*  92* 94* 91* 93*  --   CO2 22   < > 23   < > 24 22  22 23 25 23   --   GLUCOSE 112*   < >  101*   < > 133* 112*  112* 138* 112* 106*  --   BUN 16   < > 15   < > 14 13  12 11 12 11   --   CREATININE 1.07   < > 1.02   < > 1.13 1.01  1.04 1.10 1.22 1.00  --   CALCIUM 9.3   < > 8.6*   < > 8.7* 8.4*  8.4* 8.6* 8.6* 8.4*  --   AST 65*  --   --   --   --  34  --   --  24  --   ALT 32  --   --   --   --  24  --   --  25  --   ALKPHOS 52  --   --   --   --  45  --   --  43  --   BILITOT 3.3*  --   --   --   --  1.7*  --   --  1.6*  --   ALBUMIN 3.7  --   --   --   --  2.9*  --  3.2* 2.7*  --   MG 1.6*  --  1.9  --   --  1.9  --   --  1.8  --   INR 1.3*  --   --   --   --   --   --   --   --   --   TSH  --   --  1.160  --   --   --   --   --   --   --   BNP  --   --   --   --   --  218.4*  --   --  519.8*  --    < > = values in this interval not displayed.    ------------------------------------------------------------------------------------------------------------------ No results for input(s): CHOL, HDL, LDLCALC, TRIG, CHOLHDL, LDLDIRECT in the last 72 hours.  No results found for: HGBA1C ------------------------------------------------------------------------------------------------------------------ Recent Labs    03/25/21 0308  TSH 1.160    Cardiac Enzymes No results for input(s): CKMB, TROPONINI, MYOGLOBIN in the last 168 hours.  Invalid input(s): CK ------------------------------------------------------------------------------------------------------------------    Component Value Date/Time   BNP 519.8 (H) 03/27/2021 0337     Radiology Reports DG Chest 2 View  Result Date: 03/24/2021 CLINICAL DATA:  Weakness. EXAM: CHEST - 2 VIEW COMPARISON:  Radiographs 05/26/2019.  CT  11/06/2005. FINDINGS: The heart size and mediastinal contours are stable with aortic atherosclerosis. There is mild left basilar atelectasis. The lungs otherwise appear clear, without confluent airspace opacity, edema or pleural effusion. No acute fractures are seen. There are mild degenerative  changes in the spine. IMPRESSION: No evidence of active cardiopulmonary process. Electronically Signed   By: Richardean Sale M.D.   On: 03/24/2021 15:33   DG Shoulder Right  Result Date: 03/24/2021 CLINICAL DATA:  Right shoulder pain after fall. EXAM: RIGHT SHOULDER - 2+ VIEW COMPARISON:  Radiograph 04/05/2020 FINDINGS: There is no evidence of fracture or dislocation. Moderate acromioclavicular degenerative change. Small subacromial spur. There is soft tissue calcifications adjacent to the lateral humeral head, not seen on prior exam. No erosion or bone destruction. IMPRESSION: 1. No fracture or dislocation of the right shoulder. 2. Soft tissue calcifications adjacent to the lateral humeral head, new from prior exam, may represent calcific tendinopathy or bursitis. Electronically Signed   By: Keith Rake M.D.   On: 03/24/2021 21:50   CT HEAD WO CONTRAST (5MM)  Result Date: 03/24/2021 CLINICAL DATA:  Generalized weakness that started 1 week ago. Multiple falls. EXAM: CT HEAD WITHOUT CONTRAST CT CERVICAL SPINE WITHOUT CONTRAST TECHNIQUE: Multidetector CT imaging of the head and cervical spine was performed following the standard protocol without intravenous contrast. Multiplanar CT image reconstructions of the cervical spine were also generated. COMPARISON:  None. FINDINGS: CT HEAD FINDINGS Brain: There is no evidence of acute intracranial hemorrhage, mass lesion, brain edema or extra-axial fluid collection. Mild atrophy with prominence of the ventricles and subarachnoid spaces. The dilatation of the lateral and 3rd ventricles is in slight excess of the sulcal prominence, but there is no definite hydrocephalus. Mild patchy low-density in the periventricular white matter likely reflects chronic small vessel ischemic changes. There is no CT evidence of acute cortical infarction. Vascular: Intracranial vascular calcifications. No hyperdense vessel identified. Skull: Negative for fracture or focal lesion.  Sinuses/Orbits: The visualized paranasal sinuses and mastoid air cells are clear. No orbital abnormalities are seen. Other: None. CT CERVICAL SPINE FINDINGS Alignment: Normal. Skull base and vertebrae: No evidence of acute cervical spine fracture or traumatic subluxation. Soft tissues and spinal canal: No prevertebral fluid or swelling. No visible canal hematoma. There is synovial calcifications surrounding the odontoid process without osseous erosion or mass effect on the craniocervical junction. Disc levels: Multilevel cervical spondylosis with disc bulging, uncinate spurring and facet hypertrophy. There is mild to moderate resulting osseous foraminal narrowing which appears worst at the C4-5 and C6-7 levels. Upper chest: Emphysematous changes and mild scarring at both lung apices. Severe bilateral carotid atherosclerosis. Other: None. IMPRESSION: 1. No acute intracranial or calvarial findings. 2. Atrophy and mild chronic small vessel ischemic changes. 3. No evidence of acute cervical spine fracture, traumatic subluxation or static signs of instability. 4. Multilevel cervical spondylosis. Electronically Signed   By: Richardean Sale M.D.   On: 03/24/2021 15:59   CT Cervical Spine Wo Contrast  Result Date: 03/24/2021 CLINICAL DATA:  Generalized weakness that started 1 week ago. Multiple falls. EXAM: CT HEAD WITHOUT CONTRAST CT CERVICAL SPINE WITHOUT CONTRAST TECHNIQUE: Multidetector CT imaging of the head and cervical spine was performed following the standard protocol without intravenous contrast. Multiplanar CT image reconstructions of the cervical spine were also generated. COMPARISON:  None. FINDINGS: CT HEAD FINDINGS Brain: There is no evidence of acute intracranial hemorrhage, mass lesion, brain edema or extra-axial fluid collection. Mild atrophy with prominence of the ventricles and subarachnoid spaces. The  dilatation of the lateral and 3rd ventricles is in slight excess of the sulcal prominence, but  there is no definite hydrocephalus. Mild patchy low-density in the periventricular white matter likely reflects chronic small vessel ischemic changes. There is no CT evidence of acute cortical infarction. Vascular: Intracranial vascular calcifications. No hyperdense vessel identified. Skull: Negative for fracture or focal lesion. Sinuses/Orbits: The visualized paranasal sinuses and mastoid air cells are clear. No orbital abnormalities are seen. Other: None. CT CERVICAL SPINE FINDINGS Alignment: Normal. Skull base and vertebrae: No evidence of acute cervical spine fracture or traumatic subluxation. Soft tissues and spinal canal: No prevertebral fluid or swelling. No visible canal hematoma. There is synovial calcifications surrounding the odontoid process without osseous erosion or mass effect on the craniocervical junction. Disc levels: Multilevel cervical spondylosis with disc bulging, uncinate spurring and facet hypertrophy. There is mild to moderate resulting osseous foraminal narrowing which appears worst at the C4-5 and C6-7 levels. Upper chest: Emphysematous changes and mild scarring at both lung apices. Severe bilateral carotid atherosclerosis. Other: None. IMPRESSION: 1. No acute intracranial or calvarial findings. 2. Atrophy and mild chronic small vessel ischemic changes. 3. No evidence of acute cervical spine fracture, traumatic subluxation or static signs of instability. 4. Multilevel cervical spondylosis. Electronically Signed   By: Richardean Sale M.D.   On: 03/24/2021 15:59   DG Hip Unilat W or Wo Pelvis 2-3 Views Left  Result Date: 03/24/2021 CLINICAL DATA:  Weakness. EXAM: DG HIP (WITH OR WITHOUT PELVIS) 2-3V LEFT COMPARISON:  Abdominal radiographs 08/02/2016. Abdominopelvic CT 03/09/2013. FINDINGS: The mineralization and alignment are normal. There are advanced degenerative changes of both hips which have progressed compared with the prior studies. There are associated osteophytes, subchondral  cysts and subchondral sclerosis. There is a nondisplaced fracture of the left greater trochanter. No definite extension of this fracture cross the femoral neck or into the intertrochanteric region is identified. There are diffuse vascular calcifications status post aorto bi-iliac stent grafting. Patient is status post ventral hernia repair. IMPRESSION: 1. Acute fracture of the left greater trochanter. If the patient is unable to bear weight or there is additional clinical concern for intertrochanteric extension of this fracture, consider further evaluation with CT. 2. Advanced osteoarthritic changes of both hips. Electronically Signed   By: Richardean Sale M.D.   On: 03/24/2021 15:37

## 2021-03-27 NOTE — Progress Notes (Signed)
Physical Therapy Treatment Patient Details Name: Eugene [name] MRN: [id number] DOB: 1935/11/08 Today's Date: 03/27/2021   History of Present Illness Pt is an 85 y.o. male admitted on 03/24/21 with c/o general weakness, multiple falls, right shoulder pain, and acute left hip pain. Chest x-ray and CT of head and neck negative. Lab showed hyponatermia. X-ray of left hip showed acute fx (non-operative management, WBAT). X-ray of right shoulder showed no fx. PMH includes AAA s/p stent graft, aortic stenosis, HDL, HTN, TIA, permanent a-fib.    PT Comments    Pt initially reluctant to get out of bed but when found that Primofit had not worked and his bed was saturated pt agreeable to transfer to recliner so that bed could be changed and he could be bathed. Pt is limited in safe mobility by L hip and R shoulder pain making mobility very painful. Pt requires increased time and cuing but is ultimately mod A for bed mobility and min A for lateral scooting transfer to drop arm recliner on his R. PT continues to recommend SNF level rehab at discharge however pt is refusing in which case he will likely need a wheelchair for safe mobility in his home. PT will conitnue to follow acutely.    Recommendations for follow up therapy are one component of a multi-disciplinary discharge planning process, led by the attending physician.  Recommendations may be updated based on patient status, additional functional criteria and insurance authorization.  Follow Up Recommendations  Home health PT (discussed SNF, pt refused)     Assistance Recommended at Discharge Frequent or constant Supervision/Assistance  Equipment Recommendations  Rolling walker (2 wheels);Wheelchair (measurements PT);Wheelchair cushion (measurements PT) (if unable to progress mobility pt may benefit from W/c initially)       Precautions / Restrictions Precautions Precautions: Fall Restrictions Weight Bearing Restrictions: Yes LLE Weight  Bearing: Weight bearing as tolerated     Mobility  Bed Mobility Overal bed mobility: Needs Assistance Bed Mobility: Supine to Sit;Sit to Supine     Supine to sit: Mod assist     General bed mobility comments: increased time and effort, vc for hand placement and use of R leg to offweight L hip for movement and reaching with L hand to bed rail with multiple scoots and    Transfers Overall transfer level: Needs assistance Equipment used: 1 person hand held assist Transfers: Lateral/Scoot Transfers            Lateral/Scoot Transfers: Min assist General transfer comment: min A for pad scoot of hips with maximal vc for hand placment for scooting towards his R vc for forward lean to offweight hips and pushing with L UE to move to the R    Ambulation/Gait             General Gait Details: did not attempt today due to pain          Balance Overall balance assessment: Needs assistance Sitting-balance support: Feet supported;Single extremity supported Sitting balance-Leahy Scale: Fair   Postural control: Left lateral lean                                  Cognition Arousal/Alertness: Awake/alert Behavior During Therapy: WFL for tasks assessed/performed Overall Cognitive Status: Within Functional Limits for tasks assessed  General Comments: pt sitting in wet bed reporting that nursing told him that he could just pee because he had a Primofit on, unfortunately it did not work, pt did not seem to notice the increased wetness           General Comments General comments (skin integrity, edema, etc.): Daughter and wife present and helpful with encouragement, Nursing notified for providing pt with bath      Pertinent Vitals/Pain Pain Assessment: Faces Faces Pain Scale: Hurts whole lot Pain Location: L hip, R shoulder, back Pain Descriptors / Indicators: Tiring;Sore;Grimacing Pain Intervention(s): Limited  activity within patient's tolerance;Monitored during session;Repositioned     PT Goals (current goals can now be found in the care plan section) Acute Rehab PT Goals Patient Stated Goal: Pt wants to return home PT Goal Formulation: With patient Time For Goal Achievement: 04/08/21 Potential to Achieve Goals: Fair Progress towards PT goals: Progressing toward goals    Frequency    Min 5X/week      PT Plan Current plan remains appropriate       AM-PAC PT "6 Clicks" Mobility   Outcome Measure  Help needed turning from your back to your side while in a flat bed without using bedrails?: A Little Help needed moving from lying on your back to sitting on the side of a flat bed without using bedrails?: A Lot Help needed moving to and from a bed to a chair (including a wheelchair)?: A Little Help needed standing up from a chair using your arms (e.g., wheelchair or bedside chair)?: A Little Help needed to walk in hospital room?: A Little Help needed climbing 3-5 steps with a railing? : A Lot 6 Click Score: 16    End of Session Equipment Utilized During Treatment: Gait belt Activity Tolerance: Patient limited by pain;Patient limited by fatigue Patient left: in chair;with call bell/phone within reach;with chair alarm set Nurse Communication: Mobility status PT Visit Diagnosis: Unsteadiness on feet (R26.81);Other abnormalities of gait and mobility (R26.89);Repeated falls (R29.6);Muscle weakness (generalized) (M62.81);History of falling (Z91.81);Pain     Time: 3662-9476 PT Time Calculation (min) (ACUTE ONLY): 30 min  Charges:  $Therapeutic Activity: 23-37 mins                     Jack Mineau B. Beverely Risen PT, DPT Acute Rehabilitation Services Pager [phone] Office [phone]    [name] 03/27/2021, 5:34 PM

## 2021-03-27 NOTE — Progress Notes (Signed)
Occupational Therapy Treatment Patient Details Name: Eugene [name] MRN: [id number] DOB: 04/05/1936 Today's Date: 03/27/2021   History of present illness Pt is an 85 y.o. male admitted on 03/24/21 with c/o general weakness, multiple falls, right shoulder pain, and acute left hip pain. Chest x-ray and CT of head and neck negative. Lab showed hyponatermia. X-ray of left hip showed acute fx (non-operative management, WBAT). X-ray of right shoulder showed no fx. PMH includes AAA s/p stent graft, aortic stenosis, HDL, HTN, TIA, permanent a-fib.   OT comments  Patient continues to make progress towards goals in skilled OT session. Patient's session encompassed bed mobility, ADLs, and attempts to increase sitting and standing balance. Pt with increased pain in session, but did not specify numerical value. Pt requiring mod A for bed mobility, and mod A to attempt to stand with RW. Due to balance deficits and poor sitting tolerance, therapist recommending increased assist in order to come into standing. Patient unable to transition to chair during session, and required rolling for peri care in bed. Therapy will continue to follow per recommendations.    Recommendations for follow up therapy are one component of a multi-disciplinary discharge planning process, led by the attending physician.  Recommendations may be updated based on patient status, additional functional criteria and insurance authorization.    Follow Up Recommendations  No OT follow up    Assistance Recommended at Discharge Intermittent Supervision/Assistance  Equipment Recommendations  None recommended by OT    Recommendations for Other Services      Precautions / Restrictions Precautions Precautions: Fall Restrictions Weight Bearing Restrictions: Yes LLE Weight Bearing: Weight bearing as tolerated       Mobility Bed Mobility Overal bed mobility: Needs Assistance Bed Mobility: Supine to Sit;Sit to Supine     Supine to  sit: Mod assist Sit to supine: Mod assist   General bed mobility comments: Increased time with HOB elevated, requiring assist for BLEs to transition on and off bed, poor activity tolerance throughout    Transfers Overall transfer level: Needs assistance Equipment used: Rolling walker (2 wheels) Transfers: Sit to/from Stand Sit to Stand: Mod assist           General transfer comment: Mod A to come into standing, noted increased weakness as patient unable to flex hips to come fully into standing, despite further attempts only able to come to standing x1     Balance Overall balance assessment: Needs assistance Sitting-balance support: Feet supported;Single extremity supported Sitting balance-Leahy Scale: Poor Sitting balance - Comments: Pt leaning to L throughout session, requiring assist to fix, propped on LUE throughout to maintain balance, kyphotic posture with increased effort to fully sit upright noted Postural control: Left lateral lean Standing balance support: Reliant on assistive device for balance;Bilateral upper extremity supported;Single extremity supported Standing balance-Leahy Scale: Poor Standing balance comment: Unable to fully come to standing, dependent on RW for balance and mod A from therapist                           ADL either performed or assessed with clinical judgement   ADL Overall ADL's : Needs assistance/impaired     Grooming: Wash/dry hands;Wash/dry face;Set up;Sitting               Lower Body Dressing: Moderate assistance;Sit to/from stand Lower Body Dressing Details (indicate cue type and reason): poor sitting balance, increased pain at hip and shoulder limiting mobility  Functional mobility during ADLs: Moderate assistance General ADL Comments: mod A for lower body ADLs, limited by pain and decreased sitting balance and activity tolerance     Vision       Perception     Praxis      Cognition  Arousal/Alertness: Awake/alert Behavior During Therapy: WFL for tasks assessed/performed Overall Cognitive Status: No family/caregiver present to determine baseline cognitive functioning                                            Exercises     Shoulder Instructions       General Comments      Pertinent Vitals/ Pain       Pain Assessment: Faces Faces Pain Scale: Hurts even more Pain Location: L hip, R shoulder, back Pain Descriptors / Indicators: Tiring;Sore;Grimacing Pain Intervention(s): Limited activity within patient's tolerance;Repositioned;Monitored during session  Home Living                                          Prior Functioning/Environment              Frequency  Min 2X/week        Progress Toward Goals  OT Goals(current goals can now be found in the care plan section)  Progress towards OT goals: Progressing toward goals  Acute Rehab OT Goals Patient Stated Goal: Be in less pain OT Goal Formulation: With patient Time For Goal Achievement: 04/08/21 Potential to Achieve Goals: [place] Discharge plan remains appropriate    Co-evaluation                 AM-PAC OT "6 Clicks" Daily Activity     Outcome Measure   Help from another person eating meals?: None Help from another person taking care of personal grooming?: None Help from another person toileting, which includes using toliet, bedpan, or urinal?: A Lot Help from another person bathing (including washing, rinsing, drying)?: A Lot Help from another person to put on and taking off regular upper body clothing?: A Little Help from another person to put on and taking off regular lower body clothing?: A Lot 6 Click Score: 17    End of Session Equipment Utilized During Treatment: Rolling walker (2 wheels);Gait belt  OT Visit Diagnosis: Unsteadiness on feet (R26.81);Other abnormalities of gait and mobility (R26.89);Muscle weakness (generalized)  (M62.81);History of falling (Z91.81);Pain Pain - Right/Left: Right Pain - part of body: Shoulder;Hip   Activity Tolerance Patient limited by fatigue;Patient limited by pain   Patient Left in bed;with call bell/phone within reach;with bed alarm set   Nurse Communication Mobility status        Time: [id number] OT Time Calculation (min): 34 min  Charges: OT General Charges $OT Visit: 1 Visit OT Treatments $Self Care/Home Management : 23-37 mins  [place]. [place], [place] [facility] [place] 03/27/2021, 11:59 AM

## 2021-03-27 NOTE — Discharge Instructions (Signed)

## 2021-03-27 NOTE — TOC CAGE-AID Note (Signed)
Transition of Care [facility]) - CAGE-AID Screening   Patient Details  Name: Eugene [name] MRN: [id number] Date of Birth: 1935-09-27  Transition of Care [facility] CM/SW Contact:    Savva Beamer C Tarpley-Carter, LCSWA Phone Number: 03/27/2021, 1:45 PM   Clinical Narrative: Pt participated in Cage-Aid.  Pt stated he does not use substance or ETOH (socially).  Pt has a history of smoking cigarettes.  Pt was not offered resources, due to no usage of substance or ETOH.     Honore Wipperfurth Tarpley-Carter, MSW, LCSW-A Pronouns:  She/Her/Hers [facility]itions of Care Clinical Social Worker Direct Number:  [phone] [email or web address]    CAGE-AID Screening:    Have You Ever Felt You Ought to Cut Down on Your Drinking or Drug Use?: No Have People Annoyed You By [employer] Your Drinking Or Drug Use?: No Have You Felt Bad Or Guilty About Your Drinking Or Drug Use?: No Have You Ever Had a Drink or Used Drugs First Thing In The Morning to Steady Your Nerves or to Get Rid of a Hangover?: No CAGE-AID Score: 0  Substance Abuse Education Offered: No

## 2021-03-27 NOTE — Progress Notes (Addendum)
[name] Progress Note   Subjective:   Patient seems slightly confused today.  Poor appetite.  Otherwise feels about the same.  Is having some hip pain.  Stable 123  Objective Vitals:   03/26/21 1700 03/26/21 1947 03/27/21 0454 03/27/21 1118  BP: 130/73 93/61 125/70 115/72  Pulse: 96 88 98 (!) 103  Resp: 18 14 14 19   Temp: (!) 97.5 F (36.4 C) 97.6 F (36.4 C) 98.9 F (37.2 C) 98.2 F (36.8 C)  TempSrc: Oral Oral Oral Oral  SpO2: 96% 99% 98% 97%  Weight:   88.2 kg   Height:       Physical Exam Gen: elderly man sitting in bed in no distress Eyes:  anicteric, EOMI, glasses ENT: MMM Neck: supple, no JVD CV:  HR in low 100s Lungs: Bilateral chest rise with no increased work of breathing GU: no foley Extr:  no edema Neuro: grossly nonfocal but unclear on some details  Additional Objective Labs: Basic Metabolic Panel: Recent Labs  Lab 03/26/21 1014 03/26/21 1631 03/27/21 0337 03/27/21 0837  NA 125* 124* 123* 125*  K 4.0 4.0 3.7  --   CL 94* 91* 93*  --   CO2 23 25 23   --   GLUCOSE 138* 112* 106*  --   BUN 11 12 11   --   CREATININE 1.10 1.22 1.00  --   CALCIUM 8.6* 8.6* 8.4*  --   PHOS  --  2.5  --   --    Liver Function Tests: Recent Labs  Lab 03/24/21 1700 03/26/21 0114 03/26/21 1631 03/27/21 0337  AST 65* 34  --  24  ALT 32 24  --  25  ALKPHOS 52 45  --  43  BILITOT 3.3* 1.7*  --  1.6*  PROT 6.3* 5.2*  --  5.2*  ALBUMIN 3.7 2.9* 3.2* 2.7*   No results for input(s): LIPASE, AMYLASE in the last 168 hours. CBC: Recent Labs  Lab 03/24/21 1404 03/25/21 0308 03/26/21 0114 03/27/21 0337  WBC 11.1* 7.6 7.5 7.7  NEUTROABS  --   --  6.2 5.7  HGB 13.3 10.9* 10.1* 10.0*  HCT 35.8* 29.9* 27.7* 28.6*  MCV 91.6 92.0 93.3 95.7  PLT 225 184 189 207   Blood Culture No results found for: SDES, SPECREQUEST, CULT, REPTSTATUS  Cardiac Enzymes: No results for input(s): CKTOTAL, CKMB, CKMBINDEX, TROPONINI in the last 168  hours. CBG: Recent Labs  Lab 03/24/21 1731  GLUCAP 120*   Iron Studies: No results for input(s): IRON, TIBC, TRANSFERRIN, FERRITIN in the last 72 hours. @lablastinr3 @ Studies/Results: No results found. Medications:   ALPRAZolam  0.5 mg Oral BID   apixaban  5 mg Oral BID   atorvastatin  20 mg Oral Daily   metoprolol tartrate  25 mg Oral BID   urea  30 g Oral BID    Assessment/Plan **Hyponatremia: Thought to initially be hypovolemic hyponatremia given dehydration and thiazide use but with correction of volume status Eugene improved but now plateaued.  Urine Eugene 47 and urine osmolality 358 off of fluids and diuretics suggesting that ADH is overactive.  No obvious signs of significant dehydration on my exam.  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  We will start urea today at 30g BID and monitor Na closely.  Continue fluid restriction 1.5 L daily. No IV fluids for now. Na goal of 131 is reasonable given we reached 125 yesterday.   **Hypokalemia: Resolved with  supplementation  **HTN: on HCTZ, ramipril and metoprolol at home.  BB on here for A fib but others held.  BP on low side.    **Hip fracture:  nonoperative management   Will follow closely, call with issues.

## 2021-03-28 LAB — COMPREHENSIVE METABOLIC PANEL
ALT: 36 U/L (ref 0–44)
AST: 32 U/L (ref 15–41)
Albumin: 2.9 g/dL — ABNORMAL LOW (ref 3.5–5.0)
Alkaline Phosphatase: 47 U/L (ref 38–126)
Anion gap: 9 (ref 5–15)
BUN: 49 mg/dL — ABNORMAL HIGH (ref 8–23)
CO2: 23 mmol/L (ref 22–32)
Calcium: 9 mg/dL (ref 8.9–10.3)
Chloride: 93 mmol/L — ABNORMAL LOW (ref 98–111)
Creatinine, Ser: 1.02 mg/dL (ref 0.61–1.24)
GFR, Estimated: >60 mL/min (ref >60)
Glucose, Bld: 109 mg/dL — ABNORMAL HIGH (ref 70–99)
Potassium: 3.7 mmol/L (ref 3.5–5.1)
Sodium: 125 mmol/L — ABNORMAL LOW (ref 135–145)
Total Bilirubin: 1.7 mg/dL — ABNORMAL HIGH (ref 0.3–1.2)
Total Protein: 5.8 g/dL — ABNORMAL LOW (ref 6.5–8.1)

## 2021-03-28 LAB — SODIUM
Sodium: 127 mmol/L — ABNORMAL LOW (ref 135–145)
Sodium: 126 mmol/L — ABNORMAL LOW (ref 135–145)
Sodium: 128 mmol/L — ABNORMAL LOW (ref 135–145)

## 2021-03-28 LAB — CBC WITH DIFFERENTIAL/PLATELET
Abs Immature Granulocytes: 0.09 10*3/uL — ABNORMAL HIGH (ref 0.00–0.07)
Basophils Absolute: 0 10*3/uL (ref 0.0–0.1)
Basophils Relative: 0 %
Eosinophils Absolute: 0.1 10*3/uL (ref 0.0–0.5)
Eosinophils Relative: 1 %
HCT: 29.2 % — ABNORMAL LOW (ref 39.0–52.0)
Hemoglobin: 10.6 g/dL — ABNORMAL LOW (ref 13.0–17.0)
Immature Granulocytes: 1 %
Lymphocytes Relative: 10 %
Lymphs Abs: 0.9 10*3/uL (ref 0.7–4.0)
MCH: 34.2 pg — ABNORMAL HIGH (ref 26.0–34.0)
MCHC: 36.3 g/dL — ABNORMAL HIGH (ref 30.0–36.0)
MCV: 94.2 fL (ref 80.0–100.0)
Monocytes Absolute: 1.1 10*3/uL — ABNORMAL HIGH (ref 0.1–1.0)
Monocytes Relative: 12 %
Neutro Abs: 7 10*3/uL (ref 1.7–7.7)
Neutrophils Relative %: 76 %
Platelets: 253 10*3/uL (ref 150–400)
RBC: 3.1 MIL/uL — ABNORMAL LOW (ref 4.22–5.81)
RDW: 13.7 % (ref 11.5–15.5)
WBC: 9.1 10*3/uL (ref 4.0–10.5)
nRBC: 0 % (ref 0.0–0.2)

## 2021-03-28 LAB — BRAIN NATRIURETIC PEPTIDE: B Natriuretic Peptide: 621.6 pg/mL — ABNORMAL HIGH (ref 0.0–100.0)

## 2021-03-28 LAB — MAGNESIUM: Magnesium: 1.9 mg/dL (ref 1.7–2.4)

## 2021-03-28 MED ORDER — UREA 15 G PO PACK
30.0000 g | PACK | Freq: Three times a day (TID) | ORAL | Status: DC
  Administered 2021-03-28 – 2021-03-30 (×5): 30 g via ORAL
  Filled 2021-03-28 (×6): qty 2

## 2021-03-28 MED ORDER — HYDROCODONE-ACETAMINOPHEN 5-325 MG PO TABS
1.0000 | ORAL_TABLET | Freq: Four times a day (QID) | ORAL | Status: DC | PRN
  Administered 2021-03-28 – 2021-04-02 (×12): 1 via ORAL
  Filled 2021-03-28 (×13): qty 1

## 2021-03-28 MED ORDER — DIGOXIN 0.25 MG/ML IJ SOLN
0.2500 mg | Freq: Once | INTRAMUSCULAR | Status: AC
  Administered 2021-03-28: 0.25 mg via INTRAVENOUS
  Filled 2021-03-28: qty 2

## 2021-03-28 MED ORDER — TOLVAPTAN 15 MG PO TABS
15.0000 mg | ORAL_TABLET | Freq: Once | ORAL | Status: AC
  Administered 2021-03-28: 15 mg via ORAL
  Filled 2021-03-28: qty 1

## 2021-03-28 MED ORDER — POTASSIUM CHLORIDE CRYS ER 20 MEQ PO TBCR
20.0000 meq | EXTENDED_RELEASE_TABLET | Freq: Once | ORAL | Status: AC
  Administered 2021-03-28: 20 meq via ORAL
  Filled 2021-03-28: qty 1

## 2021-03-28 MED ORDER — UREA 15 G PO PACK
30.0000 g | PACK | Freq: Two times a day (BID) | ORAL | Status: DC
  Administered 2021-03-28: 30 g via ORAL
  Filled 2021-03-28 (×2): qty 2

## 2021-03-28 NOTE — Plan of Care (Signed)

## 2021-03-28 NOTE — Progress Notes (Signed)
Mobility Specialist: Progress Note   03/28/21 1505  Mobility  Activity Transferred:  Bed to chair  Level of Assistance Maximum assist, patient does 25-49%  Assistive Device  (HHA)  Distance Ambulated (ft) 2 ft  Mobility Out of bed to chair with meals  Mobility Response Tolerated fair  Mobility performed by Mobility specialist  Bed Position Chair  $Mobility charge 1 Mobility   Pt assisted to the chair per request. Pt unable to stand at the RW so had pt put his arms around my shoulders to assist with stand. Pt required verbal cues for hand and foot placement throughout transfer. Pt has call bell and phone at his side with family member present in the room.   [facility] Rikia Sukhu Mobility Specialist Mobility Specialist Phone: [phone]

## 2021-03-28 NOTE — Social Work (Signed)
CSW attempted to contact pt son, no answer CSW left a VM.

## 2021-03-28 NOTE — Progress Notes (Signed)
PROGRESS NOTE                                                                                                                                                                                                             Patient Demographics:    Eugene [name], is a 85 y.o. male, DOB - [birth year], OIZ:[id number]  Outpatient Primary MD for the patient is [name], MD    LOS - 4  Admit date - 03/24/2021    Chief Complaint  Patient presents with   Weakness       Brief Narrative (HPI from H&P)    Eugene [name] is a 85 y.o. male with medical history significant of permanent A. fib on Eliquis, mild aortic stenosis/regurgitation, hypertension, hyperlipidemia, CVA/TIA, IBS, hemochromatosis followed by Dr. Darnelle Catalan, GERD, AAA status post repair presented to the ED complaining of generalized weakness, multiple falls, and acute onset left hip pain.  He presented to the ER with generalized weakness and ongoing left hip pain was diagnosed with severe dehydration, hypokalemia, hypomagnesemia, left greater trochanteric fracture, renal and orthopedics were consulted and he was admitted to the hospital.   Subjective:   Patient in bed, appears comfortable, denies any headache, no fever, no chest pain or pressure, no shortness of breath , no abdominal pain. No new focal weakness, +ve R. Shoulder and L. Hip pain.   Assessment  & Plan :     Severe Hyponatremia, Hypotension, Dehydration - multiple falls - all likely from HCTZ and PPI, BP Meds held, PPI discontinued, potassium and magnesium have been replaced, renal is following and managing hyponatremia and dehydration.  2.  Multiple falls with left greater trochanteric fracture, hip pain.  Seen by Ortho medical management.  Commence PT OT once patient is able to, may require placement to SNF.  Also has right shoulder pain, right shoulder x-rays have been stable will get MRI, [facility]  03/27/21, wondering if he has sustained rotator cuff injury.  3. HTN.  Blood pressure is low, blood pressure medications held, post gentle hydration  4.  Hypokalemia and hypomagnesemia.  Replace and monitor.  5.  Permanent A. fib with [place] vas 2 score of 3.  Continue Eliquis with caution, beta-blocker if blood pressure tolerates, post 2 doses of digoxin on 03/25/21 and repeat 1 dose on 03/28/21 for short-term for rate control,  amiodarone if all fails.  6.  Dyslipidemia.  On statin.  7.  History of hemochromatosis.  Outpatient follow-up.  8.  Anxiety and depression.  Trazodone.      Condition - Fair  Family Communication  :    Eugene [name] [phone] 03/27/21  wife Eugene [name] [phone] - on 03/25/21 @ 9.21 - message left.  Code Status :  Full  Consults  :  Renal, Ortho  PUD Prophylaxis :    Procedures  :      CT - Head and C Spine - 1. No acute intracranial or calvarial findings. 2. Atrophy and mild chronic small vessel ischemic changes. 3. No evidence of acute cervical spine fracture, traumatic subluxation or static signs of instability. 4. Multilevel cervical spondylosis.      Disposition Plan  :    Status is: Inpatient  Remains inpatient appropriate because: Severe Hyponatremia, left hip fracture, right shoulder injury  DVT Prophylaxis  :    apixaban (ELIQUIS) tablet 5 mg    Lab Results  Component Value Date   PLT 253 03/28/2021    Diet :  Diet Order             Diet regular Room service appropriate? Yes; Fluid consistency: Thin  Diet effective now                    Inpatient Medications  Scheduled Meds:  ALPRAZolam  0.5 mg Oral BID   apixaban  5 mg Oral BID   atorvastatin  20 mg Oral Daily   digoxin  0.25 mg Intravenous Once   metoprolol tartrate  25 mg Oral BID   potassium chloride  20 mEq Oral Once   urea  30 g Oral BID   Continuous Infusions:   PRN Meds:.acetaminophen, metoprolol tartrate, traMADol, traZODone  Antibiotics  :     Anti-infectives (From admission, onward)    None       Time Spent in minutes  30   Susa Raring M.D on 03/28/2021 at 9:36 AM  To page go to [email or web address]   [facility] -  Office  [phone]  See all Orders from today for further details    Objective:   Vitals:   03/27/21 1118 03/27/21 2150 03/28/21 0511 03/28/21 0753  BP: 115/72 (!) 142/97 102/60 125/70  Pulse: (!) 103  94 95  Resp: 19  16   Temp: 98.2 F (36.8 C) (!) 97.5 F (36.4 C) 98.5 F (36.9 C) 98.6 F (37 C)  TempSrc: Oral Oral Oral Oral  SpO2: 97% 99% 98% 96%  Weight:   84.7 kg   Height:        Wt Readings from Last 3 Encounters:  03/28/21 84.7 kg  02/15/21 88.2 kg  05/16/20 87.9 kg     Intake/Output Summary (Last 24 hours) at 03/28/2021 0936 Last data filed at 03/28/2021 0500 Gross per 24 hour  Intake 810 ml  Output 1800 ml  Net -990 ml     Physical Exam  Awake Alert, No new F.N deficits, Normal affect [place].AT,PERRAL Supple Neck, No JVD,   Symmetrical Chest wall movement, Good air movement bilaterally, CTAB RRR,No Gallops, Rubs or new Murmurs,  +ve B.Sounds, Abd Soft, No tenderness,   No Cyanosis, Clubbing or edema    Data Review:    CBC Recent Labs  Lab 03/24/21 1404 03/25/21 0308 03/26/21 0114 03/27/21 0337 03/28/21 0350  WBC 11.1* 7.6 7.5 7.7 9.1  HGB 13.3 10.9* 10.1* 10.0* 10.6*  HCT 35.8*  29.9* 27.7* 28.6* 29.2*  PLT 225 184 189 207 253  MCV 91.6 92.0 93.3 95.7 94.2  MCH 34.0 33.5 34.0 33.4 34.2*  MCHC 37.2* 36.5* 36.5* 35.0 36.3*  RDW 13.2 13.2 13.6 13.7 13.7  LYMPHSABS  --   --  0.5* 0.8 0.9  MONOABS  --   --  0.8 1.0 1.1*  EOSABS  --   --  0.0 0.1 0.1  BASOSABS  --   --  0.0 0.0 0.0    Recent Labs  Lab 03/24/21 1700 03/24/21 2227 03/25/21 0308 03/25/21 0853 03/26/21 0114 03/26/21 1014 03/26/21 1631 03/27/21 0337 03/27/21 0837 03/27/21 1435 03/27/21 2048 03/28/21 0350 03/28/21 0831  NA 119*   < > 119*   < > 122*  122* 125* 124* 123* 125*  126* 124* 125* 127*  K 3.1*   < > 3.1*   < > 3.9  3.9 4.0 4.0 3.7  --   --   --  3.7  --   CL 82*   < > 85*   < > 93*  92* 94* 91* 93*  --   --   --  93*  --   CO2 22   < > 23   < > 22  22 23 25 23   --   --   --  23  --   GLUCOSE 112*   < > 101*   < > 112*  112* 138* 112* 106*  --   --   --  109*  --   BUN 16   < > 15   < > 13  12 11 12 11   --   --   --  49*  --   CREATININE 1.07   < > 1.02   < > 1.01  1.04 1.10 1.22 1.00  --   --   --  1.02  --   CALCIUM 9.3   < > 8.6*   < > 8.4*  8.4* 8.6* 8.6* 8.4*  --   --   --  9.0  --   AST 65*  --   --   --  34  --   --  24  --   --   --  32  --   ALT 32  --   --   --  24  --   --  25  --   --   --  36  --   ALKPHOS 52  --   --   --  45  --   --  43  --   --   --  47  --   BILITOT 3.3*  --   --   --  1.7*  --   --  1.6*  --   --   --  1.7*  --   ALBUMIN 3.7  --   --   --  2.9*  --  3.2* 2.7*  --   --   --  2.9*  --   MG 1.6*  --  1.9  --  1.9  --   --  1.8  --   --   --  1.9  --   INR 1.3*  --   --   --   --   --   --   --   --   --   --   --   --   TSH  --   --  1.160  --   --   --   --   --   --   --   --   --   --  BNP  --   --   --   --  218.4*  --   --  519.8*  --   --   --  621.6*  --    < > = values in this interval not displayed.    ------------------------------------------------------------------------------------------------------------------ No results for input(s): CHOL, HDL, LDLCALC, TRIG, CHOLHDL, LDLDIRECT in the last 72 hours.  No results found for: HGBA1C ------------------------------------------------------------------------------------------------------------------ No results for input(s): TSH, T4TOTAL, T3FREE, THYROIDAB in the last 72 hours.  Invalid input(s): FREET3   Cardiac Enzymes No results for input(s): CKMB, TROPONINI, MYOGLOBIN in the last 168 hours.  Invalid input(s): CK ------------------------------------------------------------------------------------------------------------------    Component Value  Date/Time   BNP 621.6 (H) 03/28/2021 0350     Radiology Reports DG Chest 2 View  Result Date: 03/24/2021 CLINICAL DATA:  Weakness. EXAM: CHEST - 2 VIEW COMPARISON:  Radiographs 05/26/2019.  CT 11/06/2005. FINDINGS: The heart size and mediastinal contours are stable with aortic atherosclerosis. There is mild left basilar atelectasis. The lungs otherwise appear clear, without confluent airspace opacity, edema or pleural effusion. No acute fractures are seen. There are mild degenerative changes in the spine. IMPRESSION: No evidence of active cardiopulmonary process. Electronically Signed   By: Carey Bullocks M.D.   On: 03/24/2021 15:33   DG Shoulder Right  Result Date: 03/24/2021 CLINICAL DATA:  Right shoulder pain after fall. EXAM: RIGHT SHOULDER - 2+ VIEW COMPARISON:  Radiograph 04/05/2020 FINDINGS: There is no evidence of fracture or dislocation. Moderate acromioclavicular degenerative change. Small subacromial spur. There is soft tissue calcifications adjacent to the lateral humeral head, not seen on prior exam. No erosion or bone destruction. IMPRESSION: 1. No fracture or dislocation of the right shoulder. 2. Soft tissue calcifications adjacent to the lateral humeral head, new from prior exam, may represent calcific tendinopathy or bursitis. Electronically Signed   By: Narda Rutherford M.D.   On: 03/24/2021 21:50   CT HEAD WO CONTRAST ( )  Result Date: 03/24/2021 CLINICAL DATA:  Generalized weakness that started 1 week ago. Multiple falls. EXAM: CT HEAD WITHOUT CONTRAST CT CERVICAL SPINE WITHOUT CONTRAST TECHNIQUE: Multidetector CT imaging of the head and cervical spine was performed following the standard protocol without intravenous contrast. Multiplanar CT image reconstructions of the cervical spine were also generated. COMPARISON:  None. FINDINGS: CT HEAD FINDINGS Brain: There is no evidence of acute intracranial hemorrhage, mass lesion, brain edema or extra-axial fluid collection. Mild  atrophy with prominence of the ventricles and subarachnoid spaces. The dilatation of the lateral and 3rd ventricles is in slight excess of the sulcal prominence, but there is no definite hydrocephalus. Mild patchy low-density in the periventricular white matter likely reflects chronic small vessel ischemic changes. There is no CT evidence of acute cortical infarction. Vascular: Intracranial vascular calcifications. No hyperdense vessel identified. Skull: Negative for fracture or focal lesion. Sinuses/Orbits: The visualized paranasal sinuses and mastoid air cells are clear. No orbital abnormalities are seen. Other: None. CT CERVICAL SPINE FINDINGS Alignment: Normal. Skull base and vertebrae: No evidence of acute cervical spine fracture or traumatic subluxation. Soft tissues and spinal canal: No prevertebral fluid or swelling. No visible canal hematoma. There is synovial calcifications surrounding the odontoid process without osseous erosion or mass effect on the craniocervical junction. Disc levels: Multilevel cervical spondylosis with disc bulging, uncinate spurring and facet hypertrophy. There is mild to moderate resulting osseous foraminal narrowing which appears worst at the C4-5 and C6-7 levels. Upper chest: Emphysematous changes and mild scarring at both lung apices. Severe bilateral carotid  atherosclerosis. Other: None. IMPRESSION: 1. No acute intracranial or calvarial findings. 2. Atrophy and mild chronic small vessel ischemic changes. 3. No evidence of acute cervical spine fracture, traumatic subluxation or static signs of instability. 4. Multilevel cervical spondylosis. Electronically Signed   By: Carey Bullocks M.D.   On: 03/24/2021 15:59   CT Cervical Spine Wo Contrast  Result Date: 03/24/2021 CLINICAL DATA:  Generalized weakness that started 1 week ago. Multiple falls. EXAM: CT HEAD WITHOUT CONTRAST CT CERVICAL SPINE WITHOUT CONTRAST TECHNIQUE: Multidetector CT imaging of the head and cervical  spine was performed following the standard protocol without intravenous contrast. Multiplanar CT image reconstructions of the cervical spine were also generated. COMPARISON:  None. FINDINGS: CT HEAD FINDINGS Brain: There is no evidence of acute intracranial hemorrhage, mass lesion, brain edema or extra-axial fluid collection. Mild atrophy with prominence of the ventricles and subarachnoid spaces. The dilatation of the lateral and 3rd ventricles is in slight excess of the sulcal prominence, but there is no definite hydrocephalus. Mild patchy low-density in the periventricular white matter likely reflects chronic small vessel ischemic changes. There is no CT evidence of acute cortical infarction. Vascular: Intracranial vascular calcifications. No hyperdense vessel identified. Skull: Negative for fracture or focal lesion. Sinuses/Orbits: The visualized paranasal sinuses and mastoid air cells are clear. No orbital abnormalities are seen. Other: None. CT CERVICAL SPINE FINDINGS Alignment: Normal. Skull base and vertebrae: No evidence of acute cervical spine fracture or traumatic subluxation. Soft tissues and spinal canal: No prevertebral fluid or swelling. No visible canal hematoma. There is synovial calcifications surrounding the odontoid process without osseous erosion or mass effect on the craniocervical junction. Disc levels: Multilevel cervical spondylosis with disc bulging, uncinate spurring and facet hypertrophy. There is mild to moderate resulting osseous foraminal narrowing which appears worst at the C4-5 and C6-7 levels. Upper chest: Emphysematous changes and mild scarring at both lung apices. Severe bilateral carotid atherosclerosis. Other: None. IMPRESSION: 1. No acute intracranial or calvarial findings. 2. Atrophy and mild chronic small vessel ischemic changes. 3. No evidence of acute cervical spine fracture, traumatic subluxation or static signs of instability. 4. Multilevel cervical spondylosis.  Electronically Signed   By: Carey Bullocks M.D.   On: 03/24/2021 15:59   DG Hip Unilat W or Wo Pelvis 2-3 Views Left  Result Date: 03/24/2021 CLINICAL DATA:  Weakness. EXAM: DG HIP (WITH OR WITHOUT PELVIS) 2-3V LEFT COMPARISON:  Abdominal radiographs 08/02/2016. Abdominopelvic CT 03/09/2013. FINDINGS: The mineralization and alignment are normal. There are advanced degenerative changes of both hips which have progressed compared with the prior studies. There are associated osteophytes, subchondral cysts and subchondral sclerosis. There is a nondisplaced fracture of the left greater trochanter. No definite extension of this fracture cross the femoral neck or into the intertrochanteric region is identified. There are diffuse vascular calcifications status post aorto bi-iliac stent grafting. Patient is status post ventral hernia repair. IMPRESSION: 1. Acute fracture of the left greater trochanter. If the patient is unable to bear weight or there is additional clinical concern for intertrochanteric extension of this fracture, consider further evaluation with CT. 2. Advanced osteoarthritic changes of both hips. Electronically Signed   By: Carey Bullocks M.D.   On: 03/24/2021 15:37

## 2021-03-28 NOTE — Progress Notes (Signed)
Physical Therapy Treatment Patient Details Name: Eugene [name] MRN: [id number] DOB: [birth year] Today's Date: 03/28/2021   History of Present Illness Pt is an 85 y.o. male admitted on 03/24/21 with c/o general weakness, multiple falls, right shoulder pain, and acute left hip pain. Chest x-ray and CT of head and neck negative. Lab showed hyponatermia. X-ray of left hip showed acute fx (non-operative management, WBAT). X-ray of right shoulder showed no fx. PMH includes AAA s/p stent graft, aortic stenosis, HDL, HTN, TIA, permanent a-fib.    PT Comments    Pt with increased pain in R shoulder today, with encouragement of family pt agrees to try to get up and walk. Pt requires maxAx2 for coming to EoB. Once there requires min guard to modA for maintaining seated EoB. Pt with decreased core strength in conjunction with increased pain limiting pt ability for sitting EoB. Eventually, pt leans back in bed requiring total A to return to supine. Once in supine worked on breathing techniques for relaxation and pain relief. Pt and family now agreeable to SNF placement. Provided increased education on need for full participation in therapy at SNF working on goal of returning home. PT will continue to follow acutely.   Recommendations for follow up therapy are one component of a multi-disciplinary discharge planning process, led by the attending physician.  Recommendations may be updated based on patient status, additional functional criteria and insurance authorization.  Follow Up Recommendations  Skilled nursing-short term rehab (<3 hours/day)        Equipment Recommendations  Rolling walker (2 wheels);Wheelchair (measurements PT);Wheelchair cushion (measurements PT) (if unable to progress mobility pt may benefit from W/c initially)    Recommendations for Other Services       Precautions / Restrictions Precautions Precautions: Fall Restrictions Weight Bearing Restrictions: Yes LLE Weight  Bearing: Weight bearing as tolerated     Mobility  Bed Mobility Overal bed mobility: Needs Assistance Bed Mobility: Supine to Sit;Sit to Supine     Supine to sit: +2 for physical assistance;Max assist Sit to supine: +2 for physical assistance;Total assist   General bed mobility comments: increased time and effort, vc for hand placement and use of R leg to offweight L hip for movement and reaching with L hand to bed rail with multiple scoots, after fatigue with sittingpt starts to lean back in bed requiring total Ax2 for bringing LE back into bed and squaring trunk with bed                        Balance Overall balance assessment: Needs assistance Sitting-balance support: Feet supported;Single extremity supported Sitting balance-Leahy Scale: Fair   Postural control: Left lateral lean                                  Cognition Arousal/Alertness: Awake/alert Behavior During Therapy: WFL for tasks assessed/performed Overall Cognitive Status: Within Functional Limits for tasks assessed                                          Exercises Other Exercises Other Exercises: unsupported balance, x 20 sec x2 Other Exercises: breathing exercises x 2 bouts of 10    General Comments General comments (skin integrity, edema, etc.): Daughter and son in-law in room, providing encouragement, worked with patient on breathing techniques  for pain management      Pertinent Vitals/Pain Pain Assessment: Faces Faces Pain Scale: Hurts whole lot Pain Location: L hip, R shoulder, back Pain Descriptors / Indicators: Tiring;Sore;Grimacing Pain Intervention(s): Limited activity within patient's tolerance;Utilized relaxation techniques;Monitored during session;Relaxation;Other (comment) (asked K-pad to be ordered)     PT Goals (current goals can now be found in the care plan section) Acute Rehab PT Goals Patient Stated Goal: Pt wants to return home PT Goal  Formulation: With patient Time For Goal Achievement: 04/08/21 Potential to Achieve Goals: Fair Progress towards PT goals: Not progressing toward goals - comment (limited by pain)    Frequency    Min 5X/week      PT Plan Discharge plan needs to be updated       AM-PAC PT "6 Clicks" Mobility   Outcome Measure  Help needed turning from your back to your side while in a flat bed without using bedrails?: A Lot Help needed moving from lying on your back to sitting on the side of a flat bed without using bedrails?: Total Help needed moving to and from a bed to a chair (including a wheelchair)?: Total Help needed standing up from a chair using your arms (e.g., wheelchair or bedside chair)?: Total Help needed to walk in hospital room?: Total Help needed climbing 3-5 steps with a railing? : Total 6 Click Score: 7    End of Session   Activity Tolerance: Patient limited by pain;Patient limited by fatigue Patient left: with call bell/phone within reach;in bed;with bed alarm set;with family/visitor present Nurse Communication: Mobility status PT Visit Diagnosis: Unsteadiness on feet (R26.81);Other abnormalities of gait and mobility (R26.89);Repeated falls (R29.6);Muscle weakness (generalized) (M62.81);History of falling (Z91.81);Pain     Time: 1610-9604 PT Time Calculation (min) (ACUTE ONLY): 47 min  Charges:  $Therapeutic Activity: 38-52 mins                     Eugene [name] B. Eugene [name] PT, DPT Acute Rehabilitation Services Pager [phone] Office [phone]    Eugene [name] 03/28/2021, 1:34 PM

## 2021-03-28 NOTE — Progress Notes (Signed)
[facility] Progress Note   Subjective:   Patient continues to have pain related to his hip.  Appetite has been okay.  Denies significant nausea or vomiting.  Objective Vitals:   03/27/21 1118 03/27/21 2150 03/28/21 0511 03/28/21 0753  BP: 115/72 (!) 142/97 102/60 125/70  Pulse: (!) 103  94 95  Resp: 19  16   Temp: 98.2 F (36.8 C) (!) 97.5 F (36.4 C) 98.5 F (36.9 C) 98.6 F (37 C)  TempSrc: Oral Oral Oral Oral  SpO2: 97% 99% 98% 96%  Weight:   84.7 kg   Height:       Physical Exam Gen: elderly man sitting in bed in no distress Eyes:  anicteric, EOMI, glasses ENT: MMM Neck: supple, no JVD CV:  HR in low 100s Lungs: Bilateral chest rise with no increased work of breathing GU: no foley Extr:  no edema Neuro: grossly nonfocal but unclear on some details  Additional Objective Labs: Basic Metabolic Panel: Recent Labs  Lab 03/26/21 1631 03/27/21 0337 03/27/21 0837 03/27/21 2048 03/28/21 0350 03/28/21 0831  NA 124* 123*   < > 124* 125* 127*  K 4.0 3.7  --   --  3.7  --   CL 91* 93*  --   --  93*  --   CO2 25 23  --   --  23  --   GLUCOSE 112* 106*  --   --  109*  --   BUN 12 11  --   --  49*  --   CREATININE 1.22 1.00  --   --  1.02  --   CALCIUM 8.6* 8.4*  --   --  9.0  --   PHOS 2.5  --   --   --   --   --    < > = values in this interval not displayed.   Liver Function Tests: Recent Labs  Lab 03/26/21 0114 03/26/21 1631 03/27/21 0337 03/28/21 0350  AST 34  --  24 32  ALT 24  --  25 36  ALKPHOS 45  --  43 47  BILITOT 1.7*  --  1.6* 1.7*  PROT 5.2*  --  5.2* 5.8*  ALBUMIN 2.9* 3.2* 2.7* 2.9*   No results for input(s): LIPASE, AMYLASE in the last 168 hours. CBC: Recent Labs  Lab 03/24/21 1404 03/25/21 0308 03/26/21 0114 03/27/21 0337 03/28/21 0350  WBC 11.1* 7.6 7.5 7.7 9.1  NEUTROABS  --   --  6.2 5.7 7.0  HGB 13.3 10.9* 10.1* 10.0* 10.6*  HCT 35.8* 29.9* 27.7* 28.6* 29.2*  MCV 91.6 92.0 93.3 95.7 94.2  PLT 225 184  189 207 253   Blood Culture No results found for: SDES, SPECREQUEST, CULT, REPTSTATUS  Cardiac Enzymes: No results for input(s): CKTOTAL, CKMB, CKMBINDEX, TROPONINI in the last 168 hours. CBG: Recent Labs  Lab 03/24/21 1731  GLUCAP 120*   Iron Studies: No results for input(s): IRON, TIBC, TRANSFERRIN, FERRITIN in the last 72 hours. @lablastinr3 @ Studies/Results: No results found. Medications:   ALPRAZolam  0.5 mg Oral BID   apixaban  5 mg Oral BID   atorvastatin  20 mg Oral Daily   metoprolol tartrate  25 mg Oral BID   urea  30 g Oral BID    Assessment/Plan **Hyponatremia: Thought to initially be hypovolemic hyponatremia given dehydration and thiazide use but with correction of volume status sodium improved but now plateaued.  Urine sodium 47 and urine osmolality 358 off  of fluids and diuretics suggesting that ADH is overactive.  No obvious signs of significant dehydration on my exam at this poing.  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  Given slow improvement will give one time dose of tolvaptan 15mg  today and liberalize fluid status. Continue urea 30g BID. Monitor Na closely. Goal 132 by tomorrow morning.    **Hypokalemia: Resolved with supplementation  **HTN: on HCTZ, ramipril and metoprolol at home.  BB on here for A fib but others held.  BP on low side.    **Hip fracture:  nonoperative management   Will follow closely, call with issues.

## 2021-03-29 ENCOUNTER — Inpatient Hospital Stay (HOSPITAL_COMMUNITY): Payer: Medicare Other

## 2021-03-29 LAB — COMPREHENSIVE METABOLIC PANEL
ALT: 60 U/L — ABNORMAL HIGH (ref 0–44)
AST: 53 U/L — ABNORMAL HIGH (ref 15–41)
Albumin: 2.7 g/dL — ABNORMAL LOW (ref 3.5–5.0)
Alkaline Phosphatase: 50 U/L (ref 38–126)
Anion gap: 8 (ref 5–15)
BUN: 44 mg/dL — ABNORMAL HIGH (ref 8–23)
CO2: 24 mmol/L (ref 22–32)
Calcium: 8.9 mg/dL (ref 8.9–10.3)
Chloride: 96 mmol/L — ABNORMAL LOW (ref 98–111)
Creatinine, Ser: 1.23 mg/dL (ref 0.61–1.24)
GFR, Estimated: 58 mL/min — ABNORMAL LOW (ref >60)
Glucose, Bld: 112 mg/dL — ABNORMAL HIGH (ref 70–99)
Potassium: 3.9 mmol/L (ref 3.5–5.1)
Sodium: 128 mmol/L — ABNORMAL LOW (ref 135–145)
Total Bilirubin: 1.2 mg/dL (ref 0.3–1.2)
Total Protein: 5.8 g/dL — ABNORMAL LOW (ref 6.5–8.1)

## 2021-03-29 LAB — CBC WITH DIFFERENTIAL/PLATELET
Abs Immature Granulocytes: 0.11 10*3/uL — ABNORMAL HIGH (ref 0.00–0.07)
Basophils Absolute: 0 10*3/uL (ref 0.0–0.1)
Basophils Relative: 0 %
Eosinophils Absolute: 0.1 10*3/uL (ref 0.0–0.5)
Eosinophils Relative: 1 %
HCT: 30.1 % — ABNORMAL LOW (ref 39.0–52.0)
Hemoglobin: 10.6 g/dL — ABNORMAL LOW (ref 13.0–17.0)
Immature Granulocytes: 1 %
Lymphocytes Relative: 13 %
Lymphs Abs: 1 10*3/uL (ref 0.7–4.0)
MCH: 33.8 pg (ref 26.0–34.0)
MCHC: 35.2 g/dL (ref 30.0–36.0)
MCV: 95.9 fL (ref 80.0–100.0)
Monocytes Absolute: 1.2 10*3/uL — ABNORMAL HIGH (ref 0.1–1.0)
Monocytes Relative: 14 %
Neutro Abs: 5.8 10*3/uL (ref 1.7–7.7)
Neutrophils Relative %: 71 %
Platelets: 258 10*3/uL (ref 150–400)
RBC: 3.14 MIL/uL — ABNORMAL LOW (ref 4.22–5.81)
RDW: 13.9 % (ref 11.5–15.5)
WBC: 8.2 10*3/uL (ref 4.0–10.5)
nRBC: 0 % (ref 0.0–0.2)

## 2021-03-29 LAB — SODIUM: Sodium: 129 mmol/L — ABNORMAL LOW (ref 135–145)

## 2021-03-29 LAB — MAGNESIUM: Magnesium: 2 mg/dL (ref 1.7–2.4)

## 2021-03-29 LAB — BRAIN NATRIURETIC PEPTIDE: B Natriuretic Peptide: 380.7 pg/mL — ABNORMAL HIGH (ref 0.0–100.0)

## 2021-03-29 MED ORDER — DICLOFENAC SODIUM 1 % EX GEL
2.0000 g | Freq: Three times a day (TID) | CUTANEOUS | Status: DC
  Administered 2021-03-29 – 2021-04-02 (×12): 2 g via TOPICAL
  Filled 2021-03-29: qty 100

## 2021-03-29 NOTE — Progress Notes (Addendum)
RN concerned about possible hematoma to left hip. Consulted with Consulting civil engineer. Marked borders. Spoke to MD about concerns. Will continue to monitor.  MD wants to hold night time dose of Eliquis.

## 2021-03-29 NOTE — Progress Notes (Signed)
Physical Therapy Treatment Patient Details Name: Eugene [name] MRN: [id number] DOB: 1935/12/20 Today's Date: 03/29/2021   History of Present Illness Pt is an 85 y.o. male admitted on 03/24/21 with c/o general weakness, multiple falls, right shoulder pain, and acute left hip pain. Chest x-ray and CT of head and neck negative. Lab showed hyponatermia. X-ray of left hip showed acute fx (non-operative management, WBAT). X-ray of right shoulder showed no fx. PMH includes AAA s/p stent graft, aortic stenosis, HDL, HTN, TIA, permanent a-fib.    PT Comments    Pt supine in bed on entry, slightly lethargic, having difficulty staying awake while ordering dinner. Pt reluctantly agreeable to getting OOB. Pt continues to be limited in safe mobility by lethargy, pain (today in L hip and bilateral shoulders) and generalized weakness and associated decreased balance. Pt requires maxAx 2 for bed mobility and modAx2 to total Ax2 for sit> stand in [place]. Once in recliner established exercises that did not bring on pain including core activation with pulling back off of recliner and LE exercise. Encouraged pt to perform frequently while sitting up. D/c plans remain appropriate. PT will continue to follow acutely.    Recommendations for follow up therapy are one component of a multi-disciplinary discharge planning process, led by the attending physician.  Recommendations may be updated based on patient status, additional functional criteria and insurance authorization.  Follow Up Recommendations  Skilled nursing-short term rehab (<3 hours/day)        Equipment Recommendations  Rolling walker (2 wheels);Wheelchair (measurements PT);Wheelchair cushion (measurements PT) (if unable to progress mobility pt may benefit from W/c initially)       Precautions / Restrictions Precautions Precautions: Fall Restrictions Weight Bearing Restrictions: Yes LLE Weight Bearing: Weight bearing as tolerated     Mobility   Bed Mobility Overal bed mobility: Needs Assistance Bed Mobility: Supine to Sit;Sit to Supine     Supine to sit: +2 for physical assistance;Max assist     General bed mobility comments: maxA and maximal cuing for management of LE across bed and trunk to upright. pt with moaning in pain throughout    Transfers Overall transfer level: Needs assistance   Transfers: Sit to/from Stand Sit to Stand: Mod assist;+2 physical assistance;From elevated surface;Total assist           General transfer comment: modAx2 for pulling to upright in [place], vc for upright trunk and posterior pelvic rotation, pt leaning forehead on Steady bar and requires total A for coming to standing enough to remove Stedy pads to sit back in recliner. Transfer via Lift Equipment: Stedy         Balance Overall balance assessment: Needs assistance Sitting-balance support: Feet supported;Single extremity supported Sitting balance-Leahy Scale: Fair   Postural control: Left lateral lean   Standing balance-Leahy Scale: Zero                              Cognition Arousal/Alertness: Awake/alert Behavior During Therapy: WFL for tasks assessed/performed Overall Cognitive Status: Within Functional Limits for tasks assessed                                          Exercises General Exercises - Lower Extremity Long Arc Quad: AROM;Both;5 reps;Seated Toe Raises: AROM;Both;5 reps;Seated Heel Raises: AROM;Both;5 reps;Seated Other Exercises Other Exercises: pulling trunk forward from recliner x 10  General Comments General comments (skin integrity, edema, etc.): pt more lethargic today      Pertinent Vitals/Pain Pain Assessment: Faces Faces Pain Scale: Hurts whole lot Pain Location: L hip, R shoulder, back Pain Descriptors / Indicators: Tiring;Sore;Grimacing Pain Intervention(s): Limited activity within patient's tolerance     PT Goals (current goals can now be found in the  care plan section) Acute Rehab PT Goals Patient Stated Goal: Pt wants to return home PT Goal Formulation: With patient Time For Goal Achievement: 04/08/21 Potential to Achieve Goals: Fair Progress towards PT goals: Progressing toward goals    Frequency    Min 3X/week      PT Plan Discharge plan needs to be updated       AM-PAC PT "6 Clicks" Mobility   Outcome Measure  Help needed turning from your back to your side while in a flat bed without using bedrails?: A Lot Help needed moving from lying on your back to sitting on the side of a flat bed without using bedrails?: Total Help needed moving to and from a bed to a chair (including a wheelchair)?: Total Help needed standing up from a chair using your arms (e.g., wheelchair or bedside chair)?: Total Help needed to walk in hospital room?: Total Help needed climbing 3-5 steps with a railing? : Total 6 Click Score: 7    End of Session   Activity Tolerance: Patient limited by pain;Patient limited by fatigue Patient left: with call bell/phone within reach;in bed;with bed alarm set;with family/visitor present Nurse Communication: Mobility status PT Visit Diagnosis: Unsteadiness on feet (R26.81);Other abnormalities of gait and mobility (R26.89);Repeated falls (R29.6);Muscle weakness (generalized) (M62.81);History of falling (Z91.81);Pain     Time: [id number] PT Time Calculation (min) (ACUTE ONLY): 37 min  Charges:  $Therapeutic Exercise: 8-22 mins $Therapeutic Activity: 8-22 mins                     Verl Whitmore B. Migdalia Dk PT, DPT Acute Rehabilitation Services Pager [phone] Office [phone]    [place] 03/29/2021, 3:33 PM

## 2021-03-29 NOTE — Progress Notes (Signed)
[facility] Progress Note   Subjective:   Patient lying in bed with no distress.  Denies any particular issues other than pain related to his hip.  Objective Vitals:   03/28/21 2322 03/29/21 0416 03/29/21 1012 03/29/21 1124  BP: 103/61 108/65 106/65 110/69  Pulse: 92 92 100 90  Resp: 16 16 16 18   Temp: 98.4 F (36.9 C) 98 F (36.7 C) 97.6 F (36.4 C) 97.8 F (36.6 C)  TempSrc: Oral Oral Oral Oral  SpO2: 98% 97% 97% 96%  Weight:      Height:       Physical Exam Gen: Lying in bed in no distress Eyes:  anicteric, EOMI, glasses ENT: MMM Neck: supple, no JVD CV: Normal rate Lungs: Bilateral chest rise with no increased work of breathing Extr:  no edema, warm and well perfused  Additional Objective Labs: Basic Metabolic Panel: Recent Labs  Lab 03/26/21 1631 03/27/21 0337 03/27/21 0837 03/28/21 0350 03/28/21 0831 03/28/21 1520 03/28/21 2056 03/29/21 0333  NA 124* 123*   < > 125*   < > 126* 128* 128*  K 4.0 3.7  --  3.7  --   --   --  3.9  CL 91* 93*  --  93*  --   --   --  96*  CO2 25 23  --  23  --   --   --  24  GLUCOSE 112* 106*  --  109*  --   --   --  112*  BUN 12 11  --  49*  --   --   --  44*  CREATININE 1.22 1.00  --  1.02  --   --   --  1.23  CALCIUM 8.6* 8.4*  --  9.0  --   --   --  8.9  PHOS 2.5  --   --   --   --   --   --   --    < > = values in this interval not displayed.   Liver Function Tests: Recent Labs  Lab 03/27/21 0337 03/28/21 0350 03/29/21 0333  AST 24 32 53*  ALT 25 36 60*  ALKPHOS 43 47 50  BILITOT 1.6* 1.7* 1.2  PROT 5.2* 5.8* 5.8*  ALBUMIN 2.7* 2.9* 2.7*   No results for input(s): LIPASE, AMYLASE in the last 168 hours. CBC: Recent Labs  Lab 03/25/21 0308 03/25/21 0308 03/26/21 0114 03/27/21 0337 03/28/21 0350 03/29/21 0333  WBC 7.6  --  7.5 7.7 9.1 8.2  NEUTROABS  --    < > 6.2 5.7 7.0 5.8  HGB 10.9*  --  10.1* 10.0* 10.6* 10.6*  HCT 29.9*  --  27.7* 28.6* 29.2* 30.1*  MCV 92.0  --  93.3 95.7 94.2  95.9  PLT 184  --  189 207 253 258   < > = values in this interval not displayed.   Blood Culture No results found for: SDES, SPECREQUEST, CULT, REPTSTATUS  Cardiac Enzymes: No results for input(s): CKTOTAL, CKMB, CKMBINDEX, TROPONINI in the last 168 hours. CBG: Recent Labs  Lab 03/24/21 1731  GLUCAP 120*   Iron Studies: No results for input(s): IRON, TIBC, TRANSFERRIN, FERRITIN in the last 72 hours. @lablastinr3 @ Studies/Results: MR SHOULDER RIGHT WO CONTRAST  Result Date: 03/29/2021 CLINICAL DATA:  Right shoulder pain and limited range of motion. EXAM: MRI OF THE RIGHT SHOULDER WITHOUT CONTRAST TECHNIQUE: Multiplanar, multisequence MR imaging of the shoulder was performed. No intravenous contrast was administered. COMPARISON:  Right shoulder x-ray dated March 24, 2021. FINDINGS: Limited study due to required protocol modifications related to patient's EVAR stent graft. Fluid sensitive sequences could not be obtained. Rotator cuff: Severe supraspinatus tendinosis with fraying at the insertion but no discrete tear. Intact infraspinatus, subscapularis, and teres minor tendons. Muscles: No atrophy or abnormal signal of the muscles of the rotator cuff. Biceps long head:  Intact and normally positioned. Acromioclavicular Joint: Moderate arthropathy of the acromioclavicular joint. Type I acromion. No significant subacromial/subdeltoid bursal fluid. Glenohumeral Joint: No joint effusion. No chondral defect. Labrum: Grossly intact, but evaluation is limited by lack of intraarticular fluid. Bones:  No marrow abnormality, fracture or dislocation. Other: None. IMPRESSION: 1. Limited study due to required protocol modifications related to patient's EVAR stent graft. 2. Severe supraspinatus tendinosis with fraying at the insertion but no discrete tear. 3. Moderate acromioclavicular osteoarthritis. Electronically Signed   By: Obie Dredge M.D.   On: 03/29/2021 10:16   Medications:   ALPRAZolam  0.5 mg  Oral BID   apixaban  5 mg Oral BID   atorvastatin  20 mg Oral Daily   diclofenac Sodium  2 g Topical TID   metoprolol tartrate  25 mg Oral BID   urea  30 g Oral TID    Assessment/Plan **Hyponatremia: Thought to initially be hypovolemic hyponatremia given dehydration and thiazide use but with correction of volume status sodium improved but now plateaued.  Urine sodium 47 and urine osmolality 358 off of fluids and diuretics suggesting that ADH is overactive.  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  Received 15mg  tolvaptan on 11/1 with some improvement.  Increase urea to 30g TID and hopefully >130 by tomorrow   **Hypokalemia: Resolved with supplementation  **HTN: on HCTZ, ramipril and metoprolol at home.  BB on here for A fib but others held.  BP on low side.    **Hip fracture:  nonoperative management   Will follow closely, call with issues.

## 2021-03-29 NOTE — Progress Notes (Signed)
Mobility Specialist: Progress Note   03/29/21 1648  Mobility  Activity Transferred:  Chair to bed  Level of Assistance +2 (takes two people)  Assistive Device Stedy  Mobility Out of bed to chair with meals  Mobility Response Tolerated poorly  Mobility performed by Mobility specialist  $Mobility charge 1 Mobility   Pt assisted back to bed per request using Stedy. Pt required +2 physical assist to stand from the recliner with help from other mobility specialist. Pt back in bed, skin tear on RUE with RN present in the room.   [facility] Chad Tiznado Mobility Specialist Mobility Specialist Phone: [phone]

## 2021-03-29 NOTE — Progress Notes (Signed)
PROGRESS NOTE                                                                                                                                                                                                             Patient Demographics:    Eugene [name], is a 85 y.o. male, DOB - January 18, 1936, [id number]  Outpatient Primary MD for the patient is [name], MD    LOS - 5  Admit date - 03/24/2021    Chief Complaint  Patient presents with   Weakness       Brief Narrative (HPI from H&P)    Eugene [name] is a 85 y.o. male with medical history significant of permanent A. fib on Eliquis, mild aortic stenosis/regurgitation, hypertension, hyperlipidemia, CVA/TIA, IBS, hemochromatosis followed by [name], GERD, AAA status post repair presented to the ED complaining of generalized weakness, multiple falls, and acute onset left hip pain.  He presented to the ER with generalized weakness and ongoing left hip pain was diagnosed with severe dehydration, hypokalemia, hypomagnesemia, left greater trochanteric fracture, renal and orthopedics were consulted and he was admitted to the hospital.   Subjective:   Patient in bed, appears comfortable, denies any headache, no fever, no chest pain or pressure, no shortness of breath , no abdominal pain. No new focal weakness, +ve R. Shoulder and L. Hip pain.   Assessment  & Plan :     Severe Hyponatremia, Hypotension, Dehydration - multiple falls - all likely from HCTZ and PPI, BP Meds held, PPI discontinued, potassium and magnesium have been replaced, renal is following and managing hyponatremia and dehydration.  2.  Multiple falls with left greater trochanteric fracture, hip pain.  Seen by Ortho medical management, also has right shoulder pain, right shoulder MRI - ? Mild soft tissue injury, DW Ortho 03/27/21, continue supportive care, PT- OT >> SNF.  3. HTN.  Blood pressure  is low, blood pressure medications held.  4.  Hypokalemia and hypomagnesemia.  Replace and monitor.  5.  Permanent A. fib with [place] vas 2 score of 3.  Continue Eliquis with caution, beta-blocker if blood pressure tolerates, post 2 doses of digoxin on 03/25/21 and repeat 1 dose on 03/28/21 for short-term for rate control, amiodarone if all fails.  6.  Dyslipidemia.  On statin.  7.  History of hemochromatosis.  Outpatient  follow-up.  8.  Anxiety and depression.  Trazodone.      Condition - Fair  Family Communication  :    [name] [phone] 03/27/21, 03/29/21  wife [name] [phone] - on 03/25/21 @ 9.21 - message left.  Code Status :  Full  Consults  :  Renal, Ortho  PUD Prophylaxis :    Procedures  :     MRI R Shoulder - 1. Limited study due to required protocol modifications related to patient's EVAR stent graft. 2. Severe supraspinatus tendinosis with fraying at the insertion but no discrete tear. 3. Moderate acromioclavicular osteoarthritis  CT - Head and C Spine - 1. No acute intracranial or calvarial findings. 2. Atrophy and mild chronic small vessel ischemic changes. 3. No evidence of acute cervical spine fracture, traumatic subluxation or static signs of instability. 4. Multilevel cervical spondylosis.      Disposition Plan  :    Status is: Inpatient  Remains inpatient appropriate because: Severe Hyponatremia, left hip fracture, right shoulder injury >> SNF.  DVT Prophylaxis  :    apixaban (ELIQUIS) tablet 5 mg    Lab Results  Component Value Date   PLT 258 03/29/2021    Diet :  Diet Order             Diet regular Room service appropriate? Yes; Fluid consistency: Thin; Fluid restriction: 1800 mL Fluid  Diet effective now                    Inpatient Medications  Scheduled Meds:  ALPRAZolam  0.5 mg Oral BID   apixaban  5 mg Oral BID   atorvastatin  20 mg Oral Daily   diclofenac Sodium  2 g Topical TID   metoprolol tartrate  25 mg Oral  BID   urea  30 g Oral TID   Continuous Infusions:   PRN Meds:.acetaminophen, HYDROcodone-acetaminophen, metoprolol tartrate, traMADol, traZODone  Antibiotics  :    Anti-infectives (From admission, onward)    None       Time Spent in minutes  30   Lala Lund M.D on 03/29/2021 at 10:42 AM  To page go to [email or web address]   [facility] -  Office  [phone]  See all Orders from today for further details    Objective:   Vitals:   03/28/21 1944 03/28/21 2322 03/29/21 0416 03/29/21 1012  BP: 112/69 103/61 108/65 106/65  Pulse: 92 92 92 100  Resp: 18 16 16 16   Temp: 99.1 F (37.3 C) 98.4 F (36.9 C) 98 F (36.7 C) 97.6 F (36.4 C)  TempSrc: Oral Oral Oral Oral  SpO2: 99% 98% 97% 97%  Weight:      Height:        Wt Readings from Last 3 Encounters:  03/28/21 84.7 kg  02/15/21 88.2 kg  05/16/20 87.9 kg     Intake/Output Summary (Last 24 hours) at 03/29/2021 1042 Last data filed at 03/29/2021 1030 Gross per 24 hour  Intake 120 ml  Output 2825 ml  Net -2705 ml     Physical Exam  Awake Alert, No new F.N deficits, Normal affect [place].AT,PERRAL Supple Neck, No JVD,   Symmetrical Chest wall movement, Good air movement bilaterally, CTAB RRR,No Gallops, Rubs or new Murmurs,  +ve B.Sounds, Abd Soft, No tenderness,   No Cyanosis, Clubbing or edema     Data Review:    CBC Recent Labs  Lab 03/25/21 0308 03/26/21 0114 03/27/21 0337 03/28/21 0350 03/29/21 0333  WBC 7.6  7.5 7.7 9.1 8.2  HGB 10.9* 10.1* 10.0* 10.6* 10.6*  HCT 29.9* 27.7* 28.6* 29.2* 30.1*  PLT 184 189 207 253 258  MCV 92.0 93.3 95.7 94.2 95.9  MCH 33.5 34.0 33.4 34.2* 33.8  MCHC 36.5* 36.5* 35.0 36.3* 35.2  RDW 13.2 13.6 13.7 13.7 13.9  LYMPHSABS  --  0.5* 0.8 0.9 1.0  MONOABS  --  0.8 1.0 1.1* 1.2*  EOSABS  --  0.0 0.1 0.1 0.1  BASOSABS  --  0.0 0.0 0.0 0.0    Recent Labs  Lab 03/24/21 1700 03/24/21 2227 03/25/21 0308 03/25/21 0853 03/26/21 0114 03/26/21 1014  03/26/21 1631 03/27/21 0337 03/27/21 0837 03/28/21 0350 03/28/21 0831 03/28/21 1520 03/28/21 2056 03/29/21 0333  NA 119*   < > 119*   < > 122*  122* 125* 124* 123*   < > 125* 127* 126* 128* 128*  K 3.1*   < > 3.1*   < > 3.9  3.9 4.0 4.0 3.7  --  3.7  --   --   --  3.9  CL 82*   < > 85*   < > 93*  92* 94* 91* 93*  --  93*  --   --   --  96*  CO2 22   < > 23   < > 22  22 23 25 23   --  23  --   --   --  24  GLUCOSE 112*   < > 101*   < > 112*  112* 138* 112* 106*  --  109*  --   --   --  112*  BUN 16   < > 15   < > 13  12 11 12 11   --  49*  --   --   --  44*  CREATININE 1.07   < > 1.02   < > 1.01  1.04 1.10 1.22 1.00  --  1.02  --   --   --  1.23  CALCIUM 9.3   < > 8.6*   < > 8.4*  8.4* 8.6* 8.6* 8.4*  --  9.0  --   --   --  8.9  AST 65*  --   --   --  34  --   --  24  --  32  --   --   --  53*  ALT 32  --   --   --  24  --   --  25  --  36  --   --   --  60*  ALKPHOS 52  --   --   --  45  --   --  43  --  47  --   --   --  50  BILITOT 3.3*  --   --   --  1.7*  --   --  1.6*  --  1.7*  --   --   --  1.2  ALBUMIN 3.7  --   --   --  2.9*  --  3.2* 2.7*  --  2.9*  --   --   --  2.7*  MG 1.6*  --  1.9  --  1.9  --   --  1.8  --  1.9  --   --   --  2.0  INR 1.3*  --   --   --   --   --   --   --   --   --   --   --   --   --  TSH  --   --  1.160  --   --   --   --   --   --   --   --   --   --   --   BNP  --   --   --   --  218.4*  --   --  519.8*  --  621.6*  --   --   --  380.7*   < > = values in this interval not displayed.    ------------------------------------------------------------------------------------------------------------------ No results for input(s): CHOL, HDL, LDLCALC, TRIG, CHOLHDL, LDLDIRECT in the last 72 hours.  No results found for: HGBA1C ------------------------------------------------------------------------------------------------------------------ No results for input(s): TSH, T4TOTAL, T3FREE, THYROIDAB in the last 72 hours.  Invalid input(s):  FREET3   Cardiac Enzymes No results for input(s): CKMB, TROPONINI, MYOGLOBIN in the last 168 hours.  Invalid input(s): CK ------------------------------------------------------------------------------------------------------------------    Component Value Date/Time   BNP 380.7 (H) 03/29/2021 0333     Radiology Reports DG Chest 2 View  Result Date: 03/24/2021 CLINICAL DATA:  Weakness. EXAM: CHEST - 2 VIEW COMPARISON:  Radiographs 05/26/2019.  CT 11/06/2005. FINDINGS: The heart size and mediastinal contours are stable with aortic atherosclerosis. There is mild left basilar atelectasis. The lungs otherwise appear clear, without confluent airspace opacity, edema or pleural effusion. No acute fractures are seen. There are mild degenerative changes in the spine. IMPRESSION: No evidence of active cardiopulmonary process. Electronically Signed   By: Richardean Sale M.D.   On: 03/24/2021 15:33   DG Shoulder Right  Result Date: 03/24/2021 CLINICAL DATA:  Right shoulder pain after fall. EXAM: RIGHT SHOULDER - 2+ VIEW COMPARISON:  Radiograph 04/05/2020 FINDINGS: There is no evidence of fracture or dislocation. Moderate acromioclavicular degenerative change. Small subacromial spur. There is soft tissue calcifications adjacent to the lateral humeral head, not seen on prior exam. No erosion or bone destruction. IMPRESSION: 1. No fracture or dislocation of the right shoulder. 2. Soft tissue calcifications adjacent to the lateral humeral head, new from prior exam, may represent calcific tendinopathy or bursitis. Electronically Signed   By: Keith Rake M.D.   On: 03/24/2021 21:50   CT HEAD WO CONTRAST (5MM)  Result Date: 03/24/2021 CLINICAL DATA:  Generalized weakness that started 1 week ago. Multiple falls. EXAM: CT HEAD WITHOUT CONTRAST CT CERVICAL SPINE WITHOUT CONTRAST TECHNIQUE: Multidetector CT imaging of the head and cervical spine was performed following the standard protocol without  intravenous contrast. Multiplanar CT image reconstructions of the cervical spine were also generated. COMPARISON:  None. FINDINGS: CT HEAD FINDINGS Brain: There is no evidence of acute intracranial hemorrhage, mass lesion, brain edema or extra-axial fluid collection. Mild atrophy with prominence of the ventricles and subarachnoid spaces. The dilatation of the lateral and 3rd ventricles is in slight excess of the sulcal prominence, but there is no definite hydrocephalus. Mild patchy low-density in the periventricular white matter likely reflects chronic small vessel ischemic changes. There is no CT evidence of acute cortical infarction. Vascular: Intracranial vascular calcifications. No hyperdense vessel identified. Skull: Negative for fracture or focal lesion. Sinuses/Orbits: The visualized paranasal sinuses and mastoid air cells are clear. No orbital abnormalities are seen. Other: None. CT CERVICAL SPINE FINDINGS Alignment: Normal. Skull base and vertebrae: No evidence of acute cervical spine fracture or traumatic subluxation. Soft tissues and spinal canal: No prevertebral fluid or swelling. No visible canal hematoma. There is synovial calcifications surrounding the odontoid process without osseous erosion or mass effect on the craniocervical junction. Disc levels:  Multilevel cervical spondylosis with disc bulging, uncinate spurring and facet hypertrophy. There is mild to moderate resulting osseous foraminal narrowing which appears worst at the C4-5 and C6-7 levels. Upper chest: Emphysematous changes and mild scarring at both lung apices. Severe bilateral carotid atherosclerosis. Other: None. IMPRESSION: 1. No acute intracranial or calvarial findings. 2. Atrophy and mild chronic small vessel ischemic changes. 3. No evidence of acute cervical spine fracture, traumatic subluxation or static signs of instability. 4. Multilevel cervical spondylosis. Electronically Signed   By: Richardean Sale M.D.   On: 03/24/2021  15:59   CT Cervical Spine Wo Contrast  Result Date: 03/24/2021 CLINICAL DATA:  Generalized weakness that started 1 week ago. Multiple falls. EXAM: CT HEAD WITHOUT CONTRAST CT CERVICAL SPINE WITHOUT CONTRAST TECHNIQUE: Multidetector CT imaging of the head and cervical spine was performed following the standard protocol without intravenous contrast. Multiplanar CT image reconstructions of the cervical spine were also generated. COMPARISON:  None. FINDINGS: CT HEAD FINDINGS Brain: There is no evidence of acute intracranial hemorrhage, mass lesion, brain edema or extra-axial fluid collection. Mild atrophy with prominence of the ventricles and subarachnoid spaces. The dilatation of the lateral and 3rd ventricles is in slight excess of the sulcal prominence, but there is no definite hydrocephalus. Mild patchy low-density in the periventricular white matter likely reflects chronic small vessel ischemic changes. There is no CT evidence of acute cortical infarction. Vascular: Intracranial vascular calcifications. No hyperdense vessel identified. Skull: Negative for fracture or focal lesion. Sinuses/Orbits: The visualized paranasal sinuses and mastoid air cells are clear. No orbital abnormalities are seen. Other: None. CT CERVICAL SPINE FINDINGS Alignment: Normal. Skull base and vertebrae: No evidence of acute cervical spine fracture or traumatic subluxation. Soft tissues and spinal canal: No prevertebral fluid or swelling. No visible canal hematoma. There is synovial calcifications surrounding the odontoid process without osseous erosion or mass effect on the craniocervical junction. Disc levels: Multilevel cervical spondylosis with disc bulging, uncinate spurring and facet hypertrophy. There is mild to moderate resulting osseous foraminal narrowing which appears worst at the C4-5 and C6-7 levels. Upper chest: Emphysematous changes and mild scarring at both lung apices. Severe bilateral carotid atherosclerosis. Other:  None. IMPRESSION: 1. No acute intracranial or calvarial findings. 2. Atrophy and mild chronic small vessel ischemic changes. 3. No evidence of acute cervical spine fracture, traumatic subluxation or static signs of instability. 4. Multilevel cervical spondylosis. Electronically Signed   By: Richardean Sale M.D.   On: 03/24/2021 15:59   MR SHOULDER RIGHT WO CONTRAST  Result Date: 03/29/2021 CLINICAL DATA:  Right shoulder pain and limited range of motion. EXAM: MRI OF THE RIGHT SHOULDER WITHOUT CONTRAST TECHNIQUE: Multiplanar, multisequence MR imaging of the shoulder was performed. No intravenous contrast was administered. COMPARISON:  Right shoulder x-ray dated March 24, 2021. FINDINGS: Limited study due to required protocol modifications related to patient's EVAR stent graft. Fluid sensitive sequences could not be obtained. Rotator cuff: Severe supraspinatus tendinosis with fraying at the insertion but no discrete tear. Intact infraspinatus, subscapularis, and teres minor tendons. Muscles: No atrophy or abnormal signal of the muscles of the rotator cuff. Biceps long head:  Intact and normally positioned. Acromioclavicular Joint: Moderate arthropathy of the acromioclavicular joint. Type I acromion. No significant subacromial/subdeltoid bursal fluid. Glenohumeral Joint: No joint effusion. No chondral defect. Labrum: Grossly intact, but evaluation is limited by lack of intraarticular fluid. Bones:  No marrow abnormality, fracture or dislocation. Other: None. IMPRESSION: 1. Limited study due to required protocol modifications related to patient's EVAR  stent graft. 2. Severe supraspinatus tendinosis with fraying at the insertion but no discrete tear. 3. Moderate acromioclavicular osteoarthritis. Electronically Signed   By: Obie Dredge M.D.   On: 03/29/2021 10:16   DG Hip Unilat W or Wo Pelvis 2-3 Views Left  Result Date: 03/24/2021 CLINICAL DATA:  Weakness. EXAM: DG HIP (WITH OR WITHOUT PELVIS) 2-3V LEFT  COMPARISON:  Abdominal radiographs 08/02/2016. Abdominopelvic CT 03/09/2013. FINDINGS: The mineralization and alignment are normal. There are advanced degenerative changes of both hips which have progressed compared with the prior studies. There are associated osteophytes, subchondral cysts and subchondral sclerosis. There is a nondisplaced fracture of the left greater trochanter. No definite extension of this fracture cross the femoral neck or into the intertrochanteric region is identified. There are diffuse vascular calcifications status post aorto bi-iliac stent grafting. Patient is status post ventral hernia repair. IMPRESSION: 1. Acute fracture of the left greater trochanter. If the patient is unable to bear weight or there is additional clinical concern for intertrochanteric extension of this fracture, consider further evaluation with CT. 2. Advanced osteoarthritic changes of both hips. Electronically Signed   By: Carey Bullocks M.D.   On: 03/24/2021 15:37

## 2021-03-29 NOTE — NC FL2 (Signed)
[place] MEDICAID FL2 LEVEL OF CARE SCREENING TOOL     IDENTIFICATION  Patient Name: Eugene [name] Birthdate: 1936/03/02 Sex: male Admission Date (Current Location): 03/24/2021  [facility] and [place] Number:  Producer, television/film/video and Address:  [facility], [address] [place], [place] [address]      Provider Number: [id number]  Attending Physician Name and Address:  Leroy Sea, MD  Relative Name and Phone Number:  Vergil, Burby     [phone]    Current Level of Care: Hospital Recommended Level of Care: Skilled Nursing Facility Prior Approval Number:    Date Approved/Denied:   PASRR Number:    Discharge Plan: SNF    Current Diagnoses: Patient Active Problem List   Diagnosis Date Noted   Hyponatremia 03/24/2021   Greater trochanter fracture (HCC) 03/24/2021   Hypokalemia 03/24/2021   Hypomagnesemia 03/24/2021   Hypochloremia 03/24/2021   Aortic stenosis 09/05/2017   PICC (peripherally inserted central catheter) flush 02/19/2017   Benign essential HTN 02/10/2014   Permanent atrial fibrillation (HCC)    GERD (gastroesophageal reflux disease)    Aftercare following surgery of the circulatory system, NEC 10/13/2012   Occlusion and stenosis of carotid artery without mention of cerebral infarction 09/03/2011   Abdominal aneurysm without mention of rupture 09/03/2011   Hemochromatosis 08/29/2011    Orientation RESPIRATION BLADDER Height & Weight     Self, Time, Situation, Place  Normal Incontinent, External catheter Weight: 186 lb 11.7 oz (84.7 kg) Height:  6\' 1"  (185.4 cm)  BEHAVIORAL SYMPTOMS/MOOD NEUROLOGICAL BOWEL NUTRITION STATUS      Continent Diet (See DC Summary)  AMBULATORY STATUS COMMUNICATION OF NEEDS Skin   Extensive Assist Verbally Normal                       Personal Care Assistance Level of Assistance  Bathing, Dressing, Feeding Bathing Assistance: Maximum assistance Feeding assistance:  Independent Dressing Assistance: Maximum assistance     Functional Limitations Info  Sight, Hearing, Speech Sight Info: Adequate Hearing Info: Adequate Speech Info: Adequate    SPECIAL CARE FACTORS FREQUENCY  PT (By licensed PT), OT (By licensed OT)     PT Frequency: 5x a week OT Frequency: 5x a week            Contractures Contractures Info: Not present    Additional Factors Info  Code Status, Allergies Code Status Info: Full Allergies Info: Chlorhexidine Gluconate   Macrolides And Ketolides   Tape           Current Medications (03/29/2021):  This is the current hospital active medication list Current Facility-Administered Medications  Medication Dose Route Frequency Provider Last Rate Last Admin   acetaminophen (TYLENOL) tablet 500 mg  500 mg Oral Q6H PRN 13/06/2020, MD   500 mg at 03/26/21 1718   ALPRAZolam 03/28/21) tablet 0.5 mg  0.5 mg Oral BID Prudy Feeler, MD   0.5 mg at 03/29/21 1016   apixaban (ELIQUIS) tablet 5 mg  5 mg Oral BID 13/02/22, MD   5 mg at 03/29/21 1016   atorvastatin (LIPITOR) tablet 20 mg  20 mg Oral Daily 13/02/22, MD   20 mg at 03/29/21 1016   diclofenac Sodium (VOLTAREN) 1 % topical gel 2 g  2 g Topical TID 13/02/22, MD   2 g at 03/29/21 1440   HYDROcodone-acetaminophen (NORCO/VICODIN) 5-325 MG per tablet 1 tablet  1 tablet Oral Q6H PRN 13/02/22,  Stanford Scotland, MD   1 tablet at 03/29/21 1610   metoprolol tartrate (LOPRESSOR) injection 5 mg  5 mg Intravenous Q8H PRN Leroy Sea, MD       metoprolol tartrate (LOPRESSOR) tablet 25 mg  25 mg Oral BID Leroy Sea, MD   25 mg at 03/29/21 1016   traMADol (ULTRAM) tablet 50 mg  50 mg Oral Q6H PRN John Giovanni, MD   50 mg at 03/29/21 1449   traZODone (DESYREL) tablet 50 mg  50 mg Oral QHS PRN John Giovanni, MD   50 mg at 03/28/21 2244   urea (URE-NA) oral packet 30 g  30 g Oral TID Darnell Level, MD   30 g at 03/29/21 1255    Facility-Administered Medications Ordered in Other Encounters  Medication Dose Route Frequency Provider Last Rate Last Admin   alteplase (CATHFLO ACTIVASE) injection 2 mg  2 mg Intracatheter Once PRN Magrinat, Valentino Hue, MD       sodium chloride 0.9 % injection 10 mL  10 mL Intravenous PRN Magrinat, Valentino Hue, MD   10 mL at 11/25/14 1220     Discharge Medications: Please see discharge summary for a list of discharge medications.  Relevant Imaging Results:  Relevant Lab Results:   Additional Information SSN: [id number]; Pfizer COVID-19 Vaccine 03/24/2020  Deferred  (Other) , 07/28/2019 , 07/03/2019  Ivette Loyal, LCSWA

## 2021-03-29 NOTE — TOC Progression Note (Signed)
Transition of Care [facility]) - Progression Note    Patient Details  Name: Eugene [name] MRN: [id number] Date of Birth: 1935-06-17  Transition of [facility]) CM/SW Contact  Ivette Loyal, [place] Phone Number: 03/29/2021, 5:18 PM  Clinical Narrative:    CSW spoke with pt son who states that he would like for CSW to try [facility] as priority. CSW sent a message to [facility] at [facility] who will follow up with CSW in the morning for a bed offer on pt.   CSW will follow up with other local facilities for Snf placement.    Expected Discharge Plan: Home w Home Health Services    Expected Discharge Plan and Services Expected Discharge Plan: Home w Home Health Services   Discharge Planning Services: CM Consult Post Acute Care Choice: Durable Medical Equipment Living arrangements for the past 2 months: Single Family Home                           HH Arranged: PT           Social Determinants of Health (SDOH) Interventions    Readmission Risk Interventions No flowsheet data found.

## 2021-03-29 NOTE — Care Management Important Message (Signed)
Important Message  Patient Details  Name: Eugene [name] MRN: [id number] Date of Birth: 04-08-1936   Medicare Important Message Given:  Yes     Renie Ora 03/29/2021, 10:09 AM

## 2021-03-30 ENCOUNTER — Inpatient Hospital Stay (HOSPITAL_COMMUNITY): Payer: Medicare Other

## 2021-03-30 DIAGNOSIS — S72115A Nondisplaced fracture of greater trochanter of left femur, initial encounter for closed fracture: Secondary | ICD-10-CM

## 2021-03-30 DIAGNOSIS — M25512 Pain in left shoulder: Secondary | ICD-10-CM

## 2021-03-30 DIAGNOSIS — M25511 Pain in right shoulder: Secondary | ICD-10-CM

## 2021-03-30 LAB — COMPREHENSIVE METABOLIC PANEL
ALT: 143 U/L — ABNORMAL HIGH (ref 0–44)
AST: 144 U/L — ABNORMAL HIGH (ref 15–41)
Albumin: 2.6 g/dL — ABNORMAL LOW (ref 3.5–5.0)
Alkaline Phosphatase: 57 U/L (ref 38–126)
Anion gap: 6 (ref 5–15)
BUN: 83 mg/dL — ABNORMAL HIGH (ref 8–23)
CO2: 26 mmol/L (ref 22–32)
Calcium: 9.1 mg/dL (ref 8.9–10.3)
Chloride: 98 mmol/L (ref 98–111)
Creatinine, Ser: 1.19 mg/dL (ref 0.61–1.24)
GFR, Estimated: >60 mL/min (ref >60)
Glucose, Bld: 116 mg/dL — ABNORMAL HIGH (ref 70–99)
Potassium: 4 mmol/L (ref 3.5–5.1)
Sodium: 130 mmol/L — ABNORMAL LOW (ref 135–145)
Total Bilirubin: 1.1 mg/dL (ref 0.3–1.2)
Total Protein: 5.6 g/dL — ABNORMAL LOW (ref 6.5–8.1)

## 2021-03-30 LAB — CBC WITH DIFFERENTIAL/PLATELET
Abs Immature Granulocytes: 0.12 10*3/uL — ABNORMAL HIGH (ref 0.00–0.07)
Basophils Absolute: 0 10*3/uL (ref 0.0–0.1)
Basophils Relative: 0 %
Eosinophils Absolute: 0.1 10*3/uL (ref 0.0–0.5)
Eosinophils Relative: 2 %
HCT: 29.7 % — ABNORMAL LOW (ref 39.0–52.0)
Hemoglobin: 10.5 g/dL — ABNORMAL LOW (ref 13.0–17.0)
Immature Granulocytes: 2 %
Lymphocytes Relative: 13 %
Lymphs Abs: 1 10*3/uL (ref 0.7–4.0)
MCH: 33.7 pg (ref 26.0–34.0)
MCHC: 35.4 g/dL (ref 30.0–36.0)
MCV: 95.2 fL (ref 80.0–100.0)
Monocytes Absolute: 1 10*3/uL (ref 0.1–1.0)
Monocytes Relative: 13 %
Neutro Abs: 5.2 10*3/uL (ref 1.7–7.7)
Neutrophils Relative %: 70 %
Platelets: 294 10*3/uL (ref 150–400)
RBC: 3.12 MIL/uL — ABNORMAL LOW (ref 4.22–5.81)
RDW: 13.7 % (ref 11.5–15.5)
WBC: 7.5 10*3/uL (ref 4.0–10.5)
nRBC: 0 % (ref 0.0–0.2)

## 2021-03-30 LAB — MAGNESIUM: Magnesium: 2.1 mg/dL (ref 1.7–2.4)

## 2021-03-30 LAB — BRAIN NATRIURETIC PEPTIDE: B Natriuretic Peptide: 485.6 pg/mL — ABNORMAL HIGH (ref 0.0–100.0)

## 2021-03-30 MED ORDER — METHYLPREDNISOLONE ACETATE 40 MG/ML IJ SUSP
40.0000 mg | Freq: Once | INTRAMUSCULAR | Status: AC
  Administered 2021-03-31: 40 mg via INTRA_ARTICULAR
  Filled 2021-03-30: qty 1

## 2021-03-30 MED ORDER — POLYETHYLENE GLYCOL 3350 17 G PO PACK
17.0000 g | PACK | Freq: Two times a day (BID) | ORAL | Status: AC
  Administered 2021-03-30 – 2021-03-31 (×2): 17 g via ORAL
  Filled 2021-03-30 (×4): qty 1

## 2021-03-30 MED ORDER — BISACODYL 5 MG PO TBEC
10.0000 mg | DELAYED_RELEASE_TABLET | Freq: Once | ORAL | Status: AC
  Administered 2021-03-30: 10 mg via ORAL
  Filled 2021-03-30: qty 2

## 2021-03-30 MED ORDER — BUPIVACAINE HCL (PF) 0.5 % IJ SOLN
10.0000 mL | Freq: Once | INTRAMUSCULAR | Status: AC
  Administered 2021-03-31: 10 mL
  Filled 2021-03-30: qty 10

## 2021-03-30 MED ORDER — UREA 15 G PO PACK
30.0000 g | PACK | Freq: Two times a day (BID) | ORAL | Status: DC
  Administered 2021-03-30: 30 g via ORAL
  Filled 2021-03-30 (×3): qty 2

## 2021-03-30 NOTE — Plan of Care (Signed)
  Problem: Nutrition: Goal: Adequate nutrition will be maintained Outcome: Completed/Met

## 2021-03-30 NOTE — Progress Notes (Signed)
Occupational Therapy Treatment Patient Details Name: Eugene [name] MRN: [id number] DOB: [birth year] Today's Date: 03/30/2021   History of present illness Pt is an 85 y.o. male admitted on 03/24/21 with c/o general weakness, multiple falls, right shoulder pain, and acute left hip pain. Chest x-ray and CT of head and neck negative. Lab showed hyponatermia. X-ray of left hip showed acute fx (non-operative management, WBAT). X-ray of right shoulder showed no fx. PMH includes AAA s/p stent graft, aortic stenosis, HDL, HTN, TIA, permanent a-fib.   OT comments  Patient with increasing difficulty with balance and mobility this date compared to initial evaluation.  OT with change to discharge disposition.  Patient is now up to Max A with lower body ADL bed level, and needing up to +2 with basic mobility.  Given his increasing weakness and level of assist for ADL completion, short term rehab at a SNF is recommended.  The patient simply does not have that level of assist at home.  OT will continue to follow in the acute setting.     Recommendations for follow up therapy are one component of a multi-disciplinary discharge planning process, led by the attending physician.  Recommendations may be updated based on patient status, additional functional criteria and insurance authorization.    Follow Up Recommendations  Skilled nursing-short term rehab (<3 hours/day)    Assistance Recommended at Discharge Frequent or constant Supervision/Assistance  Equipment Recommendations  Wheelchair (measurements OT);Wheelchair cushion (measurements OT)    Recommendations for Other Services      Precautions / Restrictions Precautions Precautions: Fall Restrictions Weight Bearing Restrictions: Yes LLE Weight Bearing: Weight bearing as tolerated       Mobility Bed Mobility Overal bed mobility: Needs Assistance Bed Mobility: Supine to Sit     Supine to sit: HOB elevated     General bed mobility comments:  assist to advance legs and elevate trunk    Transfers Overall transfer level: Needs assistance Equipment used: Rolling walker (2 wheels) Transfers: Sit to/from Stand;Stand Pivot Transfers Sit to Stand: Max assist Stand pivot transfers: Max assist         General transfer comment: recommeded [facility] for nursing     Balance Overall balance assessment: Needs assistance Sitting-balance support: Feet supported;Single extremity supported Sitting balance-Leahy Scale: Poor   Postural control: Posterior lean;Left lateral lean Standing balance support: Reliant on assistive device for balance;Bilateral upper extremity supported;Single extremity supported Standing balance-Leahy Scale: Zero                             ADL either performed or assessed with clinical judgement   ADL       Grooming: Wash/dry hands;Wash/dry face;Oral care;Minimal assistance;Sitting Grooming Details (indicate cue type and reason): increased time and balance support throughout         Upper Body Dressing : Moderate assistance;Sitting   Lower Body Dressing: Maximal assistance;Sitting/lateral leans Lower Body Dressing Details (indicate cue type and reason): poor sitting balance, increased pain at hip and shoulder limiting mobility               General ADL Comments: patient declining with sitting tolerance and balance                       Cognition Arousal/Alertness: Awake/alert Behavior During Therapy: WFL for tasks assessed/performed Overall Cognitive Status: Within Functional Limits for tasks assessed  Pertinent Vitals/ Pain       Pain Assessment: Faces Faces Pain Scale: Hurts even more Pain Location: L hip, R shoulder, back Pain Descriptors / Indicators: Guarding;Grimacing;Moaning                                                          Frequency  Min  2X/week        Progress Toward Goals  OT Goals(current goals can now be found in the care plan section)  Progress towards OT goals: Progressing toward goals  Acute Rehab OT Goals Patient Stated Goal: Get home eventually OT Goal Formulation: With patient Time For Goal Achievement: 04/08/21 Potential to Achieve Goals: Good  Plan Discharge plan needs to be updated    Co-evaluation                 AM-PAC OT "6 Clicks" Daily Activity     Outcome Measure   Help from another person eating meals?: A Little Help from another person taking care of personal grooming?: A Little Help from another person toileting, which includes using toliet, bedpan, or urinal?: A Lot Help from another person bathing (including washing, rinsing, drying)?: A Lot Help from another person to put on and taking off regular upper body clothing?: A Lot Help from another person to put on and taking off regular lower body clothing?: Total 6 Click Score: 13    End of Session Equipment Utilized During Treatment: Rolling walker (2 wheels);Gait belt  OT Visit Diagnosis: Unsteadiness on feet (R26.81);Other abnormalities of gait and mobility (R26.89);Muscle weakness (generalized) (M62.81);History of falling (Z91.81);Pain Pain - Right/Left: Right Pain - part of body: Shoulder;Hip   Activity Tolerance Patient limited by fatigue;Patient limited by pain   Patient Left in chair;with call bell/phone within reach;with family/visitor present   Nurse Communication Mobility status        Time: [id number] OT Time Calculation (min): 38 min  Charges: OT Treatments $Self Care/Home Management : 38-52 mins  03/30/2021  RP, OTR/L  Acute Rehabilitation Services  Office:  [place] 03/30/2021, 11:20 AM

## 2021-03-30 NOTE — TOC Progression Note (Signed)
Transition of Care [facility]) - Progression Note    Patient Details  Name: Eugene [name] MRN: [id number] Date of Birth: [birth year]  Transition of Care [facility]) CM/SW Contact  Ivette Loyal, [place] Phone Number: 03/30/2021, 4:33 PM  Clinical Narrative:    CSW spoke with pt son to confirm bed placement at [facility]. CSW will continue to follow pt for DC planning needs.   Expected Discharge Plan: Home w Home Health Services    Expected Discharge Plan and Services Expected Discharge Plan: Home w Home Health Services   Discharge Planning Services: CM Consult Post Acute Care Choice: Durable Medical Equipment Living arrangements for the past 2 months: Single Family Home                           HH Arranged: PT           Social Determinants of Health (SDOH) Interventions    Readmission Risk Interventions No flowsheet data found.

## 2021-03-30 NOTE — Progress Notes (Signed)
PA for orthopedics Casimiro Needle made aware of meds arrival to unit per care order, according to him, he will administer med in the morning, 11/4.

## 2021-03-30 NOTE — Consult Note (Signed)
Reason for Consult:Right shoulder pain Referring Physician: Phillips Climes Time called: 1320 Time at bedside: Eugene [name] is an 85 y.o. male.  HPI: Eugene [name] was admitted about a week ago with generalized weakness and multiple falls. He suffered a greater trochanter fx on the left side. His right shoulder was also hurting but x-rays were negative for fx. He has had significant trouble working with therapy in part 2/2 continued right shoulder pain. MRI was obtained that showed a supraspinatus tendiosis and orthopedic surgery was reconsulted for consideration of steroid injection. Pt is amenable and would like to proceed.  Past Medical History:  Diagnosis Date   AAA (abdominal aortic aneurysm) 06/29/2007   stent graft   Aortic stenosis 09/05/2017   Mild AS/AR by echo 01/2017.   GERD (gastroesophageal reflux disease)    Hemochromatosis    Hyperlipidemia    Hypertension    Irritable bowel syndrome    Permanent atrial fibrillation (HCC)    Stroke (HCC)    Transient Ischemic Attack    Past Surgical History:  Procedure Laterality Date   ABDOMINAL AORTIC ANEURYSM REPAIR     ENDOVASCULAR STENT INSERTION  07/18/2007   saccular infrarenal aortic aneurysm   EYE SURGERY Bilateral    Cataract     IR FLUORO GUIDE CV LINE LEFT  12/08/2018   IR FLUORO GUIDE CV LINE RIGHT  02/13/2017   IR FLUORO GUIDE CV LINE RIGHT  02/25/2017   IR US GUIDE VASC ACCESS RIGHT  02/13/2017   PICC LINE PLACE PERIPHERAL ([place] [facility]X)  Oct 21, 2014    Family History  Problem Relation Age of Onset   Hypertension Mother    Heart disease Mother        AAA, he passed in his 42 s of Heart Diseasse   Heart disease Father        AAA history and he passed at age 74  of   Disease    Social History:  reports that he quit smoking about 21 years ago. His smoking use included cigarettes. He has never used smokeless tobacco. He reports current alcohol use of about 1.0 - 2.0 standard drink per week. He reports that he does  not use drugs.  Allergies:  Allergies  Allergen Reactions   Chlorhexidine Gluconate Itching    Don't use Chloraprep inside port a cath kits.   Macrolides And Ketolides Nausea And Vomiting   Tape     Adhesive tape,   Only use paper tape    Medications: I have reviewed the patient's current medications.  Results for orders placed or performed during the hospital encounter of 03/24/21 (from the past 48 hour(s))  Sodium     Status: Abnormal   Collection Time: 03/28/21  3:20 PM  Result Value Ref Range   Sodium 126 (L) 135 - 145 mmol/L    Comment: Performed at [facility], [place] [address] [place], [place] [address]  Sodium     Status: Abnormal   Collection Time: 03/28/21  8:56 PM  Result Value Ref Range   Sodium 128 (L) 135 - 145 mmol/L    Comment: Performed at [facility], [place] [address], [place], [place] [address]  Comprehensive metabolic panel     Status: Abnormal   Collection Time: 03/29/21  3:33 AM  Result Value Ref Range   Sodium 128 (L) 135 - 145 mmol/L   Potassium 3.9 3.5 - 5.1 mmol/L   Chloride 96 (L) 98 - 111 mmol/L  CO2 24 22 - 32 mmol/L   Glucose, Bld 112 (H) 70 - 99 mg/dL    Comment: Glucose reference range applies only to samples taken after fasting for at least 8 hours.   BUN 44 (H) 8 - 23 mg/dL   Creatinine, Ser 3.22 0.61 - 1.24 mg/dL   Calcium 8.9 8.9 - 02.5 mg/dL   Total Protein 5.8 (L) 6.5 - 8.1 g/dL   Albumin 2.7 (L) 3.5 - 5.0 g/dL   AST 53 (H) 15 - 41 U/L   ALT 60 (H) 0 - 44 U/L   Alkaline Phosphatase 50 38 - 126 U/L   Total Bilirubin 1.2 0.3 - 1.2 mg/dL   GFR, Estimated 58 (L) >60 mL/min    Comment: (NOTE) Calculated using the CKD-EPI Creatinine Equation (2021)    Anion gap 8 5 - 15    Comment: Performed at [facility], [address] [place], [place] [address]  Brain natriuretic peptide     Status: Abnormal   Collection Time: 03/29/21  3:33 AM  Result Value Ref Range   B Natriuretic Peptide 380.7 (H) 0.0 - 100.0 pg/mL     Comment: Performed at [facility], [address] [place], [place] [address]  CBC with Differential/Platelet     Status: Abnormal   Collection Time: 03/29/21  3:33 AM  Result Value Ref Range   WBC 8.2 4.0 - 10.5 K/uL   RBC 3.14 (L) 4.22 - 5.81 MIL/uL   Hemoglobin 10.6 (L) 13.0 - 17.0 g/dL   HCT 83.1 (L) 51.7 - 61.6 %   MCV 95.9 80.0 - 100.0 fL   MCH 33.8 26.0 - 34.0 pg   MCHC 35.2 30.0 - 36.0 g/dL   RDW 07.3 71.0 - 62.6 %   Platelets 258 150 - 400 K/uL   nRBC 0.0 0.0 - 0.2 %   Neutrophils Relative % 71 %   Neutro Abs 5.8 1.7 - 7.7 K/uL   Lymphocytes Relative 13 %   Lymphs Abs 1.0 0.7 - 4.0 K/uL   Monocytes Relative 14 %   Monocytes Absolute 1.2 (H) 0.1 - 1.0 K/uL   Eosinophils Relative 1 %   Eosinophils Absolute 0.1 0.0 - 0.5 K/uL   Basophils Relative 0 %   Basophils Absolute 0.0 0.0 - 0.1 K/uL   Immature Granulocytes 1 %   Abs Immature Granulocytes 0.11 (H) 0.00 - 0.07 K/uL    Comment: Performed at [facility], [address], [place], [place] [address]  Magnesium     Status: None   Collection Time: 03/29/21  3:33 AM  Result Value Ref Range   Magnesium 2.0 1.7 - 2.4 mg/dL    Comment: Performed at [facility], [address], [place], [place] [address]  Sodium     Status: Abnormal   Collection Time: 03/29/21 12:54 PM  Result Value Ref Range   Sodium 129 (L) 135 - 145 mmol/L    Comment: Performed at [facility], [address] [place], [place] [address]  Comprehensive metabolic panel     Status: Abnormal   Collection Time: 03/30/21  2:54 AM  Result Value Ref Range   Sodium 130 (L) 135 - 145 mmol/L   Potassium 4.0 3.5 - 5.1 mmol/L   Chloride 98 98 - 111 mmol/L   CO2 26 22 - 32 mmol/L   Glucose, Bld 116 (H) 70 - 99 mg/dL    Comment: Glucose reference range applies only to samples  taken after fasting for at least 8 hours.   BUN 83 (H) 8 - 23 mg/dL   Creatinine, Ser 1.611.19 0.61 - 1.24 mg/dL   Calcium 9.1 8.9 - 09.610.3 mg/dL   Total Protein 5.6 (L)  6.5 - 8.1 g/dL   Albumin 2.6 (L) 3.5 - 5.0 g/dL   AST 045144 (H) 15 - 41 U/L   ALT 143 (H) 0 - 44 U/L   Alkaline Phosphatase 57 38 - 126 U/L   Total Bilirubin 1.1 0.3 - 1.2 mg/dL   GFR, Estimated >40>60 >98>60 mL/min    Comment: (NOTE) Calculated using the CKD-EPI Creatinine Equation (2021)    Anion gap 6 5 - 15    Comment: Performed at [facility], [address] [place], [place] [address]  Brain natriuretic peptide     Status: Abnormal   Collection Time: 03/30/21  2:54 AM  Result Value Ref Range   B Natriuretic Peptide 485.6 (H) 0.0 - 100.0 pg/mL    Comment: Performed at [facility], [address] [place], [place] [address]  CBC with Differential/Platelet     Status: Abnormal   Collection Time: 03/30/21  2:54 AM  Result Value Ref Range   WBC 7.5 4.0 - 10.5 K/uL   RBC 3.12 (L) 4.22 - 5.81 MIL/uL   Hemoglobin 10.5 (L) 13.0 - 17.0 g/dL   HCT 62.129.7 (L) 30.839.0 - 65.752.0 %   MCV 95.2 80.0 - 100.0 fL   MCH 33.7 26.0 - 34.0 pg   MCHC 35.4 30.0 - 36.0 g/dL   RDW 84.613.7 96.211.5 - 95.215.5 %   Platelets 294 150 - 400 K/uL   nRBC 0.0 0.0 - 0.2 %   Neutrophils Relative % 70 %   Neutro Abs 5.2 1.7 - 7.7 K/uL   Lymphocytes Relative 13 %   Lymphs Abs 1.0 0.7 - 4.0 K/uL   Monocytes Relative 13 %   Monocytes Absolute 1.0 0.1 - 1.0 K/uL   Eosinophils Relative 2 %   Eosinophils Absolute 0.1 0.0 - 0.5 K/uL   Basophils Relative 0 %   Basophils Absolute 0.0 0.0 - 0.1 K/uL   Immature Granulocytes 2 %   Abs Immature Granulocytes 0.12 (H) 0.00 - 0.07 K/uL    Comment: Performed at [facility], [address] [place], [place] [address]  Magnesium     Status: None   Collection Time: 03/30/21  2:54 AM  Result Value Ref Range   Magnesium 2.1 1.7 - 2.4 mg/dL    Comment: Performed at [facility], [address] [place], [place] [address]    MR SHOULDER RIGHT WO CONTRAST  Result Date: 03/29/2021 CLINICAL DATA:  Right shoulder pain and limited range of motion. EXAM: MRI OF THE RIGHT  SHOULDER WITHOUT CONTRAST TECHNIQUE: Multiplanar, multisequence MR imaging of the shoulder was performed. No intravenous contrast was administered. COMPARISON:  Right shoulder x-ray dated March 24, 2021. FINDINGS: Limited study due to required protocol modifications related to patient's EVAR stent graft. Fluid sensitive sequences could not be obtained. Rotator cuff: Severe supraspinatus tendinosis with fraying at the insertion but no discrete tear. Intact infraspinatus, subscapularis, and teres minor tendons. Muscles: No atrophy or abnormal signal of the muscles of the rotator cuff. Biceps long head:  Intact and normally positioned. Acromioclavicular Joint: Moderate arthropathy of the acromioclavicular joint. Type I acromion. No significant subacromial/subdeltoid bursal fluid. Glenohumeral Joint: No joint effusion. No chondral defect. Labrum: Grossly intact, but evaluation is limited by lack  of intraarticular fluid. Bones:  No marrow abnormality, fracture or dislocation. Other: None. IMPRESSION: 1. Limited study due to required protocol modifications related to patient's EVAR stent graft. 2. Severe supraspinatus tendinosis with fraying at the insertion but no discrete tear. 3. Moderate acromioclavicular osteoarthritis. Electronically Signed   By: Titus Dubin M.D.   On: 03/29/2021 10:16    Review of Systems  Constitutional:  Positive for fatigue.  HENT:  Negative for ear discharge, ear pain, hearing loss and tinnitus.   Eyes:  Negative for photophobia and pain.  Respiratory:  Negative for cough and shortness of breath.   Cardiovascular:  Negative for chest pain.  Gastrointestinal:  Negative for abdominal pain, nausea and vomiting.  Genitourinary:  Negative for dysuria, flank pain, frequency and urgency.  Musculoskeletal:  Positive for arthralgias (Right shoulder). Negative for back pain, myalgias and neck pain.  Neurological:  Positive for weakness. Negative for dizziness and headaches.   Hematological:  Does not bruise/bleed easily.  Psychiatric/Behavioral:  The patient is not nervous/anxious.   Blood pressure 94/60, pulse 94, temperature (!) 97 F (36.1 C), temperature source Axillary, resp. rate 17, height 6\' 1"  (1.854 m), weight 84.3 kg, SpO2 100 %. Physical Exam Constitutional:      General: He is not in acute distress.    Appearance: He is well-developed. He is not diaphoretic.  HENT:     Head: Normocephalic and atraumatic.  Eyes:     General: No scleral icterus.       Right eye: No discharge.        Left eye: No discharge.     Conjunctiva/sclera: Conjunctivae normal.  Cardiovascular:     Rate and Rhythm: Normal rate and regular rhythm.  Pulmonary:     Effort: Pulmonary effort is normal. No respiratory distress.  Musculoskeletal:     Cervical back: Normal range of motion.     Comments: Right shoulder, elbow, wrist, digits- no skin wounds, motion severely limited by pain, no instability, no blocks to motion  Sens  Ax/R/M/U intact  Mot   Ax/ R/ PIN/ M/ AIN/ U intact  Rad 2+  Skin:    General: Skin is warm and dry.  Neurological:     Mental Status: He is alert.  Psychiatric:        Mood and Affect: Mood normal.        Behavior: Behavior normal.    Assessment/Plan: Right shoulder tendinosis -- Will plan subacromial steroid injection either this afternoon or tomorrow morning.    Lisette Abu, PA-C Orthopedic Surgery [phone] 03/30/2021, 2:08 PM

## 2021-03-30 NOTE — Progress Notes (Signed)
PROGRESS NOTE                                                                                                                                                                                                             Patient Demographics:    Eugene [name], is a 85 y.o. male, DOB - 1936-03-06, [id number]  Outpatient Primary MD for the patient is [name], MD    LOS - 6  Admit date - 03/24/2021    Chief Complaint  Patient presents with   Weakness       Brief Narrative (HPI from H&P)     Eugene [name] is a 85 y.o. male with medical history significant of permanent A. fib on Eliquis, mild aortic stenosis/regurgitation, hypertension, hyperlipidemia, CVA/TIA, IBS, hemochromatosis followed by [name], GERD, AAA status post repair presented to the ED complaining of generalized weakness, multiple falls, and acute onset left hip pain.  He presented to the ER with generalized weakness and ongoing left hip pain was diagnosed with severe dehydration, hypokalemia, hypomagnesemia, left greater trochanteric fracture, renal and orthopedics were consulted and he was admitted to the hospital.   Subjective:   Patient in bed, he still reports left shoulder, left hip pain, reports he is feeling weak, and frail.  .    Assessment  & Plan :    Severe Hyponatremia, Hypotension, Dehydration - multiple falls -thought initially to be secondary to volume depletion, from dehydration and thiazide use, but remains with hyponatremia with correction of volume status, at that plateau, with urine sodium of 47, some suggestion of SIADH, so improving on urea and tolvaptan currently, management per renal.   Multiple falls with left greater trochanteric fracture, hip pain.   -Management per orthopedic, nonoperative management, discussed with Dr. Stann Mainland 11/3 given severe left hip pain, patient with severe underlying left hip osteoarthritis,  so certainly some baseline pain in the setting of such severe osteoarthritis, this is complicated by greater trochanteric fracture, which prohibits up replacement at this point, and current recommendation is for PT/OT and pain control . -Patient with significant left hip area bruising, this seems to be stable at demarcated line, H&H is stable. -Patient with right  shoulder Severe supraspinatus tendinosis , patient may benefit from steroid injection, have discussed with orthopedic, who will reassess him for that..  HTN.  Blood pressure  is low, blood pressure medications held.  Hypokalemia and hypomagnesemia.  Replace and monitor.  Permanent A. fib with [place] vas 2 score of 3.  Continue Eliquis with caution, beta-blocker if blood pressure tolerates, post 2 doses of digoxin on 03/25/21 and repeat 1 dose on 03/28/21 for short-term for rate control, amiodarone if all fails.  Dyslipidemia.  On statin.  History of hemochromatosis.  Outpatient follow-up.  Anxiety and depression.  Trazodone.  Elevated BUN: -Urea dose was decreased by pulmonary  Elevated LFTs -Continue to monitor, will hold statin        Condition - Fair  Family Communication  :    [name] [phone] 03/27/21, 03/29/21  wife [name] [phone] - on 03/25/21 @ 9.21 - message left.  Code Status :  Full  Consults  :  Renal, Ortho  PUD Prophylaxis :    Procedures  :     MRI R Shoulder - 1. Limited study due to required protocol modifications related to patient's EVAR stent graft. 2. Severe supraspinatus tendinosis with fraying at the insertion but no discrete tear. 3. Moderate acromioclavicular osteoarthritis  CT - Head and C Spine - 1. No acute intracranial or calvarial findings. 2. Atrophy and mild chronic small vessel ischemic changes. 3. No evidence of acute cervical spine fracture, traumatic subluxation or static signs of instability. 4. Multilevel cervical spondylosis.      Disposition Plan  :    Status  is: Inpatient  Remains inpatient appropriate because: Severe Hyponatremia, left hip fracture, right shoulder injury >> SNF.  DVT Prophylaxis  :    apixaban (ELIQUIS) tablet 5 mg    Lab Results  Component Value Date   PLT 294 03/30/2021    Diet :  Diet Order             Diet regular Room service appropriate? Yes; Fluid consistency: Thin; Fluid restriction: 1800 mL Fluid  Diet effective now                    Inpatient Medications  Scheduled Meds:  ALPRAZolam  0.5 mg Oral BID   apixaban  5 mg Oral BID   atorvastatin  20 mg Oral Daily   diclofenac Sodium  2 g Topical TID   metoprolol tartrate  25 mg Oral BID   polyethylene glycol  17 g Oral BID   urea  30 g Oral BID   Continuous Infusions:   PRN Meds:.acetaminophen, HYDROcodone-acetaminophen, metoprolol tartrate, traMADol, traZODone  Antibiotics  :    Anti-infectives (From admission, onward)    None       Time Spent in minutes  47   Phillips Climes M.D on 03/30/2021 at 1:21 PM  To page go to [email or web address]   [facility] -  Office  [phone]  See all Orders from today for further details    Objective:   Vitals:   03/29/21 1124 03/29/21 1949 03/30/21 0357 03/30/21 1124  BP: 110/69 113/69 96/62 94/60   Pulse: 90 95 88 94  Resp: 18 20 20 17   Temp: 97.8 F (36.6 C) 97.7 F (36.5 C) 97.6 F (36.4 C) (!) 97 F (36.1 C)  TempSrc: Oral Oral Oral Axillary  SpO2: 96% 93% 93% 100%  Weight:   84.3 kg   Height:        Wt Readings from Last 3 Encounters:  03/30/21 84.3 kg  02/15/21 88.2 kg  05/16/20 87.9 kg     Intake/Output Summary (Last 24 hours) at 03/30/2021 1321 Last data filed  at 03/30/2021 0851 Gross per 24 hour  Intake 660 ml  Output 2450 ml  Net -1790 ml     Physical Exam  Awake Alert, Oriented X 3, extremely frail, deconditioned Symmetrical Chest wall movement, Good air movement bilaterally, CTAB RRR,No Gallops,Rubs or new Murmurs, No Parasternal Heave +ve B.Sounds,  Abd Soft, No tenderness, No rebound - guarding or rigidity. No Cyanosis, Clubbing or edema, patient with significant bruising left hip area, remains within demarcated lines, he is having limited active range of motion in the left upper shoulder.     Data Review:    CBC Recent Labs  Lab 03/26/21 0114 03/27/21 0337 03/28/21 0350 03/29/21 0333 03/30/21 0254  WBC 7.5 7.7 9.1 8.2 7.5  HGB 10.1* 10.0* 10.6* 10.6* 10.5*  HCT 27.7* 28.6* 29.2* 30.1* 29.7*  PLT 189 207 253 258 294  MCV 93.3 95.7 94.2 95.9 95.2  MCH 34.0 33.4 34.2* 33.8 33.7  MCHC 36.5* 35.0 36.3* 35.2 35.4  RDW 13.6 13.7 13.7 13.9 13.7  LYMPHSABS 0.5* 0.8 0.9 1.0 1.0  MONOABS 0.8 1.0 1.1* 1.2* 1.0  EOSABS 0.0 0.1 0.1 0.1 0.1  BASOSABS 0.0 0.0 0.0 0.0 0.0    Recent Labs  Lab 03/24/21 1700 03/24/21 2227 03/25/21 0308 03/25/21 0853 03/26/21 0114 03/26/21 1014 03/26/21 1631 03/27/21 0337 03/27/21 0837 03/28/21 0350 03/28/21 0831 03/28/21 1520 03/28/21 2056 03/29/21 0333 03/29/21 1254 03/30/21 0254  NA 119*   < > 119*   < > 122*  122*   < > 124* 123*   < > 125*   < > 126* 128* 128* 129* 130*  K 3.1*   < > 3.1*   < > 3.9  3.9   < > 4.0 3.7  --  3.7  --   --   --  3.9  --  4.0  CL 82*   < > 85*   < > 93*  92*   < > 91* 93*  --  93*  --   --   --  96*  --  98  CO2 22   < > 23   < > 22  22   < > 25 23  --  23  --   --   --  24  --  26  GLUCOSE 112*   < > 101*   < > 112*  112*   < > 112* 106*  --  109*  --   --   --  112*  --  116*  BUN 16   < > 15   < > 13  12   < > 12 11  --  49*  --   --   --  44*  --  83*  CREATININE 1.07   < > 1.02   < > 1.01  1.04   < > 1.22 1.00  --  1.02  --   --   --  1.23  --  1.19  CALCIUM 9.3   < > 8.6*   < > 8.4*  8.4*   < > 8.6* 8.4*  --  9.0  --   --   --  8.9  --  9.1  AST 65*  --   --   --  34  --   --  24  --  32  --   --   --  53*  --  144*  ALT 32  --   --   --  24  --   --  25  --  36  --   --   --  60*  --  143*  ALKPHOS 52  --   --   --  45  --   --  43  --  47   --   --   --  50  --  57  BILITOT 3.3*  --   --   --  1.7*  --   --  1.6*  --  1.7*  --   --   --  1.2  --  1.1  ALBUMIN 3.7  --   --   --  2.9*  --  3.2* 2.7*  --  2.9*  --   --   --  2.7*  --  2.6*  MG 1.6*  --  1.9  --  1.9  --   --  1.8  --  1.9  --   --   --  2.0  --  2.1  INR 1.3*  --   --   --   --   --   --   --   --   --   --   --   --   --   --   --   TSH  --   --  1.160  --   --   --   --   --   --   --   --   --   --   --   --   --   BNP  --   --   --   --  218.4*  --   --  519.8*  --  621.6*  --   --   --  380.7*  --  485.6*   < > = values in this interval not displayed.    ------------------------------------------------------------------------------------------------------------------ No results for input(s): CHOL, HDL, LDLCALC, TRIG, CHOLHDL, LDLDIRECT in the last 72 hours.  No results found for: HGBA1C ------------------------------------------------------------------------------------------------------------------ No results for input(s): TSH, T4TOTAL, T3FREE, THYROIDAB in the last 72 hours.  Invalid input(s): FREET3   Cardiac Enzymes No results for input(s): CKMB, TROPONINI, MYOGLOBIN in the last 168 hours.  Invalid input(s): CK ------------------------------------------------------------------------------------------------------------------    Component Value Date/Time   BNP 485.6 (H) 03/30/2021 0254     Radiology Reports DG Chest 2 View  Result Date: 03/24/2021 CLINICAL DATA:  Weakness. EXAM: CHEST - 2 VIEW COMPARISON:  Radiographs 05/26/2019.  CT 11/06/2005. FINDINGS: The heart size and mediastinal contours are stable with aortic atherosclerosis. There is mild left basilar atelectasis. The lungs otherwise appear clear, without confluent airspace opacity, edema or pleural effusion. No acute fractures are seen. There are mild degenerative changes in the spine. IMPRESSION: No evidence of active cardiopulmonary process. Electronically Signed   By: Richardean Sale  M.D.   On: 03/24/2021 15:33   DG Shoulder Right  Result Date: 03/24/2021 CLINICAL DATA:  Right shoulder pain after fall. EXAM: RIGHT SHOULDER - 2+ VIEW COMPARISON:  Radiograph 04/05/2020 FINDINGS: There is no evidence of fracture or dislocation. Moderate acromioclavicular degenerative change. Small subacromial spur. There is soft tissue calcifications adjacent to the lateral humeral head, not seen on prior exam. No erosion or bone destruction. IMPRESSION: 1. No fracture or dislocation of the right shoulder. 2. Soft tissue calcifications adjacent to the lateral humeral head, new from prior exam, may represent calcific tendinopathy or bursitis. Electronically Signed   By: Keith Rake M.D.   On: 03/24/2021 21:50   CT HEAD  WO CONTRAST ( )  Result Date: 03/24/2021 CLINICAL DATA:  Generalized weakness that started 1 week ago. Multiple falls. EXAM: CT HEAD WITHOUT CONTRAST CT CERVICAL SPINE WITHOUT CONTRAST TECHNIQUE: Multidetector CT imaging of the head and cervical spine was performed following the standard protocol without intravenous contrast. Multiplanar CT image reconstructions of the cervical spine were also generated. COMPARISON:  None. FINDINGS: CT HEAD FINDINGS Brain: There is no evidence of acute intracranial hemorrhage, mass lesion, brain edema or extra-axial fluid collection. Mild atrophy with prominence of the ventricles and subarachnoid spaces. The dilatation of the lateral and 3rd ventricles is in slight excess of the sulcal prominence, but there is no definite hydrocephalus. Mild patchy low-density in the periventricular white matter likely reflects chronic small vessel ischemic changes. There is no CT evidence of acute cortical infarction. Vascular: Intracranial vascular calcifications. No hyperdense vessel identified. Skull: Negative for fracture or focal lesion. Sinuses/Orbits: The visualized paranasal sinuses and mastoid air cells are clear. No orbital abnormalities are seen. Other:  None. CT CERVICAL SPINE FINDINGS Alignment: Normal. Skull base and vertebrae: No evidence of acute cervical spine fracture or traumatic subluxation. Soft tissues and spinal canal: No prevertebral fluid or swelling. No visible canal hematoma. There is synovial calcifications surrounding the odontoid process without osseous erosion or mass effect on the craniocervical junction. Disc levels: Multilevel cervical spondylosis with disc bulging, uncinate spurring and facet hypertrophy. There is mild to moderate resulting osseous foraminal narrowing which appears worst at the C4-5 and C6-7 levels. Upper chest: Emphysematous changes and mild scarring at both lung apices. Severe bilateral carotid atherosclerosis. Other: None. IMPRESSION: 1. No acute intracranial or calvarial findings. 2. Atrophy and mild chronic small vessel ischemic changes. 3. No evidence of acute cervical spine fracture, traumatic subluxation or static signs of instability. 4. Multilevel cervical spondylosis. Electronically Signed   By: Carey Bullocks M.D.   On: 03/24/2021 15:59   CT Cervical Spine Wo Contrast  Result Date: 03/24/2021 CLINICAL DATA:  Generalized weakness that started 1 week ago. Multiple falls. EXAM: CT HEAD WITHOUT CONTRAST CT CERVICAL SPINE WITHOUT CONTRAST TECHNIQUE: Multidetector CT imaging of the head and cervical spine was performed following the standard protocol without intravenous contrast. Multiplanar CT image reconstructions of the cervical spine were also generated. COMPARISON:  None. FINDINGS: CT HEAD FINDINGS Brain: There is no evidence of acute intracranial hemorrhage, mass lesion, brain edema or extra-axial fluid collection. Mild atrophy with prominence of the ventricles and subarachnoid spaces. The dilatation of the lateral and 3rd ventricles is in slight excess of the sulcal prominence, but there is no definite hydrocephalus. Mild patchy low-density in the periventricular white matter likely reflects chronic small  vessel ischemic changes. There is no CT evidence of acute cortical infarction. Vascular: Intracranial vascular calcifications. No hyperdense vessel identified. Skull: Negative for fracture or focal lesion. Sinuses/Orbits: The visualized paranasal sinuses and mastoid air cells are clear. No orbital abnormalities are seen. Other: None. CT CERVICAL SPINE FINDINGS Alignment: Normal. Skull base and vertebrae: No evidence of acute cervical spine fracture or traumatic subluxation. Soft tissues and spinal canal: No prevertebral fluid or swelling. No visible canal hematoma. There is synovial calcifications surrounding the odontoid process without osseous erosion or mass effect on the craniocervical junction. Disc levels: Multilevel cervical spondylosis with disc bulging, uncinate spurring and facet hypertrophy. There is mild to moderate resulting osseous foraminal narrowing which appears worst at the C4-5 and C6-7 levels. Upper chest: Emphysematous changes and mild scarring at both lung apices. Severe bilateral carotid atherosclerosis. Other: None.  IMPRESSION: 1. No acute intracranial or calvarial findings. 2. Atrophy and mild chronic small vessel ischemic changes. 3. No evidence of acute cervical spine fracture, traumatic subluxation or static signs of instability. 4. Multilevel cervical spondylosis. Electronically Signed   By: Richardean Sale M.D.   On: 03/24/2021 15:59   MR SHOULDER RIGHT WO CONTRAST  Result Date: 03/29/2021 CLINICAL DATA:  Right shoulder pain and limited range of motion. EXAM: MRI OF THE RIGHT SHOULDER WITHOUT CONTRAST TECHNIQUE: Multiplanar, multisequence MR imaging of the shoulder was performed. No intravenous contrast was administered. COMPARISON:  Right shoulder x-ray dated March 24, 2021. FINDINGS: Limited study due to required protocol modifications related to patient's EVAR stent graft. Fluid sensitive sequences could not be obtained. Rotator cuff: Severe supraspinatus tendinosis with  fraying at the insertion but no discrete tear. Intact infraspinatus, subscapularis, and teres minor tendons. Muscles: No atrophy or abnormal signal of the muscles of the rotator cuff. Biceps long head:  Intact and normally positioned. Acromioclavicular Joint: Moderate arthropathy of the acromioclavicular joint. Type I acromion. No significant subacromial/subdeltoid bursal fluid. Glenohumeral Joint: No joint effusion. No chondral defect. Labrum: Grossly intact, but evaluation is limited by lack of intraarticular fluid. Bones:  No marrow abnormality, fracture or dislocation. Other: None. IMPRESSION: 1. Limited study due to required protocol modifications related to patient's EVAR stent graft. 2. Severe supraspinatus tendinosis with fraying at the insertion but no discrete tear. 3. Moderate acromioclavicular osteoarthritis. Electronically Signed   By: Titus Dubin M.D.   On: 03/29/2021 10:16   DG Hip Unilat W or Wo Pelvis 2-3 Views Left  Result Date: 03/24/2021 CLINICAL DATA:  Weakness. EXAM: DG HIP (WITH OR WITHOUT PELVIS) 2-3V LEFT COMPARISON:  Abdominal radiographs 08/02/2016. Abdominopelvic CT 03/09/2013. FINDINGS: The mineralization and alignment are normal. There are advanced degenerative changes of both hips which have progressed compared with the prior studies. There are associated osteophytes, subchondral cysts and subchondral sclerosis. There is a nondisplaced fracture of the left greater trochanter. No definite extension of this fracture cross the femoral neck or into the intertrochanteric region is identified. There are diffuse vascular calcifications status post aorto bi-iliac stent grafting. Patient is status post ventral hernia repair. IMPRESSION: 1. Acute fracture of the left greater trochanter. If the patient is unable to bear weight or there is additional clinical concern for intertrochanteric extension of this fracture, consider further evaluation with CT. 2. Advanced osteoarthritic changes  of both hips. Electronically Signed   By: Richardean Sale M.D.   On: 03/24/2021 15:37

## 2021-03-30 NOTE — Progress Notes (Signed)
[facility] Progress Note   Subjective:   The patient's feels like he is getting worse.  He just feels more lethargic and does not feel like anything is going in the right direction.  His sodium has improved to 130.  Notably his AST and ALT are rising  Objective Vitals:   03/29/21 1012 03/29/21 1124 03/29/21 1949 03/30/21 0357  BP: 106/65 110/69 113/69 96/62  Pulse: 100 90 95 88  Resp: 16 18 20 20   Temp: 97.6 F (36.4 C) 97.8 F (36.6 C) 97.7 F (36.5 C) 97.6 F (36.4 C)  TempSrc: Oral Oral Oral Oral  SpO2: 97% 96% 93% 93%  Weight:    84.3 kg  Height:       Physical Exam Gen: Lying in bed in no distress Eyes:  anicteric, EOMI, glasses ENT: MMM Neck: supple, no JVD CV: Normal rate Lungs: Bilateral chest rise with no increased work of breathing Extr:  no edema, warm and well perfused  Additional Objective Labs: Basic Metabolic Panel: Recent Labs  Lab 03/26/21 1631 03/27/21 0337 03/28/21 0350 03/28/21 0831 03/29/21 0333 03/29/21 1254 03/30/21 0254  NA 124*   < > 125*   < > 128* 129* 130*  K 4.0   < > 3.7  --  3.9  --  4.0  CL 91*   < > 93*  --  96*  --  98  CO2 25   < > 23  --  24  --  26  GLUCOSE 112*   < > 109*  --  112*  --  116*  BUN 12   < > 49*  --  44*  --  83*  CREATININE 1.22   < > 1.02  --  1.23  --  1.19  CALCIUM 8.6*   < > 9.0  --  8.9  --  9.1  PHOS 2.5  --   --   --   --   --   --    < > = values in this interval not displayed.   Liver Function Tests: Recent Labs  Lab 03/28/21 0350 03/29/21 0333 03/30/21 0254  AST 32 53* 144*  ALT 36 60* 143*  ALKPHOS 47 50 57  BILITOT 1.7* 1.2 1.1  PROT 5.8* 5.8* 5.6*  ALBUMIN 2.9* 2.7* 2.6*   No results for input(s): LIPASE, AMYLASE in the last 168 hours. CBC: Recent Labs  Lab 03/26/21 0114 03/27/21 0337 03/28/21 0350 03/29/21 0333 03/30/21 0254  WBC 7.5 7.7 9.1 8.2 7.5  NEUTROABS 6.2 5.7 7.0 5.8 5.2  HGB 10.1* 10.0* 10.6* 10.6* 10.5*  HCT 27.7* 28.6* 29.2* 30.1* 29.7*  MCV  93.3 95.7 94.2 95.9 95.2  PLT 189 207 253 258 294   Blood Culture No results found for: SDES, SPECREQUEST, CULT, REPTSTATUS  Cardiac Enzymes: No results for input(s): CKTOTAL, CKMB, CKMBINDEX, TROPONINI in the last 168 hours. CBG: Recent Labs  Lab 03/24/21 1731  GLUCAP 120*   Iron Studies: No results for input(s): IRON, TIBC, TRANSFERRIN, FERRITIN in the last 72 hours. @lablastinr3 @ Studies/Results: MR SHOULDER RIGHT WO CONTRAST  Result Date: 03/29/2021 CLINICAL DATA:  Right shoulder pain and limited range of motion. EXAM: MRI OF THE RIGHT SHOULDER WITHOUT CONTRAST TECHNIQUE: Multiplanar, multisequence MR imaging of the shoulder was performed. No intravenous contrast was administered. COMPARISON:  Right shoulder x-ray dated March 24, 2021. FINDINGS: Limited study due to required protocol modifications related to patient's EVAR stent graft. Fluid sensitive sequences could not be obtained. Rotator cuff:  Severe supraspinatus tendinosis with fraying at the insertion but no discrete tear. Intact infraspinatus, subscapularis, and teres minor tendons. Muscles: No atrophy or abnormal signal of the muscles of the rotator cuff. Biceps long head:  Intact and normally positioned. Acromioclavicular Joint: Moderate arthropathy of the acromioclavicular joint. Type I acromion. No significant subacromial/subdeltoid bursal fluid. Glenohumeral Joint: No joint effusion. No chondral defect. Labrum: Grossly intact, but evaluation is limited by lack of intraarticular fluid. Bones:  No marrow abnormality, fracture or dislocation. Other: None. IMPRESSION: 1. Limited study due to required protocol modifications related to patient's EVAR stent graft. 2. Severe supraspinatus tendinosis with fraying at the insertion but no discrete tear. 3. Moderate acromioclavicular osteoarthritis. Electronically Signed   By: Obie Dredge M.D.   On: 03/29/2021 10:16   Medications:   ALPRAZolam  0.5 mg Oral BID   apixaban  5 mg Oral  BID   atorvastatin  20 mg Oral Daily   bisacodyl  10 mg Oral Once   diclofenac Sodium  2 g Topical TID   metoprolol tartrate  25 mg Oral BID   polyethylene glycol  17 g Oral BID   urea  30 g Oral BID    Assessment/Plan **Hyponatremia: Thought to initially be hypovolemic hyponatremia given dehydration and thiazide use but with correction of volume status sodium improved but now plateaued.  Urine sodium 47 and urine osmolality 358 off of fluids and diuretics suggesting that ADH is overactive.  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  Received 15mg  tolvaptan on 11/1 with some improvement.  Sodium is greatly improved now 130.  Decrease urea to 30 g twice daily.  We will need to monitor AST and ALT closely given this could be related to tolvaptan use   **Hypokalemia: Resolved with supplementation  **HTN: on HCTZ, ramipril and metoprolol at home.  BB on here for A fib but others held.  BP on low side.    **Hip fracture:  nonoperative management  **Abnormal LFTs: AST/ALT rising slightly.  Multiple potential causes but do worry about side effect from tolvaptan.  Fortunately the rise is not profound and hopefully will be limited.  We will continue to monitor  **Lethargy: Likely multifactorial.  His BUN is fairly elevated from the use of urea but typically the rise seen from use of this medication does not cause significant uremic symptoms.  We will decrease his urea dose today and continue to monitor.   Will follow closely, call with issues.

## 2021-03-30 NOTE — Plan of Care (Signed)
  Problem: Education: Goal: Knowledge of General Education information will improve Description: Including pain rating scale, medication(s)/side effects and non-pharmacologic comfort measures Outcome: Progressing   Problem: Clinical Measurements: Goal: Ability to maintain clinical measurements within normal limits will improve Outcome: Progressing Goal: Respiratory complications will improve Outcome: Progressing Goal: Cardiovascular complication will be avoided Outcome: Progressing   Problem: Coping: Goal: Level of anxiety will decrease Outcome: Progressing   Problem: Safety: Goal: Ability to remain free from injury will improve Outcome: Progressing   

## 2021-03-31 DIAGNOSIS — R799 Abnormal finding of blood chemistry, unspecified: Secondary | ICD-10-CM

## 2021-03-31 DIAGNOSIS — R7989 Other specified abnormal findings of blood chemistry: Secondary | ICD-10-CM

## 2021-03-31 LAB — BRAIN NATRIURETIC PEPTIDE: B Natriuretic Peptide: 240.7 pg/mL — ABNORMAL HIGH (ref 0.0–100.0)

## 2021-03-31 LAB — CBC WITH DIFFERENTIAL/PLATELET
Abs Immature Granulocytes: 0.11 10*3/uL — ABNORMAL HIGH (ref 0.00–0.07)
Basophils Absolute: 0 10*3/uL (ref 0.0–0.1)
Basophils Relative: 0 %
Eosinophils Absolute: 0.2 10*3/uL (ref 0.0–0.5)
Eosinophils Relative: 4 %
HCT: 29.7 % — ABNORMAL LOW (ref 39.0–52.0)
Hemoglobin: 10.4 g/dL — ABNORMAL LOW (ref 13.0–17.0)
Immature Granulocytes: 2 %
Lymphocytes Relative: 15 %
Lymphs Abs: 1 10*3/uL (ref 0.7–4.0)
MCH: 34.1 pg — ABNORMAL HIGH (ref 26.0–34.0)
MCHC: 35 g/dL (ref 30.0–36.0)
MCV: 97.4 fL (ref 80.0–100.0)
Monocytes Absolute: 0.8 10*3/uL (ref 0.1–1.0)
Monocytes Relative: 12 %
Neutro Abs: 4.5 10*3/uL (ref 1.7–7.7)
Neutrophils Relative %: 67 %
Platelets: 329 10*3/uL (ref 150–400)
RBC: 3.05 MIL/uL — ABNORMAL LOW (ref 4.22–5.81)
RDW: 13.6 % (ref 11.5–15.5)
WBC: 6.7 10*3/uL (ref 4.0–10.5)
nRBC: 0 % (ref 0.0–0.2)

## 2021-03-31 LAB — COMPREHENSIVE METABOLIC PANEL
ALT: 250 U/L — ABNORMAL HIGH (ref 0–44)
AST: 249 U/L — ABNORMAL HIGH (ref 15–41)
Albumin: 2.6 g/dL — ABNORMAL LOW (ref 3.5–5.0)
Alkaline Phosphatase: 70 U/L (ref 38–126)
Anion gap: 9 (ref 5–15)
BUN: 72 mg/dL — ABNORMAL HIGH (ref 8–23)
CO2: 26 mmol/L (ref 22–32)
Calcium: 9.1 mg/dL (ref 8.9–10.3)
Chloride: 96 mmol/L — ABNORMAL LOW (ref 98–111)
Creatinine, Ser: 1.18 mg/dL (ref 0.61–1.24)
GFR, Estimated: >60 mL/min (ref >60)
Glucose, Bld: 103 mg/dL — ABNORMAL HIGH (ref 70–99)
Potassium: 4.3 mmol/L (ref 3.5–5.1)
Sodium: 131 mmol/L — ABNORMAL LOW (ref 135–145)
Total Bilirubin: 1.2 mg/dL (ref 0.3–1.2)
Total Protein: 5.8 g/dL — ABNORMAL LOW (ref 6.5–8.1)

## 2021-03-31 LAB — HEPATITIS PANEL, ACUTE
HCV Ab: NONREACTIVE
Hep A IgM: NONREACTIVE
Hep B C IgM: NONREACTIVE
Hepatitis B Surface Ag: NONREACTIVE

## 2021-03-31 LAB — MAGNESIUM: Magnesium: 2 mg/dL (ref 1.7–2.4)

## 2021-03-31 MED ORDER — UREA 15 G PO PACK
15.0000 g | PACK | Freq: Two times a day (BID) | ORAL | Status: DC
  Administered 2021-03-31 – 2021-04-02 (×5): 15 g via ORAL
  Filled 2021-03-31 (×4): qty 1

## 2021-03-31 NOTE — Progress Notes (Signed)
Pt started eating lunch even though he was aware he was NPO and there was a sign on the door. Radiology made aware. They will complete patient's abdominal ultrasound in the morning. Patient made aware.

## 2021-03-31 NOTE — TOC Progression Note (Signed)
Transition of Care [facility]) - Progression Note    Patient Details  Name: Eugene [name] MRN: [id number] Date of Birth: [birth year]  Transition of Care [facility]) CM/SW Contact  Ivette Loyal, [place] Phone Number: 03/31/2021, 1:29 PM  Clinical Narrative:    Pt is not medically stable for DC, pt will likely DC over the weekend. CSW informed [facility] at [facility] [facility] who is agreeable to take pt over the weekend.   Expected Discharge Plan: Home w Home Health Services    Expected Discharge Plan and Services Expected Discharge Plan: Home w Home Health Services   Discharge Planning Services: CM Consult Post Acute Care Choice: Durable Medical Equipment Living arrangements for the past 2 months: Single Family Home                           HH Arranged: PT           Social Determinants of Health (SDOH) Interventions    Readmission Risk Interventions No flowsheet data found.

## 2021-03-31 NOTE — Progress Notes (Signed)
[facility] Progress Note   Subjective:   Continues to feel fairly poor.  May be energy is slightly better today.  AST/ALT continue to rise.  Sodium is slowly improving at 131.  BUN is improved.  Objective Vitals:   03/31/21 0014 03/31/21 0026 03/31/21 0421 03/31/21 1119  BP:  94/63 (!) 107/55 110/64  Pulse: 65 73 83 82  Resp: 18  18 18   Temp:   97.8 F (36.6 C) 98.5 F (36.9 C)  TempSrc: Oral  Oral Oral  SpO2: 100%  100% 100%  Weight:   82.5 kg   Height:       Physical Exam Gen: Lying in bed in no distress Eyes:  anicteric, EOMI, glasses ENT: MMM Neck: supple, no JVD CV: Normal rate Lungs: Bilateral chest rise with no increased work of breathing Extr:  no edema, warm and well perfused  Additional Objective Labs: Basic Metabolic Panel: Recent Labs  Lab 03/26/21 1631 03/27/21 0337 03/29/21 0333 03/29/21 1254 03/30/21 0254 03/31/21 0406  NA 124*   < > 128* 129* 130* 131*  K 4.0   < > 3.9  --  4.0 4.3  CL 91*   < > 96*  --  98 96*  CO2 25   < > 24  --  26 26  GLUCOSE 112*   < > 112*  --  116* 103*  BUN 12   < > 44*  --  83* 72*  CREATININE 1.22   < > 1.23  --  1.19 1.18  CALCIUM 8.6*   < > 8.9  --  9.1 9.1  PHOS 2.5  --   --   --   --   --    < > = values in this interval not displayed.   Liver Function Tests: Recent Labs  Lab 03/29/21 0333 03/30/21 0254 03/31/21 0406  AST 53* 144* 249*  ALT 60* 143* 250*  ALKPHOS 50 57 70  BILITOT 1.2 1.1 1.2  PROT 5.8* 5.6* 5.8*  ALBUMIN 2.7* 2.6* 2.6*   No results for input(s): LIPASE, AMYLASE in the last 168 hours. CBC: Recent Labs  Lab 03/27/21 0337 03/28/21 0350 03/29/21 0333 03/30/21 0254 03/31/21 0406  WBC 7.7 9.1 8.2 7.5 6.7  NEUTROABS 5.7 7.0 5.8 5.2 4.5  HGB 10.0* 10.6* 10.6* 10.5* 10.4*  HCT 28.6* 29.2* 30.1* 29.7* 29.7*  MCV 95.7 94.2 95.9 95.2 97.4  PLT 207 253 258 294 329   Blood Culture No results found for: SDES, SPECREQUEST, CULT, REPTSTATUS  Cardiac Enzymes: No results  for input(s): CKTOTAL, CKMB, CKMBINDEX, TROPONINI in the last 168 hours. CBG: Recent Labs  Lab 03/24/21 1731  GLUCAP 120*   Iron Studies: No results for input(s): IRON, TIBC, TRANSFERRIN, FERRITIN in the last 72 hours. @lablastinr3 @ Studies/Results: DG Shoulder Left Port  Result Date: 03/30/2021 CLINICAL DATA:  Left shoulder pain EXAM: LEFT SHOULDER COMPARISON:  None. FINDINGS: There is no evidence of fracture or dislocation. There is no evidence of arthropathy or other focal bone abnormality. Soft tissues are unremarkable. Likely vascular calcifications. IMPRESSION: No acute displaced fracture or dislocation. Electronically Signed   By: M.D.   On: 03/30/2021 15:22   Medications:   ALPRAZolam  0.5 mg Oral BID   apixaban  5 mg Oral BID   diclofenac Sodium  2 g Topical TID   metoprolol tartrate  25 mg Oral BID   polyethylene glycol  17 g Oral BID   urea  15 g Oral BID  Assessment/Plan **Hyponatremia: Thought to initially be hypovolemic hyponatremia given dehydration and thiazide use but with correction of volume status sodium improved but now plateaued.  Urine sodium 47 and urine osmolality 358 off of fluids and diuretics suggesting that ADH is overactive.  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  Received 15mg  tolvaptan on 11/1 with some improvement.  Sodium is greatly improved now 131.  Decrease urea to 15 g twice daily.  We will need to monitor AST and ALT closely given this could be related to tolvaptan use; discussed below  **HTN: on HCTZ, ramipril and metoprolol at home.  BB on here for A fib but others held.  BP on low side.    **Hip fracture:  nonoperative management  **Abnormal LFTs: AST/ALT rising steadily.  This could be a side effect from tolvaptan.  Fortunately the rise is not profound and hopefully will be limited.  Rise from tolvaptan is typically with longer term use and often has cholestatic picture which we don't see  here. We will continue to monitor.  **Lethargy: Likely multifactorial.  His BUN is fairly elevated from the use of urea but typically the rise seen from use of this medication does not cause significant uremic symptoms.  We will decrease his urea dose today and continue to monitor.   Will follow closely, call with issues.

## 2021-03-31 NOTE — Progress Notes (Signed)
PROGRESS NOTE                                                                                                                                                                                                             Patient Demographics:    Eugene [name], is a 85 y.o. male, DOB - 1936/01/17, [id number]  Outpatient Primary MD for the patient is [name], MD    LOS - 7  Admit date - 03/24/2021    Chief Complaint  Patient presents with   Weakness       Brief Narrative (HPI from H&P)      Eugene [name] is a 85 y.o. male with medical history significant of permanent A. fib on Eliquis, mild aortic stenosis/regurgitation, hypertension, hyperlipidemia, CVA/TIA, IBS, hemochromatosis followed by [name], GERD, AAA status post repair presented to the ED complaining of generalized weakness, multiple falls, and acute onset left hip pain.  He presented to the ER with generalized weakness and ongoing left hip pain was diagnosed with severe dehydration, hypokalemia, hypomagnesemia, left greater trochanteric fracture, renal and orthopedics were consulted and he was admitted to the hospital.   Subjective:   Patient reports he is feeling more energy today, still complains of pain in left hip, right shoulder, but reports this has been improving, report he had a bowel movement after laxatives yesterday.      Assessment  & Plan :    Severe Hyponatremia, Hypotension, Dehydration  - multiple falls -thought initially to be secondary to volume depletion, from dehydration and thiazide use, but remains with hyponatremia with correction of volume status, at that plateau, with urine sodium of 47, some suggestion of SIADH, so improving on urea and tolvaptan . - management per renal.   Multiple falls with left greater trochanteric fracture, hip pain.   -Management per orthopedic, nonoperative management, discussed with Dr.  Stann Mainland 11/3 given severe left hip pain, patient with severe underlying left hip osteoarthritis, so certainly some baseline pain in the setting of such severe osteoarthritis, this is complicated by greater trochanteric fracture, which prohibits up replacement at this point, and current recommendation is for PT/OT and pain control . -Patient with significant left hip area bruising, this seems to be stable at demarcated line, H&H is stable. -Patient with right  shoulder Severe supraspinatus tendinosis , status post right shoulder  injection by orthopedic.  .  HTN.  Blood pressure is low, blood pressure medications held.  Hypokalemia and hypomagnesemia.  Replace and monitor.  Permanent A. fib with [place] vas 2 score of 3.  Continue Eliquis with caution, beta-blocker if blood pressure tolerates, post 2 doses of digoxin on 03/25/21 and repeat 1 dose on 03/28/21 for short-term for rate control, amiodarone if all fails.  Dyslipidemia.  On statin.  History of hemochromatosis.  Outpatient follow-up.  Anxiety and depression.  Trazodone.  Elevated BUN: -Urea dose was decreased byrenal  Elevated LFTs -LFTs continue to trend up, statin was discontinued yesterday, I will check hepatitis panel, and liver ultrasound,. -Possibly related to tolvaptan, only received couple doses. -New to monitor LFTs closely, statin has been discontinued       Condition - Fair  Family Communication  :   D/W son by phone  Code Status :  Full  Consults  :  Renal, Ortho  PUD Prophylaxis :    Procedures  :     MRI R Shoulder - 1. Limited study due to required protocol modifications related to patient's EVAR stent graft. 2. Severe supraspinatus tendinosis with fraying at the insertion but no discrete tear. 3. Moderate acromioclavicular osteoarthritis  CT - Head and C Spine - 1. No acute intracranial or calvarial findings. 2. Atrophy and mild chronic small vessel ischemic changes. 3. No evidence of acute cervical spine  fracture, traumatic subluxation or static signs of instability. 4. Multilevel cervical spondylosis.      Disposition Plan  :    Status is: Inpatient  Remains inpatient appropriate because: Severe Hyponatremia, left hip fracture, right shoulder injury >> SNF.  DVT Prophylaxis  :    apixaban (ELIQUIS) tablet 5 mg    Lab Results  Component Value Date   PLT 329 03/31/2021    Diet :  Diet Order             Diet regular Room service appropriate? Yes; Fluid consistency: Thin; Fluid restriction: 1800 mL Fluid  Diet effective now                    Inpatient Medications  Scheduled Meds:  ALPRAZolam  0.5 mg Oral BID   apixaban  5 mg Oral BID   diclofenac Sodium  2 g Topical TID   metoprolol tartrate  25 mg Oral BID   polyethylene glycol  17 g Oral BID   urea  15 g Oral BID   Continuous Infusions:   PRN Meds:.HYDROcodone-acetaminophen, metoprolol tartrate, traMADol, traZODone  Antibiotics  :    Anti-infectives (From admission, onward)    None         Phillips Climes M.D on 03/31/2021 at 11:50 AM  To page go to [email or web address]   [facility] -  Office  [phone]  See all Orders from today for further details    Objective:   Vitals:   03/31/21 0014 03/31/21 0026 03/31/21 0421 03/31/21 1119  BP:  94/63 (!) 107/55 110/64  Pulse: 65 73 83 82  Resp: 18  18 18   Temp:   97.8 F (36.6 C) 98.5 F (36.9 C)  TempSrc: Oral  Oral Oral  SpO2: 100%  100% 100%  Weight:   82.5 kg   Height:        Wt Readings from Last 3 Encounters:  03/31/21 82.5 kg  02/15/21 88.2 kg  05/16/20 87.9 kg     Intake/Output Summary (Last 24 hours) at 03/31/2021 1150 Last data  filed at 03/31/2021 1100 Gross per 24 hour  Intake 660 ml  Output 3100 ml  Net -2440 ml     Physical Exam  Awake Alert, Oriented X 3, frail, deconditioned Symmetrical Chest wall movement, Good air movement bilaterally, CTAB RRR,No Gallops,Rubs or new Murmurs, No Parasternal Heave +ve  B.Sounds, Abd Soft, No tenderness, No rebound - guarding or rigidity. No Cyanosis, Clubbing or edema, significant bruising at left hip area.  But remained stable within demarcated lines     Data Review:    CBC Recent Labs  Lab 03/27/21 0337 03/28/21 0350 03/29/21 0333 03/30/21 0254 03/31/21 0406  WBC 7.7 9.1 8.2 7.5 6.7  HGB 10.0* 10.6* 10.6* 10.5* 10.4*  HCT 28.6* 29.2* 30.1* 29.7* 29.7*  PLT 207 253 258 294 329  MCV 95.7 94.2 95.9 95.2 97.4  MCH 33.4 34.2* 33.8 33.7 34.1*  MCHC 35.0 36.3* 35.2 35.4 35.0  RDW 13.7 13.7 13.9 13.7 13.6  LYMPHSABS 0.8 0.9 1.0 1.0 1.0  MONOABS 1.0 1.1* 1.2* 1.0 0.8  EOSABS 0.1 0.1 0.1 0.1 0.2  BASOSABS 0.0 0.0 0.0 0.0 0.0    Recent Labs  Lab 03/24/21 1700 03/24/21 2227 03/25/21 0308 03/25/21 0853 03/27/21 0337 03/27/21 0837 03/28/21 0350 03/28/21 0831 03/28/21 2056 03/29/21 0333 03/29/21 1254 03/30/21 0254 03/31/21 0406  NA 119*   < > 119*   < > 123*   < > 125*   < > 128* 128* 129* 130* 131*  K 3.1*   < > 3.1*   < > 3.7  --  3.7  --   --  3.9  --  4.0 4.3  CL 82*   < > 85*   < > 93*  --  93*  --   --  96*  --  98 96*  CO2 22   < > 23   < > 23  --  23  --   --  24  --  26 26  GLUCOSE 112*   < > 101*   < > 106*  --  109*  --   --  112*  --  116* 103*  BUN 16   < > 15   < > 11  --  49*  --   --  44*  --  83* 72*  CREATININE 1.07   < > 1.02   < > 1.00  --  1.02  --   --  1.23  --  1.19 1.18  CALCIUM 9.3   < > 8.6*   < > 8.4*  --  9.0  --   --  8.9  --  9.1 9.1  AST 65*  --   --    < > 24  --  32  --   --  53*  --  144* 249*  ALT 32  --   --    < > 25  --  36  --   --  60*  --  143* 250*  ALKPHOS 52  --   --    < > 43  --  47  --   --  50  --  57 70  BILITOT 3.3*  --   --    < > 1.6*  --  1.7*  --   --  1.2  --  1.1 1.2  ALBUMIN 3.7  --   --    < > 2.7*  --  2.9*  --   --  2.7*  --  2.6* 2.6*  MG 1.6*  --  1.9   < > 1.8  --  1.9  --   --  2.0  --  2.1 2.0  INR 1.3*  --   --   --   --   --   --   --   --   --   --   --   --   TSH   --   --  1.160  --   --   --   --   --   --   --   --   --   --   BNP  --   --   --    < > 519.8*  --  621.6*  --   --  380.7*  --  485.6* 240.7*   < > = values in this interval not displayed.    ------------------------------------------------------------------------------------------------------------------ No results for input(s): CHOL, HDL, LDLCALC, TRIG, CHOLHDL, LDLDIRECT in the last 72 hours.  No results found for: HGBA1C ------------------------------------------------------------------------------------------------------------------ No results for input(s): TSH, T4TOTAL, T3FREE, THYROIDAB in the last 72 hours.  Invalid input(s): FREET3   Cardiac Enzymes No results for input(s): CKMB, TROPONINI, MYOGLOBIN in the last 168 hours.  Invalid input(s): CK ------------------------------------------------------------------------------------------------------------------    Component Value Date/Time   BNP 240.7 (H) 03/31/2021 0406     Radiology Reports DG Chest 2 View  Result Date: 03/24/2021 CLINICAL DATA:  Weakness. EXAM: CHEST - 2 VIEW COMPARISON:  Radiographs 05/26/2019.  CT 11/06/2005. FINDINGS: The heart size and mediastinal contours are stable with aortic atherosclerosis. There is mild left basilar atelectasis. The lungs otherwise appear clear, without confluent airspace opacity, edema or pleural effusion. No acute fractures are seen. There are mild degenerative changes in the spine. IMPRESSION: No evidence of active cardiopulmonary process. Electronically Signed   By: Richardean Sale M.D.   On: 03/24/2021 15:33   DG Shoulder Right  Result Date: 03/24/2021 CLINICAL DATA:  Right shoulder pain after fall. EXAM: RIGHT SHOULDER - 2+ VIEW COMPARISON:  Radiograph 04/05/2020 FINDINGS: There is no evidence of fracture or dislocation. Moderate acromioclavicular degenerative change. Small subacromial spur. There is soft tissue calcifications adjacent to the lateral humeral head, not  seen on prior exam. No erosion or bone destruction. IMPRESSION: 1. No fracture or dislocation of the right shoulder. 2. Soft tissue calcifications adjacent to the lateral humeral head, new from prior exam, may represent calcific tendinopathy or bursitis. Electronically Signed   By: Keith Rake M.D.   On: 03/24/2021 21:50   CT HEAD WO CONTRAST (5MM)  Result Date: 03/24/2021 CLINICAL DATA:  Generalized weakness that started 1 week ago. Multiple falls. EXAM: CT HEAD WITHOUT CONTRAST CT CERVICAL SPINE WITHOUT CONTRAST TECHNIQUE: Multidetector CT imaging of the head and cervical spine was performed following the standard protocol without intravenous contrast. Multiplanar CT image reconstructions of the cervical spine were also generated. COMPARISON:  None. FINDINGS: CT HEAD FINDINGS Brain: There is no evidence of acute intracranial hemorrhage, mass lesion, brain edema or extra-axial fluid collection. Mild atrophy with prominence of the ventricles and subarachnoid spaces. The dilatation of the lateral and 3rd ventricles is in slight excess of the sulcal prominence, but there is no definite hydrocephalus. Mild patchy low-density in the periventricular white matter likely reflects chronic small vessel ischemic changes. There is no CT evidence of acute cortical infarction. Vascular: Intracranial vascular calcifications. No hyperdense vessel identified. Skull: Negative for fracture or focal lesion. Sinuses/Orbits: The visualized paranasal sinuses and mastoid air cells  are clear. No orbital abnormalities are seen. Other: None. CT CERVICAL SPINE FINDINGS Alignment: Normal. Skull base and vertebrae: No evidence of acute cervical spine fracture or traumatic subluxation. Soft tissues and spinal canal: No prevertebral fluid or swelling. No visible canal hematoma. There is synovial calcifications surrounding the odontoid process without osseous erosion or mass effect on the craniocervical junction. Disc levels: Multilevel  cervical spondylosis with disc bulging, uncinate spurring and facet hypertrophy. There is mild to moderate resulting osseous foraminal narrowing which appears worst at the C4-5 and C6-7 levels. Upper chest: Emphysematous changes and mild scarring at both lung apices. Severe bilateral carotid atherosclerosis. Other: None. IMPRESSION: 1. No acute intracranial or calvarial findings. 2. Atrophy and mild chronic small vessel ischemic changes. 3. No evidence of acute cervical spine fracture, traumatic subluxation or static signs of instability. 4. Multilevel cervical spondylosis. Electronically Signed   By: Richardean Sale M.D.   On: 03/24/2021 15:59   CT Cervical Spine Wo Contrast  Result Date: 03/24/2021 CLINICAL DATA:  Generalized weakness that started 1 week ago. Multiple falls. EXAM: CT HEAD WITHOUT CONTRAST CT CERVICAL SPINE WITHOUT CONTRAST TECHNIQUE: Multidetector CT imaging of the head and cervical spine was performed following the standard protocol without intravenous contrast. Multiplanar CT image reconstructions of the cervical spine were also generated. COMPARISON:  None. FINDINGS: CT HEAD FINDINGS Brain: There is no evidence of acute intracranial hemorrhage, mass lesion, brain edema or extra-axial fluid collection. Mild atrophy with prominence of the ventricles and subarachnoid spaces. The dilatation of the lateral and 3rd ventricles is in slight excess of the sulcal prominence, but there is no definite hydrocephalus. Mild patchy low-density in the periventricular white matter likely reflects chronic small vessel ischemic changes. There is no CT evidence of acute cortical infarction. Vascular: Intracranial vascular calcifications. No hyperdense vessel identified. Skull: Negative for fracture or focal lesion. Sinuses/Orbits: The visualized paranasal sinuses and mastoid air cells are clear. No orbital abnormalities are seen. Other: None. CT CERVICAL SPINE FINDINGS Alignment: Normal. Skull base and  vertebrae: No evidence of acute cervical spine fracture or traumatic subluxation. Soft tissues and spinal canal: No prevertebral fluid or swelling. No visible canal hematoma. There is synovial calcifications surrounding the odontoid process without osseous erosion or mass effect on the craniocervical junction. Disc levels: Multilevel cervical spondylosis with disc bulging, uncinate spurring and facet hypertrophy. There is mild to moderate resulting osseous foraminal narrowing which appears worst at the C4-5 and C6-7 levels. Upper chest: Emphysematous changes and mild scarring at both lung apices. Severe bilateral carotid atherosclerosis. Other: None. IMPRESSION: 1. No acute intracranial or calvarial findings. 2. Atrophy and mild chronic small vessel ischemic changes. 3. No evidence of acute cervical spine fracture, traumatic subluxation or static signs of instability. 4. Multilevel cervical spondylosis. Electronically Signed   By: Richardean Sale M.D.   On: 03/24/2021 15:59   MR SHOULDER RIGHT WO CONTRAST  Result Date: 03/29/2021 CLINICAL DATA:  Right shoulder pain and limited range of motion. EXAM: MRI OF THE RIGHT SHOULDER WITHOUT CONTRAST TECHNIQUE: Multiplanar, multisequence MR imaging of the shoulder was performed. No intravenous contrast was administered. COMPARISON:  Right shoulder x-ray dated March 24, 2021. FINDINGS: Limited study due to required protocol modifications related to patient's EVAR stent graft. Fluid sensitive sequences could not be obtained. Rotator cuff: Severe supraspinatus tendinosis with fraying at the insertion but no discrete tear. Intact infraspinatus, subscapularis, and teres minor tendons. Muscles: No atrophy or abnormal signal of the muscles of the rotator cuff. Biceps long head:  Intact and normally positioned. Acromioclavicular Joint: Moderate arthropathy of the acromioclavicular joint. Type I acromion. No significant subacromial/subdeltoid bursal fluid. Glenohumeral Joint:  No joint effusion. No chondral defect. Labrum: Grossly intact, but evaluation is limited by lack of intraarticular fluid. Bones:  No marrow abnormality, fracture or dislocation. Other: None. IMPRESSION: 1. Limited study due to required protocol modifications related to patient's EVAR stent graft. 2. Severe supraspinatus tendinosis with fraying at the insertion but no discrete tear. 3. Moderate acromioclavicular osteoarthritis. Electronically Signed   By: Obie Dredge M.D.   On: 03/29/2021 10:16   DG Shoulder Left Port  Result Date: 03/30/2021 CLINICAL DATA:  Left shoulder pain EXAM: LEFT SHOULDER COMPARISON:  None. FINDINGS: There is no evidence of fracture or dislocation. There is no evidence of arthropathy or other focal bone abnormality. Soft tissues are unremarkable. Likely vascular calcifications. IMPRESSION: No acute displaced fracture or dislocation. Electronically Signed   By: Tish Frederickson M.D.   On: 03/30/2021 15:22   DG Hip Unilat W or Wo Pelvis 2-3 Views Left  Result Date: 03/24/2021 CLINICAL DATA:  Weakness. EXAM: DG HIP (WITH OR WITHOUT PELVIS) 2-3V LEFT COMPARISON:  Abdominal radiographs 08/02/2016. Abdominopelvic CT 03/09/2013. FINDINGS: The mineralization and alignment are normal. There are advanced degenerative changes of both hips which have progressed compared with the prior studies. There are associated osteophytes, subchondral cysts and subchondral sclerosis. There is a nondisplaced fracture of the left greater trochanter. No definite extension of this fracture cross the femoral neck or into the intertrochanteric region is identified. There are diffuse vascular calcifications status post aorto bi-iliac stent grafting. Patient is status post ventral hernia repair. IMPRESSION: 1. Acute fracture of the left greater trochanter. If the patient is unable to bear weight or there is additional clinical concern for intertrochanteric extension of this fracture, consider further evaluation  with CT. 2. Advanced osteoarthritic changes of both hips. Electronically Signed   By: Carey Bullocks M.D.   On: 03/24/2021 15:37

## 2021-03-31 NOTE — Procedures (Signed)
Procedure: Right shoulder injection   Indication: Right shoulder supraspinatus tendinosis   Surgeon: Charma Igo, PA-C   Assist: None   Anesthesia: Topical refrigerant   EBL: None   Complications: None   Findings: After risks/benefits explained patient desires to undergo procedure. Consent obtained and time out performed. The right posterior shoulder was sterilely prepped. 63ml 0.5% Marcaine and 40mg  depomedrol injected into the subacromial space. Pt tolerated the procedure well.       , PA-C Orthopedic Surgery [phone]

## 2021-03-31 NOTE — Progress Notes (Signed)
Physical Therapy Treatment Patient Details Name: Eugene [name] MRN: [id number] DOB: 08-15-1935 Today's Date: 03/31/2021   History of Present Illness Pt is an 85 y.o. male admitted on 03/24/21 with c/o general weakness, multiple falls, right shoulder pain, and acute left hip pain. Chest x-ray and CT of head and neck negative. Lab showed hyponatermia. X-ray of left hip showed acute fx (non-operative management, WBAT). X-ray of right shoulder showed no fx. PMH includes AAA s/p stent graft, aortic stenosis, HDL, HTN, TIA, permanent a-fib.    PT Comments    Pt continues to be slightly lethargic during treatment sessions, requiring increased cuing for participation with mobility. Pt agreeable to getting up to chair. Pt overall movement is less stiff and antalgic, however is limited by increased fatigue and fear of falling, decreased AROM, in presence of generalized weakness, decreased balance and endurance. Pt requires mod-maxAx2 for bed mobility and mod-maxAx2 for transfer to chair. D/c plans remain appropriate. PT will continue to follow acutely.    Recommendations for follow up therapy are one component of a multi-disciplinary discharge planning process, led by the attending physician.  Recommendations may be updated based on patient status, additional functional criteria and insurance authorization.  Follow Up Recommendations  Skilled nursing-short term rehab (<3 hours/day)        Equipment Recommendations  Rolling walker (2 wheels);Wheelchair (measurements PT);Wheelchair cushion (measurements PT) (if unable to progress mobility pt may benefit from W/c initially)    Recommendations for Other Services       Precautions / Restrictions Precautions Precautions: Fall Restrictions Weight Bearing Restrictions: No LLE Weight Bearing: Weight bearing as tolerated     Mobility  Bed Mobility Overal bed mobility: Needs Assistance Bed Mobility: Supine to Sit;Sit to Supine     Supine to  sit: +2 for physical assistance;Max assist;Mod assist     General bed mobility comments: pt moving more easily, still requiring mod-maxAx2 to come to EoB. requires increased cuing for efficient hand placement to assist with scooting to EoB.    Transfers Overall transfer level: Needs assistance Equipment used: Rolling walker (2 wheels);2 person hand held assist Transfers: Sit to/from [facility] Sit to Stand: Mod assist;+2 physical assistance;From elevated surface           General transfer comment: modAx2 for powerup to standing, pt with very flexed posture and despite maximal multimodal cuing unable to achieve fully upright, had to sit back down and requires maxAx2 for stand step transfer to recliner on L, pt still not able to achieve upright, leaning face on therapist shoulder                       Balance Overall balance assessment: Needs assistance Sitting-balance support: Feet supported;Single extremity supported Sitting balance-Leahy Scale: Poor Sitting balance - Comments: requires increased effort and sometimes outside support to keep from leaning backwards. Postural control: Left lateral lean   Standing balance-Leahy Scale: Zero                              Cognition Arousal/Alertness: Lethargic Behavior During Therapy: WFL for tasks assessed/performed;Flat affect Overall Cognitive Status: Within Functional Limits for tasks assessed                                          Exercises General Exercises - Lower Extremity  Long Arc Quad: AROM;Both;5 reps;Seated Hip Flexion/Marching: AROM;Both;5 reps;Seated Toe Raises: AROM;Both;5 reps;Seated Heel Raises: AROM;Both;5 reps;Seated    General Comments General comments (skin integrity, edema, etc.): Pt continues to be lethargic with not enough energy to keep himself upright      Pertinent Vitals/Pain Pain Assessment: Faces Faces Pain Scale: Hurts whole lot Pain  Location: L hip, R shoulder, back Pain Descriptors / Indicators: Sore;Grimacing;Guarding;Moaning     PT Goals (current goals can now be found in the care plan section) Acute Rehab PT Goals Patient Stated Goal: Pt wants to return home PT Goal Formulation: With patient Time For Goal Achievement: 04/08/21 Potential to Achieve Goals: Fair Progress towards PT goals: Progressing toward goals    Frequency    Min 3X/week      PT Plan Discharge plan needs to be updated       AM-PAC PT "6 Clicks" Mobility   Outcome Measure  Help needed turning from your back to your side while in a flat bed without using bedrails?: A Lot Help needed moving from lying on your back to sitting on the side of a flat bed without using bedrails?: Total Help needed moving to and from a bed to a chair (including a wheelchair)?: Total Help needed standing up from a chair using your arms (e.g., wheelchair or bedside chair)?: Total Help needed to walk in hospital room?: Total Help needed climbing 3-5 steps with a railing? : Total 6 Click Score: 7    End of Session Equipment Utilized During Treatment: Gait belt Activity Tolerance: Patient limited by pain;Patient limited by fatigue Patient left: with call bell/phone within reach;in bed;with bed alarm set;with family/visitor present Nurse Communication: Mobility status PT Visit Diagnosis: Unsteadiness on feet (R26.81);Other abnormalities of gait and mobility (R26.89);Repeated falls (R29.6);Muscle weakness (generalized) (M62.81);History of falling (Z91.81);Pain     Time: 1212-1239 PT Time Calculation (min) (ACUTE ONLY): 27 min  Charges:  $Therapeutic Exercise: 8-22 mins $Therapeutic Activity: 8-22 mins                     Annajulia Lewing B. Beverely Risen PT, DPT Acute Rehabilitation Services Pager [phone] Office [phone]    Eugene [name] 03/31/2021, 1:37 PM

## 2021-04-01 ENCOUNTER — Inpatient Hospital Stay (HOSPITAL_COMMUNITY): Payer: Medicare Other

## 2021-04-01 DIAGNOSIS — S72115S Nondisplaced fracture of greater trochanter of left femur, sequela: Secondary | ICD-10-CM

## 2021-04-01 LAB — COMPREHENSIVE METABOLIC PANEL
ALT: 250 U/L — ABNORMAL HIGH (ref 0–44)
AST: 180 U/L — ABNORMAL HIGH (ref 15–41)
Albumin: 2.6 g/dL — ABNORMAL LOW (ref 3.5–5.0)
Alkaline Phosphatase: 72 U/L (ref 38–126)
Anion gap: 7 (ref 5–15)
BUN: 51 mg/dL — ABNORMAL HIGH (ref 8–23)
CO2: 27 mmol/L (ref 22–32)
Calcium: 8.8 mg/dL — ABNORMAL LOW (ref 8.9–10.3)
Chloride: 95 mmol/L — ABNORMAL LOW (ref 98–111)
Creatinine, Ser: 1.04 mg/dL (ref 0.61–1.24)
GFR, Estimated: >60 mL/min (ref >60)
Glucose, Bld: 104 mg/dL — ABNORMAL HIGH (ref 70–99)
Potassium: 4.3 mmol/L (ref 3.5–5.1)
Sodium: 129 mmol/L — ABNORMAL LOW (ref 135–145)
Total Bilirubin: 1.2 mg/dL (ref 0.3–1.2)
Total Protein: 5.9 g/dL — ABNORMAL LOW (ref 6.5–8.1)

## 2021-04-01 LAB — CBC WITH DIFFERENTIAL/PLATELET
Abs Immature Granulocytes: 0.1 10*3/uL — ABNORMAL HIGH (ref 0.00–0.07)
Basophils Absolute: 0.1 10*3/uL (ref 0.0–0.1)
Basophils Relative: 1 %
Eosinophils Absolute: 0.2 10*3/uL (ref 0.0–0.5)
Eosinophils Relative: 4 %
HCT: 31.2 % — ABNORMAL LOW (ref 39.0–52.0)
Hemoglobin: 10.6 g/dL — ABNORMAL LOW (ref 13.0–17.0)
Immature Granulocytes: 2 %
Lymphocytes Relative: 18 %
Lymphs Abs: 1 10*3/uL (ref 0.7–4.0)
MCH: 32.9 pg (ref 26.0–34.0)
MCHC: 34 g/dL (ref 30.0–36.0)
MCV: 96.9 fL (ref 80.0–100.0)
Monocytes Absolute: 0.6 10*3/uL (ref 0.1–1.0)
Monocytes Relative: 11 %
Neutro Abs: 3.6 10*3/uL (ref 1.7–7.7)
Neutrophils Relative %: 64 %
Platelets: 333 10*3/uL (ref 150–400)
RBC: 3.22 MIL/uL — ABNORMAL LOW (ref 4.22–5.81)
RDW: 13.5 % (ref 11.5–15.5)
WBC: 5.5 10*3/uL (ref 4.0–10.5)
nRBC: 0 % (ref 0.0–0.2)

## 2021-04-01 LAB — BRAIN NATRIURETIC PEPTIDE: B Natriuretic Peptide: 334.2 pg/mL — ABNORMAL HIGH (ref 0.0–100.0)

## 2021-04-01 LAB — MAGNESIUM: Magnesium: 2.1 mg/dL (ref 1.7–2.4)

## 2021-04-01 MED ORDER — DIPHENHYDRAMINE HCL 25 MG PO CAPS
25.0000 mg | ORAL_CAPSULE | Freq: Once | ORAL | Status: AC
  Administered 2021-04-01: 25 mg via ORAL
  Filled 2021-04-01: qty 1

## 2021-04-01 NOTE — Progress Notes (Signed)
PROGRESS NOTE                                                                                                                                                                                                             Patient Demographics:    Eugene [name], is a 85 y.o. male, DOB - 10-Dec-1935, [id number]  Outpatient Primary MD for the patient is [name], MD    LOS - 8  Admit date - 03/24/2021    Chief Complaint  Patient presents with   Weakness       Brief Narrative (HPI from H&P)      Eugene [name] is a 85 y.o. male with medical history significant of permanent A. fib on Eliquis, mild aortic stenosis/regurgitation, hypertension, hyperlipidemia, CVA/TIA, IBS, hemochromatosis followed by Dr. Darnelle Catalan, GERD, AAA status post repair presented to the ED complaining of generalized weakness, multiple falls, and acute onset left hip pain.  He presented to the ER with generalized weakness and ongoing left hip pain was diagnosed with severe dehydration, hypokalemia, hypomagnesemia, left greater trochanteric fracture, renal and orthopedics were consulted and he was admitted to the hospital.   Subjective:   Patient complains of pain at left hip, but overall controlled, reports he was able to on the chair for few hours yesterday, reports appetite is improving.    Assessment  & Plan :    Severe Hyponatremia, Hypotension, Dehydration  - multiple falls -thought initially to be secondary to volume depletion, from dehydration and thiazide use, but remains with hyponatremia with correction of volume status, at that plateau, with urine sodium of 47, some suggestion of SIADH, so improving on urea and tolvaptan . -Continue with low-dose urea, if sodium remains less than 130 with increase to 30 twice daily, would avoid tolvaptan in the future given elevated LFTs.  Multiple falls with left greater trochanteric fracture, hip  pain.   -Management per orthopedic, nonoperative management, discussed with Dr. Aundria Rud 11/3 given severe left hip pain, patient with severe underlying left hip osteoarthritis, so certainly some baseline pain in the setting of such severe osteoarthritis, this is complicated by greater trochanteric fracture, which prohibits up replacement at this point, and current recommendation is for PT/OT and pain control . -Patient with significant left hip area bruising, this seems to be stable at demarcated line, H&H is stable. -Patient  with right  shoulder Severe supraspinatus tendinosis , status post right shoulder injection by orthopedic.  Report pain and motion has significantly improved.  HTN.  Blood pressure is low, blood pressure medications held.  Hypokalemia and hypomagnesemia.  Replace and monitor.  Permanent A. fib with [place] vas 2 score of 3.  Continue Eliquis with caution, beta-blocker if blood pressure tolerates, post 2 doses of digoxin on 03/25/21 and repeat 1 dose on 03/28/21 for short-term for rate control, amiodarone if all fails.  Dyslipidemia.  On statin.  History of hemochromatosis.  Outpatient follow-up.  Anxiety and depression.  Trazodone.  Elevated BUN: -Continue to monitor on lower dose urea  Elevated LFTs -LFTs trending up, most likely in setting of tolvaptan, negative hepatitis panel, ultrasound with no acute findings . -Now is trending down, continue to monitor closely, continue to hold hepatotoxic medications, statin has been discontinued . -Avoid tolvaptan in the future         Condition - Fair  Family Communication  :   D/W son by phone 11/4.  11/5.  Code Status :  Full  Consults  :  Renal, Ortho  PUD Prophylaxis :    Procedures  :     MRI R Shoulder - 1. Limited study due to required protocol modifications related to patient's EVAR stent graft. 2. Severe supraspinatus tendinosis with fraying at the insertion but no discrete tear. 3. Moderate  acromioclavicular osteoarthritis  CT - Head and C Spine - 1. No acute intracranial or calvarial findings. 2. Atrophy and mild chronic small vessel ischemic changes. 3. No evidence of acute cervical spine fracture, traumatic subluxation or static signs of instability. 4. Multilevel cervical spondylosis.      Disposition Plan  :    Status is: Inpatient  Remains inpatient appropriate because: Severe Hyponatremia, left hip fracture, right shoulder injury >> SNF.  DVT Prophylaxis  :    apixaban (ELIQUIS) tablet 5 mg    Lab Results  Component Value Date   PLT 333 04/01/2021    Diet :  Diet Order             Diet regular Room service appropriate? Yes; Fluid consistency: Thin; Fluid restriction: 1800 mL Fluid  Diet effective now                    Inpatient Medications  Scheduled Meds:  ALPRAZolam  0.5 mg Oral BID   apixaban  5 mg Oral BID   diclofenac Sodium  2 g Topical TID   metoprolol tartrate  25 mg Oral BID   urea  15 g Oral BID   Continuous Infusions:   PRN Meds:.HYDROcodone-acetaminophen, metoprolol tartrate, traMADol, traZODone  Antibiotics  :    Anti-infectives (From admission, onward)    None         Huey Bienenstock M.D on 04/01/2021 at 1:16 PM  To page go to [email or web address]   [facility] -  Office  [phone]  See all Orders from today for further details    Objective:   Vitals:   03/31/21 1119 03/31/21 2020 04/01/21 0347 04/01/21 1137  BP: 110/64 135/80 113/69 105/74  Pulse: 82 96 79 90  Resp: Temp: 98.5 F (36.9 C) 98.2 F (36.8 C) 98 F (36.7 C) 97.7 F (36.5 C)  TempSrc: Oral Oral Oral Oral  SpO2: 100% 98% 100% 99%  Weight:   83.6 kg   Height:        Wt Readings from Last  3 Encounters:  04/01/21 83.6 kg  02/15/21 88.2 kg  05/16/20 87.9 kg     Intake/Output Summary (Last 24 hours) at 04/01/2021 1316 Last data filed at 04/01/2021 1142 Gross per 24 hour  Intake 1015 ml  Output 3200 ml  Net -2185  ml     Physical Exam  Awake Alert, Oriented X 3, Trembly frail, deconditioned, chronically ill-appearing Symmetrical Chest wall movement, Good air movement bilaterally, CTAB RRR,No Gallops,Rubs or new Murmurs, No Parasternal Heave +ve B.Sounds, Abd Soft, No tenderness, No rebound - guarding or rigidity. No Cyanosis, Clubbing or edema,  significant bruising at left hip area.  But remained stable within demarcated lines     Data Review:    CBC Recent Labs  Lab 03/28/21 0350 03/29/21 0333 03/30/21 0254 03/31/21 0406 04/01/21 0522  WBC 9.1 8.2 7.5 6.7 5.5  HGB 10.6* 10.6* 10.5* 10.4* 10.6*  HCT 29.2* 30.1* 29.7* 29.7* 31.2*  PLT 253 258 294 329 333  MCV 94.2 95.9 95.2 97.4 96.9  MCH 34.2* 33.8 33.7 34.1* 32.9  MCHC 36.3* 35.2 35.4 35.0 34.0  RDW 13.7 13.9 13.7 13.6 13.5  LYMPHSABS 0.9 1.0 1.0 1.0 1.0  MONOABS 1.1* 1.2* 1.0 0.8 0.6  EOSABS 0.1 0.1 0.1 0.2 0.2  BASOSABS 0.0 0.0 0.0 0.0 0.1    Recent Labs  Lab 03/28/21 0350 03/28/21 0831 03/29/21 0333 03/29/21 1254 03/30/21 0254 03/31/21 0406 04/01/21 0522  NA 125*   < > 128* 129* 130* 131* 129*  K 3.7  --  3.9  --  4.0 4.3 4.3  CL 93*  --  96*  --  98 96* 95*  CO2 23  --  24  --  26 26 27   GLUCOSE 109*  --  112*  --  116* 103* 104*  BUN 49*  --  44*  --  83* 72* 51*  CREATININE 1.02  --  1.23  --  1.19 1.18 1.04  CALCIUM 9.0  --  8.9  --  9.1 9.1 8.8*  AST 32  --  53*  --  144* 249* 180*  ALT 36  --  60*  --  143* 250* 250*  ALKPHOS 47  --  50  --  57 70 72  BILITOT 1.7*  --  1.2  --  1.1 1.2 1.2  ALBUMIN 2.9*  --  2.7*  --  2.6* 2.6* 2.6*  MG 1.9  --  2.0  --  2.1 2.0 2.1  BNP 621.6*  --  380.7*  --  485.6* 240.7* 334.2*   < > = values in this interval not displayed.    ------------------------------------------------------------------------------------------------------------------ No results for input(s): CHOL, HDL, LDLCALC, TRIG, CHOLHDL, LDLDIRECT in the last 72 hours.  No results found for:  HGBA1C ------------------------------------------------------------------------------------------------------------------ No results for input(s): TSH, T4TOTAL, T3FREE, THYROIDAB in the last 72 hours.  Invalid input(s): FREET3   Cardiac Enzymes No results for input(s): CKMB, TROPONINI, MYOGLOBIN in the last 168 hours.  Invalid input(s): CK ------------------------------------------------------------------------------------------------------------------    Component Value Date/Time   BNP 334.2 (H) 04/01/2021 0522     Radiology Reports DG Chest 2 View  Result Date: 03/24/2021 CLINICAL DATA:  Weakness. EXAM: CHEST - 2 VIEW COMPARISON:  Radiographs 05/26/2019.  CT 11/06/2005. FINDINGS: The heart size and mediastinal contours are stable with aortic atherosclerosis. There is mild left basilar atelectasis. The lungs otherwise appear clear, without confluent airspace opacity, edema or pleural effusion. No acute fractures are seen. There are mild degenerative changes in the spine.  IMPRESSION: No evidence of active cardiopulmonary process. Electronically Signed   By: Carey Bullocks M.D.   On: 03/24/2021 15:33   DG Shoulder Right  Result Date: 03/24/2021 CLINICAL DATA:  Right shoulder pain after fall. EXAM: RIGHT SHOULDER - 2+ VIEW COMPARISON:  Radiograph 04/05/2020 FINDINGS: There is no evidence of fracture or dislocation. Moderate acromioclavicular degenerative change. Small subacromial spur. There is soft tissue calcifications adjacent to the lateral humeral head, not seen on prior exam. No erosion or bone destruction. IMPRESSION: 1. No fracture or dislocation of the right shoulder. 2. Soft tissue calcifications adjacent to the lateral humeral head, new from prior exam, may represent calcific tendinopathy or bursitis. Electronically Signed   By: Narda Rutherford M.D.   On: 03/24/2021 21:50   CT HEAD WO CONTRAST ( )  Result Date: 03/24/2021 CLINICAL DATA:  Generalized weakness that started  1 week ago. Multiple falls. EXAM: CT HEAD WITHOUT CONTRAST CT CERVICAL SPINE WITHOUT CONTRAST TECHNIQUE: Multidetector CT imaging of the head and cervical spine was performed following the standard protocol without intravenous contrast. Multiplanar CT image reconstructions of the cervical spine were also generated. COMPARISON:  None. FINDINGS: CT HEAD FINDINGS Brain: There is no evidence of acute intracranial hemorrhage, mass lesion, brain edema or extra-axial fluid collection. Mild atrophy with prominence of the ventricles and subarachnoid spaces. The dilatation of the lateral and 3rd ventricles is in slight excess of the sulcal prominence, but there is no definite hydrocephalus. Mild patchy low-density in the periventricular white matter likely reflects chronic small vessel ischemic changes. There is no CT evidence of acute cortical infarction. Vascular: Intracranial vascular calcifications. No hyperdense vessel identified. Skull: Negative for fracture or focal lesion. Sinuses/Orbits: The visualized paranasal sinuses and mastoid air cells are clear. No orbital abnormalities are seen. Other: None. CT CERVICAL SPINE FINDINGS Alignment: Normal. Skull base and vertebrae: No evidence of acute cervical spine fracture or traumatic subluxation. Soft tissues and spinal canal: No prevertebral fluid or swelling. No visible canal hematoma. There is synovial calcifications surrounding the odontoid process without osseous erosion or mass effect on the craniocervical junction. Disc levels: Multilevel cervical spondylosis with disc bulging, uncinate spurring and facet hypertrophy. There is mild to moderate resulting osseous foraminal narrowing which appears worst at the C4-5 and C6-7 levels. Upper chest: Emphysematous changes and mild scarring at both lung apices. Severe bilateral carotid atherosclerosis. Other: None. IMPRESSION: 1. No acute intracranial or calvarial findings. 2. Atrophy and mild chronic small vessel ischemic  changes. 3. No evidence of acute cervical spine fracture, traumatic subluxation or static signs of instability. 4. Multilevel cervical spondylosis. Electronically Signed   By: Carey Bullocks M.D.   On: 03/24/2021 15:59   CT Cervical Spine Wo Contrast  Result Date: 03/24/2021 CLINICAL DATA:  Generalized weakness that started 1 week ago. Multiple falls. EXAM: CT HEAD WITHOUT CONTRAST CT CERVICAL SPINE WITHOUT CONTRAST TECHNIQUE: Multidetector CT imaging of the head and cervical spine was performed following the standard protocol without intravenous contrast. Multiplanar CT image reconstructions of the cervical spine were also generated. COMPARISON:  None. FINDINGS: CT HEAD FINDINGS Brain: There is no evidence of acute intracranial hemorrhage, mass lesion, brain edema or extra-axial fluid collection. Mild atrophy with prominence of the ventricles and subarachnoid spaces. The dilatation of the lateral and 3rd ventricles is in slight excess of the sulcal prominence, but there is no definite hydrocephalus. Mild patchy low-density in the periventricular white matter likely reflects chronic small vessel ischemic changes. There is no CT evidence of acute cortical infarction.  Vascular: Intracranial vascular calcifications. No hyperdense vessel identified. Skull: Negative for fracture or focal lesion. Sinuses/Orbits: The visualized paranasal sinuses and mastoid air cells are clear. No orbital abnormalities are seen. Other: None. CT CERVICAL SPINE FINDINGS Alignment: Normal. Skull base and vertebrae: No evidence of acute cervical spine fracture or traumatic subluxation. Soft tissues and spinal canal: No prevertebral fluid or swelling. No visible canal hematoma. There is synovial calcifications surrounding the odontoid process without osseous erosion or mass effect on the craniocervical junction. Disc levels: Multilevel cervical spondylosis with disc bulging, uncinate spurring and facet hypertrophy. There is mild to  moderate resulting osseous foraminal narrowing which appears worst at the C4-5 and C6-7 levels. Upper chest: Emphysematous changes and mild scarring at both lung apices. Severe bilateral carotid atherosclerosis. Other: None. IMPRESSION: 1. No acute intracranial or calvarial findings. 2. Atrophy and mild chronic small vessel ischemic changes. 3. No evidence of acute cervical spine fracture, traumatic subluxation or static signs of instability. 4. Multilevel cervical spondylosis. Electronically Signed   By: Carey Bullocks M.D.   On: 03/24/2021 15:59   MR SHOULDER RIGHT WO CONTRAST  Result Date: 03/29/2021 CLINICAL DATA:  Right shoulder pain and limited range of motion. EXAM: MRI OF THE RIGHT SHOULDER WITHOUT CONTRAST TECHNIQUE: Multiplanar, multisequence MR imaging of the shoulder was performed. No intravenous contrast was administered. COMPARISON:  Right shoulder x-ray dated March 24, 2021. FINDINGS: Limited study due to required protocol modifications related to patient's EVAR stent graft. Fluid sensitive sequences could not be obtained. Rotator cuff: Severe supraspinatus tendinosis with fraying at the insertion but no discrete tear. Intact infraspinatus, subscapularis, and teres minor tendons. Muscles: No atrophy or abnormal signal of the muscles of the rotator cuff. Biceps long head:  Intact and normally positioned. Acromioclavicular Joint: Moderate arthropathy of the acromioclavicular joint. Type I acromion. No significant subacromial/subdeltoid bursal fluid. Glenohumeral Joint: No joint effusion. No chondral defect. Labrum: Grossly intact, but evaluation is limited by lack of intraarticular fluid. Bones:  No marrow abnormality, fracture or dislocation. Other: None. IMPRESSION: 1. Limited study due to required protocol modifications related to patient's EVAR stent graft. 2. Severe supraspinatus tendinosis with fraying at the insertion but no discrete tear. 3. Moderate acromioclavicular osteoarthritis.  Electronically Signed   By: Obie Dredge M.D.   On: 03/29/2021 10:16   DG Shoulder Left Port  Result Date: 03/30/2021 CLINICAL DATA:  Left shoulder pain EXAM: LEFT SHOULDER COMPARISON:  None. FINDINGS: There is no evidence of fracture or dislocation. There is no evidence of arthropathy or other focal bone abnormality. Soft tissues are unremarkable. Likely vascular calcifications. IMPRESSION: No acute displaced fracture or dislocation. Electronically Signed   By: Tish Frederickson M.D.   On: 03/30/2021 15:22   DG Hip Unilat W or Wo Pelvis 2-3 Views Left  Result Date: 03/24/2021 CLINICAL DATA:  Weakness. EXAM: DG HIP (WITH OR WITHOUT PELVIS) 2-3V LEFT COMPARISON:  Abdominal radiographs 08/02/2016. Abdominopelvic CT 03/09/2013. FINDINGS: The mineralization and alignment are normal. There are advanced degenerative changes of both hips which have progressed compared with the prior studies. There are associated osteophytes, subchondral cysts and subchondral sclerosis. There is a nondisplaced fracture of the left greater trochanter. No definite extension of this fracture cross the femoral neck or into the intertrochanteric region is identified. There are diffuse vascular calcifications status post aorto bi-iliac stent grafting. Patient is status post ventral hernia repair. IMPRESSION: 1. Acute fracture of the left greater trochanter. If the patient is unable to bear weight or there is additional clinical  concern for intertrochanteric extension of this fracture, consider further evaluation with CT. 2. Advanced osteoarthritic changes of both hips. Electronically Signed   By: Carey Bullocks M.D.   On: 03/24/2021 15:37   US Abdomen Limited RUQ (LIVER/GB)  Result Date: 04/01/2021 CLINICAL DATA:  Elevated liver function tests. Remote cholecystectomy. EXAM: ULTRASOUND ABDOMEN LIMITED RIGHT UPPER QUADRANT COMPARISON:  03/26/2013 CT abdomen/pelvis FINDINGS: Gallbladder: Surgically absent. Common bile duct: Diameter:  5 mm Liver: No focal lesion identified. Within normal limits in parenchymal echogenicity. Portal vein is patent on color Doppler imaging with normal direction of blood flow towards the liver. Other: None. IMPRESSION: 1. Normal liver.  No liver masses. 2. No biliary ductal dilatation status post cholecystectomy. Electronically Signed   By: Delbert Phenix M.D.   On: 04/01/2021 09:39

## 2021-04-01 NOTE — Progress Notes (Signed)
[facility] Progress Note   Subjective:   Patient is resting today with no complaints. Objective Vitals:   03/31/21 0421 03/31/21 1119 03/31/21 2020 04/01/21 0347  BP: (!) 107/55 110/64 135/80 113/69  Pulse: 83 82 96 79  Resp: 18 18 16 14   Temp: 97.8 F (36.6 C) 98.5 F (36.9 C) 98.2 F (36.8 C) 98 F (36.7 C)  TempSrc: Oral Oral Oral Oral  SpO2: 100% 100% 98% 100%  Weight: 82.5 kg   83.6 kg  Height:       Physical Exam Gen: Lying in bed in no distress Eyes:  anicteric, EOMI, glasses ENT: MMM Neck: supple, no JVD CV: Normal rate Lungs: Bilateral chest rise with no increased work of breathing Extr:  no edema, warm and well perfused  Additional Objective Labs: Basic Metabolic Panel: Recent Labs  Lab 03/26/21 1631 03/27/21 0337 03/30/21 0254 03/31/21 0406 04/01/21 0522  NA 124*   < > 130* 131* 129*  K 4.0   < > 4.0 4.3 4.3  CL 91*   < > 98 96* 95*  CO2 25   < > 26 26 27   GLUCOSE 112*   < > 116* 103* 104*  BUN 12   < > 83* 72* 51*  CREATININE 1.22   < > 1.19 1.18 1.04  CALCIUM 8.6*   < > 9.1 9.1 8.8*  PHOS 2.5  --   --   --   --    < > = values in this interval not displayed.   Liver Function Tests: Recent Labs  Lab 03/30/21 0254 03/31/21 0406 04/01/21 0522  AST 144* 249* 180*  ALT 143* 250* 250*  ALKPHOS 57 70 72  BILITOT 1.1 1.2 1.2  PROT 5.6* 5.8* 5.9*  ALBUMIN 2.6* 2.6* 2.6*   No results for input(s): LIPASE, AMYLASE in the last 168 hours. CBC: Recent Labs  Lab 03/28/21 0350 03/29/21 0333 03/30/21 0254 03/31/21 0406 04/01/21 0522  WBC 9.1 8.2 7.5 6.7 5.5  NEUTROABS 7.0 5.8 5.2 4.5 3.6  HGB 10.6* 10.6* 10.5* 10.4* 10.6*  HCT 29.2* 30.1* 29.7* 29.7* 31.2*  MCV 94.2 95.9 95.2 97.4 96.9  PLT 253 258 294 329 333   Blood Culture No results found for: SDES, SPECREQUEST, CULT, REPTSTATUS  Cardiac Enzymes: No results for input(s): CKTOTAL, CKMB, CKMBINDEX, TROPONINI in the last 168 hours. CBG: No results for input(s): GLUCAP  in the last 168 hours.  Iron Studies: No results for input(s): IRON, TIBC, TRANSFERRIN, FERRITIN in the last 72 hours. @lablastinr3 @ Studies/Results: DG Shoulder Left Port  Result Date: 03/30/2021 CLINICAL DATA:  Left shoulder pain EXAM: LEFT SHOULDER COMPARISON:  None. FINDINGS: There is no evidence of fracture or dislocation. There is no evidence of arthropathy or other focal bone abnormality. Soft tissues are unremarkable. Likely vascular calcifications. IMPRESSION: No acute displaced fracture or dislocation. Electronically Signed   By: 13/05/22 M.D.   On: 03/30/2021 15:22   Medications:   ALPRAZolam  0.5 mg Oral BID   apixaban  5 mg Oral BID   diclofenac Sodium  2 g Topical TID   metoprolol tartrate  25 mg Oral BID   polyethylene glycol  17 g Oral BID   urea  15 g Oral BID    Assessment/Plan **Hyponatremia: Thought to initially be hypovolemic hyponatremia given dehydration and thiazide use but with correction of volume status sodium improved but now plateaued.  Urine sodium 47 and urine osmolality 358 off of fluids and diuretics suggesting that ADH  is overactive.  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  Received 15mg  tolvaptan on 11/1 with some improvement; he did have a transient LFT rise associated with this medication so would avoid it again in the future.  Sodium now significantly improved around 130.  Continue urea 15 g daily.  Given his improvement we will likely sign off at this time.  Would continue urea until sodium has returned to normal at 135.  Could stop it then and continue to monitor.  Can consider increasing to 30 g twice daily if the patient's sodium is consistently less than 130.  Do not hesitate to call 13/1 for further help if needed.  **HTN: on HCTZ, ramipril and metoprolol at home.  BB on here for A fib but others held.  BP on low side.    **Hip fracture:  nonoperative management.  Pain management per primary team  **Abnormal  LFTs: AST/ALT rose steadily for several days possibly related to tolvaptan.  Now improving.  Would avoid tolvaptan in the future  **Lethargy: Likely multifactorial.  BUN was elevated with urea therapy.  Now improved after decreasing dose.  No concerns   Will sign off at this time as above.

## 2021-04-02 LAB — SARS CORONAVIRUS 2 (TAT 6-24 HRS): SARS Coronavirus 2: NEGATIVE

## 2021-04-02 LAB — COMPREHENSIVE METABOLIC PANEL
ALT: 170 U/L — ABNORMAL HIGH (ref 0–44)
AST: 75 U/L — ABNORMAL HIGH (ref 15–41)
Albumin: 2.6 g/dL — ABNORMAL LOW (ref 3.5–5.0)
Alkaline Phosphatase: 83 U/L (ref 38–126)
Anion gap: 9 (ref 5–15)
BUN: 43 mg/dL — ABNORMAL HIGH (ref 8–23)
CO2: 27 mmol/L (ref 22–32)
Calcium: 8.9 mg/dL (ref 8.9–10.3)
Chloride: 92 mmol/L — ABNORMAL LOW (ref 98–111)
Creatinine, Ser: 1.06 mg/dL (ref 0.61–1.24)
GFR, Estimated: >60 mL/min (ref >60)
Glucose, Bld: 98 mg/dL (ref 70–99)
Potassium: 4.3 mmol/L (ref 3.5–5.1)
Sodium: 128 mmol/L — ABNORMAL LOW (ref 135–145)
Total Bilirubin: 1.5 mg/dL — ABNORMAL HIGH (ref 0.3–1.2)
Total Protein: 5.7 g/dL — ABNORMAL LOW (ref 6.5–8.1)

## 2021-04-02 MED ORDER — ALPRAZOLAM 1 MG PO TABS: 0.5000 mg | ORAL_TABLET | Freq: Two times a day (BID) | ORAL | 0 refills | Status: DC

## 2021-04-02 MED ORDER — NALOXONE HCL 0.4 MG/ML IJ SOLN: 0.4000 mg | INTRAMUSCULAR | Status: DC | PRN

## 2021-04-02 MED ORDER — HYDROCODONE-ACETAMINOPHEN 5-325 MG PO TABS
1.0000 | ORAL_TABLET | Freq: Once | ORAL | Status: AC
  Administered 2021-04-02: 1 via ORAL
  Filled 2021-04-02: qty 1

## 2021-04-02 MED ORDER — HYDROCODONE-ACETAMINOPHEN 5-325 MG PO TABS: 1.0000 | ORAL_TABLET | ORAL | 0 refills | Status: DC | PRN

## 2021-04-02 MED ORDER — UREA 15 G PO PACK: 30.0000 g | PACK | Freq: Two times a day (BID) | ORAL | Status: DC

## 2021-04-02 MED ORDER — UREA 15 G PO PACK
30.0000 g | PACK | Freq: Two times a day (BID) | ORAL | Status: DC
  Filled 2021-04-02: qty 2

## 2021-04-02 NOTE — Progress Notes (Signed)
Patient discharged: [facility] Left via: stretcher (P-cart) Discharge paperwork reviewed and given: facility nurse.  IV and telemetry disconnected. Belongings given to patient's son.

## 2021-04-02 NOTE — Progress Notes (Signed)
Patient complaining of severe left shoulder pain. Given Norco, heat pack, and repositioned patient. Patient requested RN to page MD for pain medicine. Dr. Arlean Hopping paged to make aware. Awaiting response.

## 2021-04-02 NOTE — Progress Notes (Signed)
[facility] night cross cover note:  I was contacted by her conveying the patient's request for additional pain medication as relates to his reported musculoskeletal left shoulder pain that he has been experiencing throughout his 8-day hospitalization following presenting fall.  Patient reportedly underwent prior plain films of the left shoulder demonstrating no evidence of acute process, while also previously undergoing chest x-ray which showed no evidence of acute cardiopulmonary process.  Per my chart review , current analgesic regimen appears to consist of Voltaren gel, as needed tramadol, as needed Norco.   The patient reports suboptimal pain control relating to his left shoulder following most recent dose of as needed Norco as well as prn dose of tramadol approximately 4 hours ago.  Per my chart review, he has a documented history of severe hyponatremia.  Consequently, will refrain from Toradol or additional NSAIDs at this time.  I subsequently ordered a one-time additional dose of Norco 5/325 mg and also placed order for prn Narcan and conveyed this plan to the patient's RN.    Newton Pigg, DO Hospitalist

## 2021-04-02 NOTE — TOC Transition Note (Signed)
Transition of Care [facility]) - CM/SW Discharge Note   Patient Details  Name: Eugene [name] MRN: [id number] Date of Birth: Oct 16, 1935  [facility] of Care [facility]) CM/SW Contact:  Patrice Paradise, LCSW Phone Number:[phone] 04/02/2021, 11:10 AM   Clinical Narrative:     Patient will DC to:?[facility] Anticipated DC date:?04/02/2021 Family notified:?Jamie Transport by: Sharin Mons   Per MD patient ready for DC to [facility]. RN, patient, patient's family, and facility notified of DC. Discharge Summary sent to facility. RN given number for report  [phone] [id number] room 209 DC packet on chart. Ambulance transport requested for patient.   CSW signing off.   Judd Lien, [place] [phone]   Final next level of care: Skilled Nursing Facility Barriers to Discharge: No Barriers Identified   Patient Goals and CMS Choice        Discharge Placement              Patient chooses bed at: [facility] Patient to be transferred to facility by: [facility] Name of family member notified: Asher Muir Patient and family notified of of transfer: 04/02/21  Discharge Plan and Services   Discharge Planning Services: CM Consult Post Acute Care Choice: Durable Medical Equipment                    HH Arranged: PT          Social Determinants of Health (SDOH) Interventions     Readmission Risk Interventions No flowsheet data found.

## 2021-04-02 NOTE — Discharge Summary (Signed)
Physician Discharge Summary  Eugene [name] [id number] DOB: 1935-10-04 DOA: 03/24/2021  PCP: Eugene Low, MD  Admit date: 03/24/2021 Discharge date: 04/02/2021  Admitted From: Home Disposition:  SNF at [facility]  Recommendations for Outpatient Follow-up:  Follow up with orthopedic Eugene [name] in 2 to 3 weeks Please check CBC/CMP.  Likely every 72 hours x 3 Please see recommendations  regarding urea dosing.  He will be discharged on urea 30 mg twice daily as potassium trending down, this can be decreased to 15 twice daily if sodium continues to trend up, and can be discontinued once sodium has normalized. recommendation for ambulation by orthopedic."He is appropriate for full weightbearing as tolerated but would recommend crutches or walker to help avoid abduction of the left hip"  Discharge Condition:Stable, CODE STATUS:FULL Diet recommendation: Regular   Brief/Interim Summary:  Eugene [name] is a 85 y.o. male with medical history significant of permanent A. fib on Eliquis, mild aortic stenosis/regurgitation, hypertension, hyperlipidemia, CVA/TIA, IBS, hemochromatosis followed by Dr. Jana [name], GERD, AAA status post repair presented to the ED complaining of generalized weakness, multiple falls, and acute onset left hip pain.  He presented to the ER with generalized weakness and ongoing left hip pain was diagnosed with severe dehydration, hypokalemia, hypomagnesemia, left greater trochanteric fracture, renal and orthopedics were consulted and he was admitted to the hospital.     Severe Hyponatremia, Hypotension, Dehydration  - multiple falls -thought initially to be secondary to volume depletion, from dehydration and thiazide use, but remains with hyponatremia with correction of volume status, at that plateaued, with urine sodium of 47, some suggestion of SIADH, so he was treated with tolvaptan, and urea, sodium level has improved, sodium level is down to 128 today, I have discussed  with renal, will need to increase his urea to 30 mg twice daily on discharge, and to monitor odium level 2-3 times weekly.  And decrease urea dose sodium level continues to improve.   -would avoid tolvaptan in the future given elevated LFTs.   Multiple falls with left greater trochanteric fracture, hip pain.   -Management per orthopedic, nonoperative management, discussed with Eugene [name] 11/3 given severe left hip pain, patient with severe underlying left hip osteoarthritis, so certainly some baseline pain in the setting of such severe osteoarthritis, this is complicated by greater trochanteric fracture, which prohibits up replacement at this point, and current recommendation is for PT/OT and pain control . -Patient with significant left hip area bruising, this seems to be stable at demarcated line, H&H is stable. -Patient with right  shoulder Severe supraspinatus tendinosis , status post right shoulder injection by orthopedic.  Report pain and motion has significantly improved. -patient will be discharged on Norco every 4 hours for pain control.   HTN.   -Patient on multiple medications at home, blood pressure has been on the lower side, so most medications has been stopped, he was kept on metoprolol mainly for heart rate control.    Elevated LFTs -These were normal on admission, they started to trend up, this is most likely in the setting of tolvaptan, work-up has been negative including negative hepatitis panel, and no acute finding in ultrasound . -LFTs trending down currently, AST 75 on discharge, ALT 170 on discharge.   -Avoid tolvaptan in the future    Hypokalemia and hypomagnesemia. Repleted   Permanent A. fib with Eugene [name] 2 score of 3.   - Continue Eliquis with caution, beta-blocker if blood pressure tolerates, did require some as needed digoxin initially . -  Heart rate is controlled on metoprolol, he is on Eliquis, H&H is stable, bruise and ecchymosis at left hip fracture site is     Dyslipidemia.  On statin.  Has been held on discharge given elevated LFTs, this can be discharged at a later time by PCP once renal function has returned to normal.   History of hemochromatosis.  Outpatient follow-up.   Anxiety and depression.  Trazodone.   Elevated BUN: -This was due to urea treatment, trending down.  Marland Kitchen       Discharge Diagnoses:  Principal Problem:   Hyponatremia Active Problems:   Greater trochanter fracture (HCC)   Hypokalemia   Hypomagnesemia   Hypochloremia    Discharge Instructions  Discharge Instructions     Increase activity slowly   Complete by: As directed       Allergies as of 04/02/2021       Reactions   Chlorhexidine Gluconate Itching   Don't use Chloraprep inside port a cath kits.   Macrolides And Ketolides Nausea And Vomiting   Tape    Adhesive tape,   Only use paper tape        Medication List     STOP taking these medications    amLODipine 10 MG tablet Commonly known as: NORVASC   atorvastatin 20 MG tablet Commonly known as: LIPITOR   hydrochlorothiazide 25 MG tablet Commonly known as: HYDRODIURIL   ramipril 5 MG capsule Commonly known as: ALTACE       TAKE these medications    ALPRAZolam 1 MG tablet Commonly known as: XANAX Take 0.5 tablets (0.5 mg total) by mouth 2 (two) times daily. For anxiety   Eliquis 5 MG Tabs tablet Generic drug: apixaban TAKE 1 TABLET BY MOUTH TWICE A DAY What changed: how much to take   fluticasone 50 MCG/ACT nasal spray Commonly known as: FLONASE Place 2 sprays into both nostrils daily as needed for allergies or rhinitis.   HYDROcodone-acetaminophen 5-325 MG tablet Commonly known as: NORCO/VICODIN Take 1 tablet by mouth every 4 (four) hours as needed for severe pain.   hyoscyamine 0.375 MG 12 hr tablet Commonly known as: LEVBID Take 0.375 mg by mouth daily as needed for cramping.   IMODIUM PO Take 4 mg by mouth daily as needed (loose stool).   Klor-Con M20 20 MEQ  tablet Generic drug: potassium chloride SA TAKE 1 TABLET BY MOUTH DAILY. (PLEASE KEEP APPT IN OCT. WITH DR. Radford Pax BEFORE ANYMORE REFILLS.) What changed: See the new instructions.   metoprolol tartrate 25 MG tablet Commonly known as: LOPRESSOR Take 25 mg by mouth 2 (two) times daily.   omeprazole 20 MG tablet Commonly known as: PRILOSEC OTC Take 20 mg by mouth daily.   tiZANidine 2 MG tablet Commonly known as: ZANAFLEX Take 2 mg by mouth at bedtime as needed for muscle spasms.   traZODone 50 MG tablet Commonly known as: DESYREL Take 50 mg by mouth at bedtime as needed for sleep.   urea 15 g Pack oral packet Commonly known as: URE-NA Take 30 g by mouth 2 (two) times daily.   VITAMIN B-1 PO Take 250 mg by mouth daily.   vitamin C 100 MG tablet Take 1,000 mg by mouth daily.   Vitamin D3 25 MCG (1000 UT) Caps Take 1,000 Units by mouth daily.   Vitamin E 400 units Tabs Take 400 Units by mouth daily.        Follow-up Information     Nicholes Stairs, MD Follow up in  2 week(s).   Specialty: Orthopedic Surgery Contact information: [address] [place] [address] [phone]                Allergies  Allergen Reactions   Chlorhexidine Gluconate Itching    Don't use Chloraprep inside port a cath kits.   Macrolides And Ketolides Nausea And Vomiting   Tape     Adhesive tape,   Only use paper tape    Consultations: Renal orthopedic   Procedures/Studies: DG Chest 2 View  Result Date: 03/24/2021 CLINICAL DATA:  Weakness. EXAM: CHEST - 2 VIEW COMPARISON:  Radiographs 05/26/2019.  CT 11/06/2005. FINDINGS: The heart size and mediastinal contours are stable with aortic atherosclerosis. There is mild left basilar atelectasis. The lungs otherwise appear clear, without confluent airspace opacity, edema or pleural effusion. No acute fractures are seen. There are mild degenerative changes in the spine. IMPRESSION: No evidence of active  cardiopulmonary process. Electronically Signed   By: Carey Bullocks M.D.   On: 03/24/2021 15:33   DG Shoulder Right  Result Date: 03/24/2021 CLINICAL DATA:  Right shoulder pain after fall. EXAM: RIGHT SHOULDER - 2+ VIEW COMPARISON:  Radiograph 04/05/2020 FINDINGS: There is no evidence of fracture or dislocation. Moderate acromioclavicular degenerative change. Small subacromial spur. There is soft tissue calcifications adjacent to the lateral humeral head, not seen on prior exam. No erosion or bone destruction. IMPRESSION: 1. No fracture or dislocation of the right shoulder. 2. Soft tissue calcifications adjacent to the lateral humeral head, new from prior exam, may represent calcific tendinopathy or bursitis. Electronically Signed   By: Narda Rutherford M.D.   On: 03/24/2021 21:50   CT HEAD WO CONTRAST ( )  Result Date: 03/24/2021 CLINICAL DATA:  Generalized weakness that started 1 week ago. Multiple falls. EXAM: CT HEAD WITHOUT CONTRAST CT CERVICAL SPINE WITHOUT CONTRAST TECHNIQUE: Multidetector CT imaging of the head and cervical spine was performed following the standard protocol without intravenous contrast. Multiplanar CT image reconstructions of the cervical spine were also generated. COMPARISON:  None. FINDINGS: CT HEAD FINDINGS Brain: There is no evidence of acute intracranial hemorrhage, mass lesion, brain edema or extra-axial fluid collection. Mild atrophy with prominence of the ventricles and subarachnoid spaces. The dilatation of the lateral and 3rd ventricles is in slight excess of the sulcal prominence, but there is no definite hydrocephalus. Mild patchy [name]-density in the periventricular white matter likely reflects chronic small vessel ischemic changes. There is no CT evidence of acute cortical infarction. Vascular: Intracranial vascular calcifications. No hyperdense vessel identified. Skull: Negative for fracture or focal lesion. Sinuses/Orbits: The visualized paranasal sinuses and  mastoid air cells are clear. No orbital abnormalities are seen. Other: None. CT CERVICAL SPINE FINDINGS Alignment: Normal. Skull base and vertebrae: No evidence of acute cervical spine fracture or traumatic subluxation. Soft tissues and spinal canal: No prevertebral fluid or swelling. No visible canal hematoma. There is synovial calcifications surrounding the odontoid process without osseous erosion or mass effect on the craniocervical junction. Disc levels: Multilevel cervical spondylosis with disc bulging, uncinate spurring and facet hypertrophy. There is mild to moderate resulting osseous foraminal narrowing which appears worst at the C4-5 and C6-7 levels. Upper chest: Emphysematous changes and mild scarring at both lung apices. Severe bilateral carotid atherosclerosis. Other: None. IMPRESSION: 1. No acute intracranial or calvarial findings. 2. Atrophy and mild chronic small vessel ischemic changes. 3. No evidence of acute cervical spine fracture, traumatic subluxation or static signs of instability. 4. Multilevel cervical spondylosis. Electronically Signed  By: Richardean Sale M.D.   On: 03/24/2021 15:59   CT Cervical Spine Wo Contrast  Result Date: 03/24/2021 CLINICAL DATA:  Generalized weakness that started 1 week ago. Multiple falls. EXAM: CT HEAD WITHOUT CONTRAST CT CERVICAL SPINE WITHOUT CONTRAST TECHNIQUE: Multidetector CT imaging of the head and cervical spine was performed following the standard protocol without intravenous contrast. Multiplanar CT image reconstructions of the cervical spine were also generated. COMPARISON:  None. FINDINGS: CT HEAD FINDINGS Brain: There is no evidence of acute intracranial hemorrhage, mass lesion, brain edema or extra-axial fluid collection. Mild atrophy with prominence of the ventricles and subarachnoid spaces. The dilatation of the lateral and 3rd ventricles is in slight excess of the sulcal prominence, but there is no definite hydrocephalus. Mild patchy  [name]-density in the periventricular white matter likely reflects chronic small vessel ischemic changes. There is no CT evidence of acute cortical infarction. Vascular: Intracranial vascular calcifications. No hyperdense vessel identified. Skull: Negative for fracture or focal lesion. Sinuses/Orbits: The visualized paranasal sinuses and mastoid air cells are clear. No orbital abnormalities are seen. Other: None. CT CERVICAL SPINE FINDINGS Alignment: Normal. Skull base and vertebrae: No evidence of acute cervical spine fracture or traumatic subluxation. Soft tissues and spinal canal: No prevertebral fluid or swelling. No visible canal hematoma. There is synovial calcifications surrounding the odontoid process without osseous erosion or mass effect on the craniocervical junction. Disc levels: Multilevel cervical spondylosis with disc bulging, uncinate spurring and facet hypertrophy. There is mild to moderate resulting osseous foraminal narrowing which appears worst at the C4-5 and C6-7 levels. Upper chest: Emphysematous changes and mild scarring at both lung apices. Severe bilateral carotid atherosclerosis. Other: None. IMPRESSION: 1. No acute intracranial or calvarial findings. 2. Atrophy and mild chronic small vessel ischemic changes. 3. No evidence of acute cervical spine fracture, traumatic subluxation or static signs of instability. 4. Multilevel cervical spondylosis. Electronically Signed   By: Richardean Sale M.D.   On: 03/24/2021 15:59   MR SHOULDER RIGHT WO CONTRAST  Result Date: 03/29/2021 CLINICAL DATA:  Right shoulder pain and limited range of motion. EXAM: MRI OF THE RIGHT SHOULDER WITHOUT CONTRAST TECHNIQUE: Multiplanar, multisequence MR imaging of the shoulder was performed. No intravenous contrast was administered. COMPARISON:  Right shoulder x-ray dated March 24, 2021. FINDINGS: Limited study due to required protocol modifications related to patient's EVAR stent graft. Fluid sensitive sequences  could not be obtained. Rotator cuff: Severe supraspinatus tendinosis with fraying at the insertion but no discrete tear. Intact infraspinatus, subscapularis, and teres minor tendons. Muscles: No atrophy or abnormal signal of the muscles of the rotator cuff. Biceps long head:  Intact and normally positioned. Acromioclavicular Joint: Moderate arthropathy of the acromioclavicular joint. Type I acromion. No significant subacromial/subdeltoid bursal fluid. Glenohumeral Joint: No joint effusion. No chondral defect. Labrum: Grossly intact, but evaluation is limited by lack of intraarticular fluid. Bones:  No marrow abnormality, fracture or dislocation. Other: None. IMPRESSION: 1. Limited study due to required protocol modifications related to patient's EVAR stent graft. 2. Severe supraspinatus tendinosis with fraying at the insertion but no discrete tear. 3. Moderate acromioclavicular osteoarthritis. Electronically Signed   By: Titus Dubin M.D.   On: 03/29/2021 10:16   DG Shoulder Left Port  Result Date: 03/30/2021 CLINICAL DATA:  Left shoulder pain EXAM: LEFT SHOULDER COMPARISON:  None. FINDINGS: There is no evidence of fracture or dislocation. There is no evidence of arthropathy or other focal bone abnormality. Soft tissues are unremarkable. Likely vascular calcifications. IMPRESSION: No acute displaced  fracture or dislocation. Electronically Signed   By: Iven Finn M.D.   On: 03/30/2021 15:22   DG Hip Unilat W or Wo Pelvis 2-3 Views Left  Result Date: 03/24/2021 CLINICAL DATA:  Weakness. EXAM: DG HIP (WITH OR WITHOUT PELVIS) 2-3V LEFT COMPARISON:  Abdominal radiographs 08/02/2016. Abdominopelvic CT 03/09/2013. FINDINGS: The mineralization and alignment are normal. There are advanced degenerative changes of both hips which have progressed compared with the prior studies. There are associated osteophytes, subchondral cysts and subchondral sclerosis. There is a nondisplaced fracture of the left greater  trochanter. No definite extension of this fracture cross the femoral neck or into the intertrochanteric region is identified. There are diffuse vascular calcifications status post aorto bi-iliac stent grafting. Patient is status post ventral hernia repair. IMPRESSION: 1. Acute fracture of the left greater trochanter. If the patient is unable to bear weight or there is additional clinical concern for intertrochanteric extension of this fracture, consider further evaluation with CT. 2. Advanced osteoarthritic changes of both hips. Electronically Signed   By: Richardean Sale M.D.   On: 03/24/2021 15:37   US Abdomen Limited RUQ (LIVER/GB)  Result Date: 04/01/2021 CLINICAL DATA:  Elevated liver function tests. Remote cholecystectomy. EXAM: ULTRASOUND ABDOMEN LIMITED RIGHT UPPER QUADRANT COMPARISON:  03/26/2013 CT abdomen/pelvis FINDINGS: Gallbladder: Surgically absent. Common bile duct: Diameter: 5 mm Liver: No focal lesion identified. Within normal limits in parenchymal echogenicity. Portal vein is patent on color Doppler imaging with normal direction of blood flow towards the liver. Other: None. IMPRESSION: 1. Normal liver.  No liver masses. 2. No biliary ductal dilatation status post cholecystectomy. Electronically Signed   By: Ilona Sorrel M.D.   On: 04/01/2021 09:39      Subjective:  Reports good bowel movement, no nausea, no vomiting, reports that diet is okay, pain at left hip is currently controlled.  He is with chronic bilateral shoulder pain, but movement has significantly improved injection by orthopedic.  Discharge Exam: Vitals:   04/02/21 0222 04/02/21 0839  BP: 101/69 108/71  Pulse: 84 71  Resp: 14 14  Temp: 97.6 F (36.4 C)   SpO2: 98% 98%   Vitals:   04/02/21 0056 04/02/21 0222 04/02/21 0222 04/02/21 0839  BP: 112/69  101/69 108/71  Pulse: 69  84 71  Resp:   14 14  Temp:   97.6 F (36.4 C)   TempSrc:   Oral   SpO2:   98% 98%  Weight:  82.7 kg    Height:  6\' 1"  (1.854 m)       General: Pt is alert, awake, not in acute distress frail, deconditioned, chronically ill-appearing. Cardiovascular: RRR, S1/S2 +, no rubs, no gallops Respiratory: CTA bilaterally, no wheezing, no rhonchi Abdominal: Soft, NT, ND, bowel sounds + Extremities: no edema, no cyanosis, left hip bruise/ecchymosis at fracture site stable within demarcated lines.    The results of significant diagnostics from this hospitalization (including imaging, microbiology, ancillary and laboratory) are listed below for reference.     Microbiology: Recent Results (from the past 240 hour(s))  Resp Panel by RT-PCR (Flu A&B, Covid) Nasopharyngeal Swab     Status: None   Collection Time: 03/24/21  7:38 PM   Specimen: Nasopharyngeal Swab; Nasopharyngeal(NP) swabs in vial transport medium  Result Value Ref Range Status   SARS Coronavirus 2 by RT PCR NEGATIVE NEGATIVE Final    Comment: (NOTE) SARS-CoV-2 target nucleic acids are NOT DETECTED.  The SARS-CoV-2 RNA is generally detectable in upper respiratory specimens during the acute phase  of infection. The lowest concentration of SARS-CoV-2 viral copies this assay can detect is 138 copies/mL. A negative result does not preclude SARS-Cov-2 infection and should not be used as the sole basis for treatment or other patient management decisions. A negative result may occur with  improper specimen collection/handling, submission of specimen other than nasopharyngeal swab, presence of viral mutation(s) within the areas targeted by this assay, and inadequate number of viral copies(<138 copies/mL). A negative result must be combined with clinical observations, patient history, and epidemiological information. The expected result is Negative.  Fact Sheet for Patients:  EntrepreneurPulse.com.au  Fact Sheet for Healthcare Providers:  IncredibleEmployment.be  This test is no t yet approved or cleared by the [place] FDA  and  has been authorized for detection and/or diagnosis of SARS-CoV-2 by FDA under an Emergency Use Authorization (EUA). This EUA will remain  in effect (meaning this test can be used) for the duration of the COVID-19 declaration under Section 564(b)(1) of the Act, 21 U.S.C.section 360bbb-3(b)(1), unless the authorization is terminated  or revoked sooner.       Influenza A by PCR NEGATIVE NEGATIVE Final   Influenza B by PCR NEGATIVE NEGATIVE Final    Comment: (NOTE) The Xpert Xpress SARS-CoV-2/FLU/RSV plus assay is intended as an aid in the diagnosis of influenza from Nasopharyngeal swab specimens and should not be used as a sole basis for treatment. Nasal washings and aspirates are unacceptable for Xpert Xpress SARS-CoV-2/FLU/RSV testing.  Fact Sheet for Patients: EntrepreneurPulse.com.au  Fact Sheet for Healthcare Providers: IncredibleEmployment.be  This test is not yet approved or cleared by the [place] FDA and has been authorized for detection and/or diagnosis of SARS-CoV-2 by FDA under an Emergency Use Authorization (EUA). This EUA will remain in effect (meaning this test can be used) for the duration of the COVID-19 declaration under Section 564(b)(1) of the Act, 21 U.S.C. section 360bbb-3(b)(1), unless the authorization is terminated or revoked.  Performed at [facility], [place] [address] [place], [place] [address]   SARS CORONAVIRUS 2 (TAT 6-24 HRS) Nasopharyngeal Nasopharyngeal Swab     Status: None   Collection Time: 04/01/21  1:39 PM   Specimen: Nasopharyngeal Swab  Result Value Ref Range Status   SARS Coronavirus 2 NEGATIVE NEGATIVE Final    Comment: (NOTE) SARS-CoV-2 target nucleic acids are NOT DETECTED.  The SARS-CoV-2 RNA is generally detectable in upper and lower respiratory specimens during the acute phase of infection. Negative results do not preclude SARS-CoV-2 infection, do not rule out co-infections  with other pathogens, and should not be used as the sole basis for treatment or other patient management decisions. Negative results must be combined with clinical observations, patient history, and epidemiological information. The expected result is Negative.  Fact Sheet for Patients: SugarRoll.be  Fact Sheet for Healthcare Providers: https://www.woods-mathews.com/  This test is not yet approved or cleared by the [place] FDA and  has been authorized for detection and/or diagnosis of SARS-CoV-2 by FDA under an Emergency Use Authorization (EUA). This EUA will remain  in effect (meaning this test can be used) for the duration of the COVID-19 declaration under Se ction 564(b)(1) of the Act, 21 U.S.C. section 360bbb-3(b)(1), unless the authorization is terminated or revoked sooner.  Performed at [facility], [place] [address], [place], [place] [address]      Labs: BNP (last 3 results) Recent Labs    03/30/21 0254 03/31/21 0406 04/01/21 0522  BNP 485.6* 240.7* [id number]*   Basic Metabolic  Panel: Recent Labs  Lab 03/26/21 1631 03/27/21 0337 03/28/21 0350 03/28/21 0831 03/29/21 0333 03/29/21 1254 03/30/21 0254 03/31/21 0406 04/01/21 0522 04/02/21 0732  NA 124*   < > 125*   < > 128* 129* 130* 131* 129* 128*  K 4.0   < > 3.7  --  3.9  --  4.0 4.3 4.3 4.3  CL 91*   < > 93*  --  96*  --  98 96* 95* 92*  CO2 25   < > 23  --  24  --  26 26 27 27   GLUCOSE 112*   < > 109*  --  112*  --  116* 103* 104* 98  BUN 12   < > 49*  --  44*  --  83* 72* 51* 43*  CREATININE 1.22   < > 1.02  --  1.23  --  1.19 1.18 1.04 1.06  CALCIUM 8.6*   < > 9.0  --  8.9  --  9.1 9.1 8.8* 8.9  MG  --    < > 1.9  --  2.0  --  2.1 2.0 2.1  --   PHOS 2.5  --   --   --   --   --   --   --   --   --    < > = values in this interval not displayed.   Liver Function Tests: Recent Labs  Lab 03/29/21 0333 03/30/21 0254 03/31/21 0406 04/01/21 0522  04/02/21 0732  AST 53* 144* 249* 180* 75*  ALT 60* 143* 250* 250* 170*  ALKPHOS 50 57 70 72 83  BILITOT 1.2 1.1 1.2 1.2 1.5*  PROT 5.8* 5.6* 5.8* 5.9* 5.7*  ALBUMIN 2.7* 2.6* 2.6* 2.6* 2.6*   No results for input(s): LIPASE, AMYLASE in the last 168 hours. No results for input(s): AMMONIA in the last 168 hours. CBC: Recent Labs  Lab 03/28/21 0350 03/29/21 0333 03/30/21 0254 03/31/21 0406 04/01/21 0522  WBC 9.1 8.2 7.5 6.7 5.5  NEUTROABS 7.0 5.8 5.2 4.5 3.6  HGB 10.6* 10.6* 10.5* 10.4* 10.6*  HCT 29.2* 30.1* 29.7* 29.7* 31.2*  MCV 94.2 95.9 95.2 97.4 96.9  PLT 253 258 294 329 333   Cardiac Enzymes: No results for input(s): CKTOTAL, CKMB, CKMBINDEX, TROPONINI in the last 168 hours. BNP: Invalid input(s): POCBNP CBG: No results for input(s): GLUCAP in the last 168 hours. D-Dimer No results for input(s): DDIMER in the last 72 hours. Hgb A1c No results for input(s): HGBA1C in the last 72 hours. Lipid Profile No results for input(s): CHOL, HDL, LDLCALC, TRIG, CHOLHDL, LDLDIRECT in the last 72 hours. Thyroid function studies No results for input(s): TSH, T4TOTAL, T3FREE, THYROIDAB in the last 72 hours.  Invalid input(s): FREET3 Anemia work up No results for input(s): VITAMINB12, FOLATE, FERRITIN, TIBC, IRON, RETICCTPCT in the last 72 hours. Urinalysis    Component Value Date/Time   COLORURINE YELLOW 03/24/2021 1404   APPEARANCEUR CLEAR 03/24/2021 1404   LABSPEC 1.009 03/24/2021 1404   PHURINE 7.0 03/24/2021 1404   GLUCOSEU NEGATIVE 03/24/2021 1404   HGBUR SMALL (A) 03/24/2021 1404   BILIRUBINUR NEGATIVE 03/24/2021 1404   KETONESUR 20 (A) 03/24/2021 1404   PROTEINUR 30 (A) 03/24/2021 1404   UROBILINOGEN 0.2 07/15/2007 1041   NITRITE NEGATIVE 03/24/2021 1404   LEUKOCYTESUR NEGATIVE 03/24/2021 1404   Sepsis Labs Invalid input(s): PROCALCITONIN,  WBC,  LACTICIDVEN Microbiology Recent Results (from the past 240 hour(s))  Resp Panel by RT-PCR (Flu A&B,  Covid)  Nasopharyngeal Swab     Status: None   Collection Time: 03/24/21  7:38 PM   Specimen: Nasopharyngeal Swab; Nasopharyngeal(NP) swabs in vial transport medium  Result Value Ref Range Status   SARS Coronavirus 2 by RT PCR NEGATIVE NEGATIVE Final    Comment: (NOTE) SARS-CoV-2 target nucleic acids are NOT DETECTED.  The SARS-CoV-2 RNA is generally detectable in upper respiratory specimens during the acute phase of infection. The lowest concentration of SARS-CoV-2 viral copies this assay can detect is 138 copies/mL. A negative result does not preclude SARS-Cov-2 infection and should not be used as the sole basis for treatment or other patient management decisions. A negative result may occur with  improper specimen collection/handling, submission of specimen other than nasopharyngeal swab, presence of viral mutation(s) within the areas targeted by this assay, and inadequate number of viral copies(<138 copies/mL). A negative result must be combined with clinical observations, patient history, and epidemiological information. The expected result is Negative.  Fact Sheet for Patients:  BloggerCourse.comhttps://www.fda.gov/media/152166/download  Fact Sheet for Healthcare Providers:  SeriousBroker.ithttps://www.fda.gov/media/152162/download  This test is no t yet approved or cleared by the [place] FDA and  has been authorized for detection and/or diagnosis of SARS-CoV-2 by FDA under an Emergency Use Authorization (EUA). This EUA will remain  in effect (meaning this test can be used) for the duration of the COVID-19 declaration under Section 564(b)(1) of the Act, 21 U.S.C.section 360bbb-3(b)(1), unless the authorization is terminated  or revoked sooner.       Influenza A by PCR NEGATIVE NEGATIVE Final   Influenza B by PCR NEGATIVE NEGATIVE Final    Comment: (NOTE) The Xpert Xpress SARS-CoV-2/FLU/RSV plus assay is intended as an aid in the diagnosis of influenza from Nasopharyngeal swab specimens and should not be  used as a sole basis for treatment. Nasal washings and aspirates are unacceptable for Xpert Xpress SARS-CoV-2/FLU/RSV testing.  Fact Sheet for Patients: BloggerCourse.comhttps://www.fda.gov/media/152166/download  Fact Sheet for Healthcare Providers: SeriousBroker.ithttps://www.fda.gov/media/152162/download  This test is not yet approved or cleared by the [place] FDA and has been authorized for detection and/or diagnosis of SARS-CoV-2 by FDA under an Emergency Use Authorization (EUA). This EUA will remain in effect (meaning this test can be used) for the duration of the COVID-19 declaration under Section 564(b)(1) of the Act, 21 U.S.C. section 360bbb-3(b)(1), unless the authorization is terminated or revoked.  Performed at [facility], [address] [place], [place] [address]   SARS CORONAVIRUS 2 (TAT 6-24 HRS) Nasopharyngeal Nasopharyngeal Swab     Status: None   Collection Time: 04/01/21  1:39 PM   Specimen: Nasopharyngeal Swab  Result Value Ref Range Status   SARS Coronavirus 2 NEGATIVE NEGATIVE Final    Comment: (NOTE) SARS-CoV-2 target nucleic acids are NOT DETECTED.  The SARS-CoV-2 RNA is generally detectable in upper and lower respiratory specimens during the acute phase of infection. Negative results do not preclude SARS-CoV-2 infection, do not rule out co-infections with other pathogens, and should not be used as the sole basis for treatment or other patient management decisions. Negative results must be combined with clinical observations, patient history, and epidemiological information. The expected result is Negative.  Fact Sheet for Patients: HairSlick.nohttps://www.fda.gov/media/138098/download  Fact Sheet for Healthcare Providers: quierodirigir.comhttps://www.fda.gov/media/138095/download  This test is not yet approved or cleared by the [place] FDA and  has been authorized for detection and/or diagnosis of SARS-CoV-2 by FDA under an Emergency Use Authorization (EUA). This EUA will remain  in  effect (meaning this test  can be used) for the duration of the COVID-19 declaration under Se ction 564(b)(1) of the Act, 21 U.S.C. section 360bbb-3(b)(1), unless the authorization is terminated or revoked sooner.  Performed at [facility], [address], [place], [place] [address]      Time coordinating discharge: Over 30 minutes  SIGNED:   Huey Bienenstock, MD  [facility] 04/02/2021, 10:55 AM Pager   If 7PM-7AM, please contact night-coverage [email or web address] Password [email or web address]

## 2021-04-02 NOTE — Progress Notes (Signed)
[employer] called. Report given to Diane. All questions answered.

## 2021-04-05 ENCOUNTER — Other Ambulatory Visit: Payer: Self-pay | Admitting: *Deleted

## 2021-04-05 NOTE — Patient Outreach (Signed)
Member screened for potential [facility] Care Management needs. Mr. Gaumer recently admitted to [facility].  Collaboration with SNF SW to make aware that writer is following for transition plans and potential [facility] services.  Raiford Noble, MSN, RN,BSN [facility] Post Acute Care Coordinator [phone] [facility]) [phone]  (Toll free office)

## 2021-05-01 ENCOUNTER — Other Ambulatory Visit: Payer: Self-pay | Admitting: *Deleted

## 2021-05-01 NOTE — Patient Outreach (Signed)
[facility] Post-Acute Care Coordinator follow up. Member screened for potential [facility] Care Management needs. Per [facility] Mr. Lowenthal transitioned from [facility] on 04/28/21 [facility]F [facility]   No identifiable [facility] Care Management needs at this time.    Raiford Noble, MSN, RN,BSN [facility] Post Acute Care Coordinator [phone] [facility]) [phone]  (Toll free office)

## 2021-05-17 ENCOUNTER — Other Ambulatory Visit: Payer: Self-pay | Admitting: Oncology

## 2021-05-18 ENCOUNTER — Inpatient Hospital Stay: Payer: Medicare Other | Attending: Oncology

## 2021-06-15 ENCOUNTER — Other Ambulatory Visit: Payer: Self-pay | Admitting: Cardiology

## 2021-06-16 ENCOUNTER — Other Ambulatory Visit: Payer: Self-pay | Admitting: Cardiology

## 2021-06-19 ENCOUNTER — Encounter: Payer: Self-pay | Admitting: Oncology

## 2021-07-10 ENCOUNTER — Other Ambulatory Visit: Payer: Self-pay | Admitting: Cardiology

## 2021-07-26 ENCOUNTER — Other Ambulatory Visit: Payer: Self-pay | Admitting: Cardiology

## 2021-08-07 ENCOUNTER — Other Ambulatory Visit: Payer: Self-pay | Admitting: Cardiology

## 2021-08-15 ENCOUNTER — Other Ambulatory Visit: Payer: Self-pay

## 2021-08-16 ENCOUNTER — Inpatient Hospital Stay: Payer: Medicare Other

## 2021-08-23 ENCOUNTER — Inpatient Hospital Stay: Payer: Medicare Other | Admitting: Hematology and Oncology

## 2021-08-25 ENCOUNTER — Observation Stay (HOSPITAL_COMMUNITY): Payer: Medicare Other

## 2021-08-25 ENCOUNTER — Other Ambulatory Visit: Payer: Self-pay

## 2021-08-25 ENCOUNTER — Emergency Department (HOSPITAL_COMMUNITY): Payer: Medicare Other

## 2021-08-25 ENCOUNTER — Encounter (HOSPITAL_COMMUNITY): Payer: Self-pay | Admitting: *Deleted

## 2021-08-25 ENCOUNTER — Observation Stay (HOSPITAL_BASED_OUTPATIENT_CLINIC_OR_DEPARTMENT_OTHER): Payer: Medicare Other

## 2021-08-25 ENCOUNTER — Inpatient Hospital Stay (HOSPITAL_COMMUNITY)
Admission: EM | Admit: 2021-08-25 | Discharge: 2021-08-29 | DRG: 101 | Disposition: A | Discharge status: Home Health | Payer: Medicare Other | Attending: Internal Medicine | Admitting: Internal Medicine

## 2021-08-25 DIAGNOSIS — B9620 Unspecified Escherichia coli [E. coli] as the cause of diseases classified elsewhere: Secondary | ICD-10-CM | POA: Diagnosis present

## 2021-08-25 DIAGNOSIS — Z8679 Personal history of other diseases of the circulatory system: Secondary | ICD-10-CM

## 2021-08-25 DIAGNOSIS — Z881 Allergy status to other antibiotic agents status: Secondary | ICD-10-CM

## 2021-08-25 DIAGNOSIS — I10 Essential (primary) hypertension: Secondary | ICD-10-CM | POA: Diagnosis present

## 2021-08-25 DIAGNOSIS — I4821 Permanent atrial fibrillation: Secondary | ICD-10-CM | POA: Diagnosis present

## 2021-08-25 DIAGNOSIS — I472 Ventricular tachycardia, unspecified: Secondary | ICD-10-CM | POA: Diagnosis present

## 2021-08-25 DIAGNOSIS — F419 Anxiety disorder, unspecified: Secondary | ICD-10-CM | POA: Diagnosis present

## 2021-08-25 DIAGNOSIS — I350 Nonrheumatic aortic (valve) stenosis: Secondary | ICD-10-CM | POA: Diagnosis present

## 2021-08-25 DIAGNOSIS — N289 Disorder of kidney and ureter, unspecified: Secondary | ICD-10-CM

## 2021-08-25 DIAGNOSIS — Z8673 Personal history of transient ischemic attack (TIA), and cerebral infarction without residual deficits: Secondary | ICD-10-CM

## 2021-08-25 DIAGNOSIS — I4729 Other ventricular tachycardia: Secondary | ICD-10-CM | POA: Diagnosis present

## 2021-08-25 DIAGNOSIS — I4891 Unspecified atrial fibrillation: Secondary | ICD-10-CM | POA: Diagnosis present

## 2021-08-25 DIAGNOSIS — R0902 Hypoxemia: Secondary | ICD-10-CM | POA: Diagnosis present

## 2021-08-25 DIAGNOSIS — Z87891 Personal history of nicotine dependence: Secondary | ICD-10-CM

## 2021-08-25 DIAGNOSIS — R569 Unspecified convulsions: Secondary | ICD-10-CM

## 2021-08-25 DIAGNOSIS — K219 Gastro-esophageal reflux disease without esophagitis: Secondary | ICD-10-CM | POA: Diagnosis present

## 2021-08-25 DIAGNOSIS — M25511 Pain in right shoulder: Secondary | ICD-10-CM | POA: Diagnosis not present

## 2021-08-25 DIAGNOSIS — N400 Enlarged prostate without lower urinary tract symptoms: Secondary | ICD-10-CM | POA: Diagnosis present

## 2021-08-25 DIAGNOSIS — Z79899 Other long term (current) drug therapy: Secondary | ICD-10-CM

## 2021-08-25 DIAGNOSIS — I517 Cardiomegaly: Secondary | ICD-10-CM | POA: Diagnosis present

## 2021-08-25 DIAGNOSIS — M25519 Pain in unspecified shoulder: Secondary | ICD-10-CM | POA: Clinically undetermined

## 2021-08-25 DIAGNOSIS — I714 Abdominal aortic aneurysm, without rupture, unspecified: Secondary | ICD-10-CM | POA: Diagnosis present

## 2021-08-25 DIAGNOSIS — G40409 Other generalized epilepsy and epileptic syndromes, not intractable, without status epilepticus: Principal | ICD-10-CM | POA: Diagnosis present

## 2021-08-25 DIAGNOSIS — G9340 Encephalopathy, unspecified: Secondary | ICD-10-CM | POA: Diagnosis present

## 2021-08-25 DIAGNOSIS — E785 Hyperlipidemia, unspecified: Secondary | ICD-10-CM | POA: Diagnosis present

## 2021-08-25 DIAGNOSIS — N390 Urinary tract infection, site not specified: Secondary | ICD-10-CM | POA: Diagnosis present

## 2021-08-25 DIAGNOSIS — F0394 Unspecified dementia, unspecified severity, with anxiety: Secondary | ICD-10-CM | POA: Diagnosis present

## 2021-08-25 DIAGNOSIS — Z888 Allergy status to other drugs, medicaments and biological substances status: Secondary | ICD-10-CM

## 2021-08-25 DIAGNOSIS — R1310 Dysphagia, unspecified: Secondary | ICD-10-CM | POA: Diagnosis present

## 2021-08-25 DIAGNOSIS — Z8249 Family history of ischemic heart disease and other diseases of the circulatory system: Secondary | ICD-10-CM

## 2021-08-25 DIAGNOSIS — Z7901 Long term (current) use of anticoagulants: Secondary | ICD-10-CM

## 2021-08-25 DIAGNOSIS — Z66 Do not resuscitate: Secondary | ICD-10-CM | POA: Diagnosis present

## 2021-08-25 LAB — CBC WITH DIFFERENTIAL/PLATELET
Abs Immature Granulocytes: 0.05 10*3/uL (ref 0.00–0.07)
Basophils Absolute: 0 10*3/uL (ref 0.0–0.1)
Basophils Relative: 0 %
Eosinophils Absolute: 0 10*3/uL (ref 0.0–0.5)
Eosinophils Relative: 0 %
HCT: 37.8 % — ABNORMAL LOW (ref 39.0–52.0)
Hemoglobin: 12.7 g/dL — ABNORMAL LOW (ref 13.0–17.0)
Immature Granulocytes: 1 %
Lymphocytes Relative: 6 %
Lymphs Abs: 0.5 10*3/uL — ABNORMAL LOW (ref 0.7–4.0)
MCH: 32.6 pg (ref 26.0–34.0)
MCHC: 33.6 g/dL (ref 30.0–36.0)
MCV: 97.2 fL (ref 80.0–100.0)
Monocytes Absolute: 0.4 10*3/uL (ref 0.1–1.0)
Monocytes Relative: 5 %
Neutro Abs: 8.6 10*3/uL — ABNORMAL HIGH (ref 1.7–7.7)
Neutrophils Relative %: 88 %
Platelets: 176 10*3/uL (ref 150–400)
RBC: 3.89 MIL/uL — ABNORMAL LOW (ref 4.22–5.81)
RDW: 13.6 % (ref 11.5–15.5)
WBC: 9.7 10*3/uL (ref 4.0–10.5)
nRBC: 0 % (ref 0.0–0.2)

## 2021-08-25 LAB — ETHANOL: Alcohol, Ethyl (B): <10 mg/dL (ref <10)

## 2021-08-25 LAB — ECHOCARDIOGRAM COMPLETE
AR max vel: 0.74 cm2
AV Area VTI: 0.63 cm2
AV Area mean vel: 0.61 cm2
AV Mean grad: 19 mmHg
AV Peak grad: 30.7 mmHg
Ao pk vel: 2.77 m/s
Height: 73 in
S' Lateral: 3 cm
Weight: 3520 oz

## 2021-08-25 LAB — CK: Total CK: 163 U/L (ref 49–397)

## 2021-08-25 LAB — AMMONIA: Ammonia: 17 umol/L (ref 9–35)

## 2021-08-25 LAB — COMPREHENSIVE METABOLIC PANEL
ALT: 15 U/L (ref 0–44)
AST: 23 U/L (ref 15–41)
Albumin: 3.7 g/dL (ref 3.5–5.0)
Alkaline Phosphatase: 65 U/L (ref 38–126)
Anion gap: 13 (ref 5–15)
BUN: 19 mg/dL (ref 8–23)
CO2: 21 mmol/L — ABNORMAL LOW (ref 22–32)
Calcium: 9.2 mg/dL (ref 8.9–10.3)
Chloride: 98 mmol/L (ref 98–111)
Creatinine, Ser: 1.29 mg/dL — ABNORMAL HIGH (ref 0.61–1.24)
GFR, Estimated: 55 mL/min — ABNORMAL LOW (ref >60)
Glucose, Bld: 126 mg/dL — ABNORMAL HIGH (ref 70–99)
Potassium: 4.3 mmol/L (ref 3.5–5.1)
Sodium: 132 mmol/L — ABNORMAL LOW (ref 135–145)
Total Bilirubin: 1.3 mg/dL — ABNORMAL HIGH (ref 0.3–1.2)
Total Protein: 6.2 g/dL — ABNORMAL LOW (ref 6.5–8.1)

## 2021-08-25 LAB — MAGNESIUM: Magnesium: 1.9 mg/dL (ref 1.7–2.4)

## 2021-08-25 LAB — TSH: TSH: 0.925 u[IU]/mL (ref 0.350–4.500)

## 2021-08-25 LAB — CBG MONITORING, ED: Glucose-Capillary: 139 mg/dL — ABNORMAL HIGH (ref 70–99)

## 2021-08-25 MED ORDER — METOPROLOL TARTRATE 25 MG PO TABS
25.0000 mg | ORAL_TABLET | Freq: Two times a day (BID) | ORAL | Status: DC
Start: 2021-08-25 — End: 2021-08-25

## 2021-08-25 MED ORDER — ALPRAZOLAM 0.25 MG PO TABS: 0.5000 mg | ORAL_TABLET | Freq: Two times a day (BID) | ORAL | Status: DC | PRN

## 2021-08-25 MED ORDER — SODIUM CHLORIDE 0.9 % IV SOLN
INTRAVENOUS | Status: AC
Start: 2021-08-25 — End: 2021-08-26

## 2021-08-25 MED ORDER — DIAZEPAM 5 MG/ML IJ SOLN
2.5000 mg | Freq: Once | INTRAMUSCULAR | Status: AC | PRN
  Administered 2021-08-26: 2.5 mg via INTRAVENOUS
  Filled 2021-08-25: qty 2

## 2021-08-25 MED ORDER — APIXABAN 5 MG PO TABS: 5.0000 mg | ORAL_TABLET | Freq: Two times a day (BID) | ORAL | Status: DC

## 2021-08-25 MED ORDER — TAMSULOSIN HCL 0.4 MG PO CAPS
0.4000 mg | ORAL_CAPSULE | Freq: Every day | ORAL | Status: DC
  Administered 2021-08-26 – 2021-08-28 (×3): 0.4 mg via ORAL
  Filled 2021-08-25 (×3): qty 1

## 2021-08-25 MED ORDER — ALPRAZOLAM 0.25 MG PO TABS: 0.5000 mg | ORAL_TABLET | Freq: Two times a day (BID) | ORAL | Status: DC

## 2021-08-25 MED ORDER — ACETAMINOPHEN 325 MG PO TABS
650.0000 mg | ORAL_TABLET | Freq: Four times a day (QID) | ORAL | Status: DC | PRN
  Administered 2021-08-28 (×2): 650 mg via ORAL
  Filled 2021-08-25 (×2): qty 2

## 2021-08-25 MED ORDER — MAGNESIUM SULFATE 2 GM/50ML IV SOLN
INTRAVENOUS | Status: AC
  Administered 2021-08-25: 2 g
  Filled 2021-08-25: qty 50

## 2021-08-25 MED ORDER — ACETAMINOPHEN 650 MG RE SUPP: 650.0000 mg | Freq: Four times a day (QID) | RECTAL | Status: DC | PRN

## 2021-08-25 MED ORDER — LEVETIRACETAM IN NACL 500 MG/100ML IV SOLN
500.0000 mg | Freq: Two times a day (BID) | INTRAVENOUS | Status: DC
  Filled 2021-08-25: qty 100

## 2021-08-25 MED ORDER — SODIUM CHLORIDE 0.9 % IV BOLUS
1000.0000 mL | Freq: Once | INTRAVENOUS | Status: AC
  Administered 2021-08-25: 1000 mL via INTRAVENOUS

## 2021-08-25 MED ORDER — ONDANSETRON HCL 4 MG/2ML IJ SOLN
INTRAMUSCULAR | Status: AC
  Filled 2021-08-25: qty 2

## 2021-08-25 MED ORDER — ATORVASTATIN CALCIUM 10 MG PO TABS
20.0000 mg | ORAL_TABLET | Freq: Every evening | ORAL | Status: DC
  Administered 2021-08-26 – 2021-08-28 (×3): 20 mg via ORAL
  Filled 2021-08-25 (×3): qty 2

## 2021-08-25 MED ORDER — MAGNESIUM SULFATE IN D5W 1-5 GM/100ML-% IV SOLN: 1.0000 g | Freq: Once | INTRAVENOUS | Status: DC

## 2021-08-25 MED ORDER — RAMIPRIL 5 MG PO CAPS: 5.0000 mg | ORAL_CAPSULE | Freq: Every day | ORAL | Status: DC

## 2021-08-25 MED ORDER — LORAZEPAM 2 MG/ML IJ SOLN
INTRAMUSCULAR | Status: AC
  Filled 2021-08-25: qty 1

## 2021-08-25 MED ORDER — LEVETIRACETAM IN NACL 1000 MG/100ML IV SOLN
1000.0000 mg | Freq: Once | INTRAVENOUS | Status: AC
  Administered 2021-08-25: 1000 mg via INTRAVENOUS
  Filled 2021-08-25: qty 100

## 2021-08-25 MED ORDER — ALBUTEROL SULFATE (2.5 MG/3ML) 0.083% IN NEBU: 2.5000 mg | INHALATION_SOLUTION | Freq: Four times a day (QID) | RESPIRATORY_TRACT | Status: DC | PRN

## 2021-08-25 MED ORDER — LEVETIRACETAM IN NACL 500 MG/100ML IV SOLN
500.0000 mg | Freq: Two times a day (BID) | INTRAVENOUS | Status: DC
  Administered 2021-08-25 – 2021-08-26 (×3): 500 mg via INTRAVENOUS
  Filled 2021-08-25 (×3): qty 100

## 2021-08-25 MED ORDER — QUETIAPINE FUMARATE 25 MG PO TABS
25.0000 mg | ORAL_TABLET | Freq: Every day | ORAL | Status: DC
  Administered 2021-08-26 – 2021-08-28 (×3): 25 mg via ORAL
  Filled 2021-08-25 (×3): qty 1

## 2021-08-25 MED ORDER — FUROSEMIDE 20 MG PO TABS: 40.0000 mg | ORAL_TABLET | Freq: Every day | ORAL | Status: DC

## 2021-08-25 MED ORDER — DIAZEPAM 5 MG/ML IJ SOLN: 2.5000 mg | INTRAMUSCULAR | Status: DC | PRN

## 2021-08-25 NOTE — H&P (Addendum)
?History and Physical  ? ? ?Patient: Eugene [name] [id number] DOB: 08-Jan-1936 ?DOA: 08/25/2021 ?DOS: the patient was seen and examined on 08/25/2021 ?PCP: [name], MD  ?Patient coming from: Home via EMS ? ?Chief Complaint:  ?Chief Complaint  ?Patient presents with  ? Fall  ?  [place]  ? Seizures  ? ?HPI: Eugene [name] is a 86 y.o. male with medical history significant of hypertension, hyperlipidemia, atrial fibrillation, CVA, AAA s/p stenting, and anxiety presents after being witnessed by his home caregiver having seizure.  History is obtained from the patient's daughter-in-law who is present at bedside as the patient is still confused.  Apparently, he had been found on the floor this morning beside his chair.  The home caregiver had been able to get him back to a chair, but it was then reported that they noticed him having generalized tonic-clonic activity that lasted several minutes.  EMS was called and patient was noted to be confused.  At baseline patient ambulates with the use of a walker and is normally alert and oriented x3. He has a DO NOT RESUSCITATE order in place that family confirms.  At this time he is not back to his baseline but is more alert and able to say his name.  However, upon further questioning patient just repeats "I am okay" ? ? ?Upon admission into the emergency department patient was seen to be afebrile with pulse 116, and all other vital signs maintained.  Labs significant for hemoglobin 12.7, sodium 132, BUN 19, creatinine 1.29, total bilirubin 1.3, and CK 163.  Chest x-ray noted concern for new cardiomegaly without any acute process appreciated.  CT scan of the head and cervical spine did not note any acute abnormalities.  While in the ED patient had second seizure witnessed by nursing that was described as generalized tonic-clonic lasting approximately 1 minute.  He was noted to be hypoxic temporarily in the 60s and required to be bagged, but O2 saturation currently  maintained on 2 L nasal cannula oxygen.  Thereafter reported to have several short runs of SVT.  Neurology have been formally consulted.  Patient had stat EEG that was suggestive of moderate to severe diffuse encephalopathy with nonspecific etiology.  Patient being given 1 L normal saline IV fluids, magnesium sulfate 2 g IV, and Keppra 1 g IV. ? ? ?Review of Systems: As mentioned in the history of present illness. All other systems reviewed and are negative. ?Past Medical History:  ?Diagnosis Date  ? AAA (abdominal aortic aneurysm) 06/29/2007  ? stent graft  ? Aortic stenosis 09/05/2017  ? Mild AS/AR by echo 01/2017.  ? GERD (gastroesophageal reflux disease)   ? Hemochromatosis   ? Hyperlipidemia   ? Hypertension   ? Irritable bowel syndrome   ? Permanent atrial fibrillation ([place])   ? Stroke [facility])   ? Transient Ischemic Attack  ? ?Past Surgical History:  ?Procedure Laterality Date  ? ABDOMINAL AORTIC ANEURYSM REPAIR    ? ENDOVASCULAR STENT INSERTION  07/18/2007  ? saccular infrarenal aortic aneurysm  ? EYE SURGERY Bilateral   ? Cataract    ? IR FLUORO GUIDE CV LINE LEFT  12/08/2018  ? IR FLUORO GUIDE CV LINE RIGHT  02/13/2017  ? IR FLUORO GUIDE CV LINE RIGHT  02/25/2017  ? IR US GUIDE VASC ACCESS RIGHT  02/13/2017  ? PICC LINE PLACE PERIPHERAL [facility] HX)  Oct 21, 2014  ? ?Social History:  reports that he quit smoking about 22 years ago. His smoking  use included cigarettes. He has never used smokeless tobacco. He reports current alcohol use of about 1.0 - 2.0 standard drink per week. He reports that he does not use drugs. ? ?Allergies  ?Allergen Reactions  ? Chlorhexidine Gluconate Itching  ?  Don't use Chloraprep inside port a cath kits.  ? Macrolides And Ketolides Nausea And Vomiting  ? Tape   ?  Adhesive tape,   Only use paper tape  ? ? ?Family History  ?Problem Relation Age of Onset  ? Hypertension Mother   ? Heart disease Mother   ?     AAA, he passed in his 26 s of Heart Diseasse  ? Heart disease Father   ?     AAA  history and he passed at age 22  of   Disease  ? ? ?Prior to Admission medications   ?Medication Sig Start Date End Date Taking? Authorizing Provider  ?ALPRAZolam (XANAX) 1 MG tablet Take 0.5 tablets (0.5 mg total) by mouth 2 (two) times daily. For anxiety 04/02/21   Elgergawy, Silver Huguenin, MD  ?Ascorbic Acid (VITAMIN C) 100 MG tablet Take 1,000 mg by mouth daily.    [provider]  ?atorvastatin (LIPITOR) 20 MG tablet Take 20 mg by mouth daily. 06/15/21   [provider]  ?Cholecalciferol (VITAMIN D3) 25 MCG (1000 UT) CAPS Take 1,000 Units by mouth daily.    [provider]  ?ELIQUIS 5 MG TABS tablet TAKE 1 TABLET BY MOUTH TWICE A DAY ?Patient taking differently: Take 5 mg by mouth 2 (two) times daily. 12/22/20   Sueanne Margarita, MD  ?fluticasone (FLONASE) 50 MCG/ACT nasal spray Place 2 sprays into both nostrils daily as needed for allergies or rhinitis.    [provider]  ?furosemide (LASIX) 40 MG tablet Take 40 mg by mouth daily. 08/21/21   [provider]  ?hydrochlorothiazide (HYDRODIURIL) 25 MG tablet Take 25 mg by mouth daily. 06/06/21   [provider]  ?HYDROcodone-acetaminophen (NORCO/VICODIN) 5-325 MG tablet Take 1 tablet by mouth every 4 (four) hours as needed for severe pain. ?Patient not taking: Reported on 08/25/2021 04/02/21   Elgergawy, Silver Huguenin, MD  ?hyoscyamine (LEVBID) 0.375 MG 12 hr tablet Take 0.375 mg by mouth daily as needed for cramping.    [provider]  ?Loperamide HCl (IMODIUM PO) Take 4 mg by mouth daily as needed (loose stool).    [provider]  ?metoprolol tartrate (LOPRESSOR) 25 MG tablet Take 25 mg by mouth 2 (two) times daily.  02/01/14   [provider]  ?omeprazole (PRILOSEC OTC) 20 MG tablet Take 20 mg by mouth daily.    [provider]  ?potassium chloride (KLOR-CON) 10 MEQ tablet Take 10 mEq by mouth daily. 08/23/21   [provider]  ?potassium chloride SA (KLOR-CON M20) 20 MEQ tablet  Take 1 tablet (20 mEq total) by mouth daily. Please make overdue appt with Dr. Radford Pax before anymore refills. Thank you 2nd attempt ?Patient taking differently: Take 20 mEq by mouth daily. 07/26/21   Sueanne Margarita, MD  ?QUEtiapine (SEROQUEL) 25 MG tablet Take 25 mg by mouth at bedtime. 07/13/21   [provider]  ?ramipril (ALTACE) 5 MG capsule Take 5 mg by mouth daily. 07/27/21   [provider]  ?tamsulosin (FLOMAX) 0.4 MG CAPS capsule Take 0.4 mg by mouth at bedtime. 06/14/21   [provider]  ?Thiamine HCl (VITAMIN B-1 PO) Take 250 mg by mouth daily.    [provider]  ?tiZANidine (ZANAFLEX) 2 MG tablet Take 2 mg by mouth at bedtime as needed for muscle spasms.    [provider]  ?traZODone (DESYREL) 50 MG tablet Take 50 mg by mouth at bedtime as needed for sleep.    [provider]  ?urea (URE-NA) 15 g PACK oral packet Take 30 g by mouth 2 (two) times daily. 04/02/21   Elgergawy, Silver Huguenin, MD  ?Vitamin E 400 units TABS Take 400 Units by mouth daily.    [provider]  ? ? ?Physical Exam: ?Vitals:  ? 08/25/21 0930 08/25/21 0933  ?BP: 130/82   ?Pulse: (!) 116   ?Temp: (!) 97.5 ?F (36.4 ?C)   ?TempSrc: Oral   ?SpO2: 95%   ?Weight:  99.8 kg  ?Height:  6\' 1"  (1.854 m)  ? ?Constitutional: Elderly male who appears to be lethargic but in no acute distress able to wake in to the voice ?Eyes: PERRL, lids and conjunctivae normal ?ENMT: Mucous membranes are moist  ?Neck: normal, supple, no masses, no thyromegaly ?Respiratory: Normal respiratory effort without significant wheezes or rhonchi appreciated.  O2 saturation currently maintained on 2 L of nasal cannula oxygen. ?Cardiovascular: Irregular irregular.  1+ pitting edema of thelower extremity. 2+ pedal pulses. No carotid bruits.  ?Abdomen: no tenderness, no masses palpated.  Bowel sounds positive.  ?Musculoskeletal: no clubbing / cyanosis. No joint deformity upper and lower extremities. Good ROM, no  contractures.  ?Skin: no rashes, lesions, ulcers. No induration ?Neurologic: CN 2-12 grossly intact.  Strength 5/5 in all 4.  ?Psychiatric: Alert and oriented to self, but not place or time.  Repeats I am okay to any ot

## 2021-08-25 NOTE — Assessment & Plan Note (Addendum)
Acute.  Creatinine noted to be 1.29 with BUN 19.  Mildly increased from baseline which has been around 1-1.1, but not greater than a 0.3 increased from baseline.  Suspect secondary to recent seizure-like activity although no signs of rhabdomyolysis.  ?-Patient gently hydrated with IV fluids.   ?-Outpatient follow-up.   ?

## 2021-08-25 NOTE — ED Notes (Signed)
Patient is alert , able to state his name and recognizes his daughter, confused to DOB  ?

## 2021-08-25 NOTE — Assessment & Plan Note (Addendum)
Patient had reportedly been hypoxic down into the 60s on room air while having seizure-like activity requiring him to be bagged. Currently O2 saturation maintained on 2 L nasal cannula oxygen.  Chest x-ray had previously been noted to show no acute abnormality. ?-Hypoxia improved and had resolved by day of discharge. ?-On day of discharge patient was satting 98% on room air. ?

## 2021-08-25 NOTE — Progress Notes (Signed)
Failed bedside swallow evaluation. Notified MD.   ?

## 2021-08-25 NOTE — ED Triage Notes (Signed)
Patient presents to ed via [facility] states patient was found on the floor by wifes health caregiver , patient was alert oriented when he was found , patient was assisted up to chair and then had a grandmal seizure lasting several minutes, upon ems arrival patient was unresp posticle  confused upon arrival to ed  however per ems he is getting more clear, patient is able is say his name however doesn't remember events to incident.  ?

## 2021-08-25 NOTE — Assessment & Plan Note (Addendum)
-   Patient maintained on home regimen Flomax.   

## 2021-08-25 NOTE — ED Notes (Signed)
Trauma Response Nurse Documentation ? ? ?Eugene [name] is a 86 y.o. male arriving to [facility] ED via EMS ? ?On Xarelto (rivaroxaban) daily. Trauma was activated as a Level 2 by ED Charge RN based on the following trauma criteria Elderly patients > 65 with head trauma on anti-coagulation (excluding ASA). Trauma RN at the bedside on patient arrival. Patient cleared for CT by Dr. Tyrone Nine. Patient to CT with team. GCS 14. ? ?History  ? Past Medical History:  ?Diagnosis Date  ? AAA (abdominal aortic aneurysm) 06/29/2007  ? stent graft  ? Aortic stenosis 09/05/2017  ? Mild AS/AR by echo 01/2017.  ? GERD (gastroesophageal reflux disease)   ? Hemochromatosis   ? Hyperlipidemia   ? Hypertension   ? Irritable bowel syndrome   ? Permanent atrial fibrillation ([place])   ? Stroke [facility])   ? Transient Ischemic Attack  ?  ? Past Surgical History:  ?Procedure Laterality Date  ? ABDOMINAL AORTIC ANEURYSM REPAIR    ? ENDOVASCULAR STENT INSERTION  07/18/2007  ? saccular infrarenal aortic aneurysm  ? EYE SURGERY Bilateral   ? Cataract    ? IR FLUORO GUIDE CV LINE LEFT  12/08/2018  ? IR FLUORO GUIDE CV LINE RIGHT  02/13/2017  ? IR FLUORO GUIDE CV LINE RIGHT  02/25/2017  ? IR US GUIDE VASC ACCESS RIGHT  02/13/2017  ? PICC LINE PLACE PERIPHERAL [facility] HX)  Oct 21, 2014  ?  ? ? ? ?Initial Focused Assessment (If applicable, or please see trauma documentation): ?- GCS 14 ?- Expressive aphasia / post-ictal / word finding difficulty ?- MAE / equal strength in all 4's ?- PERRLA ?- 20G to L FA ?- 18G to R AC ? ?CT's Completed:   ?CT Head and CT C-Spine  ? ?Interventions:  ?- Trauma labs ?- Chest XR ?- Pelvic XR ?- CT head and c-spine ?- zofran given ? ?Plan for disposition:  ?Other Unsure at this time. ? ?Consults completed:  ?none at 1000. - probable neurology consult ? ?Event Summary: ?Pt was BIB [facility] after being found on the floor by his wife's caregiver.  Patient was alert and oriented when initially found and after he was assisted to a chair he had  a witnessed grand-mal seizure lasting several minutes.  When EMS arrived, pt was post-ictal and confused.  Pt does not have any recollection as to what happened.  Upon arrival to ED, pt had difficulty finding words. ? ?MTP Summary (If applicable): n/a ? ?Bedside handoff with ED RN Claiborne Billings.   ? ?Dulcy Fanny W  ?Trauma Response RN ? ?Please call TRN at [phone] for further assistance. ? ? ?

## 2021-08-25 NOTE — Progress Notes (Signed)
Orthopedic Tech Progress Note ?Patient Details:  ?Eugene [name] ?August 25, 1935 ?[id number] ?Level 2 Trauma. Not needed ?Patient ID: Eugene [name], male   DOB: Dec 14, 1935, 86 y.o.   MRN: [id number] ? ?Lateef Juncaj A [name] ?08/25/2021, 9:33 AM ? ?

## 2021-08-25 NOTE — Assessment & Plan Note (Addendum)
Patient presents after having a seizure at home and witnessed having second generalized tonic-clonic seizure by ED staff.  ?-Patient noted to be postictal on presentation and noted at baseline to be alert and oriented x3. ?-Per neurology patient likely with a provoked seizure in the setting of UTI, prior history of CVA. ?-CT head done negative for any acute abnormalities. ?-MRI with no acute infarcts. ?-Patient noted to have been loaded with IV Keppra and then placed on maintenance dose IV Keppra 500 mg twice daily. ?-Stat EEG done in the ED with no epileptiform discharges noted. ?-Patient seen in consultation by neurology who reviewed MRI films, feel patient had a provoked seizure in the setting of UTI and patient with history of prior old strokes.  ?-Neurology recommended continuation of Keppra 500 mg twice daily indefinitely and Eliquis resumed.  ?-Patient subsequently transitioned from IV Keppra to oral Keppra twice daily.   ?-Patient also placed on IV antibiotics for treatment of Klebsiella pneumoniae UTI.   ?-Patient improved clinically had no further seizure-like episodes and will be discharged home in stable and improved condition.  -Outpatient neurology referral made for outpatient follow-up.  ?

## 2021-08-25 NOTE — Assessment & Plan Note (Addendum)
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 ?-Repeat head CT negative for any acute abnormalities. ?-MRI brain negative for any acute infarct. ?-Home regimen Eliquis resumed per neurology 08/26/2021. ?-Patient maintained on statin. ?-Outpatient follow-up. ?

## 2021-08-25 NOTE — Progress Notes (Signed)
?  Echocardiogram ?2D Echocardiogram has been performed. ? ?Delcie Roch ?08/25/2021, 5:56 PM ?

## 2021-08-25 NOTE — Plan of Care (Signed)
?  Problem: Education: Goal: Knowledge of General Education information will improve Description: Including pain rating scale, medication(s)/side effects and non-pharmacologic comfort measures Outcome: Progressing   Problem: Health Behavior/Discharge Planning: Goal: Ability to manage health-related needs will improve Outcome: Progressing   Problem: Clinical Measurements: Goal: Ability to maintain clinical measurements within normal limits will improve Outcome: Progressing Goal: Will remain free from infection Outcome: Progressing Goal: Diagnostic test results will improve Outcome: Progressing Goal: Respiratory complications will improve Outcome: Progressing Goal: Cardiovascular complication will be avoided Outcome: Progressing   Problem: Activity: Goal: Risk for activity intolerance will decrease Outcome: Progressing   Problem: Nutrition: Goal: Adequate nutrition will be maintained Outcome: Progressing   Problem: Coping: Goal: Level of anxiety will decrease Outcome: Progressing   Problem: Elimination: Goal: Will not experience complications related to bowel motility Outcome: Progressing Goal: Will not experience complications related to urinary retention Outcome: Progressing   Problem: Pain Managment: Goal: General experience of comfort will improve Outcome: Progressing   Problem: Safety: Goal: Ability to remain free from injury will improve Outcome: Progressing   Problem: Skin Integrity: Goal: Risk for impaired skin integrity will decrease Outcome: Progressing   Problem: Education: Goal: Knowledge of disease or condition will improve Outcome: Progressing Goal: Knowledge of secondary prevention will improve (SELECT ALL) Outcome: Progressing Goal: Knowledge of patient specific risk factors will improve (INDIVIDUALIZE FOR PATIENT) Outcome: Progressing   Problem: Coping: Goal: Will verbalize positive feelings about self Outcome: Progressing Goal: Will  identify appropriate support needs Outcome: Progressing   Problem: Health Behavior/Discharge Planning: Goal: Ability to manage health-related needs will improve Outcome: Progressing   Problem: Self-Care: Goal: Ability to participate in self-care as condition permits will improve Outcome: Progressing Goal: Verbalization of feelings and concerns over difficulty with self-care will improve Outcome: Progressing Goal: Ability to communicate needs accurately will improve Outcome: Progressing   Problem: Nutrition: Goal: Risk of aspiration will decrease Outcome: Progressing Goal: Dietary intake will improve Outcome: Progressing   Problem: Ischemic Stroke/TIA Tissue Perfusion: Goal: Complications of ischemic stroke/TIA will be minimized Outcome: Progressing   

## 2021-08-25 NOTE — Progress Notes (Signed)
STAT EEG complete - results pending. ? ?

## 2021-08-25 NOTE — ED Notes (Signed)
Daughter was at the bedside and  called out to RN came into room patient having a grandmal seizure lasting approx. 1 minute. Placed on 02 and cardiac pads applied Dr. Adela Lank at bedside. Patient started having several runs for 5-6 beats of V-tachy. Daughter states patient does not have a history of V-tach  ?

## 2021-08-25 NOTE — Progress Notes (Signed)
Responded to page to support patient and staff. Pt. Fall. Pt alert and talking with staff. Provided presence, emotional and spiritual support.  Available as needed. ? ?Fae Pippin, [facility], Pager [phone]  ?

## 2021-08-25 NOTE — ED Provider Notes (Signed)
?[facility] EMERGENCY DEPARTMENT ?Provider Note ? ? ?CSN: [id number] ?Arrival date & time: 08/25/21  6270 ? ?  ? ?History ? ?Chief Complaint  ?Patient presents with  ? Fall  ?  seiz  ? Seizures  ? ? ?Eugene [name] is a 86 y.o. male. ? ?86 yo M with a chief complaints of being found on the floor.  This was noted this morning.  Unsure how long he was on the ground.  Suspected that he probably fell last night.  Patient is demented and unable to provide much history.  Was noted to have a tonic-clonic seizure for a few minutes per caregiver.  Upon EMS arrival the patient was unresponsive and then became confused and then has become more clear now.  Patient denies pain anywhere. ? ? ?Fall ? ?Seizures ? ?  ? ?Home Medications ?Prior to Admission medications   ?Medication Sig Start Date End Date Taking? Authorizing Provider  ?ALPRAZolam (XANAX) 1 MG tablet Take 0.5 tablets (0.5 mg total) by mouth 2 (two) times daily. For anxiety 04/02/21   Elgergawy, Leana Roe, MD  ?Ascorbic Acid (VITAMIN C) 100 MG tablet Take 1,000 mg by mouth daily.    [provider]  ?Cholecalciferol (VITAMIN D3) 25 MCG (1000 UT) CAPS Take 1,000 Units by mouth daily.    [provider]  ?ELIQUIS 5 MG TABS tablet TAKE 1 TABLET BY MOUTH TWICE A DAY ?Patient taking differently: Take 5 mg by mouth 2 (two) times daily. 12/22/20   [name], MD  ?fluticasone (FLONASE) 50 MCG/ACT nasal spray Place 2 sprays into both nostrils daily as needed for allergies or rhinitis.    [provider]  ?HYDROcodone-acetaminophen (NORCO/VICODIN) 5-325 MG tablet Take 1 tablet by mouth every 4 (four) hours as needed for severe pain. 04/02/21   Elgergawy, Leana Roe, MD  ?hyoscyamine (LEVBID) 0.375 MG 12 hr tablet Take 0.375 mg by mouth daily as needed for cramping.    [provider]  ?Loperamide HCl (IMODIUM PO) Take 4 mg by mouth daily as needed (loose stool).    [provider]  ?metoprolol tartrate  (LOPRESSOR) 25 MG tablet Take 25 mg by mouth 2 (two) times daily.  02/01/14   [provider]  ?omeprazole (PRILOSEC OTC) 20 MG tablet Take 20 mg by mouth daily.    [provider]  ?potassium chloride SA (KLOR-CON M20) 20 MEQ tablet Take 1 tablet (20 mEq total) by mouth daily. Please make overdue appt with Dr. Mayford Knife before anymore refills. Thank you 2nd attempt 07/26/21   [name], MD  ?Thiamine HCl (VITAMIN B-1 PO) Take 250 mg by mouth daily.    [provider]  ?tiZANidine (ZANAFLEX) 2 MG tablet Take 2 mg by mouth at bedtime as needed for muscle spasms.    [provider]  ?traZODone (DESYREL) 50 MG tablet Take 50 mg by mouth at bedtime as needed for sleep.    [provider]  ?urea (URE-NA) 15 g PACK oral packet Take 30 g by mouth 2 (two) times daily. 04/02/21   Elgergawy, Leana Roe, MD  ?Vitamin E 400 units TABS Take 400 Units by mouth daily.    [provider]  ?   ? ?Allergies    ?Chlorhexidine gluconate, Macrolides and ketolides, and Tape   ? ?Review of Systems   ?Review of Systems  ?Neurological:  Positive for seizures.  ? ?Physical Exam ?Updated Vital Signs ?BP 130/82 (BP Location: Left Arm)   Pulse Marland Kitchen)  116   Temp (!) 97.5 ?F (36.4 ?C) (Oral)   Ht 6\' 1"  (1.854 m)   Wt 99.8 kg   SpO2 95%   BMI 29.03 kg/m?  ?Physical Exam ?Vitals and nursing note reviewed.  ?Constitutional:   ?   Appearance: He is well-developed.  ?HENT:  ?   Head: Normocephalic.  ?   Comments: Bruising to the right temple.  Bite to the right lateral aspect of the tongue. ?Eyes:  ?   Pupils: Pupils are equal, round, and reactive to light.  ?Neck:  ?   Vascular: No JVD.  ?Cardiovascular:  ?   Rate and Rhythm: Normal rate and regular rhythm.  ?   Heart sounds: No murmur heard. ?  No friction rub. No gallop.  ?Pulmonary:  ?   Effort: No respiratory distress.  ?   Breath sounds: No wheezing.  ?Abdominal:  ?   General: There is no distension.  ?   Tenderness: There is no abdominal  tenderness. There is no guarding or rebound.  ?Musculoskeletal:     ?   General: Normal range of motion.  ?   Cervical back: Normal range of motion and neck supple.  ?   Comments: Palpated from head to toe without any noted areas of bony tenderness.  No midline spinal tenderness step-offs or deformities.  ?Skin: ?   Coloration: Skin is not pale.  ?   Findings: No rash.  ?Neurological:  ?   Mental Status: He is alert and oriented to person, place, and time.  ?   GCS: GCS eye subscore is 4. GCS verbal subscore is 5. GCS motor subscore is 6.  ?   Cranial Nerves: Cranial nerves 2-12 are intact.  ?   Sensory: Sensation is intact.  ?   Motor: Motor function is intact.  ?   Coordination: Coordination is intact.  ?   Comments: No focal finding on neuro exam.  Somewhat limited due to patient cooperation  ?Psychiatric:     ?   Behavior: Behavior normal.  ? ? ?ED Results / Procedures / Treatments   ?Labs ?(all labs ordered are listed, but only abnormal results are displayed) ?Labs Reviewed  ?CBC WITH DIFFERENTIAL/PLATELET - Abnormal; Notable for the following components:  ?    Result Value  ? RBC 3.89 (*)   ? Hemoglobin 12.7 (*)   ? HCT 37.8 (*)   ? Neutro Abs 8.6 (*)   ? Lymphs Abs 0.5 (*)   ? All other components within normal limits  ?COMPREHENSIVE METABOLIC PANEL - Abnormal; Notable for the following components:  ? Sodium 132 (*)   ? CO2 21 (*)   ? Glucose, Bld 126 (*)   ? Creatinine, Ser 1.29 (*)   ? Total Protein 6.2 (*)   ? Total Bilirubin 1.3 (*)   ? GFR, Estimated 55 (*)   ? All other components within normal limits  ?CBG MONITORING, ED - Abnormal; Notable for the following components:  ? Glucose-Capillary 139 (*)   ? All other components within normal limits  ?ETHANOL  ?MAGNESIUM  ?CK  ? ? ?EKG ?EKG Interpretation ? ?Date/Time:  Friday August 25 2021 09:20:14 EDT ?Ventricular Rate:  108 ?PR Interval:    ?QRS Duration: 107 ?QT Interval:  355 ?QTC Calculation: 476 ?R Axis:   -33 ?Text Interpretation: Atrial fibrillation  Incomplete RBBB and LAFB Borderline low voltage, extremity leads Abnormal R-wave progression, early transition Borderline prolonged QT interval No significant change since last tracing Confirmed by  Melene PlanFloyd, Manasi Dishon [phone]([id number]) on 08/25/2021 10:24:40 AM ? ?Radiology ?CT Head Wo Contrast ? ?Result Date: 08/25/2021 ?CLINICAL DATA:  Found on the floor, seizure EXAM: CT HEAD WITHOUT CONTRAST TECHNIQUE: Contiguous axial images were obtained from the base of the skull through the vertex without intravenous contrast. RADIATION DOSE REDUCTION: This exam was performed according to the departmental dose-optimization program which includes automated exposure control, adjustment of the mA and/or kV according to patient size and/or use of iterative reconstruction technique. COMPARISON:  CT head 03/24/2021 FINDINGS: Brain: There is no evidence of acute intracranial hemorrhage, extra-axial fluid collection, or acute infarct. There is mild global parenchymal volume loss with prominence of the ventricular system and extra-axial CSF spaces. The ventricles are stable in size and configuration compared to the prior study. Gray-white differentiation is preserved. Patchy hypodensity in the subcortical and periventricular white matter likely reflects sequela of chronic white matter microangiopathy. There is a small remote lacunar infarct in the right basal ganglia. There is no solid mass lesion. There is no mass effect or midline shift. Vascular: There is calcification of the bilateral cavernous ICAs. Skull: Normal. Negative for fracture or focal lesion. Sinuses/Orbits: Imaged paranasal sinuses are clear. Bilateral lens implants are in place. The globes and orbits are otherwise unremarkable. Other: None. IMPRESSION: No acute intracranial pathology. Electronically Signed   By: Lesia HausenPeter  Noone M.D.   On: 08/25/2021 10:06  ? ?CT Cervical Spine Wo Contrast ? ?Result Date: 08/25/2021 ?CLINICAL DATA:  Found on the floor, seizure EXAM: CT CERVICAL SPINE WITHOUT  CONTRAST TECHNIQUE: Multidetector CT imaging of the cervical spine was performed without intravenous contrast. Multiplanar CT image reconstructions were also generated. RADIATION DOSE REDUCTION: This exam was performed

## 2021-08-25 NOTE — Assessment & Plan Note (Addendum)
-  Blood pressure noted to be soft the morning of 08/26/2021.  ?-BP improved ?-Patient's home regimen of furosemide and ramipril were held throughout the hospitalization.   ?-Patient maintained on metoprolol secondary to A-fib.   ?-Patient hydrated gently with improvement with soft blood pressure.   ?-Patient was discharged home in stable and improved condition and will resume patient's ramipril 1 week postdischarge and furosemide 3 to 4 days post discharge.   ?-Outpatient follow-up with PCP and cardiology.  ?

## 2021-08-25 NOTE — Consult Note (Addendum)
Neurology Attending Attestation ?  ?I examined the patient and discussed plan with Ms. Ronnald Ramp NP. I am in agreement with note below with the following additions/exceptions: 86 yo man with prior hx stroke with no residual deficits per family p/w first time seizure today x2. STAT EEG in ED showed no ongoing seizure activity. He was returning to baseline on our evaluation, drowsy and mildly confused (baseline per daughter) but nonfocal. NIHSS = 2. CT head NAICP. Patient loaded on keppra and continued on 500mg  bid. Patient is on anticoagulation for a fib, will hold until stroke ruled out by MRI brain. ? ?Plan ?- Permissive HTN x48 hrs from sx onset or until stroke ruled out by MRI goal BP <220/110. PRN labetalol or hydralazine if BP above these parameters. Avoid oral antihypertensives. Ramipril and metoprolol held. ?- MRI brain wwo contrast ?- Further stroke workup if MRI brain shows acute infarct ?- Hold eliquis until MRI brain rules out acute infarct ?- q4 hr neuro checks ?- STAT head CT for any change in neuro exam ?- Tele ?- PT/OT/SLP ?- NPO until swallow screen ? ?Will continue to follow. ? ?  ?Su Monks, MD ?[facility] ?[phone] ?  ?If 7pm- 7am, please page neurology on call as listed in [place]. ? ? ?Neurology Consult H&P ? ?Eugene [name] ?MR# [id number] ?08/25/2021 ? ? ?CC: slipped off recliner today and fell, moments later home CNA witnessed mouth twitching seizure and had family call EMS. After arriving at ED RN witnessed what was described as a grand mal seizure lasting approximately 1 minute and then V-tach noted on telemetry. ? ?History is obtained from: Family and chart. ? ?HPI: Eugene [name] is a 86 y.o. male PMHx as reviewed below to include previous stroke and TIAs per his family. Per imaging in chart, his 07/2002 MRI showed patient has old stroke in left cerebellum that was not present in 2001.  ? ? ?Modified Rankin Scale: 4-Needs assistance to walk and tend to bodily  needs ?NIH Stroke Scale (performed while pt sedated-11)  ? ?Latest NIHSS @ 1220pm ?1a. Level of Consciousness 1-Not alert but arousable ?1b. Level of Consciousness Questions 1-Answers only one question correctly ?1c. Level of Consciousness Commands 0-Performs both tasks correctly ?2. Best Gaze 0-Normal ?3. Visual 0-No visual loss ?4. Facial Palsy 0-Normal ?5a. Motor Arm Left 0-No drift ?5b. Motor Arm Right 0-No drift ?6a. Motor Leg Left 0-No drift ?6b. Motor Leg Right 0-No drift ?7. Ataxia 0-Absent ?8. Sensory 0-Normal ?9. Best Language 0-No aphasia ?10. Dysarthria 0-Normal  ?11. Extinction and Inattention 0-No abnormality ? ?NIHSS Total: 2 ?ROS: Unable to fully assess due to sedation. ? ?Past Medical History:  ?Diagnosis Date  ? AAA (abdominal aortic aneurysm) 06/29/2007  ? stent graft  ? Aortic stenosis 09/05/2017  ? Mild AS/AR by echo 01/2017.  ? GERD (gastroesophageal reflux disease)   ? Hemochromatosis   ? Hyperlipidemia   ? Hypertension   ? Irritable bowel syndrome   ? Permanent atrial fibrillation ([place])   ? Stroke [facility])   ? Transient Ischemic Attack  ? ? ? ?Family History  ?Problem Relation Age of Onset  ? Hypertension Mother   ? Heart disease Mother   ?     AAA, he passed in his 71 s of Heart Diseasse  ? Heart disease Father   ?     AAA history and he passed at age 6  of   Disease  ? ? ?Social History:  reports that he quit smoking about  22 years ago. His smoking use included cigarettes. He has never used smokeless tobacco. He reports current alcohol use of about 1.0 - 2.0 standard drink per week. He reports that he does not use drugs. ? ? ?Prior to Admission medications   ?Medication Sig Start Date End Date Taking? Authorizing Provider  ?ALPRAZolam (XANAX) 1 MG tablet Take 0.5 tablets (0.5 mg total) by mouth 2 (two) times daily. For anxiety 04/02/21   Elgergawy, Silver Huguenin, MD  ?Ascorbic Acid (VITAMIN C) 100 MG tablet Take 1,000 mg by mouth daily.    [provider]  ?Cholecalciferol (VITAMIN D3) 25 MCG  (1000 UT) CAPS Take 1,000 Units by mouth daily.    [provider]  ?ELIQUIS 5 MG TABS tablet TAKE 1 TABLET BY MOUTH TWICE A DAY ?Patient taking differently: Take 5 mg by mouth 2 (two) times daily. 12/22/20   Sueanne Margarita, MD  ?fluticasone (FLONASE) 50 MCG/ACT nasal spray Place 2 sprays into both nostrils daily as needed for allergies or rhinitis.    [provider]  ?HYDROcodone-acetaminophen (NORCO/VICODIN) 5-325 MG tablet Take 1 tablet by mouth every 4 (four) hours as needed for severe pain. 04/02/21   Elgergawy, Silver Huguenin, MD  ?hyoscyamine (LEVBID) 0.375 MG 12 hr tablet Take 0.375 mg by mouth daily as needed for cramping.    [provider]  ?Loperamide HCl (IMODIUM PO) Take 4 mg by mouth daily as needed (loose stool).    [provider]  ?metoprolol tartrate (LOPRESSOR) 25 MG tablet Take 25 mg by mouth 2 (two) times daily.  02/01/14   [provider]  ?omeprazole (PRILOSEC OTC) 20 MG tablet Take 20 mg by mouth daily.    [provider]  ?potassium chloride SA (KLOR-CON M20) 20 MEQ tablet Take 1 tablet (20 mEq total) by mouth daily. Please make overdue appt with Dr. Radford Pax before anymore refills. Thank you 2nd attempt 07/26/21   Sueanne Margarita, MD  ?Thiamine HCl (VITAMIN B-1 PO) Take 250 mg by mouth daily.    [provider]  ?tiZANidine (ZANAFLEX) 2 MG tablet Take 2 mg by mouth at bedtime as needed for muscle spasms.    [provider]  ?traZODone (DESYREL) 50 MG tablet Take 50 mg by mouth at bedtime as needed for sleep.    [provider]  ?urea (URE-NA) 15 g PACK oral packet Take 30 g by mouth 2 (two) times daily. 04/02/21   Elgergawy, Silver Huguenin, MD  ?Vitamin E 400 units TABS Take 400 Units by mouth daily.    [provider]  ? ? ?Exam: ?Current vital signs: ?BP 130/82 (BP Location: Left Arm)   Pulse (!) 116   Temp (!) 97.5 ?F (36.4 ?C) (Oral)   Ht 6\' 1"  (1.854 m)   Wt 99.8 kg   SpO2 95%   BMI 29.03 kg/m?  ? ?Physical  Exam  ?Constitutional: Appears well-developed and well-nourished.  ?Psych: sedated ?Eyes: No scleral injection ?HENT: No OP obstruction. ?Head: Normocephalic.  ?Cardiovascular: Afib on portable heart monitor  ?Respiratory: Effort normal, symmetric excursions bilaterally, no audible wheezing. ?GI: Soft.  No distension. There is no tenderness.  ?Skin: WDI ? ? ?Mental Status: ?Patient is sedated ?Patient is unable to give a clear and coherent history.Family at bedside gave hx ?Speech dysarthria? Garbled speech, moaning rather than talking likely due to sedation  ?No signs of aphasia or neglect. ?Visual Fields are full. Pupils are 46mm, round, and reactive to light. ?EOMI without ptosis or diploplia.  ?  Facial movement is symmetric.  ?Hearing is intact to voice. ?Uvula midline and palate elevates symmetrically. ?Tongue is midline without atrophy or fasciculations.  ?Tone is normal. Bulk is normal. 5/5 strength was present in all four extremities. ?Toes are downgoing bilaterally. ?Gait - Deferred in emergency. Family reports pt walks with walker since fracturing hip ? ?Re-assessed patient at 1220 pm and he is now awake and alert and oriented although still drowsy.  ? ?I have reviewed labs in epic and the pertinent results are: ?Mag- 1.9, Potassium- 4.3, Ethanol- <10, WBC 9.7, NA-132 ? ?I have reviewed the images obtained: ?NCT head showed -No acute intracranial pathology. ?CT C- spine showed No acute fracture or traumatic malalignment of the cervical ?spine.Multilevel degenerative changes and Emphysema.  ?Stat EEG- was normal, no epileptiform activity captured ? ?Assessment: Eugene [name] is a 86 y.o. male PMHx occlusion and stenosis of carotid artery without mention of cerebral infarction 2013, Abdominal aneurysm without mention of rupture, permanent atrial fibrillation, GERD, Hyponatremia and hypomagnesemia.  ? ?Recommended Keppra 1000mg  IV Once now ? ?Impression:  ?First time seizure vs stroke with continued neuro  symptoms.  ? ?Plan: ?- Stat EEG- normal, no epileptiform activity captured ?- MRI brain without contrast when patient stable ?- NIHSS with neuro checks ?- Continue Xanax 0.5mg  BID from home ( daughter clarifies altho

## 2021-08-25 NOTE — Assessment & Plan Note (Addendum)
Home medication regimen includes Seroquel 25 mg nightly and Xanax 0.5 mg which were continued during the hospitalization. ?

## 2021-08-25 NOTE — Procedures (Signed)
Patient Name: Eugene [name]  ?MRN: [id number]  ?Epilepsy Attending: Charlsie Quest  ?Referring Physician/Provider: Jefferson Fuel, MD ?Date: 08/25/2021 ?Duration: 22.24 mins ? ?Patient history: 86 year old male with seizure-like activity and altered mental status.  EEG done for seizure. ? ?Level of alertness: Lethargic ? ?AEDs during EEG study: None ? ?Technical aspects: This EEG study was done with scalp electrodes positioned according to the 10-20 International system of electrode placement. Electrical activity was acquired at a sampling rate of 500Hz  and reviewed with a high frequency filter of 70Hz  and a low frequency filter of 1Hz . EEG data were recorded continuously and digitally stored.  ? ?Description: EEG showed continuous generalized 3 to 5 Hz theta-delta slowing. Hyperventilation and photic stimulation were not performed.    ? ?ABNORMALITY ?- Continuous slow, generalized ? ?IMPRESSION: ?This study is suggestive of moderate to severe diffuse encephalopathy, nonspecific etiology. No seizures or epileptiform discharges were seen throughout the recording. ? ? ?[name]  ? ?

## 2021-08-25 NOTE — ED Notes (Signed)
Transported to CT with TRN.  

## 2021-08-25 NOTE — Assessment & Plan Note (Addendum)
-   Likely secondary to acute seizures on presentation.   ?-Patient is reportedly alert and oriented x3, but alert and oriented only to self at time of admission. ?-.  No focal weakness appreciated on physical exam.  ?-Likely secondary to patient being postictal state in the setting of UTI.   ?-Chest x-ray did not note any acute abnormality. ?-Urinalysis concerning for UTI. ?-Urine cultures > 100,000 Klebsiella pneumoniae.  ?-MRI negative for any acute abnormalities ?-Patient placed on antiepileptic medication during the hospitalization as well as IV antibiotics. ?-Patient followed by neurology. ?-Patient improved clinically was transitioned to oral Keppra which she will be discharged home on indefinitely. ?-Patient also be discharged home on oral Vantin for 4 more days to complete a 7-day course of antibiotic treatment. ?-Outpatient follow-up with PCP. ? ?

## 2021-08-25 NOTE — Assessment & Plan Note (Addendum)
While in the ED patient was noted to have couple episodes of non-SVT.  Chest x-ray last echocardiogram revealed EF of 50-55% with diastolic function unable to be visualized. ?-BNP noted at 334 patient with no significant shortness of breath or asymptomatic. ?-2D echo with EF of 50 to 55%,NWMA, mild LVH.  Mildly reduced right ventricular systolic function, severely elevated pulmonary arterial systolic pressure.  Moderately dilated left atrial size, moderately dilated right atrial size. ?-Patient's electrolytes repleted. ?-Patient maintained on home regimen metoprolol. ?-Outpatient follow-up with primary cardiologist. ?

## 2021-08-25 NOTE — Assessment & Plan Note (Addendum)
Patient was in atrial fibrillation with heart rates in the 130s while PT was working with patient (08/26/2021).   ?-CT head negative for any bleed.   ?-MRI brain negative for any acute abnormalities ?-Cardizem 10 mg IV push ordered(08/26/2021) as well as resumption of patient's metoprolol however per RN an hour later heart rate noted to be sustaining < 100. ?-Discontinued Cardizem push.   ?-Heart rate elevated this morning in the 140s with patient transferring from bed to chair however patient remained asymptomatic. ?-Patient given home regimen of metoprolol earlier than scheduled with improvement with heart rates.   ?-Patient hydrated with IV fluids.   ?-MRI brain negative for any acute abnormalities and as such Eliquis resumed 08/26/2021 per neurology. ?-Outpatient follow-up with primary cardiologist. ?

## 2021-08-25 NOTE — Assessment & Plan Note (Signed)
History of AAA status post endograft stenting. ?

## 2021-08-26 ENCOUNTER — Observation Stay (HOSPITAL_COMMUNITY): Payer: Medicare Other

## 2021-08-26 DIAGNOSIS — Z8673 Personal history of transient ischemic attack (TIA), and cerebral infarction without residual deficits: Secondary | ICD-10-CM | POA: Diagnosis not present

## 2021-08-26 DIAGNOSIS — R569 Unspecified convulsions: Secondary | ICD-10-CM | POA: Diagnosis present

## 2021-08-26 DIAGNOSIS — I472 Ventricular tachycardia, unspecified: Secondary | ICD-10-CM

## 2021-08-26 DIAGNOSIS — I10 Essential (primary) hypertension: Secondary | ICD-10-CM | POA: Diagnosis present

## 2021-08-26 DIAGNOSIS — Z79899 Other long term (current) drug therapy: Secondary | ICD-10-CM | POA: Diagnosis not present

## 2021-08-26 DIAGNOSIS — I350 Nonrheumatic aortic (valve) stenosis: Secondary | ICD-10-CM | POA: Diagnosis present

## 2021-08-26 DIAGNOSIS — Z881 Allergy status to other antibiotic agents status: Secondary | ICD-10-CM | POA: Diagnosis not present

## 2021-08-26 DIAGNOSIS — N390 Urinary tract infection, site not specified: Secondary | ICD-10-CM | POA: Diagnosis present

## 2021-08-26 DIAGNOSIS — Z8249 Family history of ischemic heart disease and other diseases of the circulatory system: Secondary | ICD-10-CM | POA: Diagnosis not present

## 2021-08-26 DIAGNOSIS — G9340 Encephalopathy, unspecified: Secondary | ICD-10-CM | POA: Diagnosis not present

## 2021-08-26 DIAGNOSIS — I714 Abdominal aortic aneurysm, without rupture, unspecified: Secondary | ICD-10-CM | POA: Diagnosis not present

## 2021-08-26 DIAGNOSIS — N289 Disorder of kidney and ureter, unspecified: Secondary | ICD-10-CM | POA: Diagnosis present

## 2021-08-26 DIAGNOSIS — R1310 Dysphagia, unspecified: Secondary | ICD-10-CM | POA: Diagnosis present

## 2021-08-26 DIAGNOSIS — Z888 Allergy status to other drugs, medicaments and biological substances status: Secondary | ICD-10-CM | POA: Diagnosis not present

## 2021-08-26 DIAGNOSIS — Z7901 Long term (current) use of anticoagulants: Secondary | ICD-10-CM | POA: Diagnosis not present

## 2021-08-26 DIAGNOSIS — M25511 Pain in right shoulder: Secondary | ICD-10-CM | POA: Diagnosis not present

## 2021-08-26 DIAGNOSIS — E785 Hyperlipidemia, unspecified: Secondary | ICD-10-CM | POA: Diagnosis present

## 2021-08-26 DIAGNOSIS — F0394 Unspecified dementia, unspecified severity, with anxiety: Secondary | ICD-10-CM | POA: Diagnosis present

## 2021-08-26 DIAGNOSIS — R0902 Hypoxemia: Secondary | ICD-10-CM | POA: Diagnosis present

## 2021-08-26 DIAGNOSIS — I4821 Permanent atrial fibrillation: Secondary | ICD-10-CM | POA: Diagnosis present

## 2021-08-26 DIAGNOSIS — F419 Anxiety disorder, unspecified: Secondary | ICD-10-CM | POA: Diagnosis not present

## 2021-08-26 DIAGNOSIS — K219 Gastro-esophageal reflux disease without esophagitis: Secondary | ICD-10-CM | POA: Diagnosis present

## 2021-08-26 DIAGNOSIS — Z8679 Personal history of other diseases of the circulatory system: Secondary | ICD-10-CM | POA: Diagnosis not present

## 2021-08-26 DIAGNOSIS — Z87891 Personal history of nicotine dependence: Secondary | ICD-10-CM | POA: Diagnosis not present

## 2021-08-26 DIAGNOSIS — G40409 Other generalized epilepsy and epileptic syndromes, not intractable, without status epilepticus: Secondary | ICD-10-CM | POA: Diagnosis present

## 2021-08-26 DIAGNOSIS — B9620 Unspecified Escherichia coli [E. coli] as the cause of diseases classified elsewhere: Secondary | ICD-10-CM | POA: Diagnosis present

## 2021-08-26 DIAGNOSIS — N400 Enlarged prostate without lower urinary tract symptoms: Secondary | ICD-10-CM | POA: Diagnosis present

## 2021-08-26 DIAGNOSIS — Z66 Do not resuscitate: Secondary | ICD-10-CM | POA: Diagnosis present

## 2021-08-26 LAB — URINALYSIS, COMPLETE (UACMP) WITH MICROSCOPIC
Bilirubin Urine: NEGATIVE
Glucose, UA: NEGATIVE mg/dL
Ketones, ur: 20 mg/dL — AB
Nitrite: NEGATIVE
Protein, ur: NEGATIVE mg/dL
Specific Gravity, Urine: 1.011 (ref 1.005–1.030)
WBC, UA: >50 WBC/hpf — ABNORMAL HIGH (ref 0–5)
pH: 6 (ref 5.0–8.0)

## 2021-08-26 LAB — BASIC METABOLIC PANEL
Anion gap: 8 (ref 5–15)
BUN: 16 mg/dL (ref 8–23)
CO2: 23 mmol/L (ref 22–32)
Calcium: 9 mg/dL (ref 8.9–10.3)
Chloride: 104 mmol/L (ref 98–111)
Creatinine, Ser: 1.27 mg/dL — ABNORMAL HIGH (ref 0.61–1.24)
GFR, Estimated: 56 mL/min — ABNORMAL LOW (ref >60)
Glucose, Bld: 94 mg/dL (ref 70–99)
Potassium: 3.9 mmol/L (ref 3.5–5.1)
Sodium: 135 mmol/L (ref 135–145)

## 2021-08-26 LAB — CBC
HCT: 34.4 % — ABNORMAL LOW (ref 39.0–52.0)
Hemoglobin: 11.7 g/dL — ABNORMAL LOW (ref 13.0–17.0)
MCH: 32.3 pg (ref 26.0–34.0)
MCHC: 34 g/dL (ref 30.0–36.0)
MCV: 95 fL (ref 80.0–100.0)
Platelets: 168 10*3/uL (ref 150–400)
RBC: 3.62 MIL/uL — ABNORMAL LOW (ref 4.22–5.81)
RDW: 13.7 % (ref 11.5–15.5)
WBC: 9.2 10*3/uL (ref 4.0–10.5)
nRBC: 0 % (ref 0.0–0.2)

## 2021-08-26 MED ORDER — SALINE SPRAY 0.65 % NA SOLN
1.0000 | NASAL | Status: DC | PRN
  Filled 2021-08-26: qty 44

## 2021-08-26 MED ORDER — GADOBUTROL 1 MMOL/ML IV SOLN
9.9000 mL | Freq: Once | INTRAVENOUS | Status: AC | PRN
  Administered 2021-08-26: 9.9 mL via INTRAVENOUS

## 2021-08-26 MED ORDER — ALPRAZOLAM 0.5 MG PO TABS
0.5000 mg | ORAL_TABLET | Freq: Two times a day (BID) | ORAL | Status: DC
Start: 2021-08-26 — End: 2021-08-29
  Administered 2021-08-26 – 2021-08-29 (×6): 0.5 mg via ORAL
  Filled 2021-08-26 (×6): qty 1

## 2021-08-26 MED ORDER — SODIUM CHLORIDE 0.9 % IV BOLUS
500.0000 mL | Freq: Once | INTRAVENOUS | Status: AC
  Administered 2021-08-26: 500 mL via INTRAVENOUS

## 2021-08-26 MED ORDER — SODIUM CHLORIDE 0.9 % IV SOLN
2.0000 g | INTRAVENOUS | Status: AC
  Administered 2021-08-27 – 2021-08-28 (×2): 2 g via INTRAVENOUS
  Filled 2021-08-26 (×2): qty 20

## 2021-08-26 MED ORDER — METOPROLOL TARTRATE 25 MG PO TABS
25.0000 mg | ORAL_TABLET | Freq: Two times a day (BID) | ORAL | Status: DC
  Administered 2021-08-26 – 2021-08-29 (×7): 25 mg via ORAL
  Filled 2021-08-26 (×7): qty 1

## 2021-08-26 MED ORDER — APIXABAN 5 MG PO TABS
5.0000 mg | ORAL_TABLET | Freq: Two times a day (BID) | ORAL | Status: DC
  Administered 2021-08-26 – 2021-08-29 (×6): 5 mg via ORAL
  Filled 2021-08-26 (×6): qty 1

## 2021-08-26 MED ORDER — DILTIAZEM LOAD VIA INFUSION
10.0000 mg | Freq: Once | INTRAVENOUS | Status: DC
  Filled 2021-08-26: qty 10

## 2021-08-26 MED ORDER — SODIUM CHLORIDE 0.9 % IV SOLN
1.0000 g | INTRAVENOUS | Status: DC
  Administered 2021-08-26: 1 g via INTRAVENOUS
  Filled 2021-08-26: qty 10

## 2021-08-26 MED ORDER — SODIUM CHLORIDE 0.9 % IV SOLN: INTRAVENOUS | Status: DC

## 2021-08-26 NOTE — Progress Notes (Signed)
CROSS COVER NOTE ? ? ?Eugene [name] ?[id number] ? ? ? ? ?S: ? ?Nurse called with low bp previously (bolus given). ? ?Also noted nurse attempted swallow screen again when patient was more alert and he passed. ? ?Notified by nurse that pt had an abnormal UA. ? ? ?O: ? ?Blood pressure (!) 95/50, pulse 64, temperature 98 ?F (36.7 ?C), temperature source Oral, resp. rate 20, height 6\' 1"  (1.854 m), weight 99.8 kg, SpO2 100 %. ? ?Data: UA reviewed. CBC reviewed. ? ? ?A/P: ? ?Mild hypotension, SBP < 90. ?Plan: Gave NS bolus 500 mL x 1. ? ?Dysphagia. ?More awake and alert so nurse rechecked swallow screen and patient passed. ?Gave order to advance diet. ? ?Abnormal UA. ?Could have UTI, which could be contributing to his encephalopathy. ?Plan: ?Ordered urine culture. ?We will start IV ceftriaxone empirically, but may be discontinued if patient does not appear to have an infection or if culture is negative. ?

## 2021-08-26 NOTE — Progress Notes (Signed)
Neurology Progress Note ?Eugene [name] ?MR# [id number] ?08/26/2021 ? ? ?S: denies overnight events; no new complaints. ? ? ?O: ?Current vital signs: ?BP 102/67   Pulse 76   Temp 98 ?F (36.7 ?C) (Oral)   Resp 19   Ht 6\' 1"  (1.854 m)   Wt 99.8 kg   SpO2 92%   BMI 29.03 kg/m?  ?Vital signs in last 24 hours: ?Temp:  [98 ?F (36.7 ?C)-99.6 ?F (37.6 ?C)] 98 ?F (36.7 ?C) (04/01 1300) ?Pulse Rate:  [64-126] 76 (04/01 1520) ?Resp:  [16-22] 19 (04/01 1300) ?BP: (89-121)/(50-78) 102/67 (04/01 1520) ?SpO2:  [88 %-100 %] 92 % (04/01 1520) ?GENERAL: Awake, alert in NAD ?HEENT: Normocephalic and atraumatic, moist mm, no LN++, no thyromegaly ?LUNGS: symmetric excursions bilaterally with no audible wheezes. ?CV: RR, equal pulses bilaterally. ?ABDOMEN: Soft, nontender, nondistended with normoactive BS ?Ext: warm, well perfused, intact peripheral pulses  ?NEURO:  ?Mental Status: AA&Ox3  ?Language: speech is fluent.  Intact naming, repetition, and comprehension. ?PERR. EOMI, visual fields full, no facial asymmetry, facial sensation intact, hearing intact. ?No evidence of tongue atrophy or fibrillations, tongue/uvula/soft palate midline elevates symmetrically  ?Normal sternocleidomastoid and trapezius muscle strength. Motor: 5/5 strength in all extremities. ?Tone: Tone and bulk is normal ?Sensation: Intact to light touch bilaterally ?Coordination: FTN intact bilaterally, no ataxia in BLE. ?Gait - Deferred ? ?NIHSS 0 ? ?Labs ? ?   ?Component Value Date/Time  ? WBC 9.2 08/26/2021 0210  ? RBC 3.62 (L) 08/26/2021 0210  ? HGB 11.7 (L) 08/26/2021 0210  ? HGB 14.6 04/28/2019 1338  ? HGB 12.1 (L) 04/09/2017 1308  ? HCT 34.4 (L) 08/26/2021 0210  ? HCT 36.1 (L) 04/09/2017 1308  ? PLT 168 08/26/2021 0210  ? PLT 220 04/28/2019 1338  ? PLT 241 04/09/2017 1308  ? MCV 95.0 08/26/2021 0210  ? MCV 98.3 (H) 04/09/2017 1308  ? MCH 32.3 08/26/2021 0210  ? MCHC 34.0 08/26/2021 0210  ? RDW 13.7 08/26/2021 0210  ? RDW 12.8 04/09/2017 1308  ? LYMPHSABS  0.5 (L) 08/25/2021 0920  ? LYMPHSABS 1.1 04/09/2017 1308  ? MONOABS 0.4 08/25/2021 0920  ? MONOABS 0.6 04/09/2017 1308  ? EOSABS 0.0 08/25/2021 0920  ? EOSABS 0.2 04/09/2017 1308  ? BASOSABS 0.0 08/25/2021 0920  ? BASOSABS 0.0 04/09/2017 1308  ? ? ?   ?Component Value Date/Time  ? NA 135 08/26/2021 0210  ? NA 136 09/05/2017 1545  ? NA 135 (L) 01/30/2017 1145  ? K 3.9 08/26/2021 0210  ? K 3.5 01/30/2017 1145  ? CL 104 08/26/2021 0210  ? CO2 23 08/26/2021 0210  ? CO2 27 01/30/2017 1145  ? GLUCOSE 94 08/26/2021 0210  ? GLUCOSE 87 01/30/2017 1145  ? BUN 16 08/26/2021 0210  ? BUN 13 09/05/2017 1545  ? BUN 11.5 01/30/2017 1145  ? CREATININE 1.27 (H) 08/26/2021 0210  ? CREATININE 1.1 01/30/2017 1145  ? CALCIUM 9.0 08/26/2021 0210  ? CALCIUM 10.4 01/30/2017 1145  ? PROT 6.2 (L) 08/25/2021 0920  ? PROT 6.9 01/30/2017 1145  ? ALBUMIN 3.7 08/25/2021 0920  ? ALBUMIN 4.1 01/30/2017 1145  ? AST 23 08/25/2021 0920  ? AST 22 01/30/2017 1145  ? ALT 15 08/25/2021 0920  ? ALT 22 01/30/2017 1145  ? ALKPHOS 65 08/25/2021 0920  ? ALKPHOS 57 01/30/2017 1145  ? BILITOT 1.3 (H) 08/25/2021 0920  ? BILITOT 1.59 (H) 01/30/2017 1145  ? GFRNONAA 56 (L) 08/26/2021 0210  ? GFRAA >60 02/11/2020 1328  ? ? ?  No results found for: CHOL, TRIG, HDL, CHOLHDL, VLDL, LDLCALC, LDLDIRECT ? ?Medications: ?Scheduled Meds: ? atorvastatin  20 mg Oral QPM  ? metoprolol tartrate  25 mg Oral BID  ? QUEtiapine  25 mg Oral QHS  ? tamsulosin  0.4 mg Oral QHS  ? ?Continuous Infusions: ? sodium chloride 100 mL/hr at 08/26/21 1400  ? [START ON 08/27/2021] cefTRIAXone (ROCEPHIN)  IV    ? levETIRAcetam Stopped (08/26/21 1131)  ? ?PRN Meds:.acetaminophen **OR** acetaminophen, albuterol, diazepam, sodium chloride ? ?Imaging ?I have reviewed images in epic and the results pertinent to this consultation are: ?CT Head showed No acute intracranial pathology. ?CT Cervical Spine showed No acute fracture or traumatic malalignment of the cervical ?Spine. Multilevel degenerative changes  and Emphysema.  ?Stat EEG was normal, no epileptiform activity captured ? ?Assessment: [name] is a 86 y.o. male PMHx  occlusion and stenosis of carotid artery without mention of cerebral infarction 2013, Abdominal aneurysm without mention of rupture, permanent atrial fibrillation, GERD, Hyponatremia, hypomagnesemia and prior hx stroke with no residual deficits per family p/w first time seizure yesterday x2.  ? ?08/25/21: STAT EEG in ED showed no ongoing seizure activity. He was returning to baseline on our evaluation, drowsy and mildly confused (baseline per daughter) but nonfocal. NIHSS = 2. CT head NAICP. Patient loaded on keppra and continued on 500mg  bid. Patient is on anticoagulation for a fib, will hold until stroke ruled out by MRI brain. ? ?08/26/21: Patient seen in bed with wife and daughter at bedside. Patient is alert and oriented x3. No further seizure activity noted by family at bedside.  ? ?Recommendations: ?- MRI Brain still pending at 1700.  ?- If no stroke or bleeding on MRI will recommend to restart Eliquis ?- Further stroke workup if MRI brain shows acute infarct ?- Continue Keppra 500mg  Q12hr and transition to PO when appropriate ?- q4 hr neuro checks ?- Permissive HTN x48 hrs from sx onset or until stroke ruled out by MRI goal BP <220/110. PRN labetalol or hydralazine if BP above these parameters. Avoid oral antihypertensives. Ramipril and metoprolol held. ?- STAT head CT for any change in neuro exam ?- Tele ?- PT/OT/SLP ? ? ?Electronically signed by:  ?Parke Poisson, Neurology NP ?Can be reached on Epic Secure Messenger ?08/26/2021, 3:53 PM ? ?If 7pm- 7am, please page neurology on call as listed in [facility].  ?

## 2021-08-26 NOTE — Assessment & Plan Note (Signed)
-   See A-fib above. ?

## 2021-08-26 NOTE — Evaluation (Signed)
Physical Therapy Evaluation ?Patient Details ?Name: Eugene [name] ?MRN: [id number] ?DOB: Jul 07, 1935 ?Today's Date: 08/26/2021 ? ?History of Present Illness ? Eugene [name] is a 86 y.o. male who presents s/p witnessed seizure at home. PMH: hypertension, hyperlipidemia, atrial fibrillation, CVA, AAA s/p stenting, fall in 02/2021 sustaining fractured hip, s/p ORIF and rehab stay ?  ?Clinical Impression ? Pt admitted with above. Pt no longer confused. Pt with 24/7 caregiver at home and received supervision to [facility] for all mobility and ADLs/IADLs. Mobility limited today by a-fib causing fluctuating HR and soft BP. Dr. Grandville Silos asked pt to only amb in room not in hallway today and to check back tomorrow pending his HR and BP could progress to hallway ambulation. Acute PT to cont to follow. ?   ? ?Recommendations for follow up therapy are one component of a multi-disciplinary discharge planning process, led by the attending physician.  Recommendations may be updated based on patient status, additional functional criteria and insurance authorization. ? ?Follow Up Recommendations Home health PT - may not need it ? ?  ?Assistance Recommended at Discharge Frequent or constant Supervision/Assistance  ?Patient can return home with the following ? A little help with walking and/or transfers;A little help with bathing/dressing/bathroom;Assistance with cooking/housework;Direct supervision/assist for medications management;Direct supervision/assist for financial management;Assist for transportation;Help with stairs or ramp for entrance ? ?  ?Equipment Recommendations None recommended by PT (has needed DME)  ?Recommendations for Other Services ?    ?  ?Functional Status Assessment Patient has had a recent decline in their functional status and demonstrates the ability to make significant improvements in function in a reasonable and predictable amount of time.  ? ?  ?Precautions / Restrictions Precautions ?Precautions:  Fall ?Precaution Comments: watch HR, 80-139bpm with activity, BP soft ?Restrictions ?Weight Bearing Restrictions: No  ? ?  ? ?Mobility ? Bed Mobility ?Overal bed mobility: Needs Assistance ?Bed Mobility: Supine to Sit ?  ?  ?Supine to sit: Mod assist, HOB elevated ?  ?  ?General bed mobility comments: pt initiated LEs off EOB and attempted to elevated trunk however ultimately requiring modA from PT ?  ? ?Transfers ?Overall transfer level: Needs assistance ?Equipment used: Rolling walker (2 wheels) ?Transfers: Sit to/from Stand ?Sit to Stand: Mod assist, +2 safety/equipment ?  ?  ?  ?  ?  ?General transfer comment: modA to power up, good hand placement, increased time, posterior lean, tactile cues to bring self forward, pt with narrow base of support ?  ? ?Ambulation/Gait ?Ambulation/Gait assistance: Mod assist, +2 safety/equipment ?Gait Distance (Feet): 10 Feet ?Assistive device: Rolling walker (2 wheels) ?Gait Pattern/deviations: Step-through pattern, Decreased stride length, Narrow base of support ?Gait velocity: slow ?Gait velocity interpretation: <1.8 ft/sec, indicate of risk for recurrent falls ?  ?General Gait Details: pt with posterior lean, HR 139bpm, MD asked to hold on further ambulation, pt very unsteady stating "I'm light footed. I can't even feel my foot going on the ground." Pt denies sensation of his heart racing or dizziness. ? ?Stairs ?  ?  ?  ?  ?  ? ?Wheelchair Mobility ?  ? ?Modified Rankin (Stroke Patients Only) ?  ? ?  ? ?Balance Overall balance assessment: Needs assistance ?Sitting-balance support: No upper extremity supported, Feet supported ?Sitting balance-Leahy Scale: Fair ?Sitting balance - Comments: unable to reach down and pull up socks ?  ?Standing balance support: Bilateral upper extremity supported, During functional activity ?Standing balance-Leahy Scale: Poor ?Standing balance comment: dependent on RW ?  ?  ?  ?  ?  ?  ?  ?  ?  ?  ?  ?   ? ? ? ?  Pertinent Vitals/Pain Pain  Assessment ?Pain Assessment: No/denies pain  ? ? ?Home Living Family/patient expects to be discharged to:: Private residence ?Living Arrangements: Spouse/significant other ?Available Help at Discharge: Family;Personal care attendant;Available 24 hours/day ?Type of Home: House ?Home Access: Level entry ?  ?  ?  ?Home Layout: One level ?Home Equipment: Rolling Walker (2 wheels);Grab bars - tub/shower;Shower seat - built in (lift chair) ?Additional Comments: pt has 24/7 caregiver for himself and his wife that assists with all care  ?  ?Prior Function Prior Level of Function : Needs assist ?  ?  ?  ?  ?  ?  ?Mobility Comments: pt was amb with RW with supervision, pt able to take self to bathroom, no driving, no cooking ?ADLs Comments: someone did yard work, cooking, Education administrator, doesn't drive, supervision for dressing/bathign ?  ? ? ?Hand Dominance  ? Dominant Hand: Right ? ?  ?Extremity/Trunk Assessment  ? Upper Extremity Assessment ?Upper Extremity Assessment: Generalized weakness ?  ? ?Lower Extremity Assessment ?Lower Extremity Assessment: Generalized weakness ?  ? ?Cervical / Trunk Assessment ?Cervical / Trunk Assessment: Kyphotic  ?Communication  ? Communication: HOH  ?Cognition Arousal/Alertness: Awake/alert ?Behavior During Therapy: [facility] for tasks assessed/performed ?Overall Cognitive Status: Within Functional Limits for tasks assessed ?  ?  ?  ?  ?  ?  ?  ?  ?  ?  ?  ?  ?  ?  ?  ?  ?General Comments: pt A&Ox4, followed commands, asked appropriate questions ?  ?  ? ?  ?General Comments General comments (skin integrity, edema, etc.): HR fluctuating from 80-139bpm, pt in A-fib, BP soft 90s/50s, SpO2 89-91% on RA ? ?  ?Exercises    ? ?Assessment/Plan  ?  ?PT Assessment Patient needs continued PT services  ?PT Problem List Decreased strength;Decreased balance;Decreased activity tolerance;Decreased mobility;Decreased coordination;Decreased cognition;Decreased knowledge of use of DME;Decreased safety awareness ? ?   ?   ?PT Treatment Interventions DME instruction;Gait training;Stair training;Functional mobility training;Therapeutic activities;Therapeutic exercise;Balance training   ? ?PT Goals (Current goals can be found in the Care Plan section)  ?Acute Rehab PT Goals ?Patient Stated Goal: figure out whats going on ?PT Goal Formulation: With patient/family ?Time For Goal Achievement: 09/09/21 ?Potential to Achieve Goals: Good ? ?  ?Frequency Min 3X/week ?  ? ? ?Co-evaluation   ?  ?  ?  ?  ? ? ?  ?AM-PAC PT "6 Clicks" Mobility  ?Outcome Measure Help needed turning from your back to your side while in a flat bed without using bedrails?: A Lot ?Help needed moving from lying on your back to sitting on the side of a flat bed without using bedrails?: A Lot ?Help needed moving to and from a bed to a chair (including a wheelchair)?: A Little ?Help needed standing up from a chair using your arms (e.g., wheelchair or bedside chair)?: A Little ?Help needed to walk in hospital room?: A Lot ?Help needed climbing 3-5 steps with a railing? : Total ?6 Click Score: 13 ? ?  ?End of Session Equipment Utilized During Treatment: Gait belt ?Activity Tolerance: Treatment limited secondary to medical complications (Comment) (limited by A-fib) ?Patient left: in chair;with call bell/phone within reach;with chair alarm set;with family/visitor present ?Nurse Communication: Mobility status ?PT Visit Diagnosis: Unsteadiness on feet (R26.81);Muscle weakness (generalized) (M62.81);Difficulty in walking, not elsewhere classified (R26.2) ?  ? ?Time: [id number] ?PT Time Calculation (min) (ACUTE ONLY): 35 min ? ? ?Charges:   PT Evaluation ?$PT Eval Moderate Complexity: 1 Mod ?PT  Treatments ?$Gait Training: 8-22 mins ?  ?   ? ? ?Kittie Plater, PT, DPT ?Acute Rehabilitation Services ?Pager #: [phone] ?Office #: [phone] ? ? ?Corinn Stoltzfus M Hazle Ogburn ?08/26/2021, 12:02 PM ? ?

## 2021-08-26 NOTE — Progress Notes (Signed)
?   08/26/21 0300  ?Assess: MEWS Score  ?BP (!) 89/57  ?Pulse Rate 93  ?ECG Heart Rate 82  ?SpO2 93 %  ?O2 Device Nasal Cannula  ?O2 Flow Rate (L/min) 2 L/min  ?Assess: MEWS Score  ?MEWS Temp 0  ?MEWS Systolic 1  ?MEWS Pulse 0  ?MEWS RR 1  ?MEWS LOC 0  ?MEWS Score 2  ?MEWS Score Color Yellow  ?Assess: if the MEWS score is Yellow or Red  ?Were vital signs taken at a resting state? Yes  ?Focused Assessment No change from prior assessment  ?Early Detection of Sepsis Score *See Row Information* Low  ?MEWS guidelines implemented *See Row Information* Yes  ?Treat  ?MEWS Interventions Administered scheduled meds/treatments;Other (Comment) ?(549ml bolus)  ?Pain Scale 0-10  ?Pain Score 0  ?Take Vital Signs  ?Increase Vital Sign Frequency  Yellow: Q 2hr X 2 then Q 4hr X 2, if remains yellow, continue Q 4hrs  ?Escalate  ?MEWS: Escalate Yellow: discuss with charge nurse/RN and consider discussing with provider and RRT  ?Notify: Charge Nurse/RN  ?Name of Charge Nurse/RN Notified Nehemiah Settle  ?Date Charge Nurse/RN Notified 08/26/21  ?Time Charge Nurse/RN Notified [phone]  ?Notify: Provider  ?Provider Name/Title Dr. Dena Billet  ?Date Provider Notified 08/26/21  ?Time Provider Notified [phone]  ?Notification Type Page  ?Notification Reason Other (Comment) ?(hypotensive)  ?Provider response See new orders  ?Date of Provider Response 08/26/21  ?Time of Provider Response [phone]  ?Document  ?Patient Outcome Stabilized after interventions  ? ? ?

## 2021-08-26 NOTE — Evaluation (Signed)
Clinical/Bedside Swallow Evaluation ?Patient Details  ?Name: Eugene [name] ?MRN: [id number] ?Date of Birth: 11/25/1935 ? ?Today's Date: 08/26/2021 ?Time: SLP Start Time (ACUTE ONLY): [id number] SLP Stop Time (ACUTE ONLY): 1430 ?SLP Time Calculation (min) (ACUTE ONLY): 15 min ? ?Past Medical History:  ?Past Medical History:  ?Diagnosis Date  ? AAA (abdominal aortic aneurysm) 06/29/2007  ? stent graft  ? Aortic stenosis 09/05/2017  ? Mild AS/AR by echo 01/2017.  ? GERD (gastroesophageal reflux disease)   ? Hemochromatosis   ? Hyperlipidemia   ? Hypertension   ? Irritable bowel syndrome   ? Permanent atrial fibrillation ([place])   ? Stroke [facility])   ? Transient Ischemic Attack  ? ?Past Surgical History:  ?Past Surgical History:  ?Procedure Laterality Date  ? ABDOMINAL AORTIC ANEURYSM REPAIR    ? ENDOVASCULAR STENT INSERTION  07/18/2007  ? saccular infrarenal aortic aneurysm  ? EYE SURGERY Bilateral   ? Cataract    ? IR FLUORO GUIDE CV LINE LEFT  12/08/2018  ? IR FLUORO GUIDE CV LINE RIGHT  02/13/2017  ? IR FLUORO GUIDE CV LINE RIGHT  02/25/2017  ? IR US GUIDE VASC ACCESS RIGHT  02/13/2017  ? PICC LINE PLACE PERIPHERAL [facility])  Oct 21, 2014  ? ?HPI:  ?86 y.o. male who presents s/p witnessed seizure at home. PMH: hypertension, hyperlipidemia, atrial fibrillation, CVA, AAA s/p stenting, fall in 02/2021 sustaining fractured hip, s/p ORIF and rehab stay  ?  ?Assessment / Plan / Recommendation  ?Clinical Impression ? Pt presents with suspected grossly intact oropharyngeal function. Oral cavity notable for right lingual bruising and breakdown (pt bite tongue likely during seizure) however he denies pain at site. Clinical swallow evaluation prompted by pt lethargy yesterday per RN report, pt mentation much improved this date. He has been tolerating regular thin liquid diet. During 3 oz water challenge pt exhibited mild wetness in voice, suspect some laryngeal penetration; however throat clear and volitional swallow cleared voice. With small  sips pt was without overt s/sx of aspiration. Continue regular thin liquid diet with aspiration precautions and safe swallowing strategies. No further ST needs identified. ? ?SLP Visit Diagnosis: Dysphagia, unspecified (R13.10) ?   ?Aspiration Risk ? Mild aspiration risk  ?  ?Diet Recommendation   Regular thin liquids  ? ?Medication Administration: Whole meds with liquid  ?  ?Other  Recommendations Oral Care Recommendations: Oral care BID   ? ?Recommendations for follow up therapy are one component of a multi-disciplinary discharge planning process, led by the attending physician.  Recommendations may be updated based on patient status, additional functional criteria and insurance authorization. ? ?Follow up Recommendations No SLP follow up  ? ? ?  ?Assistance Recommended at Discharge    ?Functional Status Assessment Patient has had a recent decline in their functional status and demonstrates the ability to make significant improvements in function in a reasonable and predictable amount of time.  ?Frequency and Duration    ?  ?  ?   ? ?Prognosis    ? ?  ? ?Swallow Study   ?General Date of Onset: 08/25/21 ?HPI: 86 y.o. male who presents s/p witnessed seizure at home. PMH: hypertension, hyperlipidemia, atrial fibrillation, CVA, AAA s/p stenting, fall in 02/2021 sustaining fractured hip, s/p ORIF and rehab stay ?Type of Study: Bedside Swallow Evaluation ?Previous Swallow Assessment: none on file ?Diet Prior to this Study: Regular;Thin liquids ?Temperature Spikes Noted: No ?Respiratory Status: Room air ?History of Recent Intubation: No ?Behavior/Cognition: Alert;Pleasant mood;Cooperative ?Oral Cavity Assessment:  Other (comment) (pt bite tongue, lingual bruising and breakdown noted to right side of tongue) ?Oral Care Completed by SLP: Yes ?Oral Cavity - Dentition: Adequate natural dentition ?Vision: Functional for self-feeding ?Self-Feeding Abilities: Able to feed self ?Patient Positioning: Upright in bed ?Baseline Vocal  Quality: Normal ?Volitional Cough: Strong ?Volitional Swallow: Able to elicit  ?  ?Oral/Motor/Sensory Function Overall Oral Motor/Sensory Function: Within functional limits   ?Ice Chips Ice chips: Within functional limits   ?Thin Liquid Thin Liquid: Impaired ?Presentation: Cup;Straw ?Pharyngeal  Phase Impairments: Suspected delayed Swallow;Wet Vocal Quality;Throat Clearing - Delayed  ?  ?Nectar Thick Nectar Thick Liquid: Not tested   ?Honey Thick Honey Thick Liquid: Not tested   ?Puree Puree: Within functional limits   ?Solid ? ? ?  Solid: Within functional limits  ? ?  ? ?Finger, CCC-SLP ?Acute Rehabilitation Services  ? ?08/26/2021,2:42 PM ? ? ? ?

## 2021-08-26 NOTE — Progress Notes (Signed)
?PROGRESS NOTE ? ? ? ?Evette CristalJames A Querry  ZOX:[id number] DOB: 08-Feb-1936 DOA: 08/25/2021 ?PCP: Georgann HousekeeperHusain, Karrar, MD  ? ? ?Chief Complaint  ?Patient presents with  ? Fall  ?  seiz  ? Seizures  ? ? ?Brief Narrative:  ?Patient is a 86 year old gentleman history of hypertension, hyperlipidemia, atrial fibrillation on chronic anticoagulation, CVA, AAA status post stenting, anxiety presented to the ED with witnessed seizures at home.  Patient noted to be confused/postictal.  Patient noted to have another witnessed seizure in the ED.  Patient had a stat EEG done with no significant seizures noted.  Neurology consulted recommending MRI brain, EEG and patient placed on Keppra.  Patient also noted per urinalysis concerning for UTI and patient placed on empiric IV antibiotics.  Patient also noted to be in A-fib with RVR with soft/low blood pressure.  ? ? ?Assessment & Plan: ? Principal Problem: ?  Seizure (HCC) ?Active Problems: ?  Cardiomegaly NSVT ?  Acute encephalopathy ?  Permanent atrial fibrillation (HCC) on chronic anticoagulation ?  NSVT (nonsustained ventricular tachycardia) (HCC) ?  Renal insufficiency ?  Hypoxia ?  Aneurysm of abdominal vessel (HCC) ?  Benign essential HTN ?  BPH (benign prostatic hyperplasia) ?  Anxiety ?  Chronic anticoagulation ?  History of CVA (cerebrovascular accident) ?  UTI (urinary tract infection) ?  V-tach [facility](HCC) ? ? ? ?Assessment and Plan: ?* Seizure (HCC) ?Patient presents after having a seizure at home and witnessed having second generalized tonic-clonic seizure by ED staff.  ?-Patient noted to be postictal on presentation and noted at baseline to be alert and oriented x3. ?-CT head done negative for any acute abnormalities. ?-MRI ordered and pending.  ?-Patient noted to have been loaded with IV Keppra and currently on Keppra 500 mg twice daily.  ?-Stat EEG done in the ED with no epileptiform discharges noted. ?-Patient seen in consultation by neurology who recommended MRI brain and if  negative could resume home regimen Eliquis.  ?-Per neurology if MRI unable to be done will likely need repeat CT 24 hours from initial CT. ?-Continue treatment for UTI with IV Rocephin. ?-Placed on IV fluids.  ?-Permissive hypertension until MRI is done.  ?-Patient on Valium as needed seizures.  ?-Seizure precautions.  ?-Neurology following and appreciate input and recommendations. ? ?Acute encephalopathy ?- Likely secondary to acute seizures on presentation.   ?-Patient is reportedly alert and oriented x3, but currently alert and oriented only to self at time of admission. ?-.  No focal weakness appreciated on physical exam.  This could be secondary to patient being postictal.  Chest x-ray did not note any acute abnormality. ?-Urinalysis concerning for UTI. ?-Urine cultures pending. ?-MRI pending. ?-Continue IV Rocephin. ?-Continue IV Keppra. ?-Neurology following. ? ? ?Cardiomegaly NSVT ?While in the ED patient was noted to have couple episodes of non-SVT.  Chest x-ray last echocardiogram revealed EF of 50-55% with diastolic function unable to be visualized. ?-BNP noted at 334 patient with no significant shortness of breath or asymptomatic. ?-2D echo with EF of 50 to 55%,NWMA, mild LVH.  Mildly reduced right ventricular systolic function, severely elevated pulmonary arterial systolic pressure.  Moderately dilated left atrial size, moderately dilated right atrial size. ?-Resume home regimen metoprolol. ?-Check echocardiogram given episodes of SVT  ? ?Hypoxia ?Patient had reportedly been hypoxic down into the 60s on room air while having seizure-like activity requiring him to be bagged. Currently O2 saturation maintained on 2 L nasal cannula oxygen.  Chest x-ray had previously been noted to  show no acute abnormality. ?-Continue nasal cannula oxygen as tolerated and wean to room air when able. ? ?Renal insufficiency ?Acute.  Creatinine noted to be 1.29 with BUN 19.  Mildly increased from baseline which has been  around 1-1.1, but not greater than a 0.3 increased from baseline.  Suspect secondary to recent seizure-like activity although no signs of rhabdomyolysis.  ?-IV fluids. ? ?NSVT (nonsustained ventricular tachycardia) (HCC) ?- Potassium noted at 3.9, magnesium at 1.9. ?-Resume home regimen metoprolol. ? ?Permanent atrial fibrillation (HCC) on chronic anticoagulation ?Patient in atrial fibrillation early on this morning with heart rates in the 130s while PT was trying to work with patient.   ?-CT head negative for any bleed.   ?-MRI brain pending.   ?-Cardizem 10 mg IV push ordered as well as resumption of patient's metoprolol however per RN an hour later heart rate noted to be sustaining < 100. ?-Discontinue Cardizem push.   ?-Continue metoprolol.   ?-Normal saline fluid bolus, placed on normal saline at 100 cc an hour.   ?-Supportive care.   ?-If MRI brain is negative per neurology okay to resume home regimen Eliquis.  ? ?UTI (urinary tract infection) ?- Urinalysis with large leukocytes, nitrite negative, many bacteria, WBC > 50. ?-Urine cultures pending. ?-IV Rocephin. ? ?History of CVA (cerebrovascular accident) ?-Patient with prior history of left cerebellum stroke noted on MRI from 2004. ?-Repeat head CT negative for any acute abnormalities. ?-MRI brain pending. ?-If MRI is negative per neurology okay to resume home regimen Eliquis. ?-Continue statin. ? ?Chronic anticoagulation ?- See A-fib above. ? ?Anxiety ?Home medication regimen includes Seroquel 25 mg nightly and Xanax 0.5 mg. ?-Continue Seroquel nightly ?-Resume home regimen Xanax twice daily. ? ?BPH (benign prostatic hyperplasia) ?-Continue Flomax ? ?Benign essential HTN ?-Blood pressure noted to be soft this morning in the 90s.   ?-Continue to hold home regimen of ramipril and furosemide.  -Metoprolol resumed secondary to A-fib.   ?-Normal saline 500 cc bolus x1 and placed on normal saline at 100 cc an hour.   ?-Supportive care.  ? ?Aneurysm of abdominal  vessel (HCC) ?History of AAA status post endograft stenting. ? ? ? ? ?  ? ? ?DVT prophylaxis: SCDs pending MRI brain.  If MRI brain is negative for acute infarct per neurology could resume home regimen Eliquis. ?Code Status: DNR ?Family Communication: Updated patient and daughter at bedside. ?Disposition: TBD ? ?Status is: Observation ?The patient remains OBS appropriate and will d/c before 2 midnights. ?  ?Consultants:  ?Neurology: Dr. Selina Cooley 08/25/2021 ? ?Procedures:  ?CT head CT C-spine 08/25/2021 ?Abdominal films 08/25/2021 ?MRI brain pending ?2D echo 08/25/2021 ?EEG 08/25/2021 ? ? ? ?Antimicrobials:  ?IV Rocephin 08/26/2021 ? ? ?Subjective: ?Sitting up at the side of the bed getting ready to work with physical therapy.  Denies any chest pain.  No shortness of breath.  No abdominal pain.  Denies any palpitations.  Feeling better than he did on admission.  Daughter at bedside.  Heart rate noted in the 130s while PT attempting to work with patient at bedside. ? ?Objective: ?Vitals:  ? 08/26/21 1118 08/26/21 1300 08/26/21 1320 08/26/21 1520  ?BP: (!) 89/64 103/72 101/66 102/67  ?Pulse: 78 83 77 76  ?Resp:  19    ?Temp:  98 ?F (36.7 ?C)    ?TempSrc:  Oral    ?SpO2: 95% 95% 96% 92%  ?Weight:      ?Height:      ? ? ?Intake/Output Summary (Last 24 hours) at  08/26/2021 1658 ?Last data filed at 08/26/2021 1500 ?Gross per 24 hour  ?Intake 1885.52 ml  ?Output 1100 ml  ?Net 785.52 ml  ? ?Filed Weights  ? 08/25/21 0933  ?Weight: 99.8 kg  ? ? ?Examination: ? ?General exam: Appears calm and comfortable  ?Respiratory system: Clear to auscultation.  No wheezes, no crackles, no rhonchi.  Respiratory effort normal. ?Cardiovascular system: Irregularly irregular.  No JVD.  No murmurs rubs or gallops.  No lower extremity edema.   ?Gastrointestinal system: Abdomen is nondistended, soft and nontender. No organomegaly or masses felt. Normal bowel sounds heard. ?Central nervous system: Alert and oriented. No focal neurological  deficits. ?Extremities: Symmetric 5 x 5 power. ?Skin: No rashes, lesions or ulcers ?Psychiatry: Judgement and insight appear normal. Mood & affect appropriate.  ? ? ? ?Data Reviewed:  ? ?CBC: ?Recent Labs  ?Lab 08/25/21 ?0920 04/01/

## 2021-08-26 NOTE — Progress Notes (Signed)
Transition of Care (TOC) - CAGE-AID Screening ? ? ?Patient Details  ?Name: Eugene [name] ?MRN: [id number] ?Date of Birth: 1935/09/05 ? ?Transition of Care (TOC) CM/SW Contact:    ?Janora Norlander, RN ?Phone Number: ?08/26/2021, 7:47 PM ? ? ?Clinical Narrative: ?Pt post-ictal and unable to participate in screening. ? ? ?CAGE-AID Screening: ?Substance Abuse Screening unable to be completed due to: : Patient unable to participate ? ?  ?  ?  ?  ?  ? ?  ? ?  ? ? ? ? ? ? ?

## 2021-08-26 NOTE — Assessment & Plan Note (Addendum)
-   Electrolytes repleted.  ?-Patient maintained back on home regimen of metoprolol.  ?-Outpatient follow-up with primary cardiologist. ?

## 2021-08-26 NOTE — Assessment & Plan Note (Addendum)
-   Urinalysis with large leukocytes, nitrite negative, many bacteria, WBC > 50. ?-Urine cultures > 100,000 colonies Klebsiella pneumoniae. ?-Patient maintained on IV Rocephin and will be discharged home on 4 more days of oral Vantin to complete a 7-day course of antibiotic treatment.   ?-Outpatient follow-up with PCP.   ?

## 2021-08-26 NOTE — Progress Notes (Signed)
SLP Cancellation Note ? ?Patient Details ?Name: [name] ?MRN: [id number] ?DOB: 03-05-1936 ? ? ?Cancelled treatment:       Reason Eval/Treat Not Completed: Other (comment) (attempted x2, pt with PT, then receiving bath with nurse tech; will continue efforts) ? ?Jaycob Mcclenton H. MA, CCC-SLP ?Acute Rehabilitation Services  ? ?08/26/2021, 11:00 AM ?

## 2021-08-26 NOTE — Hospital Course (Signed)
Patient is a 86 year old gentleman history of hypertension, hyperlipidemia, atrial fibrillation on chronic anticoagulation, CVA, AAA status post stenting, anxiety presented to the ED with witnessed seizures at home.  Patient noted to be confused/postictal.  Patient noted to have another witnessed seizure in the ED.  Patient had a stat EEG done with no significant seizures noted.  Neurology consulted recommending MRI brain, EEG and patient placed on Keppra.  Patient also noted per urinalysis concerning for UTI and patient placed on empiric IV antibiotics.  Patient also noted to be in A-fib with RVR with soft/low blood pressure. ?

## 2021-08-26 NOTE — Progress Notes (Signed)
2992: called phlebotomy to notify that blood cultures are meant to be STAT before antibiotics can be administered. They stated they would pass it on in shift report.  ? ?[phone]: blood cultures have still not been collected from patient. Phlebotomy is not answering the phone. Will notify MD. ?

## 2021-08-26 NOTE — Care Management Obs Status (Signed)
MEDICARE OBSERVATION STATUS NOTIFICATION ? ? ?Patient Details  ?Name: Eugene [name] ?MRN: [id number] ?Date of Birth: 1935/10/31 ? ? ?Medicare Observation Status Notification Given:  No (patient unavailable throughout day, currently in procedure.) ? ? ? ?Lawerance Sabal, RN ?08/26/2021, 3:53 PM ?

## 2021-08-27 DIAGNOSIS — I714 Abdominal aortic aneurysm, without rupture, unspecified: Secondary | ICD-10-CM | POA: Diagnosis not present

## 2021-08-27 DIAGNOSIS — G9340 Encephalopathy, unspecified: Secondary | ICD-10-CM | POA: Diagnosis not present

## 2021-08-27 DIAGNOSIS — F419 Anxiety disorder, unspecified: Secondary | ICD-10-CM | POA: Diagnosis not present

## 2021-08-27 DIAGNOSIS — R569 Unspecified convulsions: Secondary | ICD-10-CM | POA: Diagnosis not present

## 2021-08-27 LAB — CBC WITH DIFFERENTIAL/PLATELET
Abs Immature Granulocytes: 0.03 10*3/uL (ref 0.00–0.07)
Basophils Absolute: 0 10*3/uL (ref 0.0–0.1)
Basophils Relative: 0 %
Eosinophils Absolute: 0.2 10*3/uL (ref 0.0–0.5)
Eosinophils Relative: 2 %
HCT: 36.8 % — ABNORMAL LOW (ref 39.0–52.0)
Hemoglobin: 12.5 g/dL — ABNORMAL LOW (ref 13.0–17.0)
Immature Granulocytes: 0 %
Lymphocytes Relative: 20 %
Lymphs Abs: 1.4 10*3/uL (ref 0.7–4.0)
MCH: 32.6 pg (ref 26.0–34.0)
MCHC: 34 g/dL (ref 30.0–36.0)
MCV: 95.8 fL (ref 80.0–100.0)
Monocytes Absolute: 0.8 10*3/uL (ref 0.1–1.0)
Monocytes Relative: 11 %
Neutro Abs: 4.6 10*3/uL (ref 1.7–7.7)
Neutrophils Relative %: 67 %
Platelets: 144 10*3/uL — ABNORMAL LOW (ref 150–400)
RBC: 3.84 MIL/uL — ABNORMAL LOW (ref 4.22–5.81)
RDW: 13.8 % (ref 11.5–15.5)
WBC: 7.1 10*3/uL (ref 4.0–10.5)
nRBC: 0 % (ref 0.0–0.2)

## 2021-08-27 LAB — BASIC METABOLIC PANEL
Anion gap: 9 (ref 5–15)
BUN: 17 mg/dL (ref 8–23)
CO2: 22 mmol/L (ref 22–32)
Calcium: 8.8 mg/dL — ABNORMAL LOW (ref 8.9–10.3)
Chloride: 104 mmol/L (ref 98–111)
Creatinine, Ser: 1.19 mg/dL (ref 0.61–1.24)
GFR, Estimated: >60 mL/min (ref >60)
Glucose, Bld: 89 mg/dL (ref 70–99)
Potassium: 3.3 mmol/L — ABNORMAL LOW (ref 3.5–5.1)
Sodium: 135 mmol/L (ref 135–145)

## 2021-08-27 LAB — MAGNESIUM: Magnesium: 2.3 mg/dL (ref 1.7–2.4)

## 2021-08-27 MED ORDER — LEVETIRACETAM 500 MG PO TABS
500.0000 mg | ORAL_TABLET | Freq: Two times a day (BID) | ORAL | Status: DC
  Administered 2021-08-27 – 2021-08-29 (×5): 500 mg via ORAL
  Filled 2021-08-27 (×5): qty 1

## 2021-08-27 MED ORDER — POTASSIUM CHLORIDE 10 MEQ/100ML IV SOLN
10.0000 meq | INTRAVENOUS | Status: AC
  Administered 2021-08-27 (×2): 10 meq via INTRAVENOUS
  Filled 2021-08-27 (×2): qty 100

## 2021-08-27 MED ORDER — PNEUMOCOCCAL 20-VAL CONJ VACC 0.5 ML IM SUSY
0.5000 mL | PREFILLED_SYRINGE | INTRAMUSCULAR | Status: DC
  Filled 2021-08-27: qty 0.5

## 2021-08-27 MED ORDER — POTASSIUM CHLORIDE CRYS ER 10 MEQ PO TBCR
40.0000 meq | EXTENDED_RELEASE_TABLET | Freq: Once | ORAL | Status: AC
Start: 2021-08-27 — End: 2021-08-27
  Administered 2021-08-27: 40 meq via ORAL
  Filled 2021-08-27: qty 4

## 2021-08-27 NOTE — Evaluation (Signed)
Occupational Therapy Evaluation ?Patient Details ?Name: Eugene [name] ?MRN: [id number] ?DOB: Mar 03, 1936 ?Today's Date: 08/27/2021 ? ? ?History of Present Illness Eugene [name] is a 86 y.o. male who presents s/p witnessed seizure at home. PMH: hypertension, hyperlipidemia, atrial fibrillation, CVA, AAA s/p stenting, fall in 02/2021 sustaining fractured hip, s/p ORIF and rehab stay  ? ?Clinical Impression ?  ?Pt admitted for concerns listed above. PTA pt reported that he was relatively independent with ADL's and functional mobility using a RW. At this time, pt presents with balance deficits, weakness, and decreased activity tolerance. He is requiring min-mod A for functional mobility and most ADL's. Pt requiring RW for stability. Recommending HH OT, as pt has 24/7 assist at home. Acute OT will follow.  ?   ? ?Recommendations for follow up therapy are one component of a multi-disciplinary discharge planning process, led by the attending physician.  Recommendations may be updated based on patient status, additional functional criteria and insurance authorization.  ? ?Follow Up Recommendations ? Home health OT  ?  ?Assistance Recommended at Discharge Frequent or constant Supervision/Assistance  ?Patient can return home with the following A lot of help with walking and/or transfers;A little help with bathing/dressing/bathroom;Assistance with cooking/housework;Direct supervision/assist for medications management;Direct supervision/assist for financial management;Assist for transportation;Help with stairs or ramp for entrance ? ?  ?Functional Status Assessment ? Patient has had a recent decline in their functional status and demonstrates the ability to make significant improvements in function in a reasonable and predictable amount of time.  ?Equipment Recommendations ? None recommended by OT  ?  ?Recommendations for Other Services   ? ? ?  ?Precautions / Restrictions Precautions ?Precautions: Fall ?Precaution  Comments: watch HR and BP ?Restrictions ?Weight Bearing Restrictions: No  ? ?  ? ?Mobility Bed Mobility ?  ?  ?  ?  ?  ?  ?  ?General bed mobility comments: Up with PT on entry ?  ? ?Transfers ?Overall transfer level: Needs assistance ?Equipment used: Rolling walker (2 wheels) ?Transfers: Sit to/from Stand ?Sit to Stand: Mod assist ?  ?  ?  ?  ?  ?General transfer comment: modA to power up, good hand placement, increased time, posterior lean, tactile cues to bring self forward ?  ? ?  ?Balance Overall balance assessment: Needs assistance ?Sitting-balance support: No upper extremity supported, Feet supported ?Sitting balance-Leahy Scale: Fair ?  ?  ?Standing balance support: Bilateral upper extremity supported, During functional activity ?Standing balance-Leahy Scale: Poor ?Standing balance comment: dependent on RW ?  ?  ?  ?  ?  ?  ?  ?  ?  ?  ?  ?   ? ?ADL either performed or assessed with clinical judgement  ? ?ADL Overall ADL's : Needs assistance/impaired ?Eating/Feeding: Set up;Sitting ?  ?Grooming: Min guard;Standing ?  ?Upper Body Bathing: Min guard;Sitting ?  ?Lower Body Bathing: Sitting/lateral leans;Sit to/from stand;Moderate assistance ?  ?Upper Body Dressing : Min guard;Sitting ?  ?Lower Body Dressing: Sitting/lateral leans;Sit to/from stand;Moderate assistance ?  ?Toilet Transfer: Moderate assistance;Ambulation ?  ?Toileting- Clothing Manipulation and Hygiene: Minimal assistance;Sit to/from stand;Sitting/lateral lean ?  ?  ?  ?Functional mobility during ADLs: Minimal assistance;Rolling walker (2 wheels) ?General ADL Comments: Pt limited by weakness and balance deficits  ? ? ? ?Vision Baseline Vision/History: 1 Wears glasses ?Ability to See in Adequate Light: 0 Adequate ?Patient Visual Report: No change from baseline ?Vision Assessment?: No apparent visual deficits  ?   ?Perception   ?  ?Praxis   ?  ? ?  Pertinent Vitals/Pain Pain Assessment ?Pain Assessment: No/denies pain  ? ? ? ?Hand Dominance Right ?   ?Extremity/Trunk Assessment Upper Extremity Assessment ?Upper Extremity Assessment: Overall WFL for tasks assessed ?  ?Lower Extremity Assessment ?Lower Extremity Assessment: Defer to PT evaluation ?  ?Cervical / Trunk Assessment ?Cervical / Trunk Assessment: Kyphotic ?  ?Communication Communication ?Communication: HOH ?  ?Cognition Arousal/Alertness: Awake/alert ?Behavior During Therapy: [facility]FL for tasks assessed/performed ?Overall Cognitive Status: Within Functional Limits for tasks assessed ?  ?  ?  ?  ?  ?  ?  ?  ?  ?  ?  ?  ?  ?  ?  ?  ?General Comments: pt A&Ox4, followed commands, asked appropriate questions ?  ?  ?General Comments  HR noted highest was 125 ? ?  ?Exercises   ?  ?Shoulder Instructions    ? ? ?Home Living Family/patient expects to be discharged to:: Private residence ?Living Arrangements: Spouse/significant other ?Available Help at Discharge: Family;Personal care attendant;Available 24 hours/day ?Type of Home: House ?Home Access: Level entry ?  ?  ?Home Layout: One level ?  ?  ?Bathroom Shower/Tub: Walk-in shower ?  ?Bathroom Toilet: Handicapped height ?Bathroom Accessibility: Yes ?How Accessible: Accessible via walker ?Home Equipment: Rolling Walker (2 wheels);Grab bars - tub/shower;Shower seat - built in;Toilet riser ?  ?Additional Comments: pt has 24/7 caregiver for himself and his wife that assists with all care ?  ? ?  ?Prior Functioning/Environment Prior Level of Function : Needs assist ?  ?  ?  ?  ?  ?  ?Mobility Comments: pt was amb with RW with supervision, pt able to take self to bathroom, no driving, no cooking ?ADLs Comments: someone did yard work, cooking, Education officer, environmentalcleaning, doesn't drive, supervision for dressing/bathign ?  ? ?  ?  ?OT Problem List: Decreased strength;Decreased activity tolerance;Impaired balance (sitting and/or standing);Decreased coordination;Decreased safety awareness;Decreased knowledge of use of DME or AE ?  ?   ?OT Treatment/Interventions: Self-care/ADL  training;Therapeutic exercise;Energy conservation;DME and/or AE instruction;Therapeutic activities;Patient/family education;Balance training  ?  ?OT Goals(Current goals can be found in the care plan section) Acute Rehab OT Goals ?Patient Stated Goal: To go home ?OT Goal Formulation: With patient/family ?Time For Goal Achievement: 09/10/21 ?Potential to Achieve Goals: Good ?ADL Goals ?Pt Will Perform Lower Body Bathing: with modified independence;sitting/lateral leans;sit to/from stand ?Pt Will Perform Lower Body Dressing: with modified independence;sitting/lateral leans;sit to/from stand ?Pt Will Transfer to Toilet: with modified independence;ambulating ?Pt Will Perform Toileting - Clothing Manipulation and hygiene: with modified independence;sitting/lateral leans;sit to/from stand  ?OT Frequency: Min 2X/week ?  ? ?Co-evaluation   ?  ?  ?  ?  ? ?  ?AM-PAC OT "6 Clicks" Daily Activity     ?Outcome Measure Help from another person eating meals?: A Little ?Help from another person taking care of personal grooming?: A Little ?Help from another person toileting, which includes using toliet, bedpan, or urinal?: A Little ?Help from another person bathing (including washing, rinsing, drying)?: A Little ?Help from another person to put on and taking off regular upper body clothing?: A Little ?Help from another person to put on and taking off regular lower body clothing?: A Little ?6 Click Score: 18 ?  ?End of Session Equipment Utilized During Treatment: Gait belt;Rolling walker (2 wheels) ?Nurse Communication: Mobility status ? ?Activity Tolerance: Patient tolerated treatment well ?Patient left: in chair;with call bell/phone within reach;with chair alarm set;with family/visitor present ? ?OT Visit Diagnosis: Unsteadiness on feet (R26.81);Other abnormalities of gait and mobility (  R26.89);Muscle weakness (generalized) (M62.81)  ?              ?Time: 1350-1407 ?OT Time Calculation (min): 17 min ?Charges:  OT General  Charges ?$OT Visit: 1 Visit ?OT Evaluation ?$OT Eval Moderate Complexity: 1 Mod ? ?Eugene Kenneth H., Eugene [name] ?Acute Rehabilitation ? ?Oden Lindaman Elane Bing Plume ?08/27/2021, 4:43 PM ?

## 2021-08-27 NOTE — TOC Initial Note (Signed)
Transition of Care (TOC) - Initial/Assessment Note  ? ? ?Patient Details  ?Name: Eugene [name] ?MRN: [id number] ?Date of Birth: 30-Jan-1936 ? ?Transition of Care (TOC) CM/SW Contact:    ?Carles Collet, RN ?Phone Number: ?08/27/2021, 2:57 PM ? ?Clinical Narrative:        ?Spoke w patient, daughter and son in law at bedside. ?Plan will be for DC to home tomorrow. Discussed therapy recommendations for [facility] services. Patient having difficulty completely clearing throat during conversation. SLP added to PT OT order for evaluation once home.  ?Referral for [facility] services accepted by [facility]. ?No other needs identified at this time. Family to transport home tomorrow          ? ? ?Expected Discharge Plan: [place] ?Barriers to Discharge: Continued Medical Work up ? ? ?Patient Goals and CMS Choice ?Patient states their goals for this hospitalization and ongoing recovery are:: to go home ?CMS Medicare.gov Compare Post Acute Care list provided to:: Patient ?Choice offered to / list presented to : Patient ? ?Expected Discharge Plan and Services ?Expected Discharge Plan: [place] ?  ?Discharge Planning Services: CM Consult ?Post Acute Care Choice: Home Health ?  ?                ?  ?  ?  ?  ?  ?HH Arranged: PT, OT, Speech Therapy ?[place] Agency: [facility] ?Date HH Agency Contacted: 08/27/21 ?Time [place]: [id number] spoke with at [place]: Danise Mina ? ?Prior Living Arrangements/Services ?  ?Lives with:: Self ?  ?Do you feel safe going back to the place where you live?: Yes      ?  ?  ?  ?  ? ?Activities of Daily Living ?Home Assistive Devices/Equipment: None ?ADL Screening (condition at time of admission) ?Patient's cognitive ability adequate to safely complete daily activities?: Yes ?Is the patient deaf or have difficulty hearing?: No ?Does the patient have difficulty seeing, even when wearing glasses/contacts?: Yes ?Does the patient have difficulty concentrating,  remembering, or making decisions?: Yes ?Patient able to express need for assistance with ADLs?: Yes ?Does the patient have difficulty dressing or bathing?: No ?Independently performs ADLs?: Yes (appropriate for developmental age) ?Does the patient have difficulty walking or climbing stairs?: Yes ?Weakness of Legs: Both ?Weakness of Arms/Hands: None ? ?Permission Sought/Granted ?  ?  ?   ?   ?   ?   ? ?Emotional Assessment ?  ?  ?  ?  ?  ?  ? ?Admission diagnosis:  Seizure ([place]) [R56.9] ?V-tach ([place]) [I47.20] ?Patient Active Problem List  ? Diagnosis Date Noted  ? UTI (urinary tract infection) 08/26/2021  ? V-tach [facility])   ? Seizure ([place]) 08/25/2021  ? Cardiomegaly NSVT 08/25/2021  ? BPH (benign prostatic hyperplasia) 08/25/2021  ? NSVT (nonsustained ventricular tachycardia) ([place]) 08/25/2021  ? Renal insufficiency 08/25/2021  ? Anxiety 08/25/2021  ? Chronic anticoagulation 08/25/2021  ? Acute encephalopathy 08/25/2021  ? History of CVA (cerebrovascular accident) 08/25/2021  ? Hypoxia 08/25/2021  ? Hyponatremia 03/24/2021  ? Greater trochanter fracture ([place]) 03/24/2021  ? Hypokalemia 03/24/2021  ? Hypomagnesemia 03/24/2021  ? Hypochloremia 03/24/2021  ? Aortic stenosis 09/05/2017  ? PICC (peripherally inserted central catheter) flush 02/19/2017  ? Benign essential HTN 02/10/2014  ? Permanent atrial fibrillation (HCC) on chronic anticoagulation   ? GERD (gastroesophageal reflux disease)   ? Aftercare following surgery of the circulatory system, [place] 10/13/2012  ? Occlusion and stenosis of  carotid artery without mention of cerebral infarction 09/03/2011  ? Aneurysm of abdominal vessel ([place]) 09/03/2011  ? 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 ?PCP:  [name], MD ?Pharmacy:   ?[id number] [address], [place] ?[address] ?[place] [address] ?Phone: [phone] Fax: [phone] ? ?[facility] ?[address] ?[place] [address] ?Phone:  [phone] Fax: [phone] ? ? ? ? ?Social Determinants of Health (SDOH) Interventions ?  ? ?Readmission Risk Interventions ?   ? View : No data to display.  ?  ?  ?  ? ? ? ?

## 2021-08-27 NOTE — Plan of Care (Signed)
?  Problem: Education: Goal: Knowledge of General Education information will improve Description: Including pain rating scale, medication(s)/side effects and non-pharmacologic comfort measures Outcome: Progressing   Problem: Health Behavior/Discharge Planning: Goal: Ability to manage health-related needs will improve Outcome: Progressing   Problem: Clinical Measurements: Goal: Ability to maintain clinical measurements within normal limits will improve Outcome: Progressing Goal: Will remain free from infection Outcome: Progressing Goal: Diagnostic test results will improve Outcome: Progressing Goal: Respiratory complications will improve Outcome: Progressing Goal: Cardiovascular complication will be avoided Outcome: Progressing   Problem: Activity: Goal: Risk for activity intolerance will decrease Outcome: Progressing   Problem: Nutrition: Goal: Adequate nutrition will be maintained Outcome: Progressing   Problem: Coping: Goal: Level of anxiety will decrease Outcome: Progressing   Problem: Elimination: Goal: Will not experience complications related to bowel motility Outcome: Progressing Goal: Will not experience complications related to urinary retention Outcome: Progressing   Problem: Pain Managment: Goal: General experience of comfort will improve Outcome: Progressing   Problem: Safety: Goal: Ability to remain free from injury will improve Outcome: Progressing   Problem: Skin Integrity: Goal: Risk for impaired skin integrity will decrease Outcome: Progressing   Problem: Education: Goal: Knowledge of disease or condition will improve Outcome: Progressing Goal: Knowledge of secondary prevention will improve (SELECT ALL) Outcome: Progressing Goal: Knowledge of patient specific risk factors will improve (INDIVIDUALIZE FOR PATIENT) Outcome: Progressing   Problem: Coping: Goal: Will verbalize positive feelings about self Outcome: Progressing Goal: Will  identify appropriate support needs Outcome: Progressing   Problem: Health Behavior/Discharge Planning: Goal: Ability to manage health-related needs will improve Outcome: Progressing   Problem: Self-Care: Goal: Ability to participate in self-care as condition permits will improve Outcome: Progressing Goal: Verbalization of feelings and concerns over difficulty with self-care will improve Outcome: Progressing Goal: Ability to communicate needs accurately will improve Outcome: Progressing   Problem: Nutrition: Goal: Risk of aspiration will decrease Outcome: Progressing Goal: Dietary intake will improve Outcome: Progressing   Problem: Ischemic Stroke/TIA Tissue Perfusion: Goal: Complications of ischemic stroke/TIA will be minimized Outcome: Progressing   

## 2021-08-27 NOTE — Progress Notes (Signed)
?PROGRESS NOTE ? ? ? ?ORIE WORSTELL  [id number] DOB: 04/12/1936 DOA: 08/25/2021 ?PCP: [name], MD  ? ? ?Chief Complaint  ?Patient presents with  ? Fall  ?  [place]  ? Seizures  ? ? ?Brief Narrative:  ?Patient is a 86 year old gentleman history of hypertension, hyperlipidemia, atrial fibrillation on chronic anticoagulation, CVA, AAA status post stenting, anxiety presented to the ED with witnessed seizures at home.  Patient noted to be confused/postictal.  Patient noted to have another witnessed seizure in the ED.  Patient had a stat EEG done with no significant seizures noted.  Neurology consulted recommending MRI brain, EEG and patient placed on Keppra.  Patient also noted per urinalysis concerning for UTI and patient placed on empiric IV antibiotics.  Patient also noted to be in A-fib with RVR with soft/low blood pressure.  ? ? ?Assessment & Plan: ? Principal Problem: ?  Seizure ([place]) ?Active Problems: ?  Cardiomegaly NSVT ?  Acute encephalopathy ?  Permanent atrial fibrillation (HCC) on chronic anticoagulation ?  NSVT (nonsustained ventricular tachycardia) ([place]) ?  Renal insufficiency ?  Hypoxia ?  Aneurysm of abdominal vessel ([place]) ?  Benign essential HTN ?  BPH (benign prostatic hyperplasia) ?  Anxiety ?  Chronic anticoagulation ?  History of CVA (cerebrovascular accident) ?  UTI (urinary tract infection) ?  V-tach [facility]) ? ? ? ?Assessment and Plan: ?* Seizure ([place]) ?Patient presents after having a seizure at home and witnessed having second generalized tonic-clonic seizure by ED staff.  ?-Patient noted to be postictal on presentation and noted at baseline to be alert and oriented x3. ?-Per neurology patient likely with a provoked seizure in the setting of UTI, prior history of CVA. ?-CT head done negative for any acute abnormalities. ?-MRI with no acute infarcts. ?-Patient noted to have been loaded with IV Keppra and currently on IV Keppra 500 mg twice daily.  ?-Stat EEG done in the ED with no  epileptiform discharges noted. ?-Patient seen in consultation by neurology who reviewed MRI films, feel patient had a provoked seizure in the setting of UTI and patient with history of prior old strokes.  ?-Neurology recommending continuation of Keppra 500 mg twice daily indefinitely and Eliquis has been resumed.  ?-Change IV Keppra to oral Keppra 500 twice daily. ?-Continue treatment for UTI with IV Rocephin.  ?-Gentle hydration with IV fluids.  ?-Outpatient follow-up with neurology.  ? ?Acute encephalopathy ?- Likely secondary to acute seizures on presentation.   ?-Patient is reportedly alert and oriented x3, but currently alert and oriented only to self at time of admission. ?-.  No focal weakness appreciated on physical exam.  ?-Likely secondary to patient being postictal state in the setting of UTI.   ?-Chest x-ray did not note any acute abnormality. ?-Urinalysis concerning for UTI. ?-Urine cultures > 100,000 colonies of GNR ?-MRI negative for any acute abnormalities ?-Continue IV Rocephin. ?-Change IV Keppra to oral Keppra ?-Neurology following. ? ? ?Cardiomegaly NSVT ?While in the ED patient was noted to have couple episodes of non-SVT.  Chest x-ray last echocardiogram revealed EF of [id number] with diastolic function unable to be visualized. ?-BNP noted at 334 patient with no significant shortness of breath or asymptomatic. ?-2D echo with EF of 50 to 55%,NWMA, mild LVH.  Mildly reduced right ventricular systolic function, severely elevated pulmonary arterial systolic pressure.  Moderately dilated left atrial size, moderately dilated right atrial size. ?-2D echo with EF of 50 to 55%,NWMA, mild LVH, mildly reduced right ventricular systolic function, severely  elevated pulmonary artery systolic pressure.  Moderately dilated left atrial size.  Moderately dilated right atrial size. ?-Continue home regimen metoprolol. ?-Outpatient follow-up with primary cardiologist. ? ?Hypoxia ?Patient had reportedly been hypoxic  down into the 60s on room air while having seizure-like activity requiring him to be bagged. Currently O2 saturation maintained on 2 L nasal cannula oxygen.  Chest x-ray had previously been noted to show no acute abnormality. ?-Continue nasal cannula oxygen as tolerated and wean to room air when able. ? ?Renal insufficiency ?Acute.  Creatinine noted to be 1.29 with BUN 19.  Mildly increased from baseline which has been around 1-1.1, but not greater than a 0.3 increased from baseline.  Suspect secondary to recent seizure-like activity although no signs of rhabdomyolysis.  ?-Continue IV fluids for another 24 hours. ? ?NSVT (nonsustained ventricular tachycardia) ([place]) ?- Potassium noted at 3.3, magnesium at 2.3. ?-Replete potassium. ?-Continue home regimen metoprolol. ? ?Permanent atrial fibrillation (HCC) on chronic anticoagulation ?Patient was in atrial fibrillation with heart rates in the 130s while PT was working with patient (08/26/2021).   ?-CT head negative for any bleed.   ?-MRI brain pending.   ?-Cardizem 10 mg IV push ordered(08/26/2021) as well as resumption of patient's metoprolol however per RN an hour later heart rate noted to be sustaining < 100. ?-Discontinued Cardizem push.   ?-Heart rate elevated this morning in the 140s with patient was transferring from bed to chair however patient remained asymptomatic. ?-We will give patient's metoprolol dose early this morning and monitor heart rates. ?-Continue IV fluids ?-MRI brain negative for any acute abnormalities and as such Eliquis resumed 08/26/2021 per neurology. ?-Supportive care. ? ?UTI (urinary tract infection) ?- Urinalysis with large leukocytes, nitrite negative, many bacteria, WBC > 50. ?-Urine cultures > 100,000 colonies GNR ?-Continue IV Rocephin pending finalization of urine cultures and sensitivities. ? ?History of CVA (cerebrovascular accident) ?-Patient with prior history of left cerebellum stroke noted on MRI from 2004. ?-Repeat head CT negative  for any acute abnormalities. ?-MRI brain negative for any acute infarct. ?-Home regimen Eliquis resumed per neurology 08/26/2021. ?-Continue statin. ? ?Chronic anticoagulation ?- See A-fib above. ? ?Anxiety ?Home medication regimen includes Seroquel 25 mg nightly and Xanax 0.5 mg. ?-Continue Seroquel nightly ?-Continue home regimen Xanax twice daily. ? ?BPH (benign prostatic hyperplasia) ?-Continue Flomax ? ?Benign essential HTN ?-Blood pressure noted to be soft the morning of 08/26/2021.  ?-BP improving. ?-Continue to hold home regimen of ramipril and furosemide.  -Metoprolol resumed secondary to A-fib.   ?-Gentle hydration for the next 24 hours.   ?-Supportive care.  ? ?Aneurysm of abdominal vessel ([place]) ?History of AAA status post endograft stenting. ? ? ? ? ?  ? ? ?DVT prophylaxis: Eliquis ?Code Status: DNR ?Family Communication: Updated patient. No family at bedside ?Disposition: TBD, likely home with home health. ? ?Status is: Inpatient ? ?  ?Consultants:  ?Neurology: Dr. Quinn Axe 08/25/2021 ? ?Procedures:  ?CT head CT C-spine 08/25/2021 ?Abdominal films 08/25/2021 ?MRI brain 08/26/2021 ?2D echo 08/25/2021 ?EEG 08/25/2021 ? ? ? ?Antimicrobials:  ?IV Rocephin 08/26/2021>>> ? ? ?Subjective: ?Sitting up in chair.  Noted to have heart rates rising in the 140s when transferring from bed to chair early this morning per RN.  Heart rate in the low 100s while sitting up.  Denies chest pain.  No shortness of breath.  No syncope.  No further seizures noted.  Denies any palpitations.  Tolerating oral intake.  Overall feeling better.   ? ?Objective: ?Vitals:  ? 08/27/21  0100 08/27/21 0339 08/27/21 0600 08/27/21 0700  ?BP:  107/63  123/86  ?Pulse: 65 79  93  ?Resp:  20    ?Temp:  98.4 ?F (36.9 ?C)  98 ?F (36.7 ?C)  ?TempSrc:  Oral  Oral  ?SpO2: 93% 96%  95%  ?Weight:   94.4 kg   ?Height:      ? ? ?Intake/Output Summary (Last 24 hours) at 08/27/2021 1006 ?Last data filed at 08/27/2021 0900 ?Gross per 24 hour  ?Intake 3466.58 ml  ?Output 1520 ml   ?Net 1946.58 ml  ? ?Filed Weights  ? 08/25/21 0933 08/27/21 0600  ?Weight: 99.8 kg 94.4 kg  ? ? ?Examination: ? ?General exam: NAD ?Respiratory system: CTA B.  No wheezes, no crackles, no rhonchi.  Normal res

## 2021-08-27 NOTE — Progress Notes (Signed)
Physical Therapy Treatment ?Patient Details ?Name: Eugene [name] ?MRN: [id number] ?DOB: 10/11/1935 ?Today's Date: 08/27/2021 ? ? ?History of Present Illness Eugene [name] is a 86 y.o. male who presents s/p witnessed seizure at home. PMH: hypertension, hyperlipidemia, atrial fibrillation, CVA, AAA s/p stenting, fall in 02/2021 sustaining fractured hip, s/p ORIF and rehab stay ? ?  ?PT Comments  ? ? Continuing work on functional mobility and activity tolerance;  Pt's afib/HR more under control today, and able to walk in the hallway with min assist (chair follow for safety); good progress -- need more details re: how much assist family can give him at home, but overall on track for dc home with HHPT follow up  ?Recommendations for follow up therapy are one component of a multi-disciplinary discharge planning process, led by the attending physician.  Recommendations may be updated based on patient status, additional functional criteria and insurance authorization. ? ?Follow Up Recommendations ? Home health PT ?  ?  ?Assistance Recommended at Discharge Frequent or constant Supervision/Assistance  ?Patient can return home with the following A little help with walking and/or transfers;A little help with bathing/dressing/bathroom;Assistance with cooking/housework;Direct supervision/assist for medications management;Direct supervision/assist for financial management;Assist for transportation;Help with stairs or ramp for entrance ?  ?Equipment Recommendations ? None recommended by PT (has needed DME)  ?  ?Recommendations for Other Services   ? ? ?  ?Precautions / Restrictions Precautions ?Precautions: Fall ?Precaution Comments: watch HR and BP ?Restrictions ?Weight Bearing Restrictions: No  ?  ? ?Mobility ? Bed Mobility ?  ?  ?  ?  ?  ?  ?  ?  ?  ? ?Transfers ?Overall transfer level: Needs assistance ?Equipment used: Rolling walker (2 wheels) ?Transfers: Sit to/from Stand ?Sit to Stand: Mod assist ?  ?  ?  ?  ?   ?General transfer comment: modA to power up, good hand placement, increased time, posterior lean, tactile cues to bring self forward ?  ? ?Ambulation/Gait ?Ambulation/Gait assistance: Mod assist, Min assist ?Gait Distance (Feet): 70 Feet ?Assistive device: Rolling walker (2 wheels) ?Gait Pattern/deviations: Step-through pattern, Decreased stride length, Trunk flexed ?Gait velocity: slow ?  ?  ?General Gait Details: Cues for posture and to lengthen steps; Heavy dependence on RW for support and steadiness, but no overt loss of balance observed, and no posterior lean with amb; slightly errratic step width; Occasional light mod assist for RW management ? ? ?Stairs ?  ?  ?  ?  ?  ? ? ?Wheelchair Mobility ?  ? ?Modified Rankin (Stroke Patients Only) ?  ? ? ?  ?Balance   ?  ?Sitting balance-Leahy Scale: Fair ?  ?  ?  ?Standing balance-Leahy Scale: Poor ?  ?  ?  ?  ?  ?  ?  ?  ?  ?  ?  ?  ?  ? ?  ?Cognition Arousal/Alertness: Awake/alert ?Behavior During Therapy: [facility]FL for tasks assessed/performed ?Overall Cognitive Status: Within Functional Limits for tasks assessed ?  ?  ?  ?  ?  ?  ?  ?  ?  ?  ?  ?  ?  ?  ?  ?  ?General Comments: pt A&Ox4, followed commands, asked appropriate questions ?  ?  ? ?  ?Exercises   ? ?  ?General Comments General comments (skin integrity, edema, etc.): HR initially 92bpm; noted up to 124 with progressive ambulation ?  ?  ? ?Pertinent Vitals/Pain Pain Assessment ?Pain Assessment: Faces ?Faces Pain Scale: Hurts a  little bit ?Pain Location: L forearm; pt seems to think it's from teh potassium ?Pain Descriptors / Indicators: Aching ?Pain Intervention(s): Monitored during session  ? ? ?Home Living   ?  ?  ?  ?  ?  ?  ?  ?  ?  ?   ?  ?Prior Function    ?  ?  ?   ? ?PT Goals (current goals can now be found in the care plan section) Acute Rehab PT Goals ?Patient Stated Goal: Agreeable to amb ?PT Goal Formulation: With patient/family ?Time For Goal Achievement: 09/09/21 ?Potential to Achieve Goals:  Good ?Progress towards PT goals: Progressing toward goals ? ?  ?Frequency ? ? ? Min 3X/week ? ? ? ?  ?PT Plan Current plan remains appropriate  ? ? ?Co-evaluation   ?  ?  ?  ?  ? ?  ?AM-PAC PT "6 Clicks" Mobility   ?Outcome Measure ? Help needed turning from your back to your side while in a flat bed without using bedrails?: A Little ?Help needed moving from lying on your back to sitting on the side of a flat bed without using bedrails?: A Lot ?Help needed moving to and from a bed to a chair (including a wheelchair)?: A Little ?Help needed standing up from a chair using your arms (e.g., wheelchair or bedside chair)?: A Little ?Help needed to walk in hospital room?: A Little ?Help needed climbing 3-5 steps with a railing? : A Lot ?6 Click Score: 16 ? ?  ?End of Session Equipment Utilized During Treatment: Gait belt ?Activity Tolerance: Patient tolerated treatment well ?Patient left: Other (comment) (working with Occupational Therapy) ?Nurse Communication: Mobility status ?PT Visit Diagnosis: Unsteadiness on feet (R26.81);Muscle weakness (generalized) (M62.81);Difficulty in walking, not elsewhere classified (R26.2) ?  ? ? ?Time: [id number] ?PT Time Calculation (min) (ACUTE ONLY): 21 min ? ?Charges:  $Gait Training: 8-22 mins          ?          ? ?Roney Marion, PT  ?Acute Rehabilitation Services ?Pager [phone] ?Office [phone] ? ? ? ?[place] ?08/27/2021, 2:04 PM ? ?

## 2021-08-28 ENCOUNTER — Inpatient Hospital Stay (HOSPITAL_COMMUNITY): Payer: Medicare Other

## 2021-08-28 DIAGNOSIS — R569 Unspecified convulsions: Secondary | ICD-10-CM | POA: Diagnosis not present

## 2021-08-28 DIAGNOSIS — I10 Essential (primary) hypertension: Secondary | ICD-10-CM | POA: Diagnosis not present

## 2021-08-28 DIAGNOSIS — F419 Anxiety disorder, unspecified: Secondary | ICD-10-CM | POA: Diagnosis not present

## 2021-08-28 DIAGNOSIS — G9340 Encephalopathy, unspecified: Secondary | ICD-10-CM | POA: Diagnosis not present

## 2021-08-28 LAB — CBC
HCT: 35.2 % — ABNORMAL LOW (ref 39.0–52.0)
Hemoglobin: 11.8 g/dL — ABNORMAL LOW (ref 13.0–17.0)
MCH: 32.2 pg (ref 26.0–34.0)
MCHC: 33.5 g/dL (ref 30.0–36.0)
MCV: 96.2 fL (ref 80.0–100.0)
Platelets: 149 10*3/uL — ABNORMAL LOW (ref 150–400)
RBC: 3.66 MIL/uL — ABNORMAL LOW (ref 4.22–5.81)
RDW: 13.8 % (ref 11.5–15.5)
WBC: 7.5 10*3/uL (ref 4.0–10.5)
nRBC: 0 % (ref 0.0–0.2)

## 2021-08-28 LAB — URINE CULTURE
Culture: >=100000 — AB
Special Requests: NORMAL

## 2021-08-28 LAB — BASIC METABOLIC PANEL
Anion gap: 7 (ref 5–15)
BUN: 16 mg/dL (ref 8–23)
CO2: 21 mmol/L — ABNORMAL LOW (ref 22–32)
Calcium: 8.8 mg/dL — ABNORMAL LOW (ref 8.9–10.3)
Chloride: 106 mmol/L (ref 98–111)
Creatinine, Ser: 1.25 mg/dL — ABNORMAL HIGH (ref 0.61–1.24)
GFR, Estimated: 57 mL/min — ABNORMAL LOW (ref >60)
Glucose, Bld: 89 mg/dL (ref 70–99)
Potassium: 3.8 mmol/L (ref 3.5–5.1)
Sodium: 134 mmol/L — ABNORMAL LOW (ref 135–145)

## 2021-08-28 LAB — MAGNESIUM: Magnesium: 1.9 mg/dL (ref 1.7–2.4)

## 2021-08-28 MED ORDER — POTASSIUM CHLORIDE CRYS ER 20 MEQ PO TBCR
20.0000 meq | EXTENDED_RELEASE_TABLET | Freq: Every day | ORAL | Status: DC
Start: 2021-08-31 — End: 2021-09-15

## 2021-08-28 MED ORDER — ACETAMINOPHEN 325 MG PO TABS: 650.0000 mg | ORAL_TABLET | Freq: Four times a day (QID) | ORAL | Status: DC | PRN

## 2021-08-28 MED ORDER — LEVETIRACETAM 500 MG PO TABS: 500.0000 mg | ORAL_TABLET | Freq: Two times a day (BID) | ORAL | 1 refills | Status: DC

## 2021-08-28 MED ORDER — FUROSEMIDE 40 MG PO TABS: 40.0000 mg | ORAL_TABLET | Freq: Every day | ORAL | Status: DC

## 2021-08-28 MED ORDER — RAMIPRIL 5 MG PO CAPS: 5.0000 mg | ORAL_CAPSULE | Freq: Every day | ORAL | Status: DC

## 2021-08-28 MED ORDER — IBUPROFEN 200 MG PO TABS: 400.0000 mg | ORAL_TABLET | Freq: Three times a day (TID) | ORAL | Status: DC

## 2021-08-28 MED ORDER — CEFPODOXIME PROXETIL 200 MG PO TABS
200.0000 mg | ORAL_TABLET | Freq: Two times a day (BID) | ORAL | 0 refills | Status: AC
Start: 2021-08-28 — End: 2021-09-01

## 2021-08-28 MED ORDER — PANTOPRAZOLE SODIUM 40 MG PO TBEC
40.0000 mg | DELAYED_RELEASE_TABLET | Freq: Every day | ORAL | Status: DC
  Administered 2021-08-28 – 2021-08-29 (×2): 40 mg via ORAL
  Filled 2021-08-28 (×2): qty 1

## 2021-08-28 MED ORDER — IBUPROFEN 200 MG PO TABS
400.0000 mg | ORAL_TABLET | Freq: Three times a day (TID) | ORAL | Status: DC
  Administered 2021-08-28 – 2021-08-29 (×3): 400 mg via ORAL
  Filled 2021-08-28 (×3): qty 2

## 2021-08-28 NOTE — TOC Transition Note (Signed)
Transition of Care (TOC) - CM/SW Discharge Note ? ? ?Patient Details  ?Name: Eugene [name] ?MRN: [id number] ?Date of Birth: [birth year] ? ?Transition of Care (TOC) CM/SW Contact:  ?Kermit Balo, RN ?Phone Number: ?08/28/2021, 11:17 AM ? ? ?Clinical Narrative:    ?Patient is discharging home with home health services through [facility]. Jen with [facility] aware of discharge home today.  ?Patient has transportation home.  ? ? ?Final next level of care: Home w Home Health Services ?Barriers to Discharge: No Barriers Identified ? ? ?Patient Goals and CMS Choice ?Patient states their goals for this hospitalization and ongoing recovery are:: to go home ?CMS Medicare.gov Compare Post Acute Care list provided to:: Patient ?Choice offered to / list presented to : Patient ? ?Discharge Placement ?  ?           ?  ?  ?  ?  ? ?Discharge Plan and Services ?  ?Discharge Planning Services: CM Consult ?Post Acute Care Choice: Home Health          ?  ?  ?  ?  ?  ?HH Arranged: PT, OT, Speech Therapy ?HH Agency: [facility] ?Date HH Agency Contacted: 08/28/21 ?Time HH Agency Contacted: [phone] ?Representative spoke with at [facility] Agency: Candise Bowens ? ?Social Determinants of Health (SDOH) Interventions ?  ? ? ?Readmission Risk Interventions ?   ? View : No data to display.  ?  ?  ?  ? ? ? ? ? ?

## 2021-08-28 NOTE — Care Plan (Signed)
Just spoke with physical therapy who stated patient was assessed and patient is a two-person assist today with complaints of right shoulder pain.  Physical therapy does not feel patient can use his right arm at this time and does not feel patient is safe to be discharged home today physical therapy to reassess tomorrow.   ? ?No charge. ?

## 2021-08-28 NOTE — Discharge Summary (Addendum)
Physician Discharge Summary  ?Eugene [name] [id number] DOB: 11/30/1935 DOA: 08/25/2021 ? ?PCP: [name], MD ? ?Admit date: 08/25/2021 ?Discharge date: 08/29/2021 ? ?Time spent: 60 minutes ? ?Recommendations for Outpatient Follow-up:  ?Follow-up with [name], MD in 2 weeks.  On follow-up patient need a basic metabolic profile, magnesium level done to follow-up on electrolytes and renal function.  UTI will need to be followed up upon.  Blood pressure need to be reassessed. ?Follow-up with [facility] neurology in the outpatient setting.  Office will call with an appointment time for follow-up on seizures. ?Follow-up with Dr. Fransico Him, cardiology in 1 week. ? ? ?Discharge Diagnoses:  ?Principal Problem: ?  Seizure ([place]) ?Active Problems: ?  Cardiomegaly NSVT ?  Acute encephalopathy ?  Permanent atrial fibrillation (HCC) on chronic anticoagulation ?  NSVT (nonsustained ventricular tachycardia) ([place]) ?  Renal insufficiency ?  Hypoxia ?  Aneurysm of abdominal vessel ([place]) ?  Benign essential HTN ?  BPH (benign prostatic hyperplasia) ?  Anxiety ?  Chronic anticoagulation ?  History of CVA (cerebrovascular accident) ?  UTI (urinary tract infection) ?  V-tach [facility]) ?  Shoulder pain ? ? ?Discharge Condition: Stable and improved ? ?Diet recommendation: Heart healthy ? ?Filed Weights  ? 08/25/21 0933 08/27/21 0600  ?Weight: 99.8 kg 94.4 kg  ? ? ?History of present illness:  ?HPI per Dr. Tamala Julian ? Eugene [name] is a 86 y.o. male with medical history significant of hypertension, hyperlipidemia, atrial fibrillation, CVA, AAA s/p stenting, and anxiety presents after being witnessed by his home caregiver having seizure.  History is obtained from the patient's daughter-in-law who is present at bedside as the patient is still confused.  Apparently, he had been found on the floor this morning beside his chair.  The home caregiver had been able to get him back to a chair, but it was then reported that they noticed  him having generalized tonic-clonic activity that lasted several minutes.  EMS was called and patient was noted to be confused.  At baseline patient ambulates with the use of a walker and is normally alert and oriented x3. He has a DO NOT RESUSCITATE order in place that family confirms.  At this time he is not back to his baseline but is more alert and able to say his name.  However, upon further questioning patient just repeats "I am okay" ?  ?  ?Upon admission into the emergency department patient was seen to be afebrile with pulse 116, and all other vital signs maintained.  Labs significant for hemoglobin 12.7, sodium 132, BUN 19, creatinine 1.29, total bilirubin 1.3, and CK 163.  Chest x-ray noted concern for new cardiomegaly without any acute process appreciated.  CT scan of the head and cervical spine did not note any acute abnormalities.  While in the ED patient had second seizure witnessed by nursing that was described as generalized tonic-clonic lasting approximately 1 minute.  He was noted to be hypoxic temporarily in the 60s and required to be bagged, but O2 saturation currently maintained on 2 L nasal cannula oxygen.  Thereafter reported to have several short runs of SVT.  Neurology have been formally consulted.  Patient had stat EEG that was suggestive of moderate to severe diffuse encephalopathy with nonspecific etiology.  Patient being given 1 L normal saline IV fluids, magnesium sulfate 2 g IV, and Keppra 1 g IV. ?  ?Hospital Course:  ? ?Assessment and Plan: ?* Seizure ([place]) ?Patient presents after having a seizure at  home and witnessed having second generalized tonic-clonic seizure by ED staff.  ?-Patient noted to be postictal on presentation and noted at baseline to be alert and oriented x3. ?-Per neurology patient likely with a provoked seizure in the setting of UTI, prior history of CVA. ?-CT head done negative for any acute abnormalities. ?-MRI with no acute infarcts. ?-Patient noted to have  been loaded with IV Keppra and then placed on maintenance dose IV Keppra 500 mg twice daily. ?-Stat EEG done in the ED with no epileptiform discharges noted. ?-Patient seen in consultation by neurology who reviewed MRI films, feel patient had a provoked seizure in the setting of UTI and patient with history of prior old strokes.  ?-Neurology recommended continuation of Keppra 500 mg twice daily indefinitely and Eliquis resumed.  ?-Patient subsequently transitioned from IV Keppra to oral Keppra twice daily.   ?-Patient also placed on IV antibiotics for treatment of Klebsiella pneumoniae UTI.   ?-Patient improved clinically had no further seizure-like episodes and will be discharged home in stable and improved condition.  -Outpatient neurology referral made for outpatient follow-up.  ? ?Acute encephalopathy ?- Likely secondary to acute seizures on presentation.   ?-Patient is reportedly alert and oriented x3, but alert and oriented only to self at time of admission. ?-.  No focal weakness appreciated on physical exam.  ?-Likely secondary to patient being postictal state in the setting of UTI.   ?-Chest x-ray did not note any acute abnormality. ?-Urinalysis concerning for UTI. ?-Urine cultures > 100,000 Klebsiella pneumoniae.  ?-MRI negative for any acute abnormalities ?-Patient placed on antiepileptic medication during the hospitalization as well as IV antibiotics. ?-Patient followed by neurology. ?-Patient improved clinically was transitioned to oral Keppra which she will be discharged home on indefinitely. ?-Patient also be discharged home on oral Vantin for 4 more days to complete a 7-day course of antibiotic treatment. ?-Outpatient follow-up with PCP. ? ? ?Cardiomegaly NSVT ?While in the ED patient was noted to have couple episodes of non-SVT.  Chest x-ray last echocardiogram revealed EF of [id number] with diastolic function unable to be visualized. ?-BNP noted at 334 patient with no significant shortness of breath  or asymptomatic. ?-2D echo with EF of 50 to 55%,NWMA, mild LVH.  Mildly reduced right ventricular systolic function, severely elevated pulmonary arterial systolic pressure.  Moderately dilated left atrial size, moderately dilated right atrial size. ?-Patient's electrolytes repleted. ?-Patient maintained on home regimen metoprolol. ?-Outpatient follow-up with primary cardiologist. ? ?Hypoxia ?Patient had reportedly been hypoxic down into the 60s on room air while having seizure-like activity requiring him to be bagged. Currently O2 saturation maintained on 2 L nasal cannula oxygen.  Chest x-ray had previously been noted to show no acute abnormality. ?-Hypoxia improved and had resolved by day of discharge. ?-On day of discharge patient was satting 98% on room air. ? ?Renal insufficiency ?Acute.  Creatinine noted to be 1.29 with BUN 19.  Mildly increased from baseline which has been around 1-1.1, but not greater than a 0.3 increased from baseline.  Suspect secondary to recent seizure-like activity although no signs of rhabdomyolysis.  ?-Patient gently hydrated with IV fluids.   ?-Outpatient follow-up.   ? ?NSVT (nonsustained ventricular tachycardia) ([place]) ?- Electrolytes repleted.  ?-Patient maintained back on home regimen of metoprolol.  ?-Outpatient follow-up with primary cardiologist. ? ?Permanent atrial fibrillation (HCC) on chronic anticoagulation ?Patient was in atrial fibrillation with heart rates in the 130s while PT was working with patient (08/26/2021).   ?-CT head negative for  any bleed.   ?-MRI brain negative for any acute abnormalities ?-Cardizem 10 mg IV push ordered(08/26/2021) as well as resumption of patient's metoprolol however per RN an hour later heart rate noted to be sustaining < 100. ?-Discontinued Cardizem push.   ?-Heart rate elevated this morning in the 140s with patient transferring from bed to chair however patient remained asymptomatic. ?-Patient given home regimen of metoprolol earlier than  scheduled with improvement with heart rates.   ?-Patient hydrated with IV fluids.   ?-MRI brain negative for any acute abnormalities and as such Eliquis resumed 08/26/2021 per neurology. ?-Outpatient follow-up w

## 2021-08-28 NOTE — Progress Notes (Signed)
Physical Therapy Treatment ?Patient Details ?Name: Eugene [name] ?MRN: [id number] ?DOB: [birth year] ?Today's Date: 08/28/2021 ? ? ?History of Present Illness Eugene [name] is a 86 y.o. male who presents s/p witnessed seizure at home. PMH: hypertension, hyperlipidemia, atrial fibrillation, CVA, AAA s/p stenting, fall in 02/2021 sustaining fractured hip, s/p ORIF and rehab stay ? ?  ?PT Comments  ? ? Pt with significant R should pain today greatly limiting his ability to use it functionally. Pt unable to raise R UE, use it for transfers, or WB on it during ambulation. Pt requiring mod/max assist for transfers today and was unable to amb >10' with RW. Pt ambulated 100' yesterday with min guard assist. Spoke with Dr. Grandville Silos regarding pt's shoulder and functional regression. MD ordered iburpofen for pt in hopes to minimize R shld pain. PT to re-assess pt tomorrow for d/c. ?  ?Recommendations for follow up therapy are one component of a multi-disciplinary discharge planning process, led by the attending physician.  Recommendations may be updated based on patient status, additional functional criteria and insurance authorization. ? ?Follow Up Recommendations ? Home health PT ?  ?  ?Assistance Recommended at Discharge Frequent or constant Supervision/Assistance  ?Patient can return home with the following A little help with walking and/or transfers;A little help with bathing/dressing/bathroom;Assistance with cooking/housework;Direct supervision/assist for medications management;Direct supervision/assist for financial management;Assist for transportation;Help with stairs or ramp for entrance ?  ?Equipment Recommendations ? None recommended by PT (has needed DME)  ?  ?Recommendations for Other Services   ? ? ?  ?Precautions / Restrictions Precautions ?Precautions: Fall ?Restrictions ?Weight Bearing Restrictions: No  ?  ? ?Mobility ? Bed Mobility ?Overal bed mobility: Needs Assistance ?Bed Mobility: Supine to Sit ?  ?   ?Supine to sit: Mod assist, HOB elevated ?  ?  ?General bed mobility comments: modA for trunk elevation, unable to use R UE due to onset of severe pain ?  ? ?Transfers ?Overall transfer level: Needs assistance ?Equipment used: Rolling walker (2 wheels) ?Transfers: Sit to/from Stand, Bed to chair/wheelchair/BSC ?Sit to Stand: Mod assist, +2 physical assistance ?  ?Step pivot transfers: Mod assist, +2 safety/equipment ?  ?  ?  ?General transfer comment: pt unable to push up with R UE today due to onset of pain requiring increased assist, completed 3 step pvt transfesr to/from commode/chair ?  ? ?Ambulation/Gait ?Ambulation/Gait assistance: Mod assist, +2 safety/equipment ?Gait Distance (Feet): 10 Feet (unable to attempt a second time) ?Assistive device: Rolling walker (2 wheels) ?Gait Pattern/deviations: Step-through pattern, Decreased stride length, Trunk flexed ?Gait velocity: slow ?Gait velocity interpretation: <1.8 ft/sec, indicate of risk for recurrent falls ?  ?General Gait Details: pt with significant trunk flexion today, inability to hold self up with R UE and R LE with noted weakness/instability due to inability to off -weight it with R UE. Upon second attempt to ambulation pt had a BM and required return to room for hygiene ? ? ?Stairs ?  ?  ?  ?  ?  ? ? ?Wheelchair Mobility ?  ? ?Modified Rankin (Stroke Patients Only) ?  ? ? ?  ?Balance Overall balance assessment: Needs assistance ?Sitting-balance support: No upper extremity supported, Feet supported ?Sitting balance-Leahy Scale: Fair ?Sitting balance - Comments: unable to reach down and pull up socks ?  ?Standing balance support: Bilateral upper extremity supported, During functional activity ?Standing balance-Leahy Scale: Poor ?Standing balance comment: dependent on RW ?  ?  ?  ?  ?  ?  ?  ?  ?  ?  ?  ?  ? ?  ?  Cognition Arousal/Alertness: Awake/alert ?Behavior During Therapy: [facility] for tasks assessed/performed ?Overall Cognitive Status: Within Functional  Limits for tasks assessed ?  ?  ?  ?  ?  ?  ?  ?  ?  ?  ?  ?  ?  ?  ?  ?  ?General Comments: pt A&Ox4, followed commands, depressed spirits due to R shld pain and inability to amb today ?  ?  ? ?  ?Exercises   ? ?  ?General Comments General comments (skin integrity, edema, etc.): VSS however pt with significant R shld pain ?  ?  ? ?Pertinent Vitals/Pain Pain Assessment ?Pain Assessment: 0-10 ?Pain Score: 8  ?Pain Location: R deltoid to touch, specifically posteriorly, unable to lift his arm ?Pain Descriptors / Indicators: Aching, Jabbing, Sore, Sharp ?Pain Intervention(s):  (spoke with MD who ordered Ibuprofen)  ? ? ?Home Living   ?  ?  ?  ?  ?  ?  ?  ?  ?  ?   ?  ?Prior Function    ?  ?  ?   ? ?PT Goals (current goals can now be found in the care plan section) Acute Rehab PT Goals ?PT Goal Formulation: With patient/family ?Time For Goal Achievement: 09/09/21 ?Potential to Achieve Goals: Good ?Progress towards PT goals: Progressing toward goals ? ?  ?Frequency ? ? ? Min 3X/week ? ? ? ?  ?PT Plan Current plan remains appropriate  ? ? ?Co-evaluation   ?  ?  ?  ?  ? ?  ?AM-PAC PT "6 Clicks" Mobility   ?Outcome Measure ? Help needed turning from your back to your side while in a flat bed without using bedrails?: A Little ?Help needed moving from lying on your back to sitting on the side of a flat bed without using bedrails?: A Lot ?Help needed moving to and from a bed to a chair (including a wheelchair)?: A Little ?Help needed standing up from a chair using your arms (e.g., wheelchair or bedside chair)?: A Little ?Help needed to walk in hospital room?: A Little ?Help needed climbing 3-5 steps with a railing? : A Lot ?6 Click Score: 16 ? ?  ?End of Session Equipment Utilized During Treatment: Gait belt ?Activity Tolerance: Patient tolerated treatment well ?Patient left: in chair;with call bell/phone within reach;with chair alarm set;with family/visitor present ?Nurse Communication: Mobility status ?PT Visit Diagnosis:  Unsteadiness on feet (R26.81);Muscle weakness (generalized) (M62.81);Difficulty in walking, not elsewhere classified (R26.2) ?  ? ? ?Time: [id number] ?PT Time Calculation (min) (ACUTE ONLY): 35 min ? ?Charges:  $Gait Training: 8-22 mins ?$Therapeutic Activity: 8-22 mins          ?          ? ?Kittie Plater, PT, DPT ?Acute Rehabilitation Services ?Secure chat preferred ?Office #: [phone] ? ? ? ?Lucah Petta M Shawnita Krizek ?08/28/2021, 1:04 PM ? ?

## 2021-08-29 DIAGNOSIS — M25519 Pain in unspecified shoulder: Secondary | ICD-10-CM | POA: Clinically undetermined

## 2021-08-29 LAB — BASIC METABOLIC PANEL
Anion gap: 9 (ref 5–15)
BUN: 17 mg/dL (ref 8–23)
CO2: 23 mmol/L (ref 22–32)
Calcium: 8.7 mg/dL — ABNORMAL LOW (ref 8.9–10.3)
Chloride: 102 mmol/L (ref 98–111)
Creatinine, Ser: 1.25 mg/dL — ABNORMAL HIGH (ref 0.61–1.24)
GFR, Estimated: 57 mL/min — ABNORMAL LOW (ref >60)
Glucose, Bld: 125 mg/dL — ABNORMAL HIGH (ref 70–99)
Potassium: 3.6 mmol/L (ref 3.5–5.1)
Sodium: 134 mmol/L — ABNORMAL LOW (ref 135–145)

## 2021-08-29 LAB — CBC WITH DIFFERENTIAL/PLATELET
Abs Immature Granulocytes: 0.03 10*3/uL (ref 0.00–0.07)
Basophils Absolute: 0 10*3/uL (ref 0.0–0.1)
Basophils Relative: 0 %
Eosinophils Absolute: 0.2 10*3/uL (ref 0.0–0.5)
Eosinophils Relative: 2 %
HCT: 34.6 % — ABNORMAL LOW (ref 39.0–52.0)
Hemoglobin: 11.9 g/dL — ABNORMAL LOW (ref 13.0–17.0)
Immature Granulocytes: 0 %
Lymphocytes Relative: 10 %
Lymphs Abs: 0.7 10*3/uL (ref 0.7–4.0)
MCH: 32.4 pg (ref 26.0–34.0)
MCHC: 34.4 g/dL (ref 30.0–36.0)
MCV: 94.3 fL (ref 80.0–100.0)
Monocytes Absolute: 0.6 10*3/uL (ref 0.1–1.0)
Monocytes Relative: 8 %
Neutro Abs: 5.7 10*3/uL (ref 1.7–7.7)
Neutrophils Relative %: 80 %
Platelets: 147 10*3/uL — ABNORMAL LOW (ref 150–400)
RBC: 3.67 MIL/uL — ABNORMAL LOW (ref 4.22–5.81)
RDW: 13.8 % (ref 11.5–15.5)
WBC: 7.1 10*3/uL (ref 4.0–10.5)
nRBC: 0 % (ref 0.0–0.2)

## 2021-08-29 LAB — MAGNESIUM: Magnesium: 1.8 mg/dL (ref 1.7–2.4)

## 2021-08-29 MED ORDER — CEFDINIR 300 MG PO CAPS
300.0000 mg | ORAL_CAPSULE | Freq: Two times a day (BID) | ORAL | Status: DC
  Administered 2021-08-29: 300 mg via ORAL
  Filled 2021-08-29 (×2): qty 1

## 2021-08-29 MED ORDER — IBUPROFEN 400 MG PO TABS: 400.0000 mg | ORAL_TABLET | Freq: Three times a day (TID) | ORAL | 0 refills | Status: AC

## 2021-08-29 NOTE — Progress Notes (Signed)
Patient was to be discharged on 08/28/2021 however was assessed by PT yesterday and patient noted to be complaining of significant right shoulder pain with 2 person assist and it was felt patient was unsafe to be discharged then.  Patient placed on ibuprofen scheduled with clinical improvement and reassessed by PT who feels patient is stable for discharge safely home with home health therapies. ?Patient was discharged home in stable condition. ?Outpatient follow-up with PCP. ? ?No charge. ?

## 2021-08-29 NOTE — Progress Notes (Signed)
Physical Therapy Treatment ?Patient Details ?Name: Eugene [name] ?MRN: [id number] ?DOB: Jul 11, 1935 ?Today's Date: 08/29/2021 ? ? ?History of Present Illness Eugene [name] is a 86 y.o. male who presents s/p witnessed seizure at home. PMH: hypertension, hyperlipidemia, atrial fibrillation, CVA, AAA s/p stenting, fall in 02/2021 sustaining fractured hip, s/p ORIF and rehab stay ? ?  ?PT Comments  ? ? Pt much improved from yesterday, denies R shoulder pain today now with c/o of L wrist pain. Pt now at minAx1 for transfers and ambulation with RW. Pt functioning near baseline and was able to tolerate 120' of amb with RW. From mobility stand point pt safe to d/c home with 24/7 assist and home health therapy services. Acute PT to cont to follow. ?   ?Recommendations for follow up therapy are one component of a multi-disciplinary discharge planning process, led by the attending physician.  Recommendations may be updated based on patient status, additional functional criteria and insurance authorization. ? ?Follow Up Recommendations ? Home health PT ?  ?  ?Assistance Recommended at Discharge Frequent or constant Supervision/Assistance  ?Patient can return home with the following A little help with walking and/or transfers;A little help with bathing/dressing/bathroom;Assistance with cooking/housework;Direct supervision/assist for medications management;Direct supervision/assist for financial management;Assist for transportation;Help with stairs or ramp for entrance ?  ?Equipment Recommendations ? None recommended by PT (has needed DME)  ?  ?Recommendations for Other Services   ? ? ?  ?Precautions / Restrictions Precautions ?Precautions: Fall ?Restrictions ?Weight Bearing Restrictions: No  ?  ? ?Mobility ? Bed Mobility ?Overal bed mobility: Needs Assistance ?Bed Mobility: Supine to Sit ?  ?  ?Supine to sit: Mod assist, HOB elevated ?  ?  ?General bed mobility comments: modA for trunk elevation, HOB elevated, increased  time ?  ? ?Transfers ?Overall transfer level: Needs assistance ?Equipment used: Rolling walker (2 wheels) ?Transfers: Sit to/from Stand, Bed to chair/wheelchair/BSC ?Sit to Stand: Min assist ?  ?Step pivot transfers: Min assist ?  ?  ?  ?General transfer comment: bed elevated, pt much improved from yesterday, verbal cues for hand placement, minA for forward/anterior weigh shift due to posterior bias ?  ? ?Ambulation/Gait ?Ambulation/Gait assistance: +2 safety/equipment, Min assist (2nd person for chair follow) ?Gait Distance (Feet): 120 Feet ?Assistive device: Rolling walker (2 wheels) ?Gait Pattern/deviations: Step-through pattern, Decreased stride length, Trunk flexed ?Gait velocity: slow ?Gait velocity interpretation: <1.8 ft/sec, indicate of risk for recurrent falls ?  ?General Gait Details: pt with decreased step height, 2 standing rest break, progressive trunk flexion, verbal cues to stand upright and stay in walker and not push to far out in front ? ? ?Stairs ?  ?  ?  ?  ?  ? ? ?Wheelchair Mobility ?  ? ?Modified Rankin (Stroke Patients Only) ?  ? ? ?  ?Balance Overall balance assessment: Needs assistance ?Sitting-balance support: No upper extremity supported, Feet supported ?Sitting balance-Leahy Scale: Fair ?  ?  ?Standing balance support: Bilateral upper extremity supported, During functional activity ?Standing balance-Leahy Scale: Poor ?Standing balance comment: dependent on RW ?  ?  ?  ?  ?  ?  ?  ?  ?  ?  ?  ?  ? ?  ?Cognition Arousal/Alertness: Awake/alert ?Behavior During Therapy: [facility] for tasks assessed/performed ?Overall Cognitive Status: Within Functional Limits for tasks assessed ?  ?  ?  ?  ?  ?  ?  ?  ?  ?  ?  ?  ?  ?  ?  ?  ?  General Comments: pt A&Ox4, followed commands ?  ?  ? ?  ?Exercises   ? ?  ?General Comments General comments (skin integrity, edema, etc.): VSS, assist to [facility], dependent for hygiene s/p BM ?  ?  ? ?Pertinent Vitals/Pain Pain Assessment ?Pain Assessment: Faces ?Faces Pain  Scale: Hurts little more ?Pain Location: L wrist ?Pain Descriptors / Indicators: Aching  ? ? ?Home Living   ?  ?  ?  ?  ?  ?  ?  ?  ?  ?   ?  ?Prior Function    ?  ?  ?   ? ?PT Goals (current goals can now be found in the care plan section) Acute Rehab PT Goals ?PT Goal Formulation: With patient/family ?Time For Goal Achievement: 09/09/21 ?Potential to Achieve Goals: Good ?Progress towards PT goals: Progressing toward goals ? ?  ?Frequency ? ? ? Min 3X/week ? ? ? ?  ?PT Plan Current plan remains appropriate  ? ? ?Co-evaluation   ?  ?  ?  ?  ? ?  ?AM-PAC PT "6 Clicks" Mobility   ?Outcome Measure ? Help needed turning from your back to your side while in a flat bed without using bedrails?: A Little ?Help needed moving from lying on your back to sitting on the side of a flat bed without using bedrails?: A Lot ?Help needed moving to and from a bed to a chair (including a wheelchair)?: A Little ?Help needed standing up from a chair using your arms (e.g., wheelchair or bedside chair)?: A Little ?Help needed to walk in hospital room?: A Little ?Help needed climbing 3-5 steps with a railing? : A Lot ?6 Click Score: 16 ? ?  ?End of Session Equipment Utilized During Treatment: Gait belt ?Activity Tolerance: Patient tolerated treatment well ?Patient left: in chair;with call bell/phone within reach;with chair alarm set ?Nurse Communication: Mobility status ?PT Visit Diagnosis: Unsteadiness on feet (R26.81);Muscle weakness (generalized) (M62.81);Difficulty in walking, not elsewhere classified (R26.2) ?  ? ? ?Time: [id number] ?PT Time Calculation (min) (ACUTE ONLY): 30 min ? ?Charges:  $Gait Training: 8-22 mins ?$Therapeutic Activity: 8-22 mins          ?          ? ?Kittie Plater, PT, DPT ?Acute Rehabilitation Services ?Secure chat preferred ?Office #: [phone] ? ? ? ?[name] ?08/29/2021, 10:52 AM ? ?

## 2021-08-29 NOTE — Assessment & Plan Note (Signed)
-   Patient was to be discharged on 08/28/2021 however after evaluation by physical therapy noted to have significant right shoulder pain limiting his ability to use it functionally.  Patient also noted to have some significant weakness and as such discharge canceled. ?-Patient placed on scheduled ibuprofen 400 mg 3 times daily with clinical improvement. ?-Patient reassessed by physical therapy and had noted significant improvement and deemed stable and safe to be discharged home with home health therapies. ?-Patient was discharged home on ibuprofen 400 mg 3 times daily x5 days. ?-Outpatient follow-up with PCP. ?

## 2021-08-29 NOTE — Progress Notes (Signed)
Pt asked for IV to be removed. IV site had old blood around it. Pt stated he wanted it out and that he was going home today so he didn't need one. IV removed per request no new IV placed.  ?

## 2021-08-29 NOTE — Care Management Important Message (Signed)
Important Message ? ?Patient Details  ?Name: Eugene [name] ?MRN: [id number] ?Date of Birth: Aug 15, 1935 ? ? ?Medicare Important Message Given:  Yes ? ? ? ? ?Jasyn Mey ?08/29/2021, 2:52 PM ?

## 2021-08-29 NOTE — TOC Transition Note (Signed)
Transition of Care (TOC) - CM/SW Discharge Note ? ? ?Patient Details  ?Name: Eugene [name] ?MRN: [id number] ?Date of Birth: 03/29/1936 ? ?Transition of Care (TOC) CM/SW Contact:  ?Kermit Balo, RN ?Phone Number: ?08/29/2021, 3:18 PM ? ? ?Clinical Narrative:    ?Discharging home today. Son in law at the bedside and is going to provide transport home.  ? ? ?Final next level of care: Home w Home Health Services ?Barriers to Discharge: No Barriers Identified ? ? ?Patient Goals and CMS Choice ?Patient states their goals for this hospitalization and ongoing recovery are:: to go home ?CMS Medicare.gov Compare Post Acute Care list provided to:: Patient ?Choice offered to / list presented to : Patient ? ?Discharge Placement ?  ?           ?  ?  ?  ?  ? ?Discharge Plan and Services ?  ?Discharge Planning Services: CM Consult ?Post Acute Care Choice: Home Health          ?  ?  ?  ?  ?  ?HH Arranged: PT, OT, Speech Therapy ?HH Agency: [facility] ?Date HH Agency Contacted: 08/28/21 ?Time HH Agency Contacted: [phone] ?Representative spoke with at [facility] Agency: Candise Bowens ? ?Social Determinants of Health (SDOH) Interventions ?  ? ? ?Readmission Risk Interventions ?   ? View : No data to display.  ?  ?  ?  ? ? ? ? ? ?

## 2021-08-29 NOTE — Plan of Care (Signed)
  Problem: Education: Goal: Knowledge of General Education information will improve Description: Including pain rating scale, medication(s)/side effects and non-pharmacologic comfort measures Outcome: Adequate for Discharge   Problem: Health Behavior/Discharge Planning: Goal: Ability to manage health-related needs will improve Outcome: Adequate for Discharge   Problem: Clinical Measurements: Goal: Ability to maintain clinical measurements within normal limits will improve Outcome: Adequate for Discharge Goal: Will remain free from infection Outcome: Adequate for Discharge Goal: Diagnostic test results will improve Outcome: Adequate for Discharge Goal: Respiratory complications will improve Outcome: Adequate for Discharge Goal: Cardiovascular complication will be avoided Outcome: Adequate for Discharge   Problem: Activity: Goal: Risk for activity intolerance will decrease Outcome: Adequate for Discharge   Problem: Nutrition: Goal: Adequate nutrition will be maintained Outcome: Adequate for Discharge   Problem: Coping: Goal: Level of anxiety will decrease Outcome: Adequate for Discharge   Problem: Elimination: Goal: Will not experience complications related to bowel motility Outcome: Adequate for Discharge Goal: Will not experience complications related to urinary retention Outcome: Adequate for Discharge   Problem: Pain Managment: Goal: General experience of comfort will improve Outcome: Adequate for Discharge   Problem: Safety: Goal: Ability to remain free from injury will improve Outcome: Adequate for Discharge   Problem: Skin Integrity: Goal: Risk for impaired skin integrity will decrease Outcome: Adequate for Discharge   Problem: Education: Goal: Knowledge of disease or condition will improve Outcome: Adequate for Discharge Goal: Knowledge of secondary prevention will improve (SELECT ALL) Outcome: Adequate for Discharge Goal: Knowledge of patient specific  risk factors will improve (INDIVIDUALIZE FOR PATIENT) Outcome: Adequate for Discharge   Problem: Coping: Goal: Will verbalize positive feelings about self Outcome: Adequate for Discharge Goal: Will identify appropriate support needs Outcome: Adequate for Discharge   Problem: Health Behavior/Discharge Planning: Goal: Ability to manage health-related needs will improve Outcome: Adequate for Discharge   Problem: Self-Care: Goal: Ability to participate in self-care as condition permits will improve Outcome: Adequate for Discharge Goal: Verbalization of feelings and concerns over difficulty with self-care will improve Outcome: Adequate for Discharge Goal: Ability to communicate needs accurately will improve Outcome: Adequate for Discharge   Problem: Nutrition: Goal: Risk of aspiration will decrease Outcome: Adequate for Discharge Goal: Dietary intake will improve Outcome: Adequate for Discharge   Problem: Ischemic Stroke/TIA Tissue Perfusion: Goal: Complications of ischemic stroke/TIA will be minimized Outcome: Adequate for Discharge   

## 2021-08-30 LAB — BLOOD CULTURE ID PANEL (REFLEXED) - BCID2

## 2021-08-31 LAB — CULTURE, BLOOD (ROUTINE X 2)
Culture: NO GROWTH
Culture: NO GROWTH
Special Requests: ADEQUATE
Special Requests: ADEQUATE

## 2021-09-02 LAB — CULTURE, BLOOD (ROUTINE X 2)
Culture: NO GROWTH
Special Requests: ADEQUATE

## 2021-09-06 ENCOUNTER — Inpatient Hospital Stay: Payer: Medicare Other

## 2021-09-13 ENCOUNTER — Inpatient Hospital Stay: Payer: Medicare Other | Admitting: Hematology and Oncology

## 2021-09-15 ENCOUNTER — Telehealth: Payer: Self-pay | Admitting: Cardiology

## 2021-09-15 MED ORDER — POTASSIUM CHLORIDE CRYS ER 20 MEQ PO TBCR: 20.0000 meq | EXTENDED_RELEASE_TABLET | Freq: Every day | ORAL | 0 refills | Status: DC

## 2021-09-15 NOTE — Telephone Encounter (Signed)
Spoke with pt and obtained permission to speak with daughter.  Advised daughter that Keppra is not a cardiac medication and they would need to speak with pt's neurologist, if he has one, or go back to PCP.  Daughter verbalized understanding.  ?

## 2021-09-15 NOTE — Telephone Encounter (Signed)
? ?*  STAT* If patient is at the pharmacy, call can be transferred to refill team. ? ? ?1. Which medications need to be refilled? (please list name of each medication and dose if known) potassium chloride SA (KLOR-CON M20) 20 MEQ tablet ? ?2. Which pharmacy/location (including street and city if local pharmacy) is medication to be sent to?[id number] - [address], [place] ? ?3. Do they need a 30 day or 90 day supply? 90 days ?

## 2021-09-15 NOTE — Telephone Encounter (Signed)
?  Pt c/o medication issue: ? ?1. Name of Medication: levETIRAcetam (KEPPRA) 500 MG tablet ? ?2. How are you currently taking this medication (dosage and times per day)? Take 1 tablet (500 mg total) by mouth 2 (two) times daily. ? ?3. Are you having a reaction (difficulty breathing--STAT)?  ? ?4. What is your medication issue? Pt's daughter calling, they wanted to ask Dr. Mayford Knife if pt needs to continue taking this meds. She said,pt saw pcp and was told to reach out to Dr. Mayford Knife about this meds ?

## 2021-09-15 NOTE — Telephone Encounter (Signed)
Pt's medication was sent to pt's pharmacy as requested. Confirmation received.  °

## 2021-10-23 ENCOUNTER — Other Ambulatory Visit: Payer: Self-pay | Admitting: Cardiology

## 2021-10-24 NOTE — Telephone Encounter (Signed)
Age 86, weight  94kg, SCr 1.25 on 08/2021, afib indication Has not been seen since 02/2020, overdue for follow up. Has f/u appt scheduled in July, will send in refill to last pt until this appt

## 2021-10-25 ENCOUNTER — Inpatient Hospital Stay: Payer: Medicare Other | Attending: Oncology

## 2021-10-25 ENCOUNTER — Other Ambulatory Visit: Payer: Self-pay

## 2021-10-25 DIAGNOSIS — Z79899 Other long term (current) drug therapy: Secondary | ICD-10-CM | POA: Diagnosis not present

## 2021-10-25 DIAGNOSIS — I4821 Permanent atrial fibrillation: Secondary | ICD-10-CM | POA: Insufficient documentation

## 2021-10-25 DIAGNOSIS — Z8673 Personal history of transient ischemic attack (TIA), and cerebral infarction without residual deficits: Secondary | ICD-10-CM | POA: Diagnosis not present

## 2021-10-25 DIAGNOSIS — I10 Essential (primary) hypertension: Secondary | ICD-10-CM | POA: Diagnosis not present

## 2021-10-25 DIAGNOSIS — Z7901 Long term (current) use of anticoagulants: Secondary | ICD-10-CM | POA: Diagnosis not present

## 2021-10-25 LAB — CBC WITH DIFFERENTIAL (CANCER CENTER ONLY)
Abs Immature Granulocytes: 0.02 10*3/uL (ref 0.00–0.07)
Basophils Absolute: 0 10*3/uL (ref 0.0–0.1)
Basophils Relative: 1 %
Eosinophils Absolute: 0.3 10*3/uL (ref 0.0–0.5)
Eosinophils Relative: 4 %
HCT: 37.3 % — ABNORMAL LOW (ref 39.0–52.0)
Hemoglobin: 12.9 g/dL — ABNORMAL LOW (ref 13.0–17.0)
Immature Granulocytes: 0 %
Lymphocytes Relative: 24 %
Lymphs Abs: 1.5 10*3/uL (ref 0.7–4.0)
MCH: 31.6 pg (ref 26.0–34.0)
MCHC: 34.6 g/dL (ref 30.0–36.0)
MCV: 91.4 fL (ref 80.0–100.0)
Monocytes Absolute: 0.6 10*3/uL (ref 0.1–1.0)
Monocytes Relative: 10 %
Neutro Abs: 4 10*3/uL (ref 1.7–7.7)
Neutrophils Relative %: 61 %
Platelet Count: 196 10*3/uL (ref 150–400)
RBC: 4.08 MIL/uL — ABNORMAL LOW (ref 4.22–5.81)
RDW: 13.9 % (ref 11.5–15.5)
WBC Count: 6.4 10*3/uL (ref 4.0–10.5)
nRBC: 0 % (ref 0.0–0.2)

## 2021-10-25 LAB — IRON AND IRON BINDING CAPACITY (CC-WL,HP ONLY)
Iron: 73 ug/dL (ref 45–182)
Saturation Ratios: 28 % (ref 17.9–39.5)
TIBC: 265 ug/dL (ref 250–450)
UIBC: 192 ug/dL

## 2021-10-25 LAB — CMP (CANCER CENTER ONLY)
ALT: 16 U/L (ref 0–44)
AST: 19 U/L (ref 15–41)
Albumin: 4.1 g/dL (ref 3.5–5.0)
Alkaline Phosphatase: 71 U/L (ref 38–126)
Anion gap: 7 (ref 5–15)
BUN: 20 mg/dL (ref 8–23)
CO2: 30 mmol/L (ref 22–32)
Calcium: 9.7 mg/dL (ref 8.9–10.3)
Chloride: 96 mmol/L — ABNORMAL LOW (ref 98–111)
Creatinine: 1.32 mg/dL — ABNORMAL HIGH (ref 0.61–1.24)
GFR, Estimated: 53 mL/min — ABNORMAL LOW (ref >60)
Glucose, Bld: 86 mg/dL (ref 70–99)
Potassium: 3.8 mmol/L (ref 3.5–5.1)
Sodium: 133 mmol/L — ABNORMAL LOW (ref 135–145)
Total Bilirubin: 1 mg/dL (ref 0.3–1.2)
Total Protein: 6.9 g/dL (ref 6.5–8.1)

## 2021-10-25 LAB — FERRITIN: Ferritin: 55 ng/mL (ref 24–336)

## 2021-11-01 ENCOUNTER — Inpatient Hospital Stay: Payer: Medicare Other | Attending: Hematology and Oncology | Admitting: Hematology and Oncology

## 2021-11-01 ENCOUNTER — Other Ambulatory Visit: Payer: Self-pay

## 2021-11-01 DIAGNOSIS — Z79899 Other long term (current) drug therapy: Secondary | ICD-10-CM | POA: Diagnosis not present

## 2021-11-01 NOTE — Progress Notes (Signed)
[place] [phone]   [phone]  PROGRESS NOTE  Patient Care Team: [name], MD as PCP - General (Internal Medicine) Magrinat, Virgie Dad, MD (Inactive) (Hematology and Oncology) Sueanne Margarita, MD as Attending Physician (Cardiology) Darleen Crocker, MD as Consulting Physician (Ophthalmology) Juanita Craver, MD as Consulting Physician (Gastroenterology) Serafina Mitchell, MD as Consulting Physician (Vascular Surgery)  Hematological/Oncological History # Hemochromatosis 1989: diagnosed with Hereditary Hemochromatosis (reportedly) 11/01/2021: establish care with Dr. Lorenso Courier   Interval History:  Eugene [name] 86 y.o. male with medical history significant for hereditary hemochromatosis presents for a follow up visit. The patient's last visit was on 02/15/2021 with [name]. In the interim since the last visit he has had a fall in October 2022 resulting in a hip fracture and prolonged hospitalization at [facility].  On exam today Eugene [name] notes that he has been well in the interim since his last visit.  He reports that his energy levels are low though he is able to do all the things he enjoys doing.  He does enjoy playing golf but is mostly not been able to do this due to back pain.  He also reports that he used to enjoy fishing but now he spends most of his time playing the piano.  He reports his appetite is good though his weight has been declining.  He reports that he does have 24-hour care at home after his hip fracture.  He is able to walk with a walker.  He notes that he is not having any issues with bleeding or bruising.  He last underwent phlebotomy in March 2022.  He currently denies any fevers, chills, sweats, nausea, vomiting or diarrhea.  Full 10 point ROS is listed below.  MEDICAL HISTORY:  Past Medical History:  Diagnosis Date   AAA (abdominal aortic aneurysm) 06/29/2007   stent graft   Aortic stenosis 09/05/2017   Mild AS/AR by  echo 01/2017.   GERD (gastroesophageal reflux disease)    Hemochromatosis    Hyperlipidemia    Hypertension    Irritable bowel syndrome    Permanent atrial fibrillation (HCC)    Stroke (HCC)    Transient Ischemic Attack    SURGICAL HISTORY: Past Surgical History:  Procedure Laterality Date   ABDOMINAL AORTIC ANEURYSM REPAIR     ENDOVASCULAR STENT INSERTION  07/18/2007   saccular infrarenal aortic aneurysm   EYE SURGERY Bilateral    Cataract     IR FLUORO GUIDE CV LINE LEFT  12/08/2018   IR FLUORO GUIDE CV LINE RIGHT  02/13/2017   IR FLUORO GUIDE CV LINE RIGHT  02/25/2017   IR US GUIDE VASC ACCESS RIGHT  02/13/2017   PICC LINE PLACE PERIPHERAL ([place] HX)  Oct 21, 2014    SOCIAL HISTORY: Social History   Socioeconomic History   Marital status: Married    Spouse name: Not on file   Number of children: Not on file   Years of education: Not on file   Highest education level: Not on file  Occupational History   Not on file  Tobacco Use   Smoking status: Former    Types: Cigarettes    Quit date: 06/18/1999    Years since quitting: 22.4   Smokeless tobacco: Never  Substance and Sexual Activity   Alcohol use: Yes    Alcohol/week: 1.0 - 2.0 standard drink of alcohol    Types: 1 - 2 Standard drinks or equivalent per week   Drug use: No  Sexual activity: Not on file  Other Topics Concern   Not on file  Social History Narrative   Not on file   Social Determinants of Health   Financial Resource Strain: Not on file  Food Insecurity: Not on file  Transportation Needs: Not on file  Physical Activity: Not on file  Stress: Not on file  Social Connections: Not on file  Intimate Partner Violence: Not on file    FAMILY HISTORY: Family History  Problem Relation Age of Onset   Hypertension Mother    Heart disease Mother        AAA, he passed in his 59 s of Heart Diseasse   Heart disease Father        AAA history and he passed at age 36  of   Disease    ALLERGIES:  is  allergic to chlorhexidine gluconate, macrolides and ketolides, and tape.  MEDICATIONS:  Current Outpatient Medications  Medication Sig Dispense Refill   acetaminophen (TYLENOL) 325 MG tablet Take 2 tablets (650 mg total) by mouth every 6 (six) hours as needed for mild pain (or Fever >/= 101).     ALPRAZolam (XANAX) 1 MG tablet Take 0.5 tablets (0.5 mg total) by mouth 2 (two) times daily. For anxiety 14 tablet 0   apixaban (ELIQUIS) 5 MG TABS tablet TAKE 1 TABLET BY MOUTH TWICE A DAY 60 tablet 2   Ascorbic Acid (VITAMIN C) 100 MG tablet Take 1,000 mg by mouth daily.     atorvastatin (LIPITOR) 20 MG tablet Take 20 mg by mouth daily.     Cholecalciferol (VITAMIN D3) 25 MCG (1000 UT) CAPS Take 1,000 Units by mouth daily.     fluticasone (FLONASE) 50 MCG/ACT nasal spray Place 2 sprays into both nostrils daily as needed for allergies or rhinitis.     furosemide (LASIX) 40 MG tablet Take 1 tablet (40 mg total) by mouth daily. 30 tablet    hyoscyamine (LEVBID) 0.375 MG 12 hr tablet Take 0.375 mg by mouth daily as needed for cramping.     levETIRAcetam (KEPPRA) 500 MG tablet Take 1 tablet (500 mg total) by mouth 2 (two) times daily. 60 tablet 1   Loperamide HCl (IMODIUM PO) Take 4 mg by mouth daily as needed (loose stool).     metoprolol tartrate (LOPRESSOR) 25 MG tablet Take 25 mg by mouth 2 (two) times daily.      omeprazole (PRILOSEC OTC) 20 MG tablet Take 20 mg by mouth daily.     potassium chloride SA (KLOR-CON M20) 20 MEQ tablet Take 1 tablet (20 mEq total) by mouth daily. 90 tablet 0   QUEtiapine (SEROQUEL) 25 MG tablet Take 25 mg by mouth at bedtime.     ramipril (ALTACE) 5 MG capsule Take 1 capsule (5 mg total) by mouth daily.     tamsulosin (FLOMAX) 0.4 MG CAPS capsule Take 0.4 mg by mouth at bedtime.     Thiamine HCl (VITAMIN B-1 PO) Take 250 mg by mouth daily.     tiZANidine (ZANAFLEX) 2 MG tablet Take 2 mg by mouth at bedtime as needed for muscle spasms.     traZODone (DESYREL) 50 MG  tablet Take 50 mg by mouth at bedtime as needed for sleep.     Vitamin E 400 units TABS Take 400 Units by mouth daily.     No current facility-administered medications for this visit.   Facility-Administered Medications Ordered in Other Visits  Medication Dose Route Frequency Provider Last Rate Last Admin  alteplase (CATHFLO ACTIVASE) injection 2 mg  2 mg Intracatheter Once PRN Magrinat, Virgie Dad, MD       sodium chloride 0.9 % injection 10 mL  10 mL Intravenous PRN Magrinat, Virgie Dad, MD   10 mL at 11/25/14 1220    REVIEW OF SYSTEMS:   Constitutional: ( - ) fevers, ( - )  chills , ( - ) night sweats Eyes: ( - ) blurriness of vision, ( - ) double vision, ( - ) watery eyes Ears, nose, mouth, throat, and face: ( - ) mucositis, ( - ) sore throat Respiratory: ( - ) cough, ( - ) dyspnea, ( - ) wheezes Cardiovascular: ( - ) palpitation, ( - ) chest discomfort, ( - ) lower extremity swelling Gastrointestinal:  ( - ) nausea, ( - ) heartburn, ( - ) change in bowel habits Skin: ( - ) abnormal skin rashes Lymphatics: ( - ) new lymphadenopathy, ( - ) easy bruising Neurological: ( - ) numbness, ( - ) tingling, ( - ) new weaknesses Behavioral/Psych: ( - ) mood change, ( - ) new changes  All other systems were reviewed with the patient and are negative.  PHYSICAL EXAMINATION:  Vitals:   11/01/21 1204  BP: 128/70  Pulse: 85  Resp: 17  Temp: 97.6 F (36.4 C)  SpO2: 96%   Filed Weights   11/01/21 1204  Weight: 181 lb 14.4 oz (82.5 kg)    GENERAL: Well-appearing elderly Caucasian male, alert, no distress and comfortable SKIN: skin color, texture, turgor are normal, no rashes or significant lesions EYES: conjunctiva are pink and non-injected, sclera clear LUNGS: clear to auscultation and percussion with normal breathing effort HEART: regular rate & rhythm and no murmurs and no lower extremity edema Musculoskeletal: no cyanosis of digits and no clubbing  PSYCH: alert & oriented x 3, fluent  speech NEURO: no focal motor/sensory deficits  LABORATORY DATA:  I have reviewed the data as listed    Latest Ref Rng & Units 10/25/2021   12:47 PM 08/29/2021    9:47 AM 08/28/2021    3:37 AM  CBC  WBC 4.0 - 10.5 K/uL 6.4  7.1  7.5   Hemoglobin 13.0 - 17.0 g/dL 12.9  11.9  11.8   Hematocrit 39.0 - 52.0 % 37.3  34.6  35.2   Platelets 150 - 400 K/uL 196  147  149        Latest Ref Rng & Units 10/25/2021   12:47 PM 08/29/2021    9:47 AM 08/28/2021    3:37 AM  CMP  Glucose 70 - 99 mg/dL 86  125  89   BUN 8 - 23 mg/dL 20  17  16    Creatinine 0.61 - 1.24 mg/dL 1.32  1.25  1.25   Sodium 135 - 145 mmol/L 133  134  134   Potassium 3.5 - 5.1 mmol/L 3.8  3.6  3.8   Chloride 98 - 111 mmol/L 96  102  106   CO2 22 - 32 mmol/L 30  23  21    Calcium 8.9 - 10.3 mg/dL 9.7  8.7  8.8   Total Protein 6.5 - 8.1 g/dL 6.9     Total Bilirubin 0.3 - 1.2 mg/dL 1.0     Alkaline Phos 38 - 126 U/L 71     AST 15 - 41 U/L 19     ALT 0 - 44 U/L 16       RADIOGRAPHIC STUDIES: No results found.  ASSESSMENT &  PLAN Eugene [name] 86 y.o. male with medical history significant for hereditary hemochromatosis presents for a follow up visit.  After review the labs, review the records, discussion with the patient the findings most consistent with hereditary hemochromatosis.  The patient was diagnosed in 1988 and unfortunately we do not have records of the original genetic testing.  He was previously followed by [name] who passed on the care of this patient to me.  Patient has not required phlebotomy since March 2022.  At this time ferritin levels appear at target at 55.  Would recommend consideration of phlebotomies if levels reach greater than 150.  Patient voiced understanding of the plan moving forward.  We will see him back in 6 months time.  #Hemochromatosis --labs last drawn on 10/25/2021 show Ferritin 55, Hgb 12.9, WBC 6.4, Plt 196. TIBC 265, Sat 28% --last phlebotomy in March 2022 --recommend monitoring  blood work q 6 months. Next visit in Dec 2023 --restart phlebotomies if Ferritin is >150 --genetic testing originally done in 1988, records no available. May need to repeat this testing to have it on file. --RTC in 6 months time.   No orders of the defined types were placed in this encounter.   All questions were answered. The patient knows to call the clinic with any problems, questions or concerns.  A total of more than 40 minutes were spent on this encounter with face-to-face time and non-face-to-face time, including preparing to see the patient, ordering tests and/or medications, counseling the patient and coordination of care as outlined above.   Ledell Peoples, MD Department of Hematology/Oncology [place] [facility] Phone: [phone] Pager: [phone] Email: [email or web address]  11/07/2021 9:40 AM

## 2021-11-07 ENCOUNTER — Encounter: Payer: Self-pay | Admitting: Oncology

## 2021-12-13 ENCOUNTER — Other Ambulatory Visit: Payer: Self-pay | Admitting: Cardiology

## 2021-12-13 MED ORDER — POTASSIUM CHLORIDE CRYS ER 20 MEQ PO TBCR: 20.0000 meq | EXTENDED_RELEASE_TABLET | Freq: Every day | ORAL | 0 refills | Status: DC

## 2021-12-14 ENCOUNTER — Ambulatory Visit: Payer: Medicare Other | Admitting: Cardiology

## 2021-12-18 NOTE — Progress Notes (Unsigned)
Office Visit    Patient Name: Eugene [name] Date of Encounter: 12/19/2021  PCP:  Eugene Housekeeper, MD    [facility]  Cardiologist:  Eugene Magic, MD  Advanced Practice Provider:  No care team member to display Electrophysiologist:  None   Chief Complaint    Eugene [name] is a 86 y.o. male with a past medical history significant for hypertension, permanent atrial fibrillation status post failed DCCV x2 on chronic anticoagulation, hyperlipidemia, AAA with stent graft in place, aortic stenosis, and stroke presents today for overdue follow-up visit.  He was last seen by Eugene [name] 02/2020.  He was doing well at that time.  He denied any chest pain/pressure, SOB, DOE, PND, orthopnea, LE edema, dizziness, palpitations or syncope.  He was compliant and tolerating all his medications.  Today, he feels okay.  He had a seizure back in March and was trying to get in with Eugene [name].  He stays in atrial fibrillation and anticoagulated on Eliquis.  After his seizure he was started on Keppra.  He has not had any other episodes.  He did have a few falls but they were balance related.  He did not have any symptoms times prior to the falls such as palpitation, lightheadedness, chest pain, or shortness of breath.  He does drink plenty of water.  His blood pressure stays on the low side.  His ramipril was recently discontinued by his primary.  I agree with this.  His EKG shows rate controlled atrial fibrillation with a rate of 87 bpm.  He does not have any symptoms with his A-fib.  An echocardiogram was performed back in March that showed moderate tricuspid valve regurgitation and mild aortic valve stenosis.  Reports no shortness of breath nor dyspnea on exertion. Reports no chest pain, pressure, or tightness. No edema, orthopnea, PND. Reports no palpitations.    Past Medical History    Past Medical History:  Diagnosis Date   AAA (abdominal aortic aneurysm) (HCC) 06/29/2007    stent graft   Aortic stenosis 09/05/2017   Mild AS/AR by echo 01/2017.   GERD (gastroesophageal reflux disease)    Hemochromatosis    Hyperlipidemia    Hypertension    Irritable bowel syndrome    Permanent atrial fibrillation (HCC)    Stroke (HCC)    Transient Ischemic Attack   Past Surgical History:  Procedure Laterality Date   ABDOMINAL AORTIC ANEURYSM REPAIR     ENDOVASCULAR STENT INSERTION  07/18/2007   saccular infrarenal aortic aneurysm   EYE SURGERY Bilateral    Cataract     IR FLUORO GUIDE CV LINE LEFT  12/08/2018   IR FLUORO GUIDE CV LINE RIGHT  02/13/2017   IR FLUORO GUIDE CV LINE RIGHT  02/25/2017   IR US GUIDE VASC ACCESS RIGHT  02/13/2017   PICC LINE PLACE PERIPHERAL ([facility] HX)  Oct 21, 2014    Allergies  Allergies  Allergen Reactions   Chlorhexidine Gluconate Itching    Don't use Chloraprep inside port a cath kits.   Macrolides And Ketolides Nausea And Vomiting   Tape Other (See Comments)    Adhesive tape,   Only use paper tape     EKGs/Labs/Other Studies Reviewed:   The following studies were reviewed today:  Echocardiogram 08/25/2021 IMPRESSIONS     1. Left ventricular ejection fraction, by estimation, is 50 to 55%. The  left ventricle has low normal function. The left ventricle has no regional  wall motion abnormalities. There is  mild left ventricular hypertrophy.  Left ventricular diastolic function   could not be evaluated.   2. Right ventricular systolic function is mildly reduced. The right  ventricular size is mildly enlarged. There is severely elevated pulmonary  artery systolic pressure. The estimated right ventricular systolic  pressure is 61.3 mmHg.   3. Left atrial size was moderately dilated.   4. Right atrial size was moderately dilated.   5. The mitral valve is grossly normal. Trivial mitral valve  regurgitation. No evidence of mitral stenosis.   6. Tricuspid valve regurgitation is moderate.   7. The aortic valve is grossly normal.  There is moderate calcification of  the aortic valve. There is mild thickening of the aortic valve. Aortic  valve regurgitation is trivial. Mild aortic valve stenosis.   8. The inferior vena cava is dilated in size with >50% respiratory  variability, suggesting right atrial pressure of 8 mmHg.   Comparison(s): Changes from prior study are noted. PASP elevated compared  to prior, RV function mildly reduced.   EKG:  EKG is  ordered today.  The ekg ordered today demonstrates rate controlled atrial fibrillation, rate 87 bpm  Recent Labs: 04/01/2021: B Natriuretic Peptide 334.2 08/25/2021: TSH 0.925 08/29/2021: Magnesium 1.8 10/25/2021: ALT 16; BUN 20; Creatinine 1.32; Hemoglobin 12.9; Platelet Count 196; Potassium 3.8; Sodium 133  Recent Lipid Panel No results found for: "CHOL", "TRIG", "HDL", "CHOLHDL", "VLDL", "LDLCALC", "LDLDIRECT"  Risk Assessment/Calculations:   CHA2DS2-VASc Score = 6   This indicates a 9.7% annual risk of stroke. The patient's score is based upon: CHF History: 0 HTN History: 1 Diabetes History: 0 Stroke History: 2 Vascular Disease History: 1 Age Score: 2 Gender Score: 0     Home Medications   Current Meds  Medication Sig   acetaminophen (TYLENOL) 325 MG tablet Take 2 tablets (650 mg total) by mouth every 6 (six) hours as needed for mild pain (or Fever >/= 101).   ALPRAZolam (XANAX) 1 MG tablet Take 0.5 tablets (0.5 mg total) by mouth 2 (two) times daily. For anxiety   Ascorbic Acid (VITAMIN C) 100 MG tablet Take 1,000 mg by mouth daily.   Cholecalciferol (VITAMIN D3) 25 MCG (1000 UT) CAPS Take 1,000 Units by mouth daily.   fluticasone (FLONASE) 50 MCG/ACT nasal spray Place 2 sprays into both nostrils daily as needed for allergies or rhinitis.   hyoscyamine (LEVBID) 0.375 MG 12 hr tablet Take 0.375 mg by mouth daily as needed for cramping.   levETIRAcetam (KEPPRA) 500 MG tablet Take 1 tablet (500 mg total) by mouth 2 (two) times daily.   Loperamide HCl  (IMODIUM PO) Take 4 mg by mouth daily as needed (loose stool).   QUEtiapine (SEROQUEL) 25 MG tablet Take 25 mg by mouth at bedtime.   tamsulosin (FLOMAX) 0.4 MG CAPS capsule Take 0.4 mg by mouth at bedtime.   Thiamine HCl (VITAMIN B-1 PO) Take 250 mg by mouth daily.   tiZANidine (ZANAFLEX) 2 MG tablet Take 2 mg by mouth at bedtime as needed for muscle spasms.   traZODone (DESYREL) 50 MG tablet Take 50 mg by mouth at bedtime as needed for sleep.   Vitamin E 400 units TABS Take 400 Units by mouth daily.   [DISCONTINUED] apixaban (ELIQUIS) 5 MG TABS tablet TAKE 1 TABLET BY MOUTH TWICE A DAY   [DISCONTINUED] atorvastatin (LIPITOR) 20 MG tablet Take 20 mg by mouth daily.   [DISCONTINUED] furosemide (LASIX) 40 MG tablet Take 1 tablet (40 mg total) by mouth daily.   [  DISCONTINUED] metoprolol tartrate (LOPRESSOR) 25 MG tablet Take 25 mg by mouth 2 (two) times daily.    [DISCONTINUED] omeprazole (PRILOSEC OTC) 20 MG tablet Take 20 mg by mouth daily.   [DISCONTINUED] potassium chloride SA (KLOR-CON M20) 20 MEQ tablet Take 1 tablet (20 mEq total) by mouth daily.   [DISCONTINUED] ramipril (ALTACE) 5 MG capsule Take 1 capsule (5 mg total) by mouth daily.     Review of Systems      All other systems reviewed and are otherwise negative except as noted above.  Physical Exam    VS:  BP 94/60   Pulse 87   Ht 6' (1.829 m)   Wt 185 lb (83.9 kg)   SpO2 93%   BMI 25.09 kg/m  , BMI Body mass index is 25.09 kg/m.  Wt Readings from Last 3 Encounters:  12/19/21 185 lb (83.9 kg)  11/01/21 181 lb 14.4 oz (82.5 kg)  08/27/21 208 lb 1.8 oz (94.4 kg)     GEN: Well nourished, well developed, in no acute distress. HEENT: normal. Neck: Supple, no JVD, carotid bruits, or masses. Cardiac: RRR, no murmurs, rubs, or gallops. No clubbing, cyanosis, edema.  Radials/PT 2+ and equal bilaterally.  Respiratory:  Respirations regular and unlabored, clear to auscultation bilaterally. GI: Soft, nontender,  nondistended. MS: No deformity or atrophy. Skin: Warm and dry, no rash. Neuro:  Strength and sensation are intact. Psych: Normal affect.  Assessment & Plan    Permanent atrial fibrillation -Anticoagulation with Eliquis -Rate controlled today and asymptomatic -Continue metoprolol 25 mg twice a day  Syncope with seizure like activity -Recently started on Keppra 500 twice daily with improvement -No cardiac symptoms prior to syncopal event -Echocardiogram did not show any significant valvular issues -We will order carotid ultrasound -Recent labs already done by PCP  Hypertension -Hypotension, ramipril recently discontinued by PCP -Blood pressure 94/60 today but asymptomatic -Asked him to take his blood pressure at home daily for the next several days and let us know if it remains low  Aortic stenosis/regurgitation (mild) -Asymptomatic at this time, continue surveillance with annual echocardiogram  Tricuspid regurgitation (moderate) -Asymptomatic, annual echocardiogram         Disposition: Follow up 3 months with Eugene Magic, MD or APP.  Signed, Sharlene Dory, PA-C 12/19/2021, 4:50 PM [facility]

## 2021-12-19 ENCOUNTER — Ambulatory Visit (INDEPENDENT_AMBULATORY_CARE_PROVIDER_SITE_OTHER): Payer: Medicare Other | Admitting: Physician Assistant

## 2021-12-19 ENCOUNTER — Other Ambulatory Visit: Payer: Self-pay | Admitting: Physician Assistant

## 2021-12-19 ENCOUNTER — Encounter: Payer: Self-pay | Admitting: Physician Assistant

## 2021-12-19 VITALS — BP 94/60 | HR 87 | Ht 72.0 in | Wt 185.0 lb

## 2021-12-19 DIAGNOSIS — I071 Rheumatic tricuspid insufficiency: Secondary | ICD-10-CM | POA: Diagnosis not present

## 2021-12-19 DIAGNOSIS — I4821 Permanent atrial fibrillation: Secondary | ICD-10-CM | POA: Diagnosis not present

## 2021-12-19 DIAGNOSIS — I10 Essential (primary) hypertension: Secondary | ICD-10-CM

## 2021-12-19 DIAGNOSIS — Z8673 Personal history of transient ischemic attack (TIA), and cerebral infarction without residual deficits: Secondary | ICD-10-CM

## 2021-12-19 DIAGNOSIS — I350 Nonrheumatic aortic (valve) stenosis: Secondary | ICD-10-CM

## 2021-12-19 DIAGNOSIS — R569 Unspecified convulsions: Secondary | ICD-10-CM

## 2021-12-19 MED ORDER — OMEPRAZOLE MAGNESIUM 20 MG PO TBEC: 20.0000 mg | DELAYED_RELEASE_TABLET | Freq: Every day | ORAL | 3 refills | Status: DC

## 2021-12-19 MED ORDER — ATORVASTATIN CALCIUM 20 MG PO TABS
20.0000 mg | ORAL_TABLET | Freq: Every day | ORAL | 3 refills | Status: DC
Start: 2021-12-19 — End: 2022-05-10

## 2021-12-19 MED ORDER — FUROSEMIDE 40 MG PO TABS: 40.0000 mg | ORAL_TABLET | Freq: Every day | ORAL | 3 refills | Status: DC

## 2021-12-19 MED ORDER — POTASSIUM CHLORIDE ER 10 MEQ PO TBCR: 10.0000 meq | EXTENDED_RELEASE_TABLET | Freq: Every day | ORAL | 3 refills | Status: DC

## 2021-12-19 MED ORDER — METOPROLOL TARTRATE 25 MG PO TABS
25.0000 mg | ORAL_TABLET | Freq: Two times a day (BID) | ORAL | 3 refills | Status: DC
Start: 2021-12-19 — End: 2022-01-26

## 2021-12-19 MED ORDER — APIXABAN 5 MG PO TABS: 5.0000 mg | ORAL_TABLET | Freq: Two times a day (BID) | ORAL | 2 refills | Status: DC

## 2021-12-19 NOTE — Patient Instructions (Addendum)
Medication Instructions:   DISCONTINUE Rampril   *If you need a refill on your cardiac medications before your next appointment, please call your pharmacy*   Lab Work:  The day you see Dr. Mayford Knife in November eat light breakfast and skip lunch.  If you have labs (blood work) drawn today and your tests are completely normal, you will receive your results only by: MyChart Message (if you have MyChart) OR A paper copy in the mail If you have any lab test that is abnormal or we need to change your treatment, we will call you to review the results.   Testing/Procedures:  Your physician has requested that you have a carotid duplex. This test is an ultrasound of the carotid arteries in your neck. It looks at blood flow through these arteries that supply the brain with blood. Allow one hour for this exam. There are no restrictions or special instructions. [place],Tuesday, August 8 @ 2:00 pm.     Follow-Up: At [facility], you and your health needs are our priority.  As part of our continuing mission to provide you with exceptional heart care, we have created designated Provider Care Teams.  These Care Teams include your primary Cardiologist (physician) and Advanced Practice Providers (APPs -  Physician Assistants and Nurse Practitioners) who all work together to provide you with the care you need, when you need it.  We recommend signing up for the patient portal called "MyChart".  Sign up information is provided on this After Visit Summary.  MyChart is used to connect with patients for Virtual Visits (Telemedicine).  Patients are able to view lab/test results, encounter notes, upcoming appointments, etc.  Non-urgent messages can be sent to your provider as well.   To learn more about what you can do with MyChart, go to [email or web address].    Your next appointment:   4 month(s)  The format for your next appointment:   In Person  Provider:   Armanda Magic, MD     Other  Instructions  HOW TO TAKE YOUR BLOOD PRESSURE: Rest 5 minutes before taking your blood pressure.  Don't smoke or drink caffeinated beverages for at least 30 minutes before. Take your blood pressure before (not after) you eat. Sit comfortably with your back supported and both feet on the floor (don't cross your legs). Elevate your arm to heart level on a table or a desk. Use the proper sized cuff. It should fit smoothly and snugly around your bare upper arm. There should be enough room to slip a fingertip under the cuff. The bottom edge of the cuff should be 1 inch above the crease of the elbow.  Please call office @ [phone]  to give Blood Pressure readings in 1-2 weeks.   Important Information About Sugar

## 2022-01-02 ENCOUNTER — Ambulatory Visit (HOSPITAL_COMMUNITY): Admission: RE | Admit: 2022-01-02 | Payer: Medicare Other | Source: Ambulatory Visit

## 2022-01-11 ENCOUNTER — Inpatient Hospital Stay (HOSPITAL_COMMUNITY)
Admission: EM | Admit: 2022-01-11 | Discharge: 2022-01-17 | DRG: 199 | Disposition: A | Discharge status: Rehabilitation Facility | Payer: Medicare Other | Attending: Internal Medicine | Admitting: Internal Medicine

## 2022-01-11 ENCOUNTER — Other Ambulatory Visit: Payer: Self-pay

## 2022-01-11 ENCOUNTER — Encounter (HOSPITAL_COMMUNITY): Payer: Self-pay | Admitting: Emergency Medicine

## 2022-01-11 ENCOUNTER — Emergency Department (HOSPITAL_COMMUNITY): Payer: Medicare Other

## 2022-01-11 ENCOUNTER — Inpatient Hospital Stay (HOSPITAL_COMMUNITY): Payer: Medicare Other

## 2022-01-11 DIAGNOSIS — R509 Fever, unspecified: Secondary | ICD-10-CM | POA: Diagnosis not present

## 2022-01-11 DIAGNOSIS — Z8679 Personal history of other diseases of the circulatory system: Secondary | ICD-10-CM | POA: Diagnosis not present

## 2022-01-11 DIAGNOSIS — W19XXXA Unspecified fall, initial encounter: Secondary | ICD-10-CM | POA: Diagnosis present

## 2022-01-11 DIAGNOSIS — J939 Pneumothorax, unspecified: Secondary | ICD-10-CM | POA: Diagnosis present

## 2022-01-11 DIAGNOSIS — J449 Chronic obstructive pulmonary disease, unspecified: Secondary | ICD-10-CM | POA: Diagnosis present

## 2022-01-11 DIAGNOSIS — I4811 Longstanding persistent atrial fibrillation: Secondary | ICD-10-CM | POA: Diagnosis not present

## 2022-01-11 DIAGNOSIS — D62 Acute posthemorrhagic anemia: Secondary | ICD-10-CM | POA: Diagnosis present

## 2022-01-11 DIAGNOSIS — Z6825 Body mass index (BMI) 25.0-25.9, adult: Secondary | ICD-10-CM

## 2022-01-11 DIAGNOSIS — Z4682 Encounter for fitting and adjustment of non-vascular catheter: Secondary | ICD-10-CM | POA: Diagnosis not present

## 2022-01-11 DIAGNOSIS — N400 Enlarged prostate without lower urinary tract symptoms: Secondary | ICD-10-CM | POA: Diagnosis present

## 2022-01-11 DIAGNOSIS — E785 Hyperlipidemia, unspecified: Secondary | ICD-10-CM | POA: Diagnosis present

## 2022-01-11 DIAGNOSIS — J942 Hemothorax: Secondary | ICD-10-CM | POA: Diagnosis not present

## 2022-01-11 DIAGNOSIS — N170 Acute kidney failure with tubular necrosis: Secondary | ICD-10-CM | POA: Diagnosis present

## 2022-01-11 DIAGNOSIS — I110 Hypertensive heart disease with heart failure: Secondary | ICD-10-CM | POA: Diagnosis present

## 2022-01-11 DIAGNOSIS — E876 Hypokalemia: Secondary | ICD-10-CM | POA: Diagnosis not present

## 2022-01-11 DIAGNOSIS — F32A Depression, unspecified: Secondary | ICD-10-CM | POA: Diagnosis present

## 2022-01-11 DIAGNOSIS — I959 Hypotension, unspecified: Secondary | ICD-10-CM | POA: Diagnosis present

## 2022-01-11 DIAGNOSIS — S271XXA Traumatic hemothorax, initial encounter: Principal | ICD-10-CM | POA: Diagnosis present

## 2022-01-11 DIAGNOSIS — I4891 Unspecified atrial fibrillation: Secondary | ICD-10-CM | POA: Diagnosis not present

## 2022-01-11 DIAGNOSIS — F329 Major depressive disorder, single episode, unspecified: Secondary | ICD-10-CM | POA: Diagnosis present

## 2022-01-11 DIAGNOSIS — E440 Moderate protein-calorie malnutrition: Secondary | ICD-10-CM | POA: Diagnosis present

## 2022-01-11 DIAGNOSIS — R7989 Other specified abnormal findings of blood chemistry: Secondary | ICD-10-CM | POA: Diagnosis present

## 2022-01-11 DIAGNOSIS — G40909 Epilepsy, unspecified, not intractable, without status epilepticus: Secondary | ICD-10-CM | POA: Diagnosis present

## 2022-01-11 DIAGNOSIS — J9601 Acute respiratory failure with hypoxia: Secondary | ICD-10-CM | POA: Diagnosis present

## 2022-01-11 DIAGNOSIS — I4821 Permanent atrial fibrillation: Secondary | ICD-10-CM | POA: Diagnosis present

## 2022-01-11 DIAGNOSIS — N1831 Chronic kidney disease, stage 3a: Secondary | ICD-10-CM | POA: Diagnosis not present

## 2022-01-11 DIAGNOSIS — J90 Pleural effusion, not elsewhere classified: Secondary | ICD-10-CM | POA: Diagnosis present

## 2022-01-11 DIAGNOSIS — D696 Thrombocytopenia, unspecified: Secondary | ICD-10-CM | POA: Diagnosis present

## 2022-01-11 DIAGNOSIS — Z7901 Long term (current) use of anticoagulants: Secondary | ICD-10-CM | POA: Diagnosis not present

## 2022-01-11 DIAGNOSIS — Z20822 Contact with and (suspected) exposure to covid-19: Secondary | ICD-10-CM | POA: Diagnosis present

## 2022-01-11 DIAGNOSIS — I5032 Chronic diastolic (congestive) heart failure: Secondary | ICD-10-CM | POA: Diagnosis present

## 2022-01-11 DIAGNOSIS — W19XXXD Unspecified fall, subsequent encounter: Secondary | ICD-10-CM | POA: Diagnosis present

## 2022-01-11 DIAGNOSIS — Z66 Do not resuscitate: Secondary | ICD-10-CM | POA: Diagnosis present

## 2022-01-11 DIAGNOSIS — I5082 Biventricular heart failure: Secondary | ICD-10-CM | POA: Diagnosis present

## 2022-01-11 DIAGNOSIS — R778 Other specified abnormalities of plasma proteins: Secondary | ICD-10-CM | POA: Diagnosis not present

## 2022-01-11 DIAGNOSIS — I350 Nonrheumatic aortic (valve) stenosis: Secondary | ICD-10-CM | POA: Diagnosis present

## 2022-01-11 DIAGNOSIS — Z91048 Other nonmedicinal substance allergy status: Secondary | ICD-10-CM | POA: Diagnosis not present

## 2022-01-11 DIAGNOSIS — F39 Unspecified mood [affective] disorder: Secondary | ICD-10-CM | POA: Diagnosis present

## 2022-01-11 DIAGNOSIS — M542 Cervicalgia: Secondary | ICD-10-CM | POA: Diagnosis not present

## 2022-01-11 DIAGNOSIS — Z95828 Presence of other vascular implants and grafts: Secondary | ICD-10-CM | POA: Diagnosis not present

## 2022-01-11 DIAGNOSIS — R1312 Dysphagia, oropharyngeal phase: Secondary | ICD-10-CM | POA: Diagnosis not present

## 2022-01-11 DIAGNOSIS — I472 Ventricular tachycardia, unspecified: Secondary | ICD-10-CM | POA: Diagnosis present

## 2022-01-11 DIAGNOSIS — I4819 Other persistent atrial fibrillation: Secondary | ICD-10-CM | POA: Diagnosis not present

## 2022-01-11 DIAGNOSIS — Z888 Allergy status to other drugs, medicaments and biological substances status: Secondary | ICD-10-CM

## 2022-01-11 DIAGNOSIS — K219 Gastro-esophageal reflux disease without esophagitis: Secondary | ICD-10-CM | POA: Diagnosis present

## 2022-01-11 DIAGNOSIS — I10 Essential (primary) hypertension: Secondary | ICD-10-CM | POA: Diagnosis present

## 2022-01-11 DIAGNOSIS — N179 Acute kidney failure, unspecified: Secondary | ICD-10-CM | POA: Diagnosis present

## 2022-01-11 DIAGNOSIS — Z79899 Other long term (current) drug therapy: Secondary | ICD-10-CM

## 2022-01-11 DIAGNOSIS — R569 Unspecified convulsions: Secondary | ICD-10-CM

## 2022-01-11 DIAGNOSIS — Z87891 Personal history of nicotine dependence: Secondary | ICD-10-CM | POA: Diagnosis not present

## 2022-01-11 DIAGNOSIS — L89156 Pressure-induced deep tissue damage of sacral region: Secondary | ICD-10-CM | POA: Diagnosis present

## 2022-01-11 DIAGNOSIS — M25512 Pain in left shoulder: Secondary | ICD-10-CM | POA: Diagnosis present

## 2022-01-11 DIAGNOSIS — F40240 Claustrophobia: Secondary | ICD-10-CM | POA: Diagnosis present

## 2022-01-11 DIAGNOSIS — R5381 Other malaise: Secondary | ICD-10-CM | POA: Diagnosis present

## 2022-01-11 DIAGNOSIS — Z8673 Personal history of transient ischemic attack (TIA), and cerebral infarction without residual deficits: Secondary | ICD-10-CM

## 2022-01-11 DIAGNOSIS — N189 Chronic kidney disease, unspecified: Secondary | ICD-10-CM | POA: Diagnosis present

## 2022-01-11 DIAGNOSIS — Z8249 Family history of ischemic heart disease and other diseases of the circulatory system: Secondary | ICD-10-CM | POA: Diagnosis not present

## 2022-01-11 DIAGNOSIS — I129 Hypertensive chronic kidney disease with stage 1 through stage 4 chronic kidney disease, or unspecified chronic kidney disease: Secondary | ICD-10-CM | POA: Diagnosis present

## 2022-01-11 LAB — BODY FLUID CELL COUNT WITH DIFFERENTIAL
Eos, Fluid: 2 %
Lymphs, Fluid: 10 %
Monocyte-Macrophage-Serous Fluid: 3 % — ABNORMAL LOW (ref 50–90)
Neutrophil Count, Fluid: 85 % — ABNORMAL HIGH (ref 0–25)
Total Nucleated Cell Count, Fluid: 75 cu mm (ref 0–1000)

## 2022-01-11 LAB — COMPREHENSIVE METABOLIC PANEL
ALT: 11 U/L (ref 0–44)
AST: 26 U/L (ref 15–41)
Albumin: 3.2 g/dL — ABNORMAL LOW (ref 3.5–5.0)
Alkaline Phosphatase: 62 U/L (ref 38–126)
Anion gap: 18 — ABNORMAL HIGH (ref 5–15)
BUN: 39 mg/dL — ABNORMAL HIGH (ref 8–23)
CO2: 18 mmol/L — ABNORMAL LOW (ref 22–32)
Calcium: 8.3 mg/dL — ABNORMAL LOW (ref 8.9–10.3)
Chloride: 99 mmol/L (ref 98–111)
Creatinine, Ser: 3.05 mg/dL — ABNORMAL HIGH (ref 0.61–1.24)
GFR, Estimated: 19 mL/min — ABNORMAL LOW (ref >60)
Glucose, Bld: 159 mg/dL — ABNORMAL HIGH (ref 70–99)
Potassium: 4.4 mmol/L (ref 3.5–5.1)
Sodium: 135 mmol/L (ref 135–145)
Total Bilirubin: 1.2 mg/dL (ref 0.3–1.2)
Total Protein: 5.6 g/dL — ABNORMAL LOW (ref 6.5–8.1)

## 2022-01-11 LAB — CBC WITH DIFFERENTIAL/PLATELET
Abs Immature Granulocytes: 0.11 10*3/uL — ABNORMAL HIGH (ref 0.00–0.07)
Basophils Absolute: 0 10*3/uL (ref 0.0–0.1)
Basophils Relative: 0 %
Eosinophils Absolute: 0 10*3/uL (ref 0.0–0.5)
Eosinophils Relative: 0 %
HCT: 29.5 % — ABNORMAL LOW (ref 39.0–52.0)
Hemoglobin: 9.7 g/dL — ABNORMAL LOW (ref 13.0–17.0)
Immature Granulocytes: 1 %
Lymphocytes Relative: 5 %
Lymphs Abs: 0.6 10*3/uL — ABNORMAL LOW (ref 0.7–4.0)
MCH: 34.3 pg — ABNORMAL HIGH (ref 26.0–34.0)
MCHC: 32.9 g/dL (ref 30.0–36.0)
MCV: 104.2 fL — ABNORMAL HIGH (ref 80.0–100.0)
Monocytes Absolute: 0.7 10*3/uL (ref 0.1–1.0)
Monocytes Relative: 6 %
Neutro Abs: 9.8 10*3/uL — ABNORMAL HIGH (ref 1.7–7.7)
Neutrophils Relative %: 88 %
Platelets: 164 10*3/uL (ref 150–400)
RBC: 2.83 MIL/uL — ABNORMAL LOW (ref 4.22–5.81)
RDW: 14.3 % (ref 11.5–15.5)
WBC: 11.1 10*3/uL — ABNORMAL HIGH (ref 4.0–10.5)
nRBC: 0 % (ref 0.0–0.2)

## 2022-01-11 LAB — PROTIME-INR
INR: 1.6 — ABNORMAL HIGH (ref 0.8–1.2)
Prothrombin Time: 18.7 seconds — ABNORMAL HIGH (ref 11.4–15.2)

## 2022-01-11 LAB — PROTEIN, PLEURAL OR PERITONEAL FLUID: Total protein, fluid: 4.7 g/dL

## 2022-01-11 LAB — GLUCOSE, PLEURAL OR PERITONEAL FLUID: Glucose, Fluid: 68 mg/dL

## 2022-01-11 LAB — CBC
HCT: 37 % — ABNORMAL LOW (ref 39.0–52.0)
Hemoglobin: 12.4 g/dL — ABNORMAL LOW (ref 13.0–17.0)
MCH: 32 pg (ref 26.0–34.0)
MCHC: 33.5 g/dL (ref 30.0–36.0)
MCV: 95.4 fL (ref 80.0–100.0)
Platelets: 171 10*3/uL (ref 150–400)
RBC: 3.88 MIL/uL — ABNORMAL LOW (ref 4.22–5.81)
RDW: 15 % (ref 11.5–15.5)
WBC: 11.2 10*3/uL — ABNORMAL HIGH (ref 4.0–10.5)
nRBC: 0 % (ref 0.0–0.2)

## 2022-01-11 LAB — TROPONIN I (HIGH SENSITIVITY)
Troponin I (High Sensitivity): 148 ng/L (ref <18)
Troponin I (High Sensitivity): 184 ng/L (ref <18)

## 2022-01-11 LAB — BRAIN NATRIURETIC PEPTIDE: B Natriuretic Peptide: 226.3 pg/mL — ABNORMAL HIGH (ref 0.0–100.0)

## 2022-01-11 LAB — RESP PANEL BY RT-PCR (FLU A&B, COVID) ARPGX2
Influenza A by PCR: NEGATIVE
Influenza B by PCR: NEGATIVE
SARS Coronavirus 2 by RT PCR: NEGATIVE

## 2022-01-11 LAB — LACTATE DEHYDROGENASE: LDH: 152 U/L (ref 98–192)

## 2022-01-11 LAB — D-DIMER, QUANTITATIVE: D-Dimer, Quant: 1.89 ug/mL-FEU — ABNORMAL HIGH (ref 0.00–0.50)

## 2022-01-11 LAB — APTT: aPTT: 38 seconds — ABNORMAL HIGH (ref 24–36)

## 2022-01-11 LAB — PROTEIN, TOTAL: Total Protein: 6.2 g/dL — ABNORMAL LOW (ref 6.5–8.1)

## 2022-01-11 LAB — PREPARE RBC (CROSSMATCH)

## 2022-01-11 LAB — LACTATE DEHYDROGENASE, PLEURAL OR PERITONEAL FLUID: LD, Fluid: 227 U/L — ABNORMAL HIGH (ref 3–23)

## 2022-01-11 LAB — GLUCOSE, CAPILLARY: Glucose-Capillary: 98 mg/dL (ref 70–99)

## 2022-01-11 MED ORDER — ACETAMINOPHEN 325 MG PO TABS: 650.0000 mg | ORAL_TABLET | Freq: Four times a day (QID) | ORAL | Status: DC | PRN

## 2022-01-11 MED ORDER — OXYCODONE HCL 5 MG PO TABS
5.0000 mg | ORAL_TABLET | ORAL | Status: DC | PRN
  Administered 2022-01-13 – 2022-01-16 (×2): 5 mg via ORAL
  Filled 2022-01-11 (×2): qty 1

## 2022-01-11 MED ORDER — LACTATED RINGERS IV SOLN: INTRAVENOUS | Status: DC

## 2022-01-11 MED ORDER — DOCUSATE SODIUM 100 MG PO CAPS: 100.0000 mg | ORAL_CAPSULE | Freq: Two times a day (BID) | ORAL | Status: DC | PRN

## 2022-01-11 MED ORDER — ORAL CARE MOUTH RINSE: 15.0000 mL | OROMUCOSAL | Status: DC | PRN

## 2022-01-11 MED ORDER — ONDANSETRON HCL 4 MG PO TABS: 4.0000 mg | ORAL_TABLET | Freq: Four times a day (QID) | ORAL | Status: DC | PRN

## 2022-01-11 MED ORDER — QUETIAPINE FUMARATE 50 MG PO TABS
25.0000 mg | ORAL_TABLET | Freq: Every day | ORAL | Status: DC
  Administered 2022-01-11 – 2022-01-16 (×6): 25 mg via ORAL
  Filled 2022-01-11 (×6): qty 1

## 2022-01-11 MED ORDER — BISACODYL 5 MG PO TBEC: 5.0000 mg | DELAYED_RELEASE_TABLET | Freq: Every day | ORAL | Status: DC | PRN

## 2022-01-11 MED ORDER — IPRATROPIUM-ALBUTEROL 0.5-2.5 (3) MG/3ML IN SOLN
3.0000 mL | Freq: Once | RESPIRATORY_TRACT | Status: AC
Start: 2022-01-11 — End: 2022-01-11
  Administered 2022-01-11: 3 mL via RESPIRATORY_TRACT
  Filled 2022-01-11: qty 3

## 2022-01-11 MED ORDER — ONDANSETRON HCL 4 MG/2ML IJ SOLN: 4.0000 mg | Freq: Four times a day (QID) | INTRAMUSCULAR | Status: DC | PRN

## 2022-01-11 MED ORDER — TAMSULOSIN HCL 0.4 MG PO CAPS
0.4000 mg | ORAL_CAPSULE | Freq: Every day | ORAL | Status: DC
  Administered 2022-01-11 – 2022-01-16 (×6): 0.4 mg via ORAL
  Filled 2022-01-11 (×6): qty 1

## 2022-01-11 MED ORDER — LEVETIRACETAM 500 MG PO TABS
500.0000 mg | ORAL_TABLET | Freq: Two times a day (BID) | ORAL | Status: DC
  Administered 2022-01-11 – 2022-01-17 (×12): 500 mg via ORAL
  Filled 2022-01-11 (×12): qty 1

## 2022-01-11 MED ORDER — TRAZODONE HCL 50 MG PO TABS
50.0000 mg | ORAL_TABLET | Freq: Every evening | ORAL | Status: DC | PRN
  Administered 2022-01-13: 50 mg via ORAL
  Filled 2022-01-11: qty 1

## 2022-01-11 MED ORDER — ALPRAZOLAM 0.25 MG PO TABS
0.5000 mg | ORAL_TABLET | Freq: Two times a day (BID) | ORAL | Status: DC
  Administered 2022-01-11 – 2022-01-17 (×12): 0.5 mg via ORAL
  Filled 2022-01-11 (×2): qty 1
  Filled 2022-01-11 (×5): qty 2
  Filled 2022-01-11: qty 1
  Filled 2022-01-11 (×3): qty 2
  Filled 2022-01-11: qty 1

## 2022-01-11 MED ORDER — SODIUM CHLORIDE 0.9% IV SOLUTION
Freq: Once | INTRAVENOUS | Status: AC
Start: 2022-01-11 — End: 2022-01-11

## 2022-01-11 MED ORDER — ACETAMINOPHEN 650 MG RE SUPP: 650.0000 mg | Freq: Four times a day (QID) | RECTAL | Status: DC | PRN

## 2022-01-11 MED ORDER — PROTHROMBIN COMPLEX CONC HUMAN 500 UNITS IV KIT
4094.0000 [IU] | PACK | Status: AC
  Administered 2022-01-11: 4094 [IU] via INTRAVENOUS
  Filled 2022-01-11: qty 4094

## 2022-01-11 MED ORDER — ALBUTEROL SULFATE (2.5 MG/3ML) 0.083% IN NEBU: 2.5000 mg | INHALATION_SOLUTION | RESPIRATORY_TRACT | Status: DC | PRN

## 2022-01-11 MED ORDER — DOCUSATE SODIUM 100 MG PO CAPS
100.0000 mg | ORAL_CAPSULE | Freq: Two times a day (BID) | ORAL | Status: DC
  Administered 2022-01-11 – 2022-01-17 (×10): 100 mg via ORAL
  Filled 2022-01-11 (×11): qty 1

## 2022-01-11 MED ORDER — METOPROLOL TARTRATE 25 MG PO TABS
25.0000 mg | ORAL_TABLET | Freq: Two times a day (BID) | ORAL | Status: DC
  Administered 2022-01-11: 25 mg via ORAL
  Filled 2022-01-11 (×2): qty 1

## 2022-01-11 MED ORDER — LIDOCAINE-EPINEPHRINE (PF) 2 %-1:200000 IJ SOLN
20.0000 mL | Freq: Once | INTRAMUSCULAR | Status: AC
  Administered 2022-01-11: 20 mL via INTRADERMAL
  Filled 2022-01-11: qty 20

## 2022-01-11 MED ORDER — TIZANIDINE HCL 2 MG PO TABS
2.0000 mg | ORAL_TABLET | Freq: Every evening | ORAL | Status: DC | PRN
Start: 2022-01-11 — End: 2022-01-17

## 2022-01-11 MED ORDER — HYDRALAZINE HCL 20 MG/ML IJ SOLN: 5.0000 mg | INTRAMUSCULAR | Status: DC | PRN

## 2022-01-11 MED ORDER — OMEPRAZOLE 20 MG PO CPDR
20.0000 mg | DELAYED_RELEASE_CAPSULE | Freq: Every day | ORAL | Status: DC
  Administered 2022-01-12 – 2022-01-17 (×6): 20 mg via ORAL
  Filled 2022-01-11 (×6): qty 1

## 2022-01-11 MED ORDER — POLYETHYLENE GLYCOL 3350 17 G PO PACK: 17.0000 g | PACK | Freq: Every day | ORAL | Status: DC | PRN

## 2022-01-11 MED ORDER — ATORVASTATIN CALCIUM 10 MG PO TABS
20.0000 mg | ORAL_TABLET | Freq: Every day | ORAL | Status: DC
  Administered 2022-01-12 – 2022-01-17 (×6): 20 mg via ORAL
  Filled 2022-01-11 (×6): qty 2

## 2022-01-11 MED ORDER — SODIUM CHLORIDE 0.9% FLUSH
3.0000 mL | Freq: Two times a day (BID) | INTRAVENOUS | Status: DC
  Administered 2022-01-12 – 2022-01-17 (×11): 3 mL via INTRAVENOUS

## 2022-01-11 NOTE — Procedures (Signed)
Insertion of Chest Tube Procedure Note  Eugene [name]  [id number]  1935-12-22  Date:01/11/22  Time:4:36 PM    Provider Performing: Charlott Holler   Procedure: Pleural Catheter Insertion w/ Imaging Guidance ([id number])  Indication(s) Hemothorax Ultrasound imaging showed large loculated effusion on the left side.   Consent Risks of the procedure as well as the alternatives and risks of each were explained to the patient and/or caregiver.  Consent for the procedure was obtained and is signed in the bedside chart  Anesthesia Topical only with 1% lidocaine    Time Out Verified patient identification, verified procedure, site/side was marked, verified correct patient position, special equipment/implants available, medications/allergies/relevant history reviewed, required imaging and test results available.   Sterile Technique Maximal sterile technique including full sterile barrier drape, hand hygiene, sterile gown, sterile gloves, mask, hair covering, sterile ultrasound probe cover (if used).   Procedure Description Ultrasound used to identify appropriate pleural anatomy for placement and overlying skin marked. Area of placement cleaned and draped in sterile fashion.  A 14 French pigtail pleural catheter was placed into the left pleural space using Seldinger technique. Appropriate return of blood was obtained.  The tube was connected to atrium and placed on -20 cm H2O wall suction.   Complications/Tolerance None; patient tolerated the procedure well. Chest X-ray is ordered to verify placement.   EBL Immediately there was over 2L return of brisk blood with ongoing drainage and the patient became orthostatic  Specimen(s) Usual pleural fluid studies sent including for pleural fluid HCT.     Durel Salts, MD Pulmonary and Critical Care Medicine [facility] 01/11/2022 4:38 PM Pager: see AMION  If no response to pager, please call critical care on call (see AMION)  until 7pm After 7:00 pm call Elink

## 2022-01-11 NOTE — ED Notes (Signed)
Got patient undressed on the monitor did ekg patient is resting with call bell in reach

## 2022-01-11 NOTE — ED Notes (Signed)
Anell Barr TRN and Fannie Knee, RN in the room helping with emergent blood and fluids.  MD Celine Mans at bedside.

## 2022-01-11 NOTE — ED Notes (Signed)
MD Celine Mans at bedside and put in cook chest tube in.  2500 of output of blood.  One [place] used and a second attached.  MD Celine Mans aware and at bedside.  Consult with surgery.  Two samples taken of fluid draining into [place] collection device.

## 2022-01-11 NOTE — Consult Note (Addendum)
NAME:  Eugene [name], MRN:  [id number], DOB:  Feb 01, 1936, LOS: 0 ADMISSION DATE:  01/11/2022, CONSULTATION DATE:  Franne Forts, DO REFERRING MD:  Franne Forts, DO, CHIEF COMPLAINT:  pleural effusion  History of Present Illness:  Eugene [name] is  86 y.o.  man with chronic biventricular heart failure EF 50%, permanent atrial fibrillation on eliquis, AAA s/p endovascular repair who presents for shortness of breath of one weeks duration. He takes prn lasix for lower extremity edema.   He lives at home with his wife and is independent with ADLs.   Had a fall a week ago landing on left rib cage, but the shortness of breath started before this.   Former smoker 40+ pack years but quit 20 years ago.   Denies fevers chills night sweats, weight loss. Says he's never had a big appetite. No nausea vomiting diarrhea.   He took his morning meds including his eliquis today.    Pertinent  Medical History  CHF,  AAA s/p repair Permanent A. Fib seizures on keppra (possibly provoked from previous UTI) History of CVA Aortic valve calcifications  Significant Hospital Events: Including procedures, antibiotic start and stop dates in addition to other pertinent events     Interim History / Subjective:    Objective   Blood pressure (!) 137/105, pulse (!) 108, temperature 97.9 F (36.6 C), temperature source Oral, resp. rate 20, height 6' (1.829 m), weight 83.9 kg, SpO2 100 %.       No intake or output data in the 24 hours ending 01/11/22 1514 Filed Weights   01/11/22 1117  Weight: 83.9 kg    Examination: General: pale, tachypnic HENT: mmm, on nasal cannula Lungs: breath sounds diminished left lung field Cardiovascular: tachycardic, irregular Abdomen: soft, nontender, nondistended Extremities: bilateral lower extermity edema with chronic venous stasis Neuro: normal speech no focal asymmetry MSK: pressure ulcer POA  Chest xra reviewed - left pleural effusion noted.    Resolved Hospital Problem list     Assessment & Plan:  Left Pleural Effusion - presumed hemothorax.  Acute blood loss anemia secondary to above.  Chronic CHF Permanent Atrial Fibrillation on Eliquis   We were consulted to evaluate the patient for pleural effusion and possible chest tube. The ultrasound imaging was consistent for a left loculated pleural effusion. Chest tube placed should Immediately 2L of blood drained without cessation and the patient felt dizzy with hypotension and worsening tachycardia.  Suspect this is hemothorax related to recent fall. Usual pleural studies sent. I am concerned based on how brisk the blood flow was that this is an ongoing arterial or venous bleed. We will obtain additional IV access and admit to ICU. Will contact [facility]. Will empirically transfuse 2 units emergently and repeat a CBC stat. Will reverse eliquis with PCC. Will hydrated him with LR for his AKI and obtain a stat CTA chest to evaluate for active bleeding.  I updated his daughter [name] over the phone and informed her about the plan of care and admission to ICU.   Addendum: Talked with Dr. Laneta Simmers and [facility]. Likely this is old blood from trauma and goal is for good drainage. Will monitor with chest xray. Should he develop bright red output with clotting in the tube would be a cause for concern related to re-bleeding. Will defer CT angio for now.    The patient is critically ill due to acute blood loss anemia.  Critical care was necessary to treat or prevent imminent or  life-threatening deterioration.  Critical care was time spent personally by me on the following activities: development of treatment plan with patient and/or surrogate as well as nursing, discussions with consultants, evaluation of patient's response to treatment, examination of patient, obtaining history from patient or surrogate, ordering and performing treatments and interventions, ordering and review of laboratory studies,  ordering and review of radiographic studies, pulse oximetry, re-evaluation of patient's condition and participation in multidisciplinary rounds.   Critical Care Time devoted to patient care services described in this note is 65 minutes. This time reflects time of care of this signee Charlott Holler . This critical care time does not reflect separately billable procedures or procedure time, teaching time or supervisory time of PA/NP/Med student/Med Resident etc but could involve care discussion time.       Charlott Holler [facility] 01/11/2022 4:26 PM  Pager: see AMION  If no response to pager , please call critical care on call (see AMION) until 7pm After 7:00 pm call Elink    Best Practice (right click and "Reselect all SmartList Selections" daily)   Diet/type: NPO w/ oral meds DVT prophylaxis: not indicated GI prophylaxis: N/A Lines: N/A Foley:  N/A Code Status:  DNR Last date of multidisciplinary goals of care discussion [had with daughter stephanie and patient - patient is DNR but open to other supportive measures. ]     Labs   CBC: Recent Labs  Lab 01/11/22 1130  WBC 11.1*  NEUTROABS 9.8*  HGB 9.7*  HCT 29.5*  MCV 104.2*  PLT 164    Basic Metabolic Panel: Recent Labs  Lab 01/11/22 1130  NA 135  K 4.4  CL 99  CO2 18*  GLUCOSE 159*  BUN 39*  CREATININE 3.05*  CALCIUM 8.3*   GFR: Estimated Creatinine Clearance: 19.8 mL/min (A) (by C-G formula based on SCr of 3.05 mg/dL (H)). Recent Labs  Lab 01/11/22 1130  WBC 11.1*    Liver Function Tests: Recent Labs  Lab 01/11/22 1130  AST 26  ALT 11  ALKPHOS 62  BILITOT 1.2  PROT 5.6*  ALBUMIN 3.2*   No results for input(s): "LIPASE", "AMYLASE" in the last 168 hours. No results for input(s): "AMMONIA" in the last 168 hours.  ABG    Component Value Date/Time   PHART 7.438 07/15/2007 1040   PCO2ART 37.1 07/15/2007 1040   PO2ART 93.8 07/15/2007 1040   HCO3 24.6 (H)  07/15/2007 1040   TCO2 25.8 07/15/2007 1040   O2SAT 97.7 07/15/2007 1040     Coagulation Profile: No results for input(s): "INR", "PROTIME" in the last 168 hours.  Cardiac Enzymes: No results for input(s): "CKTOTAL", "CKMB", "CKMBINDEX", "TROPONINI" in the last 168 hours.  HbA1C: No results found for: "HGBA1C"  CBG: No results for input(s): "GLUCAP" in the last 168 hours.  Review of Systems:   +shortness of breath +fall Otw 10 points ROS negative.   Past Medical History:  He,  has a past medical history of AAA (abdominal aortic aneurysm) (HCC) (06/29/2007), Aortic stenosis (09/05/2017), GERD (gastroesophageal reflux disease), Hemochromatosis, Hyperlipidemia, Hypertension, Irritable bowel syndrome, Permanent atrial fibrillation (HCC), and Stroke (HCC).   Surgical History:   Past Surgical History:  Procedure Laterality Date   ABDOMINAL AORTIC ANEURYSM REPAIR     ENDOVASCULAR STENT INSERTION  07/18/2007   saccular infrarenal aortic aneurysm   EYE SURGERY Bilateral    Cataract     IR FLUORO GUIDE CV LINE LEFT  12/08/2018   IR FLUORO  GUIDE CV LINE RIGHT  02/13/2017   IR FLUORO GUIDE CV LINE RIGHT  02/25/2017   IR US GUIDE VASC ACCESS RIGHT  02/13/2017   PICC LINE PLACE PERIPHERAL ([facility] HX)  Oct 21, 2014     Social History:   reports that he quit smoking about 22 years ago. His smoking use included cigarettes. He has never used smokeless tobacco. He reports current alcohol use of about 1.0 - 2.0 standard drink of alcohol per week. He reports that he does not use drugs.   Family History:  His family history includes Heart disease in his father and mother; Hypertension in his mother.   Allergies Allergies  Allergen Reactions   Chlorhexidine Gluconate Itching    Don't use Chloraprep inside port a cath kits.   Macrolides And Ketolides Nausea And Vomiting   Tape Other (See Comments)    Adhesive tape,   Only use paper tape     Home Medications  Prior to Admission medications    Medication Sig Start Date End Date Taking? Authorizing Provider  acetaminophen (TYLENOL) 325 MG tablet Take 2 tablets (650 mg total) by mouth every 6 (six) hours as needed for mild pain (or Fever >/= 101). 08/28/21   Rodolph Bong, MD  ALPRAZolam Prudy Feeler) 1 MG tablet Take 0.5 tablets (0.5 mg total) by mouth 2 (two) times daily. For anxiety 04/02/21   Elgergawy, Leana Roe, MD  apixaban (ELIQUIS) 5 MG TABS tablet Take 1 tablet (5 mg total) by mouth 2 (two) times daily. 12/19/21   Sharlene Dory, PA-C  Ascorbic Acid (VITAMIN C) 100 MG tablet Take 1,000 mg by mouth daily.    [provider]  atorvastatin (LIPITOR) 20 MG tablet Take 1 tablet (20 mg total) by mouth daily. 12/19/21 12/20/22  Sharlene Dory, PA-C  Cholecalciferol (VITAMIN D3) 25 MCG (1000 UT) CAPS Take 1,000 Units by mouth daily.    [provider]  fluticasone (FLONASE) 50 MCG/ACT nasal spray Place 2 sprays into both nostrils daily as needed for allergies or rhinitis.    [provider]  furosemide (LASIX) 40 MG tablet Take 1 tablet (40 mg total) by mouth daily. 12/19/21 12/19/22  Sharlene Dory, PA-C  hyoscyamine (LEVBID) 0.375 MG 12 hr tablet Take 0.375 mg by mouth daily as needed for cramping.    [provider]  levETIRAcetam (KEPPRA) 500 MG tablet Take 1 tablet (500 mg total) by mouth 2 (two) times daily. 08/28/21   Rodolph Bong, MD  Loperamide HCl (IMODIUM PO) Take 4 mg by mouth daily as needed (loose stool).    [provider]  metoprolol tartrate (LOPRESSOR) 25 MG tablet Take 1 tablet (25 mg total) by mouth 2 (two) times daily. 12/19/21 12/20/22  Sharlene Dory, PA-C  omeprazole (PRILOSEC OTC) 20 MG tablet Take 1 tablet (20 mg total) by mouth daily. 12/19/21 12/20/22  Sharlene Dory, PA-C  potassium chloride (KLOR-CON) 10 MEQ tablet Take 1 tablet (10 mEq total) by mouth daily. 12/19/21 12/20/22  Sharlene Dory, PA-C  QUEtiapine (SEROQUEL) 25 MG tablet Take 25 mg by mouth at bedtime. 07/13/21    [provider]  tamsulosin (FLOMAX) 0.4 MG CAPS capsule Take 0.4 mg by mouth at bedtime. 06/14/21   [provider]  Thiamine HCl (VITAMIN B-1 PO) Take 250 mg by mouth daily.    [provider]  tiZANidine (ZANAFLEX) 2 MG tablet Take 2 mg by mouth at bedtime as needed for muscle spasms.  [provider]  traZODone (DESYREL) 50 MG tablet Take 50 mg by mouth at bedtime as needed for sleep.    [provider]  Vitamin E 400 units TABS Take 400 Units by mouth daily.    [provider]

## 2022-01-11 NOTE — ED Notes (Signed)
XR at bedside

## 2022-01-11 NOTE — Progress Notes (Signed)
Pt arrived to unit with 1 pair of slippers, 1 pair of sweat pants, 1 t-shirt, and 1 button up shirt, 1 wedding band, and 1 micky watch.  All items placed in patient belonging bag at bedside.

## 2022-01-11 NOTE — ED Notes (Signed)
MD Celine Mans notified about trouble getting any further IV's or labs on patient at this time.  Clydie Braun, TRN, Fannie Knee, RN and Brentton Wardlow, RN were unsuccessful at this time.

## 2022-01-11 NOTE — ED Provider Notes (Signed)
Care assumed from previous provider PA [facility]. Please see note for further details.  In short patient is a 86 year old male who presented today with shortness of breath.  He has been found to have a large left pleural effusion and increased work of breathing.  He also has some peripheral edema and there is a question of SOB 2/2 heart failure.  Critical care has been consulted and they are going to see the patient and place the chest tube.   Hospitalist has been paged and the plan is for me to take the admission call.   Physical Exam  BP (!) 137/105 (BP Location: Right Arm)   Pulse (!) 108   Temp 97.9 F (36.6 C) (Oral)   Resp 20   Ht 6' (1.829 m)   Wt 83.9 kg   SpO2 100%   BMI 25.09 kg/m   Physical Exam Vitals and nursing note reviewed.  Constitutional:      Appearance: Normal appearance.  HENT:     Head: Normocephalic and atraumatic.  Eyes:     General: No scleral icterus.    Conjunctiva/sclera: Conjunctivae normal.  Pulmonary:     Effort: Accessory muscle usage present. No respiratory distress.  Skin:    Findings: No rash.  Neurological:     Mental Status: He is alert.  Psychiatric:        Mood and Affect: Mood normal.     Procedures  Procedures  ED Course / MDM   Clinical Course as of 01/11/22 1625  Thu Jan 11, 2022  1219 DG Chest Portable 1 View [EC]  [phone] I spoke with Dr. Ophelia Charter with hospitalist team.  They requested cardiology consult but will come and see the patient [MR]  1616 Spoke with critical care team after chest tube was placed. Pt appears to have a traumatic hemothorax.  Critical care team will admit.  Dr. Ophelia Charter notified [MR]    Clinical Course User Index [EC] Janell Quiet, PA-C [MR] Wafa Martes, Gabriel Cirri, PA-C   Medical Decision Making Amount and/or Complexity of Data Reviewed Labs: ordered. Radiology: ordered. Decision-making details documented in ED Course.  Risk Prescription drug management. Decision regarding  hospitalization.   Of note, patient has a creatinine of 3 which is up from his baseline of 1.2-1.3 over the last year.  He has an elevated D-dimer however in the setting of his GFR of 19 he does not qualify for CT angiogram.  VQ scan has been ordered however since it was ordered after 3 PM it may not be done until tomorrow during patient's hospitalization.  4:15pm: Critical care physician, Dr.  Celine Mans, reports that the patient appears to have a traumatic hemothorax.  Crit care will admit the patient and consult CT surgery.  ED team to sign off at this time.   Saddie Benders, PA-C 01/11/22 1626    Edwin Dada P, DO 01/16/22 1557

## 2022-01-11 NOTE — ED Triage Notes (Signed)
Pt BIB [facility] from home due to [facility] and weakness that started yesterday.  Pt was pale and sat's of 89% on RA.  Sunday 8/13 pt fell over the edge of the bathroom and landed on his left side.  Pt does take Eliquis and has bed sores on sacrum.  VS BP 110/74, P 122, R 34, SpO2 99% 4L nasal cannula.  20g left forearm.

## 2022-01-11 NOTE — ED Provider Notes (Addendum)
[facility]PART[facility] Provider Note   CSN: [id number] Arrival date & time: 01/11/22  1107     History  Chief Complaint  Patient presents with   Shortness of Breath    Eugene [name] is a 86 y.o. male with history of AAA repair, GERD, history of CVA, HTN, chronic afib on Eliquis, cardiomegaly who presents to the ED via EMS for evaluation of shortness of breath.  Patient states that he was feeling slightly short of breath about 1 week ago and has progressively worsened in the last couple of days.  He does note that he tripped in the shower 4 days ago, landing on his left rib cage,  however his symptoms seem to have started before this.  He denies previous history of CHF although does have a reported history of cardiomegaly and occasionally takes Lasix for fluid in the ankles.  His cardiologist is Dr. Radford Pax.  Per chart review, last echocardiogram in March 2023 shows EF of 50 to 55% with moderate calcification of the aortic valve.  He denies fever, cough, congestion, chest pain, pleurisy, abdominal pain, nausea, vomiting and diarrhea he lives at home with his wife.  His daughter comes to help him take his medications every day.  He is ambulatory at home with a walker.  Patient is a non-smoker and quit in 2011.  No previous history of COPD.   Shortness of Breath Associated symptoms: no abdominal pain, no chest pain, no cough, no fever, no headaches and no vomiting    Shortness of Breath Associated symptoms: no abdominal pain, no chest pain, no cough, no fever and no vomiting    Shortness of Breath Associated symptoms: no abdominal pain, no chest pain, no fever and no vomiting    Shortness of Breath Associated symptoms: no abdominal pain, no chest pain and no fever    Shortness of Breath Associated symptoms: no chest pain and no fever    Shortness of Breath Associated symptoms: no chest pain        Home Medications Prior to Admission medications    Medication Sig Start Date End Date Taking? Authorizing Provider  acetaminophen (TYLENOL) 325 MG tablet Take 2 tablets (650 mg total) by mouth every 6 (six) hours as needed for mild pain (or Fever >/= 101). 08/28/21   Eugenie Filler, MD  ALPRAZolam Duanne Moron) 1 MG tablet Take 0.5 tablets (0.5 mg total) by mouth 2 (two) times daily. For anxiety 04/02/21   Elgergawy, Silver Huguenin, MD  apixaban (ELIQUIS) 5 MG TABS tablet Take 1 tablet (5 mg total) by mouth 2 (two) times daily. 12/19/21   Elgie Collard, PA-C  Ascorbic Acid (VITAMIN C) 100 MG tablet Take 1,000 mg by mouth daily.    [provider]  atorvastatin (LIPITOR) 20 MG tablet Take 1 tablet (20 mg total) by mouth daily. 12/19/21 12/20/22  Elgie Collard, PA-C  Cholecalciferol (VITAMIN D3) 25 MCG (1000 UT) CAPS Take 1,000 Units by mouth daily.    [provider]  fluticasone (FLONASE) 50 MCG/ACT nasal spray Place 2 sprays into both nostrils daily as needed for allergies or rhinitis.    [provider]  furosemide (LASIX) 40 MG tablet Take 1 tablet (40 mg total) by mouth daily. 12/19/21 12/19/22  Elgie Collard, PA-C  hyoscyamine (LEVBID) 0.375 MG 12 hr tablet Take 0.375 mg by mouth daily as needed for cramping.    [provider]  levETIRAcetam (KEPPRA) 500 MG tablet Take 1 tablet (500  mg total) by mouth 2 (two) times daily. 08/28/21   Rodolph Bong, MD  Loperamide HCl (IMODIUM PO) Take 4 mg by mouth daily as needed (loose stool).    [provider]  metoprolol tartrate (LOPRESSOR) 25 MG tablet Take 1 tablet (25 mg total) by mouth 2 (two) times daily. 12/19/21 12/20/22  Sharlene Dory, PA-C  omeprazole (PRILOSEC OTC) 20 MG tablet Take 1 tablet (20 mg total) by mouth daily. 12/19/21 12/20/22  Sharlene Dory, PA-C  potassium chloride (KLOR-CON) 10 MEQ tablet Take 1 tablet (10 mEq total) by mouth daily. 12/19/21 12/20/22  Sharlene Dory, PA-C  QUEtiapine (SEROQUEL) 25 MG tablet Take 25 mg by mouth at bedtime. 07/13/21    [provider]  tamsulosin (FLOMAX) 0.4 MG CAPS capsule Take 0.4 mg by mouth at bedtime. 06/14/21   [provider]  Thiamine HCl (VITAMIN B-1 PO) Take 250 mg by mouth daily.    [provider]  tiZANidine (ZANAFLEX) 2 MG tablet Take 2 mg by mouth at bedtime as needed for muscle spasms.    [provider]  traZODone (DESYREL) 50 MG tablet Take 50 mg by mouth at bedtime as needed for sleep.    [provider]  Vitamin E 400 units TABS Take 400 Units by mouth daily.    [provider]      Allergies    Chlorhexidine gluconate, Macrolides and ketolides, and Tape    Review of Systems   Review of Systems  Constitutional:  Negative for fever.  Respiratory:  Positive for shortness of breath. Negative for cough.   Cardiovascular:  Negative for chest pain.  Gastrointestinal:  Negative for abdominal pain, diarrhea, nausea and vomiting.  Genitourinary:  Negative for dysuria.  Neurological:  Negative for headaches.    Physical Exam Updated Vital Signs BP (!) 137/105 (BP Location: Right Arm)   Pulse (!) 108   Temp 97.9 F (36.6 C) (Oral)   Resp 20   Ht 6' (1.829 m)   Wt 83.9 kg   SpO2 100%   BMI 25.09 kg/m  Physical Exam Vitals and nursing note reviewed.  Constitutional:      General: He is in acute distress.     Appearance: He is ill-appearing.  HENT:     Head: Atraumatic.  Eyes:     Conjunctiva/sclera: Conjunctivae normal.  Cardiovascular:     Rate and Rhythm: Tachycardia present. Rhythm irregular.     Pulses: Normal pulses.     Heart sounds: No murmur heard. Pulmonary:     Effort: Tachypnea, accessory muscle usage and respiratory distress present.     Breath sounds: Normal breath sounds.     Comments: Diminished lung sounds, increased respiratory effort with belly retractions Chest:     Comments: Very mild tenderness over the left lateral rib cage when he fell 4 days ago.  No bruising, deformity Abdominal:     General:  Abdomen is flat. There is no distension.     Palpations: Abdomen is soft.     Tenderness: There is no abdominal tenderness.  Musculoskeletal:        General: Normal range of motion.     Cervical back: Normal range of motion.     Right lower leg: No edema.     Left lower leg: No edema.  Skin:    General: Skin is warm and dry.     Capillary Refill: Capillary refill takes less than 2 seconds.  Neurological:     General:  No focal deficit present.     Mental Status: He is alert.  Psychiatric:        Mood and Affect: Mood normal.     ED Results / Procedures / Treatments   Labs (all labs ordered are listed, but only abnormal results are displayed) Labs Reviewed  BRAIN NATRIURETIC PEPTIDE - Abnormal; Notable for the following components:      Result Value   B Natriuretic Peptide 226.3 (*)    All other components within normal limits  COMPREHENSIVE METABOLIC PANEL - Abnormal; Notable for the following components:   CO2 18 (*)    Glucose, Bld 159 (*)    BUN 39 (*)    Creatinine, Ser 3.05 (*)    Calcium 8.3 (*)    Total Protein 5.6 (*)    Albumin 3.2 (*)    GFR, Estimated 19 (*)    Anion gap 18 (*)    All other components within normal limits  CBC WITH DIFFERENTIAL/PLATELET - Abnormal; Notable for the following components:   WBC 11.1 (*)    RBC 2.83 (*)    Hemoglobin 9.7 (*)    HCT 29.5 (*)    MCV 104.2 (*)    MCH 34.3 (*)    Neutro Abs 9.8 (*)    Lymphs Abs 0.6 (*)    Abs Immature Granulocytes 0.11 (*)    All other components within normal limits  D-DIMER, QUANTITATIVE - Abnormal; Notable for the following components:   D-Dimer, Quant 1.89 (*)    All other components within normal limits  TROPONIN I (HIGH SENSITIVITY) - Abnormal; Notable for the following components:   Troponin I (High Sensitivity) 148 (*)    All other components within normal limits  TROPONIN I (HIGH SENSITIVITY) - Abnormal; Notable for the following components:   Troponin I (High Sensitivity) 184 (*)     All other components within normal limits  RESP PANEL BY RT-PCR (FLU A&B, COVID) [id number]  URINALYSIS, ROUTINE W REFLEX MICROSCOPIC    EKG EKG Interpretation  Date/Time:  Thursday January 11 2022 11:17:16 EDT Ventricular Rate:  121 PR Interval:    QRS Duration: 94 QT Interval:  322 QTC Calculation: 457 R Axis:   22 Text Interpretation: Atrial fibrillation Low voltage, precordial leads Confirmed by Campbell Stall ([id number]) on [id number] 11:34:08 AM  Radiology DG Chest Portable 1 View  Result Date: 01/11/2022 CLINICAL DATA:  Shortness of breath EXAM: PORTABLE CHEST 1 VIEW COMPARISON:  08/28/2021 FINDINGS: Cardiomegaly with rightward shift of the mediastinal contents. Large left pleural effusion with near complete atelectasis or consolidation of the left lung. The right lung is normally aerated. IMPRESSION: 1. Large left pleural effusion with near complete atelectasis or consolidation of the left lung. 2.  Cardiomegaly. Electronically Signed   By: Delanna Ahmadi M.D.   On: 01/11/2022 12:04    Procedures .Critical Care  Performed by: Tonye Pearson, PA-C Authorized by: Tonye Pearson, PA-C   Critical care provider statement:    Critical care time (minutes):  45   Critical care start time:  01/11/2022 12:30 PM   Critical care end time:  01/11/2022 1:15 PM   Critical care was necessary to treat or prevent imminent or life-threatening deterioration of the following conditions:  Respiratory failure   Critical care was time spent personally by me on the following activities:  Pulse oximetry, re-evaluation of patient's condition, ordering and review of radiographic studies, ordering and review of laboratory studies, discussions with consultants, development of treatment plan with  patient or surrogate, ordering and performing treatments and interventions, evaluation of patient's response to treatment, examination of patient, obtaining history from patient or surrogate and review of old charts   I  assumed direction of critical care for this patient from another provider in my specialty: no     Care discussed with: admitting provider       Medications Ordered in ED Medications  ipratropium-albuterol (DUONEB) 0.5-2.5 (3) MG/3ML nebulizer solution 3 mL (3 mLs Nebulization Given 01/11/22 1157)  lidocaine-EPINEPHrine (XYLOCAINE W/EPI) 2 %-1:200000 (PF) injection 20 mL (20 mLs Intradermal Given 01/11/22 1404)    ED Course/ Medical Decision Making/ A&P Clinical Course as of 01/11/22 1634  Thu Jan 11, 2022  1219 DG Chest Portable 1 View [EC]  1517 I spoke with Dr. Ophelia Charter with hospitalist team.  They requested cardiology consult but will come and see the patient [MR]  1616 Spoke with critical care team after chest tube was placed. Pt appears to have a traumatic hemothorax.  Critical care team will admit.  Dr. Ophelia Charter notified [MR]    Clinical Course User Index [EC] Janell Quiet, PA-C [MR] Redwine, Gabriel Cirri, PA-C                           Medical Decision Making Amount and/or Complexity of Data Reviewed Labs: ordered. Radiology: ordered. Decision-making details documented in ED Course.  Risk Prescription drug management. Decision regarding hospitalization.   Social determinants of health:  Social History   Socioeconomic History   Marital status: Married    Spouse name: Not on file   Number of children: Not on file   Years of education: Not on file   Highest education level: Not on file  Occupational History   Not on file  Tobacco Use   Smoking status: Former    Types: Cigarettes    Quit date: 06/18/1999    Years since quitting: 22.5   Smokeless tobacco: Never  Substance and Sexual Activity   Alcohol use: Yes    Alcohol/week: 1.0 - 2.0 standard drink of alcohol    Types: 1 - 2 Standard drinks or equivalent per week   Drug use: No   Sexual activity: Not on file  Other Topics Concern   Not on file  Social History Narrative   Not on file   Social Determinants of  Health   Financial Resource Strain: Not on file  Food Insecurity: Not on file  Transportation Needs: Not on file  Physical Activity: Not on file  Stress: Not on file  Social Connections: Not on file  Intimate Partner Violence: Not on file     Initial impression:  This patient presents to the ED for concern of shortness of breath, this involves an extensive number of treatment options, and is a complaint that carries with it a high risk of complications and morbidity.   Differentials include The emergent differential diagnosis for shortness of breath includes, but is not limited to, Pulmonary edema, bronchoconstriction, Pneumonia, Pulmonary embolism, Pneumotherax/ Hemothorax, Dysrythmia, ACS.   Comorbidities affecting care:  Numerous, per HPI  Additional history obtained: Echocardiogram from March 2023, recent labs and imaging, children at bedside  Lab Tests  I Ordered, reviewed, and interpreted labs and EKG.  The pertinent results include:  Leukocytosis 11.1 D-dimer 1.9  Imaging Studies ordered:  I ordered imaging studies including  Chest x-ray with left-sided massive pleural effusion with complete lung consolidation I independently visualized and interpreted imaging and  I agree with the radiologist interpretation.   EKG: A-fib with low voltage  Cardiac Monitoring:  The patient was maintained on a cardiac monitor.  I personally viewed and interpreted the cardiac monitored which showed an underlying rhythm of: A-fib   Medicines ordered and prescription drug management:  I ordered medication including: Duoneb   Reevaluation of the patient after these medicines showed that the patient stayed the same I have reviewed the patients home medicines and have made adjustments as needed   Critical Interventions:  Respiratory failure treatment and prevention as described above  Consultations Obtained:  I requested consultation with pulmonology,  and discussed lab and  imaging findings as well as pertinent plan - they agree to come place pigtail catheter   ED Course/Re-evaluation: Presents ill-appearing and acutely distressed.  Initial vitals show tachypnea, tachycardia and was satting 89% on room air.  This improved to 92% with 2 L nasal cannula, although he does have increased work of breathing with abdominal retractions.  Diminished lung sounds on auscultation.  No obvious pitting edema of the lower extremities.  Abdomen is soft, nondistended and nontender.  He has very mild tenderness over the left lateral rib cage from when he fell several days ago.  Chest x-ray concerning for large left-sided pleural effusion with near complete consolidation of the left lung.  I did order BiPAP, however patient is refusing as he is extremely claustrophobic and cannot handle using the mask.  His oxygen was increased to 4 L nasal cannula and he was stable with a pulse rate at around 100 and oxygen at 94%.  After discussion with my attending, Dr. Pearline Cables, patient requires as her and of a pigtail catheter.  We discussed this with patient and family at bedside who agree to have this inserted. Unable to perform chest pain unassisted and attending was participating in code that lasted for some time, so she recommends I call pulmonology for chest tube insertion.  I spoke to Wandra Arthurs who agrees to come insert tube and consult on patient. They will be available for consult during patient's admission. Patient care handed off to Redwine PA-C who will call medicine for admission   Final Clinical Impression(s) / ED Diagnoses Final diagnoses:  Pleural effusion    Rx / DC Orders ED Discharge Orders     None         Tonye Pearson, PA-C 01/11/22 1513    Tonye Pearson, PA-C [id number]    Campbell Stall P, DO [phone]

## 2022-01-11 NOTE — ED Notes (Signed)
Attempted one more time for lab draw and IV stick in right AC- unsuccessful- did tell CCM of inability to obtain any lab work.

## 2022-01-11 NOTE — ED Notes (Signed)
Critical troponin 148.  PA Conklin aware.  No new orders at this time.

## 2022-01-11 NOTE — H&P (Signed)
History and Physical    Patient: Eugene [name] YIF:[id number] DOB: [birth year] DOA: 01/11/2022 DOS: the patient was seen and examined on 01/11/2022 PCP: [name], MD  Patient coming from: Home - lives with wife; NOK: Daughter, [name], [phone]   Chief Complaint: SOB  HPI: Eugene [name] is a 85 y.o. male with medical history significant of hereditary hemochromatosis; AAA s/p stent graft; HTN; HLD, and afib presenting with SOB.  He has been having trouble breathing.  It started about 1-2 weeks ago, progressively worsening.  No cough.  +wheezing.  He does not wear home O2.  No h/o prior.  He refused BIPAP.  No fever.  No chest pain.  He fell on the edge of the bathtub and landed on his left ribcage - he is not sure this is related since it happened almost 2 weeks ago.      ER Course:  Large L pleural effusion, volume overloaded.  Refused BIPAP, 100% on 4L [place] O2.  Pulm is consulting and will place pigtail.  "Did not mention" chest pain.  Uptrending troponin 148 -> 184, will consult cardiology as well.     Review of Systems: As mentioned in the history of present illness. All other systems reviewed and are negative. Past Medical History:  Diagnosis Date   AAA (abdominal aortic aneurysm) (HCC) 06/29/2007   stent graft   Aortic stenosis 09/05/2017   Mild AS/AR by echo 01/2017.   GERD (gastroesophageal reflux disease)    Hemochromatosis    Hyperlipidemia    Hypertension    Irritable bowel syndrome    Permanent atrial fibrillation (HCC)    Stroke (HCC)    Transient Ischemic Attack   Past Surgical History:  Procedure Laterality Date   ABDOMINAL AORTIC ANEURYSM REPAIR     ENDOVASCULAR STENT INSERTION  07/18/2007   saccular infrarenal aortic aneurysm   EYE SURGERY Bilateral    Cataract     IR FLUORO GUIDE CV LINE LEFT  12/08/2018   IR FLUORO GUIDE CV LINE RIGHT  02/13/2017   IR FLUORO GUIDE CV LINE RIGHT  02/25/2017   IR US GUIDE VASC ACCESS RIGHT  02/13/2017    PICC LINE PLACE PERIPHERAL ([facility] HX)  Oct 21, 2014   Social History:  reports that he quit smoking about 22 years ago. His smoking use included cigarettes. He has a 40.00 pack-year smoking history. He has never used smokeless tobacco. He reports current alcohol use of about 1.0 - 2.0 standard drink of alcohol per week. He reports that he does not use drugs.  Allergies  Allergen Reactions   Chlorhexidine Gluconate Itching    Don't use Chloraprep inside port a cath kits.   Macrolides And Ketolides Nausea And Vomiting   Tape Other (See Comments)    Adhesive tape,   Only use paper tape    Family History  Problem Relation Age of Onset   Hypertension Mother    Heart disease Mother        AAA, he passed in his 32 s of Heart Diseasse   Heart disease Father        AAA history and he passed at age 43  of   Disease    Prior to Admission medications   Medication Sig Start Date End Date Taking? Authorizing Provider  acetaminophen (TYLENOL) 325 MG tablet Take 2 tablets (650 mg total) by mouth every 6 (six) hours as needed for mild pain (or Fever >/= 101). 08/28/21   Rodolph Bong, MD  ALPRAZolam (XANAX) 1 MG tablet Take 0.5 tablets (0.5 mg total) by mouth 2 (two) times daily. For anxiety 04/02/21   Elgergawy, Leana Roe, MD  apixaban (ELIQUIS) 5 MG TABS tablet Take 1 tablet (5 mg total) by mouth 2 (two) times daily. 12/19/21   Sharlene Dory, PA-C  Ascorbic Acid (VITAMIN C) 100 MG tablet Take 1,000 mg by mouth daily.    [provider]  atorvastatin (LIPITOR) 20 MG tablet Take 1 tablet (20 mg total) by mouth daily. 12/19/21 12/20/22  Sharlene Dory, PA-C  Cholecalciferol (VITAMIN D3) 25 MCG (1000 UT) CAPS Take 1,000 Units by mouth daily.    [provider]  fluticasone (FLONASE) 50 MCG/ACT nasal spray Place 2 sprays into both nostrils daily as needed for allergies or rhinitis.    [provider]  furosemide (LASIX) 40 MG tablet Take 1 tablet (40 mg total) by mouth daily.  12/19/21 12/19/22  Sharlene Dory, PA-C  hyoscyamine (LEVBID) 0.375 MG 12 hr tablet Take 0.375 mg by mouth daily as needed for cramping.    [provider]  levETIRAcetam (KEPPRA) 500 MG tablet Take 1 tablet (500 mg total) by mouth 2 (two) times daily. 08/28/21   Rodolph Bong, MD  Loperamide HCl (IMODIUM PO) Take 4 mg by mouth daily as needed (loose stool).    [provider]  metoprolol tartrate (LOPRESSOR) 25 MG tablet Take 1 tablet (25 mg total) by mouth 2 (two) times daily. 12/19/21 12/20/22  Sharlene Dory, PA-C  omeprazole (PRILOSEC OTC) 20 MG tablet Take 1 tablet (20 mg total) by mouth daily. 12/19/21 12/20/22  Sharlene Dory, PA-C  potassium chloride (KLOR-CON) 10 MEQ tablet Take 1 tablet (10 mEq total) by mouth daily. 12/19/21 12/20/22  Sharlene Dory, PA-C  QUEtiapine (SEROQUEL) 25 MG tablet Take 25 mg by mouth at bedtime. 07/13/21   [provider]  tamsulosin (FLOMAX) 0.4 MG CAPS capsule Take 0.4 mg by mouth at bedtime. 06/14/21   [provider]  Thiamine HCl (VITAMIN B-1 PO) Take 250 mg by mouth daily.    [provider]  tiZANidine (ZANAFLEX) 2 MG tablet Take 2 mg by mouth at bedtime as needed for muscle spasms.    [provider]  traZODone (DESYREL) 50 MG tablet Take 50 mg by mouth at bedtime as needed for sleep.    [provider]  Vitamin E 400 units TABS Take 400 Units by mouth daily.    [provider]    Physical Exam: Vitals:   01/11/22 1315 01/11/22 1330 01/11/22 1503 01/11/22 1515  BP: (!) 119/95 (!) 118/90 (!) 137/105 126/86  Pulse: (!) 101 64 (!) 108 98  Resp: 20 (!) 24 20 (!) 21  Temp:      TempSrc:      SpO2:  98% 100% 98%  Weight:      Height:       General:  Appears frail, moderately ill but in NAD on 4L [place] O2 Eyes:  PERRL, EOMI, normal lids, iris ENT:  grossly normal hearing, lips & tongue, mmm; appropriate dentition Neck:  no LAD, masses or thyromegaly Cardiovascular:  Irregularly  irregular with mild tachycardia, 3-4/6 systolic murmur. 2-3+ nonpitting LE edema, stable.  Respiratory:   Essentially no left-sided air movement.  Normal to mildly increased respiratory effort. Abdomen:  soft, NT, ND Skin:  no rash or induration seen on limited exam Musculoskeletal:  grossly normal tone BUE/BLE, good ROM, no bony abnormality Psychiatric:  grossly  normal mood and affect, speech fluent and appropriate, AOx3 Neurologic:  CN 2-12 grossly intact, moves all extremities in coordinated fashion   Radiological Exams on Admission: Independently reviewed - see discussion in A/P where applicable  DG Chest Portable 1 View  Result Date: 01/11/2022 CLINICAL DATA:  Shortness of breath EXAM: PORTABLE CHEST 1 VIEW COMPARISON:  08/28/2021 FINDINGS: Cardiomegaly with rightward shift of the mediastinal contents. Large left pleural effusion with near complete atelectasis or consolidation of the left lung. The right lung is normally aerated. IMPRESSION: 1. Large left pleural effusion with near complete atelectasis or consolidation of the left lung. 2.  Cardiomegaly. Electronically Signed   By: Jearld Lesch M.D.   On: 01/11/2022 12:04    EKG: Independently reviewed.  Afib with rate 121; low voltage with nonspecific ST changes   Labs on Admission: I have personally reviewed the available labs and imaging studies at the time of the admission.  Pertinent labs:    CO2 18 Glucose 159 BUN 39/Creatinine 3.05/GFR 19; 20/1.32/53 on 5/31 Anion gap 18 Albumin 3.2 BNP 226.3 HS troponin 148, 184 WBC 11.1 Hgb 9.7 D-dimer 1.89 COVID/flu negative   Assessment and Plan: Principal Problem:   Large pleural effusion Active Problems:   Acute kidney injury superimposed on chronic kidney disease (HCC)   Hemochromatosis   Permanent atrial fibrillation (HCC) on chronic anticoagulation   Benign essential HTN   Seizure (HCC)   Dyslipidemia   Mood disorder (HCC)   DNR (do not resuscitate)    Large  left pleural effusion -Patient with a fall against the bathtub with his left chest wall on 8/6 -He has had mild progressive SOB for 1-2 weeks that is acutely worse now -Imaging shows a very large L pleural effusion, concerning for hemothorax in this context -Currently maintaining O2 sats on [place] O2 -Pulm has been consulted and will place a chest tube -Will admit to progressive care unit (unless he decompensates and needs the ICU post-chest tube) -Underlying considerations include trauma (hemothorax), malignancy; CHF appears less likely at this time -Elevated D-dimer may be related to this issue; given renal function he cannot have a CTA and will hold on VQ scan for now since he is on Eliquis normally  AKI -Baseline creatinine is about 1.1, currently 3 -He denies urinary issues -Will check FeNa and order renal US -Continue Flomax for now -Possibly overdiuresed, as he has reportedly been taking more Lasix due to his LE edema  Afib -s/p failed DCCV x 2 -Rate controlled with metoprolol -on Eliquis - hold for now given concern for hemothorax  Hemochromatosis -Has not needed phlebotomy since 07/2020; would need to restart if ferritin is >150 -Followed by Dr. Leonides Schanz, next vision is scheduled for 04/2022  HTN -Continue lopressor  HLD -Continue atorvastatin  Cardiac issues -Has stent graft for AAA -Has h/o moderate AS with loud murmur -Prior echo on 3/31 with EF 50-55%, severe pulm HTN, and moderate AV calcification -Will order limited echo repeat -Elevated troponin with uptrending; cardiology is consulted although this appears likely to be related to demand ischemia  COPD -Probable, based on long-standing smoking history -Will follow  H/o syncope/seizure-like activity -Hospitalized for this issue in 08/2021 -He was started on Keppra with improvement -Neurology recommends indefinite Keppra  Mood d/o -Continue lorazepam, trazodone, Seroquel  DNR -I have discussed code status  with the patient and he would prefer to die a natural death should that situation arise. -He will need a gold out of facility DNR form at the  time of discharge     **UPDATE: Chest tube was placed and showed a hemothorax.  He will be admitted to ICU.    Advance Care Planning:   Code Status: DNR   Consults: Pulmonology; cardiology; PT/OT; nutrition; TOC team  DVT Prophylaxis: SCDs  Family Communication: None present; his daughter was present earlier and ICU will communicate with her  Severity of Illness: The appropriate patient status for this patient is INPATIENT. Inpatient status is judged to be reasonable and necessary in order to provide the required intensity of service to ensure the patient's safety. The patient's presenting symptoms, physical exam findings, and initial radiographic and laboratory data in the context of their chronic comorbidities is felt to place them at high risk for further clinical deterioration. Furthermore, it is not anticipated that the patient will be medically stable for discharge from the hospital within 2 midnights of admission.   * I certify that at the point of admission it is my clinical judgment that the patient will require inpatient hospital care spanning beyond 2 midnights from the point of admission due to high intensity of service, high risk for further deterioration and high frequency of surveillance required.*  Author: Jonah Blue, MD 01/11/2022 4:14 PM  For on call review [email or web address].

## 2022-01-11 NOTE — Progress Notes (Signed)
eLink Physician-Brief Progress Note Patient Name: KOL CONSUEGRA DOB: 12-26-1935 MRN: [id number]   Date of Service  01/11/2022  HPI/Events of Note  Lt hemothorax due to fall, on DOac No distress Second pleurovac drained 350 cc  eICU Interventions  CXR - much improved Hb 12.4 p - 2 u PRBC     Intervention Category Evaluation Type: New Patient Evaluation  Maddox Bratcher V. Laveda Demedeiros 01/11/2022, 10:41 PM

## 2022-01-11 NOTE — Consult Note (Signed)
[address]       [place] [address]             [phone]        [name] [place] Medical Record #[id number] Date of Birth: January 26, 1936  Referring: Celine Mans Primary Care: [name], MD Primary Cardiologist:Traci Mayford Knife, MD  Chief Complaint:    Chief Complaint  Patient presents with   Shortness of Breath    History of Present Illness:      Eugene [name] is an 86 yo male with history of chronic biventricular heart failure EF 50%, permanent atrial fibrillation on eliquis, AAA s/p endovascular repair.   He was brought to the ED via EMS earlier today with complaints of weakness and shortness of breath.  He did admit to suffering a fall over the weekend during which he landed on his left side along the edge of his bathtub.  Workup in ED showed reduced sats of 89% with normal BP of 110/74.  CXR showed a large pleural effusion, resulting in consultation of CCM to place a pigtail catheter.  This was done by Dr. Celine Mans at which point brisk blood output of 2L occurred.  The patient also experienced some orthostasis.  Cardiothoracic surgery consultation was requested for concern of continued bleeding.  The patient is currently stable.  Hgb level is 9.5 with repeat pending.  The patient is feeling overwhelmed.  Overall he feels like he is breathing better.  Pain is controlled.  He questions next steps.  Current Activity/ Functional Status: Patient is independent with mobility/ambulation, transfers, ADL's, IADL's.   Past Medical History:  Diagnosis Date   AAA (abdominal aortic aneurysm) (HCC) 06/29/2007   stent graft   Aortic stenosis 09/05/2017   Mild AS/AR by echo 01/2017.   GERD (gastroesophageal reflux disease)    Hemochromatosis    Hyperlipidemia    Hypertension    Irritable bowel syndrome    Permanent atrial fibrillation (HCC)    Stroke (HCC)    Transient Ischemic Attack    Past Surgical History:  Procedure Laterality Date   ABDOMINAL AORTIC  ANEURYSM REPAIR     ENDOVASCULAR STENT INSERTION  07/18/2007   saccular infrarenal aortic aneurysm   EYE SURGERY Bilateral    Cataract     IR FLUORO GUIDE CV LINE LEFT  12/08/2018   IR FLUORO GUIDE CV LINE RIGHT  02/13/2017   IR FLUORO GUIDE CV LINE RIGHT  02/25/2017   IR US GUIDE VASC ACCESS RIGHT  02/13/2017   PICC LINE PLACE PERIPHERAL ([facility] HX)  Oct 21, 2014    Social History   Tobacco Use  Smoking Status Former   Packs/day: 1.00   Years: 40.00   Total pack years: 40.00   Types: Cigarettes   Quit date: 06/18/1999   Years since quitting: 22.5  Smokeless Tobacco Never    Social History   Substance and Sexual Activity  Alcohol Use Yes   Alcohol/week: 1.0 - 2.0 standard drink of alcohol   Types: 1 - 2 Standard drinks or equivalent per week     Allergies  Allergen Reactions   Chlorhexidine Gluconate Itching    Don't use Chloraprep inside port a cath kits.   Macrolides And Ketolides Nausea And Vomiting   Tape Other (See Comments)    Adhesive tape,   Only use paper tape    Current Facility-Administered Medications  Medication Dose Route Frequency Provider Last Rate Last Admin   0.9 %  sodium chloride  infusion (Manually program via Guardrails IV Fluids)   Intravenous Once Luciano Cutter, MD       acetaminophen (TYLENOL) tablet 650 mg  650 mg Oral Q6H PRN Jonah Blue, MD       Or   acetaminophen (TYLENOL) suppository 650 mg  650 mg Rectal Q6H PRN Jonah Blue, MD       albuterol (PROVENTIL) (2.5 MG/3ML) 0.083% nebulizer solution 2.5 mg  2.5 mg Nebulization Q2H PRN Jonah Blue, MD       ALPRAZolam Prudy Feeler) tablet 0.5 mg  0.5 mg Oral BID Jonah Blue, MD       [START ON 01/12/2022] atorvastatin (LIPITOR) tablet 20 mg  20 mg Oral Daily Jonah Blue, MD       bisacodyl (DULCOLAX) EC tablet 5 mg  5 mg Oral Daily PRN Jonah Blue, MD       docusate sodium (COLACE) capsule 100 mg  100 mg Oral BID Jonah Blue, MD       hydrALAZINE (APRESOLINE) injection 5 mg   5 mg Intravenous Q4H PRN Jonah Blue, MD       lactated ringers infusion   Intravenous Continuous Jonah Blue, MD       lactated ringers infusion   Intravenous Continuous Charlott Holler, MD       levETIRAcetam (KEPPRA) tablet 500 mg  500 mg Oral BID Jonah Blue, MD       metoprolol tartrate (LOPRESSOR) tablet 25 mg  25 mg Oral BID Jonah Blue, MD       [START ON 01/12/2022] omeprazole (PRILOSEC) capsule 20 mg  20 mg Oral Daily Jonah Blue, MD       ondansetron [facility]) tablet 4 mg  4 mg Oral Q6H PRN Jonah Blue, MD       Or   ondansetron [facility]) injection 4 mg  4 mg Intravenous Q6H PRN Jonah Blue, MD       oxyCODONE (Oxy IR/ROXICODONE) immediate release tablet 5 mg  5 mg Oral Q4H PRN Jonah Blue, MD       polyethylene glycol (MIRALAX / GLYCOLAX) packet 17 g  17 g Oral Daily PRN Jonah Blue, MD       prothrombin complex conc human [facility]) IVPB 4,094 Units  4,709 Units Intravenous STAT Luciano Cutter, MD       QUEtiapine (SEROQUEL) tablet 25 mg  25 mg Oral QHS Jonah Blue, MD       sodium chloride flush (NS) 0.9 % injection 3 mL  3 mL Intravenous Q12H Jonah Blue, MD       tamsulosin [facility]) capsule 0.4 mg  0.4 mg Oral QHS Jonah Blue, MD       tiZANidine (ZANAFLEX) tablet 2 mg  2 mg Oral QHS PRN Jonah Blue, MD       traZODone (DESYREL) tablet 50 mg  50 mg Oral QHS PRN Jonah Blue, MD       Current Outpatient Medications  Medication Sig Dispense Refill   acetaminophen (TYLENOL) 325 MG tablet Take 2 tablets (650 mg total) by mouth every 6 (six) hours as needed for mild pain (or Fever >/= 101).     ALPRAZolam (XANAX) 1 MG tablet Take 0.5 tablets (0.5 mg total) by mouth 2 (two) times daily. For anxiety 14 tablet 0   apixaban (ELIQUIS) 5 MG TABS tablet Take 1 tablet (5 mg total) by mouth 2 (two) times daily. 60 tablet 2   Ascorbic Acid (VITAMIN C) 100 MG tablet Take 1,000 mg by mouth daily.  atorvastatin (LIPITOR) 20 MG tablet  Take 1 tablet (20 mg total) by mouth daily. 90 tablet 3   Cholecalciferol (VITAMIN D3) 25 MCG (1000 UT) CAPS Take 1,000 Units by mouth daily.     fluticasone (FLONASE) 50 MCG/ACT nasal spray Place 2 sprays into both nostrils daily as needed for allergies or rhinitis.     furosemide (LASIX) 40 MG tablet Take 1 tablet (40 mg total) by mouth daily. 90 tablet 3   hyoscyamine (LEVBID) 0.375 MG 12 hr tablet Take 0.375 mg by mouth daily as needed for cramping.     levETIRAcetam (KEPPRA) 500 MG tablet Take 1 tablet (500 mg total) by mouth 2 (two) times daily. 60 tablet 1   Loperamide HCl (IMODIUM PO) Take 4 mg by mouth daily as needed (loose stool).     metoprolol tartrate (LOPRESSOR) 25 MG tablet Take 1 tablet (25 mg total) by mouth 2 (two) times daily. 180 tablet 3   omeprazole (PRILOSEC OTC) 20 MG tablet Take 1 tablet (20 mg total) by mouth daily. 90 tablet 3   potassium chloride (KLOR-CON) 10 MEQ tablet Take 1 tablet (10 mEq total) by mouth daily. 90 tablet 3   tamsulosin (FLOMAX) 0.4 MG CAPS capsule Take 0.4 mg by mouth at bedtime.     Thiamine HCl (VITAMIN B-1 PO) Take 250 mg by mouth daily.     tiZANidine (ZANAFLEX) 2 MG tablet Take 2 mg by mouth at bedtime as needed for muscle spasms.     Vitamin E 400 units TABS Take 400 Units by mouth daily.     QUEtiapine (SEROQUEL) 25 MG tablet Take 25 mg by mouth at bedtime. (Patient not taking: Reported on 01/11/2022)     traZODone (DESYREL) 50 MG tablet Take 50 mg by mouth at bedtime as needed for sleep. (Patient not taking: Reported on 01/11/2022)     Facility-Administered Medications Ordered in Other Encounters  Medication Dose Route Frequency Provider Last Rate Last Admin   alteplase (CATHFLO ACTIVASE) injection 2 mg  2 mg Intracatheter Once PRN Magrinat, Valentino Hue, MD       sodium chloride 0.9 % injection 10 mL  10 mL Intravenous PRN Magrinat, Valentino Hue, MD   10 mL at 11/25/14 1220    (Not in a hospital admission)   Family History  Problem Relation  Age of Onset   Hypertension Mother    Heart disease Mother        AAA, he passed in his 19 s of Heart Diseasse   Heart disease Father        AAA history and he passed at age 56  of   Disease     Review of Systems:   ROS  Pertinent items are noted in HPI.    Physical Exam: BP 126/86   Pulse 98   Temp (!) 97.5 F (36.4 C) (Oral)   Resp (!) 21   Ht 6' (1.829 m)   Wt 83.9 kg   SpO2 98%   BMI 25.09 kg/m    General appearance: alert, cooperative, and no distress Head: Normocephalic, without obvious abnormality, atraumatic Resp: diminished breath sounds bibasilar Cardio: irregularly irregular rhythm GI: soft, non-tender; bowel sounds normal; no masses,  no organomegaly Extremities: extremities normal, atraumatic, no cyanosis or edema Neurologic: Grossly normal  Diagnostic Studies & Laboratory data:     Recent Radiology Findings:   DG Chest Portable 1 View  Result Date: 01/11/2022 CLINICAL DATA:  Shortness of breath EXAM: PORTABLE CHEST 1 VIEW COMPARISON:  08/28/2021 FINDINGS: Cardiomegaly with rightward shift of the mediastinal contents. Large left pleural effusion with near complete atelectasis or consolidation of the left lung. The right lung is normally aerated. IMPRESSION: 1. Large left pleural effusion with near complete atelectasis or consolidation of the left lung. 2.  Cardiomegaly. Electronically Signed   By: Jearld Lesch M.D.   On: 01/11/2022 12:04     I have independently reviewed the above radiologic studies and discussed with the patient   Recent Lab Findings: Lab Results  Component Value Date   WBC 11.1 (H) 01/11/2022   HGB 9.7 (L) 01/11/2022   HCT 29.5 (L) 01/11/2022   PLT 164 01/11/2022   GLUCOSE 159 (H) 01/11/2022   ALT 11 01/11/2022   AST 26 01/11/2022   NA 135 01/11/2022   K 4.4 01/11/2022   CL 99 01/11/2022   CREATININE 3.05 (H) 01/11/2022   BUN 39 (H) 01/11/2022   CO2 18 (L) 01/11/2022   TSH 0.925 08/25/2021   INR 1.3 (H) 03/24/2021       Assessment / Plan:      86 yo male with large hemothorax on the left.  He underwent chest tube placement with brisk return of bloody drainage, output has been 2500 cc since placement.   Overall patient appears stable at this time.  There does not appear to be a reason for acute surgical intervention.   Would continue to hold Eliquis. CXR post chest tube placement with significant improvement in pleural effusion, no CXR ordered for today, will repeat in AM...will continue to follow up patient, can hopefully avoid surgical intervention if effusion is appropriately drained  I  spent 55 minutes counseling the patient face to face.  Lowella Dandy, PA-C 01/11/2022 4:57 PM

## 2022-01-12 ENCOUNTER — Inpatient Hospital Stay (HOSPITAL_COMMUNITY): Payer: Medicare Other

## 2022-01-12 DIAGNOSIS — J9601 Acute respiratory failure with hypoxia: Secondary | ICD-10-CM | POA: Diagnosis not present

## 2022-01-12 DIAGNOSIS — I10 Essential (primary) hypertension: Secondary | ICD-10-CM

## 2022-01-12 DIAGNOSIS — Z4682 Encounter for fitting and adjustment of non-vascular catheter: Secondary | ICD-10-CM | POA: Diagnosis not present

## 2022-01-12 DIAGNOSIS — R778 Other specified abnormalities of plasma proteins: Secondary | ICD-10-CM

## 2022-01-12 DIAGNOSIS — E440 Moderate protein-calorie malnutrition: Secondary | ICD-10-CM | POA: Insufficient documentation

## 2022-01-12 DIAGNOSIS — J942 Hemothorax: Secondary | ICD-10-CM | POA: Diagnosis not present

## 2022-01-12 LAB — CBC
HCT: 30.9 % — ABNORMAL LOW (ref 39.0–52.0)
HCT: 34.4 % — ABNORMAL LOW (ref 39.0–52.0)
Hemoglobin: 10.5 g/dL — ABNORMAL LOW (ref 13.0–17.0)
Hemoglobin: 11.9 g/dL — ABNORMAL LOW (ref 13.0–17.0)
MCH: 31.8 pg (ref 26.0–34.0)
MCH: 31.8 pg (ref 26.0–34.0)
MCHC: 34 g/dL (ref 30.0–36.0)
MCHC: 34.6 g/dL (ref 30.0–36.0)
MCV: 93.6 fL (ref 80.0–100.0)
MCV: 92 fL (ref 80.0–100.0)
Platelets: 150 10*3/uL (ref 150–400)
Platelets: 155 10*3/uL (ref 150–400)
RBC: 3.3 MIL/uL — ABNORMAL LOW (ref 4.22–5.81)
RBC: 3.74 MIL/uL — ABNORMAL LOW (ref 4.22–5.81)
RDW: 15.1 % (ref 11.5–15.5)
RDW: 15.2 % (ref 11.5–15.5)
WBC: 10.5 10*3/uL (ref 4.0–10.5)
WBC: 10.1 10*3/uL (ref 4.0–10.5)
nRBC: 0 % (ref 0.0–0.2)
nRBC: 0.2 % (ref 0.0–0.2)

## 2022-01-12 LAB — BASIC METABOLIC PANEL
Anion gap: 10 (ref 5–15)
BUN: 47 mg/dL — ABNORMAL HIGH (ref 8–23)
CO2: 27 mmol/L (ref 22–32)
Calcium: 8.2 mg/dL — ABNORMAL LOW (ref 8.9–10.3)
Chloride: 99 mmol/L (ref 98–111)
Creatinine, Ser: 2.53 mg/dL — ABNORMAL HIGH (ref 0.61–1.24)
GFR, Estimated: 24 mL/min — ABNORMAL LOW (ref >60)
Glucose, Bld: 90 mg/dL (ref 70–99)
Potassium: 4 mmol/L (ref 3.5–5.1)
Sodium: 136 mmol/L (ref 135–145)

## 2022-01-12 LAB — URINALYSIS, ROUTINE W REFLEX MICROSCOPIC
Bilirubin Urine: NEGATIVE
Glucose, UA: NEGATIVE mg/dL
Hgb urine dipstick: NEGATIVE
Ketones, ur: NEGATIVE mg/dL
Leukocytes,Ua: NEGATIVE
Nitrite: NEGATIVE
Protein, ur: NEGATIVE mg/dL
Specific Gravity, Urine: 1.012 (ref 1.005–1.030)
pH: 5 (ref 5.0–8.0)

## 2022-01-12 LAB — ECHOCARDIOGRAM LIMITED
Height: 72 in
Weight: 2948.87 oz

## 2022-01-12 LAB — MAGNESIUM: Magnesium: 2.1 mg/dL (ref 1.7–2.4)

## 2022-01-12 LAB — HEPARIN LEVEL (UNFRACTIONATED): Heparin Unfractionated: >1.1 IU/mL — ABNORMAL HIGH (ref 0.30–0.70)

## 2022-01-12 LAB — GLUCOSE, CAPILLARY: Glucose-Capillary: 105 mg/dL — ABNORMAL HIGH (ref 70–99)

## 2022-01-12 LAB — MRSA NEXT GEN BY PCR, NASAL: MRSA by PCR Next Gen: NOT DETECTED

## 2022-01-12 LAB — PHOSPHORUS: Phosphorus: 4.8 mg/dL — ABNORMAL HIGH (ref 2.5–4.6)

## 2022-01-12 LAB — PREPARE RBC (CROSSMATCH)

## 2022-01-12 MED ORDER — METOPROLOL TARTRATE 12.5 MG HALF TABLET
12.5000 mg | ORAL_TABLET | Freq: Two times a day (BID) | ORAL | Status: DC
  Administered 2022-01-12 – 2022-01-13 (×2): 12.5 mg via ORAL
  Filled 2022-01-12 (×2): qty 1

## 2022-01-12 MED ORDER — BOOST / RESOURCE BREEZE PO LIQD CUSTOM
1.0000 | Freq: Three times a day (TID) | ORAL | Status: DC
  Administered 2022-01-12 – 2022-01-17 (×13): 1 via ORAL

## 2022-01-12 MED ORDER — ADULT MULTIVITAMIN W/MINERALS CH
1.0000 | ORAL_TABLET | Freq: Every day | ORAL | Status: DC
Start: 2022-01-12 — End: 2022-01-17
  Administered 2022-01-12 – 2022-01-17 (×6): 1 via ORAL
  Filled 2022-01-12 (×6): qty 1

## 2022-01-12 NOTE — Consult Note (Signed)
WOC Nurse Consult Note: Reason for Consult: DTPI vs Stage 1 Wound type: Deep Tissue Pressure Injury with chronic tissue damage (CTI) from moisture (shagginess of tissue), macerated Patient reports he wears an incontinence brief at home. Down at home for a while on his back as well per patient; fall with injury to the left chest wall. With this fall laid supine for some time Pressure Injury POA: Yes Measurement:10cm x 8cm x 0.1cm  Wound bed: some small areas of dark purple, non blanchable tissue, shaggy tissue and reddened from moisture  Drainage (amount, consistency, odor) none Periwound: intact  Dressing procedure/placement/frequency: Continue silicone foam per the nursing skin care order set.  LALM in place while in the ICU for moisture management and pressure redistribution. Managing urinary incontinence with male Purewick at this time.   Discussed POC with patient and bedside nurse.  Re consult if needed, will not follow at this time. Thanks  [employer]ola [employer]man [employer], RN,CWOCN, CNS, CWON-AP [phone])

## 2022-01-12 NOTE — Consult Note (Addendum)
NAME:  Eugene [name], MRN:  [id number], DOB:  1936-05-26, LOS: 1 ADMISSION DATE:  01/11/2022, CONSULTATION DATE:  Steffanie Dunn, DO REFERRING MD:  Steffanie Dunn, DO, CHIEF COMPLAINT:  pleural effusion  History of Present Illness:  Eugene [name] is  86 y.o.  man with chronic biventricular heart failure EF 50%, permanent atrial fibrillation on eliquis, AAA s/p endovascular repair who presents for shortness of breath of one weeks duration. He takes prn lasix for lower extremity edema.  He lives at home with his wife and is independent with ADLs.  Had a fall a week ago landing on left rib cage, but the shortness of breath started before this.  Former smoker 40+ pack years but quit 20 years ago.  Denies fevers chills night sweats, weight loss. Says he's never had a big appetite. No nausea vomiting diarrhea.  He took his morning meds including his eliquis today.    Pertinent  Medical History  CHF,  AAA s/p repair Permanent A. Fib seizures on keppra (possibly provoked from previous UTI) History of CVA Aortic valve calcifications  Significant Hospital Events: Including procedures, antibiotic start and stop dates in addition to other pertinent events   8/17 admitted to ICU  Interim History / Subjective:    Objective   Blood pressure (!) 96/58, pulse 78, temperature (!) 97.4 F (36.3 C), temperature source Oral, resp. rate 10, height 6' (1.829 m), weight 83.6 kg, SpO2 98 %.        Intake/Output Summary (Last 24 hours) at 01/12/2022 0718 Last data filed at 01/12/2022 0630 Gross per 24 hour  Intake 1534.4 ml  Output 500 ml  Net 1034.4 ml   Filed Weights   01/11/22 1117 01/11/22 2200 01/12/22 0500  Weight: 83.9 kg 83.6 kg 83.6 kg    Examination: General: comfortably resting in bed, NAD HENT: mmm, on nasal cannula 2L Lungs: breath sounds diminished left lung field Cardiovascular: irregular, regular rhythm Abdomen: soft, nontender, nondistended Extremities: bilateral  lower extermity edema with chronic venous stasis Neuro: normal speech no focal asymmetry MSK: pressure ulcer POA  Chest xra reviewed - left pleural effusion noted.   Resolved Hospital Problem list     Assessment & Plan:   Left hemothorax Ultrasound imaging was consistent for a left loculated pleural effusion. Chest tube placed with 2L of blood drained immediately. Dr. Laneta Simmers of [facility] likely this old blood from trauma and goal is for good drainage.  2 units PRBC administered. Eliquis reversed with PCC. Admitted to ICU Repeat CXR with decrease in effusion and resolving atelectasis.  - Should he develop bright red output with clotting in the tube would be a cause for concern related to re-bleeding.  - Will defer CT angio for now. - tele - supplemental O2  Acute blood loss anemia secondary to above 2 units PRBC  administered.  - monitor with daily CBC - give 1 unit PRBC for hemodynamic stability given soft BP - transfuse if he does not tolerate drop in hgb or if hgb less than 7  AKI Baseline creatinine is about 1.1. No evidence of hydronephrosis on renal US. - Continue Flomax for now -Will check FeNa  Chronic CHF - half home dose metop in setting of ABLA.   Permanent Atrial Fibrillation on Eliquis - Eliquis being held - Rate controlled with metop. Half home dose in setting of ABLA  H/o syncope/seizure-like activity -Neurology recommends indefinite Keppra  Mood d/o -Continue lorazepam, trazodone, Seroquel   Best Practice (right click  and "Reselect all SmartList Selections" daily)   Diet/type: NPO w/ oral meds DVT prophylaxis: not indicated GI prophylaxis: N/A Lines: N/A Foley:  N/A Code Status:  DNR Last date of multidisciplinary goals of care discussion [had with daughter stephanie and patient - patient is DNR but open to other supportive measures. ]

## 2022-01-12 NOTE — Progress Notes (Signed)
Patient requesting to use his home Afrin nose spray. Per Dr. Chestine Spore it is okay for patient to use the Afrin at bedside. Will give to patient now and pass along to oncoming RN.

## 2022-01-12 NOTE — Evaluation (Signed)
Occupational Therapy Evaluation Patient Details Name: Eugene [name] MRN: [id number] DOB: 06/05/1935 Today's Date: 01/12/2022   History of Present Illness pt is an 86 y/o male admitted 8/17 with progressive SOB.  2 weeks ago fell on the edge of the bathtub and landed on his left ribcage.  Imaging showed Large L pleural effusion in the ED.  PMHx:  AAA, HLD, HTN, afib, stroke   Clinical Impression   Pt admitted for concerns listed above. PTA pt reported that he was able to complete ADL's and functional mobility at a supervision level. Pt has 24/7 assist at home from family and personal aides. At this time pt is limited by pain in his L ribcage, weakness, and balance deficits. He is requiring up to max A +2 for bed mobility and Mod A +2 for standing and steadying. UB ADL's, pt can complete with min A in sitting, however LB ADL's pt needs up to max A +2 at this time. Anticipate as pain lessens, pt will be able to complete functional mobility and ADL's with less assist and will be able to return home with 24/7 assist. Recommending HHOT to maximize independence and safety. OT will follow acutely.       Recommendations for follow up therapy are one component of a multi-disciplinary discharge planning process, led by the attending physician.  Recommendations may be updated based on patient status, additional functional criteria and insurance authorization.   Follow Up Recommendations  Home health OT    Assistance Recommended at Discharge Frequent or constant Supervision/Assistance  Patient can return home with the following Two people to help with walking and/or transfers;Two people to help with bathing/dressing/bathroom;Assistance with cooking/housework;Direct supervision/assist for medications management;Assist for transportation;Help with stairs or ramp for entrance    Functional Status Assessment  Patient has had a recent decline in their functional status and demonstrates the ability to make  significant improvements in function in a reasonable and predictable amount of time.  Equipment Recommendations  None recommended by OT    Recommendations for Other Services       Precautions / Restrictions Precautions Precautions: Fall;Other (comment) Precaution Comments: PE and rib fractures Restrictions Weight Bearing Restrictions: No      Mobility Bed Mobility Overal bed mobility: Needs Assistance Bed Mobility: Supine to Sit, Sit to Supine     Supine to sit: Max assist, +2 for physical assistance, +2 for safety/equipment, HOB elevated Sit to supine: Max assist, +2 for physical assistance, +2 for safety/equipment   General bed mobility comments: Max A for BLE managment, truncal support to sitting and returning to supine, as well as scooting towards EOB    Transfers Overall transfer level: Needs assistance Equipment used: 2 person hand held assist Transfers: Sit to/from Stand Sit to Stand: Mod assist, +2 physical assistance, +2 safety/equipment           General transfer comment: Mod A +2 to power up and steady. Limited by pain.      Balance Overall balance assessment: Needs assistance Sitting-balance support: Feet supported, No upper extremity supported Sitting balance-Leahy Scale: Good     Standing balance support: Bilateral upper extremity supported Standing balance-Leahy Scale: Poor                             ADL either performed or assessed with clinical judgement   ADL Overall ADL's : Needs assistance/impaired Eating/Feeding: Set up;Sitting   Grooming: Set up;Sitting   Upper Body Bathing: Minimal  assistance;Sitting   Lower Body Bathing: Moderate assistance;+2 for safety/equipment;+2 for physical assistance;Sitting/lateral leans;Sit to/from stand   Upper Body Dressing : Minimal assistance;Sitting   Lower Body Dressing: Moderate assistance;+2 for physical assistance;+2 for safety/equipment;Sitting/lateral leans;Sit to/from stand    Toilet Transfer: Moderate assistance;+2 for safety/equipment;Stand-pivot   Toileting- Clothing Manipulation and Hygiene: Moderate assistance;+2 for physical assistance;+2 for safety/equipment;Sitting/lateral lean;Sit to/from stand       Functional mobility during ADLs: Moderate assistance;+2 for safety/equipment;+2 for physical assistance General ADL Comments: Limited by pain and weakness     Vision Baseline Vision/History: 1 Wears glasses Ability to See in Adequate Light: 0 Adequate Patient Visual Report: No change from baseline Vision Assessment?: No apparent visual deficits     Perception     Praxis      Pertinent Vitals/Pain Pain Assessment Pain Assessment: Faces Faces Pain Scale: Hurts even more Pain Location: L chest/rib cage Pain Descriptors / Indicators: Burning, Discomfort, Grimacing Pain Intervention(s): Monitored during session, Repositioned, Limited activity within patient's tolerance     Hand Dominance Right   Extremity/Trunk Assessment Upper Extremity Assessment Upper Extremity Assessment: Generalized weakness   Lower Extremity Assessment Lower Extremity Assessment: Defer to PT evaluation   Cervical / Trunk Assessment Cervical / Trunk Assessment: Kyphotic;Other exceptions Cervical / Trunk Exceptions: Rib fracture and chest tube   Communication Communication Communication: HOH   Cognition Arousal/Alertness: Awake/alert Behavior During Therapy: WFL for tasks assessed/performed Overall Cognitive Status: No family/caregiver present to determine baseline cognitive functioning                                 General Comments: Follows all simple commands, oriented to self, place, time and situation.     General Comments  VSS on 2L    Exercises     Shoulder Instructions      Home Living Family/patient expects to be discharged to:: Private residence Living Arrangements: Spouse/significant other Available Help at Discharge:  Family;Personal care attendant;Available 24 hours/day Type of Home: House Home Access: Level entry     Home Layout: One level     Bathroom Shower/Tub: Producer, television/film/video: Handicapped height Bathroom Accessibility: Yes   Home Equipment: Agricultural consultant (2 wheels);Grab bars - tub/shower;Shower seat - built in;Toilet riser   Additional Comments: pt has 24/7 caregiver for himself and his wife that assists with all care      Prior Functioning/Environment Prior Level of Function : Needs assist             Mobility Comments: pt was amb with RW with supervision, pt able to take self to bathroom, no driving, no cooking          OT Problem List: Decreased strength;Decreased activity tolerance;Impaired balance (sitting and/or standing);Decreased safety awareness;Decreased coordination;Cardiopulmonary status limiting activity;Pain      OT Treatment/Interventions: Self-care/ADL training;Therapeutic exercise;Energy conservation;DME and/or AE instruction;Therapeutic activities;Patient/family education;Balance training    OT Goals(Current goals can be found in the care plan section) Acute Rehab OT Goals Patient Stated Goal: To get his strength back OT Goal Formulation: With patient Time For Goal Achievement: 01/26/22 Potential to Achieve Goals: Good ADL Goals Pt Will Perform Grooming: with min guard assist;standing Pt Will Perform Lower Body Bathing: with min guard assist;sitting/lateral leans;sit to/from stand Pt Will Perform Lower Body Dressing: with min guard assist;sitting/lateral leans;sit to/from stand Pt Will Transfer to Toilet: with min guard assist;ambulating Pt Will Perform Toileting - Clothing Manipulation and hygiene: with min  guard assist;sitting/lateral leans;sit to/from stand  OT Frequency: Min 2X/week    Co-evaluation              AM-PAC OT "6 Clicks" Daily Activity     Outcome Measure Help from another person eating meals?: A Little Help from  another person taking care of personal grooming?: A Little Help from another person toileting, which includes using toliet, bedpan, or urinal?: A Lot Help from another person bathing (including washing, rinsing, drying)?: A Lot Help from another person to put on and taking off regular upper body clothing?: A Little Help from another person to put on and taking off regular lower body clothing?: A Lot 6 Click Score: 15   End of Session Equipment Utilized During Treatment: Oxygen Nurse Communication: Mobility status  Activity Tolerance: Patient tolerated treatment well Patient left: in bed;with call bell/phone within reach  OT Visit Diagnosis: Unsteadiness on feet (R26.81);Other abnormalities of gait and mobility (R26.89);Muscle weakness (generalized) (M62.81)                Time: 4680-3212 OT Time Calculation (min): 18 min Charges:  OT General Charges $OT Visit: 1 Visit OT Evaluation $OT Eval Moderate Complexity: 1 Mod  Rigo Letts H., OTR/L Acute Rehabilitation  Machai Desmith Elane Jimi Giza 01/12/2022, 6:00 PM

## 2022-01-12 NOTE — Evaluation (Signed)
Physical Therapy Evaluation Patient Details Name: Eugene [name] MRN: [id number] DOB: 04-29-1936 Today's Date: 01/12/2022  History of Present Illness  pt is an 86 y/o male admitted 8/17 with progressive SOB.  2 weeks ago fell on the edge of the bathtub and landed on his left ribcage.  Imaging showed Large L pleural effusion in the ED.  PMHx:  AAA, HLD, HTN, afib, stroke  Clinical Impression  Pt admitted with/for progressive SOB.  Pt very sore in L ribs and having problems mobilizing without moderate assist.  Pt currently limited functionally due to the problems listed below.  (see problems list.)  Pt will benefit from PT to maximize function and safety to be able to get home safely with available assist.        Recommendations for follow up therapy are one component of a multi-disciplinary discharge planning process, led by the attending physician.  Recommendations may be updated based on patient status, additional functional criteria and insurance authorization.  Follow Up Recommendations Home health PT      Assistance Recommended at Discharge Intermittent Supervision/Assistance  Patient can return home with the following  A little help with walking and/or transfers;A little help with bathing/dressing/bathroom;Assistance with cooking/housework;Direct supervision/assist for medications management;Direct supervision/assist for financial management;Assist for transportation;Help with stairs or ramp for entrance    Equipment Recommendations None recommended by PT  Recommendations for Other Services       Functional Status Assessment Patient has had a recent decline in their functional status and demonstrates the ability to make significant improvements in function in a reasonable and predictable amount of time.     Precautions / Restrictions Precautions Precautions: Fall;Other (comment) Precaution Comments: PE and rib fractures Restrictions Weight Bearing Restrictions: No       Mobility  Bed Mobility Overal bed mobility: Needs Assistance Bed Mobility: Supine to Sit, Sit to Supine     Supine to sit: Max assist, +2 for physical assistance, +2 for safety/equipment, HOB elevated Sit to supine: Max assist, +2 for physical assistance, +2 for safety/equipment   General bed mobility comments: Max A for BLE managment, truncal support to sitting and returning to supine, as well as scooting towards EOB    Transfers Overall transfer level: Needs assistance Equipment used: 2 person hand held assist Transfers: Sit to/from Stand Sit to Stand: Mod assist, +2 physical assistance, +2 safety/equipment           General transfer comment: Mod A +2 to power up and steady. Limited by pain.    Ambulation/Gait               General Gait Details: side stepping up to [facility] with mod assist of 2 persons for safety  Stairs            Wheelchair Mobility    Modified Rankin (Stroke Patients Only)       Balance Overall balance assessment: Needs assistance Sitting-balance support: Feet supported, No upper extremity supported Sitting balance-Leahy Scale: Good     Standing balance support: Bilateral upper extremity supported Standing balance-Leahy Scale: Poor                               Pertinent Vitals/Pain Pain Assessment Pain Assessment: Faces Faces Pain Scale: Hurts even more Pain Location: L chest/rib cage Pain Descriptors / Indicators: Burning, Discomfort, Grimacing Pain Intervention(s): Monitored during session    Home Living Family/patient expects to be discharged to:: Private residence Living Arrangements:  Spouse/significant other Available Help at Discharge: Family;Personal care attendant;Available 24 hours/day Type of Home: House Home Access: Level entry       Home Layout: One level Home Equipment: Rolling Walker (2 wheels);Grab bars - tub/shower;Shower seat - built in;Toilet riser Additional Comments: pt has 24/7  caregiver for himself and his wife that assists with all care    Prior Function Prior Level of Function : Needs assist             Mobility Comments: pt was amb with RW with supervision, pt able to take self to bathroom, no driving, no cooking       Hand Dominance   Dominant Hand: Right    Extremity/Trunk Assessment   Upper Extremity Assessment Upper Extremity Assessment: Generalized weakness    Lower Extremity Assessment Lower Extremity Assessment: Generalized weakness    Cervical / Trunk Assessment Cervical / Trunk Assessment: Kyphotic;Other exceptions Cervical / Trunk Exceptions: Rib fracture and chest tube  Communication   Communication: HOH  Cognition Arousal/Alertness: Awake/alert Behavior During Therapy: WFL for tasks assessed/performed Overall Cognitive Status: No family/caregiver present to determine baseline cognitive functioning                                 General Comments: Follows all simple commands, oriented to self, place, time and situation.        General Comments General comments (skin integrity, edema, etc.): vss on 2L    Exercises     Assessment/Plan    PT Assessment Patient needs continued PT services  PT Problem List Decreased strength;Decreased activity tolerance;Decreased mobility;Decreased coordination;Cardiopulmonary status limiting activity;Pain       PT Treatment Interventions DME instruction;Gait training;Stair training;Functional mobility training;Therapeutic activities;Balance training;Patient/family education    PT Goals (Current goals can be found in the Care Plan section)  Acute Rehab PT Goals Patient Stated Goal: home when appropriate PT Goal Formulation: With patient Time For Goal Achievement: 01/26/22 Potential to Achieve Goals: Good    Frequency Min 3X/week     Co-evaluation PT/OT/SLP Co-Evaluation/Treatment: Yes Reason for Co-Treatment: For patient/therapist safety PT goals addressed during  session: Mobility/safety with mobility         AM-PAC PT "6 Clicks" Mobility  Outcome Measure Help needed turning from your back to your side while in a flat bed without using bedrails?: A Lot Help needed moving from lying on your back to sitting on the side of a flat bed without using bedrails?: A Lot Help needed moving to and from a bed to a chair (including a wheelchair)?: Total Help needed standing up from a chair using your arms (e.g., wheelchair or bedside chair)?: Total Help needed to walk in hospital room?: Total Help needed climbing 3-5 steps with a railing? : Total 6 Click Score: 8    End of Session   Activity Tolerance: Patient tolerated treatment well Patient left: in bed;with call bell/phone within reach;with bed alarm set Nurse Communication: Mobility status PT Visit Diagnosis: Other abnormalities of gait and mobility (R26.89);Muscle weakness (generalized) (M62.81);Difficulty in walking, not elsewhere classified (R26.2)    Time: 1540-0867 PT Time Calculation (min) (ACUTE ONLY): 19 min   Charges:   PT Evaluation $PT Eval Moderate Complexity: 1 Mod          01/12/2022  Jacinto Halim., PT Acute Rehabilitation Services [phone]  (pager) [phone]  (office)  Eliseo Gum Kaena Santori 01/12/2022, 6:15 PM

## 2022-01-12 NOTE — TOC Progression Note (Signed)
Transition of Care [facility]) - Progression Note    Patient Details  Name: Eugene [name] MRN: [id number] Date of Birth: 11-11-1935  [facility]) CM/SW Contact  Beckie Busing, RN Phone [phone]  01/12/2022, 8:52 AM  Clinical Narrative:    TOC acknowledges general consult  for Home Health / DME Needs. Patient is currently active with [facility] for home health for nursing. TOC will follow for further recommendations.        Expected Discharge Plan and Services                                                 Social Determinants of Health (SDOH) Interventions    Readmission Risk Interventions     No data to display

## 2022-01-12 NOTE — Progress Notes (Signed)
Initial Nutrition Assessment  DOCUMENTATION CODES:   Non-severe (moderate) malnutrition in context of chronic illness  INTERVENTION:   Boost Breeze PO TID, each supplement provides 250 kcal and 9 grams of protein. Liberalize diet to regular. MVI with minerals PO daily.  NUTRITION DIAGNOSIS:   Moderate Malnutrition related to chronic illness (CHF) as evidenced by mild fat depletion, moderate fat depletion, mild muscle depletion, moderate muscle depletion.  GOAL:   Patient will meet greater than or equal to 90% of their needs  MONITOR:   PO intake, Supplement acceptance, Skin, Labs  REASON FOR ASSESSMENT:   Consult Assessment of nutrition requirement/status  ASSESSMENT:   86 yo male admitted with L pleural effusion S/P fall at home ~1 week PTA. PMH includes CHF, A fib, former smoker, CVA, BLE edema, HTN, HLD, hemochromatosis, IBS.  Discussed patient in ICU rounds and with RN today. Chest tube placed on admission with 2 L bloody output.  Per discussion with patient and his family at bedside, he has never had a big appetite. He eats 3 meals per day at home. His wife has dementia, and his meals are not always large. His weight fluctuates with fluid status (legs swell a lot). He drinks Ensure Plus and/or Boost Plus sometimes. Boost seems to make his stomach upset more than Ensure d/t IBS. He agreed to try Boost Breeze clear liquid supplement. He may tolerate this better. He tried the [employer] and liked it. Will order TID between meals to maximize intake of protein and calories.   Labs reviewed. Phos 4.8  Medications reviewed and include Colace, Flomax, omeprazole.  Weight history reviewed. 5% weight loss over the past year is not significant for the time frame.   NUTRITION - FOCUSED PHYSICAL EXAM:  Flowsheet Row Most Recent Value  Orbital Region Mild depletion  Upper Arm Region Moderate depletion  Thoracic and Lumbar Region Moderate depletion  Buccal Region  Mild depletion  Temple Region Moderate depletion  Clavicle Bone Region Moderate depletion  Clavicle and Acromion Bone Region Moderate depletion  Scapular Bone Region Mild depletion  Dorsal Hand Mild depletion  Patellar Region Mild depletion  Anterior Thigh Region Mild depletion  Posterior Calf Region Unable to assess  Edema (RD Assessment) Moderate  Hair Reviewed  Eyes Reviewed  Mouth Reviewed  Skin Reviewed  Nails Reviewed       Diet Order:   Diet Order             Diet Heart Room service appropriate? Yes; Fluid consistency: Thin  Diet effective now                   EDUCATION NEEDS:   No education needs have been identified at this time  Skin:  Skin Assessment: Skin Integrity Issues: Skin Integrity Issues:: DTI DTI: coccyx  Last BM:  no BM documented  Height:   Ht Readings from Last 1 Encounters:  01/11/22 6' (1.829 m)    Weight:   Wt Readings from Last 1 Encounters:  01/12/22 83.6 kg    Ideal Body Weight:  80.9 kg  BMI:  Body mass index is 25 kg/m.  Estimated Nutritional Needs:   Kcal:  2000-2300  Protein:  100-120 gm  Fluid:  2-2.2 L   Gabriel Rainwater RD, LDN, CNSC Please refer to Amion for contact information.

## 2022-01-13 ENCOUNTER — Inpatient Hospital Stay (HOSPITAL_COMMUNITY): Payer: Medicare Other

## 2022-01-13 DIAGNOSIS — Z7901 Long term (current) use of anticoagulants: Secondary | ICD-10-CM

## 2022-01-13 DIAGNOSIS — Z4682 Encounter for fitting and adjustment of non-vascular catheter: Secondary | ICD-10-CM | POA: Diagnosis not present

## 2022-01-13 DIAGNOSIS — R5381 Other malaise: Secondary | ICD-10-CM

## 2022-01-13 DIAGNOSIS — J90 Pleural effusion, not elsewhere classified: Secondary | ICD-10-CM | POA: Diagnosis not present

## 2022-01-13 DIAGNOSIS — N189 Chronic kidney disease, unspecified: Secondary | ICD-10-CM

## 2022-01-13 DIAGNOSIS — D62 Acute posthemorrhagic anemia: Secondary | ICD-10-CM | POA: Diagnosis not present

## 2022-01-13 DIAGNOSIS — N179 Acute kidney failure, unspecified: Secondary | ICD-10-CM

## 2022-01-13 LAB — TYPE AND SCREEN
ABO/RH(D): O POS
Antibody Screen: NEGATIVE
Unit division: 0
Unit division: 0
Unit division: 0

## 2022-01-13 LAB — BPAM RBC
Blood Product Expiration Date: 202309182359
Blood Product Expiration Date: 202309182359
Blood Product Expiration Date: 202309212359
ISSUE DATE / TIME: 202308171630
ISSUE DATE / TIME: 202308171630
ISSUE DATE / TIME: 202308181144
Unit Type and Rh: 5100
Unit Type and Rh: 5100
Unit Type and Rh: 5100

## 2022-01-13 LAB — CBC
HCT: 31.5 % — ABNORMAL LOW (ref 39.0–52.0)
Hemoglobin: 10.7 g/dL — ABNORMAL LOW (ref 13.0–17.0)
MCH: 31.6 pg (ref 26.0–34.0)
MCHC: 34 g/dL (ref 30.0–36.0)
MCV: 92.9 fL (ref 80.0–100.0)
Platelets: 149 10*3/uL — ABNORMAL LOW (ref 150–400)
RBC: 3.39 MIL/uL — ABNORMAL LOW (ref 4.22–5.81)
RDW: 15.1 % (ref 11.5–15.5)
WBC: 8.9 10*3/uL (ref 4.0–10.5)
nRBC: 0 % (ref 0.0–0.2)

## 2022-01-13 LAB — BASIC METABOLIC PANEL
Anion gap: 8 (ref 5–15)
BUN: 45 mg/dL — ABNORMAL HIGH (ref 8–23)
CO2: 27 mmol/L (ref 22–32)
Calcium: 8.1 mg/dL — ABNORMAL LOW (ref 8.9–10.3)
Chloride: 100 mmol/L (ref 98–111)
Creatinine, Ser: 1.88 mg/dL — ABNORMAL HIGH (ref 0.61–1.24)
GFR, Estimated: 35 mL/min — ABNORMAL LOW (ref >60)
Glucose, Bld: 105 mg/dL — ABNORMAL HIGH (ref 70–99)
Potassium: 3.4 mmol/L — ABNORMAL LOW (ref 3.5–5.1)
Sodium: 135 mmol/L (ref 135–145)

## 2022-01-13 LAB — MAGNESIUM: Magnesium: 2 mg/dL (ref 1.7–2.4)

## 2022-01-13 MED ORDER — METOPROLOL TARTRATE 25 MG PO TABS
25.0000 mg | ORAL_TABLET | Freq: Two times a day (BID) | ORAL | Status: DC
  Administered 2022-01-13 – 2022-01-17 (×8): 25 mg via ORAL
  Filled 2022-01-13 (×8): qty 1

## 2022-01-13 MED ORDER — POTASSIUM CHLORIDE CRYS ER 20 MEQ PO TBCR
40.0000 meq | EXTENDED_RELEASE_TABLET | Freq: Once | ORAL | Status: AC
Start: 2022-01-13 — End: 2022-01-13
  Administered 2022-01-13: 40 meq via ORAL
  Filled 2022-01-13: qty 2

## 2022-01-13 NOTE — Progress Notes (Signed)
Pt transferred to to 6N18 per bed with personal belongings. Charge RN, Carollee Herter called pts daughter and updated family on transfer to floor.

## 2022-01-13 NOTE — Progress Notes (Signed)
NAME:  Eugene [name], MRN:  [id number], DOB:  1935/07/04, LOS: 2 ADMISSION DATE:  01/11/2022, CONSULTATION DATE:  8/17 REFERRING MD:  Myrene Buddy , CHIEF COMPLAINT:  pleural effusion  History of Present Illness:  Eugene [name] is  86 y.o.  man with chronic biventricular heart failure EF 50%, permanent atrial fibrillation on eliquis, AAA s/p endovascular repair who presents for shortness of breath of one weeks duration. He takes prn lasix for lower extremity edema.  He lives at home with his wife and is independent with ADLs.  Had a fall a week ago landing on left rib cage, but the shortness of breath started before this.  Former smoker 40+ pack years but quit 20 years ago.  Denies fevers chills night sweats, weight loss. Says he's never had a big appetite. No nausea vomiting diarrhea.  He took his morning meds including his eliquis today.    Pertinent  Medical History  CHF,  AAA s/p repair Permanent A. Fib seizures on keppra (possibly provoked from previous UTI) History of CVA Aortic valve calcifications  Significant Hospital Events: Including procedures, antibiotic start and stop dates in addition to other pertinent events   8/17 admitted to ICU, chest tube placed, 2 units pRBCs 8/18 1 pRBC for ongoing hypotension  Interim History / Subjective:  Still having some pain with movement from chest tube, but not at rest. Breathing is improved.  Objective   Blood pressure 111/62, pulse 99, temperature 98.4 F (36.9 C), temperature source Oral, resp. rate 15, height 6' (1.829 m), weight 84 kg, SpO2 96 %.        Intake/Output Summary (Last 24 hours) at 01/13/2022 1310 Last data filed at 01/13/2022 1000 Gross per 24 hour  Intake 924.5 ml  Output 2040 ml  Net -1115.5 ml    Filed Weights   01/11/22 2200 01/12/22 0500 01/13/22 0457  Weight: 83.6 kg 83.6 kg 84 kg    Examination: General: elderly man sitting up in bed in NAD HENT: [place]/AT, eyes anicteric Lungs: Left-sided  chest tube with improving bloody output, fluid looking more serous in the tubing.  Some clots in the tubing.  CTAB.  Speaking in full sentences.  No tachypnea or conversational dyspnea. Cardiovascular: S1-S2, minimally tachycardic, irregular rhythm Abdomen: Soft, nontender, nondistended Extremities: Bilateral lower extremity edema, chronic venous stasis skin changes Neuro: Awake and alert, moving all extremities, answering questions appropriately.  Normal speech. Derm: No diffuse rashes  CXR personally reviewed - tube in appropriate position, minimal residual left effusion, possibly left lung contusion.  CT output: 500cc/24 h  ( at 1120cc at 1pm)  BUN 45 Cr 1.88 H/H 10.7/31.5 Platelets 149  Resolved Hospital Problem list     Assessment & Plan:   Left hemothorax from fall on [facility] with rib fractures Acute respiratory failure with hypoxia resolved Concern for OSA -Continue daily CXR - Continue chest tube for suction. No indication for surgical intervention at this time. - Daily CBC -Recommend against resuming anticoagulation.  Would defer to PCP and cardiologist as an outpatient to determine bleeding versus clotting risks and evaluate his priorities.. -Needs outpatient overnight oximetry to assess for nocturnal hypoxia.  He has refused sleep studies in the past and would not wear CPAP due to claustrophobia.  Acute blood loss anemia secondary to above, s/p 3 units pRBCs, Kcentra Chronic anticoagulation  -Daily CBC - No current indications for blood transfusion  Acute thrombocytopenia, likely consumptive - Monitor  AKI likely 2/2 prolonged hypoperfusion from hemothorax improving -Strict I's/O -  Renally dose meds and avoid nephrotoxic medications - Monitor  Hypokalemia - Repleted - Monitor  Chronic HFpEF Permanent Atrial Fibrillation on Eliquis - Eliquis being held due to bleeding. -half dose metoprolol due to soft Bps -Telemetry monitoring  H/o syncope/seizure-like  activity -con't keppra; neuro recommends to con't indefinitely  Mood d/o -Continue lorazepam, trazodone, Seroquel   Daughter and sister updated at bedside. Stable to transfer to the floor this afternoon, to [facility] tomorrow. PCCM will con't to follow for chest tube.  Best Practice (right click and "Reselect all SmartList Selections" daily)   Diet/type: Regular consistency (see orders) DVT prophylaxis: SCD GI prophylaxis: N/A Lines: N/A Foley:  N/A Code Status:  DNR Last date of multidisciplinary goals of care discussion [had with daughter stephanie and patient - patient is DNR but open to other supportive measures. ]   Eugene Dunn, DO 01/13/22 1:44 PM [facility]

## 2022-01-14 DIAGNOSIS — J942 Hemothorax: Secondary | ICD-10-CM | POA: Diagnosis not present

## 2022-01-14 DIAGNOSIS — Z4682 Encounter for fitting and adjustment of non-vascular catheter: Secondary | ICD-10-CM | POA: Diagnosis not present

## 2022-01-14 DIAGNOSIS — J90 Pleural effusion, not elsewhere classified: Secondary | ICD-10-CM | POA: Diagnosis not present

## 2022-01-14 LAB — BASIC METABOLIC PANEL
Anion gap: 8 (ref 5–15)
BUN: 29 mg/dL — ABNORMAL HIGH (ref 8–23)
CO2: 28 mmol/L (ref 22–32)
Calcium: 8.6 mg/dL — ABNORMAL LOW (ref 8.9–10.3)
Chloride: 100 mmol/L (ref 98–111)
Creatinine, Ser: 1.46 mg/dL — ABNORMAL HIGH (ref 0.61–1.24)
GFR, Estimated: 47 mL/min — ABNORMAL LOW (ref >60)
Glucose, Bld: 107 mg/dL — ABNORMAL HIGH (ref 70–99)
Potassium: 3.7 mmol/L (ref 3.5–5.1)
Sodium: 136 mmol/L (ref 135–145)

## 2022-01-14 LAB — CBC
HCT: 32.5 % — ABNORMAL LOW (ref 39.0–52.0)
Hemoglobin: 11 g/dL — ABNORMAL LOW (ref 13.0–17.0)
MCH: 31.7 pg (ref 26.0–34.0)
MCHC: 33.8 g/dL (ref 30.0–36.0)
MCV: 93.7 fL (ref 80.0–100.0)
Platelets: 168 10*3/uL (ref 150–400)
RBC: 3.47 MIL/uL — ABNORMAL LOW (ref 4.22–5.81)
RDW: 15.1 % (ref 11.5–15.5)
WBC: 7.6 10*3/uL (ref 4.0–10.5)
nRBC: 0 % (ref 0.0–0.2)

## 2022-01-14 LAB — MAGNESIUM: Magnesium: 2.1 mg/dL (ref 1.7–2.4)

## 2022-01-14 NOTE — Plan of Care (Signed)

## 2022-01-14 NOTE — Progress Notes (Signed)
   NAME:  Eugene [name], MRN:  [id number], DOB:  December 14, 1935, LOS: 3 ADMISSION DATE:  01/11/2022, CONSULTATION DATE:  8/17 REFERRING MD:  Myrene Buddy , CHIEF COMPLAINT:  pleural effusion  History of Present Illness:  86 y/o M with chronic biventricular heart failure EF 50%, permanent atrial fibrillation on eliquis, AAA s/p endovascular repair who presents for shortness of breath of one weeks duration. He takes prn lasix for lower extremity edema.  He lives at home with his wife and is independent with ADLs.  Larey Seat one week ago landing on left rib cage, but the shortness of breath started before this.  Former smoker 40+ pack years but quit 20 years ago.   Pertinent  Medical History  CHF AAA s/p repair Permanent A. Fib seizures on keppra (possibly provoked from previous UTI) History of CVA Aortic valve calcifications  Significant Hospital Events: Including procedures, antibiotic start and stop dates in addition to other pertinent events   8/17 admitted to ICU, chest tube placed, 2 units pRBCs 8/18 1 pRBC for ongoing hypotension 8/19 Pain with movement from chest tube but not at rest, breathing improved  Interim History / Subjective:  Afebrile  Pt asking for lunch tray  Pt reports he feels bad   Objective   Blood pressure 123/79, pulse (!) 106, temperature 98.3 F (36.8 C), temperature source Oral, resp. rate 17, height 6' (1.829 m), weight 86.8 kg, SpO2 94 %.        Intake/Output Summary (Last 24 hours) at 01/14/2022 1445 Last data filed at 01/14/2022 1104 Gross per 24 hour  Intake 820 ml  Output 1080 ml  Net -260 ml   Filed Weights   01/12/22 0500 01/13/22 0457 01/14/22 0500  Weight: 83.6 kg 84 kg 86.8 kg    Examination: General: elderly adult male lying in bed in NAD HEENT: MM pink/moist, anicteric  Neuro: Awake/alert, communicates appropriately, MAE  CV: s1s2 RRR, no m/r/g PULM: non-labored at rest, lungs bilaterally clear, left chest tube in place > 1450 total in  [place], approx 50 mls out since last marked, no leak GI: soft, bsx4 active  Extremities: warm/dry, no edema  Skin: no rashes or lesions  Resolved Hospital Problem list     Assessment & Plan:   Left Hemothorax s/p Mechanical Fall on [facility] with Rib Fractures Acute Respiratory Failure with Hypoxia - resolved Concern for OSA -follow daily CXR while CT in place  -chest tube care per protocol  -continue chest tube to -20 cm suction  -may be nearing complete evacuation of hemothorax, consider removal in am pending CXR review (position of chest tube noted) -would continue to hold anticoagulation until seen as outpatient by PCP / Cardiology to evaluate bleeding vs clotting risks -will need outpatient overnight oximetry.  Has refused sleep studies in past / can not tolerate CPAP due to claustrophobia  Issues Below per TRH  Acute blood loss anemia secondary to above, s/p 3 units pRBCs, Kcentra Chronic anticoagulation  Acute thrombocytopenia, likely consumptive AKI likely 2/2 prolonged hypoperfusion from hemothorax improving Hypokalemia Chronic HFpEF Permanent Atrial Fibrillation on Eliquis Hx syncope/seizure-like activity Mood Disorder  Best Practice (right click and "Reselect all SmartList Selections" daily)  Per TRH      Canary Brim, MSN, APRN, NP-C, AGACNP-BC [facility] 01/14/2022, 2:45 PM   Please see [email or web address] for pager details.   From 7A-7P if no response, please call [phone] After hours, please call ELink [phone]

## 2022-01-14 NOTE — Progress Notes (Signed)
PROGRESS NOTE   Eugene [name]  HUT:[id number] DOB: Feb 05, 1936 DOA: 01/11/2022 PCP: [name], MD  Brief Narrative:   86 year old white male known history HTN HLD CVA 2007 and AAA status post stent  NSVT/A-fib CHADVASC >3+ cardiomegaly HFpEF last echo 50-55% 4/23 prior seizures on 08/28/2021 admission BPH Hemochromatosis diagnosed in 1988 now follows with Dr. Leonides Schanz Prior severe hyponatremia   8/17 admit to ICU secondary to fall landing on left rib cage found to have hemothorax-, 2unit PRBC 8/18 transfused 1 more unit PRBC  Hospital-Problem based course  Fall resulting in hemothorax Chest tube being managed by critical care X-ray from 8/20 = small left pleural effusion and fractured ribs Acute blood loss into left hemothorax Transfused 3 times this admission hemoglobin stable at this time NSVT A-fib CHADVASC 2 > 3 Not really a candidate for anticoagulation outpatient decision with cardiology on follow-up Await PT input prior to finalization of this decision Rate is controlled on metoprolol 25 twice daily HFpEF last echo 50-55% Holding Lasix 40 daily-metoprolol as above Resume Altace 5 mg closer to discharge Depression Resumed Seroquel 2012.5 at bedtime, Xanax 0.5 twice daily Prior seizures 08/2021 Outpatient follow-up with neurology-continue Keppra 500 twice daily  DVT prophylaxis: SCD given bleed Code Status: Full Family Communication: None present at this time Disposition:  Status is: Inpatient Remains inpatient appropriate because:   Await therapy reeval-chest tube still in place will need to have this removed prior to discharge   Consultants:    Procedures:   Antimicrobials:     Subjective: Awake coherent pleasant no distress Pain is moderately controlled Eating drinking  Objective: Vitals:   01/13/22 2100 01/13/22 2252 01/14/22 0430 01/14/22 0500  BP: 130/83 103/66 139/80   Pulse: (!) 120 (!) 106 (!) 104   Resp: 14 18 18    Temp:  98.3 F  (36.8 C) 98.1 F (36.7 C)   TempSrc:  Oral Oral   SpO2: 97% 91% 93%   Weight:    86.8 kg  Height:        Intake/Output Summary (Last 24 hours) at 01/14/2022 0742 Last data filed at 01/14/2022 0701 Gross per 24 hour  Intake 1720 ml  Output 1930 ml  Net -210 ml   Filed Weights   01/12/22 0500 01/13/22 0457 01/14/22 0500  Weight: 83.6 kg 84 kg 86.8 kg    Examination:  EOMI NCAT, no icterus no pallor no JVD No bruit S1-S2 no murmurs Chest tube in place on left side No lower extremity edema ROM intact  Data Reviewed: personally reviewed   CBC    Component Value Date/Time   WBC 7.6 01/14/2022 0155   RBC 3.47 (L) 01/14/2022 0155   HGB 11.0 (L) 01/14/2022 0155   HGB 12.9 (L) 10/25/2021 1247   HGB 12.1 (L) 04/09/2017 1308   HCT 32.5 (L) 01/14/2022 0155   HCT 36.1 (L) 04/09/2017 1308   PLT 168 01/14/2022 0155   PLT 196 10/25/2021 1247   PLT 241 04/09/2017 1308   MCV 93.7 01/14/2022 0155   MCV 98.3 (H) 04/09/2017 1308   MCH 31.7 01/14/2022 0155   MCHC 33.8 01/14/2022 0155   RDW 15.1 01/14/2022 0155   RDW 12.8 04/09/2017 1308   LYMPHSABS 0.6 (L) 01/11/2022 1130   LYMPHSABS 1.1 04/09/2017 1308   MONOABS 0.7 01/11/2022 1130   MONOABS 0.6 04/09/2017 1308   EOSABS 0.0 01/11/2022 1130   EOSABS 0.2 04/09/2017 1308   BASOSABS 0.0 01/11/2022 1130   BASOSABS 0.0 04/09/2017 1308  Latest Ref Rng & Units 01/14/2022    1:55 AM 01/13/2022   12:14 AM 01/12/2022    7:31 AM  CMP  Glucose 70 - 99 mg/dL 235  361  90   BUN 8 - 23 mg/dL 29  45  47   Creatinine 0.61 - 1.24 mg/dL 4.43  1.54  0.08   Sodium 135 - 145 mmol/L 136  135  136   Potassium 3.5 - 5.1 mmol/L 3.7  3.4  4.0   Chloride 98 - 111 mmol/L 100  100  99   CO2 22 - 32 mmol/L 28  27  27    Calcium 8.9 - 10.3 mg/dL 8.6  8.1  8.2      Radiology Studies: DG CHEST PORT 1 VIEW  Result Date: 01/13/2022 CLINICAL DATA:  Shortness of breath and chest pain. EXAM: PORTABLE CHEST 1 VIEW COMPARISON:  01/11/2022 FINDINGS: The  left chest tube is again noted overlying the left lower lung. Small residual left pleural effusion is unchanged. No pneumothorax. Stable cardiomediastinal contours. There are asymmetric opacities in the left midlung and left base which appears similar to previous exam. Right lung is clear. Left rib fractures are again noted. IMPRESSION: 1. No change in aeration a left midlung and left base. 2. Stable left chest tube. No pneumothorax. Small residual left pleural effusion is unchanged. Electronically Signed   By: 01/13/2022 M.D.   On: 01/13/2022 08:20   ECHOCARDIOGRAM LIMITED  Result Date: 01/12/2022    ECHOCARDIOGRAM LIMITED REPORT   Patient Name:   Eugene [name] Date of Exam: 01/12/2022 Medical Rec #:  01/14/2022          Height:       72.0 in Accession #:    [id number]         Weight:       184.3 lb Date of Birth:  1935/12/16          BSA:          2.058 m Patient Age:    86 years           BP:           112/62 mmHg Patient Gender: M                  HR:           112 bpm. Exam Location:  Inpatient Procedure: Limited Echo, Color Doppler and Cardiac Doppler Indications:    Elevated Troponins  History:        Patient has prior history of Echocardiogram examinations, most                 recent 08/25/2021. Arrythmias:Atrial Fibrillation; Risk                 Factors:Hypertension and Dyslipidemia.  Sonographer:    08/27/2021 Senior RDCS Referring Phys: [id number] JENNIFER YATES  Sonographer Comments: Scanned upright with left sided chest tube in place. IMPRESSIONS  1. Left ventricular ejection fraction, by estimation, is 60 to 65%. The left ventricle has normal function.  2. Right ventricular systolic function is normal. The right ventricular size is normal.  3. No evidence of mitral valve regurgitation.  4. The aortic valve is calcified. Conclusion(s)/Recommendation(s): LV function is stable. Cannot fully assess PASP. FINDINGS  Left Ventricle: Left ventricular ejection fraction, by estimation, is 60 to 65%. The left  ventricle has normal function. Right Ventricle: The right ventricular size is normal. Right ventricular systolic function is normal. Pericardium:  Trivial pericardial effusion is present. Tricuspid Valve: Tricuspid valve regurgitation is mild. Aortic Valve: The aortic valve is calcified. TRICUSPID VALVE TR Peak grad:   40.7 mmHg TR Vmax:        319.00 cm/s Carolan Clines Electronically signed by Carolan Clines Signature Date/Time: 01/12/2022/9:51:25 AM    Final      Scheduled Meds:  ALPRAZolam  0.5 mg Oral BID   atorvastatin  20 mg Oral Daily   docusate sodium  100 mg Oral BID   feeding supplement  1 Container Oral TID BM   levETIRAcetam  500 mg Oral BID   metoprolol tartrate  25 mg Oral BID   multivitamin with minerals  1 tablet Oral Daily   omeprazole  20 mg Oral Daily   QUEtiapine  25 mg Oral QHS   sodium chloride flush  3 mL Intravenous Q12H   tamsulosin  0.4 mg Oral QHS   Continuous Infusions:   LOS: 3 days   Time spent: 86  Rhetta Mura, MD [facility] To contact the attending provider between 7A-7P or the covering provider during after hours 7P-7A, please log into the web site [email or web address] and access using universal [place] password for that web site. If you do not have the password, please call the hospital operator.  01/14/2022, 7:42 AM

## 2022-01-15 ENCOUNTER — Inpatient Hospital Stay (HOSPITAL_COMMUNITY): Payer: Medicare Other

## 2022-01-15 ENCOUNTER — Telehealth: Payer: Self-pay | Admitting: Pulmonary Disease

## 2022-01-15 ENCOUNTER — Ambulatory Visit (HOSPITAL_COMMUNITY)
Admission: RE | Admit: 2022-01-15 | Payer: Medicare Other | Source: Ambulatory Visit | Attending: Physician Assistant | Admitting: Physician Assistant

## 2022-01-15 DIAGNOSIS — J90 Pleural effusion, not elsewhere classified: Secondary | ICD-10-CM | POA: Diagnosis not present

## 2022-01-15 LAB — BODY FLUID CULTURE W GRAM STAIN: Culture: NO GROWTH

## 2022-01-15 LAB — CBC
HCT: 32.7 % — ABNORMAL LOW (ref 39.0–52.0)
Hemoglobin: 11.3 g/dL — ABNORMAL LOW (ref 13.0–17.0)
MCH: 32.4 pg (ref 26.0–34.0)
MCHC: 34.6 g/dL (ref 30.0–36.0)
MCV: 93.7 fL (ref 80.0–100.0)
Platelets: 185 10*3/uL (ref 150–400)
RBC: 3.49 MIL/uL — ABNORMAL LOW (ref 4.22–5.81)
RDW: 15.1 % (ref 11.5–15.5)
WBC: 8.3 10*3/uL (ref 4.0–10.5)
nRBC: 0 % (ref 0.0–0.2)

## 2022-01-15 LAB — BASIC METABOLIC PANEL
Anion gap: 9 (ref 5–15)
BUN: 23 mg/dL (ref 8–23)
CO2: 25 mmol/L (ref 22–32)
Calcium: 8.4 mg/dL — ABNORMAL LOW (ref 8.9–10.3)
Chloride: 101 mmol/L (ref 98–111)
Creatinine, Ser: 1.54 mg/dL — ABNORMAL HIGH (ref 0.61–1.24)
GFR, Estimated: 44 mL/min — ABNORMAL LOW (ref >60)
Glucose, Bld: 111 mg/dL — ABNORMAL HIGH (ref 70–99)
Potassium: 4 mmol/L (ref 3.5–5.1)
Sodium: 135 mmol/L (ref 135–145)

## 2022-01-15 LAB — MAGNESIUM: Magnesium: 2 mg/dL (ref 1.7–2.4)

## 2022-01-15 NOTE — Significant Event (Addendum)
Rapid Response Event Note   Called to remove pt CT. Order verified and CT removed and occlusive dressing placed. Pt tolerated well, no SOB or distress noted. Pt sat up in bed to eat dinner prior to my leaving. Instructed to inform staff of any new pain or difficulty breathing. Primary RN notified to call with any changes or concerns.    Mordecai Rasmussen, RN

## 2022-01-15 NOTE — Care Management Important Message (Signed)
Important Message  Patient Details  Name: SUHAAN PERLEBERG MRN: [id number] Date of Birth: 01/01/1936   Medicare Important Message Given:  Yes     Sherilyn Banker 01/15/2022, 12:28 PM

## 2022-01-15 NOTE — Plan of Care (Signed)
  Problem: Health Behavior/Discharge Planning: Goal: Ability to manage health-related needs will improve Outcome: Progressing   

## 2022-01-15 NOTE — Progress Notes (Addendum)
   NAME:  Eugene [name], MRN:  [id number], DOB:  [birth year], LOS: 4 ADMISSION DATE:  01/11/2022, CONSULTATION DATE:  8/17 REFERRING MD:  Myrene Buddy , CHIEF COMPLAINT:  pleural effusion  History of Present Illness:  86 y/o M with chronic biventricular heart failure EF 50%, permanent atrial fibrillation on eliquis, AAA s/p endovascular repair who presents for shortness of breath of one weeks duration. He takes prn lasix for lower extremity edema.  He lives at home with his wife and is independent with ADLs.  Larey Seat one week ago landing on left rib cage, but the shortness of breath started before this.  Former smoker 40+ pack years but quit 20 years ago.   Pertinent  Medical History  CHF AAA s/p repair Permanent A. Fib seizures on keppra (possibly provoked from previous UTI) History of CVA Aortic valve calcifications  Significant Hospital Events: Including procedures, antibiotic start and stop dates in addition to other pertinent events   8/17 admitted to ICU, chest tube placed, 2 units pRBCs 8/18 1 pRBC for ongoing hypotension 8/19 Pain with movement from chest tube but not at rest, breathing improved  Interim History / Subjective:   Pain at chest tube site.  Objective   Blood pressure 133/82, pulse (!) 104, temperature 97.7 F (36.5 C), temperature source Oral, resp. rate 16, height 6' (1.829 m), weight 86.8 kg, SpO2 94 %.        Intake/Output Summary (Last 24 hours) at 01/15/2022 0827 Last data filed at 01/15/2022 0700 Gross per 24 hour  Intake 723 ml  Output 925 ml  Net -202 ml   Filed Weights   01/12/22 0500 01/13/22 0457 01/14/22 0500  Weight: 83.6 kg 84 kg 86.8 kg    Examination: General: Elderly gentleman, comfortable in bed HEENT: NCAT, tracking appropriately Neuro: Awake alert following commands CV: Regular rhythm, S1-S2 PULM: Chest tube in place, no leak, clear to auscultation diminished in the left side compared to the right GI: Soft nontender  nondistended Extremities: No significant edema Skin: No rash  Resolved Hospital Problem list     Assessment & Plan:   Left Hemothorax s/p Mechanical Fall on [facility] with Rib Fractures Acute Respiratory Failure with Hypoxia - resolved Concern for OSA Plan: Daily chest tube care Remains at -20 suction. Pull chest tube today. Repeat chest x-ray as outpatient in 4-6 weeks. We will arrange follow-up in pulmonary office. Can restart Eliquis at discharge.  Pulmonary will sign off at this time.  Do not hesitate to call with any questions.  Issues Below per TRH  Acute blood loss anemia secondary to above, s/p 3 units pRBCs, Kcentra Chronic anticoagulation  Acute thrombocytopenia, likely consumptive AKI likely 2/2 prolonged hypoperfusion from hemothorax improving Hypokalemia Chronic HFpEF Permanent Atrial Fibrillation on Eliquis Hx syncope/seizure-like activity Mood Disorder  Best Practice (right click and "Reselect all SmartList Selections" daily)  Per TRH      Josephine Igo, DO [place] [facility] 01/15/2022 8:27 AM

## 2022-01-15 NOTE — Telephone Encounter (Signed)
Patient is scheduled on 02/28/2022 at 11:30am for HFU with Rubye Oaks, NP. Reminder mailed to address on file. Nothing further needed.

## 2022-01-15 NOTE — Progress Notes (Signed)
PROGRESS NOTE   Eugene [name]  HUD:[id number] DOB: 07-16-1935 DOA: 01/11/2022 PCP: [name], MD  Brief Narrative:   86 year old white male known history HTN HLD CVA 2007 and AAA status post stent  NSVT/A-fib CHADVASC >3+ cardiomegaly HFpEF last echo 50-55% 4/23 prior seizures on 08/28/2021 admission BPH Hemochromatosis diagnosed in 1988 now follows with Dr. Leonides Schanz Prior severe hyponatremia   8/17 admit to ICU secondary to fall landing on left rib cage found to have hemothorax-, 2unit PRBC 8/18 transfused 1 more unit PRBC  Hospital-Problem based course  Fall resulting in hemothorax Chest tube to be pulled today according to critical care by nursing staff X-ray from 8/21 = shows left-sided tube with improved aeration left basilar airspace disease and lucency about tube?  Small loculated pneumothorax Repeat CXR in a.m. post removal Acute blood loss into left hemothorax Transfused 3 times this admission hemoglobin stable at this time NSVT A-fib CHADVASC 2 > 3 Not really a candidate for anticoagulation outpatient decision with cardiology on follow-up Rate is controlled on metoprolol 25 twice daily HFpEF last echo 50-55% Holding Lasix 40 daily-metoprolol as above Resume Altace 5 mg closer to discharge Depression Resumed Seroquel 2012.5 at bedtime, Xanax 0.5 twice daily Prior seizures 08/2021 Outpatient follow-up with neurology-continue Keppra 500 twice daily  DVT prophylaxis: SCD given bleed Code Status: Full Family Communication: None present at this time Disposition:  Status is: Inpatient Remains inpatient appropriate because:   Chest tube to be removed check x-ray in a.m. likely home with home health on discharge in a.m. 8/22   Consultants:    Procedures:   Antimicrobials:     Subjective:  Fair no distress no issues Overall looks well no fever no chills no nausea No cough  Objective: Vitals:   01/14/22 1553 01/14/22 1954 01/15/22 0425 01/15/22 0829   BP: 120/73 123/76 133/82 133/87  Pulse: 96 (!) 103 (!) 104 (!) 102  Resp: 17 16 16 17   Temp: 98.2 F (36.8 C) 98.2 F (36.8 C) 97.7 F (36.5 C) 98.6 F (37 C)  TempSrc: Oral Oral Oral Oral  SpO2: (!) 88% 96% 94% 95%  Weight:      Height:        Intake/Output Summary (Last 24 hours) at 01/15/2022 1356 Last data filed at 01/15/2022 0700 Gross per 24 hour  Intake 483 ml  Output 925 ml  Net -442 ml    Filed Weights   01/12/22 0500 01/13/22 0457 01/14/22 0500  Weight: 83.6 kg 84 kg 86.8 kg    Examination:  EOMI NCAT, no icterus no pallor no JVD No bruit S1-S2 no murmurs rub or gallop Chest tube in place on left side air entry bilaterally Abdomen is soft no rebound no guarding No lower extremity edema ROM intact  Data Reviewed: personally reviewed   CBC    Component Value Date/Time   WBC 8.3 01/15/2022 0150   RBC 3.49 (L) 01/15/2022 0150   HGB 11.3 (L) 01/15/2022 0150   HGB 12.9 (L) 10/25/2021 1247   HGB 12.1 (L) 04/09/2017 1308   HCT 32.7 (L) 01/15/2022 0150   HCT 36.1 (L) 04/09/2017 1308   PLT 185 01/15/2022 0150   PLT 196 10/25/2021 1247   PLT 241 04/09/2017 1308   MCV 93.7 01/15/2022 0150   MCV 98.3 (H) 04/09/2017 1308   MCH 32.4 01/15/2022 0150   MCHC 34.6 01/15/2022 0150   RDW 15.1 01/15/2022 0150   RDW 12.8 04/09/2017 1308   LYMPHSABS 0.6 (L) 01/11/2022 1130  LYMPHSABS 1.1 04/09/2017 1308   MONOABS 0.7 01/11/2022 1130   MONOABS 0.6 04/09/2017 1308   EOSABS 0.0 01/11/2022 1130   EOSABS 0.2 04/09/2017 1308   BASOSABS 0.0 01/11/2022 1130   BASOSABS 0.0 04/09/2017 1308      Latest Ref Rng & Units 01/15/2022    1:50 AM 01/14/2022    1:55 AM 01/13/2022   12:14 AM  CMP  Glucose 70 - 99 mg/dL 694  854  627   BUN 8 - 23 mg/dL 23  29  45   Creatinine 0.61 - 1.24 mg/dL 0.35  0.09  3.81   Sodium 135 - 145 mmol/L 135  136  135   Potassium 3.5 - 5.1 mmol/L 4.0  3.7  3.4   Chloride 98 - 111 mmol/L 101  100  100   CO2 22 - 32 mmol/L 25  28  27    Calcium  8.9 - 10.3 mg/dL 8.4  8.6  8.1      Radiology Studies: DG CHEST PORT 1 VIEW  Result Date: 01/15/2022 CLINICAL DATA:  An 86 year old male present dense for evaluation of LEFT-sided chest tube. EXAM: PORTABLE CHEST 1 VIEW COMPARISON:  January 13, 2022 FINDINGS: EKG leads project over the chest. Trachea it is midline accounting for slight rotation to the LEFT. Cardiomediastinal contours and hilar structures are stable. LEFT-sided chest tube remains in place. Improved aeration at the LEFT lung base. Improved interstitial and airspace opacities about the LEFT lung base since previous imaging. Persistent lucency about the LEFT-sided chest tube may reflect tiny basilar loculated pneumothorax about the chest tube. No apical or lateral component. RIGHT chest is clear. On limited assessment no acute skeletal findings. IMPRESSION: 1. LEFT-sided chest tube with improved aeration at the LEFT lung base. Still with some LEFT basilar airspace disease. 2. Persistent lucency about the LEFT-sided chest tube may reflect tiny basilar loculated pneumothorax about the chest tube. Attention on follow-up. Electronically Signed   By: January 15, 2022 M.D.   On: 01/15/2022 08:52     Scheduled Meds:  ALPRAZolam  0.5 mg Oral BID   atorvastatin  20 mg Oral Daily   docusate sodium  100 mg Oral BID   feeding supplement  1 Container Oral TID BM   levETIRAcetam  500 mg Oral BID   metoprolol tartrate  25 mg Oral BID   multivitamin with minerals  1 tablet Oral Daily   omeprazole  20 mg Oral Daily   QUEtiapine  25 mg Oral QHS   sodium chloride flush  3 mL Intravenous Q12H   tamsulosin  0.4 mg Oral QHS   Continuous Infusions:   LOS: 4 days   Time spent: 20  21, MD [facility] To contact the attending provider between 7A-7P or the covering provider during after hours 7P-7A, please log into the web site [email or web address] and access using universal [place] password for that web site. If you do not have  the password, please call the hospital operator.  01/15/2022, 1:56 PM

## 2022-01-15 NOTE — Telephone Encounter (Signed)
PCCM:  Patient needs hospital follow-up appointment with APP in 6 weeks for chest x-ray.  Josephine Igo, DO [facility] 01/15/2022 9:41 AM

## 2022-01-15 NOTE — Progress Notes (Signed)
Physical Therapy Treatment Patient Details Name: Eugene [name] MRN: [id number] DOB: July 20, 1935 Today's Date: 01/15/2022   History of Present Illness pt is an 86 y/o male admitted 8/17 with progressive SOB.  2 weeks ago fell on the edge of the bathtub and landed on his left ribcage.  Imaging showed Large L pleural effusion in the ED.  PMHx:  AAA, HLD, HTN, afib, stroke    PT Comments    Continuing work on functional mobility and activity tolerance;  Session focused on functional transfers, and pt is showing improvements compared to PT eval; Able to power up to stand with min steadying assist, and tolerating more activity; Still with L chest and ribs quite painful with movement, and with coughing; taught pillow splinting; Provided pt with incentive spiromenter, and instructed on how to use (tells me he will try after lunch);   Pt voiced concern at the notion of going home tomorrow; he is the caregiver for his wife, and wants to be much better at moving and walking than he is now; supportive family arrived towards end of session; he participates and shows improving activity tolerance; Worth considering AIR stay to maximize independence and safety with mobility and ADLs prior to dc home -- would like to AIR Adm Coordinator to weigh in -- did not have the opportunity to discuss possible AIR stay with pt yet   Recommendations for follow up therapy are one component of a multi-disciplinary discharge planning process, led by the attending physician.  Recommendations may be updated based on patient status, additional functional criteria and insurance authorization.  Follow Up Recommendations  Acute inpatient rehab (3hours/day)     Assistance Recommended at Discharge Intermittent Supervision/Assistance  Patient can return home with the following A little help with walking and/or transfers;A little help with bathing/dressing/bathroom;Assistance with cooking/housework;Direct supervision/assist for  medications management;Direct supervision/assist for financial management;Assist for transportation;Help with stairs or ramp for entrance   Equipment Recommendations  BSC/3in1    Recommendations for Other Services       Precautions / Restrictions Precautions Precautions: Fall;Other (comment) Precaution Comments: PE and rib fractures     Mobility  Bed Mobility Overal bed mobility: Needs Assistance Bed Mobility: Supine to Sit     Supine to sit: Mod assist     General bed mobility comments: Mod assist to elevate trunk to sit    Transfers Overall transfer level: Needs assistance Equipment used: Rolling walker (2 wheels) Transfers: Sit to/from Stand, Bed to chair/wheelchair/BSC Sit to Stand: Min assist, Mod assist   Step pivot transfers: Min assist, +2 safety/equipment       General transfer comment: Stood from elevated bed, and from [facility]; cues for hand placement; Mod assist to control descent to sit onto [facility]; Able to take pivot steps bed to [facility], and then BSC to recliner    Ambulation/Gait                   Stairs             Wheelchair Mobility    Modified Rankin (Stroke Patients Only)       Balance     Sitting balance-Leahy Scale: Good       Standing balance-Leahy Scale: Poor                              Cognition Arousal/Alertness: Awake/alert Behavior During Therapy: WFL for tasks assessed/performed Overall Cognitive Status: Within Functional Limits for tasks assessed  Exercises      General Comments General comments (skin integrity, edema, etc.): NAD on RA; noted pressure area on bil buttocks; RN aware      Pertinent Vitals/Pain Pain Assessment Pain Assessment: Faces Faces Pain Scale: Hurts even more Pain Location: L chest/rib cage Pain Descriptors / Indicators: Burning, Discomfort, Grimacing Pain Intervention(s): Monitored during session    Home Living                           Prior Function            PT Goals (current goals can now be found in the care plan section) Acute Rehab PT Goals Patient Stated Goal: home when appropriate PT Goal Formulation: With patient Time For Goal Achievement: 01/26/22 Potential to Achieve Goals: Good Progress towards PT goals: Progressing toward goals    Frequency    Min 3X/week      PT Plan Discharge plan needs to be updated    Co-evaluation              AM-PAC PT "6 Clicks" Mobility   Outcome Measure  Help needed turning from your back to your side while in a flat bed without using bedrails?: A Lot Help needed moving from lying on your back to sitting on the side of a flat bed without using bedrails?: A Lot Help needed moving to and from a bed to a chair (including a wheelchair)?: A Lot Help needed standing up from a chair using your arms (e.g., wheelchair or bedside chair)?: A Lot Help needed to walk in hospital room?: Total Help needed climbing 3-5 steps with a railing? : Total 6 Click Score: 10    End of Session   Activity Tolerance: Patient tolerated treatment well Patient left: in chair;with call bell/phone within reach;with chair alarm set;with family/visitor present (preparing to eat lunch) Nurse Communication: Mobility status PT Visit Diagnosis: Other abnormalities of gait and mobility (R26.89);Muscle weakness (generalized) (M62.81);Difficulty in walking, not elsewhere classified (R26.2)     Time: 7124-5809 PT Time Calculation (min) (ACUTE ONLY): 45 min  Charges:  $Therapeutic Activity: 38-52 mins                     Van Clines, PT  Acute Rehabilitation Services Office [phone]    Levi Aland 01/15/2022, 3:20 PM

## 2022-01-16 ENCOUNTER — Inpatient Hospital Stay (HOSPITAL_COMMUNITY): Payer: Medicare Other

## 2022-01-16 DIAGNOSIS — J90 Pleural effusion, not elsewhere classified: Secondary | ICD-10-CM | POA: Diagnosis not present

## 2022-01-16 LAB — CBC
HCT: 33.8 % — ABNORMAL LOW (ref 39.0–52.0)
Hemoglobin: 11.6 g/dL — ABNORMAL LOW (ref 13.0–17.0)
MCH: 31.8 pg (ref 26.0–34.0)
MCHC: 34.3 g/dL (ref 30.0–36.0)
MCV: 92.6 fL (ref 80.0–100.0)
Platelets: 201 10*3/uL (ref 150–400)
RBC: 3.65 MIL/uL — ABNORMAL LOW (ref 4.22–5.81)
RDW: 14.7 % (ref 11.5–15.5)
WBC: 8.5 10*3/uL (ref 4.0–10.5)
nRBC: 0 % (ref 0.0–0.2)

## 2022-01-16 LAB — BASIC METABOLIC PANEL
Anion gap: 9 (ref 5–15)
BUN: 20 mg/dL (ref 8–23)
CO2: 27 mmol/L (ref 22–32)
Calcium: 8.5 mg/dL — ABNORMAL LOW (ref 8.9–10.3)
Chloride: 100 mmol/L (ref 98–111)
Creatinine, Ser: 1.3 mg/dL — ABNORMAL HIGH (ref 0.61–1.24)
GFR, Estimated: 54 mL/min — ABNORMAL LOW (ref >60)
Glucose, Bld: 96 mg/dL (ref 70–99)
Potassium: 3.8 mmol/L (ref 3.5–5.1)
Sodium: 136 mmol/L (ref 135–145)

## 2022-01-16 LAB — MAGNESIUM: Magnesium: 2 mg/dL (ref 1.7–2.4)

## 2022-01-16 MED ORDER — OXYCODONE HCL 5 MG PO TABS
5.0000 mg | ORAL_TABLET | ORAL | 0 refills | Status: DC | PRN
Start: 2022-01-16 — End: 2022-01-26

## 2022-01-16 MED ORDER — OMEPRAZOLE MAGNESIUM 20 MG PO TBEC: 20.0000 mg | DELAYED_RELEASE_TABLET | Freq: Every day | ORAL | 3 refills | Status: AC

## 2022-01-16 NOTE — Progress Notes (Signed)
Occupational Therapy Treatment Patient Details Name: Eugene [name] MRN: [id number] DOB: Jul 15, 1935 Today's Date: 01/16/2022   History of present illness pt is an 86 y/o male admitted 8/17 with progressive SOB.  2 weeks ago fell on the edge of the bathtub and landed on his left ribcage.  Imaging showed Large L pleural effusion in the ED.  PMHx:  AAA, HLD, HTN, afib, stroke   OT comments  Pt seen for co-treat with OT/PT this date for pt and therapist safety. He is currently +2 Mod A for functional mobility, transfers and overall decreased endurance. He is currently Mod-Max A +2 for LB ADL's and toileting. Per discussion with pt and his son, he does not feel comfortable going home and is open to AIR to assist in maximizing independence with functional mobility and transfers prior to d/c home. Pt wants to go home but benefits from encouragement from family and therapy to participate in therapy. Per pt's son, Pt's wife has HHA that assist in caring for her "From what I saw earlier today, you can't go home yet because we don't have 2 people that can be there you at all times."    Recommendations for follow up therapy are one component of a multi-disciplinary discharge planning process, led by the attending physician.  Recommendations may be updated based on patient status, additional functional criteria and insurance authorization.    Follow Up Recommendations  Acute inpatient rehab (3hours/day)    Assistance Recommended at Discharge Frequent or constant Supervision/Assistance  Patient can return home with the following  Two people to help with walking and/or transfers;Two people to help with bathing/dressing/bathroom;Assistance with cooking/housework;Direct supervision/assist for medications management;Assist for transportation;Help with stairs or ramp for entrance   Equipment Recommendations  Other (comment) (Defer to next venue)    Recommendations for Other Services      Precautions /  Restrictions Precautions Precautions: Fall;Other (comment) Precaution Comments: PE and rib fractures Restrictions Weight Bearing Restrictions: No       Mobility Bed Mobility     General bed mobility comments: Received up in chair    Transfers Overall transfer level: Needs assistance Equipment used: Rolling walker (2 wheels) Transfers: Sit to/from Stand Sit to Stand: Mod assist, +2 physical assistance, +2 safety/equipment     General transfer comment: Stood from Chair x3 W/ +2 physical assist, cues for hand placement; Mod assist to control descent to sit back into chair. Decreased endurance overall     Balance Overall balance assessment: Needs assistance Sitting-balance support: Feet supported, No upper extremity supported Sitting balance-Leahy Scale: Good     Standing balance support: Bilateral upper extremity supported Standing balance-Leahy Scale: Poor       ADL either performed or assessed with clinical judgement   ADL Overall ADL's : Needs assistance/impaired     Grooming: Set up;Sitting;Wash/dry face;Brushing hair   Lower Body Bathing: Maximal assistance;Total assistance;+2 for physical assistance;+2 for safety/equipment;Sit to/from stand;Sitting/lateral leans   Lower Body Dressing: Maximal assistance;Total assistance;+2 for physical assistance;+2 for safety/equipment;Cueing for safety;Sit to/from stand   Toilet Transfer: +2 for safety/equipment;Moderate assistance;+2 for physical assistance;Ambulation;BSC/3in1 Toilet Transfer Details (indicate cue type and reason): Simulated trasnfer sit to stand. Toileting- Clothing Manipulation and Hygiene: Moderate assistance;+2 for physical assistance;+2 for safety/equipment;Sitting/lateral lean;Sit to/from stand     Functional mobility during ADLs: Moderate assistance;+2 for safety/equipment;+2 for physical assistance General ADL Comments: Limited by pain, weakness and endurance.    Extremity/Trunk Assessment Upper  Extremity Assessment Upper Extremity Assessment: Generalized weakness   Lower Extremity  Assessment Lower Extremity Assessment: Generalized weakness;Defer to PT evaluation   Cervical / Trunk Assessment Cervical / Trunk Assessment: Kyphotic    Vision Baseline Vision/History: 1 Wears glasses Patient Visual Report: No change from baseline Vision Assessment?: No apparent visual deficits          Cognition Arousal/Alertness: Awake/alert Behavior During Therapy: WFL for tasks assessed/performed Overall Cognitive Status: Within Functional Limits for tasks assessed                         Pertinent Vitals/ Pain       Pain Assessment Pain Assessment: Faces Faces Pain Scale: Hurts whole lot Pain Location: L chest/rib cage Pain Descriptors / Indicators: Discomfort, Grimacing, Sore Pain Intervention(s): Monitored during session, Limited activity within patient's tolerance  Home Living  Refer to initial OT eval for details     Prior Functioning/Environment   Refer to initial OT eval for details   Frequency  Min 2X/week        Progress Toward Goals  OT Goals(current goals can now be found in the care plan section)  Progress towards OT goals: Progressing toward goals  Acute Rehab OT Goals Patient Stated Goal: To get better, prior to going home OT Goal Formulation: With patient Time For Goal Achievement: 01/26/22 Potential to Achieve Goals: Good  Plan Discharge plan needs to be updated    Co-evaluation      Reason for Co-Treatment: For patient/therapist safety       AM-PAC OT "6 Clicks" Daily Activity     Outcome Measure   Help from another person eating meals?: A Little Help from another person taking care of personal grooming?: A Little Help from another person toileting, which includes using toliet, bedpan, or urinal?: A Lot Help from another person bathing (including washing, rinsing, drying)?: A Lot Help from another person to put on and taking off  regular upper body clothing?: A Little Help from another person to put on and taking off regular lower body clothing?: A Lot 6 Click Score: 15    End of Session Equipment Utilized During Treatment: Rolling walker (2 wheels)  OT Visit Diagnosis: Unsteadiness on feet (R26.81);Other abnormalities of gait and mobility (R26.89);Muscle weakness (generalized) (M62.81)   Activity Tolerance Patient tolerated treatment well   Patient Left with call bell/phone within reach;in chair;with family/visitor present   Nurse Communication Mobility status        Time: 7116-5790 OT Time Calculation (min): 45 min  Charges: OT General Charges $OT Visit: 1 Visit OT Treatments $Self Care/Home Management : 8-22 mins $Therapeutic Activity: 8-22 mins   Kasidy Gianino Beth Dixon, OTR/L 01/16/2022, 11:44 AM

## 2022-01-16 NOTE — Discharge Summary (Signed)
Physician Discharge Summary  Eugene CristalJames A [name] VHQ:[id number] DOB: 07/15/1935 DOA: 01/11/2022  PCP: Georgann HousekeeperHusain, Karrar, MD  Admit date: 01/11/2022 Discharge date: 01/16/2022  Time spent: 37 minutes  Recommendations for Outpatient Follow-up:  Limited prescription of oxycodone prescribed for chest wall related pain Will need outpatient follow-up 4 to 6 weeks as an outpatient with pulmonology and they will call him for an appointment This admission we did discontinue patient's Eliquis given trauma and 2 prior falls in the past several months at home-this will need to be discussed in the outpatient setting with both his PCP/cardiologist in terms of resumption of this if they ever resume Would recommend Chem-12 CBC in about 1 week's time from discharge  Discharge Diagnoses:  MAIN problem for hospitalization   Fall resulting in trauma to the left chest wall with hemothorax status post chest tube Atrial fibrillation Consumptive thrombocytopenia AKI which improved during hospital stay permanent A-fib now discontinued off of Eliquis   Please see below for itemized issues addressed in HOpsital- refer to other progress notes for clarity if needed  Discharge Condition: Improving  Diet recommendation: Heart healthy  Filed Weights   01/13/22 0457 01/14/22 0500 01/16/22 0500  Weight: 84 kg 86.8 kg 85.5 kg    History of present illness:  86 year old white male known history HTN HLD CVA 2007 and AAA status post stent  NSVT/A-fib CHADVASC >3+ cardiomegaly HFpEF last echo 50-55% 4/23 prior seizures on 08/28/2021 admission BPH Hemochromatosis diagnosed in 1988 now follows with Dr. Leonides Schanzorsey Prior severe hyponatremia    8/17 admit to ICU secondary to fall landing on left rib cage found to have hemothorax-, 2unit PRBC 8/18 transfused 1 more unit PRBC  Hospital Course:  Fall resulting in hemothorax Chest tube to be pulled today according to critical care by nursing staff X-ray from 8/21 = shows  left-sided tube with improved aeration left basilar airspace disease and lucency about tube?  Small loculated pneumothorax Repeat CXR 8/22 did not show any further collections and showed a tiny pneumothorax-patient is stabilized from my perspective to recover at home He voices an opinion for going home with therapy services as he has a nurse that comes out to help him and his wife We ordered bedside commode Acute blood loss into left hemothorax Transfused 3 times this admission hemoglobin stable at this time NSVT A-fib CHADVASC 2 > 3 Not really a candidate for anticoagulation outpatient decision with cardiology on follow-up Rate is controlled on metoprolol 25 twice daily HFpEF last echo 50-55% During hospitalization Lasix 40 daily was held because of AKI We did resume metoprolol as above Altace was not being taken at home Depression Resumed Seroquel 2012.5 at bedtime, Xanax 0.5 twice daily Prior seizures 08/2021 Outpatient follow-up with neurology-continue Keppra 500 twice daily   Discharge Exam: Vitals:   01/16/22 0747 01/16/22 0748  BP: 126/75 131/74  Pulse: (!) 113 (!) 108  Resp: 17 17  Temp: 99.2 F (37.3 C) 99.2 F (37.3 C)  SpO2: 95% 96%    Subj on day of d/c   He is awake coherent alert no distress he states his pain is minimal He has no chest pain  General Exam on discharge  EOMI NCAT no icterus no pallor no rales no rhonchi He has slightly decreased air entry on the left posterior lateral aspect of the lungs No lower extremity edema Abdomen is soft no rebound S1-S2 in A-fib  Discharge Instructions   Discharge Instructions     Diet - low sodium heart healthy  Complete by: As directed    Discharge instructions   Complete by: As directed    This hospitalization you were admitted because you had a fall resulting in broken ribs and bleeding into your left lung which resolved after putting a chest tube-you are better but I do not think that you should be on the  blood thinner for your A-fib at this time At least not until you rehabilitate yourself and are more stable on your feet and this can be discussed with your cardiologist in the outpatient setting  For pain from the wound to your ribs I would recommend use Tylenol around-the-clock for the first several days and if your pain is above 7/10 I have written a prescription for opiates which will help control your pain a little bit more-do not operate heavy machinery or drive if you are taking this   I would resume the majority of your other meds except maybe for your potassium, and you will need lab work in about a week or so to ensure that your hemoglobin is stable as well as your electrolytes at primary care physician office-please follow-up with Dr. Eula Listen subsequently   Increase activity slowly   Complete by: As directed    No wound care   Complete by: As directed       Allergies as of 01/16/2022       Reactions   Chlorhexidine Gluconate Itching   Don't use Chloraprep inside port a cath kits.   Macrolides And Ketolides Nausea And Vomiting   Tape Other (See Comments)   Adhesive tape,   Only use paper tape        Medication List     STOP taking these medications    apixaban 5 MG Tabs tablet Commonly known as: Eliquis   potassium chloride 10 MEQ tablet Commonly known as: KLOR-CON   ramipril 5 MG capsule Commonly known as: ALTACE       TAKE these medications    acetaminophen 325 MG tablet Commonly known as: TYLENOL Take 2 tablets (650 mg total) by mouth every 6 (six) hours as needed for mild pain (or Fever >/= 101).   ALPRAZolam 1 MG tablet Commonly known as: XANAX Take 0.5 tablets (0.5 mg total) by mouth 2 (two) times daily. For anxiety   atorvastatin 20 MG tablet Commonly known as: LIPITOR Take 1 tablet (20 mg total) by mouth daily.   fluticasone 50 MCG/ACT nasal spray Commonly known as: FLONASE Place 2 sprays into both nostrils daily as needed for allergies or  rhinitis.   furosemide 40 MG tablet Commonly known as: LASIX Take 1 tablet (40 mg total) by mouth daily.   levETIRAcetam 500 MG tablet Commonly known as: KEPPRA Take 1 tablet (500 mg total) by mouth 2 (two) times daily.   loperamide 2 MG tablet Commonly known as: IMODIUM A-D Take 2-4 mg by mouth 4 (four) times daily as needed for diarrhea or loose stools.   metoprolol tartrate 25 MG tablet Commonly known as: LOPRESSOR Take 1 tablet (25 mg total) by mouth 2 (two) times daily. What changed: how much to take   omeprazole 20 MG capsule Commonly known as: PRILOSEC Take 20 mg by mouth daily.   omeprazole 20 MG tablet Commonly known as: PRILOSEC OTC Take 1 tablet (20 mg total) by mouth daily.   oxyCODONE 5 MG immediate release tablet Commonly known as: Oxy IR/ROXICODONE Take 1 tablet (5 mg total) by mouth every 4 (four) hours as needed for moderate pain.  oxymetazoline 0.05 % nasal spray Commonly known as: AFRIN Place 1 spray into both nostrils 2 (two) times daily as needed for congestion.   QUEtiapine 25 MG tablet Commonly known as: SEROQUEL Take 12.5 mg by mouth at bedtime.   tamsulosin 0.4 MG Caps capsule Commonly known as: FLOMAX Take 0.4 mg by mouth at bedtime.   VITAMIN B-1 PO Take 1 tablet by mouth in the morning.   VITAMIN C PO Take 1 tablet by mouth in the morning.   VITAMIN D-3 PO Take 1 capsule by mouth in the morning.   VITAMIN E PO Take 1 capsule by mouth in the morning.   ZINC OXIDE EX Apply 1 application  topically See admin instructions. Apply topically diaper area every time pt uses bathroom as a skin barrier               Durable Medical Equipment  (From admission, onward)           Start     Ordered   01/16/22 1004  DME 3-in-1  Once        01/16/22 1006           Allergies  Allergen Reactions   Chlorhexidine Gluconate Itching    Don't use Chloraprep inside port a cath kits.   Macrolides And Ketolides Nausea And  Vomiting   Tape Other (See Comments)    Adhesive tape,   Only use paper tape      The results of significant diagnostics from this hospitalization (including imaging, microbiology, ancillary and laboratory) are listed below for reference.    Significant Diagnostic Studies: DG CHEST PORT 1 VIEW  Result Date: 01/16/2022 CLINICAL DATA:  Pneumothorax. EXAM: PORTABLE CHEST 1 VIEW COMPARISON:  Chest 01/15/2022 FINDINGS: Left chest tube removed. Tiny left apical pneumothorax. Left lower lobe airspace disease and small left effusion unchanged. Mild right lower lobe atelectasis unchanged. Negative for heart failure. Extensive atherosclerotic calcification IMPRESSION: Tiny left apical pneumothorax following left chest tube removal. Left lower lobe airspace disease and left effusion unchanged Electronically Signed   By: Marlan Palau M.D.   On: 01/16/2022 09:01   DG CHEST PORT 1 VIEW  Result Date: 01/15/2022 CLINICAL DATA:  An 86 year old male present dense for evaluation of LEFT-sided chest tube. EXAM: PORTABLE CHEST 1 VIEW COMPARISON:  January 13, 2022 FINDINGS: EKG leads project over the chest. Trachea it is midline accounting for slight rotation to the LEFT. Cardiomediastinal contours and hilar structures are stable. LEFT-sided chest tube remains in place. Improved aeration at the LEFT lung base. Improved interstitial and airspace opacities about the LEFT lung base since previous imaging. Persistent lucency about the LEFT-sided chest tube may reflect tiny basilar loculated pneumothorax about the chest tube. No apical or lateral component. RIGHT chest is clear. On limited assessment no acute skeletal findings. IMPRESSION: 1. LEFT-sided chest tube with improved aeration at the LEFT lung base. Still with some LEFT basilar airspace disease. 2. Persistent lucency about the LEFT-sided chest tube may reflect tiny basilar loculated pneumothorax about the chest tube. Attention on follow-up. Electronically Signed    By: Donzetta Kohut M.D.   On: 01/15/2022 08:52   DG CHEST PORT 1 VIEW  Result Date: 01/13/2022 CLINICAL DATA:  Shortness of breath and chest pain. EXAM: PORTABLE CHEST 1 VIEW COMPARISON:  01/11/2022 FINDINGS: The left chest tube is again noted overlying the left lower lung. Small residual left pleural effusion is unchanged. No pneumothorax. Stable cardiomediastinal contours. There are asymmetric opacities in the left midlung  and left base which appears similar to previous exam. Right lung is clear. Left rib fractures are again noted. IMPRESSION: 1. No change in aeration a left midlung and left base. 2. Stable left chest tube. No pneumothorax. Small residual left pleural effusion is unchanged. Electronically Signed   By: Signa Kell M.D.   On: 01/13/2022 08:20   ECHOCARDIOGRAM LIMITED  Result Date: 01/12/2022    ECHOCARDIOGRAM LIMITED REPORT   Patient Name:   Eugene [name] Date of Exam: 01/12/2022 Medical Rec #:  [id number]          Height:       72.0 in Accession #:    [id number]         Weight:       184.3 lb Date of Birth:  1936-02-25          BSA:          2.058 m Patient Age:    86 years           BP:           112/62 mmHg Patient Gender: M                  HR:           112 bpm. Exam Location:  Inpatient Procedure: Limited Echo, Color Doppler and Cardiac Doppler Indications:    Elevated Troponins  History:        Patient has prior history of Echocardiogram examinations, most                 recent 08/25/2021. Arrythmias:Atrial Fibrillation; Risk                 Factors:Hypertension and Dyslipidemia.  Sonographer:    Irving Burton Senior RDCS Referring Phys: [id number] JENNIFER YATES  Sonographer Comments: Scanned upright with left sided chest tube in place. IMPRESSIONS  1. Left ventricular ejection fraction, by estimation, is 60 to 65%. The left ventricle has normal function.  2. Right ventricular systolic function is normal. The right ventricular size is normal.  3. No evidence of mitral valve regurgitation.   4. The aortic valve is calcified. Conclusion(s)/Recommendation(s): LV function is stable. Cannot fully assess PASP. FINDINGS  Left Ventricle: Left ventricular ejection fraction, by estimation, is 60 to 65%. The left ventricle has normal function. Right Ventricle: The right ventricular size is normal. Right ventricular systolic function is normal. Pericardium: Trivial pericardial effusion is present. Tricuspid Valve: Tricuspid valve regurgitation is mild. Aortic Valve: The aortic valve is calcified. TRICUSPID VALVE TR Peak grad:   40.7 mmHg TR Vmax:        319.00 cm/s Carolan Clines Electronically signed by Carolan Clines Signature Date/Time: 01/12/2022/9:51:25 AM    Final    US RENAL  Result Date: 01/11/2022 CLINICAL DATA:  Acute kidney injury EXAM: RENAL / URINARY TRACT ULTRASOUND COMPLETE COMPARISON:  None Available. FINDINGS: Limited exam due to patient condition. Right Kidney: Renal measurements: 11.0 x 6.7 x 4.9 cm = volume: 187 mL. Echogenicity within normal limits. No mass or hydronephrosis visualized. Simple appearing cyst of the lower pole of the right kidney measuring up to 2.8 cm. Left Kidney: Renal measurements: 10.2 x 4.3 x 4.7 cm = volume: 108 mL. Echogenicity within normal limits. No mass or hydronephrosis visualized. Simple appearing cyst of the lower pole of the left kidney measuring up to 2.8 cm. Bladder: Appears normal for degree of bladder distention. Other: Prostatomegaly, measuring up to 0.9 cm. IMPRESSION: 1. No evidence of hydronephrosis.  2. Bilateral simple renal cysts. Electronically Signed   By: Allegra Lai M.D.   On: 01/11/2022 20:33   DG Chest Port 1 View  Result Date: 01/11/2022 CLINICAL DATA:  Left hemothorax EXAM: PORTABLE CHEST 1 VIEW COMPARISON:  Previous studies including the examination done earlier today FINDINGS: There is interval placement of left chest tube with marked decrease in left pleural effusion. The small residual left pleural effusion with blunting of left  lateral CP angle. There is no pneumothorax. Cardiac size is in the upper limits of normal. There are no signs of alveolar pulmonary edema. There is slight prominence of interstitial markings in left lung which may be due to resolving atelectasis. Fractures are noted in the posterolateral aspects of left seventh and eighth ribs. There may be other adjacent fractures which are not distinctly visualized. IMPRESSION: There is marked interval decrease in left pleural effusion after placement of left chest tube. There is small residual left pleural effusion. Increased interstitial markings in the left parahilar region and left lower lung field may suggest underlying interstitial pneumonia or resolving atelectasis. There is no pneumothorax. Electronically Signed   By: [name] M.D.   On: 01/11/2022 17:20   DG Chest Portable 1 View  Result Date: 01/11/2022 CLINICAL DATA:  Shortness of breath EXAM: PORTABLE CHEST 1 VIEW COMPARISON:  08/28/2021 FINDINGS: Cardiomegaly with rightward shift of the mediastinal contents. Large left pleural effusion with near complete atelectasis or consolidation of the left lung. The right lung is normally aerated. IMPRESSION: 1. Large left pleural effusion with near complete atelectasis or consolidation of the left lung. 2.  Cardiomegaly. Electronically Signed   By: Jearld Lesch M.D.   On: 01/11/2022 12:04    Microbiology: Recent Results (from the past 240 hour(s))  Resp Panel by RT-PCR (Flu A&B, Covid) Anterior Nasal Swab     Status: None   Collection Time: 01/11/22 11:30 AM   Specimen: Anterior Nasal Swab  Result Value Ref Range Status   SARS Coronavirus 2 by RT PCR NEGATIVE NEGATIVE Final    Comment: (NOTE) SARS-CoV-2 target nucleic acids are NOT DETECTED.  The SARS-CoV-2 RNA is generally detectable in upper respiratory specimens during the acute phase of infection. The lowest concentration of SARS-CoV-2 viral copies this assay can detect is 138 copies/mL. A  negative result does not preclude SARS-Cov-2 infection and should not be used as the sole basis for treatment or other patient management decisions. A negative result may occur with  improper specimen collection/handling, submission of specimen other than nasopharyngeal swab, presence of viral mutation(s) within the areas targeted by this assay, and inadequate number of viral copies(<138 copies/mL). A negative result must be combined with clinical observations, patient history, and epidemiological information. The expected result is Negative.  Fact Sheet for Patients:  BloggerCourse.com  Fact Sheet for Healthcare Providers:  SeriousBroker.it  This test is no t yet approved or cleared by the [place] FDA and  has been authorized for detection and/or diagnosis of SARS-CoV-2 by FDA under an Emergency Use Authorization (EUA). This EUA will remain  in effect (meaning this test can be used) for the duration of the COVID-19 declaration under Section 564(b)(1) of the Act, 21 U.S.C.section 360bbb-3(b)(1), unless the authorization is terminated  or revoked sooner.       Influenza A by PCR NEGATIVE NEGATIVE Final   Influenza B by PCR NEGATIVE NEGATIVE Final    Comment: (NOTE) The Xpert Xpress SARS-CoV-2/FLU/RSV plus assay is intended as an aid in the diagnosis of  influenza from Nasopharyngeal swab specimens and should not be used as a sole basis for treatment. Nasal washings and aspirates are unacceptable for Xpert Xpress SARS-CoV-2/FLU/RSV testing.  Fact Sheet for Patients: BloggerCourse.com  Fact Sheet for Healthcare Providers: SeriousBroker.it  This test is not yet approved or cleared by the [place] FDA and has been authorized for detection and/or diagnosis of SARS-CoV-2 by FDA under an Emergency Use Authorization (EUA). This EUA will remain in effect (meaning this test can  be used) for the duration of the COVID-19 declaration under Section 564(b)(1) of the Act, 21 U.S.C. section 360bbb-3(b)(1), unless the authorization is terminated or revoked.  Performed at [facility], [address], [place], [place] [address]   Body fluid culture w Gram Stain     Status: None   Collection Time: 01/11/22  4:20 PM   Specimen: Pleural Fluid  Result Value Ref Range Status   Specimen Description PLEURAL  Final   Special Requests NONE  Final   Gram Stain   Final    WBC PRESENT,BOTH PMN AND MONONUCLEAR NO ORGANISMS SEEN    Culture   Final    NO GROWTH 3 DAYS Performed at [facility], [address], [place], [place] [address]    Report Status 01/15/2022 FINAL  Final  MRSA Next Gen by PCR, Nasal     Status: None   Collection Time: 01/11/22 10:31 PM   Specimen: Nasal Mucosa; Nasal Swab  Result Value Ref Range Status   MRSA by PCR Next Gen NOT DETECTED NOT DETECTED Final    Comment: (NOTE) The GeneXpert MRSA Assay (FDA approved for NASAL specimens only), is one component of a comprehensive MRSA colonization surveillance program. It is not intended to diagnose MRSA infection nor to guide or monitor treatment for MRSA infections. Test performance is not FDA approved in patients less than 78 years old. Performed at [facility], [address], [place], [place] [address]      Labs: Basic Metabolic Panel: Recent Labs  Lab 01/12/22 0731 01/13/22 0014 01/14/22 0155 01/15/22 0150 01/16/22 0146  NA 136 135 136 135 136  K 4.0 3.4* 3.7 4.0 3.8  CL 99 100 100 101 100  CO2 27 27 28 25 27   GLUCOSE 90 105* 107* 111* 96  BUN 47* 45* 29* 23 20  CREATININE 2.53* 1.88* 1.46* 1.54* 1.30*  CALCIUM 8.2* 8.1* 8.6* 8.4* 8.5*  MG 2.1 2.0 2.1 2.0 2.0  PHOS 4.8*  --   --   --   --    Liver Function Tests: Recent Labs  Lab 01/11/22 1130 01/11/22 1832  AST 26  --   ALT 11  --   ALKPHOS 62  --   BILITOT 1.2  --   PROT 5.6* 6.2*  ALBUMIN 3.2*  --     No results for input(s): "LIPASE", "AMYLASE" in the last 168 hours. No results for input(s): "AMMONIA" in the last 168 hours. CBC: Recent Labs  Lab 01/11/22 1130 01/11/22 1832 01/12/22 1825 01/13/22 0014 01/14/22 0155 01/15/22 0150 01/16/22 0146  WBC 11.1*   < > 10.1 8.9 7.6 8.3 8.5  NEUTROABS 9.8*  --   --   --   --   --   --   HGB 9.7*   < > 11.9* 10.7* 11.0* 11.3* 11.6*  HCT 29.5*   < > 34.4* 31.5* 32.5* 32.7* 33.8*  MCV 104.2*   < > 92.0 92.9 93.7 93.7 92.6  PLT 164   < >  155 149* 168 185 201   < > = values in this interval not displayed.   Cardiac Enzymes: No results for input(s): "CKTOTAL", "CKMB", "CKMBINDEX", "TROPONINI" in the last 168 hours. BNP: BNP (last 3 results) Recent Labs    03/31/21 0406 04/01/21 0522 01/11/22 1130  BNP 240.7* 334.2* 226.3*    ProBNP (last 3 results) No results for input(s): "PROBNP" in the last 8760 hours.  CBG: Recent Labs  Lab 01/11/22 2223 01/11/22 2317  GLUCAP 105* 98       Signed:  Rhetta Mura MD   [facility] 01/16/2022, 10:06 AM

## 2022-01-16 NOTE — Progress Notes (Signed)
Medically patient is stable for discharge however some inconsistencies noted on exam and patient was not able to mobilize as well as he had with therapy than he did with the nursing staff this morning I feel it is reasonable to trial admission to inpatient rehab however I do believe that if a bed is not imminently available in 48 hours he could improve slowly to eventual baseline at home  We have held discharge for today and will attempt to see if he can get to rehab If no bed is available by 8/23 I think he can probably go home with home health

## 2022-01-16 NOTE — Progress Notes (Signed)
Physical Therapy Treatment Patient Details Name: Eugene [name] MRN: [id number] DOB: Jun 05, 1935 Today's Date: 01/16/2022   History of Present Illness pt is an 86 y/o male admitted 8/17 with progressive SOB.  2 weeks ago fell on the edge of the bathtub and landed on his left ribcage.  Imaging showed Large L pleural effusion in the ED.  PMHx:  AAA, HLD, HTN, afib, stroke    PT Comments    Continuing work on functional mobility and activity tolerance;  Session focused on functional transfers and more gait distance; Recieved pt in chair, and his son Eugene [name] was present and helpful; Eugene [name] needed 2 person mod assist for rise and stabilizing while moving hands from armrests to RW; stood 3x from recliner to RW; walked in room approx 10 feet with RW as well; Had walked with RN before session, and had gotten onto and off of the [facility] with NT just prior to beginning of therapy session;   Eugene [name] is making solid progress compared to two previous PT sessions; He very much wants to get home, and if offered: "would you like to get home?", he says "yes"; Still, he needs a heavy amount of assist, and PT is recommending an Acute Inpt Rehab stay to maximize independence and safety with mobility and ADLs in prep for getting home; Lengthy discussion with pt and son re: considerations for AIR prior to dc home, and pt and son voiced concerns about getting home today, and agree with pursuing AIR for post-acute rehab;   Discussed recs with TOC, RN, MD, OT, and AIR Adm Coords, in person and via secure chat.   Recommendations for follow up therapy are one component of a multi-disciplinary discharge planning process, led by the attending physician.  Recommendations may be updated based on patient status, additional functional criteria and insurance authorization.  Follow Up Recommendations  Acute inpatient rehab (3hours/day)     Assistance Recommended at Discharge Intermittent Supervision/Assistance  Patient can return home  with the following A lot of help with walking and/or transfers;Two people to help with bathing/dressing/bathroom;Assistance with cooking/housework;Assist for transportation;Help with stairs or ramp for entrance   Equipment Recommendations  BSC/3in1 (also tbd at next venue)    Recommendations for Other Services Rehab consult     Precautions / Restrictions Precautions Precautions: Fall;Other (comment) Precaution Comments: PE and rib fractures Restrictions Weight Bearing Restrictions: No     Mobility  Bed Mobility               General bed mobility comments: Received up in chair    Transfers Overall transfer level: Needs assistance Equipment used: Rolling walker (2 wheels) Transfers: Sit to/from Stand Sit to Stand: Mod assist, +2 physical assistance, +2 safety/equipment           General transfer comment: Stood from Chair x3 W/ +2 physical assist, cues for hand placement; Mod assist to control descent to sit back into chair. Decreased endurance overall    Ambulation/Gait Ambulation/Gait assistance: +2 safety/equipment, Min assist Gait Distance (Feet): 10 Feet Assistive device: Rolling walker (2 wheels) Gait Pattern/deviations: Step-through pattern, Decreased step length - right, Decreased step length - left, Trunk flexed       General Gait Details: Cues for upright posture, and to self-monitor for activity tolerance; walked with RW to cabinet, and once there, able to let go of RW briefly with L UE to open cabinet   Stairs             Wheelchair Mobility  Modified Rankin (Stroke Patients Only)       Balance     Sitting balance-Leahy Scale: Good       Standing balance-Leahy Scale: Poor                              Cognition Arousal/Alertness: Awake/alert Behavior During Therapy: WFL for tasks assessed/performed Overall Cognitive Status: Within Functional Limits for tasks assessed                                           Exercises Other Exercises Other Exercises: Incentive spiromentry x 10 inhalations; pulled to 750cc    General Comments General comments (skin integrity, edema, etc.): Pt's son, Eugene [name], present for session and helpful; NAD on Room Air; placed foam dressings on sacrum and bil buttocks      Pertinent Vitals/Pain Pain Assessment Pain Assessment: Faces Faces Pain Scale: Hurts whole lot Pain Location: L chest/rib cage Pain Descriptors / Indicators: Discomfort, Grimacing, Sore Pain Intervention(s): Monitored during session    Home Living                          Prior Function            PT Goals (current goals can now be found in the care plan section) Acute Rehab PT Goals Patient Stated Goal: home when appropriate PT Goal Formulation: With patient Time For Goal Achievement: 01/26/22 Potential to Achieve Goals: Good Progress towards PT goals: Progressing toward goals    Frequency    Min 3X/week      PT Plan Current plan remains appropriate    Co-evaluation PT/OT/SLP Co-Evaluation/Treatment: Yes Reason for Co-Treatment: For patient/therapist safety;To address functional/ADL transfers PT goals addressed during session: Mobility/safety with mobility        AM-PAC PT "6 Clicks" Mobility   Outcome Measure  Help needed turning from your back to your side while in a flat bed without using bedrails?: A Lot Help needed moving from lying on your back to sitting on the side of a flat bed without using bedrails?: A Lot Help needed moving to and from a bed to a chair (including a wheelchair)?: A Lot Help needed standing up from a chair using your arms (e.g., wheelchair or bedside chair)?: A Lot Help needed to walk in hospital room?: A Little Help needed climbing 3-5 steps with a railing? : Total 6 Click Score: 12    End of Session   Activity Tolerance: Patient tolerated treatment well Patient left: in chair;with call bell/phone within reach;with  family/visitor present Nurse Communication: Mobility status;Other (comment) (and considerations for change in DC plan) PT Visit Diagnosis: Other abnormalities of gait and mobility (R26.89);Muscle weakness (generalized) (M62.81);Difficulty in walking, not elsewhere classified (R26.2)     Time: 5916-3846 PT Time Calculation (min) (ACUTE ONLY): 46 min  Charges:  $Gait Training: 8-22 mins                     Van Clines, PT  Acute Rehabilitation Services Office [phone]    Levi Aland 01/16/2022, 1:12 PM

## 2022-01-16 NOTE — Progress Notes (Signed)
Discharge instructions given to patient and patients son. Both verbalized understanding of teaching and had no further questions. Patients son was in room and observed patients ambulation with walk (38ft) and stated that he would be able to assist him at home. Patient currently in room waiting for son to return to pick him up. Son had to go switch cars.

## 2022-01-16 NOTE — Progress Notes (Signed)
Pt ambulated 71ft with walker in room.

## 2022-01-16 NOTE — TOC CM/SW Note (Signed)
Patient from home, active with [facility] for HHPT, and HHRN . Eugene [name] with [facility] can accept back. However, PT/OT worked with patient with son present . Recommendation for rehab. Discussed in secure chat with PT/OT, MD and nurse. CIR consult placed.

## 2022-01-17 ENCOUNTER — Inpatient Hospital Stay (HOSPITAL_COMMUNITY)
Admission: RE | Admit: 2022-01-17 | Discharge: 2022-01-26 | DRG: 945 | Disposition: A | Discharge status: Home Health | Payer: Medicare Other | Source: Intra-hospital | Attending: Physical Medicine & Rehabilitation | Admitting: Physical Medicine & Rehabilitation

## 2022-01-17 ENCOUNTER — Inpatient Hospital Stay (HOSPITAL_COMMUNITY): Payer: Medicare Other

## 2022-01-17 ENCOUNTER — Other Ambulatory Visit: Payer: Self-pay

## 2022-01-17 ENCOUNTER — Encounter (HOSPITAL_COMMUNITY): Payer: Self-pay | Admitting: Physical Medicine & Rehabilitation

## 2022-01-17 DIAGNOSIS — N170 Acute kidney failure with tubular necrosis: Secondary | ICD-10-CM | POA: Diagnosis present

## 2022-01-17 DIAGNOSIS — Z888 Allergy status to other drugs, medicaments and biological substances status: Secondary | ICD-10-CM | POA: Diagnosis not present

## 2022-01-17 DIAGNOSIS — I129 Hypertensive chronic kidney disease with stage 1 through stage 4 chronic kidney disease, or unspecified chronic kidney disease: Secondary | ICD-10-CM | POA: Diagnosis present

## 2022-01-17 DIAGNOSIS — Z87891 Personal history of nicotine dependence: Secondary | ICD-10-CM

## 2022-01-17 DIAGNOSIS — Z87898 Personal history of other specified conditions: Secondary | ICD-10-CM

## 2022-01-17 DIAGNOSIS — L89156 Pressure-induced deep tissue damage of sacral region: Secondary | ICD-10-CM | POA: Diagnosis present

## 2022-01-17 DIAGNOSIS — Z7901 Long term (current) use of anticoagulants: Secondary | ICD-10-CM

## 2022-01-17 DIAGNOSIS — W19XXXD Unspecified fall, subsequent encounter: Secondary | ICD-10-CM | POA: Diagnosis present

## 2022-01-17 DIAGNOSIS — Z8249 Family history of ischemic heart disease and other diseases of the circulatory system: Secondary | ICD-10-CM | POA: Diagnosis not present

## 2022-01-17 DIAGNOSIS — N189 Chronic kidney disease, unspecified: Secondary | ICD-10-CM | POA: Diagnosis present

## 2022-01-17 DIAGNOSIS — F40240 Claustrophobia: Secondary | ICD-10-CM | POA: Diagnosis present

## 2022-01-17 DIAGNOSIS — Z91048 Other nonmedicinal substance allergy status: Secondary | ICD-10-CM | POA: Diagnosis not present

## 2022-01-17 DIAGNOSIS — Z79899 Other long term (current) drug therapy: Secondary | ICD-10-CM

## 2022-01-17 DIAGNOSIS — F329 Major depressive disorder, single episode, unspecified: Secondary | ICD-10-CM | POA: Diagnosis present

## 2022-01-17 DIAGNOSIS — I4811 Longstanding persistent atrial fibrillation: Secondary | ICD-10-CM | POA: Diagnosis not present

## 2022-01-17 DIAGNOSIS — R400 Somnolence: Secondary | ICD-10-CM | POA: Diagnosis present

## 2022-01-17 DIAGNOSIS — I4891 Unspecified atrial fibrillation: Secondary | ICD-10-CM

## 2022-01-17 DIAGNOSIS — K219 Gastro-esophageal reflux disease without esophagitis: Secondary | ICD-10-CM | POA: Diagnosis present

## 2022-01-17 DIAGNOSIS — M542 Cervicalgia: Secondary | ICD-10-CM | POA: Diagnosis present

## 2022-01-17 DIAGNOSIS — R5381 Other malaise: Principal | ICD-10-CM | POA: Diagnosis present

## 2022-01-17 DIAGNOSIS — R1312 Dysphagia, oropharyngeal phase: Secondary | ICD-10-CM | POA: Diagnosis not present

## 2022-01-17 DIAGNOSIS — Z8673 Personal history of transient ischemic attack (TIA), and cerebral infarction without residual deficits: Secondary | ICD-10-CM | POA: Diagnosis not present

## 2022-01-17 DIAGNOSIS — Z66 Do not resuscitate: Secondary | ICD-10-CM | POA: Diagnosis present

## 2022-01-17 DIAGNOSIS — K589 Irritable bowel syndrome without diarrhea: Secondary | ICD-10-CM | POA: Diagnosis present

## 2022-01-17 DIAGNOSIS — Z95828 Presence of other vascular implants and grafts: Secondary | ICD-10-CM | POA: Diagnosis not present

## 2022-01-17 DIAGNOSIS — I4821 Permanent atrial fibrillation: Secondary | ICD-10-CM | POA: Diagnosis present

## 2022-01-17 DIAGNOSIS — N1831 Chronic kidney disease, stage 3a: Secondary | ICD-10-CM | POA: Diagnosis not present

## 2022-01-17 DIAGNOSIS — N400 Enlarged prostate without lower urinary tract symptoms: Secondary | ICD-10-CM | POA: Diagnosis present

## 2022-01-17 DIAGNOSIS — E876 Hypokalemia: Secondary | ICD-10-CM | POA: Diagnosis not present

## 2022-01-17 DIAGNOSIS — S2242XD Multiple fractures of ribs, left side, subsequent encounter for fracture with routine healing: Secondary | ICD-10-CM

## 2022-01-17 DIAGNOSIS — R509 Fever, unspecified: Secondary | ICD-10-CM | POA: Diagnosis not present

## 2022-01-17 DIAGNOSIS — I350 Nonrheumatic aortic (valve) stenosis: Secondary | ICD-10-CM | POA: Diagnosis present

## 2022-01-17 DIAGNOSIS — E785 Hyperlipidemia, unspecified: Secondary | ICD-10-CM | POA: Diagnosis present

## 2022-01-17 DIAGNOSIS — M25512 Pain in left shoulder: Secondary | ICD-10-CM | POA: Diagnosis present

## 2022-01-17 DIAGNOSIS — R0902 Hypoxemia: Secondary | ICD-10-CM | POA: Diagnosis present

## 2022-01-17 DIAGNOSIS — F419 Anxiety disorder, unspecified: Secondary | ICD-10-CM | POA: Diagnosis present

## 2022-01-17 DIAGNOSIS — G40909 Epilepsy, unspecified, not intractable, without status epilepticus: Secondary | ICD-10-CM | POA: Diagnosis present

## 2022-01-17 DIAGNOSIS — R059 Cough, unspecified: Secondary | ICD-10-CM | POA: Diagnosis present

## 2022-01-17 DIAGNOSIS — D62 Acute posthemorrhagic anemia: Secondary | ICD-10-CM | POA: Diagnosis present

## 2022-01-17 DIAGNOSIS — J90 Pleural effusion, not elsewhere classified: Secondary | ICD-10-CM | POA: Diagnosis not present

## 2022-01-17 DIAGNOSIS — Z8679 Personal history of other diseases of the circulatory system: Secondary | ICD-10-CM | POA: Diagnosis not present

## 2022-01-17 DIAGNOSIS — I4819 Other persistent atrial fibrillation: Secondary | ICD-10-CM | POA: Diagnosis not present

## 2022-01-17 LAB — CBC
HCT: 32 % — ABNORMAL LOW (ref 39.0–52.0)
Hemoglobin: 10.9 g/dL — ABNORMAL LOW (ref 13.0–17.0)
MCH: 31.9 pg (ref 26.0–34.0)
MCHC: 34.1 g/dL (ref 30.0–36.0)
MCV: 93.6 fL (ref 80.0–100.0)
Platelets: 219 10*3/uL (ref 150–400)
RBC: 3.42 MIL/uL — ABNORMAL LOW (ref 4.22–5.81)
RDW: 14.7 % (ref 11.5–15.5)
WBC: 8.5 10*3/uL (ref 4.0–10.5)
nRBC: 0 % (ref 0.0–0.2)

## 2022-01-17 LAB — PROCALCITONIN: Procalcitonin: 0.14 ng/mL

## 2022-01-17 LAB — CBC WITH DIFFERENTIAL/PLATELET
Abs Immature Granulocytes: 0.06 10*3/uL (ref 0.00–0.07)
Basophils Absolute: 0 10*3/uL (ref 0.0–0.1)
Basophils Relative: 0 %
Eosinophils Absolute: 0.1 10*3/uL (ref 0.0–0.5)
Eosinophils Relative: 1 %
HCT: 32.9 % — ABNORMAL LOW (ref 39.0–52.0)
Hemoglobin: 11.3 g/dL — ABNORMAL LOW (ref 13.0–17.0)
Immature Granulocytes: 1 %
Lymphocytes Relative: 9 %
Lymphs Abs: 1 10*3/uL (ref 0.7–4.0)
MCH: 32.1 pg (ref 26.0–34.0)
MCHC: 34.3 g/dL (ref 30.0–36.0)
MCV: 93.5 fL (ref 80.0–100.0)
Monocytes Absolute: 1 10*3/uL (ref 0.1–1.0)
Monocytes Relative: 10 %
Neutro Abs: 8.1 10*3/uL — ABNORMAL HIGH (ref 1.7–7.7)
Neutrophils Relative %: 79 %
Platelets: 249 10*3/uL (ref 150–400)
RBC: 3.52 MIL/uL — ABNORMAL LOW (ref 4.22–5.81)
RDW: 14.7 % (ref 11.5–15.5)
WBC: 10.3 10*3/uL (ref 4.0–10.5)
nRBC: 0 % (ref 0.0–0.2)

## 2022-01-17 LAB — BASIC METABOLIC PANEL
Anion gap: 8 (ref 5–15)
BUN: 25 mg/dL — ABNORMAL HIGH (ref 8–23)
CO2: 26 mmol/L (ref 22–32)
Calcium: 8.5 mg/dL — ABNORMAL LOW (ref 8.9–10.3)
Chloride: 101 mmol/L (ref 98–111)
Creatinine, Ser: 1.37 mg/dL — ABNORMAL HIGH (ref 0.61–1.24)
GFR, Estimated: 51 mL/min — ABNORMAL LOW (ref >60)
Glucose, Bld: 97 mg/dL (ref 70–99)
Potassium: 3.9 mmol/L (ref 3.5–5.1)
Sodium: 135 mmol/L (ref 135–145)

## 2022-01-17 LAB — MAGNESIUM: Magnesium: 2.1 mg/dL (ref 1.7–2.4)

## 2022-01-17 MED ORDER — POLYETHYLENE GLYCOL 3350 17 G PO PACK: 17.0000 g | PACK | Freq: Every day | ORAL | Status: DC | PRN

## 2022-01-17 MED ORDER — PROCHLORPERAZINE MALEATE 5 MG PO TABS: 5.0000 mg | ORAL_TABLET | Freq: Four times a day (QID) | ORAL | Status: DC | PRN

## 2022-01-17 MED ORDER — ADULT MULTIVITAMIN W/MINERALS CH
1.0000 | ORAL_TABLET | Freq: Every day | ORAL | Status: DC
  Administered 2022-01-18 – 2022-01-26 (×9): 1 via ORAL
  Filled 2022-01-17 (×9): qty 1

## 2022-01-17 MED ORDER — DIPHENHYDRAMINE HCL 12.5 MG/5ML PO ELIX: 12.5000 mg | ORAL_SOLUTION | Freq: Four times a day (QID) | ORAL | Status: DC | PRN

## 2022-01-17 MED ORDER — TRAZODONE HCL 50 MG PO TABS: 25.0000 mg | ORAL_TABLET | Freq: Every evening | ORAL | Status: DC | PRN

## 2022-01-17 MED ORDER — BISACODYL 10 MG RE SUPP: 10.0000 mg | Freq: Every day | RECTAL | Status: DC | PRN

## 2022-01-17 MED ORDER — ALBUTEROL SULFATE (2.5 MG/3ML) 0.083% IN NEBU: 2.5000 mg | INHALATION_SOLUTION | RESPIRATORY_TRACT | Status: DC | PRN

## 2022-01-17 MED ORDER — ATORVASTATIN CALCIUM 10 MG PO TABS
20.0000 mg | ORAL_TABLET | Freq: Every day | ORAL | Status: DC
  Administered 2022-01-18 – 2022-01-26 (×9): 20 mg via ORAL
  Filled 2022-01-17 (×9): qty 2

## 2022-01-17 MED ORDER — FLEET ENEMA 7-19 GM/118ML RE ENEM
1.0000 | ENEMA | Freq: Once | RECTAL | Status: DC | PRN
Start: 2022-01-17 — End: 2022-01-26

## 2022-01-17 MED ORDER — BOOST / RESOURCE BREEZE PO LIQD CUSTOM
1.0000 | Freq: Three times a day (TID) | ORAL | Status: DC
  Administered 2022-01-17 – 2022-01-25 (×14): 1 via ORAL

## 2022-01-17 MED ORDER — QUETIAPINE FUMARATE 25 MG PO TABS
25.0000 mg | ORAL_TABLET | Freq: Every day | ORAL | Status: DC
  Administered 2022-01-17 – 2022-01-18 (×2): 25 mg via ORAL
  Filled 2022-01-17 (×2): qty 1

## 2022-01-17 MED ORDER — TIZANIDINE HCL 4 MG PO TABS
2.0000 mg | ORAL_TABLET | Freq: Every evening | ORAL | Status: DC | PRN
  Administered 2022-01-18: 2 mg via ORAL
  Filled 2022-01-17: qty 1

## 2022-01-17 MED ORDER — OXYCODONE HCL 5 MG PO TABS
5.0000 mg | ORAL_TABLET | ORAL | Status: DC | PRN
  Administered 2022-01-18 – 2022-01-19 (×4): 5 mg via ORAL
  Filled 2022-01-17 (×4): qty 1

## 2022-01-17 MED ORDER — DICLOFENAC SODIUM 1 % EX GEL
2.0000 g | Freq: Four times a day (QID) | CUTANEOUS | Status: DC | PRN
Start: 2022-01-17 — End: 2022-01-26
  Filled 2022-01-17: qty 100

## 2022-01-17 MED ORDER — PANTOPRAZOLE SODIUM 40 MG PO TBEC
40.0000 mg | DELAYED_RELEASE_TABLET | Freq: Every day | ORAL | Status: DC
  Administered 2022-01-18 – 2022-01-24 (×7): 40 mg via ORAL
  Filled 2022-01-17 (×7): qty 1

## 2022-01-17 MED ORDER — LEVETIRACETAM 500 MG PO TABS
500.0000 mg | ORAL_TABLET | Freq: Two times a day (BID) | ORAL | Status: DC
  Administered 2022-01-17 – 2022-01-26 (×18): 500 mg via ORAL
  Filled 2022-01-17 (×18): qty 1

## 2022-01-17 MED ORDER — DOCUSATE SODIUM 100 MG PO CAPS
100.0000 mg | ORAL_CAPSULE | Freq: Two times a day (BID) | ORAL | Status: DC
  Administered 2022-01-17 – 2022-01-26 (×18): 100 mg via ORAL
  Filled 2022-01-17 (×18): qty 1

## 2022-01-17 MED ORDER — GUAIFENESIN-DM 100-10 MG/5ML PO SYRP: 5.0000 mL | ORAL_SOLUTION | Freq: Four times a day (QID) | ORAL | Status: DC | PRN

## 2022-01-17 MED ORDER — PROCHLORPERAZINE 25 MG RE SUPP: 12.5000 mg | Freq: Four times a day (QID) | RECTAL | Status: DC | PRN

## 2022-01-17 MED ORDER — TAMSULOSIN HCL 0.4 MG PO CAPS
0.4000 mg | ORAL_CAPSULE | Freq: Every day | ORAL | Status: DC
  Administered 2022-01-17 – 2022-01-25 (×9): 0.4 mg via ORAL
  Filled 2022-01-17 (×9): qty 1

## 2022-01-17 MED ORDER — ALUM & MAG HYDROXIDE-SIMETH 200-200-20 MG/5ML PO SUSP
30.0000 mL | ORAL | Status: DC | PRN
Start: 2022-01-17 — End: 2022-01-26

## 2022-01-17 MED ORDER — ACETAMINOPHEN 325 MG PO TABS: 325.0000 mg | ORAL_TABLET | ORAL | Status: DC | PRN

## 2022-01-17 MED ORDER — METOPROLOL TARTRATE 25 MG PO TABS
25.0000 mg | ORAL_TABLET | Freq: Two times a day (BID) | ORAL | Status: DC
  Administered 2022-01-17 – 2022-01-19 (×4): 25 mg via ORAL
  Filled 2022-01-17 (×4): qty 1

## 2022-01-17 MED ORDER — PROCHLORPERAZINE EDISYLATE 10 MG/2ML IJ SOLN: 5.0000 mg | Freq: Four times a day (QID) | INTRAMUSCULAR | Status: DC | PRN

## 2022-01-17 MED ORDER — ALPRAZOLAM 0.5 MG PO TABS
0.5000 mg | ORAL_TABLET | Freq: Two times a day (BID) | ORAL | Status: DC
  Administered 2022-01-17 – 2022-01-26 (×18): 0.5 mg via ORAL
  Filled 2022-01-17 (×18): qty 1

## 2022-01-17 NOTE — H&P (Addendum)
Physical Medicine and Rehabilitation Admission H&P    Chief Complaint  Patient presents with   Debility    HPI: Eugene [name] is an 86 year old male with history of A fib-on eliquis, IBS, HTN, hereditary hemochromatosis who was admitted on 01/11/22 with 1-2 weeks history of progressive SOB as well as reports of fall onto side of bathtub. He was found to have large left hemothorax, volume overload, ABLA and elevated troponin. He refused BIPAP. Pigtail catheter placed left chest with 2.5 L of bloody fluid and ABLA treated with 3 units PRBC secondary to low BP/hypoxia.   Elquis d/c and CT surgery consulted for input and felt that there was no indication for VATS. AKI due to ATN is improving and recommendations are to hold Eliquis till follo wup with PCP/cardiology on outpatient basis to determine risks v/s benefits.  Sleep study recommended due to family concerns of OSA but he has declined study due to claustrophobia. Bloody drainage has resolved and CT removed on 08/21. DTI/Chronic tissue damage from incontinence/fall treated with silicon foam, male purewick  and LALM. Feels very fatigued.    Review of Systems  Constitutional:  Negative for chills and fever.  HENT:  Positive for hearing loss. Negative for tinnitus.   Eyes:  Negative for blurred vision and double vision.  Respiratory:  Negative for cough and shortness of breath.   Cardiovascular:  Negative for chest pain and palpitations.  Gastrointestinal:  Negative for abdominal pain and heartburn.  Genitourinary:  Negative for dysuria.  Musculoskeletal:  Negative for back pain, myalgias and neck pain.  Neurological:  Positive for weakness. Negative for dizziness and headaches.  Psychiatric/Behavioral:  The patient is nervous/anxious.     Past Medical History:  Diagnosis Date   AAA (abdominal aortic aneurysm) (HCC) 06/29/2007   stent graft   Aortic stenosis 09/05/2017   Mild AS/AR by echo 01/2017.   GERD (gastroesophageal reflux  disease)    Hemochromatosis    Hyperlipidemia    Hypertension    Irritable bowel syndrome    Permanent atrial fibrillation (HCC)    Stroke (HCC)    Transient Ischemic Attack    Past Surgical History:  Procedure Laterality Date   ABDOMINAL AORTIC ANEURYSM REPAIR     ENDOVASCULAR STENT INSERTION  07/18/2007   saccular infrarenal aortic aneurysm   EYE SURGERY Bilateral    Cataract     IR FLUORO GUIDE CV LINE LEFT  12/08/2018   IR FLUORO GUIDE CV LINE RIGHT  02/13/2017   IR FLUORO GUIDE CV LINE RIGHT  02/25/2017   IR US GUIDE VASC ACCESS RIGHT  02/13/2017   PICC LINE PLACE PERIPHERAL ([facility] HX)  Oct 21, 2014    Family History  Problem Relation Age of Onset   Hypertension Mother    Heart disease Mother        AAA, he passed in his 46 s of Heart Diseasse   Heart disease Father        AAA history and he passed at age 74  of   Disease    Social History:  Married. Wife is 57 years old and has dementia/. She has full time caregiver/housekeeper.   Retired from Armed forces operational officer. He was independent with walker PTA. He reports that he quit smoking about 22 years ago. His smoking use included cigarettes. He has a 40.00 pack-year smoking history. He has never used smokeless tobacco. He reports current alcohol use of about 1.0 - 2.0 standard drink of alcohol per week.  He reports that he does not use drugs.   Allergies  Allergen Reactions   Chlorhexidine Gluconate Itching    Don't use Chloraprep inside port a cath kits.   Macrolides And Ketolides Nausea And Vomiting   Tape Other (See Comments)    Adhesive tape,   Only use paper tape    No medications prior to admission.   Home: Home Living Family/patient expects to be discharged to:: Private residence Living Arrangements: Spouse/significant other Available Help at Discharge: Family, Personal care attendant, Available 24 hours/day Type of Home: House Home Access: Level entry Home Layout: One level Bathroom Shower/Tub: Architectural technologist: Handicapped height Bathroom Accessibility: Yes Home Equipment: Agricultural consultant (2 wheels), Grab bars - tub/shower, Information systems manager - built in, Stage manager Comments: pt has 24/7 caregiver for himself and his wife that assists with all care   Functional History: Prior Function Prior Level of Function : Needs assist Mobility Comments: pt was amb with RW with supervision, pt able to take self to bathroom, no driving, no cooking   Functional Status:  Mobility: Bed Mobility Overal bed mobility: Needs Assistance Bed Mobility: Supine to Sit Supine to sit: Mod assist Sit to supine: Max assist, +2 for physical assistance, +2 for safety/equipment General bed mobility comments: Received up in chair Transfers Overall transfer level: Needs assistance Equipment used: Rolling walker (2 wheels) Transfers: Sit to/from Stand Sit to Stand: Mod assist, +2 physical assistance, +2 safety/equipment Bed to/from chair/wheelchair/BSC transfer type:: Step pivot Step pivot transfers: Min assist, +2 safety/equipment General transfer comment: Stood from Chair x3 W/ +2 physical assist, cues for hand placement; Mod assist to control descent to sit back into chair. Decreased endurance overall Ambulation/Gait Ambulation/Gait assistance: +2 safety/equipment, Min assist Gait Distance (Feet): 10 Feet Assistive device: Rolling walker (2 wheels) Gait Pattern/deviations: Step-through pattern, Decreased step length - right, Decreased step length - left, Trunk flexed General Gait Details: Cues for upright posture, and to self-monitor for activity tolerance; walked with RW to cabinet, and once there, able to let go of RW briefly with L UE to open cabinet   ADL: ADL Overall ADL's : Needs assistance/impaired Eating/Feeding: Set up, Sitting Grooming: Set up, Sitting, Wash/dry face, Brushing hair Upper Body Bathing: Minimal assistance, Sitting Lower Body Bathing: Maximal assistance, Total  assistance, +2 for physical assistance, +2 for safety/equipment, Sit to/from stand, Sitting/lateral leans Upper Body Dressing : Minimal assistance, Sitting Lower Body Dressing: Maximal assistance, Total assistance, +2 for physical assistance, +2 for safety/equipment, Cueing for safety, Sit to/from stand Toilet Transfer: +2 for safety/equipment, Moderate assistance, +2 for physical assistance, Ambulation, BSC/3in1 Toilet Transfer Details (indicate cue type and reason): Simulated trasnfer sit to stand. Toileting- Clothing Manipulation and Hygiene: Moderate assistance, +2 for physical assistance, +2 for safety/equipment, Sitting/lateral lean, Sit to/from stand Functional mobility during ADLs: Moderate assistance, +2 for safety/equipment, +2 for physical assistance General ADL Comments: Limited by pain, weakness and endurance.   Cognition: Cognition Overall Cognitive Status: Within Functional Limits for tasks assessed Orientation Level: Oriented X4 Cognition Arousal/Alertness: Awake/alert Behavior During Therapy: WFL for tasks assessed/performed Overall Cognitive Status: Within Functional Limits for tasks assessed General Comments: Follows all simple commands, oriented to self, place, time and situation.      There were no vitals taken for this visit. Physical Exam Vitals and nursing note reviewed. Fever of 100.8 after patient reached CIR floor Gen: no distress, normal appearing, fatiged HEENT: oral mucosa pink and moist, NCAT Cardio: Reg rate, impaired endurance Chest: normal effort, normal  rate of breathing Abd: soft, non-distended Ext: no edema Psych: pleasant, normal affect, decreased motivation Skin: intact Neurological:     Mental Status: He is alert and oriented to person, place, and time. Diffuse weakness, left shoulder limited by pain      Results for orders placed or performed during the hospital encounter of 01/11/22 (from the past 48 hour(s))  CBC     Status: Abnormal    Collection Time: 01/16/22  1:46 AM  Result Value Ref Range   WBC 8.5 4.0 - 10.5 K/uL   RBC 3.65 (L) 4.22 - 5.81 MIL/uL   Hemoglobin 11.6 (L) 13.0 - 17.0 g/dL   HCT 35.5 (L) 73.2 - 20.2 %   MCV 92.6 80.0 - 100.0 fL   MCH 31.8 26.0 - 34.0 pg   MCHC 34.3 30.0 - 36.0 g/dL   RDW 54.2 70.6 - 23.7 %   Platelets 201 150 - 400 K/uL   nRBC 0.0 0.0 - 0.2 %    Comment: Performed at [facility], [address] [place], [place] [address]  Basic metabolic panel     Status: Abnormal   Collection Time: 01/16/22  1:46 AM  Result Value Ref Range   Sodium 136 135 - 145 mmol/L   Potassium 3.8 3.5 - 5.1 mmol/L   Chloride 100 98 - 111 mmol/L   CO2 27 22 - 32 mmol/L   Glucose, Bld 96 70 - 99 mg/dL    Comment: Glucose reference range applies only to samples taken after fasting for at least 8 hours.   BUN 20 8 - 23 mg/dL   Creatinine, Ser 5.17 (H) 0.61 - 1.24 mg/dL   Calcium 8.5 (L) 8.9 - 10.3 mg/dL   GFR, Estimated 54 (L) >60 mL/min    Comment: (NOTE) Calculated using the CKD-EPI Creatinine Equation (2021)    Anion gap 9 5 - 15    Comment: Performed at [facility], [address] [place], [place] [address]  Magnesium     Status: None   Collection Time: 01/16/22  1:46 AM  Result Value Ref Range   Magnesium 2.0 1.7 - 2.4 mg/dL    Comment: Performed at [facility], [address] [place], [place] [address]  CBC     Status: Abnormal   Collection Time: 01/17/22  1:48 AM  Result Value Ref Range   WBC 8.5 4.0 - 10.5 K/uL   RBC 3.42 (L) 4.22 - 5.81 MIL/uL   Hemoglobin 10.9 (L) 13.0 - 17.0 g/dL   HCT 26.9 (L) 48.5 - 46.2 %   MCV 93.6 80.0 - 100.0 fL   MCH 31.9 26.0 - 34.0 pg   MCHC 34.1 30.0 - 36.0 g/dL   RDW 70.3 50.0 - 93.8 %   Platelets 219 150 - 400 K/uL   nRBC 0.0 0.0 - 0.2 %    Comment: Performed at [facility], [address], [place], [place] [address]  Basic metabolic panel     Status: Abnormal   Collection Time: 01/17/22  1:48 AM  Result Value Ref Range    Sodium 135 135 - 145 mmol/L   Potassium 3.9 3.5 - 5.1 mmol/L   Chloride 101 98 - 111 mmol/L   CO2 26 22 - 32 mmol/L   Glucose, Bld 97 70 - 99 mg/dL    Comment: Glucose reference range applies only to samples taken after fasting for at least 8 hours.   BUN 25 (H) 8 - 23  mg/dL   Creatinine, Ser 5.40 (H) 0.61 - 1.24 mg/dL   Calcium 8.5 (L) 8.9 - 10.3 mg/dL   GFR, Estimated 51 (L) >60 mL/min    Comment: (NOTE) Calculated using the CKD-EPI Creatinine Equation (2021)    Anion gap 8 5 - 15    Comment: Performed at [facility], [address], [place], [place] [address]  Magnesium     Status: None   Collection Time: 01/17/22  1:48 AM  Result Value Ref Range   Magnesium 2.1 1.7 - 2.4 mg/dL    Comment: Performed at [facility], [address] [place], [place] [address]   DG CHEST PORT 1 VIEW  Result Date: 01/16/2022 CLINICAL DATA:  Pneumothorax. EXAM: PORTABLE CHEST 1 VIEW COMPARISON:  Chest 01/15/2022 FINDINGS: Left chest tube removed. Tiny left apical pneumothorax. Left lower lobe airspace disease and small left effusion unchanged. Mild right lower lobe atelectasis unchanged. Negative for heart failure. Extensive atherosclerotic calcification IMPRESSION: Tiny left apical pneumothorax following left chest tube removal. Left lower lobe airspace disease and left effusion unchanged Electronically Signed   By: Marlan Palau M.D.   On: 01/16/2022 09:01      There were no vitals taken for this visit.  Medical Problem List and Plan: 1. Functional deficits secondary to pulmonary debility  -patient may shower  -ELOS/Goals: 10-14 days  -Admit to CIR 2.  Antithrombotics: -DVT/anticoagulation:  Mechanical: Sequential compression devices, below knee Bilateral lower extremities  -antiplatelet therapy: N/A 3. L shoulder pain: add voltaren gel prn. Oxycodone prn.  4. Mood/Behavior/Sleep: LCSW to follow for evaluation and support.   -antipsychotic agents: N/A 5. Neuropsych/cognition:  This patient is capable of making decisions on his own behalf. 6. Skin/Wound Care: Routine pressure relief measures.   --foam dressing to DTI w/chronic tissue damage.   --continue nutritional supplements.  7. Fluids/Electrolytes/Nutrition: Monitor I/O. Check CMET in am  --continue nutritional supplements. 8. Left hemothorax: has resolved. Pulmonary hygiene.  --follow up with Pulmonary in 5 weeks w/CxR. 9. A fib: Monitor HR TID--continue Metoprolol BID 10. ABLA: Stable overall. Recheck CBC in am.  --h/o hemochromatosis.  11. H/o depression w/anxiety: Continue Xanax 0.5 mg bid w/ Seroquel 25 mg/HS. 12. H/o Seizure d/o: Continue Keppra bid.  13. Acute on chronic renal failure: Baseline SCr 1.27-1.3. Lasix on hold.   --has improved form 39/3.05-->25/1.37  --encourage fluid intake. 14. Airspace disease on CXR: incentive spirometer ordered. 15. Pleural effusion: daily weights ordered to monitor for fluid overload while off of Lasix.  16. Fever of 100.8: ordered BC, UC, UA, procalcitonin, CBC, and CXR. 17. Fatigue: ordered iron, B12, and folate with morning labs   I have personally performed a face to face diagnostic evaluation, including, but not limited to relevant history and physical exam findings, of this patient and developed relevant assessment and plan.  Additionally, I have reviewed and concur with the physician assistant's documentation above.  Jacquelynn Cree, PA-C   Horton Chin, MD 01/17/2022

## 2022-01-17 NOTE — Discharge Summary (Signed)
Physician Discharge Summary  Eugene [name] PZW:[id number] DOB: 07/27/1935 DOA: 01/11/2022  PCP: [name], MD  Admit date: 01/11/2022 Discharge date: 01/17/2022  Time spent: 37 minutes  Recommendations for Outpatient Follow-up:  Limited prescription of oxycodone prescribed for chest wall related pain Will need outpatient follow-up 4 to 6 weeks as an outpatient with pulmonology and they will call him for an appointment This admission we did discontinue patient's Eliquis given trauma and 2 prior falls in the past several months at home-this will need to be discussed in the outpatient setting with both his PCP/cardiologist in terms of resumption of this if they ever resume Would recommend Chem-12 CBC in about 1 week's time from discharge  Discharge Diagnoses:  Fall resulting in trauma to the left chest wall with hemothorax status post chest tube Atrial fibrillation Consumptive thrombocytopenia AKI which improved during hospital stay permanent A-fib now discontinued off of Eliquis  Please see below for itemized issues addressed in Hospital-refer to other progress notes for clarity if needed  Patient remains medically stable for discharge - awaiting safe disposition  Discharge Condition: Improving  Diet recommendation: Heart healthy  Filed Weights   01/13/22 0457 01/14/22 0500 01/16/22 0500  Weight: 84 kg 86.8 kg 85.5 kg    History of present illness:  86 year old white male known history HTN HLD CVA 2007 and AAA status post stent  NSVT/A-fib CHADVASC >3+ cardiomegaly HFpEF last echo 50-55% 4/23 prior seizures on 08/28/2021 admission BPH Hemochromatosis diagnosed in 1988 now follows with Dr. Leonides Schanz Prior severe hyponatremia   8/17 admit to ICU secondary to fall landing on left rib cage found to have hemothorax-, 2unit PRBC 8/18 transfused 1 more unit PRBC  Hospital Course:  Fall resulting in hemothorax Chest tube to be pulled today according to critical care by nursing  staff X-ray from 8/21 = shows left-sided tube with improved aeration left basilar airspace disease and lucency about tube?  Small loculated pneumothorax Repeat CXR 8/22 did not show any further collections and showed a tiny pneumothorax-patient is stabilized from my perspective to recover at home He voices an opinion for going home with therapy services as he has a nurse that comes out to help him and his wife We ordered bedside commode Acute blood loss into left hemothorax Transfused 3 times this admission hemoglobin stable at this time NSVT A-fib CHADVASC 2 > 3 Not really a candidate for anticoagulation outpatient decision with cardiology on follow-up Rate is controlled on metoprolol 25 twice daily HFpEF last echo 50-55% During hospitalization Lasix 40 daily was held because of AKI We did resume metoprolol as above Altace was not being taken at home Depression Resumed Seroquel 2012.5 at bedtime, Xanax 0.5 twice daily Prior seizures 08/2021 Outpatient follow-up with neurology-continue Keppra 500 twice daily   Discharge Exam: Vitals:   01/16/22 2028 01/17/22 0414  BP: 131/75 122/79  Pulse: (!) 110 (!) 103  Resp: 18 18  Temp: 99.2 F (37.3 C) 99.8 F (37.7 C)  SpO2: 95% 91%    Subj on day of d/c   He is awake coherent alert no distress he states his pain is minimal He has no chest pain  General Exam on discharge  EOMI NCAT no icterus no pallor no rales no rhonchi He has slightly decreased air entry on the left posterior lateral aspect of the lungs No lower extremity edema Abdomen is soft no rebound S1-S2 in A-fib  Discharge Instructions   Discharge Instructions     Diet - low sodium heart  healthy   Complete by: As directed    Discharge instructions   Complete by: As directed    This hospitalization you were admitted because you had a fall resulting in broken ribs and bleeding into your left lung which resolved after putting a chest tube-you are better but I do not  think that you should be on the blood thinner for your A-fib at this time At least not until you rehabilitate yourself and are more stable on your feet and this can be discussed with your cardiologist in the outpatient setting  For pain from the wound to your ribs I would recommend use Tylenol around-the-clock for the first several days and if your pain is above 7/10 I have written a prescription for opiates which will help control your pain a little bit more-do not operate heavy machinery or drive if you are taking this   I would resume the majority of your other meds except maybe for your potassium, and you will need lab work in about a week or so to ensure that your hemoglobin is stable as well as your electrolytes at primary care physician office-please follow-up with Dr. Eula ListenHussain subsequently   Increase activity slowly   Complete by: As directed    No wound care   Complete by: As directed       Allergies as of 01/17/2022       Reactions   Chlorhexidine Gluconate Itching   Don't use Chloraprep inside port a cath kits.   Macrolides And Ketolides Nausea And Vomiting   Tape Other (See Comments)   Adhesive tape,   Only use paper tape        Medication List     STOP taking these medications    apixaban 5 MG Tabs tablet Commonly known as: Eliquis   potassium chloride 10 MEQ tablet Commonly known as: KLOR-CON   ramipril 5 MG capsule Commonly known as: ALTACE       TAKE these medications    acetaminophen 325 MG tablet Commonly known as: TYLENOL Take 2 tablets (650 mg total) by mouth every 6 (six) hours as needed for mild pain (or Fever >/= 101).   ALPRAZolam 1 MG tablet Commonly known as: XANAX Take 0.5 tablets (0.5 mg total) by mouth 2 (two) times daily. For anxiety   atorvastatin 20 MG tablet Commonly known as: LIPITOR Take 1 tablet (20 mg total) by mouth daily.   fluticasone 50 MCG/ACT nasal spray Commonly known as: FLONASE Place 2 sprays into both nostrils  daily as needed for allergies or rhinitis.   furosemide 40 MG tablet Commonly known as: LASIX Take 1 tablet (40 mg total) by mouth daily.   levETIRAcetam 500 MG tablet Commonly known as: KEPPRA Take 1 tablet (500 mg total) by mouth 2 (two) times daily.   loperamide 2 MG tablet Commonly known as: IMODIUM A-D Take 2-4 mg by mouth 4 (four) times daily as needed for diarrhea or loose stools.   metoprolol tartrate 25 MG tablet Commonly known as: LOPRESSOR Take 1 tablet (25 mg total) by mouth 2 (two) times daily. What changed: how much to take   omeprazole 20 MG capsule Commonly known as: PRILOSEC Take 20 mg by mouth daily.   omeprazole 20 MG tablet Commonly known as: PRILOSEC OTC Take 1 tablet (20 mg total) by mouth daily.   oxyCODONE 5 MG immediate release tablet Commonly known as: Oxy IR/ROXICODONE Take 1 tablet (5 mg total) by mouth every 4 (four) hours as needed for  moderate pain.   oxymetazoline 0.05 % nasal spray Commonly known as: AFRIN Place 1 spray into both nostrils 2 (two) times daily as needed for congestion.   QUEtiapine 25 MG tablet Commonly known as: SEROQUEL Take 12.5 mg by mouth at bedtime.   tamsulosin 0.4 MG Caps capsule Commonly known as: FLOMAX Take 0.4 mg by mouth at bedtime.   VITAMIN B-1 PO Take 1 tablet by mouth in the morning.   VITAMIN C PO Take 1 tablet by mouth in the morning.   VITAMIN D-3 PO Take 1 capsule by mouth in the morning.   VITAMIN E PO Take 1 capsule by mouth in the morning.   ZINC OXIDE EX Apply 1 application  topically See admin instructions. Apply topically diaper area every time pt uses bathroom as a skin barrier               Durable Medical Equipment  (From admission, onward)           Start     Ordered   01/16/22 1004  DME 3-in-1  Once        01/16/22 1006           Allergies  Allergen Reactions   Chlorhexidine Gluconate Itching    Don't use Chloraprep inside port a cath kits.   Macrolides  And Ketolides Nausea And Vomiting   Tape Other (See Comments)    Adhesive tape,   Only use paper tape      The results of significant diagnostics from this hospitalization (including imaging, microbiology, ancillary and laboratory) are listed below for reference.    Significant Diagnostic Studies: DG CHEST PORT 1 VIEW  Result Date: 01/16/2022 CLINICAL DATA:  Pneumothorax. EXAM: PORTABLE CHEST 1 VIEW COMPARISON:  Chest 01/15/2022 FINDINGS: Left chest tube removed. Tiny left apical pneumothorax. Left lower lobe airspace disease and small left effusion unchanged. Mild right lower lobe atelectasis unchanged. Negative for heart failure. Extensive atherosclerotic calcification IMPRESSION: Tiny left apical pneumothorax following left chest tube removal. Left lower lobe airspace disease and left effusion unchanged Electronically Signed   By: Marlan Palau M.D.   On: 01/16/2022 09:01   DG CHEST PORT 1 VIEW  Result Date: 01/15/2022 CLINICAL DATA:  An 86 year old male present dense for evaluation of LEFT-sided chest tube. EXAM: PORTABLE CHEST 1 VIEW COMPARISON:  January 13, 2022 FINDINGS: EKG leads project over the chest. Trachea it is midline accounting for slight rotation to the LEFT. Cardiomediastinal contours and hilar structures are stable. LEFT-sided chest tube remains in place. Improved aeration at the LEFT lung base. Improved interstitial and airspace opacities about the LEFT lung base since previous imaging. Persistent lucency about the LEFT-sided chest tube may reflect tiny basilar loculated pneumothorax about the chest tube. No apical or lateral component. RIGHT chest is clear. On limited assessment no acute skeletal findings. IMPRESSION: 1. LEFT-sided chest tube with improved aeration at the LEFT lung base. Still with some LEFT basilar airspace disease. 2. Persistent lucency about the LEFT-sided chest tube may reflect tiny basilar loculated pneumothorax about the chest tube. Attention on follow-up.  Electronically Signed   By: Donzetta Kohut M.D.   On: 01/15/2022 08:52   DG CHEST PORT 1 VIEW  Result Date: 01/13/2022 CLINICAL DATA:  Shortness of breath and chest pain. EXAM: PORTABLE CHEST 1 VIEW COMPARISON:  01/11/2022 FINDINGS: The left chest tube is again noted overlying the left lower lung. Small residual left pleural effusion is unchanged. No pneumothorax. Stable cardiomediastinal contours. There are asymmetric opacities  in the left midlung and left base which appears similar to previous exam. Right lung is clear. Left rib fractures are again noted. IMPRESSION: 1. No change in aeration a left midlung and left base. 2. Stable left chest tube. No pneumothorax. Small residual left pleural effusion is unchanged. Electronically Signed   By: Signa Kell M.D.   On: 01/13/2022 08:20   ECHOCARDIOGRAM LIMITED  Result Date: 01/12/2022    ECHOCARDIOGRAM LIMITED REPORT   Patient Name:   SAVAN RUTA Date of Exam: 01/12/2022 Medical Rec #:  [id number]          Height:       72.0 in Accession #:    [id number]         Weight:       184.3 lb Date of Birth:  27-Jul-1935          BSA:          2.058 m Patient Age:    86 years           BP:           112/62 mmHg Patient Gender: M                  HR:           112 bpm. Exam Location:  Inpatient Procedure: Limited Echo, Color Doppler and Cardiac Doppler Indications:    Elevated Troponins  History:        Patient has prior history of Echocardiogram examinations, most                 recent 08/25/2021. Arrythmias:Atrial Fibrillation; Risk                 Factors:Hypertension and Dyslipidemia.  Sonographer:    Irving Burton Senior RDCS Referring Phys: [id number] JENNIFER YATES  Sonographer Comments: Scanned upright with left sided chest tube in place. IMPRESSIONS  1. Left ventricular ejection fraction, by estimation, is 60 to 65%. The left ventricle has normal function.  2. Right ventricular systolic function is normal. The right ventricular size is normal.  3. No evidence of mitral  valve regurgitation.  4. The aortic valve is calcified. Conclusion(s)/Recommendation(s): LV function is stable. Cannot fully assess PASP. FINDINGS  Left Ventricle: Left ventricular ejection fraction, by estimation, is 60 to 65%. The left ventricle has normal function. Right Ventricle: The right ventricular size is normal. Right ventricular systolic function is normal. Pericardium: Trivial pericardial effusion is present. Tricuspid Valve: Tricuspid valve regurgitation is mild. Aortic Valve: The aortic valve is calcified. TRICUSPID VALVE TR Peak grad:   40.7 mmHg TR Vmax:        319.00 cm/s Carolan Clines Electronically signed by Carolan Clines Signature Date/Time: 01/12/2022/9:51:25 AM    Final    US RENAL  Result Date: 01/11/2022 CLINICAL DATA:  Acute kidney injury EXAM: RENAL / URINARY TRACT ULTRASOUND COMPLETE COMPARISON:  None Available. FINDINGS: Limited exam due to patient condition. Right Kidney: Renal measurements: 11.0 x 6.7 x 4.9 cm = volume: 187 mL. Echogenicity within normal limits. No mass or hydronephrosis visualized. Simple appearing cyst of the lower pole of the right kidney measuring up to 2.8 cm. Left Kidney: Renal measurements: 10.2 x 4.3 x 4.7 cm = volume: 108 mL. Echogenicity within normal limits. No mass or hydronephrosis visualized. Simple appearing cyst of the lower pole of the left kidney measuring up to 2.8 cm. Bladder: Appears normal for degree of bladder distention. Other: Prostatomegaly, measuring up to 0.9 cm. IMPRESSION: 1.  No evidence of hydronephrosis. 2. Bilateral simple renal cysts. Electronically Signed   By: Allegra Lai M.D.   On: 01/11/2022 20:33   DG Chest Port 1 View  Result Date: 01/11/2022 CLINICAL DATA:  Left hemothorax EXAM: PORTABLE CHEST 1 VIEW COMPARISON:  Previous studies including the examination done earlier today FINDINGS: There is interval placement of left chest tube with marked decrease in left pleural effusion. The small residual left pleural effusion with  blunting of left lateral CP angle. There is no pneumothorax. Cardiac size is in the upper limits of normal. There are no signs of alveolar pulmonary edema. There is slight prominence of interstitial markings in left lung which may be due to resolving atelectasis. Fractures are noted in the posterolateral aspects of left seventh and eighth ribs. There may be other adjacent fractures which are not distinctly visualized. IMPRESSION: There is marked interval decrease in left pleural effusion after placement of left chest tube. There is small residual left pleural effusion. Increased interstitial markings in the left parahilar region and left lower lung field may suggest underlying interstitial pneumonia or resolving atelectasis. There is no pneumothorax. Electronically Signed   By: [name] M.D.   On: 01/11/2022 17:20   DG Chest Portable 1 View  Result Date: 01/11/2022 CLINICAL DATA:  Shortness of breath EXAM: PORTABLE CHEST 1 VIEW COMPARISON:  08/28/2021 FINDINGS: Cardiomegaly with rightward shift of the mediastinal contents. Large left pleural effusion with near complete atelectasis or consolidation of the left lung. The right lung is normally aerated. IMPRESSION: 1. Large left pleural effusion with near complete atelectasis or consolidation of the left lung. 2.  Cardiomegaly. Electronically Signed   By: Jearld Lesch M.D.   On: 01/11/2022 12:04    Microbiology: Recent Results (from the past 240 hour(s))  Resp Panel by RT-PCR (Flu A&B, Covid) Anterior Nasal Swab     Status: None   Collection Time: 01/11/22 11:30 AM   Specimen: Anterior Nasal Swab  Result Value Ref Range Status   SARS Coronavirus 2 by RT PCR NEGATIVE NEGATIVE Final    Comment: (NOTE) SARS-CoV-2 target nucleic acids are NOT DETECTED.  The SARS-CoV-2 RNA is generally detectable in upper respiratory specimens during the acute phase of infection. The lowest concentration of SARS-CoV-2 viral copies this assay can detect is 138  copies/mL. A negative result does not preclude SARS-Cov-2 infection and should not be used as the sole basis for treatment or other patient management decisions. A negative result may occur with  improper specimen collection/handling, submission of specimen other than nasopharyngeal swab, presence of viral mutation(s) within the areas targeted by this assay, and inadequate number of viral copies(<138 copies/mL). A negative result must be combined with clinical observations, patient history, and epidemiological information. The expected result is Negative.  Fact Sheet for Patients:  BloggerCourse.com  Fact Sheet for Healthcare Providers:  SeriousBroker.it  This test is no t yet approved or cleared by the [place] FDA and  has been authorized for detection and/or diagnosis of SARS-CoV-2 by FDA under an Emergency Use Authorization (EUA). This EUA will remain  in effect (meaning this test can be used) for the duration of the COVID-19 declaration under Section 564(b)(1) of the Act, 21 U.S.C.section 360bbb-3(b)(1), unless the authorization is terminated  or revoked sooner.       Influenza A by PCR NEGATIVE NEGATIVE Final   Influenza B by PCR NEGATIVE NEGATIVE Final    Comment: (NOTE) The Xpert Xpress SARS-CoV-2/FLU/RSV plus assay is intended as an aid  in the diagnosis of influenza from Nasopharyngeal swab specimens and should not be used as a sole basis for treatment. Nasal washings and aspirates are unacceptable for Xpert Xpress SARS-CoV-2/FLU/RSV testing.  Fact Sheet for Patients: BloggerCourse.com  Fact Sheet for Healthcare Providers: SeriousBroker.it  This test is not yet approved or cleared by the [place] FDA and has been authorized for detection and/or diagnosis of SARS-CoV-2 by FDA under an Emergency Use Authorization (EUA). This EUA will remain in effect (meaning  this test can be used) for the duration of the COVID-19 declaration under Section 564(b)(1) of the Act, 21 U.S.C. section 360bbb-3(b)(1), unless the authorization is terminated or revoked.  Performed at [facility], [address] [place], [place] [address]   Body fluid culture w Gram Stain     Status: None   Collection Time: 01/11/22  4:20 PM   Specimen: Pleural Fluid  Result Value Ref Range Status   Specimen Description PLEURAL  Final   Special Requests NONE  Final   Gram Stain   Final    WBC PRESENT,BOTH PMN AND MONONUCLEAR NO ORGANISMS SEEN    Culture   Final    NO GROWTH 3 DAYS Performed at [facility], [address] [place], [place] [address]    Report Status 01/15/2022 FINAL  Final  MRSA Next Gen by PCR, Nasal     Status: None   Collection Time: 01/11/22 10:31 PM   Specimen: Nasal Mucosa; Nasal Swab  Result Value Ref Range Status   MRSA by PCR Next Gen NOT DETECTED NOT DETECTED Final    Comment: (NOTE) The GeneXpert MRSA Assay (FDA approved for NASAL specimens only), is one component of a comprehensive MRSA colonization surveillance program. It is not intended to diagnose MRSA infection nor to guide or monitor treatment for MRSA infections. Test performance is not FDA approved in patients less than 69 years old. Performed at [facility], [address], [place], [place] [address]      Labs: Basic Metabolic Panel: Recent Labs  Lab 01/12/22 0731 01/13/22 0014 01/14/22 0155 01/15/22 0150 01/16/22 0146 01/17/22 0148  NA 136 135 136 135 136 135  K 4.0 3.4* 3.7 4.0 3.8 3.9  CL 99 100 100 101 100 101  CO2 GLUCOSE 90 105* 107* 111* 96 97  BUN 47* 45* 29* 23 20 25*  CREATININE 2.53* 1.88* 1.46* 1.54* 1.30* 1.37*  CALCIUM 8.2* 8.1* 8.6* 8.4* 8.5* 8.5*  MG 2.1 2.0 2.1 2.0 2.0 2.1  PHOS 4.8*  --   --   --   --   --     Liver Function Tests: Recent Labs  Lab 01/11/22 1130 01/11/22 1832  AST 26  --   ALT 11  --    ALKPHOS 62  --   BILITOT 1.2  --   PROT 5.6* 6.2*  ALBUMIN 3.2*  --     No results for input(s): "LIPASE", "AMYLASE" in the last 168 hours. No results for input(s): "AMMONIA" in the last 168 hours. CBC: Recent Labs  Lab 01/11/22 1130 01/11/22 1832 01/13/22 0014 01/14/22 0155 01/15/22 0150 01/16/22 0146 01/17/22 0148  WBC 11.1*   < > 8.9 7.6 8.3 8.5 8.5  NEUTROABS 9.8*  --   --   --   --   --   --   HGB 9.7*   < > 10.7* 11.0* 11.3* 11.6* 10.9*  HCT 29.5*   < >  31.5* 32.5* 32.7* 33.8* 32.0*  MCV 104.2*   < > 92.9 93.7 93.7 92.6 93.6  PLT 164   < > 149* 168 185 201 219   < > = values in this interval not displayed.    Cardiac Enzymes: No results for input(s): "CKTOTAL", "CKMB", "CKMBINDEX", "TROPONINI" in the last 168 hours. BNP: BNP (last 3 results) Recent Labs    03/31/21 0406 04/01/21 0522 01/11/22 1130  BNP 240.7* 334.2* 226.3*     ProBNP (last 3 results) No results for input(s): "PROBNP" in the last 8760 hours.  CBG: Recent Labs  Lab 01/11/22 2223 01/11/22 2317  GLUCAP 105* 98        Signed:  Azucena Fallen DO   [facility] 01/17/2022, 7:43 AM

## 2022-01-17 NOTE — Progress Notes (Addendum)
Physical Therapy Treatment Patient Details Name: Eugene [name] MRN: [id number] DOB: 1935-11-10 Today's Date: 01/17/2022   History of Present Illness pt is an 86 y/o male admitted 8/17 with progressive SOB.  2 weeks ago fell on the edge of the bathtub and landed on his left ribcage.  Imaging showed Large L pleural effusion in the ED.  PMHx:  AAA, HLD, HTN, afib, stroke    PT Comments    Pt agreeable to tx with son present during session. Pt performed therapeutic activities as instructed with emphasis on sit to stands and static standing balance. Pt limited by left shoulder and rib pain, weakness, decreased balance, and fatigue. Pt with reduced effort during therapeutic exercises thereafter. Progress as tolerated.   Recommendations for follow up therapy are one component of a multi-disciplinary discharge planning process, led by the attending physician.  Recommendations may be updated based on patient status, additional functional criteria and insurance authorization.  Follow Up Recommendations  Acute inpatient rehab (3hours/day)     Assistance Recommended at Discharge Intermittent Supervision/Assistance  Patient can return home with the following A lot of help with walking and/or transfers;Two people to help with bathing/dressing/bathroom;Assistance with cooking/housework;Assist for transportation;Help with stairs or ramp for entrance   Equipment Recommendations  BSC/3in1 (also tbd at next venue)    Recommendations for Other Services Rehab consult     Precautions / Restrictions Precautions Precautions: Fall;Other (comment) Precaution Comments: pleural effusion and rib fractures (both on L side) Restrictions Weight Bearing Restrictions: No     Mobility  Bed Mobility Overal bed mobility: Needs Assistance Bed Mobility: Supine to Sit, Sit to Supine     Supine to sit: Mod assist, +2 for safety/equipment Sit to supine: Mod assist   General bed mobility comments: Pt required  cues for sequencing and hand placements for more efficient scooting towards EOB. Pt required more than reasonable time to complete supine to sit. Pt worked on static sitting balance- cues needed for more upright posture and to avoid retropulsion. Pt with difficulty utilizing L shoulder 2/2 pain.    Transfers Overall transfer level: Needs assistance Equipment used: Rolling walker (2 wheels) Transfers: Sit to/from Stand Sit to Stand: Mod assist, +2 physical assistance, +2 safety/equipment           General transfer comment: Pt performed 2 sit to stands from bed with use of rocking momentum strategy. Pt with unsteadiness when arising with flexed posture and decreased bilateral knee extension; cues provided to improve upon. Pt performed static standing balance for increased duration on second attempt as pt was wiped by NT. Decreased endurance overall.    Ambulation/Gait               General Gait Details: did not attempt as pt fatiguing with static standing balance   Stairs             Wheelchair Mobility    Modified Rankin (Stroke Patients Only)       Balance   Sitting-balance support: Feet supported, Single extremity supported, Bilateral upper extremity supported, No upper extremity supported (variable hand support throughout session) Sitting balance-Leahy Scale: Fair     Standing balance support: Reliant on assistive device for balance, Bilateral upper extremity supported Standing balance-Leahy Scale: Poor                              Cognition Arousal/Alertness: Awake/alert Behavior During Therapy: WFL for tasks assessed/performed Overall Cognitive Status: Within Functional  Limits for tasks assessed (basic mobility tasks)                                 General Comments: Pt with decreased level of effort with exercises. Relatively low motivation        Exercises General Exercises - Lower Extremity Hip Flexion/Marching: Right,  Left, 10 reps, Seated Toe Raises: Both, 10 reps, Seated (heel/toe raises)    General Comments General comments (skin integrity, edema, etc.): HR 110 and SpO2 95% on RA      Pertinent Vitals/Pain Pain Assessment Pain Assessment: 0-10 Pain Score: 6  Faces Pain Scale: Hurts whole lot Pain Location: L chest/rib cage and L shoulder Pain Descriptors / Indicators: Discomfort, Grimacing, Sore Pain Intervention(s): Monitored during session, Limited activity within patient's tolerance, Patient requesting pain meds-RN notified    Home Living                          Prior Function            PT Goals (current goals can now be found in the care plan section) Acute Rehab PT Goals Patient Stated Goal: home when appropriate PT Goal Formulation: With patient Time For Goal Achievement: 01/26/22 Potential to Achieve Goals: Good Progress towards PT goals: Progressing toward goals    Frequency    Min 5X/week      PT Plan Frequency needs to be updated    Co-evaluation              AM-PAC PT "6 Clicks" Mobility   Outcome Measure  Help needed turning from your back to your side while in a flat bed without using bedrails?: A Lot Help needed moving from lying on your back to sitting on the side of a flat bed without using bedrails?: A Lot Help needed moving to and from a bed to a chair (including a wheelchair)?: A Lot Help needed standing up from a chair using your arms (e.g., wheelchair or bedside chair)?: Total Help needed to walk in hospital room?: A Lot Help needed climbing 3-5 steps with a railing? : Total 6 Click Score: 10    End of Session Equipment Utilized During Treatment: Gait belt Activity Tolerance: Patient limited by pain;Patient limited by fatigue;Other (comment) (weakness) Patient left: with family/visitor present;in bed;with call bell/phone within reach;with bed alarm set Nurse Communication: Mobility status;Patient requests pain meds PT Visit  Diagnosis: Other abnormalities of gait and mobility (R26.89);Muscle weakness (generalized) (M62.81);Difficulty in walking, not elsewhere classified (R26.2)     Time: 6948-5462 PT Time Calculation (min) (ACUTE ONLY): 25 min  Charges:  $Therapeutic Activity: 23-37 mins                    Tana Coast, PT    [employer] 01/17/2022, 1:15 PM

## 2022-01-17 NOTE — Progress Notes (Signed)
Report called to [facility]. Patient will be going to 4[facility]11. RN verbalized understanding of report and no further questions.

## 2022-01-17 NOTE — Progress Notes (Signed)
Inpatient Rehabilitation Admission Medication Review by a Pharmacist  A complete drug regimen review was completed for this patient to identify any potential clinically significant medication issues.  High Risk Drug Classes Is patient taking? Indication by Medication  Antipsychotic Yes Seroquel- sleep  Anticoagulant No   Antibiotic No   Opioid Yes OxyIR- acute pain  Antiplatelet No   Hypoglycemics/insulin No   Vasoactive Medication Yes Lopressor- HTN Flomax- BPH Lasix- HTN  Chemotherapy No   Other Yes Lipitor- HLD Flonase- seasonal rhinitis Xanax- anxiety Keppra- seizure ppx Prilosec- GERD     Type of Medication Issue Identified Description of Issue Recommendation(s)  Drug Interaction(s) (clinically significant)     Duplicate Therapy     Allergy     No Medication Administration End Date     Incorrect Dose     Additional Drug Therapy Needed     Significant med changes from prior encounter (inform family/care partners about these prior to discharge).    Other  PTA meds: Apixaban Not a candidate for OP anticoagulation 2/2 Left hemothorax with significant blood loss requiring 3 separate transfusions while admitted on the acute care side.     Clinically significant medication issues were identified that warrant physician communication and completion of prescribed/recommended actions by midnight of the next day:  No   Time spent performing this drug regimen review (minutes):  30   Earle Reome BS, PharmD, BCPS Clinical Pharmacist 01/17/2022 12:59 PM  Contact: [phone] after 3 PM  "Be curious, not judgmental..." -Debbora Dus

## 2022-01-17 NOTE — TOC Transition Note (Signed)
Transition of Care [facility]) - CM/SW Discharge Note   Patient Details  Name: Eugene [name] MRN: [id number] Date of Birth: [birth year]  [facility] CM/SW Contact:  Lawerance Sabal, RN Phone Number: 01/17/2022, 2:08 PM   Clinical Narrative:    TOC informed that patient will DC to CIR. [facility] notified that patient will not DC to home at this time. No other TOC needs identified          Patient Goals and CMS Choice        Discharge Placement                       Discharge Plan and Services                                     Social Determinants of Health (SDOH) Interventions     Readmission Risk Interventions     No data to display

## 2022-01-17 NOTE — PMR Pre-admission (Signed)
PMR Admission Coordinator Pre-Admission Assessment  Patient: Eugene [name] is an 86 y.o., male MRN: [id number] DOB: [birth year] Height: 6' (182.9 cm) Weight: 85.5 kg  Insurance Information HMO:     PPO:      PCP:      IPA:      80/20:      OTHER:  PRIMARY: Medicare A/B      Policy#: [id number]      Subscriber: pt CM Name:       Phone#:      Fax#:  Pre-Cert#: verified Civil engineer, contracting:  Benefits:  Phone #:      Name:  Eff. Date: A/B 06/28/02     Deduct: $1600      Out of Pocket Max: n/a      Life Max: n/a CIR: 100%      SNF: 20 full days Outpatient: 80%     Co-Ins: 20% Home Health: 100%      Co-Pay:  DME: 80%     Co-Ins: 20% Providers: n/a SECONDARY: A[employer]      Policy#: [id number]     Phone#:   Financial Counselor:       Phone#:   The "Data Collection Information Summary" for patients in Inpatient Rehabilitation Facilities with attached "Privacy Act [place] Records" was provided and verbally reviewed with: Patient and Family  Emergency Contact Information Contact Information     Name Relation Home Work Mobile   McDaniel,Stephanie Daughter   [phone]   Shaquille, Janes [phone]     Evin, Loiseau   [phone]       Current Medical History  Patient Admitting Diagnosis: debility   History of Present Illness: Pt is an 86 y/o male with PMH of HTN, HLD, CVA in 2007, AAA s/p stent, Afib, HFpEF (last ECHO 50-55% in 2023), seizures, and hemachromatosis, follows with Dr. Lorenso Courier.  He was admitted to [facility] on 01/11/[facility] after falling and landing on his left rib cage with c/o SOB and rib pain.  Found to have large hemo/pneumothorax.  Received total 3 units PRBCs.  Chest tube d/c'd on 8/21 and repeat chest xrays stable.  Therapy evaluations were completed and pt was recommended for CIR.      Patient's medical record from [facility] has been reviewed by the rehabilitation admission coordinator and physician.  Past Medical History  Past  Medical History:  Diagnosis Date   AAA (abdominal aortic aneurysm) ([place]) 06/29/2007   stent graft   Aortic stenosis 09/05/2017   Mild AS/AR by echo 01/2017.   GERD (gastroesophageal reflux disease)    Hemochromatosis    Hyperlipidemia    Hypertension    Irritable bowel syndrome    Permanent atrial fibrillation (HCC)    Stroke (HCC)    Transient Ischemic Attack    Has the patient had major surgery during 100 days prior to admission? No  Family History   family history includes Heart disease in his father and mother; Hypertension in his mother.  Current Medications  Current Facility-Administered Medications:    acetaminophen (TYLENOL) tablet 650 mg, 650 mg, Oral, Q6H PRN **OR** acetaminophen (TYLENOL) suppository 650 mg, 650 mg, Rectal, Q6H PRN, Karmen Bongo, MD   albuterol (PROVENTIL) (2.5 MG/3ML) 0.083% nebulizer solution 2.5 mg, 2.5 mg, Nebulization, Q2H PRN, Karmen Bongo, MD   ALPRAZolam Duanne Moron) tablet 0.5 mg, 0.5 mg, Oral, BID, Karmen Bongo, MD, 0.5 mg at 01/17/22 0916   atorvastatin (LIPITOR) tablet 20 mg, 20 mg, Oral, Daily, Karmen Bongo, MD,  20 mg at 01/17/22 0916   bisacodyl (DULCOLAX) EC tablet 5 mg, 5 mg, Oral, Daily PRN, Karmen Bongo, MD   docusate sodium (COLACE) capsule 100 mg, 100 mg, Oral, BID, Karmen Bongo, MD, 100 mg at 01/17/22 0175   docusate sodium (COLACE) capsule 100 mg, 100 mg, Oral, BID PRN, Spero Geralds, MD   feeding supplement (BOOST / RESOURCE BREEZE) liquid 1 Container, 1 Container, Oral, TID BM, Julian Hy, DO, 1 Container at 01/17/22 1025   hydrALAZINE (APRESOLINE) injection 5 mg, 5 mg, Intravenous, Q4H PRN, Karmen Bongo, MD   levETIRAcetam (KEPPRA) tablet 500 mg, 500 mg, Oral, BID, Karmen Bongo, MD, 500 mg at 01/17/22 8527   metoprolol tartrate (LOPRESSOR) tablet 25 mg, 25 mg, Oral, BID, Julian Hy, DO, 25 mg at 01/17/22 7824   multivitamin with minerals tablet 1 tablet, 1 tablet, Oral, Daily, Julian Hy, DO, 1 tablet  at 01/17/22 2353   omeprazole (PRILOSEC) capsule 20 mg, 20 mg, Oral, Daily, Karmen Bongo, MD, 20 mg at 01/17/22 0916   ondansetron (ZOFRAN) tablet 4 mg, 4 mg, Oral, Q6H PRN **OR** ondansetron (ZOFRAN) injection 4 mg, 4 mg, Intravenous, Q6H PRN, Karmen Bongo, MD   Oral care mouth rinse, 15 mL, Mouth Rinse, PRN, Candee Furbish, MD   oxyCODONE (Oxy IR/ROXICODONE) immediate release tablet 5 mg, 5 mg, Oral, Q4H PRN, Karmen Bongo, MD, 5 mg at 01/16/22 1522   polyethylene glycol (MIRALAX / GLYCOLAX) packet 17 g, 17 g, Oral, Daily PRN, Karmen Bongo, MD   QUEtiapine (SEROQUEL) tablet 25 mg, 25 mg, Oral, QHS, Karmen Bongo, MD, 25 mg at 01/16/22 2142   sodium chloride flush (NS) 0.9 % injection 3 mL, 3 mL, Intravenous, Q12H, Karmen Bongo, MD, 3 mL at 01/17/22 0916   tamsulosin (FLOMAX) capsule 0.4 mg, 0.4 mg, Oral, QHS, Karmen Bongo, MD, 0.4 mg at 01/16/22 2141   tiZANidine (ZANAFLEX) tablet 2 mg, 2 mg, Oral, QHS PRN, Karmen Bongo, MD  Facility-Administered Medications Ordered in Other Encounters:    alteplase (CATHFLO ACTIVASE) injection 2 mg, 2 mg, Intracatheter, Once PRN, Magrinat, Virgie Dad, MD   sodium chloride 0.9 % injection 10 mL, 10 mL, Intravenous, PRN, Magrinat, Virgie Dad, MD, 10 mL at 11/25/14 1220  Patients Current Diet:  Diet Order             Diet - low sodium heart healthy           Diet regular Room service appropriate? Yes; Fluid consistency: Thin  Diet effective now                   Precautions / Restrictions Precautions Precautions: Fall, Other (comment) Precaution Comments: PE and rib fractures Restrictions Weight Bearing Restrictions: No   Has the patient had 2 or more falls or a fall with injury in the past year? Yes  Prior Activity Level Limited Community (1-2x/wk): pt ambulatory with RW, supervision level for mobility and ADLs, had  Prior Functional Level Self Care: Did the patient need help bathing, dressing, using the toilet or  eating? Independent  Indoor Mobility: Did the patient need assistance with walking from room to room (with or without device)? Independent  Stairs: Did the patient need assistance with internal or external stairs (with or without device)? Needed some help  Functional Cognition: Did the patient need help planning regular tasks such as shopping or remembering to take medications? Needed some help  Patient Information Are you of Hispanic, Latino/a,or Spanish origin?: A. No, not  of Hispanic, Latino/a, or Spanish origin What is your race?: A. White Do you need or want an interpreter to communicate with a doctor or health care staff?: 0. No  Patient's Response To:  Health Literacy and Transportation Is the patient able to respond to health literacy and transportation needs?: Yes Health Literacy - How often do you need to have someone help you when you read instructions, pamphlets, or other written material from your doctor or pharmacy?: Never In the past 12 months, has lack of transportation kept you from medical appointments or from getting medications?: No In the past 12 months, has lack of transportation kept you from meetings, work, or from getting things needed for daily living?: No  Home Assistive Devices / Equipment Home Equipment: Conservation officer, nature (2 wheels), Grab bars - tub/shower, Civil engineer, contracting - built in, Geneticist, molecular  Prior Device Use: Indicate devices/aids used by the patient prior to current illness, exacerbation or injury? Walker  Current Functional Level Cognition  Overall Cognitive Status: Within Functional Limits for tasks assessed Orientation Level: Oriented X4 General Comments: Follows all simple commands, oriented to self, place, time and situation.    Extremity Assessment (includes Sensation/Coordination)  Upper Extremity Assessment: Generalized weakness  Lower Extremity Assessment: Generalized weakness, Defer to PT evaluation    ADLs  Overall ADL's : Needs  assistance/impaired Eating/Feeding: Set up, Sitting Grooming: Set up, Sitting, Wash/dry face, Brushing hair Upper Body Bathing: Minimal assistance, Sitting Lower Body Bathing: Maximal assistance, Total assistance, +2 for physical assistance, +2 for safety/equipment, Sit to/from stand, Sitting/lateral leans Upper Body Dressing : Minimal assistance, Sitting Lower Body Dressing: Maximal assistance, Total assistance, +2 for physical assistance, +2 for safety/equipment, Cueing for safety, Sit to/from stand Toilet Transfer: +2 for safety/equipment, Moderate assistance, +2 for physical assistance, Ambulation, BSC/3in1 Toilet Transfer Details (indicate cue type and reason): Simulated trasnfer sit to stand. Toileting- Clothing Manipulation and Hygiene: Moderate assistance, +2 for physical assistance, +2 for safety/equipment, Sitting/lateral lean, Sit to/from stand Functional mobility during ADLs: Moderate assistance, +2 for safety/equipment, +2 for physical assistance General ADL Comments: Limited by pain, weakness and endurance.    Mobility  Overal bed mobility: Needs Assistance Bed Mobility: Supine to Sit Supine to sit: Mod assist Sit to supine: Max assist, +2 for physical assistance, +2 for safety/equipment General bed mobility comments: Received up in chair    Transfers  Overall transfer level: Needs assistance Equipment used: Rolling walker (2 wheels) Transfers: Sit to/from Stand Sit to Stand: Mod assist, +2 physical assistance, +2 safety/equipment Bed to/from chair/wheelchair/BSC transfer type:: Step pivot Step pivot transfers: Min assist, +2 safety/equipment General transfer comment: Stood from Chair x3 W/ +2 physical assist, cues for hand placement; Mod assist to control descent to sit back into chair. Decreased endurance overall    Ambulation / Gait / Stairs / Wheelchair Mobility  Ambulation/Gait Ambulation/Gait assistance: +2 safety/equipment, Min assist Gait Distance (Feet): 10  Feet Assistive device: Rolling walker (2 wheels) Gait Pattern/deviations: Step-through pattern, Decreased step length - right, Decreased step length - left, Trunk flexed General Gait Details: Cues for upright posture, and to self-monitor for activity tolerance; walked with RW to cabinet, and once there, able to let go of RW briefly with L UE to open cabinet    Posture / Balance Balance Overall balance assessment: Needs assistance Sitting-balance support: Feet supported, No upper extremity supported Sitting balance-Leahy Scale: Good Standing balance support: Bilateral upper extremity supported Standing balance-Leahy Scale: Poor    Special needs/care consideration N/a   Previous Home Environment (  from acute therapy documentation) Living Arrangements: Spouse/significant other Available Help at Discharge: Family, Personal care attendant, Available 24 hours/day Type of Home: House Home Layout: One level Home Access: Level entry Bathroom Shower/Tub: Multimedia programmer: Handicapped height Bathroom Accessibility: Yes Additional Comments: pt has 24/7 caregiver for himself and his wife that assists with all care  Discharge Living Setting Plans for Discharge Living Setting: Patient's home Type of Home at Discharge: House Discharge Home Layout: One level Discharge Home Access: Level entry Discharge Bathroom Shower/Tub: Walk-in shower Discharge Bathroom Toilet: Handicapped height Discharge Bathroom Accessibility: Yes How Accessible: Accessible via walker Does the patient have any problems obtaining your medications?: No  Social/Family/Support Systems Patient Roles: Spouse Contact Information: spouse has advanced dementia and has been moved into a care facility, per pt's children she will not return home, not sure how clear this is to patient yet Anticipated Caregiver: Roselyn Reef [phone] [place] ([place]) [phone] Anticipated Caregiver's Contact Information: see  above Ability/Limitations of Caregiver: n/a Caregiver Availability: 24/7 Discharge Plan Discussed with Primary Caregiver: Yes Is Caregiver In Agreement with Plan?: Yes Does Caregiver/Family have Issues with Lodging/Transportation while Pt is in Rehab?: No  Goals Patient/Family Goal for Rehab: PT/OT supervision, SLP n/a Expected length of stay: 6-9 days Additional Information: see above regarding pt's spouse Pt/Family Agrees to Admission and willing to participate: Yes Program Orientation Provided & Reviewed with Pt/Caregiver Including Roles  & Responsibilities: Yes  Decrease burden of Care through IP rehab admission: n/a  Possible need for SNF placement upon discharge: Not anticipated  Patient Condition: I have reviewed medical records from [facility], spoken with CM, and patient and son. I met with patient at the bedside and discussed via phone for inpatient rehabilitation assessment.  Patient will benefit from ongoing PT and OT, can actively participate in 3 hours of therapy a day 5 days of the week, and can make measurable gains during the admission.  Patient will also benefit from the coordinated team approach during an Inpatient Acute Rehabilitation admission.  The patient will receive intensive therapy as well as Rehabilitation physician, nursing, social worker, and care management interventions.  Due to safety, medication administration, pain management, and patient education the patient requires 24 hour a day rehabilitation nursing.  The patient is currently min to mod +2 with mobility and basic ADLs.  Discharge setting and therapy post discharge at home with home health is anticipated.  Patient has agreed to participate in the Acute Inpatient Rehabilitation Program and will admit today.  Preadmission Screen Completed By:  Michel Santee, PT, DPT 01/17/2022 11:25 AM ______________________________________________________________________   Discussed status with Dr. Ranell Patrick on 01/17/22   at 11:32 AM  and received approval for admission today.  Admission Coordinator:  Michel Santee, PT, DPT time 11:32 AM Sudie Grumbling 01/17/22    Assessment/Plan: Diagnosis: Debility Does the need for close, 24 hr/day Medical supervision in concert with the patient's rehab needs make it unreasonable for this patient to be served in a less intensive setting? Yes Co-Morbidities requiring supervision/potential complications: large pleural effusion, overweight, fatigue, hemochromatosis, atrial fibrillation, HTN Due to bladder management, bowel management, safety, skin/wound care, disease management, medication administration, pain management, and patient education, does the patient require 24 hr/day rehab nursing? Yes Does the patient require coordinated care of a physician, rehab nurse, PT, OT, and SLP to address physical and functional deficits in the context of the above medical diagnosis(es)? Yes Addressing deficits in the following areas: balance, endurance, locomotion, strength, transferring, bowel/bladder control, bathing, dressing, feeding,  grooming, toileting, and psychosocial support Can the patient actively participate in an intensive therapy program of at least 3 hrs of therapy 5 days a week? Yes The potential for patient to make measurable gains while on inpatient rehab is excellent Anticipated functional outcomes upon discharge from inpatient rehab: supervision PT, supervision OT, supervision SLP Estimated rehab length of stay to reach the above functional goals is: 10-12 days Anticipated discharge destination: Home 10. Overall Rehab/Functional Prognosis: excellent   MD Signature: Leeroy Cha, MD

## 2022-01-17 NOTE — Progress Notes (Addendum)
Inpatient Rehab Coordinator Note:  I met with patient at bedside to discuss CIR recommendations and goals/expectations of CIR stay.  We reviewed 3 hrs/day of therapy, physician follow up, and average length of stay 2 weeks (dependent upon progress) with goals of supervision.  Pt reluctant to agree to rehab admission at this time; feels that his wife needs him at home.  We discussed that his ability to assist/be there for his wife would be enhanced by a rehab admission.  I also discussed with pt's son who confirms that pt would have 24/7 caregivers at home but they could not provide the level of care that the patient is currently requiring.  Talked with Dr. Ranell Patrick who visited pt after I did and she states pt now appears agreeable to rehab admission.  I will meet with pt/son at bedside once son arrives to confirm.    Addendum: pt in agreement to proceed with rehab admission.  Dr. Avon Gully and [facility] aware and in agreement. Will plan to admit today.   Shann Medal, PT, DPT Admissions Coordinator [phone] 01/17/22  11:18 AM

## 2022-01-18 DIAGNOSIS — D62 Acute posthemorrhagic anemia: Secondary | ICD-10-CM

## 2022-01-18 DIAGNOSIS — R509 Fever, unspecified: Secondary | ICD-10-CM

## 2022-01-18 DIAGNOSIS — R5381 Other malaise: Secondary | ICD-10-CM | POA: Diagnosis not present

## 2022-01-18 DIAGNOSIS — N189 Chronic kidney disease, unspecified: Secondary | ICD-10-CM | POA: Diagnosis not present

## 2022-01-18 LAB — URINALYSIS, ROUTINE W REFLEX MICROSCOPIC
Bilirubin Urine: NEGATIVE
Glucose, UA: NEGATIVE mg/dL
Hgb urine dipstick: NEGATIVE
Ketones, ur: NEGATIVE mg/dL
Leukocytes,Ua: NEGATIVE
Nitrite: NEGATIVE
Protein, ur: NEGATIVE mg/dL
Specific Gravity, Urine: 1.011 (ref 1.005–1.030)
pH: 6 (ref 5.0–8.0)

## 2022-01-18 LAB — COMPREHENSIVE METABOLIC PANEL
ALT: 18 U/L (ref 0–44)
AST: 19 U/L (ref 15–41)
Albumin: 2.4 g/dL — ABNORMAL LOW (ref 3.5–5.0)
Alkaline Phosphatase: 50 U/L (ref 38–126)
Anion gap: 9 (ref 5–15)
BUN: 21 mg/dL (ref 8–23)
CO2: 25 mmol/L (ref 22–32)
Calcium: 8.4 mg/dL — ABNORMAL LOW (ref 8.9–10.3)
Chloride: 101 mmol/L (ref 98–111)
Creatinine, Ser: 1.35 mg/dL — ABNORMAL HIGH (ref 0.61–1.24)
GFR, Estimated: 52 mL/min — ABNORMAL LOW (ref >60)
Glucose, Bld: 99 mg/dL (ref 70–99)
Potassium: 3.6 mmol/L (ref 3.5–5.1)
Sodium: 135 mmol/L (ref 135–145)
Total Bilirubin: 1.4 mg/dL — ABNORMAL HIGH (ref 0.3–1.2)
Total Protein: 5.4 g/dL — ABNORMAL LOW (ref 6.5–8.1)

## 2022-01-18 LAB — CBC WITH DIFFERENTIAL/PLATELET
Abs Immature Granulocytes: 0.07 10*3/uL (ref 0.00–0.07)
Basophils Absolute: 0.1 10*3/uL (ref 0.0–0.1)
Basophils Relative: 1 %
Eosinophils Absolute: 0.3 10*3/uL (ref 0.0–0.5)
Eosinophils Relative: 3 %
HCT: 30.6 % — ABNORMAL LOW (ref 39.0–52.0)
Hemoglobin: 10.5 g/dL — ABNORMAL LOW (ref 13.0–17.0)
Immature Granulocytes: 1 %
Lymphocytes Relative: 10 %
Lymphs Abs: 0.9 10*3/uL (ref 0.7–4.0)
MCH: 31.8 pg (ref 26.0–34.0)
MCHC: 34.3 g/dL (ref 30.0–36.0)
MCV: 92.7 fL (ref 80.0–100.0)
Monocytes Absolute: 1 10*3/uL (ref 0.1–1.0)
Monocytes Relative: 11 %
Neutro Abs: 6.7 10*3/uL (ref 1.7–7.7)
Neutrophils Relative %: 74 %
Platelets: 214 10*3/uL (ref 150–400)
RBC: 3.3 MIL/uL — ABNORMAL LOW (ref 4.22–5.81)
RDW: 14.5 % (ref 11.5–15.5)
WBC: 9 10*3/uL (ref 4.0–10.5)
nRBC: 0 % (ref 0.0–0.2)

## 2022-01-18 LAB — IRON AND TIBC
Iron: 23 ug/dL — ABNORMAL LOW (ref 45–182)
Saturation Ratios: 13 % — ABNORMAL LOW (ref 17.9–39.5)
TIBC: 183 ug/dL — ABNORMAL LOW (ref 250–450)
UIBC: 160 ug/dL

## 2022-01-18 LAB — MAGNESIUM: Magnesium: 2 mg/dL (ref 1.7–2.4)

## 2022-01-18 LAB — FOLATE: Folate: 29.3 ng/mL (ref >5.9)

## 2022-01-18 LAB — VITAMIN B12: Vitamin B-12: 313 pg/mL (ref 180–914)

## 2022-01-18 MED ORDER — FERROUS SULFATE 325 (65 FE) MG PO TABS
325.0000 mg | ORAL_TABLET | Freq: Every day | ORAL | Status: DC
Start: 2022-01-19 — End: 2022-01-26
  Administered 2022-01-19 – 2022-01-26 (×8): 325 mg via ORAL
  Filled 2022-01-18 (×8): qty 1

## 2022-01-18 NOTE — Progress Notes (Signed)
Patient ID: Eugene [name], male   DOB: 1936-02-26, 86 y.o.   MRN: [id number] Met with the patient to review current situation, rehab process, team conference and plan of care. Reviewed skin issues and plan to change to LALM and add pressure relief cushion to w/c. Medication for bowel and bladder management; MD checking for UTI, hx of UTIs. Reviewed secondary risks for debility including A-fib, CFR, HTN, HLD along with medications and dietary modification recommendations with nutritional supplement options. Reviewed need for daily weights and monitoring fluid overload. Continue to follow along to discharge to address educational needs to facilitate preparation for discharge. Margarito Liner

## 2022-01-18 NOTE — Progress Notes (Signed)
Occupational Therapy Assessment and Plan  Patient Details  Name: Eugene [name] MRN: [id number] Date of Birth: Nov 06, 1935  OT Diagnosis: acute pain and muscle weakness (generalized) Rehab Potential: Rehab Potential (ACUTE ONLY): Good ELOS: 14-17 days   Today's Date: 01/18/2022 OT Individual Time: 4098-1191 OT Individual Time Calculation (min): 69 min     Hospital Problem: Principal Problem:   Debility   Past Medical History:  Past Medical History:  Diagnosis Date   AAA (abdominal aortic aneurysm) ([place]) 06/29/2007   stent graft   Aortic stenosis 09/05/2017   Mild AS/AR by echo 01/2017.   GERD (gastroesophageal reflux disease)    Hemochromatosis    Hyperlipidemia    Hypertension    Irritable bowel syndrome    Permanent atrial fibrillation (HCC)    Stroke (HCC)    Transient Ischemic Attack   Past Surgical History:  Past Surgical History:  Procedure Laterality Date   ABDOMINAL AORTIC ANEURYSM REPAIR     ENDOVASCULAR STENT INSERTION  07/18/2007   saccular infrarenal aortic aneurysm   EYE SURGERY Bilateral    Cataract     IR FLUORO GUIDE CV LINE LEFT  12/08/2018   IR FLUORO GUIDE CV LINE RIGHT  02/13/2017   IR FLUORO GUIDE CV LINE RIGHT  02/25/2017   IR US GUIDE VASC ACCESS RIGHT  02/13/2017   PICC LINE PLACE PERIPHERAL ([place] [facility])  Oct 21, 2014    Assessment & Plan Clinical Impression: Patient is a 86 y.o. year old male with recent admission to the hospital on 01/11/22 with 1-2 weeks history of progressive SOB as well as reports of fall onto side of bathtub. He was found to have large left hemothorax, volume overload, ABLA and elevated troponin. He refused BIPAP. Pigtail catheter placed left chest with 2.5 L of bloody fluid and ABLA treated with 3 units PRBC secondary to low BP/hypoxia.Patient transferred to CIR on 01/17/2022 .    Patient currently requires max/total with basic self-care skills secondary to muscle weakness and muscle joint tightness, decreased cardiorespiratoy  endurance, and decreased sitting balance, decreased standing balance, decreased postural control, and decreased balance strategies.  Prior to hospitalization, patient could complete BADL with modified independent .  Patient will benefit from skilled intervention to increase independence with basic self-care skills prior to discharge home with care partner.  Anticipate patient will require 24 hour supervision and minimal physical assistance and follow up home health.  OT - End of Session Endurance Deficit: Yes Endurance Deficit Description: Pt required multiple rest breaks within BADL tasks, very deconditioned OT Assessment Rehab Potential (ACUTE ONLY): Good OT Patient demonstrates impairments in the following area(s): Balance;Behavior;Endurance;Motor;Pain;Safety;Skin Integrity OT Basic ADL's Functional Problem(s): Eating;Grooming;Bathing;Dressing;Toileting OT Transfers Functional Problem(s): Toilet;Tub/Shower OT Additional Impairment(s): None OT Plan OT Intensity: Minimum of 1-2 x/day, 45 to 90 minutes OT Frequency: 5 out of 7 days OT Duration/Estimated Length of Stay: 14-17 days OT Treatment/Interventions: Balance/vestibular training;Community reintegration;Cognitive remediation/compensation;Discharge planning;Functional mobility training;DME/adaptive equipment instruction;Disease mangement/prevention;Functional electrical stimulation;Pain management;Patient/family education;Psychosocial support;Neuromuscular re-education;Self Care/advanced ADL retraining;Therapeutic Activities;Skin care/wound managment;Splinting/orthotics;Therapeutic Exercise;UE/LE Strength taining/ROM;UE/LE Coordination activities;Visual/perceptual remediation/compensation;Wheelchair propulsion/positioning OT Self Feeding Anticipated Outcome(s): Set-up OT Basic Self-Care Anticipated Outcome(s): CGA/supervision OT Toileting Anticipated Outcome(s): CGA/supervision OT Bathroom Transfers Anticipated Outcome(s): CGA OT  Recommendation Patient destination: Home Follow Up Recommendations: Home health OT Equipment Recommended: To be determined Equipment Details: May need a 3-in-1 BSC  OT Evaluation Precautions/Restrictions  Precautions Precautions: Fall Precaution Comments: Rib fractures on L side Restrictions Weight Bearing Restrictions: No Pain  9/10 pain in back and ribs Home  Living/Prior Functioning Home Living Family/patient expects to be discharged to:: Private residence Living Arrangements: Spouse/significant other Available Help at Discharge: Family, Personal care attendant, Available 24 hours/day Type of Home: House Chief Financial Officer built for senior citizens with disabilities") Home Access: Level entry [place]: One level Bathroom Shower/Tub: Walk-in shower (They have two- One walk-in shower and one tub/shower unit) Bathroom Toilet: Handicapped height Bathroom Accessibility: Yes Additional Comments: Patient reports he has a caregiver for himself and his wife who is able to assist with all care/needs  Lives With: Spouse (Spouse has dementia) IADL History Homemaking Responsibilities: No Prior Function Level of Independence: Independent with basic ADLs, Requires assistive device for independence, Needs assistance with homemaking  Able to Take Stairs?: No Driving: No 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 Leisure: Hobbies-yes (Comment) (Used to play golf) Vision Baseline Vision/History: 1 Wears glasses ("to read only") Ability to See in Adequate Light: 0 Adequate Patient Visual Report: No change from baseline Vision Assessment?: No apparent visual deficits Perception  Perception: Within Functional Limits Praxis Praxis: Intact Cognition Cognition Overall Cognitive Status: Within Functional Limits for tasks assessed Arousal/Alertness: Lethargic Memory: Impaired Memory Impairment: Decreased short term memory (Per Education officer, museum- Patient was notified that his  wife was moved to a memory care unit yesterday, however when communicating with therapist he stated she was still at home. Impaired short-term memory noted.) Decreased Short Term Memory: Verbal basic Attention: Focused;Sustained;Selective Focused Attention: Appears intact Sustained Attention: Impaired Sustained Attention Impairment: Verbal basic Selective Attention: Appears intact Awareness: Impaired Awareness Impairment: Intellectual impairment Problem Solving: Impaired Problem Solving Impairment: Verbal basic Executive Function: Decision Making;Reasoning Reasoning: Impaired Behaviors: Verbal agitation;Poor frustration tolerance Safety/Judgment: Impaired Brief Interview for Mental Status (BIMS) Repetition of Three Words (First Attempt): 3 Temporal Orientation: Year: Correct Temporal Orientation: Month: Accurate within 5 days Temporal Orientation: Day: Correct Recall: "Sock": Yes, no cue required Recall: "Blue": Yes, no cue required Recall: "Bed": No, could not recall BIMS Summary Score: 13 Sensation Sensation Light Touch: Appears Intact Hot/Cold: Not tested Proprioception: Appears Intact Stereognosis: Not tested Coordination Gross Motor Movements are Fluid and Coordinated: Yes Fine Motor Movements are Fluid and Coordinated: Yes Motor  Motor Motor: Within Functional Limits Motor - Skilled Clinical Observations: Patient noted to be rigid with functional mobility tasks.  Trunk/Postural Assessment  Cervical Assessment Cervical Assessment: Exceptions to [facility] (Flexed posturing with downward gaze- Able perform extension, however reports increased pain secondary to sustain neck flexion.) Thoracic Assessment Thoracic Assessment: Exceptions to [facility] (Significantly flexed thoracic spine with rounded shoulders- Flexible with cues and once sitting in whelechair) Lumbar Assessment Lumbar Assessment: Exceptions to [facility] (Posterior tilt) Postural Control Postural Control: Deficits on  evaluation (Patient demonstrating significant R lateral lean secondary to reports of L-sided pain and needing to off-weight. Patient unable to support his trunk with R UE and required assistance especially with sustained sitting secondary to fatigue.)  Balance Balance Balance Assessed: Yes Static Sitting Balance Static Sitting - Balance Support: Feet supported;Bilateral upper extremity supported Static Sitting - Level of Assistance: 4: Min assist Static Sitting - Comment/# of Minutes: R lateral lean Static Standing Balance Static Standing - Balance Support: Bilateral upper extremity supported Static Standing - Level of Assistance: 3: Mod assist Static Standing - Comment/# of Minutes: with RW Extremity/Trunk Assessment RUE Assessment RUE Assessment: Exceptions to [facility] Strength Comments: Limited shoulder ROM at baseline ~100 degrees of FF LUE Assessment LUE Assessment: Exceptions to [facility] Strength Comments: Limited shoulder ROM at baseline ~100 degrees of FF  Care Tool Care  Tool Self Care Eating   Eating Assist Level: Supervision/Verbal cueing    Oral Care    Oral Care Assist Level: Minimal Assistance - Patient > 75%    Bathing   Body parts bathed by patient: Right arm;Left arm;Chest;Abdomen Body parts bathed by helper: Front perineal area;Buttocks;Right upper leg;Left upper leg;Right lower leg;Left lower leg;Face   Assist Level: Maximal Assistance - Patient 24 - 49%    Upper Body Dressing(including orthotics)   What is the patient wearing?: Pull over shirt   Assist Level: Moderate Assistance - Patient 50 - 74%    Lower Body Dressing (excluding footwear)   What is the patient wearing?: Pants;Incontinence brief Assist for lower body dressing: Total Assistance - Patient < 25%    Putting on/Taking off footwear   What is the patient wearing?: Non-skid slipper socks Assist for footwear: Dependent - Patient 0%       Care Tool Toileting Toileting activity  Toileting Activity did not occur (Clothing management and hygiene only): N/A (no void or bm)       Care Tool Bed Mobility Roll left and right activity   Roll left and right assist level: Total Assistance - Patient < 25%    Sit to lying activity Sit to lying activity did not occur: Safety/medical concerns Sit to lying assist level: Maximal Assistance - Patient 25 - 49%    Lying to sitting on side of bed activity Lying to sitting on side of bed activity did not occur: the ability to move from lying on the back to sitting on the side of the bed with no back support.: Safety/medical concerns Lying to sitting on side of bed assist level: the ability to move from lying on the back to sitting on the side of the bed with no back support.: Total Assistance - Patient < 25%     Care Tool Transfers Sit to stand transfer   Sit to stand assist level: Maximal Assistance - Patient 25 - 49%    Chair/bed transfer Chair/bed transfer activity did not occur: Safety/medical concerns Chair/bed transfer assist level: Total Assistance - Patient < 25%     Toilet transfer Toilet transfer activity did not occur: Safety/medical concerns       Care Tool Cognition  Expression of Ideas and Wants Expression of Ideas and Wants: 4. Without difficulty (complex and basic) - expresses complex messages without difficulty and with speech that is clear and easy to understand  Understanding Verbal and Non-Verbal Content Understanding Verbal and Non-Verbal Content: 4. Understands (complex and basic) - clear comprehension without cues or repetitions   Memory/Recall Ability Memory/Recall Ability : Current season;That he or she is in a hospital/hospital unit;Staff names and faces   Refer to Care Plan for Long Term Goals  SHORT TERM GOAL WEEK 1 OT Short Term Goal 1 (Week 1): Patient will complete sit<>stand with mod A in preparation for BADL tasks OT Short Term Goal 2 (Week 1): Patient will tolerate standing at the sink for 1  minute during BADL task. OT Short Term Goal 3 (Week 1): Patient will complete toilet transfer with mod A.  Recommendations for other services: None    Skilled Therapeutic Intervention OT eval completed addressing rehab process, OT purpose, POC, ELOS, and goals.  Patient performed BADL tasks from EOB with max/total A. He tolerated standing 1x with max A and RW, but was unable to achieve full hip and trunk extension. See below for further details regarding BADL performance.   ADL ADL Eating: Supervision/safety  Grooming: Minimal assistance Upper Body Bathing: Moderate assistance Lower Body Bathing: Maximal assistance Upper Body Dressing: Moderate assistance Lower Body Dressing: Dependent Toileting: Unable to assess Mobility  Bed Mobility Bed Mobility: Rolling Right;Rolling Left;Supine to Sit;Sitting - Scoot to Edge of Bed;Sit to Supine Rolling Right: Total Assistance - Patient < 25% Rolling Left: Total Assistance - Patient < 25% Supine to Sit: Maximal Assistance - Patient - Patient 25-49% Sitting - Scoot to Edge of Bed: Maximal Assistance - Patient 25-49% Sit to Supine: Maximal Assistance - Patient 25-49% Transfers Sit to Stand: Maximal Assistance - Patient 25-49% Stand to Sit: Maximal Assistance - Patient 25-49%  Discharge Criteria: Patient will be discharged from OT if patient refuses treatment 3 consecutive times without medical reason, if treatment goals not met, if there is a change in medical status, if patient makes no progress towards goals or if patient is discharged from hospital.  The above assessment, treatment plan, treatment alternatives and goals were discussed and mutually agreed upon: by patient  Valma Cava 01/18/2022, 3:15 PM

## 2022-01-18 NOTE — Progress Notes (Signed)
PROGRESS NOTE   Subjective/Complaints: Pt in bed this AM. No new concerns or complaints. After I saw him this am, nursing reported he had coughing with drinking water.   Review of Systems  Constitutional:  Negative for chills, fever and weight loss.  Eyes:  Negative for double vision.  Respiratory:  Negative for cough.   Cardiovascular:  Negative for chest pain and palpitations.  Gastrointestinal: Negative.   Genitourinary: Negative.   Neurological:  Positive for weakness.    Objective:   DG Chest 2 View  Result Date: 01/17/2022 CLINICAL DATA:  Fever. EXAM: CHEST - 2 VIEW COMPARISON:  Radiograph yesterday. FINDINGS: Retrocardiac opacity likely represents a combination of pleural effusion and airspace disease/atelectasis. Tiny left apical pneumothorax is stable. Mild right infrahilar atelectasis, similar. Stable heart size and mediastinal contours. Aortic atherosclerosis. IMPRESSION: 1. Unchanged tiny left apical pneumothorax. 2. Retrocardiac opacity likely combination of pleural effusion and airspace disease/atelectasis. Electronically Signed   By: Narda Rutherford M.D.   On: 01/17/2022 22:14   DG CHEST PORT 1 VIEW  Result Date: 01/16/2022 CLINICAL DATA:  Pneumothorax. EXAM: PORTABLE CHEST 1 VIEW COMPARISON:  Chest 01/15/2022 FINDINGS: Left chest tube removed. Tiny left apical pneumothorax. Left lower lobe airspace disease and small left effusion unchanged. Mild right lower lobe atelectasis unchanged. Negative for heart failure. Extensive atherosclerotic calcification IMPRESSION: Tiny left apical pneumothorax following left chest tube removal. Left lower lobe airspace disease and left effusion unchanged Electronically Signed   By: Marlan Palau M.D.   On: 01/16/2022 09:01   Recent Labs    01/17/22 2035 01/18/22 0529  WBC 10.3 9.0  HGB 11.3* 10.5*  HCT 32.9* 30.6*  PLT 249 214   Recent Labs    01/17/22 0148 01/18/22 0529   NA 135 135  K 3.9 3.6  CL 101 101  CO2 26 25  GLUCOSE 97 99  BUN 25* 21  CREATININE 1.37* 1.35*  CALCIUM 8.5* 8.4*    Intake/Output Summary (Last 24 hours) at 01/18/2022 3762 Last data filed at 01/17/2022 8315 Gross per 24 hour  Intake 100 ml  Output --  Net 100 ml     Pressure Injury 01/11/22 Coccyx Medial Deep Tissue Pressure Injury - Purple or maroon localized area of discolored intact skin or blood-filled blister due to damage of underlying soft tissue from pressure and/or shear. DTI sacrum (Active)  01/11/22 2235  Location: Coccyx  Location Orientation: Medial  Staging: Deep Tissue Pressure Injury - Purple or maroon localized area of discolored intact skin or blood-filled blister due to damage of underlying soft tissue from pressure and/or shear.  Wound Description (Comments): DTI sacrum  Present on Admission: Yes    Physical Exam: Vital Signs Blood pressure 114/66, pulse (!) 110, temperature 99.1 F (37.3 C), temperature source Oral, resp. rate 19, height 6' (1.829 m), weight 82.5 kg, SpO2 92 %.    Assessment/Plan: 1. Functional deficits which require 3+ hours per day of interdisciplinary therapy in a comprehensive inpatient rehab setting. Physiatrist is providing close team supervision and 24 hour management of active medical problems listed below. Physiatrist and rehab team continue to assess barriers to discharge/monitor patient progress toward functional and medical goals  Care Tool:  Bathing              Bathing assist       Upper Body Dressing/Undressing Upper body dressing   What is the patient wearing?: Hospital gown only    Upper body assist Assist Level: Minimal Assistance - Patient > 75%    Lower Body Dressing/Undressing Lower body dressing            Lower body assist       Toileting Toileting    Toileting assist       Transfers Chair/bed transfer  Transfers assist           Locomotion Ambulation   Ambulation  assist              Walk 10 feet activity   Assist           Walk 50 feet activity   Assist           Walk 150 feet activity   Assist           Walk 10 feet on uneven surface  activity   Assist           Wheelchair     Assist               Wheelchair 50 feet with 2 turns activity    Assist            Wheelchair 150 feet activity     Assist          Blood pressure 114/66, pulse (!) 110, temperature 99.1 F (37.3 C), temperature source Oral, resp. rate 19, height 6' (1.829 m), weight 82.5 kg, SpO2 92 %.   Gen: no distress, normal appearing, fatiged HEENT: oral mucosa pink and moist, NCAT Cardio: Reg rate, mild tachy, impaired endurance Chest: normal effort, normal rate of breathing Abd: soft, non-distended Ext: no edema Psych: pleasant, normal affect, decreased motivation Skin: intact Neurological:     Mental Status: He is alert and oriented to person, place, and time. Diffuse weakness, left shoulder limited by pain    Medical Problem List and Plan: 1. Functional deficits secondary to pulmonary debility             -patient may shower             -ELOS/Goals: 10-14 days             -Continue CIR  -Will consult SLP due to coughing with drinking water for swallow eval 2.  Antithrombotics: -DVT/anticoagulation:  Mechanical: Sequential compression devices, below knee Bilateral lower extremities             -antiplatelet therapy: N/A 3. L shoulder pain: add voltaren gel prn. Oxycodone prn.  4. Mood/Behavior/Sleep: LCSW to follow for evaluation and support.              -antipsychotic agents: N/A 5. Neuropsych/cognition: This patient is capable of making decisions on his own behalf. 6. Skin/Wound Care: Routine pressure relief measures.              --foam dressing to DTI w/chronic tissue damage.              --continue nutritional supplements.  7. Fluids/Electrolytes/Nutrition: Monitor I/O. Check CMET in am              --continue nutritional supplements. 8. Left hemothorax: has resolved. Pulmonary hygiene.             --follow up with Pulmonary  in 5 weeks w/CxR. 9. A fib: Monitor HR TID--continue Metoprolol BID  -appears to be mildly tachycardic chronically, continue to monitor, consider increase dose metoprolol 10. ABLA: Stable overall. Recheck CBC in am.             --h/o hemochromatosis.   -HGB stable 10.5 on 8/24, continue to monitor 11. H/o depression w/anxiety: Continue Xanax 0.5 mg bid w/ Seroquel 25 mg/HS. 12. H/o Seizure d/o: Continue Keppra bid.  13. Acute on chronic renal failure: Baseline SCr 1.27-1.3. Lasix on hold.              --has improved form 39/3.05-->25/1.37  -scr stable at 1.35 on 8/24             --encourage fluid intake. 14. Airspace disease on CXR: incentive spirometer ordered. 15. Pleural effusion: daily weights ordered to monitor for fluid overload while off of Lasix.  16. Fever of 100.8: ordered BC, UC, UA, procalcitonin, CBC, and CXR.  -WBC 9.0, procalcitonin neg, U/A pending, CXR appears similar to prior, UA ordered, no growth on blood cx, U/A and culture reordered he does appear to have incontinence.Has been afebrile since this time, continue to follow 17. Fatigue: ordered iron, B12, and folate with morning labs      LOS: 1 days A FACE TO FACE EVALUATION WAS PERFORMED  Fanny Dance 01/18/2022, 8:07 AM

## 2022-01-18 NOTE — Plan of Care (Signed)
  Problem: Sit to Stand Goal: LTG:  Patient will perform sit to stand with assistance level (PT) Description: LTG:  Patient will perform sit to stand with assistance level (PT) Flowsheets (Taken 01/18/2022 1158) LTG: PT will perform sit to stand in preparation for functional mobility with assistance level: Minimal Assistance - Patient > 75%   Problem: RH Bed Mobility Goal: LTG Patient will perform bed mobility with assist (PT) Description: LTG: Patient will perform bed mobility with assistance, with/without cues (PT). Flowsheets (Taken 01/18/2022 1158) LTG: Pt will perform bed mobility with assistance level of: Minimal Assistance - Patient > 75%   Problem: RH Bed to Chair Transfers Goal: LTG Patient will perform bed/chair transfers w/assist (PT) Description: LTG: Patient will perform bed to chair transfers with assistance (PT). Flowsheets (Taken 01/18/2022 1158) LTG: Pt will perform Bed to Chair Transfers with assistance level: Minimal Assistance - Patient > 75%   Problem: RH Car Transfers Goal: LTG Patient will perform car transfers with assist (PT) Description: LTG: Patient will perform car transfers with assistance (PT). Flowsheets (Taken 01/18/2022 1158) LTG: Pt will perform car transfers with assist:: Minimal Assistance - Patient > 75%   Problem: RH Ambulation Goal: LTG Patient will ambulate in home environment (PT) Description: LTG: Patient will ambulate in home environment, # of feet with assistance (PT). Flowsheets (Taken 01/18/2022 1158) LTG: Pt will ambulate in home environ  assist needed:: Minimal Assistance - Patient > 75% LTG: Ambulation distance in home environment: 25'   Problem: RH Wheelchair Mobility Goal: LTG Patient will propel w/c in controlled environment (PT) Description: LTG: Patient will propel wheelchair in controlled environment, # of feet with assist (PT) Flowsheets (Taken 01/18/2022 1158) LTG: Pt will propel w/c in controlled environ  assist needed::  Supervision/Verbal cueing LTG: Propel w/c distance in controlled environment: 150' Goal: LTG Patient will propel w/c in home environment (PT) Description: LTG: Patient will propel wheelchair in home environment, # of feet with assistance (PT). Flowsheets (Taken 01/18/2022 1158) LTG: Pt will propel w/c in home environ  assist needed:: Supervision/Verbal cueing Distance: wheelchair distance in controlled environment: 25

## 2022-01-18 NOTE — Progress Notes (Signed)
[facility] Individual Statement of Services  Patient Name:  Eugene [name]  Date:  01/18/2022  Welcome [facility] the [facility].  Our goal is to provide you with an individualized program based on your diagnosis and situation, designed to meet your specific needs.  With this comprehensive rehabilitation program, you will be expected to participate in at least 3 hours of rehabilitation therapies Monday-Friday, with modified therapy programming on the weekends.  Your rehabilitation program will include the following services:  Physical Therapy (PT), Occupational Therapy (OT), 24 hour per day rehabilitation nursing, Neuropsychology, Care Coordinator, Rehabilitation Medicine, Nutrition Services, and Pharmacy Services  Weekly team conferences will be held on Wednesday to discuss your progress.  Your Inpatient Rehabilitation Care Coordinator will talk with you frequently to get your input and to update you on team discussions.  Team conferences with you and your family in attendance may also be held.  Expected length of stay: 14-17 days  Overall anticipated outcome: supervision-min assist level  Depending on your progress and recovery, your program may change. Your Inpatient Rehabilitation Care Coordinator will coordinate services and will keep you informed of any changes. Your Inpatient Rehabilitation Care Coordinator's name and contact numbers are listed  below.  The following services may also be recommended but are not provided by the [facility]:   Home Health Rehabiltiation Services Outpatient Rehabilitation Services    Arrangements will be made to provide these services after discharge if needed.  Arrangements include referral to agencies that provide these services.  Your insurance has been verified to be:  Medicare & [employer] Your primary doctor is:  [name]  Pertinent information will be shared with your doctor and your  insurance company.  Inpatient Rehabilitation Care Coordinator:  Dossie Der, [place] [phone] or Luna [place]  Information discussed with and copy given to patient by: Lucy Chris, 01/18/2022, 11:21 AM

## 2022-01-18 NOTE — Plan of Care (Signed)
  Problem: RH Balance Goal: LTG: Patient will maintain dynamic sitting balance (OT) Description: LTG:  Patient will maintain dynamic sitting balance with assistance during activities of daily living (OT) Flowsheets (Taken 01/18/2022 1454) LTG: Pt will maintain dynamic sitting balance during ADLs with: Supervision/Verbal cueing Goal: LTG Patient will maintain dynamic standing with ADLs (OT) Description: LTG:  Patient will maintain dynamic standing balance with assist during activities of daily living (OT)  Flowsheets (Taken 01/18/2022 1454) LTG: Pt will maintain dynamic standing balance during ADLs with: Contact Guard/Touching assist   Problem: Sit to Stand Goal: LTG:  Patient will perform sit to stand in prep for activites of daily living with assistance level (OT) Description: LTG:  Patient will perform sit to stand in prep for activites of daily living with assistance level (OT) Flowsheets (Taken 01/18/2022 1454) LTG: PT will perform sit to stand in prep for activites of daily living with assistance level: Contact Guard/Touching assist   Problem: RH Grooming Goal: LTG Patient will perform grooming w/assist,cues/equip (OT) Description: LTG: Patient will perform grooming with assist, with/without cues using equipment (OT) Flowsheets (Taken 01/18/2022 1454) LTG: Pt will perform grooming with assistance level of: Independent with assistive device    Problem: RH Bathing Goal: LTG Patient will bathe all body parts with assist levels (OT) Description: LTG: Patient will bathe all body parts with assist levels (OT) Flowsheets (Taken 01/18/2022 1454) LTG: Pt will perform bathing with assistance level/cueing: Contact Guard/Touching assist   Problem: RH Dressing Goal: LTG Patient will perform upper body dressing (OT) Description: LTG Patient will perform upper body dressing with assist, with/without cues (OT). Flowsheets (Taken 01/18/2022 1454) LTG: Pt will perform upper body dressing with assistance  level of: Supervision/Verbal cueing Goal: LTG Patient will perform lower body dressing w/assist (OT) Description: LTG: Patient will perform lower body dressing with assist, with/without cues in positioning using equipment (OT) Flowsheets (Taken 01/18/2022 1454) LTG: Pt will perform lower body dressing with assistance level of: Contact Guard/Touching assist   Problem: RH Toilet Transfers Goal: LTG Patient will perform toilet transfers w/assist (OT) Description: LTG: Patient will perform toilet transfers with assist, with/without cues using equipment (OT) Flowsheets (Taken 01/18/2022 1454) LTG: Pt will perform toilet transfers with assistance level of: Contact Guard/Touching assist   Problem: RH Tub/Shower Transfers Goal: LTG Patient will perform tub/shower transfers w/assist (OT) Description: LTG: Patient will perform tub/shower transfers with assist, with/without cues using equipment (OT) Flowsheets (Taken 01/18/2022 1454) LTG: Pt will perform tub/shower stall transfers with assistance level of: Contact Guard/Touching assist

## 2022-01-18 NOTE — Progress Notes (Signed)
Physical Therapy Assessment and Plan  Patient Details  Name: Eugene [name] MRN: [id number] Date of Birth: 1936-02-08  PT Diagnosis: Abnormal posture, Abnormality of gait, Cognitive deficits, Difficulty walking, Impaired cognition, Low back pain, Muscle weakness, and Pain in low back/neck, impaired skin integrity Rehab Potential: Fair ELOS: 12-15 days   Today's Date: 01/18/2022 PT Individual Time: 1st Treatment Session: 1015-1130; 2nd Treatment Session: 1300-1405 PT Individual Time Calculation (min): 75 min; 65 min  Hospital Problem: Principal Problem:   Debility   Past Medical History:  Past Medical History:  Diagnosis Date   AAA (abdominal aortic aneurysm) ([place]) 06/29/2007   stent graft   Aortic stenosis 09/05/2017   Mild AS/AR by echo 01/2017.   GERD (gastroesophageal reflux disease)    Hemochromatosis    Hyperlipidemia    Hypertension    Irritable bowel syndrome    Permanent atrial fibrillation (HCC)    Stroke (HCC)    Transient Ischemic Attack   Past Surgical History:  Past Surgical History:  Procedure Laterality Date   ABDOMINAL AORTIC ANEURYSM REPAIR     ENDOVASCULAR STENT INSERTION  07/18/2007   saccular infrarenal aortic aneurysm   EYE SURGERY Bilateral    Cataract     IR FLUORO GUIDE CV LINE LEFT  12/08/2018   IR FLUORO GUIDE CV LINE RIGHT  02/13/2017   IR FLUORO GUIDE CV LINE RIGHT  02/25/2017   IR US GUIDE VASC ACCESS RIGHT  02/13/2017   PICC LINE PLACE PERIPHERAL ([place] [facility])  Oct 21, 2014    Assessment & Plan Clinical Impression: Patient is an 86 year old male with history of A fib-on eliquis, IBS, HTN, hereditary hemochromatosis who was admitted on 01/11/22 with 1-2 weeks history of progressive SOB as well as reports of fall onto side of bathtub. He was found to have large left hemothorax, volume overload, ABLA and elevated troponin. He refused BIPAP. Pigtail catheter placed left chest with 2.5 L of bloody fluid and ABLA treated with 3 units PRBC secondary to  low BP/hypoxia.   Elquis d/c and CT surgery consulted for input and felt that there was no indication for VATS. AKI due to ATN is improving and recommendations are to hold Eliquis till follo wup with PCP/cardiology on outpatient basis to determine risks v/s benefits.  Sleep study recommended due to family concerns of OSA but he has declined study due to claustrophobia. Bloody drainage has resolved and CT removed on 08/21. DTI/Chronic tissue damage from incontinence/fall treated with silicon foam, male purewick  and LALM. Feels very fatigued.   Patient currently requires max/total assist with mobility secondary to muscle weakness and muscle joint tightness, decreased cardiorespiratoy endurance, decreased problem solving, decreased safety awareness, and decreased memory, and decreased sitting balance, decreased standing balance, decreased postural control, and decreased balance strategies.  Prior to hospitalization, patient was independent  with mobility and lived with Spouse (Spouse has dementia) in a Tourist information centre manager built for senior citizens with disabilities") home.  Home access is  Level entry.  Patient will benefit from skilled PT intervention to maximize safe functional mobility, minimize fall risk, and decrease caregiver burden for planned discharge home with 24 hour assist.  Anticipate patient will benefit from follow up [facility] at discharge.  PT - End of Session Activity Tolerance: Tolerates < 10 min activity, no significant change in vital signs Endurance Deficit: Yes Endurance Deficit Description: Patient with poor activity tolerance requiring several rest breaks throughout treatment session. PT Assessment Rehab Potential (ACUTE/IP ONLY): Fair PT Barriers to Discharge:  Incontinence;Behavior PT Barriers to Discharge Comments: Patient has a good discharge plan, however his progress will be limited by his self-limiting behaviors PT Patient demonstrates impairments in the following area(s):  Balance;Pain;Behavior;Safety;Endurance;Motor;Skin Integrity PT Transfers Functional Problem(s): Bed Mobility;Bed to Chair;Car PT Locomotion Functional Problem(s): Ambulation;Wheelchair Mobility;Stairs PT Plan PT Intensity: Minimum of 1-2 x/day ,45 to 90 minutes PT Frequency: 5 out of 7 days PT Duration Estimated Length of Stay: 12-15 days PT Treatment/Interventions: Ambulation/gait training;Cognitive remediation/compensation;Discharge planning;DME/adaptive equipment instruction;Functional mobility training;Pain management;Psychosocial support;Therapeutic Activities;UE/LE Strength taining/ROM;Balance/vestibular training;Community reintegration;Disease management/prevention;Functional electrical stimulation;Neuromuscular re-education;Patient/family education;Skin care/wound management;Stair training;Therapeutic Exercise;UE/LE Coordination activities;Wheelchair propulsion/positioning PT Transfers Anticipated Outcome(s): MinA with RW PT Locomotion Anticipated Outcome(s): MinA with RW for short-distances; Supervision for wheelchair mobility up to 50' PT Recommendation Recommendations for Other Services: Neuropsych consult Follow Up Recommendations: Home health PT Patient destination: Home Equipment Details: TBD- Patient owns RW, [facility] and [facility]   PT Evaluation Precautions/Restrictions Precautions Precautions: Fall Precaution Comments: Rib fractures on L side Restrictions Weight Bearing Restrictions: No Pain Interference Pain Interference Pain Effect on Sleep: 2. Occasionally Pain Interference with Therapy Activities: 2. Occasionally Pain Interference with Day-to-Day Activities: 2. Occasionally Home Living/Prior Functioning Home Living Living Arrangements: Spouse/significant other Available Help at Discharge: Family;Personal care attendant;Available 24 hours/day Type of Home: House Chief Financial Officer built for senior citizens with disabilities") Home Access: Level entry Home Layout: One  level Bathroom Shower/Tub: Walk-in shower (They have two- One walk-in shower and one tub/shower unit) Bathroom Toilet: Handicapped height Bathroom Accessibility: Yes Additional Comments: Patient reports he has a caregiver for himself and his wife who is able to assist with all care/needs  Lives With: Spouse (Spouse has dementia) Prior Function Level of Independence: Independent with gait;Independent with basic ADLs;Independent with homemaking with ambulation;Independent with transfers;Requires assistive device for independence  Able to Take Stairs?: No Driving: No 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 Leisure: Hobbies-yes (Comment) (Used to play golf) Vision/Perception  Vision - History Ability to See in Adequate Light: 0 Adequate Perception Perception: Within Functional Limits Praxis Praxis: Intact  Cognition Overall Cognitive Status: Within Functional Limits for tasks assessed Arousal/Alertness: Lethargic Orientation Level: Oriented X4 Attention: Focused;Sustained;Selective Focused Attention: Appears intact Sustained Attention: Impaired Sustained Attention Impairment: Verbal basic Selective Attention: Appears intact Memory: Impaired Memory Impairment: Decreased short term memory (Per Education officer, museum- Patient was notified that his wife was moved to a memory care unit yesterday, however when communicating with therapist he stated she was still at home. Impaired short-term memory noted.) Decreased Short Term Memory: Verbal basic Awareness: Impaired Awareness Impairment: Intellectual impairment Problem Solving: Impaired Problem Solving Impairment: Verbal basic Executive Function: Decision Making;Reasoning Reasoning: Impaired Behaviors: Verbal agitation;Poor frustration tolerance Safety/Judgment: Impaired Sensation Sensation Light Touch: Appears Intact Hot/Cold: Not tested Proprioception: Appears Intact Stereognosis: Not tested Coordination Gross  Motor Movements are Fluid and Coordinated: Yes Fine Motor Movements are Fluid and Coordinated: Yes Motor  Motor Motor: Within Functional Limits Motor - Skilled Clinical Observations: Patient noted to be rigid with functional mobility tasks.   Trunk/Postural Assessment  Cervical Assessment Cervical Assessment: Exceptions to [facility] (Flexed posturing with downward gaze- Able perform extension, however reports increased pain secondary to sustain neck flexion.) Thoracic Assessment Thoracic Assessment: Exceptions to [facility] (Significantly flexed thoracic spine with rounded shoulders- Flexible with cues and once sitting in whelechair) Lumbar Assessment Lumbar Assessment: Exceptions to [facility] (Posterior tilt) Postural Control Postural Control: Deficits on evaluation (Patient demonstrating significant R lateral lean secondary to reports of L-sided pain and needing to off-weight. Patient unable to support his trunk with  R UE and required assistance especially with sustained sitting secondary to fatigue.)  Balance Balance Balance Assessed: Yes Static Sitting Balance Static Sitting - Balance Support: Feet supported;Bilateral upper extremity supported Static Sitting - Level of Assistance: 4: Min assist Static Sitting - Comment/# of Minutes: R lateral lean Static Standing Balance Static Standing - Balance Support: Bilateral upper extremity supported Static Standing - Level of Assistance: 3: Mod assist Static Standing - Comment/# of Minutes: with RW Extremity Assessment      RLE Assessment General Strength Comments: R LE strength assessed with functional mobility- R LE grossly 3/5 with patient report of being limited by pain/aches LLE Assessment General Strength Comments: L LE strength assessed with functional mobility- L LE grossly 3/5 with patient report of being limited by pain/aches  Care Tool Care Tool Bed Mobility Roll left and right activity   Roll left and right assist level: Total Assistance -  Patient < 25%    Sit to lying activity   Sit to lying assist level: Maximal Assistance - Patient 25 - 49%    Lying to sitting on side of bed activity   Lying to sitting on side of bed assist level: the ability to move from lying on the back to sitting on the side of the bed with no back support.: Total Assistance - Patient < 25%     Care Tool Transfers Sit to stand transfer   Sit to stand assist level: Maximal Assistance - Patient 25 - 49%    Chair/bed transfer Chair/bed transfer activity did not occur: Safety/medical concerns Chair/bed transfer assist level: Maximal Assistance - Patient 25 - 49%     Toilet transfer Toilet transfer activity did not occur: Safety/medical concerns      Geneticist, molecular transfer assist level: Maximal Assistance - Patient 25 - 49%      Care Tool Locomotion Ambulation Ambulation activity did not occur: Safety/medical concerns (Unable to perform ambulation and stair mobility secondary to poor endurance/activity tolerance and global weakness.)        Walk 10 feet activity Walk 10 feet activity did not occur: Safety/medical concerns       Walk 50 feet with 2 turns activity Walk 50 feet with 2 turns activity did not occur: Safety/medical concerns      Walk 150 feet activity Walk 150 feet activity did not occur: Safety/medical concerns      Walk 10 feet on uneven surfaces activity Walk 10 feet on uneven surfaces activity did not occur: Safety/medical concerns      Stairs Stair activity did not occur: Safety/medical concerns        Walk up/down 1 step activity Walk up/down 1 step or curb (drop down) activity did not occur: Safety/medical concerns      Walk up/down 4 steps activity Walk up/down 4 steps activity did not occur: Safety/medical concerns      Walk up/down 12 steps activity Walk up/down 12 steps activity did not occur: Safety/medical concerns      Pick up small objects from floor Pick up small object from the floor (from  standing position) activity did not occur: Safety/medical concerns      Wheelchair Is the patient using a wheelchair?: Yes Type of Wheelchair: Manual   Wheelchair assist level: Total Assistance - Patient < 25% Max wheelchair distance: Patient able to propel ~20' and then required total assistance for the remaining 130'  Wheel 50 feet with 2 turns activity   Assist Level: Maximal Assistance - Patient 25 -  49%  Wheel 150 feet activity   Assist Level: Total Assistance - Patient < 25%    Refer to Care Plan for Long Term Goals  SHORT TERM GOAL WEEK 1 PT Short Term Goal 1 (Week 1): Patient will complete bed mobility with moderate assistance PT Short Term Goal 2 (Week 1): Patient will perform sit/stand with RW and moderate assistance PT Short Term Goal 3 (Week 1): Patient will perform stand pivot transfer with RW and moderate assistance  Recommendations for other services: Neuropsych  Skilled Therapeutic Intervention Treatment Session #1: Patient greeted supine in bed and agreeable to PT treatment session. Patient reporting 6/10 pain at rest in the bed in his neck, low back and L rib cage- RN notified and was able to administer pain medication toward the end of treatment session. Patient presents with decreased motivation, agitation/frustration and self limiting behaviors throughout treatment session. Patient required a significant amount of time to complete all functional mobility tasks with extended rest breaks in between secondary to poor endurance/activity tolerance.   Patient rolled left and right with total assistance from therapist- Patient was able to reach UE across his body and initiate knee flexion, however due to "aching all over" he was unable to fully assist in rolling left and right. Patient was able to transition from supine to sitting EOB with maximal assistance- Patient initiated placing B LE off the bed, however required max assistance for righting trunk and fully placing B LE  off the bed. Once sitting EOB, patient demonstrated a significant R lateral lean with flexed head/neck and rounded shoulders. Patient with a notable R lateral weight shift secondary to L-sided pain, however he was unable to successfully support himself with R UE due to decreased strength and poor endurance. Patient tolerated sitting EOB ~2 minutes before requesting to lie back down. Patient transitioned from sitting EOB to supine with MaxA for B LE and trunk management. Patient required total assistance for readjusting once supine.   After extended supine rest break, patient transitioned from supine to sitting EOB with MaxA (detailed above). While sitting EOB, therapist sat on patient's R side in order to provide UE support for improved balance and posturing. Patient required ~8+ minutes to attempt to stand with the use of a RW- On each attempt to lift bottom, patient was able to scoot closer toward the EOB. On final attempt, therapist facilitated an anterior weight shift and MaxA for standing with RW. Once standing with RW, patient reporting "'I'm going to fall!" Therapist reassuring patient throughout transfer and once in alignment with wheelchair patient required MaxA for control eccentric decent into wheelchair.   Patient propelled manual wheelchair in rehab gym ~20' prior to stating "you'll have to do the rest." Therapist provided total assistance for >20' of wheelchair mobility. While patient was performing wc mobility, RN and NT switched patient's current bed out for an air bed in order to improve overall skin integrity and inhibit worsening of current wounds.    Patient returned to his room sitting upright in wheelchair with roho cushion,with call bell within reach, posey belt on, daughter present and all needs met.     Treatment Session #2:  Patient greeted sitting upright in wheelchair in room with daughter present and agreeable to PT treatment session. Patient reporting significant fatigue  this afternoon, however willing to go to the ortho gym.  Patient completed x2 sit/stands with RW and MaxA- Facilitation for anterior weight shift and verbal/tactile cues for tucking B feet underneath him and proper  hand placement. Once standing, patient demonstrates significantly flexed posturing with downward gaze, flexed neck, hip flexion and knee flexion. Verbal/tactile cues and facilitation for improved postural extension, however minimal improvements noted. Patient given verbal cues for looking at other therapist in gym in order to promote extension, however patient only able to sustain ~5 seconds secondary to complaints of neck pain. After each standing, attempted gait however patient was unable to demonstrate enough postural extension for appropriate gait mechanics.   Patient performed stand pivot car transfer with RW and MaxA- Verbal/tactile cues and facilitation for anterior weight shift and proper hand/foot positioning. Patient with flexed posturing throughout transfer with small shuffling steps toward the car, however fatigued quickly and required MaxA to ensure his bottom landed safely on the seat as the car since patient's posture started to sink lower and he was unable to correct. Once seated in the car, patient required MaxA for placing B LE into/out of the car. Extended rest break required while sitting in car. Patient transferred back to the wheelchair with RW and MaxA for lifting and safety- Patient was able to safely make it to the wheelchair prior to sitting.   Once in his room, patient attempted stand pivot transfer to bed with RW and MaxA, however once he started to take a step his posturing became increasingly flexed and was assisted back to a seated position in the wheelchair. In order to ensure safety, therapist performed a TotalA squat pivot transfer. Patient transitioned from sitting EOB to supine with MaxA. Therapist assisted with positioning pillows under UE/LE per patient request,  however only one pillow placed under patient's head in order to facilitate neutral alignment of head/neck. Patient and daughter educated on improved positioning in the bed.    Patient left supine in bed with call bell within reach, daughter present, RN notified, table nearby, all 4 rails up and all needs met.     Discharge Criteria: Patient will be discharged from PT if patient refuses treatment 3 consecutive times without medical reason, if treatment goals not met, if there is a change in medical status, if patient makes no progress towards goals or if patient is discharged from hospital.  The above assessment, treatment plan, treatment alternatives and goals were discussed and mutually agreed upon: by patient  [place] 01/18/2022, 12:01 PM

## 2022-01-18 NOTE — Progress Notes (Signed)
Inpatient Rehabilitation  Patient information reviewed and entered into eRehab system by Kennadee Walthour M. Hiilani Jetter, M.A., CCC/SLP, PPS Coordinator.  Information including medical coding, functional ability and quality indicators will be reviewed and updated through discharge.    

## 2022-01-18 NOTE — Progress Notes (Signed)
Inpatient Rehabilitation Care Coordinator Assessment and Plan Patient Details  Name: Eugene [name] MRN: [id number] Date of Birth: 1935-11-22  Today's Date: 01/18/2022  Hospital Problems: Principal Problem:   Debility  Past Medical History:  Past Medical History:  Diagnosis Date   AAA (abdominal aortic aneurysm) (HCC) 06/29/2007   stent graft   Aortic stenosis 09/05/2017   Mild AS/AR by echo 01/2017.   GERD (gastroesophageal reflux disease)    Hemochromatosis    Hyperlipidemia    Hypertension    Irritable bowel syndrome    Permanent atrial fibrillation (HCC)    Stroke (HCC)    Transient Ischemic Attack   Past Surgical History:  Past Surgical History:  Procedure Laterality Date   ABDOMINAL AORTIC ANEURYSM REPAIR     ENDOVASCULAR STENT INSERTION  07/18/2007   saccular infrarenal aortic aneurysm   EYE SURGERY Bilateral    Cataract     IR FLUORO GUIDE CV LINE LEFT  12/08/2018   IR FLUORO GUIDE CV LINE RIGHT  02/13/2017   IR FLUORO GUIDE CV LINE RIGHT  02/25/2017   IR US GUIDE VASC ACCESS RIGHT  02/13/2017   PICC LINE PLACE PERIPHERAL ([facility] HX)  Oct 21, 2014   Social History:  reports that he quit smoking about 22 years ago. His smoking use included cigarettes. He has a 40.00 pack-year smoking history. He has never used smokeless tobacco. He reports current alcohol use of about 1.0 - 2.0 standard drink of alcohol per week. He reports that he does not use drugs.  Family / Support Systems Marital Status: Married Patient Roles: Spouse, Parent Spouse/Significant Other: [name] has dementia just placed at [facility] 8/23 Children: Azalee Course [phone]  Hadley Pen [phone] Other Supports: Private caregivers Anticipated Caregiver: private duty caregivers 24/7 and two children will monitor this Ability/Limitations of Caregiver: hired private duty caregivers 24/7 Caregiver Availability: 24/7 Family Dynamics: Close with children and is down due to wife being  placed in memory care yesterday. Children very involved and will make arrangements for whatever pt requires at DC  Social History Preferred language: English Religion: Methodist Cultural Background: No issues Education: Charity fundraiser - How often do you need to have someone help you when you read instructions, pamphlets, or other written material from your doctor or pharmacy?: Never Writes: Yes Employment Status: Retired Marine scientist Issues: No issues Guardian/Conservator: None-according to MD pt is capable of making his own decisions, but his daughter is POA and will include her in any decisions needing to be made while here   Abuse/Neglect Abuse/Neglect Assessment Can Be Completed: Yes Physical Abuse: Denies Verbal Abuse: Denies Sexual Abuse: Denies Exploitation of patient/patient's resources: Denies Self-Neglect: Denies  Patient response to: Social Isolation - How often do you feel lonely or isolated from those around you?: Never  Emotional Status Pt's affect, behavior and adjustment status: Pt is not feeling well today and somewhat cranky. His wife was placed in memory care yesterday and she will not be home when he gets there. He wants to feel better and get where he can move around without pain but also aware it will take time to heal and his ribs will hurt for a while Recent Psychosocial Issues: other health issues Psychiatric History: History of depression takes medications for this and finds it helpful but would benefit from seeing neuro-psych while here, due to changes and his wife issues. Substance Abuse History: No issues  Patient / Family Perceptions, Expectations & Goals Pt/Family understanding of illness & functional limitations:  Pt and daughter can explain his fall and injuries as a result. They talk with the MD and feel they are aware of the treatment plan moving forward and his questions have been addressed. Premorbid pt/family  roles/activities: husband, father, grandfather, retiree, church member, etc Anticipated changes in roles/activities/participation: resume Pt/family expectations/goals: Pt states: " I want to feel better and not be in pain when I try to move."  Daughter states: " We need him to get to a one person assist, if can move with a assistive device"  [employer]: Other (Comment) Premorbid Home Care/DME Agencies: Other (Comment) (has lift chair, rw, wc, bsc, riser grab bars around toliet and tub seat needs grab bars in shower. Active with [facility]) Transportation available at discharge: family and caregiver Is the patient able to respond to transportation needs?: Yes In the past 12 months, has lack of transportation kept you from medical appointments or from getting medications?: No In the past 12 months, has lack of transportation kept you from meetings, work, or from getting things needed for daily living?: No Resource referrals recommended: Neuropsychology  Discharge Planning Living Arrangements: Spouse/significant other Support Systems: Spouse/significant other, Children, Friends/neighbors, Other (Comment) (hired caregiver) Type of Residence: Private residence Community education officer Resources: [employer], Media planner (specify) Building services engineer) Financial Resources: Restaurant manager, fast food Screen Referred: No Living Expenses: Own Money Management: Patient, Family Does the patient have any problems obtaining your medications?: No Home Management: caregiver Patient/Family Preliminary Plans: Return home with hired caregiver 24/7. His wife was placed yesterday at [facility] care and will not be home when discharged. Made aware team is evaluating today and setting goals. Care Coordinator Barriers to Discharge: Insurance for SNF coverage Care Coordinator Anticipated Follow Up Needs: HH/OP  Clinical Impression Pleasant yet cranky gentleman who is in hurting and not wanting to move  much. His two children are involved and visit often. His daughter-Stephanie is his POA will keep her in the loop. Pt needs to reach one person care since they have 24/7 hired caregiver who will be there with him. Will await therapists evaluations and have neuro-psych see while here for coping.  Lucy Chris 01/18/2022, 11:18 AM

## 2022-01-18 NOTE — Progress Notes (Signed)
Unable to collect specimen, patient was incontinent through the night.

## 2022-01-18 NOTE — Progress Notes (Signed)
PMR Admission Coordinator Pre-Admission Assessment   Patient: Eugene [name] is an 86 y.o., male MRN: [id number] DOB: [birth year] Height: 6' (182.9 cm) Weight: 85.5 kg   Insurance Information HMO:     PPO:      PCP:      IPA:      80/20:      OTHER:  PRIMARY: Medicare A/B      Policy#: [id number]      Subscriber: pt CM Name:       Phone#:      Fax#:  Pre-Cert#: verified Civil engineer, contracting:  Benefits:  Phone #:      Name:  Eff. Date: A/B 06/28/02     Deduct: $1600      Out of Pocket Max: n/a      Life Max: n/a CIR: 100%      SNF: 20 full days Outpatient: 80%     Co-Ins: 20% Home Health: 100%      Co-Pay:  DME: 80%     Co-Ins: 20% Providers: n/a SECONDARY: AARP      Policy#: [id number]     Phone#:    Financial Counselor:       Phone#:    The "Data Collection Information Summary" for patients in Inpatient Rehabilitation Facilities with attached "Privacy Act [place] Records" was provided and verbally reviewed with: Patient and Family   Emergency Contact Information Contact Information       Name Relation Home Work Mobile    McDaniel,Stephanie Daughter     [phone]    Dalessandro, Baldyga [phone]        Aking, Klabunde     [phone]           Current Medical History  Patient Admitting Diagnosis: debility    History of Present Illness: Pt is an 86 y/o male with PMH of HTN, HLD, CVA in 2007, AAA s/p stent, Afib, HFpEF (last ECHO 50-55% in 2023), seizures, and hemachromatosis, follows with Dr. Lorenso Courier.  He was admitted to [facility] on 01/11/22 after falling and landing on his left rib cage with c/o SOB and rib pain.  Found to have large hemo/pneumothorax.  Received total 3 units PRBCs.  Chest tube d/c'd on 8/21 and repeat chest xrays stable.  Therapy evaluations were completed and pt was recommended for CIR.     [facility]'s [facility] record from [facility] has been reviewed by the rehabilitation admission coordinator and physician.   Past Medical  History      Past Medical History:  Diagnosis Date   AAA (abdominal aortic aneurysm) ([place]) 06/29/2007    stent graft   Aortic stenosis 09/05/2017    Mild AS/AR by echo 01/2017.   GERD (gastroesophageal reflux disease)     Hemochromatosis     Hyperlipidemia     Hypertension     Irritable bowel syndrome     Permanent atrial fibrillation (HCC)     Stroke (HCC)      Transient Ischemic Attack      Has the patient had major surgery during 100 days prior to admission? No   Family History   family history includes Heart disease in his father and mother; Hypertension in his mother.   Current Medications   Current Facility-Administered Medications:    acetaminophen (TYLENOL) tablet 650 mg, 650 mg, Oral, Q6H PRN **OR** acetaminophen (TYLENOL) suppository 650 mg, 650 mg, Rectal, Q6H PRN, Karmen Bongo, MD   albuterol (PROVENTIL) (2.5 MG/3ML) 0.083% nebulizer solution 2.5 mg, 2.5  mg, Nebulization, Q2H PRN, Karmen Bongo, MD   ALPRAZolam Duanne Moron) tablet 0.5 mg, 0.5 mg, Oral, BID, Karmen Bongo, MD, 0.5 mg at 01/17/22 3818   atorvastatin (LIPITOR) tablet 20 mg, 20 mg, Oral, Daily, Karmen Bongo, MD, 20 mg at 01/17/22 2993   bisacodyl (DULCOLAX) EC tablet 5 mg, 5 mg, Oral, Daily PRN, Karmen Bongo, MD   docusate sodium (COLACE) capsule 100 mg, 100 mg, Oral, BID, Karmen Bongo, MD, 100 mg at 01/17/22 7169   docusate sodium (COLACE) capsule 100 mg, 100 mg, Oral, BID PRN, Spero Geralds, MD   feeding supplement (BOOST / RESOURCE BREEZE) liquid 1 Container, 1 Container, Oral, TID BM, Julian Hy, DO, 1 Container at 01/17/22 6789   hydrALAZINE (APRESOLINE) injection 5 mg, 5 mg, Intravenous, Q4H PRN, Karmen Bongo, MD   levETIRAcetam (KEPPRA) tablet 500 mg, 500 mg, Oral, BID, Karmen Bongo, MD, 500 mg at 01/17/22 3810   metoprolol tartrate (LOPRESSOR) tablet 25 mg, 25 mg, Oral, BID, Julian Hy, DO, 25 mg at 01/17/22 1751   multivitamin with minerals tablet 1 tablet, 1 tablet, Oral,  Daily, Julian Hy, DO, 1 tablet at 01/17/22 0258   omeprazole (PRILOSEC) capsule 20 mg, 20 mg, Oral, Daily, Karmen Bongo, MD, 20 mg at 01/17/22 0916   ondansetron (ZOFRAN) tablet 4 mg, 4 mg, Oral, Q6H PRN **OR** ondansetron (ZOFRAN) injection 4 mg, 4 mg, Intravenous, Q6H PRN, Karmen Bongo, MD   Oral care mouth rinse, 15 mL, Mouth Rinse, PRN, Candee Furbish, MD   oxyCODONE (Oxy IR/ROXICODONE) immediate release tablet 5 mg, 5 mg, Oral, Q4H PRN, Karmen Bongo, MD, 5 mg at 01/16/22 1522   polyethylene glycol (MIRALAX / GLYCOLAX) packet 17 g, 17 g, Oral, Daily PRN, Karmen Bongo, MD   QUEtiapine (SEROQUEL) tablet 25 mg, 25 mg, Oral, QHS, Karmen Bongo, MD, 25 mg at 01/16/22 2142   sodium chloride flush (NS) 0.9 % injection 3 mL, 3 mL, Intravenous, Q12H, Karmen Bongo, MD, 3 mL at 01/17/22 0916   tamsulosin (FLOMAX) capsule 0.4 mg, 0.4 mg, Oral, QHS, Karmen Bongo, MD, 0.4 mg at 01/16/22 2141   tiZANidine (ZANAFLEX) tablet 2 mg, 2 mg, Oral, QHS PRN, Karmen Bongo, MD   Facility-Administered Medications Ordered in Other Encounters:    alteplase (CATHFLO ACTIVASE) injection 2 mg, 2 mg, Intracatheter, Once PRN, Magrinat, Virgie Dad, MD   sodium chloride 0.9 % injection 10 mL, 10 mL, Intravenous, PRN, Magrinat, Virgie Dad, MD, 10 mL at 11/25/14 1220   Patients Current Diet:  Diet Order                  Diet - low sodium heart healthy             Diet regular Room service appropriate? Yes; Fluid consistency: Thin  Diet effective now                         Precautions / Restrictions Precautions Precautions: Fall, Other (comment) Precaution Comments: PE and rib fractures Restrictions Weight Bearing Restrictions: No    Has the patient had 2 or more falls or a fall with injury in the past year? Yes   Prior Activity Level Limited Community (1-2x/wk): pt ambulatory with RW, supervision level for mobility and ADLs, had   Prior Functional Level Self Care: Did the patient  need help bathing, dressing, using the toilet or eating? Independent   Indoor Mobility: Did the patient need assistance with walking from room to room (  of Hispanic, Latino/a, or Spanish origin What is your race?: A. White Do you need or want an interpreter to communicate with a doctor or health care staff?: 0. No  Patient's Response To:  Health Literacy and Transportation Is the patient able to respond to health literacy and transportation needs?: Yes Health Literacy - How often do you need to have someone help you when you read instructions, pamphlets, or other written material from your doctor or pharmacy?: Never In the past 12 months, has lack of transportation kept you from medical appointments or from getting medications?: No In the past 12 months, has lack of transportation kept you from meetings, work, or from getting things needed for daily living?: No  Home Assistive Devices / Equipment Home Equipment: Rolling Walker (2 wheels), Grab bars - tub/shower, Shower seat - built in, Toilet riser  Prior Device Use: Indicate devices/aids used by the patient prior to current illness, exacerbation or injury? Walker  Current Functional Level Cognition  Overall Cognitive Status: Within Functional Limits for tasks assessed Orientation Level: Oriented X4 General Comments: Follows all simple commands, oriented to self, place, time and situation.    Extremity Assessment (includes Sensation/Coordination)  Upper Extremity Assessment: Generalized weakness  Lower Extremity Assessment: Generalized weakness, Defer to PT evaluation    ADLs  Overall ADL's : Needs  assistance/impaired Eating/Feeding: Set up, Sitting Grooming: Set up, Sitting, Wash/dry face, Brushing hair Upper Body Bathing: Minimal assistance, Sitting Lower Body Bathing: Maximal assistance, Total assistance, +2 for physical assistance, +2 for safety/equipment, Sit to/from stand, Sitting/lateral leans Upper Body Dressing : Minimal assistance, Sitting Lower Body Dressing: Maximal assistance, Total assistance, +2 for physical assistance, +2 for safety/equipment, Cueing for safety, Sit to/from stand Toilet Transfer: +2 for safety/equipment, Moderate assistance, +2 for physical assistance, Ambulation, BSC/3in1 Toilet Transfer Details (indicate cue type and reason): Simulated trasnfer sit to stand. Toileting- Clothing Manipulation and Hygiene: Moderate assistance, +2 for physical assistance, +2 for safety/equipment, Sitting/lateral lean, Sit to/from stand Functional mobility during ADLs: Moderate assistance, +2 for safety/equipment, +2 for physical assistance General ADL Comments: Limited by pain, weakness and endurance.    Mobility  Overal bed mobility: Needs Assistance Bed Mobility: Supine to Sit Supine to sit: Mod assist Sit to supine: Max assist, +2 for physical assistance, +2 for safety/equipment General bed mobility comments: Received up in chair    Transfers  Overall transfer level: Needs assistance Equipment used: Rolling walker (2 wheels) Transfers: Sit to/from Stand Sit to Stand: Mod assist, +2 physical assistance, +2 safety/equipment Bed to/from chair/wheelchair/BSC transfer type:: Step pivot Step pivot transfers: Min assist, +2 safety/equipment General transfer comment: Stood from Chair x3 W/ +2 physical assist, cues for hand placement; Mod assist to control descent to sit back into chair. Decreased endurance overall    Ambulation / Gait / Stairs / Wheelchair Mobility  Ambulation/Gait Ambulation/Gait assistance: +2 safety/equipment, Min assist Gait Distance (Feet): 10  Feet Assistive device: Rolling walker (2 wheels) Gait Pattern/deviations: Step-through pattern, Decreased step length - right, Decreased step length - left, Trunk flexed General Gait Details: Cues for upright posture, and to self-monitor for activity tolerance; walked with RW to cabinet, and once there, able to let go of RW briefly with L UE to open cabinet    Posture / Balance Balance Overall balance assessment: Needs assistance Sitting-balance support: Feet supported, No upper extremity supported Sitting balance-Leahy Scale: Good Standing balance support: Bilateral upper extremity supported Standing balance-Leahy Scale: Poor    Special needs/care consideration N/a   Previous Home Environment (  from acute therapy documentation) Living Arrangements: Spouse/significant other Available Help at Discharge: Family, Personal care attendant, Available 24 hours/day Type of Home: House Home Layout: One level Home Access: Level entry Bathroom Shower/Tub: Walk-in shower Bathroom Toilet: Handicapped height Bathroom Accessibility: Yes Additional Comments: pt has 24/7 caregiver for himself and his wife that assists with all care  Discharge Living Setting Plans for Discharge Living Setting: Patient's home Type of Home at Discharge: House Discharge Home Layout: One level Discharge Home Access: Level entry Discharge Bathroom Shower/Tub: Walk-in shower Discharge Bathroom Toilet: Handicapped height Discharge Bathroom Accessibility: Yes How Accessible: Accessible via walker Does the patient have any problems obtaining your medications?: No  Social/Family/Support Systems Patient Roles: Spouse Contact Information: spouse has advanced dementia and has been moved into a care facility, per pt's children she will not return home, not sure how clear this is to patient yet Anticipated Caregiver: Jamie [phone]; Stephanie (POA) [phone] Anticipated Caregiver's Contact Information: see  above Ability/Limitations of Caregiver: n/a Caregiver Availability: 24/7 Discharge Plan Discussed with Primary Caregiver: Yes Is Caregiver In Agreement with Plan?: Yes Does Caregiver/Family have Issues with Lodging/Transportation while Pt is in Rehab?: No  Goals Patient/Family Goal for Rehab: PT/OT supervision, SLP n/a Expected length of stay: 6-9 days Additional Information: see above regarding pt's spouse Pt/Family Agrees to Admission and willing to participate: Yes Program Orientation Provided & Reviewed with Pt/Caregiver Including Roles  & Responsibilities: Yes  Decrease burden of Care through IP rehab admission: n/a  Possible need for SNF placement upon discharge: Not anticipated  Patient Condition: I have reviewed medical records from [place], spoken with CM, and patient and son. I met with patient at the bedside and discussed via phone for inpatient rehabilitation assessment.  Patient will benefit from ongoing PT and OT, can actively participate in 3 hours of therapy a day 5 days of the week, and can make measurable gains during the admission.  Patient will also benefit from the coordinated team approach during an Inpatient Acute Rehabilitation admission.  The patient will receive intensive therapy as well as Rehabilitation physician, nursing, social worker, and care management interventions.  Due to safety, medication administration, pain management, and patient education the patient requires 24 hour a day rehabilitation nursing.  The patient is currently min to mod +2 with mobility and basic ADLs.  Discharge setting and therapy post discharge at home with home health is anticipated.  Patient has agreed to participate in the Acute Inpatient Rehabilitation Program and will admit today.  Preadmission Screen Completed By:  Derrius Furtick E Guillaume Weninger, PT, DPT 01/17/2022 11:25 AM ______________________________________________________________________   Discussed status with Dr. Raulkar on 01/17/22   at 11:32 AM  and received approval for admission today.  Admission Coordinator:  Jaskiran Pata E Yarexi Pawlicki, PT, DPT time 11:32 AM /Date 01/17/22    Assessment/Plan: Diagnosis: Debility Does the need for close, 24 hr/day Medical supervision in concert with the patient's rehab needs make it unreasonable for this patient to be served in a less intensive setting? Yes Co-Morbidities requiring supervision/potential complications: large pleural effusion, overweight, fatigue, hemochromatosis, atrial fibrillation, HTN Due to bladder management, bowel management, safety, skin/wound care, disease management, medication administration, pain management, and patient education, does the patient require 24 hr/day rehab nursing? Yes Does the patient require coordinated care of a physician, rehab nurse, PT, OT, and SLP to address physical and functional deficits in the context of the above medical diagnosis(es)? Yes Addressing deficits in the following areas: balance, endurance, locomotion, strength, transferring, bowel/bladder control, bathing, dressing, feeding,   setting? Yes Co-Morbidities requiring supervision/potential complications: large pleural effusion, overweight, fatigue, hemochromatosis, atrial fibrillation, HTN Due to bladder management, bowel management, safety, skin/wound care, disease management, medication administration, pain management, and patient education, does the patient require 24 hr/day rehab nursing? Yes Does the patient require coordinated care of a physician, rehab nurse, PT, OT, and SLP to address physical and functional deficits in the context of the above medical diagnosis(es)? Yes Addressing deficits in the following areas: balance, endurance, locomotion, strength, transferring, bowel/bladder control, bathing, dressing, feeding, grooming, toileting, and psychosocial support Can the patient actively participate in an intensive therapy program of at least 3 hrs of therapy 5 days a week? Yes The potential for patient to make measurable gains while on inpatient rehab is excellent Anticipated functional outcomes upon discharge from inpatient rehab: supervision PT, supervision OT, supervision SLP Estimated rehab length of stay to reach the above functional goals is: 10-12 days Anticipated discharge destination: Home 10. Overall Rehab/Functional Prognosis: excellent     MD Signature: Leeroy Cha, MD

## 2022-01-19 ENCOUNTER — Inpatient Hospital Stay (HOSPITAL_COMMUNITY): Payer: Medicare Other

## 2022-01-19 DIAGNOSIS — D62 Acute posthemorrhagic anemia: Secondary | ICD-10-CM | POA: Diagnosis not present

## 2022-01-19 DIAGNOSIS — R5381 Other malaise: Secondary | ICD-10-CM | POA: Diagnosis not present

## 2022-01-19 DIAGNOSIS — R509 Fever, unspecified: Secondary | ICD-10-CM | POA: Diagnosis not present

## 2022-01-19 DIAGNOSIS — I4811 Longstanding persistent atrial fibrillation: Secondary | ICD-10-CM | POA: Diagnosis not present

## 2022-01-19 DIAGNOSIS — M542 Cervicalgia: Secondary | ICD-10-CM

## 2022-01-19 DIAGNOSIS — F329 Major depressive disorder, single episode, unspecified: Secondary | ICD-10-CM | POA: Diagnosis not present

## 2022-01-19 LAB — URINE CULTURE: Culture: NO GROWTH

## 2022-01-19 LAB — CBC WITH DIFFERENTIAL/PLATELET
Abs Immature Granulocytes: 0.06 10*3/uL (ref 0.00–0.07)
Basophils Absolute: 0 10*3/uL (ref 0.0–0.1)
Basophils Relative: 1 %
Eosinophils Absolute: 0.3 10*3/uL (ref 0.0–0.5)
Eosinophils Relative: 3 %
HCT: 32.4 % — ABNORMAL LOW (ref 39.0–52.0)
Hemoglobin: 11 g/dL — ABNORMAL LOW (ref 13.0–17.0)
Immature Granulocytes: 1 %
Lymphocytes Relative: 8 %
Lymphs Abs: 0.7 10*3/uL (ref 0.7–4.0)
MCH: 31.6 pg (ref 26.0–34.0)
MCHC: 34 g/dL (ref 30.0–36.0)
MCV: 93.1 fL (ref 80.0–100.0)
Monocytes Absolute: 0.9 10*3/uL (ref 0.1–1.0)
Monocytes Relative: 10 %
Neutro Abs: 6.8 10*3/uL (ref 1.7–7.7)
Neutrophils Relative %: 77 %
Platelets: 233 10*3/uL (ref 150–400)
RBC: 3.48 MIL/uL — ABNORMAL LOW (ref 4.22–5.81)
RDW: 14.5 % (ref 11.5–15.5)
WBC: 8.8 10*3/uL (ref 4.0–10.5)
nRBC: 0 % (ref 0.0–0.2)

## 2022-01-19 MED ORDER — METOPROLOL TARTRATE 25 MG PO TABS
25.0000 mg | ORAL_TABLET | Freq: Three times a day (TID) | ORAL | Status: DC
  Administered 2022-01-19 – 2022-01-20 (×2): 25 mg via ORAL
  Filled 2022-01-19 (×2): qty 1

## 2022-01-19 MED ORDER — ACETAMINOPHEN 325 MG PO TABS
650.0000 mg | ORAL_TABLET | Freq: Three times a day (TID) | ORAL | Status: DC
  Administered 2022-01-19 – 2022-01-26 (×27): 650 mg via ORAL
  Filled 2022-01-19 (×27): qty 2

## 2022-01-19 MED ORDER — QUETIAPINE FUMARATE 25 MG PO TABS
12.5000 mg | ORAL_TABLET | Freq: Every day | ORAL | Status: DC
  Administered 2022-01-19 – 2022-01-25 (×7): 12.5 mg via ORAL
  Filled 2022-01-19 (×8): qty 1

## 2022-01-19 MED ORDER — METOPROLOL TARTRATE 25 MG PO TABS
25.0000 mg | ORAL_TABLET | Freq: Once | ORAL | Status: AC
  Administered 2022-01-19: 25 mg via ORAL
  Filled 2022-01-19: qty 1

## 2022-01-19 MED ORDER — OXYCODONE HCL 5 MG PO TABS
5.0000 mg | ORAL_TABLET | ORAL | Status: DC | PRN
  Filled 2022-01-19: qty 2

## 2022-01-19 MED ORDER — APIXABAN 5 MG PO TABS
5.0000 mg | ORAL_TABLET | Freq: Two times a day (BID) | ORAL | Status: DC
  Administered 2022-01-20 – 2022-01-26 (×13): 5 mg via ORAL
  Filled 2022-01-19 (×13): qty 1

## 2022-01-19 MED ORDER — ACETAMINOPHEN 325 MG PO TABS: 650.0000 mg | ORAL_TABLET | Freq: Three times a day (TID) | ORAL | Status: DC

## 2022-01-19 MED ORDER — POLYETHYLENE GLYCOL 3350 17 G PO PACK: 17.0000 g | PACK | Freq: Every day | ORAL | 0 refills | Status: DC | PRN

## 2022-01-19 NOTE — Progress Notes (Signed)
EKG showed A fib with RVR. Cardiology contacted for recommendations.

## 2022-01-19 NOTE — Progress Notes (Signed)
Occupational Therapy Session Note  Patient Details  Name: Eugene [name] MRN: [id number] Date of Birth: Aug 11, 1935   Session 1 Today's Date: 01/19/2022 OT Individual Time: 0300-9233 OT Individual Time Calculation (min): 45 min    Session 2 Today's Date: 01/19/2022 OT Missed Time: 59 Minutes Missed Time Reason: MD hold (comment)  Short Term Goals: Week 1:  OT Short Term Goal 1 (Week 1): Patient will complete sit<>stand with mod A in preparation for BADL tasks OT Short Term Goal 2 (Week 1): Patient will tolerate standing at the sink for 1 minute during BADL task. OT Short Term Goal 3 (Week 1): Patient will complete toilet transfer with mod A.  Skilled Therapeutic Interventions/Progress Updates:    Session 1 Pt received supine with high c/o pain in his neck, reported as achey, all around his neck. He reports his neck pain was reported to the MD. He required frequent encouragement to participate in OT, stating "I am too weak" with OT providing education on recovery and need to be OOB. He also was found to be saturated in urine and unaware. He came to EOB with max A, yelling out in pain that his neck was hurting. Once EOB his pain stabilized. He required CGA-mod A to maintain static sitting balance. He required min A to wash UB and to don new shirt. He stood from EOB with max A to stand and then only mod A to maintain stand while OT performed max A peri hygiene and don new brief. He required max A to return to supine. He was agreeable to drink a honey thick cup of water. He was sat up to 50 degrees and given SLP instructed cues for small sips and multiple swallows. Pt left supine with all needs met.    Session 2 Pt on medical hold d/t ongoing HR and SpO2 fluctuations. 45 min missed. Will f/u when medically appropriate.    Therapy Documentation Precautions:  Precautions Precautions: Fall Precaution Comments: Rib fractures on L side Restrictions Weight Bearing Restrictions:  No  Therapy/Group: Individual Therapy  Curtis Sites 01/19/2022, 6:46 AM

## 2022-01-19 NOTE — Consult Note (Addendum)
Cardiology Consultation:   Patient ID: Eugene [name] MRN: [id number]; DOB: 28-May-1936  Admit date: 01/17/2022 Date of Consult: 01/19/2022  PCP:  [name], MD   [facility] Providers Cardiologist:  Armanda Magic, MD   Patient Profile:   Eugene [name] is a 86 y.o. male with a hx of permanent Afib on Eliquis s/p failed DCCV x 2, AAA s/p stent graft 2009, mild AS, GERD, HLD, HTN, and prior TIA who is being seen 01/19/2022 for the evaluation of Afib RVR at the request of dr. Natale Lay.  History of Present Illness:   Mr. [name] with the above past medical history is admitted to CIR following admission for large left hemothorax and hypervolemia after a fall.  He was treated with pigtail catheter with 2.5 L of bloody fluid drained.  His acute blood loss anemia was treated with 3 units PRBC.  Eliquis was discontinued and CT surgery consulted for input but felt there was no indication for VATS.  AKI improved.  Decision was made to hold Eliquis until he followed up with his PCP or cardiology.  He is rate controlled with 25 mg metoprolol twice daily.  He is also maintained on 20 mg Lipitor.   He was historically maintained on amlodipine, HCTZ, metoprolol, and ramipril.  Appears blood pressure medications have been held given anemia  During my interview, he has no cardiac complaints and does not report palpitations, dizziness, or near syncope.    Past Medical History:  Diagnosis Date   AAA (abdominal aortic aneurysm) (HCC) 06/29/2007   stent graft   Aortic stenosis 09/05/2017   Mild AS/AR by echo 01/2017.   GERD (gastroesophageal reflux disease)    Hemochromatosis    Hyperlipidemia    Hypertension    Irritable bowel syndrome    Permanent atrial fibrillation (HCC)    Stroke (HCC)    Transient Ischemic Attack    Past Surgical History:  Procedure Laterality Date   ABDOMINAL AORTIC ANEURYSM REPAIR     ENDOVASCULAR STENT INSERTION  07/18/2007   saccular infrarenal aortic  aneurysm   EYE SURGERY Bilateral    Cataract     IR FLUORO GUIDE CV LINE LEFT  12/08/2018   IR FLUORO GUIDE CV LINE RIGHT  02/13/2017   IR FLUORO GUIDE CV LINE RIGHT  02/25/2017   IR US GUIDE VASC ACCESS RIGHT  02/13/2017   PICC LINE PLACE PERIPHERAL ([facility] HX)  Oct 21, 2014     Home Medications:  Prior to Admission medications   Medication Sig Start Date End Date Taking? Authorizing Provider  acetaminophen (TYLENOL) 325 MG tablet Take 2 tablets (650 mg total) by mouth every 6 (six) hours as needed for mild pain (or Fever >/= 101). 08/28/21   Rodolph Bong, MD  ALPRAZolam Prudy Feeler) 1 MG tablet Take 0.5 tablets (0.5 mg total) by mouth 2 (two) times daily. For anxiety 04/02/21   Elgergawy, Leana Roe, MD  Ascorbic Acid (VITAMIN C PO) Take 1 tablet by mouth in the morning.    [provider]  atorvastatin (LIPITOR) 20 MG tablet Take 1 tablet (20 mg total) by mouth daily. 12/19/21 12/20/22  Sharlene Dory, PA-C  Cholecalciferol (VITAMIN D-3 PO) Take 1 capsule by mouth in the morning.    [provider]  fluticasone (FLONASE) 50 MCG/ACT nasal spray Place 2 sprays into both nostrils daily as needed for allergies or rhinitis.    [provider]  furosemide (LASIX) 40 MG tablet Take 1 tablet (40 mg total) by  mouth daily. 12/19/21 12/19/22  Sharlene Doryonte, Tessa N, PA-C  levETIRAcetam (KEPPRA) 500 MG tablet Take 1 tablet (500 mg total) by mouth 2 (two) times daily. 08/28/21   Rodolph Bonghompson, Daniel V, MD  loperamide (IMODIUM A-D) 2 MG tablet Take 2-4 mg by mouth 4 (four) times daily as needed for diarrhea or loose stools.    [provider]  metoprolol tartrate (LOPRESSOR) 25 MG tablet Take 1 tablet (25 mg total) by mouth 2 (two) times daily. Patient taking differently: Take 50 mg by mouth 2 (two) times daily. 12/19/21 12/20/22  Sharlene Doryonte, Tessa N, PA-C  omeprazole (PRILOSEC OTC) 20 MG tablet Take 1 tablet (20 mg total) by mouth daily. 01/16/22 01/17/23  Rhetta MuraSamtani, Jai-Gurmukh, MD  oxyCODONE (OXY  IR/ROXICODONE) 5 MG immediate release tablet Take 1 tablet (5 mg total) by mouth every 4 (four) hours as needed for moderate pain. 01/16/22   Rhetta MuraSamtani, Jai-Gurmukh, MD  oxymetazoline (AFRIN) 0.05 % nasal spray Place 1 spray into both nostrils 2 (two) times daily as needed for congestion.    [provider]  QUEtiapine (SEROQUEL) 25 MG tablet Take 12.5 mg by mouth at bedtime. 07/13/21   [provider]  tamsulosin (FLOMAX) 0.4 MG CAPS capsule Take 0.4 mg by mouth at bedtime. 06/14/21   [provider]  Thiamine HCl (VITAMIN B-1 PO) Take 1 tablet by mouth in the morning.    [provider]  VITAMIN E PO Take 1 capsule by mouth in the morning.    [provider]  ZINC OXIDE EX Apply 1 application  topically See admin instructions. Apply topically diaper area every time pt uses bathroom as a skin barrier    [provider]    Inpatient Medications: Scheduled Meds:  acetaminophen  650 mg Oral TID WC & HS   ALPRAZolam  0.5 mg Oral BID   atorvastatin  20 mg Oral Daily   docusate sodium  100 mg Oral BID   feeding supplement  1 Container Oral TID BM   ferrous sulfate  325 mg Oral Q breakfast   levETIRAcetam  500 mg Oral BID   metoprolol tartrate  25 mg Oral TID   metoprolol tartrate  25 mg Oral Once   multivitamin with minerals  1 tablet Oral Daily   pantoprazole  40 mg Oral Daily   QUEtiapine  12.5 mg Oral QHS   tamsulosin  0.4 mg Oral QHS   Continuous Infusions:  PRN Meds: albuterol, alum & mag hydroxide-simeth, bisacodyl, diclofenac Sodium, diphenhydrAMINE, guaiFENesin-dextromethorphan, polyethylene glycol, prochlorperazine **OR** prochlorperazine **OR** prochlorperazine, sodium phosphate  Allergies:    Allergies  Allergen Reactions   Chlorhexidine Gluconate Itching    Don't use Chloraprep inside port a cath kits.   Macrolides And Ketolides Nausea And Vomiting   Tape Other (See Comments)    Adhesive tape,   Only use paper tape     Social History:   Social History   Socioeconomic History   Marital status: Married    Spouse name: Not on file   Number of children: Not on file   Years of education: Not on file   Highest education level: Not on file  Occupational History   Occupation: retired  Tobacco Use   Smoking status: Former    Packs/day: 1.00    Years: 40.00    Total pack years: 40.00    Types: Cigarettes    Quit date: 06/18/1999    Years since quitting: 22.6   Smokeless tobacco: Never  Vaping Use  Vaping Use: Never used  Substance and Sexual Activity   Alcohol use: Yes    Alcohol/week: 1.0 - 2.0 standard drink of alcohol    Types: 1 - 2 Standard drinks or equivalent per week   Drug use: No   Sexual activity: Not Currently  Other Topics Concern   Not on file  Social History Narrative   Not on file   Social Determinants of Health   Financial Resource Strain: Not on file  Food Insecurity: Not on file  Transportation Needs: Not on file  Physical Activity: Not on file  Stress: Not on file  Social Connections: Not on file  Intimate Partner Violence: Not on file    Family History:    Family History  Problem Relation Age of Onset   Hypertension Mother    Heart disease Mother        AAA, he passed in his 32 s of Heart Diseasse   Heart disease Father        AAA history and he passed at age 73  of   Disease     ROS:  Please see the history of present illness.   All other ROS reviewed and negative.     Physical Exam/Data:   Vitals:   01/18/22 1947 01/19/22 0500 01/19/22 0501 01/19/22 1309  BP: 126/77  (!) 135/92 123/80  Pulse: (!) 110  86 (!) 104  Resp: 20  16 18   Temp: 98.5 F (36.9 C)  98.1 F (36.7 C)   TempSrc: Oral     SpO2: 94%  98% (!) 89%  Weight:  83.5 kg    Height:        Intake/Output Summary (Last 24 hours) at 01/19/2022 1623 Last data filed at 01/19/2022 1251 Gross per 24 hour  Intake 600 ml  Output 200 ml  Net 400 ml      01/19/2022    5:00 AM 01/18/2022     5:00 AM 01/17/2022    6:50 PM  Last 3 Weights  Weight (lbs) 184 lb 181 lb 12.8 oz 188 lb 6.4 oz  Weight (kg) 83.462 kg 82.464 kg 85.458 kg     Body mass index is 24.95 kg/m.  General:  elderly male in NAD, found sleeping HEENT: normal Neck: no JVD Vascular: No carotid bruits; Distal pulses 2+ bilaterally Cardiac:  irregular rhythm, tachycardic rate Lungs:  clear to auscultation bilaterally, no wheezing, rhonchi or rales  Abd: soft, nontender, no hepatomegaly  Ext: no edema Musculoskeletal:  No deformities, BUE and BLE strength normal and equal Skin: warm and dry  Neuro:  CNs 2-12 intact, no focal abnormalities noted Psych:  Normal affect   EKG:  The EKG was personally reviewed and demonstrates:  atrial fibrillation with VR 115 Telemetry:  Telemetry was personally reviewed and demonstrates:  N/A  Relevant CV Studies:  Echo 01/12/22: 1. Left ventricular ejection fraction, by estimation, is 60 to 65%. The  left ventricle has normal function.   2. Right ventricular systolic function is normal. The right ventricular  size is normal.   3. No evidence of mitral valve regurgitation.   4. The aortic valve is calcified.  Laboratory Data:  High Sensitivity Troponin:   Recent Labs  Lab 01/11/22 1130 01/11/22 1348  TROPONINIHS 148* 184*     Chemistry Recent Labs  Lab 01/16/22 0146 01/17/22 0148 01/18/22 0529  NA 136 135 135  K 3.8 3.9 3.6  CL 100 101 101  CO2 27 26 25   GLUCOSE 96 97  99  BUN 20 25* 21  CREATININE 1.30* 1.37* 1.35*  CALCIUM 8.5* 8.5* 8.4*  MG 2.0 2.1 2.0  GFRNONAA 54* 51* 52*  ANIONGAP 9 8 9     Recent Labs  Lab 01/18/22 0529  PROT 5.4*  ALBUMIN 2.4*  AST 19  ALT 18  ALKPHOS 50  BILITOT 1.4*   Lipids No results for input(s): "CHOL", "TRIG", "HDL", "LABVLDL", "LDLCALC", "CHOLHDL" in the last 168 hours.  Hematology Recent Labs  Lab 01/17/22 2035 01/18/22 0529 01/19/22 0621  WBC 10.3 9.0 8.8  RBC 3.52* 3.30* 3.48*  HGB 11.3* 10.5* 11.0*   HCT 32.9* 30.6* 32.4*  MCV 93.5 92.7 93.1  MCH 32.1 31.8 31.6  MCHC 34.3 34.3 34.0  RDW 14.7 14.5 14.5  PLT 249 214 233   Thyroid No results for input(s): "TSH", "FREET4" in the last 168 hours.  BNPNo results for input(s): "BNP", "PROBNP" in the last 168 hours.  DDimer No results for input(s): "DDIMER" in the last 168 hours.   Radiology/Studies:  DG Chest 2 View  Result Date: 01/17/2022 CLINICAL DATA:  Fever. EXAM: CHEST - 2 VIEW COMPARISON:  Radiograph yesterday. FINDINGS: Retrocardiac opacity likely represents a combination of pleural effusion and airspace disease/atelectasis. Tiny left apical pneumothorax is stable. Mild right infrahilar atelectasis, similar. Stable heart size and mediastinal contours. Aortic atherosclerosis. IMPRESSION: 1. Unchanged tiny left apical pneumothorax. 2. Retrocardiac opacity likely combination of pleural effusion and airspace disease/atelectasis. Electronically Signed   By: 01/19/2022 M.D.   On: 01/17/2022 22:14   DG CHEST PORT 1 VIEW  Result Date: 01/16/2022 CLINICAL DATA:  Pneumothorax. EXAM: PORTABLE CHEST 1 VIEW COMPARISON:  Chest 01/15/2022 FINDINGS: Left chest tube removed. Tiny left apical pneumothorax. Left lower lobe airspace disease and small left effusion unchanged. Mild right lower lobe atelectasis unchanged. Negative for heart failure. Extensive atherosclerotic calcification IMPRESSION: Tiny left apical pneumothorax following left chest tube removal. Left lower lobe airspace disease and left effusion unchanged Electronically Signed   By: 01/17/2022 M.D.   On: 01/16/2022 09:01     Assessment and Plan:   Atrial fibrillation with RVR Patient is not on telemetry Heart rate on exam is 100 to 110 bpm Patient is asymptomatic We will increase metoprolol to 25 mg 3 times daily May titrate if more rate control is needed -consider 50 mg twice daily Patient does not report increased pain and is found sleeping with his heart rate   Need  for chronic anticoagulation Recent hemothorax following a fall Would consider restarting Eliquis while in CIR so he could be monitored Consider starting 5 mg Eliquis twice daily tomorrow morning   Hypertension Agree with holding antihypertensives for rate controlling agents    Risk Assessment/Risk Scores:     CHA2DS2-VASc Score = 6   This indicates a 9.7% annual risk of stroke. The patient's score is based upon: CHF History: 0 HTN History: 1 Diabetes History: 0 Stroke History: 2 Vascular Disease History: 1 Age Score: 2 Gender Score: 0  For questions or updates, please contact [facility] Please consult [email or web address] for contact info under    Signed, 01/18/2022, PA  01/19/2022 4:23 PM  Agree with note by 01/21/2022 PA-C  Asked to see this elderly Caucasian male with recent trauma, hemothorax requiring drainage with persistent A-fib on oral anticoagulation.  The reason for the consult was increased heart rate.  He currently is not on oral anticoagulant.  He is lying in bed comfortably.  Vital signs are stable.  His heart rate is approximate 110.  His exam is benign.  We will start him on metoprolol 25 mg p.o. 3 times daily and uptitrate depending on rate control.  Can probably start back on his oral anticoagulant and observe while in CIR.   Runell Gess, M.D., FACP, [facility], [facility] [address] [place], [place]  [address]  [phone] 01/19/2022 4:25 PM

## 2022-01-19 NOTE — Progress Notes (Signed)
Physical Therapy Session Note  Patient Details  Name: Eugene [name] MRN: [id number] Date of Birth: 11/17/1935  Today's Date: 01/19/2022 PT Individual Time: 1300-1330 PT Individual Time Calculation (min): 30 min   Short Term Goals: Week 1:  PT Short Term Goal 1 (Week 1): Patient will complete bed mobility with moderate assistance PT Short Term Goal 2 (Week 1): Patient will perform sit/stand with RW and moderate assistance PT Short Term Goal 3 (Week 1): Patient will perform stand pivot transfer with RW and moderate assistance  Skilled Therapeutic Interventions/Progress Updates:  Patient greeted semi-reclined in the bed with RN and NT present monitoring vitals- PA called in secondary to increased HR and decreased SpO2 reading and labored breathing (110 and 88%). PA cleared patient for therapy in the room with goals of improving positioning in order to improve overall breathing. Discussed with PA about ordering a K-pad to improve neck ROM and decrease pain.   Patient notably slumped and leaning to the left in the bed without any pants on- Patient agreeable to putting pants on and improving his positioning in the bed. Patient required total assist for threading pants through B LE and then guiding assistance for reaching for the waistband with R UE to pull them further up. Patient was able to initiate placing B LE off the bed, however physical assistance required for placing them completely of the bed with mod/max assist for righting trunk secondary to global weakness, rigidity and reports of neck/head pain. Once sitting EOB, therapist educated patient on improved posturing, however patient unable to sustain. Education also provided for pursed lip breathing with demonstration to improve patient understanding, however poor carryover. MinA for static sitting balance with B feet supported on the floor. Verbal/tactile cues for retracting chin and neck extension, however patient unable to tolerate.  Attempted standing at EOB with RW in order to pull-up pants, however patient unable to perform enough of an anterior weight shift due to neck pain. Therapist attempting to facilitate anterior lean, however pain resisting movement. Patient required total assistance for transitioning from sitting EOB to supine. While supine, patient attempted to roll left however unable reporting "I'm frozen." Patient required total assistance for rolling left and for managing pants. Total assist also required for scooting toward HOB. Patient positioned properly in bed with B heels elevated and only one pillow placed behind patient's head in order to promote neutral positioning.    Patient left supine in bed with call bell within reach, all needs met and person present to perform EKG.     Unable to obtain full 45 minutes secondary to stat EKG- Missed 15 minutes of treatment session.     Therapy Documentation Precautions:  Precautions Precautions: Fall Precaution Comments: Rib fractures on L side Restrictions Weight Bearing Restrictions: No   Therapy/Group: Individual Therapy     01/19/2022, 7:47 AM  

## 2022-01-19 NOTE — Progress Notes (Addendum)
Patient sleepy but arousable. Has neck pain that is MS in nature especially at left traps. Reported to be hypoxic but saturations dropped to mid 80's and rebounded to low 90's once aroused. Heart rate in 110 apically and EKG ordered. To work on pulmonary hygiene and use oxygen prn SOB/DOE. Will recheck CXR to rule out atelectatic PNA. Will d/c oxycodone due to sedation and decrease Seroquel to 12.5 mg/HS. Continue Xanax bid (as is less than chronic home dose at this time)

## 2022-01-19 NOTE — Progress Notes (Signed)
PROGRESS NOTE   Subjective/Complaints: Pt in bed his AM. Reports neck pain and soreness that started yesterday. No shooting or radiating pain.  Has remained afebrile.   Review of Systems  Constitutional:  Negative for chills, fever and weight loss.  Eyes:  Negative for double vision.  Respiratory:  Negative for cough.   Cardiovascular:  Negative for chest pain and palpitations.  Gastrointestinal: Negative.   Genitourinary: Negative.   Neurological:  Positive for weakness.    Objective:   DG Chest 2 View  Result Date: 01/17/2022 CLINICAL DATA:  Fever. EXAM: CHEST - 2 VIEW COMPARISON:  Radiograph yesterday. FINDINGS: Retrocardiac opacity likely represents a combination of pleural effusion and airspace disease/atelectasis. Tiny left apical pneumothorax is stable. Mild right infrahilar atelectasis, similar. Stable heart size and mediastinal contours. Aortic atherosclerosis. IMPRESSION: 1. Unchanged tiny left apical pneumothorax. 2. Retrocardiac opacity likely combination of pleural effusion and airspace disease/atelectasis. Electronically Signed   By: Keith Rake M.D.   On: 01/17/2022 22:14    Recent Labs    01/18/22 0529 01/19/22 0621  WBC 9.0 8.8  HGB 10.5* 11.0*  HCT 30.6* 32.4*  PLT 214 233    Recent Labs    01/17/22 0148 01/18/22 0529  NA 135 135  K 3.9 3.6  CL 101 101  CO2 26 25  GLUCOSE 97 99  BUN 25* 21  CREATININE 1.37* 1.35*  CALCIUM 8.5* 8.4*     Intake/Output Summary (Last 24 hours) at 01/19/2022 0929 Last data filed at 01/19/2022 0844 Gross per 24 hour  Intake 600 ml  Output 300 ml  Net 300 ml      Pressure Injury 01/11/22 Coccyx Medial Deep Tissue Pressure Injury - Purple or maroon localized area of discolored intact skin or blood-filled blister due to damage of underlying soft tissue from pressure and/or shear. DTI sacrum (Active)  01/11/22 2235  Location: Coccyx  Location Orientation:  Medial  Staging: Deep Tissue Pressure Injury - Purple or maroon localized area of discolored intact skin or blood-filled blister due to damage of underlying soft tissue from pressure and/or shear.  Wound Description (Comments): DTI sacrum  Present on Admission: Yes    Physical Exam: Vital Signs Blood pressure (!) 135/92, pulse 86, temperature 98.1 F (36.7 C), resp. rate 16, height 6' (1.829 m), weight 83.5 kg, SpO2 98 %.    Assessment/Plan: 1. Functional deficits which require 3+ hours per day of interdisciplinary therapy in a comprehensive inpatient rehab setting. Physiatrist is providing close team supervision and 24 hour management of active medical problems listed below. Physiatrist and rehab team continue to assess barriers to discharge/monitor patient progress toward functional and medical goals  Care Tool:  Bathing    Body parts bathed by patient: Right arm, Left arm, Chest, Abdomen   Body parts bathed by helper: Front perineal area, Buttocks, Right upper leg, Left upper leg, Right lower leg, Left lower leg, Face     Bathing assist Assist Level: Maximal Assistance - Patient 24 - 49%     Upper Body Dressing/Undressing Upper body dressing   What is the patient wearing?: Pull over shirt    Upper body assist Assist Level: Moderate Assistance - Patient  50 - 74%    Lower Body Dressing/Undressing Lower body dressing      What is the patient wearing?: Pants, Incontinence brief     Lower body assist Assist for lower body dressing: Total Assistance - Patient < 25%     Toileting Toileting Toileting Activity did not occur Press photographer and hygiene only): N/A (no void or bm)  Toileting assist Assist for toileting: 2 Helpers     Transfers Chair/bed transfer  Transfers assist  Chair/bed transfer activity did not occur: Safety/medical concerns  Chair/bed transfer assist level: Total Assistance - Patient < 25%     Locomotion Ambulation   Ambulation  assist   Ambulation activity did not occur: Safety/medical concerns (Unable to perform ambulation and stair mobility secondary to poor endurance/activity tolerance and global weakness.)          Walk 10 feet activity   Assist  Walk 10 feet activity did not occur: Safety/medical concerns        Walk 50 feet activity   Assist Walk 50 feet with 2 turns activity did not occur: Safety/medical concerns         Walk 150 feet activity   Assist Walk 150 feet activity did not occur: Safety/medical concerns         Walk 10 feet on uneven surface  activity   Assist Walk 10 feet on uneven surfaces activity did not occur: Safety/medical concerns         Wheelchair     Assist Is the patient using a wheelchair?: Yes Type of Wheelchair: Manual    Wheelchair assist level: Total Assistance - Patient < 25% Max wheelchair distance: Patient able to propel ~20' and then required total assistance for the remaining 130'    Wheelchair 50 feet with 2 turns activity    Assist        Assist Level: Maximal Assistance - Patient 25 - 49%   Wheelchair 150 feet activity     Assist      Assist Level: Total Assistance - Patient < 25%   Blood pressure (!) 135/92, pulse 86, temperature 98.1 F (36.7 C), resp. rate 16, height 6' (1.829 m), weight 83.5 kg, SpO2 98 %.   Gen: no distress, normal appearing, fatiged HEENT: oral mucosa pink and moist, NCAT Cardio: RRR Chest: normal effort, normal rate of breathing Abd: soft, non-distended Ext: no edema Psych: pleasant, normal affect, decreased motivation Skin: intact Neurological:     Mental Status: He is alert and oriented to person, place, and time. Diffuse weakness, left shoulder limited by pain MSK: cervical paraspinal tenderness and tenderness in b/l traps    Medical Problem List and Plan: 1. Functional deficits secondary to pulmonary debility             -patient may shower             -ELOS/Goals: 10-14  days             -Continue CIR  -Will consult SLP due to coughing with drinking water for swallow eval 2.  Antithrombotics: -DVT/anticoagulation:  Mechanical: Sequential compression devices, below knee Bilateral lower extremities             -antiplatelet therapy: N/A 3. L shoulder pain: add voltaren gel prn. Oxycodone prn.  4. Mood/Behavior/Sleep: LCSW to follow for evaluation and support.              -antipsychotic agents: N/A 5. Neuropsych/cognition: This patient is capable of making decisions on his own behalf.  6. Skin/Wound Care: Routine pressure relief measures.              --foam dressing to DTI w/chronic tissue damage.              --continue nutritional supplements.  7. Fluids/Electrolytes/Nutrition: Monitor I/O. Check CMET in am             --continue nutritional supplements. 8. Left hemothorax: has resolved. Pulmonary hygiene.             --follow up with Pulmonary in 5 weeks w/CxR. 9. A fib: Monitor HR TID--continue Metoprolol BID  -appears to be mildly tachycardic chronically, continue to monitor, consider increase dose metoprolol 10. ABLA: Stable overall. Recheck CBC in am.             --h/o hemochromatosis.   -HGB stable 11 on 8/25, continue to monitor 11. H/o depression w/anxiety: Continue Xanax 0.5 mg bid w/ Seroquel 25 mg/HS. 12. H/o Seizure d/o: Continue Keppra bid.  13. Acute on chronic renal failure: Baseline SCr 1.27-1.3. Lasix on hold.              --has improved form 39/3.05-->25/1.37  -scr stable at 1.35 on 8/24             --encourage fluid intake. 14. Airspace disease on CXR: incentive spirometer ordered. 15. Pleural effusion: daily weights ordered to monitor for fluid overload while off of Lasix.  16. Fever of 100.8: ordered BC, UC, UA, procalcitonin, CBC, and CXR.  -WBC 9.0, procalcitonin neg, U/A pending, CXR appears similar to prior, UA ordered, no growth on blood cx, U/A-neg   -8/25 afebrile last night, WBC stable at 8.8 17. Fatigue: ordered iron,  B12, and folate with morning labs 18. Neck pain, suspect from increased use with therapy  -Increase oxycodone dose to 5-10mg  PRN      LOS: 2 days A FACE TO FACE EVALUATION WAS PERFORMED  Fanny Dance 01/19/2022, 9:29 AM

## 2022-01-19 NOTE — Consult Note (Signed)
Neuropsychological Consultation   Patient:   Eugene [name]   DOB:   25-Oct-1935  MR Number:  [id number]  Location:  [facility] [address] CENTER [id number] [place] [address] Dept: [phone] Loc: [phone]           Date of Service:   01/19/2022  Start Time:   10 AM End Time:   11 AM  Provider/Observer:  Arley Phenix, Psy.D.       Clinical Neuropsychologist       Billing Code/Service: [phone]  Chief Complaint:    Eugene [name] is an 86 year old male with past history of A-fib on Eliquis, IBS, hypertension, hereditary hemochromatosis who was admitted on 01/11/2022 with 2 to 3-week history of progressive shortness of breath as well as report of fall onto side of bathtub.  Patient found to have a large left hemothorax, volume overload, ABLA and elevated troponin levels.  Patient did refuse BiPAP.  Patient with a couple of broken ribs as well.  Today, patient reports that he has significant claustrophobia and has refused any type of mask.  Patient had also refused to do a sleep study in the recent past due to family's concerns of obstructive sleep apnea for same reason as refusing BiPAP recently.  Patient also had recent ED presentation with seizure on 08/25/2021 and started on Keppra after neurology consultation.  Patient with described generalized tonic-clonic seizure lasting approximately 1 minute and was noted to be hypoxic temporarily in the 60s during ED review that required bagging but was then maintained on 2 L nasal cannula oxygen.  Patient with significant debility and recovering from large left hemothorax and referred for comprehensive inpatient rehabilitation program.  Reason for Service:  Patient was referred for neuropsychological consultation due to depressive type symptoms with the patient's wife recently being placed in memory care and the patient with fall and subsequent injuries with  broken ribs and left hemothorax with significant delay in seeking medical care.  Below is the HPI for the current admission.  HPI: Eugene [name] is an 86 year old male with history of A fib-on eliquis, IBS, HTN, hereditary hemochromatosis who was admitted on 01/11/22 with 1-2 weeks history of progressive SOB as well as reports of fall onto side of bathtub. He was found to have large left hemothorax, volume overload, ABLA and elevated troponin. He refused BIPAP. Pigtail catheter placed left chest with 2.5 L of bloody fluid and ABLA treated with 3 units PRBC secondary to low BP/hypoxia.   Elquis d/c and CT surgery consulted for input and felt that there was no indication for VATS. AKI due to ATN is improving and recommendations are to hold Eliquis till follo wup with PCP/cardiology on outpatient basis to determine risks v/s benefits.  Sleep study recommended due to family concerns of OSA but he has declined study due to claustrophobia. Bloody drainage has resolved and CT removed on 08/21. DTI/Chronic tissue damage from incontinence/fall treated with silicon foam, male purewick  and LALM. Feels very fatigued.   Current Status:  Patient was awake and alert most of the time during her visit today.  He was oriented x4.  However, his arousal levels fluctuated between awake and alert to falling off to sleep rather quickly.  Multiple factors are probably triggering varying arousal levels including his long-term coping with significant pain due to rib fractures and respiratory system injury, medications for pain.  When patient did wake up he quickly  regained focus and was wide-eyed and alert.  However, every couple of minutes he would become very somnolent even when he was in direct communication with me.  Patient acknowledged difficulty coping with his wife going into memory care and changes in his living situation.  Behavioral Observation: Eugene [name]  presents as a 86 y.o.-year-old Right handed Caucasian  Male who appeared his stated age. his dress was Appropriate and he was Well Groomed and his manners were Appropriate to the situation.  his participation was indicative of Attentive and Drowsy behaviors.  There were physical disabilities noted.  he displayed an appropriate level of cooperation and motivation.     Interactions:    Active when aroused but alternating levels with sudden onset of Drowsy state.  Attention:   abnormal and attention span appeared shorter than expected for age  Memory:   within normal limits; recent and remote memory intact  Visuo-spatial:  not examined  Speech (Volume):  low  Speech:   normal; normal  Thought Process:  Coherent and Relevant  Though Content:  WNL; not suicidal and not homicidal  Orientation:   person, place, time/date, and situation  Judgment:   Fair  Planning:   Fair  Affect:    Flat  Mood:    Dysphoric  Insight:   Fair  Intelligence:   high  Medical History:   Past Medical History:  Diagnosis Date   AAA (abdominal aortic aneurysm) (HCC) 06/29/2007   stent graft   Aortic stenosis 09/05/2017   Mild AS/AR by echo 01/2017.   GERD (gastroesophageal reflux disease)    Hemochromatosis    Hyperlipidemia    Hypertension    Irritable bowel syndrome    Permanent atrial fibrillation (HCC)    Stroke [facility]    Transient Ischemic Attack         Patient Active Problem List   Diagnosis Date Noted   Depressive reaction    Debility 01/17/2022   Malnutrition of moderate degree 01/12/2022   Large pleural effusion 01/11/2022   Acute kidney injury superimposed on chronic kidney disease (HCC) 01/11/2022   Dyslipidemia 01/11/2022   Mood disorder (HCC) 01/11/2022   DNR (do not resuscitate) 01/11/2022   Hemothorax on left 01/11/2022   Shoulder pain 08/29/2021   UTI (urinary tract infection) 08/26/2021   V-tach (HCC)    Seizure (HCC) 08/25/2021   Cardiomegaly NSVT 08/25/2021   BPH (benign prostatic hyperplasia) 08/25/2021   NSVT  (nonsustained ventricular tachycardia) (HCC) 08/25/2021   Renal insufficiency 08/25/2021   Anxiety 08/25/2021   Chronic anticoagulation 08/25/2021   Acute encephalopathy 08/25/2021   History of CVA (cerebrovascular accident) 08/25/2021   Hypoxia 08/25/2021   Hyponatremia 03/24/2021   Greater trochanter fracture (HCC) 03/24/2021   Hypokalemia 03/24/2021   Hypomagnesemia 03/24/2021   Hypochloremia 03/24/2021   Aortic stenosis 09/05/2017   PICC (peripherally inserted central catheter) flush 02/19/2017   Benign essential HTN 02/10/2014   Permanent atrial fibrillation (HCC) on chronic anticoagulation    GERD (gastroesophageal reflux disease)    Aftercare following surgery of the circulatory system, NEC 10/13/2012   Occlusion and stenosis of carotid artery without mention of cerebral infarction 09/03/2011   Aneurysm of abdominal vessel (HCC) 09/03/2011   Hemochromatosis 08/29/2011         Psychiatric History:  No significant prior psychiatric history although anxiety and depressive responses more recently with significant changes in living situation particular and his wife moving to memory care facility.  Family Med/Psych History:  Family History  Problem Relation  Age of Onset   Hypertension Mother    Heart disease Mother        AAA, he passed in his 14 s of Heart Diseasse   Heart disease Father        AAA history and he passed at age 59  of   Disease    Impression/DX:  Eugene [name] is an 86 year old male with past history of A-fib on Eliquis, IBS, hypertension, hereditary hemochromatosis who was admitted on 01/11/2022 with 2 to 3-week history of progressive shortness of breath as well as report of fall onto side of bathtub.  Patient found to have a large left hemothorax, volume overload, ABLA and elevated troponin levels.  Patient did refuse BiPAP.  Patient with a couple of broken ribs as well.  Today, patient reports that he has significant claustrophobia and has refused any type  of mask.  Patient had also refused to do a sleep study in the recent past due to family's concerns of obstructive sleep apnea for same reason as refusing BiPAP recently.  Patient also had recent ED presentation with seizure on 08/25/2021 and started on Keppra after neurology consultation.  Patient with described generalized tonic-clonic seizure lasting approximately 1 minute and was noted to be hypoxic temporarily in the 60s during ED review that required bagging but was then maintained on 2 L nasal cannula oxygen.  Patient with significant debility and recovering from large left hemothorax and referred for comprehensive inpatient rehabilitation program.  Patient was awake and alert most of the time during her visit today.  He was oriented x4.  However, his arousal levels fluctuated between awake and alert to falling off to sleep rather quickly.  Multiple factors are probably triggering varying arousal levels including his long-term coping with significant pain due to rib fractures and respiratory system injury, medications for pain.  When patient did wake up he quickly regained focus and was wide-eyed and alert.  However, every couple of minutes he would become very somnolent even when he was in direct communication with me.  Patient acknowledged difficulty coping with his wife going into memory care and changes in his living situation.  Disposition/Plan:  Patient appears to be adequately adjusting to the situation but issues related to his claustrophobia and fear of having a mass placed on his face, significant changes in psychosocial situation, acute pain are likely all present.  Patient potentially has other pulmonary issues and sleep disturbance possibly related to obstructive sleep apnea but patient has not been willing to address this for fear of what it would be like to have CPAP device.          Electronically Signed   _______________________ Ilean Skill, Psy.D. Clinical  Neuropsychologist

## 2022-01-19 NOTE — Evaluation (Signed)
Speech Language Pathology Assessment and Plan  Patient Details  Name: Eugene [name] MRN: [id number] Date of Birth: April 19, 1936  SLP Diagnosis: Dysphagia  Rehab Potential: Fair ELOS: ~2 weeks   Today's Date: 01/19/2022 SLP Individual Time: 0539-7673 SLP Individual Time Calculation (min): 73 min   Hospital Problem: Principal Problem:   Debility Active Problems:   Depressive reaction  Past Medical History:  Past Medical History:  Diagnosis Date   AAA (abdominal aortic aneurysm) ([place]) 06/29/2007   stent graft   Aortic stenosis 09/05/2017   Mild AS/AR by echo 01/2017.   GERD (gastroesophageal reflux disease)    Hemochromatosis    Hyperlipidemia    Hypertension    Irritable bowel syndrome    Permanent atrial fibrillation (HCC)    Stroke (HCC)    Transient Ischemic Attack   Past Surgical History:  Past Surgical History:  Procedure Laterality Date   ABDOMINAL AORTIC ANEURYSM REPAIR     ENDOVASCULAR STENT INSERTION  07/18/2007   saccular infrarenal aortic aneurysm   EYE SURGERY Bilateral    Cataract     IR FLUORO GUIDE CV LINE LEFT  12/08/2018   IR FLUORO GUIDE CV LINE RIGHT  02/13/2017   IR FLUORO GUIDE CV LINE RIGHT  02/25/2017   IR US GUIDE VASC ACCESS RIGHT  02/13/2017   PICC LINE PLACE PERIPHERAL ([place] [facility])  Oct 21, 2014    Assessment / Plan / Recommendation Clinical Impression Clinical Impression: Patient is a 86 y.o. year old male with recent admission to the hospital on 01/11/22 with 1-2 weeks history of progressive SOB as well as reports of fall onto side of bathtub. He was found to have large left hemothorax, volume overload, ABLA and elevated troponin. He refused BIPAP. Pigtail catheter placed left chest with 2.5 L of bloody fluid and ABLA treated with 3 units PRBC secondary to low BP/hypoxia.Patient transferred to CIR on 01/17/2022.  SLP consulted for CSE due to increased coughing with thin liquid intake and deconditioning. Pt received lethargic/fatigued and lying  semi-reclined in bed. C/o severe neck pain (9 out of 10); LPN and MD notified. In the interim, SLP provided distraction and repositioning. Upright positioning was limited during today's evaluation due to aforementioned pain, with pt only able to tolerate positioning up to 45 degrees.  Per clinical findings, pt presents with s/sx concerning for pharyngeal dysphagia with some findings c/f esophageal involvement (belching), particularly with thin liquid consumption. Oral motor examination marked by generalized weakness in the setting of deconditioning; however, no focal deficits noted. With Total A, pt consumed thin liquid via cup and straw, at which time pt exhibited consistent s/sx concerning for airway invasion to include immediate + weak throat clear and cough with each trial, multiple swallows, and belching. Immediate cough also noted when nectar-thick liquid was consumed via straw. No clinical s/sx concerning for airway invasion with PO trials of nectar-thick liquid via cup, honey-thick liquid via cup, and puree textures when slow rate was provided. Did not assess soft nor coarse solids textures this date due to fatigue and pain (pt unable to tolerate sitting above 45 degrees).  Given clinical findings and risk factors for development of aspiration PNA to include presence of s/sx concerning for aspiration with thin liquids, ongoing lethargy, deconditioning, decreased ambulation, and compromised positioning, recommend downgrading current diet to Dysphagia 1 with honey-thick liquids until instrumental assessment can be completed to further inform swallow mechanics and diet recommendations. Recommend medications are crushed with puree at this time. Only feed pt when upright  and alert, allow for slow rate and multiple swallow per bite/sip. Please monitor closely and make pt NPO if overt difficulty is observed with recommended textures. SLP intervention appears indicated at this time to insure safety with current  diet recommendations, perform instrumental swallow study, and advance diet as able. Anticipate diet advancement as endurance and strength improves.   Skilled Therapeutic Interventions          BSE administered, per MD order. Please see above for additional details.  SLP Assessment  Patient will need skilled Speech Lanaguage Pathology Services during CIR admission    Recommendations  SLP Diet Recommendations: Dysphagia 1 (Puree);Honey Liquid Administration via: Cup Medication Administration: Crushed with puree Supervision: Full supervision/cueing for compensatory strategies Compensations: Minimize environmental distractions;Slow rate;Small sips/bites Postural Changes and/or Swallow Maneuvers: Seated upright 90 degrees;Upright 30-60 min after meal Oral Care Recommendations: Oral care BID Recommendations for Other Services: Neuropsych consult Patient destination: Home Follow up Recommendations: Home Health SLP;24 hour supervision/assistance Equipment Recommended: To be determined    SLP Frequency 3 to 5 out of 7 days   SLP Duration  SLP Intensity  SLP Treatment/Interventions ~2 weeks  Minumum of 1-2 x/day, 30 to 90 minutes  Dysphagia/aspiration precaution training;Patient/family education    Pain Pain Assessment Pain Scale: 0-10 Pain Score: 9  Pain Location: Neck Interventions: LPN notified, repositioned, distraction  Prior Functioning Cognitive/Linguistic Baseline: Within functional limits Type of Home: House  Lives With: Spouse (Spouse has dementia) Available Help at Discharge: Family;Personal care attendant;Available 24 hours/day (daughter and son involved and able to help) Vocation: Retired  SLP Evaluation Oral Motor Oral Motor/Sensory Function Overall Oral Motor/Sensory Function: Generalized oral weakness Motor Speech Overall Motor Speech: Appears within functional limits for tasks assessed Motor Planning: Witnin functional limits  Care Tool Care Tool  Cognition Ability to hear (with hearing aid or hearing appliances if normally used Ability to hear (with hearing aid or hearing appliances if normally used): 2. Moderate difficulty - speaker has to increase volume and speak distinctly   Expression of Ideas and Wants Expression of Ideas and Wants: 3. Some difficulty - exhibits some difficulty with expressing needs and ideas (e.g, some words or finishing thoughts) or speech is not clear   Understanding Verbal and Non-Verbal Content Understanding Verbal and Non-Verbal Content: 4. Understands (complex and basic) - clear comprehension without cues or repetitions  Memory/Recall Ability Memory/Recall Ability : Current season;That he or she is in a hospital/hospital unit    Bedside Swallowing Assessment General Date of Onset: 01/17/22 Previous Swallow Assessment: 08/26/2021 - functional oropharyngeal deglutition - recs for regular diet and thin liquids Diet Prior to this Study: Regular;Thin liquids Temperature Spikes Noted: No Respiratory Status: Room air History of Recent Intubation: No Behavior/Cognition: Cooperative;Lethargic/Drowsy Oral Cavity - Dentition: Adequate natural dentition Self-Feeding Abilities: Able to feed self;Needs assist Patient Positioning: Upright in bed Baseline Vocal Quality: Normal Volitional Cough: Weak Volitional Swallow: Able to elicit  Ice Chips Ice chips: Not tested Thin Liquid Thin Liquid: Impaired Presentation: Cup;Straw Pharyngeal  Phase Impairments: Multiple swallows;Throat Clearing - Immediate;Cough - Immediate Nectar Thick Nectar Thick Liquid: Impaired Presentation: Cup;Straw Pharyngeal Phase Impairments: Throat Clearing - Immediate;Cough - Immediate;Multiple swallows Other Comments: S/sx concerning for airway invasion when nectar-thick liquid was administered via straw Honey Thick Honey Thick Liquid: Within functional limits Presentation: Cup Puree Puree: Within functional limits Presentation: Spoon  (SLP assisted due to lethargy) Solid Solid: Not tested (due to lethargy) BSE Assessment Suspected Esophageal Findings Suspected Esophageal Findings: Belching Risk for Aspiration Impact on safety  and function: Moderate aspiration risk;Risk for inadequate nutrition/hydration Other Related Risk Factors: Lethargy;Deconditioning  Short Term Goals: Week 1: SLP Short Term Goal 1 (Week 1): Pt will participate in MBSS to determine least restrictive diet with 100% completion. SLP Short Term Goal 2 (Week 1): Pt will consume least restrictive diet with minimal s/sx concerning for airway invasion with stable vitals and medical status given Sup A for implementation of aspiration precautions.  Refer to Care Plan for Long Term Goals  Recommendations for other services: Neuropsych  Discharge Criteria: Patient will be discharged from SLP if patient refuses treatment 3 consecutive times without medical reason, if treatment goals not met, if there is a change in medical status, if patient makes no progress towards goals or if patient is discharged from hospital.  The above assessment, treatment plan, treatment alternatives and goals were discussed and mutually agreed upon: by patient  Vinnie Langton 01/19/2022, 12:44 PM

## 2022-01-19 NOTE — Discharge Instructions (Addendum)
Inpatient Rehab Discharge Instructions  [facility] Discharge date and time:  09/01/232  Activities/Precautions/ Functional Status: Activity: no lifting, driving, or strenuous exercise till cleared by MD Diet:  Soft foods. Low salt diet.  Wound Care: keep wound clean and dry   Functional status:  ___ No restrictions     ___ Walk up steps independently _X__ 24/7 supervision/assistance   ___ Walk up steps with assistance ___ Intermittent supervision/assistance  ___ Bathe/dress independently ___ Walk with walker     _X__ Bathe/dress with assistance ___ Walk Independently    ___ Shower independently ___ Walk with assistance    ___ Shower with assistance _X__ No alcohol     ___ Return to work/school ________   Special Instructions: Need to use incentive spirometer 4-5 times a day for your lungs. Do not use any alcohol. Need someone with you when you are walking.  3. Do not take any medications that are not on this list. Note decrease in Xanax dose.       01/26/2022    9:35 AM 01/26/2022    7:00 AM 01/26/2022    5:00 AM  Vitals with BMI  Weight  183 lbs 180 lbs 12 oz  BMI  24.81 24.51  Systolic 126    Diastolic 82    Pulse 82      COMMUNITY REFERRALS UPON DISCHARGE:    Home Health:   PT  OT  RN                Agency: [facility]     Phone: [phone]   Medical Equipment/Items Ordered:3 IN 1                                                 Agency/Supplier:[facility]   [id number]    My questions have been answered and I understand these instructions. I will adhere to these goals and the provided educational materials after my discharge from the hospital.  Patient/Caregiver Signature _______________________________ Date __________  Clinician Signature _______________________________________ Date __________  Please bring this form and your medication list with you to all your follow-up doctor's appointments.

## 2022-01-20 DIAGNOSIS — F329 Major depressive disorder, single episode, unspecified: Secondary | ICD-10-CM

## 2022-01-20 DIAGNOSIS — I4891 Unspecified atrial fibrillation: Secondary | ICD-10-CM | POA: Diagnosis not present

## 2022-01-20 DIAGNOSIS — R509 Fever, unspecified: Secondary | ICD-10-CM

## 2022-01-20 DIAGNOSIS — M542 Cervicalgia: Secondary | ICD-10-CM

## 2022-01-20 DIAGNOSIS — I4819 Other persistent atrial fibrillation: Secondary | ICD-10-CM

## 2022-01-20 DIAGNOSIS — R5381 Other malaise: Secondary | ICD-10-CM | POA: Diagnosis not present

## 2022-01-20 DIAGNOSIS — E785 Hyperlipidemia, unspecified: Secondary | ICD-10-CM

## 2022-01-20 MED ORDER — METOPROLOL TARTRATE 25 MG PO TABS
25.0000 mg | ORAL_TABLET | Freq: Once | ORAL | Status: DC
Start: 2022-01-20 — End: 2022-01-20

## 2022-01-20 MED ORDER — METOPROLOL SUCCINATE ER 50 MG PO TB24
100.0000 mg | ORAL_TABLET | Freq: Every day | ORAL | Status: DC
  Administered 2022-01-21 – 2022-01-26 (×6): 100 mg via ORAL
  Filled 2022-01-20 (×6): qty 2

## 2022-01-20 MED ORDER — METOPROLOL SUCCINATE ER 50 MG PO TB24: 50.0000 mg | ORAL_TABLET | Freq: Every day | ORAL | Status: DC

## 2022-01-20 MED ORDER — METOPROLOL TARTRATE 50 MG PO TABS
50.0000 mg | ORAL_TABLET | Freq: Once | ORAL | Status: AC
  Administered 2022-01-20: 50 mg via ORAL
  Filled 2022-01-20: qty 1

## 2022-01-20 NOTE — Progress Notes (Addendum)
PROGRESS NOTE   Subjective/Complaints: He had with mild hypoxia yesterday, found to have Afib with RVR. Cardiology consulted. Pt in bed this AM. Reports he feels well overall and has no concerns or compaints.   Review of Systems  Constitutional:  Negative for chills, fever and weight loss.  Eyes:  Negative for double vision.  Respiratory:  Negative for cough.   Cardiovascular:  Negative for chest pain and palpitations.  Gastrointestinal: Negative.  Negative for abdominal pain.  Genitourinary: Negative.   Neurological:  Positive for weakness.    Objective:   DG Chest 2 View  Result Date: 01/19/2022 CLINICAL DATA:  Hypoxia EXAM: CHEST - 2 VIEW COMPARISON:  01/17/2022, chest CT 11/06/2005 FINDINGS: Small right and small moderate left pleural effusion. Airspace disease at left base. Cardiomegaly with vascular congestion. Aortic atherosclerosis. Tiny left apical pneumothorax noted previously is not well seen on this exam IMPRESSION: 1. Cardiomegaly with vascular congestion. Similar small moderate left effusion with dense airspace disease at the left base 2. Previously noted trace left apical pneumothorax is not well seen on this exam Electronically Signed   By: Jasmine Pang M.D.   On: 01/19/2022 18:49   Recent Labs    01/18/22 0529 01/19/22 0621  WBC 9.0 8.8  HGB 10.5* 11.0*  HCT 30.6* 32.4*  PLT 214 233    Recent Labs    01/18/22 0529  NA 135  K 3.6  CL 101  CO2 25  GLUCOSE 99  BUN 21  CREATININE 1.35*  CALCIUM 8.4*     Intake/Output Summary (Last 24 hours) at 01/20/2022 1324 Last data filed at 01/20/2022 0834 Gross per 24 hour  Intake 240 ml  Output 250 ml  Net -10 ml      Pressure Injury 01/11/22 Coccyx Medial Deep Tissue Pressure Injury - Purple or maroon localized area of discolored intact skin or blood-filled blister due to damage of underlying soft tissue from pressure and/or shear. DTI sacrum (Active)   01/11/22 2235  Location: Coccyx  Location Orientation: Medial  Staging: Deep Tissue Pressure Injury - Purple or maroon localized area of discolored intact skin or blood-filled blister due to damage of underlying soft tissue from pressure and/or shear.  Wound Description (Comments): DTI sacrum  Present on Admission: Yes    Physical Exam: Vital Signs Blood pressure 122/77, pulse (!) 106, temperature 98.3 F (36.8 C), temperature source Oral, resp. rate 16, height 6' (1.829 m), weight 83 kg, SpO2 93 %.    Assessment/Plan: 1. Functional deficits which require 3+ hours per day of interdisciplinary therapy in a comprehensive inpatient rehab setting. Physiatrist is providing close team supervision and 24 hour management of active medical problems listed below. Physiatrist and rehab team continue to assess barriers to discharge/monitor patient progress toward functional and medical goals  Care Tool:  Bathing    Body parts bathed by patient: Right arm, Left arm, Chest, Abdomen   Body parts bathed by helper: Front perineal area, Buttocks, Right upper leg, Left upper leg, Right lower leg, Left lower leg, Face     Bathing assist Assist Level: Maximal Assistance - Patient 24 - 49%     Upper Body Dressing/Undressing Upper body dressing  What is the patient wearing?: Pull over shirt    Upper body assist Assist Level: Moderate Assistance - Patient 50 - 74%    Lower Body Dressing/Undressing Lower body dressing      What is the patient wearing?: Pants, Incontinence brief     Lower body assist Assist for lower body dressing: Total Assistance - Patient < 25%     Toileting Toileting Toileting Activity did not occur Press photographer and hygiene only): N/A (no void or bm)  Toileting assist Assist for toileting: 2 Helpers     Transfers Chair/bed transfer  Transfers assist  Chair/bed transfer activity did not occur: Safety/medical concerns  Chair/bed transfer assist level:  Total Assistance - Patient < 25%     Locomotion Ambulation   Ambulation assist   Ambulation activity did not occur: Safety/medical concerns (Unable to perform ambulation and stair mobility secondary to poor endurance/activity tolerance and global weakness.)          Walk 10 feet activity   Assist  Walk 10 feet activity did not occur: Safety/medical concerns        Walk 50 feet activity   Assist Walk 50 feet with 2 turns activity did not occur: Safety/medical concerns         Walk 150 feet activity   Assist Walk 150 feet activity did not occur: Safety/medical concerns         Walk 10 feet on uneven surface  activity   Assist Walk 10 feet on uneven surfaces activity did not occur: Safety/medical concerns         Wheelchair     Assist Is the patient using a wheelchair?: Yes Type of Wheelchair: Manual    Wheelchair assist level: Total Assistance - Patient < 25% Max wheelchair distance: Patient able to propel ~20' and then required total assistance for the remaining 130'    Wheelchair 50 feet with 2 turns activity    Assist        Assist Level: Maximal Assistance - Patient 25 - 49%   Wheelchair 150 feet activity     Assist      Assist Level: Total Assistance - Patient < 25%   Blood pressure 122/77, pulse (!) 106, temperature 98.3 F (36.8 C), temperature source Oral, resp. rate 16, height 6' (1.829 m), weight 83 kg, SpO2 93 %.   Gen: no distress, normal appearing, fatiged HEENT: oral mucosa pink and moist, NCAT Cardio: tachy Chest: normal effort, normal rate of breathing Abd: soft, non-distended Ext: no edema Psych: normal affect, decreased motivation Skin: intact Neurological:     Mental Status: He is alert and oriented to person, place, and time. Diffuse weakness, left shoulder limited by pain MSK: cervical paraspinal tenderness and tenderness in b/l traps    Medical Problem List and Plan: 1. Functional deficits  secondary to pulmonary debility             -patient may shower             -ELOS/Goals: 10-14 days             -Continue CIR  -Will consult SLP due to coughing with drinking water for swallow eval 2.  Antithrombotics: -DVT/anticoagulation:  Mechanical: Sequential compression devices, below knee Bilateral lower extremities -Eliquis started by cardiology             -antiplatelet therapy: N/A 3. L shoulder pain: add voltaren gel prn. Oxycodone prn.  4. Mood/Behavior/Sleep: LCSW to follow for evaluation and support.              -  antipsychotic agents: N/A 5. Neuropsych/cognition: This patient is capable of making decisions on his own behalf. 6. Skin/Wound Care: Routine pressure relief measures.              --foam dressing to DTI w/chronic tissue damage.              --continue nutritional supplements.  7. Fluids/Electrolytes/Nutrition: Monitor I/O. Check CMET in am             --continue nutritional supplements. 8. Left hemothorax: has resolved. Pulmonary hygiene.             --follow up with Pulmonary in 5 weeks w/CxR. 9. A fib: Monitor HR TID--continue Metoprolol BID  -appears to be mildly tachycardic chronically, continue to monitor, consider increase dose metoprolol 10. ABLA: Stable overall. Recheck CBC in am.             --h/o hemochromatosis.   -HGB stable 11 on 8/25, continue to monitor 11. H/o depression w/anxiety: Continue Xanax 0.5 mg bid w/ Seroquel 25 mg/HS.  -8/26 Seroquel was decreased to 12.5mg /HS due to sedation 12. H/o Seizure d/o: Continue Keppra bid.  13. Acute on chronic renal failure: Baseline SCr 1.27-1.3. Lasix on hold.              --has improved form 39/3.05-->25/1.37  -scr stable at 1.35 on 8/24             --encourage fluid intake. 14. Airspace disease on CXR: incentive spirometer ordered. 15. Pleural effusion: daily weights ordered to monitor for fluid overload while off of Lasix.  16. Fever of 100.8: ordered BC, UC, UA, procalcitonin, CBC, and CXR.  -WBC  9.0, procalcitonin neg, U/A pending, CXR appears similar to prior, UA ordered, no growth on blood cx, U/A-neg   -8/25 afebrile last night, WBC stable at 8.8  8/26 CXR without acute changes 17. Fatigue: ordered iron, B12, and folate with morning labs 18. Neck pain, suspect from increased use with therapy  -Increase oxycodone dose to 5-10mg  PRN  -8/26 oxycodone was DC due to sedation, continue tylenol 19. Afib with RVR  -Cardiology started metoprolol XL 100mg  Daily(can reduce to 53ml XL if BP dont tolerate), Eliquis 5mg  started by cardiology, appreciate cardiology assistance      LOS: 3 days A FACE TO FACE EVALUATION WAS PERFORMED  45m 01/20/2022, 1:24 PM

## 2022-01-20 NOTE — Progress Notes (Signed)
Rounding Note    Patient Name: Eugene [name] Date of Encounter: 01/20/2022  [facility] Cardiologist: Armanda Magic, MD   Subjective   He is deconditioned. No palpitations  Inpatient Medications    Scheduled Meds:  acetaminophen  650 mg Oral TID WC & HS   ALPRAZolam  0.5 mg Oral BID   apixaban  5 mg Oral BID   atorvastatin  20 mg Oral Daily   docusate sodium  100 mg Oral BID   feeding supplement  1 Container Oral TID BM   ferrous sulfate  325 mg Oral Q breakfast   levETIRAcetam  500 mg Oral BID   metoprolol tartrate  25 mg Oral TID   multivitamin with minerals  1 tablet Oral Daily   pantoprazole  40 mg Oral Daily   QUEtiapine  12.5 mg Oral QHS   tamsulosin  0.4 mg Oral QHS   Continuous Infusions:  PRN Meds: albuterol, alum & mag hydroxide-simeth, bisacodyl, diclofenac Sodium, diphenhydrAMINE, guaiFENesin-dextromethorphan, polyethylene glycol, prochlorperazine **OR** prochlorperazine **OR** prochlorperazine, sodium phosphate   Vital Signs    Vitals:   01/19/22 1309 01/19/22 1949 01/20/22 0231 01/20/22 0536  BP: 123/80 105/61 122/77   Pulse: (!) 104 (!) 106 (!) 106   Resp: 18 18 16    Temp:  98.1 F (36.7 C) 98.3 F (36.8 C)   TempSrc:  Oral Oral   SpO2: (!) 89% 91% 93%   Weight:    83 kg  Height:        Intake/Output Summary (Last 24 hours) at 01/20/2022 1115 Last data filed at 01/20/2022 0834 Gross per 24 hour  Intake 480 ml  Output 250 ml  Net 230 ml      01/20/2022    5:36 AM 01/19/2022    5:00 AM 01/18/2022    5:00 AM  Last 3 Weights  Weight (lbs) 182 lb 15.7 oz 184 lb 181 lb 12.8 oz  Weight (kg) 83 kg 83.462 kg 82.464 kg      Telemetry    NA - Personally Reviewed  ECG    NA - Personally Reviewed  Physical Exam   Vitals:   01/19/22 1949 01/20/22 0231  BP: 105/61 122/77  Pulse:  (!) 106  Resp: 18 16  Temp: 98.1 F (36.7 C) 98.3 F (36.8 C)  SpO2:  93%    GEN: No acute distress.   Neck: No JVD Cardiac: RRR, no  murmurs, rubs, or gallops.  Respiratory: Clear to auscultation bilaterally. GI: Soft, nontender, non-distended  MS: No edema; No deformity. Neuro:  Nonfocal  Psych: Normal affect   Labs    High Sensitivity Troponin:   Recent Labs  Lab 01/11/22 1130 01/11/22 1348  TROPONINIHS 148* 184*     Chemistry Recent Labs  Lab 01/16/22 0146 01/17/22 0148 01/18/22 0529  NA 136 135 135  K 3.8 3.9 3.6  CL 100 101 101  CO2 27 26 25   GLUCOSE 96 97 99  BUN 20 25* 21  CREATININE 1.30* 1.37* 1.35*  CALCIUM 8.5* 8.5* 8.4*  MG 2.0 2.1 2.0  PROT  --   --  5.4*  ALBUMIN  --   --  2.4*  AST  --   --  19  ALT  --   --  18  ALKPHOS  --   --  50  BILITOT  --   --  1.4*  GFRNONAA 54* 51* 52*  ANIONGAP 9 8 9     Lipids No results for input(s): "CHOL", "  TRIG", "HDL", "LABVLDL", "LDLCALC", "CHOLHDL" in the last 168 hours.  Hematology Recent Labs  Lab 01/17/22 2035 01/18/22 0529 01/19/22 0621  WBC 10.3 9.0 8.8  RBC 3.52* 3.30* 3.48*  HGB 11.3* 10.5* 11.0*  HCT 32.9* 30.6* 32.4*  MCV 93.5 92.7 93.1  MCH 32.1 31.8 31.6  MCHC 34.3 34.3 34.0  RDW 14.7 14.5 14.5  PLT 249 214 233   Thyroid No results for input(s): "TSH", "FREET4" in the last 168 hours.  BNPNo results for input(s): "BNP", "PROBNP" in the last 168 hours.  DDimer No results for input(s): "DDIMER" in the last 168 hours.   Radiology    DG Chest 2 View  Result Date: 01/19/2022 CLINICAL DATA:  Hypoxia EXAM: CHEST - 2 VIEW COMPARISON:  01/17/2022, chest CT 11/06/2005 FINDINGS: Small right and small moderate left pleural effusion. Airspace disease at left base. Cardiomegaly with vascular congestion. Aortic atherosclerosis. Tiny left apical pneumothorax noted previously is not well seen on this exam IMPRESSION: 1. Cardiomegaly with vascular congestion. Similar small moderate left effusion with dense airspace disease at the left base 2. Previously noted trace left apical pneumothorax is not well seen on this exam Electronically Signed    By: Jasmine Pang M.D.   On: 01/19/2022 18:49    Cardiac Studies   TTE 01/12/2022   1. Left ventricular ejection fraction, by estimation, is 60 to 65%. The  left ventricle has normal function.   2. Right ventricular systolic function is normal. The right ventricular  size is normal.   3. No evidence of mitral valve regurgitation.   4. The aortic valve is calcified.   Patient Profile     Eugene [name] is a 86 y.o. male with a hx of permanent Afib on Eliquis s/p failed DCCV x 2, AAA s/p stent graft 2009, mild AS, GERD, HLD, HTN, and prior TIA who is being seen 01/19/2022 for the evaluation of Afib RVR at the request of dr. Natale Lay.  Mr. Goeken with the above past medical history is admitted to CIR following admission for large left hemothorax and hypervolemia after a fall.  He was treated with pigtail catheter with 2.5 L of bloody fluid drained.  His acute blood loss anemia was treated with 3 units PRBC.  Eliquis was discontinued and CT surgery consulted for input but felt there was no indication for VATS.  AKI improved.  Decision was made to hold Eliquis until he followed up with his PCP or cardiology; however it was restarted   He is rate controlled with 25 mg metoprolol twice daily.  He is also maintained on 20 mg Lipitor.   Assessment & Plan     Persistent Atrial Fibrillation: Rate controlled, low 100s. He is asymptomatic - start  metop XL 100 mg daily in the AM ( he takes tartrate 50 mg BID at home); if Bps don't tolerate, can reduce to 50 mg XL - will give another dose of tartrate today - continue eliquis 5 mg BID  HLD: lipitor 20 mg daily  With rates controlled and back on AC. Cardiology will sign off. He has an appointment with Dr. Mayford Knife  For questions or updates, please contact [facility] Please consult [email or web address] for contact info under        Signed, Maisie Fus, MD  01/20/2022, 11:15 AM

## 2022-01-20 NOTE — Progress Notes (Signed)
Physical Therapy Session Note  Patient Details  Name: Eugene [name] MRN: [id number] Date of Birth: [birth year]  Today's Date: 01/20/2022 PT Individual Time: 1535-1630 PT Individual Time Calculation (min): 55 min   Short Term Goals: Week 1:  PT Short Term Goal 1 (Week 1): Patient will complete bed mobility with moderate assistance PT Short Term Goal 2 (Week 1): Patient will perform sit/stand with RW and moderate assistance PT Short Term Goal 3 (Week 1): Patient will perform stand pivot transfer with RW and moderate assistance   Skilled Therapeutic Interventions/Progress Updates:   Pt received supine in bed and agreeable to PT. Supine>sit transfer with *** assist and ***cues.    Bed mobility.   Transfers.   Manual therapy for upper traps and cervical spine.   Kinetron.   Gait training.  Pt returned to room and performed ** transfer to bed with **. Sit>supine completed with ** and left supine in bed with call bell in reach and all needs met.         Therapy Documentation Precautions:  Precautions Precautions: Fall Precaution Comments: Rib fractures on L side Restrictions Weight Bearing Restrictions: No General:   Vital Signs: Therapy Vitals Temp: 97.8 F (36.6 C) Pulse Rate: (!) 101 Resp: 18 BP: 123/71 Patient Position (if appropriate): Lying Oxygen Therapy SpO2: 95 % O2 Device: Room Air Pain:   Mobility:   Locomotion :    Trunk/Postural Assessment :    Balance:   Exercises:   Other Treatments:      Therapy/Group: Individual Therapy  Lorie Phenix 01/20/2022, 4:44 PM

## 2022-01-20 NOTE — IPOC Note (Signed)
Overall Plan of Care [facility]) Patient Details Name: Eugene [name] MRN: [id number] DOB: 1936-03-21  Admitting Diagnosis: Debility  Hospital Problems: Principal Problem:   Debility Active Problems:   Atrial fibrillation (HCC)   Depressive reaction   Fever, unspecified   Neck pain     Functional Problem List: Nursing    PT Balance, Pain, Behavior, Safety, Endurance, Motor, Skin Integrity  OT Balance, Behavior, Endurance, Motor, Pain, Safety, Skin Integrity  SLP Nutrition, Endurance  TR         Basic ADL's: OT Eating, Grooming, Bathing, Dressing, Toileting     Advanced  ADL's: OT       Transfers: PT Bed Mobility, Bed to Chair, Customer service manager, Tub/Shower     Locomotion: PT Ambulation, Psychologist, prison and probation services, Stairs     Additional Impairments: OT None  SLP Swallowing      TR      Anticipated Outcomes Item Anticipated Outcome  Self Feeding Set-up  Swallowing  Sup A   Basic self-care  CGA/supervision  Toileting  CGA/supervision   Bathroom Transfers CGA  Bowel/Bladder     Transfers  MinA with RW  Locomotion  MinA with RW for short-distances; Supervision for wheelchair mobility up to 50'  Communication  N/A  Cognition  N/A  Pain     Safety/Judgment      Therapy Plan: PT Intensity: Minimum of 1-2 x/day ,45 to 90 minutes PT Frequency: 5 out of 7 days PT Duration Estimated Length of Stay: 12-15 days OT Intensity: Minimum of 1-2 x/day, 45 to 90 minutes OT Frequency: 5 out of 7 days OT Duration/Estimated Length of Stay: 14-17 days SLP Intensity: Minumum of 1-2 x/day, 30 to 90 minutes SLP Frequency: 3 to 5 out of 7 days SLP Duration/Estimated Length of Stay: ~2 weeks   Team Interventions: Nursing Interventions    PT interventions Ambulation/gait training, Cognitive remediation/compensation, Discharge planning, DME/adaptive equipment instruction, Functional mobility training, Pain management, Psychosocial support, Therapeutic Activities, UE/LE  Strength taining/ROM, Warden/ranger, Community reintegration, Disease management/prevention, Development worker, international aid stimulation, Neuromuscular re-education, Patient/family education, Skin care/wound management, Stair training, Therapeutic Exercise, UE/LE Coordination activities, Wheelchair propulsion/positioning  OT Interventions Warden/ranger, Firefighter, Cognitive remediation/compensation, Discharge planning, Functional mobility training, DME/adaptive equipment instruction, Disease mangement/prevention, Functional electrical stimulation, Pain management, Patient/family education, Psychosocial support, Neuromuscular re-education, Self Care/advanced ADL retraining, Therapeutic Activities, Skin care/wound managment, Splinting/orthotics, Therapeutic Exercise, UE/LE Strength taining/ROM, UE/LE Coordination activities, Visual/perceptual remediation/compensation, Wheelchair propulsion/positioning  SLP Interventions Dysphagia/aspiration precaution training, Patient/family education  TR Interventions    SW/CM Interventions Discharge Planning, Psychosocial Support, Patient/Family Education   Barriers to Discharge MD  Medical stability and Home enviroment access/loayout  Nursing      PT Incontinence, Behavior Patient has a good discharge plan, however his progress will be limited by his self-limiting behaviors  OT      SLP      SW Insurance for SNF coverage     Team Discharge Planning: Destination: PT-Home ,OT- Home , SLP-Home Projected Follow-up: PT-Home health PT, OT-  Home health OT, SLP-Home Health SLP, 24 hour supervision/assistance Projected Equipment Needs: PT- , OT- To be determined, SLP-To be determined Equipment Details: PT-TBD- Patient owns RW, MWC and SPC, OT-May need a 3-in-1 BSC Patient/family involved in discharge planning: PT- Patient,  OT-Patient, SLP-Patient  MD ELOS: 10-14 Medical Rehab Prognosis:  Excellent Assessment: The patient has  been admitted for CIR therapies with the diagnosis of pulmonary debility. The team will be addressing functional mobility, strength, stamina, balance, safety, adaptive techniques  and equipment, self-care, bowel and bladder mgt, patient and caregiver education. Goals have been set at PT/OT supervision, Anticipated discharge destination is home.        See Team Conference Notes for weekly updates to the plan of care

## 2022-01-20 NOTE — Progress Notes (Signed)
Speech Language Pathology Daily Session Note  Patient Details  Name: Eugene [name] MRN: [id number] Date of Birth: 08/29/1935  Today's Date: 01/20/2022 SLP Individual Time: 1000-1045 SLP Individual Time Calculation (min): 45 min and Today's Date: 01/20/2022 SLP Missed Time: 15 Minutes Missed Time Reason: Patient fatigue  Short Term Goals: Week 1: SLP Short Term Goal 1 (Week 1): Pt will participate in MBSS to determine least restrictive diet with 100% completion. SLP Short Term Goal 2 (Week 1): Pt will consume least restrictive diet with minimal s/sx concerning for airway invasion with stable vitals and medical status given Sup A for implementation of aspiration precautions.  Skilled Therapeutic Interventions: Skilled ST treatment focused on dysphagia goals. Pt was received semi-reclined in bed on arrival. Pt was more alert today. Required ongoing education to understand clinical reasoning for swallowing recommendations. Pt had a cup of thin liquid water with straw at bedside. The cup was less than half full and pt reported he had been consuming the water all morning, likely unsupervised. Of note, pt is on honey thick liquids with no straw precautions and full supervision recommendations. SLP educated pt of primary SLP's recommendations per clinical swallow assessment yesterday. Notified RN and NT and submitted a Safety Zone report. Pt agreeable to consume trials of thin liquids, nectar thick, and honey thick liquids. Elevated HOB to upright positioning. Pt consumed thin liquids by cup with delayed cough x1 and wet vocal quality post swallows. He consumed cup sips of nectar and honey thick liquids without overt s/sx of aspiration and clear vocal quality post swallows. Pt performed self feeding with sup A verbal cues to consume small sips and for optimal positioning. Pt declined consideration of solid trials at this time. SLP recommends pt continue with dys 1 diet and honey thick liquids until  instrumental swallow assessment can be obtained due to likelihood for fluctuating alertness d/t ongoing lethargy, deconditioning, and decreased ambulation. MBS scheduled for Monday, 8/28. Reviewed recommendations with pt who appeared reluctant yet agreeable. Provided education on swallowing precautions and strategies. Pt stated "I'll just have my family bring me whatever I want when you leave." SLP highly advised against this with ongoing education regarding risk of aspiration and adverse events attributed to aspiration. Notified RN and NT of comments. Session eventually concluded 15 minutes early due to sleepiness and poor arousal. Patient was left in bed with alarm activated and immediate needs within reach at end of session. Continue per current plan of care.      Pain Pain Assessment Pain Scale: 0-10 Pain Score: 0-No pain Pain Type: Acute pain Pain Location: Neck Pain Orientation: Left  Therapy/Group: Individual Therapy  Tamala Ser 01/20/2022, 10:49 AM

## 2022-01-21 DIAGNOSIS — J90 Pleural effusion, not elsewhere classified: Secondary | ICD-10-CM

## 2022-01-21 DIAGNOSIS — R5381 Other malaise: Secondary | ICD-10-CM | POA: Diagnosis not present

## 2022-01-21 DIAGNOSIS — R509 Fever, unspecified: Secondary | ICD-10-CM | POA: Diagnosis not present

## 2022-01-21 DIAGNOSIS — I4891 Unspecified atrial fibrillation: Secondary | ICD-10-CM | POA: Diagnosis not present

## 2022-01-21 DIAGNOSIS — M542 Cervicalgia: Secondary | ICD-10-CM | POA: Diagnosis not present

## 2022-01-21 NOTE — Progress Notes (Signed)
Occupational Therapy Session Note  Patient Details  Name: Eugene [name] MRN: [id number] Date of Birth: [birth year]  Session 1 Today's Date: 01/21/2022 OT Individual Time: 1020-1105 OT Individual Time Calculation (min): 45 min  and Today's Date: 01/21/2022 OT Missed Time: 15 Minutes Missed Time Reason: Patient fatigue   Session 2 Today's Date: 01/21/2022 OT Individual Time: 1420-1500 OT Individual Time Calculation (min): 40 min    Short Term Goals: Week 1:  OT Short Term Goal 1 (Week 1): Patient will complete sit<>stand with mod A in preparation for BADL tasks OT Short Term Goal 2 (Week 1): Patient will tolerate standing at the sink for 1 minute during BADL task. OT Short Term Goal 3 (Week 1): Patient will complete toilet transfer with mod A.  Skilled Therapeutic Interventions/Progress Updates:    Session 1 Pt received sitting with no c/o pain, agreeable to OT session. He was reluctantly agreeable to OT session, reporting fatigue. He was taken to the therapy gym via w/c. He completed a sit > stand with min A. Min A for taking several steps to the mat. He took a seated rest break before completing 2 sets of 15 reciprocal tapping with the BLE, with BUE support on the RW. CGA required throughout. He then completed 50 ft of functional mobility with (S) to stand and CGA overall using RW. Great improvement overall in dynamic balance and neck pain. Pt declining any further intervention d/t fatigue and requesting to go back to his room. He completed 10 ft of functional mobility back to his bed with (S) overall. He required CGA to transition to supine. He expressed frustration re being on thickened water and OT reinforced education on SLP recommendations and MBS taking place tomorrow. He stated "my son has deep pockets and will bring me regular water". Reinforced education and pt with no further needs. Called pt's daughter to give update on great progress today but also to reinforce SLP  recommendations re thickened water at this time until [facility] is done tomorrow. She was appreciative and supportive. Pt left supine with all needs met. 15 min missed d/t fatigue.     Session 2  Pt received supine with no c/o pain, agreeable to OT session. He completed bed mobility with (S), heavy use of grab bar. Pt completed sit > stand with min A. Min A overall for functional mobility to the w/c, mod A for R lean during transition to the w/c. Pt reporting "I don't even feel like myself that was so bad". Discussed fatigue from morning sessions and provided encouragement. He was taken to the therapy gym. He transferred to the mat with CGA overall. Pt completed standing level functional reaching task to challenge dynamic standing balance. CGA overall required. He had sudden incontinent urine episode, soiling clothing. Pt stated "I thought it may be happening" but reports no control over void. He ambulated back to his w/c with CGA using the RW and was taken back to his room. He completed sit > stand at the sink and required mod A overall for clothing change and hygiene, donning a new depends and pants. He requested to return to supine in bed. His family arrived and he had no further needs. All needs within reach.     Pt with variable HR throughout both sessions. HR never below 100 bpm and as high as 125 bpm, irregular rate, both with rest and activity. Pt asymptomatic. SpO2 stable on RA. Medical team aware.     Therapy Documentation Precautions:  Precautions  Precautions: Fall Precaution Comments: Rib fractures on L side Restrictions Weight Bearing Restrictions: No  Therapy/Group: Individual Therapy  Curtis Sites 01/21/2022, 7:30 AM

## 2022-01-21 NOTE — Progress Notes (Signed)
PROGRESS NOTE   Subjective/Complaints: No new concerns of complaints this AM. He reports he had a BM yesterday.  Anxious to go home.  Review of Systems  Constitutional:  Negative for chills, fever and weight loss.  Eyes:  Negative for double vision.  Respiratory:  Negative for cough.   Cardiovascular:  Negative for chest pain and palpitations.  Gastrointestinal: Negative.  Negative for abdominal pain and constipation.  Genitourinary: Negative.   Neurological:  Positive for weakness.    Objective:   DG Chest 2 View  Result Date: 01/19/2022 CLINICAL DATA:  Hypoxia EXAM: CHEST - 2 VIEW COMPARISON:  01/17/2022, chest CT 11/06/2005 FINDINGS: Small right and small moderate left pleural effusion. Airspace disease at left base. Cardiomegaly with vascular congestion. Aortic atherosclerosis. Tiny left apical pneumothorax noted previously is not well seen on this exam IMPRESSION: 1. Cardiomegaly with vascular congestion. Similar small moderate left effusion with dense airspace disease at the left base 2. Previously noted trace left apical pneumothorax is not well seen on this exam Electronically Signed   By: Jasmine Pang M.D.   On: 01/19/2022 18:49   Recent Labs    01/19/22 0621  WBC 8.8  HGB 11.0*  HCT 32.4*  PLT 233    No results for input(s): "NA", "K", "CL", "CO2", "GLUCOSE", "BUN", "CREATININE", "CALCIUM" in the last 72 hours.   Intake/Output Summary (Last 24 hours) at 01/21/2022 0842 Last data filed at 01/21/2022 0351 Gross per 24 hour  Intake 660 ml  Output 200 ml  Net 460 ml      Pressure Injury 01/11/22 Coccyx Medial Deep Tissue Pressure Injury - Purple or maroon localized area of discolored intact skin or blood-filled blister due to damage of underlying soft tissue from pressure and/or shear. DTI sacrum (Active)  01/11/22 2235  Location: Coccyx  Location Orientation: Medial  Staging: Deep Tissue Pressure Injury -  Purple or maroon localized area of discolored intact skin or blood-filled blister due to damage of underlying soft tissue from pressure and/or shear.  Wound Description (Comments): DTI sacrum  Present on Admission: Yes    Physical Exam: Vital Signs Blood pressure 110/84, pulse 92, temperature 98.1 F (36.7 C), temperature source Oral, resp. rate 15, height 6' (1.829 m), weight 83.2 kg, SpO2 94 %.    Assessment/Plan: 1. Functional deficits which require 3+ hours per day of interdisciplinary therapy in a comprehensive inpatient rehab setting. Physiatrist is providing close team supervision and 24 hour management of active medical problems listed below. Physiatrist and rehab team continue to assess barriers to discharge/monitor patient progress toward functional and medical goals  Care Tool:  Bathing    Body parts bathed by patient: Right arm, Left arm, Chest, Abdomen   Body parts bathed by helper: Front perineal area, Buttocks, Right upper leg, Left upper leg, Right lower leg, Left lower leg, Face     Bathing assist Assist Level: Maximal Assistance - Patient 24 - 49%     Upper Body Dressing/Undressing Upper body dressing   What is the patient wearing?: Pull over shirt    Upper body assist Assist Level: Moderate Assistance - Patient 50 - 74%    Lower Body Dressing/Undressing Lower body  dressing      What is the patient wearing?: Pants, Incontinence brief     Lower body assist Assist for lower body dressing: Total Assistance - Patient < 25%     Toileting Toileting Toileting Activity did not occur Press photographer and hygiene only): N/A (no void or bm)  Toileting assist Assist for toileting: 2 Helpers     Transfers Chair/bed transfer  Transfers assist  Chair/bed transfer activity did not occur: Safety/medical concerns  Chair/bed transfer assist level: Total Assistance - Patient < 25%     Locomotion Ambulation   Ambulation assist   Ambulation activity  did not occur: Safety/medical concerns (Unable to perform ambulation and stair mobility secondary to poor endurance/activity tolerance and global weakness.)          Walk 10 feet activity   Assist  Walk 10 feet activity did not occur: Safety/medical concerns        Walk 50 feet activity   Assist Walk 50 feet with 2 turns activity did not occur: Safety/medical concerns         Walk 150 feet activity   Assist Walk 150 feet activity did not occur: Safety/medical concerns         Walk 10 feet on uneven surface  activity   Assist Walk 10 feet on uneven surfaces activity did not occur: Safety/medical concerns         Wheelchair     Assist Is the patient using a wheelchair?: Yes Type of Wheelchair: Manual    Wheelchair assist level: Total Assistance - Patient < 25% Max wheelchair distance: Patient able to propel ~20' and then required total assistance for the remaining 130'    Wheelchair 50 feet with 2 turns activity    Assist        Assist Level: Maximal Assistance - Patient 25 - 49%   Wheelchair 150 feet activity     Assist      Assist Level: Total Assistance - Patient < 25%   Blood pressure 110/84, pulse 92, temperature 98.1 F (36.7 C), temperature source Oral, resp. rate 15, height 6' (1.829 m), weight 83.2 kg, SpO2 94 %.   Gen: no distress, normal appearing, fatiged HEENT: oral mucosa pink and moist, NCAT Cardio: tachy Chest: normal effort, normal rate of breathing Abd: soft, non-distended Ext: no edema Psych: normal affect, decreased motivation Skin: intact Neurological:     Mental Status: He is alert and oriented to person, place, and time. Diffuse weakness, left shoulder limited by pain MSK: cervical paraspinal tenderness and tenderness in b/l traps    Medical Problem List and Plan: 1. Functional deficits secondary to pulmonary debility             -patient may shower             -ELOS/Goals: 10-14 days              -Continue CIR  -Will consult SLP due to coughing with drinking water for swallow eval 2.  Antithrombotics: -DVT/anticoagulation:  Mechanical: Sequential compression devices, below knee Bilateral lower extremities -Eliquis started by cardiology             -antiplatelet therapy: N/A 3. L shoulder pain: add voltaren gel prn. Oxycodone prn.  4. Mood/Behavior/Sleep: LCSW to follow for evaluation and support.              -antipsychotic agents: N/A 5. Neuropsych/cognition: This patient is capable of making decisions on his own behalf. 6. Skin/Wound Care: Routine pressure relief  measures.              --foam dressing to DTI w/chronic tissue damage.              --continue nutritional supplements.  7. Fluids/Electrolytes/Nutrition: Monitor I/O. Check CMET in am             --continue nutritional supplements. 8. Left hemothorax: has resolved. Pulmonary hygiene.             --follow up with Pulmonary in 5 weeks w/CxR. 9. A fib: Monitor HR TID--continue Metoprolol BID  -appears to be mildly tachycardic chronically, continue to monitor, consider increase dose metoprolol  -Afib with RVR noted, Cardiology started metoprolol XL 100mg  Daily(can reduce to 38ml XL if BP dont tolerate), Eliquis 5mg  started by cardiology, appreciate cardiology assistance  -8/27 HR improved to 90s , continue to monitor 10. ABLA: Stable overall. Recheck CBC in am.             --h/o hemochromatosis.   -HGB stable 11 on 8/25, continue to monitor  -Recheck CBC tomorrow 11. H/o depression w/anxiety: Continue Xanax 0.5 mg bid w/ Seroquel 25 mg/HS.  -8/26 Seroquel was decreased to 12.5mg /HS due to sedation 12. H/o Seizure d/o: Continue Keppra bid.  13. Acute on chronic renal failure: Baseline SCr 1.27-1.3. Lasix on hold.              --has improved form 39/3.05-->25/1.37  -scr stable at 1.35 on 8/24             --encourage fluid intake. Recheck BMET 8/27 14. Airspace disease on CXR: incentive spirometer ordered. 15. Pleural  effusion: daily weights ordered to monitor for fluid overload while off of Lasix.   -Weight stable Filed Weights   01/19/22 0500 01/20/22 0536 01/21/22 0504  Weight: 83.5 kg 83 kg 83.2 kg      16. Fever of 100.8: ordered BC, UC, UA, procalcitonin, CBC, and CXR.  -WBC 9.0, procalcitonin neg, U/A pending, CXR appears similar to prior, UA ordered, no growth on blood cx, U/A-neg   -8/25 afebrile last night, WBC stable at 8.8  8/26 CXR without acute changes  -Remains afebrile 17. Fatigue: ordered iron, B12, and folate with morning labs 18. Neck pain, suspect from increased use with therapy  -Increase oxycodone dose to 5-10mg  PRN  -8/26 oxycodone was DC due to sedation, continue tylenol  -improved, denies pain        LOS: 4 days A FACE TO FACE EVALUATION WAS PERFORMED  Jennye Boroughs 01/21/2022, 8:42 AM

## 2022-01-21 NOTE — Progress Notes (Signed)
Physical Therapy Session Note  Patient Details  Name: Eugene [name] MRN: [id number] Date of Birth: Apr 22, 1936  Today's Date: 01/21/2022 PT Individual Time: 0803-0927 PT Individual Time Calculation (min): 84 min & 30 min   Short Term Goals: Week 1:  PT Short Term Goal 1 (Week 1): Patient will complete bed mobility with moderate assistance PT Short Term Goal 2 (Week 1): Patient will perform sit/stand with RW and moderate assistance PT Short Term Goal 3 (Week 1): Patient will perform stand pivot transfer with RW and moderate assistance  Skilled Therapeutic Interventions/Progress Updates:      Therapy Documentation Precautions:  Precautions Precautions: Fall Precaution Comments: Rib fractures on L side Restrictions Weight Bearing Restrictions: No  Treatment Session 1:  Pt received semi-reclined in bed and declines pain at rest. Pt reports feeling fatigue and weak and agreeable to PT session with encouragement. Pt requires min A for supine to sit. Pt dependent for threading LE's through pants and require min A in standing to don pants. Pt required CGA with STS with RW. Pt transported outside [facility] for mood relief. Pt participated in therapeutic exercises to improve LE strength. Pt performed seated marches (3 x 10) and seated resisted hip abduction with red thera band (2 x 10). Pt reported neck pain and PT provided soft tissue mobilization to upper traps and left SCM. Pt transported to room and pt reported need for bowel movement. Pt required mod A with squat pivot transfer to [facility] and to w/c. Pt left seated in w/c at bedside with chair alarm on and all needs within reach.   Treatment Session 2:  Pt received semi-reclined in bed with family present. Pt reports fatigue and agreeable to PT session with emphasis on soft tissue mobilization and flexibility to neck as pt limited due to neck pain. PT performed manual therapy with emphasize on soft tissue mobilization and trigger point release to  left SCM and upper trapezius muscles. PT followed up STM with flexibility exercises to address cervical pain and ROM deficits. Pt performed cervical rotation SNAGS 1 x 10 with a hold for 3 seconds in bilateral directions. Pt instructed to continue exercises outside of scheduled therapy. Pt left semi-reclined in bed with hot pack to left side of neck to address pain. Pt with bed alarm on and all needs within reach.   Therapy/Group: Individual Therapy  Truitt Leep Truitt Leep PT, DPT  01/21/2022, 7:44 AM

## 2022-01-22 ENCOUNTER — Inpatient Hospital Stay (HOSPITAL_COMMUNITY): Payer: Medicare Other

## 2022-01-22 DIAGNOSIS — R5381 Other malaise: Secondary | ICD-10-CM | POA: Diagnosis not present

## 2022-01-22 LAB — CBC WITH DIFFERENTIAL/PLATELET
Abs Immature Granulocytes: 0.02 10*3/uL (ref 0.00–0.07)
Basophils Absolute: 0.1 10*3/uL (ref 0.0–0.1)
Basophils Relative: 1 %
Eosinophils Absolute: 0.3 10*3/uL (ref 0.0–0.5)
Eosinophils Relative: 5 %
HCT: 35.7 % — ABNORMAL LOW (ref 39.0–52.0)
Hemoglobin: 11.8 g/dL — ABNORMAL LOW (ref 13.0–17.0)
Immature Granulocytes: 0 %
Lymphocytes Relative: 16 %
Lymphs Abs: 0.9 10*3/uL (ref 0.7–4.0)
MCH: 31.1 pg (ref 26.0–34.0)
MCHC: 33.1 g/dL (ref 30.0–36.0)
MCV: 93.9 fL (ref 80.0–100.0)
Monocytes Absolute: 0.4 10*3/uL (ref 0.1–1.0)
Monocytes Relative: 7 %
Neutro Abs: 4 10*3/uL (ref 1.7–7.7)
Neutrophils Relative %: 71 %
Platelets: 346 10*3/uL (ref 150–400)
RBC: 3.8 MIL/uL — ABNORMAL LOW (ref 4.22–5.81)
RDW: 14.3 % (ref 11.5–15.5)
WBC: 5.7 10*3/uL (ref 4.0–10.5)
nRBC: 0 % (ref 0.0–0.2)

## 2022-01-22 LAB — CULTURE, BLOOD (ROUTINE X 2)
Culture: NO GROWTH
Culture: NO GROWTH
Special Requests: ADEQUATE
Special Requests: ADEQUATE

## 2022-01-22 LAB — BASIC METABOLIC PANEL
Anion gap: 9 (ref 5–15)
BUN: 22 mg/dL (ref 8–23)
CO2: 28 mmol/L (ref 22–32)
Calcium: 8.7 mg/dL — ABNORMAL LOW (ref 8.9–10.3)
Chloride: 102 mmol/L (ref 98–111)
Creatinine, Ser: 1.15 mg/dL (ref 0.61–1.24)
GFR, Estimated: >60 mL/min (ref >60)
Glucose, Bld: 96 mg/dL (ref 70–99)
Potassium: 3.4 mmol/L — ABNORMAL LOW (ref 3.5–5.1)
Sodium: 139 mmol/L (ref 135–145)

## 2022-01-22 MED ORDER — POTASSIUM CHLORIDE CRYS ER 20 MEQ PO TBCR
20.0000 meq | EXTENDED_RELEASE_TABLET | Freq: Once | ORAL | Status: AC
  Administered 2022-01-22: 20 meq via ORAL
  Filled 2022-01-22: qty 1

## 2022-01-22 NOTE — Progress Notes (Signed)
PROGRESS NOTE   Subjective/Complaints:  Discussed MBS with SLP- occ for D2 and thins, exam c/w overall deconditioned status  Discussed bloodwork and meds with pt and daughter   Review of Systems  Constitutional:  Negative for chills, fever and weight loss.  Eyes:  Negative for double vision.  Respiratory:  Negative for cough.   Cardiovascular:  Negative for chest pain and palpitations.  Gastrointestinal: Negative.  Negative for abdominal pain and constipation.  Genitourinary: Negative.   Neurological:  Positive for weakness.    Objective:   No results found. Recent Labs    01/22/22 0619  WBC 5.7  HGB 11.8*  HCT 35.7*  PLT 346    Recent Labs    01/22/22 0619  NA 139  K 3.4*  CL 102  CO2 28  GLUCOSE 96  BUN 22  CREATININE 1.15  CALCIUM 8.7*     Intake/Output Summary (Last 24 hours) at 01/22/2022 5993 Last data filed at 01/22/2022 0100 Gross per 24 hour  Intake 600 ml  Output 100 ml  Net 500 ml      Pressure Injury 01/11/22 Coccyx Medial Deep Tissue Pressure Injury - Purple or maroon localized area of discolored intact skin or blood-filled blister due to damage of underlying soft tissue from pressure and/or shear. DTI sacrum (Active)  01/11/22 2235  Location: Coccyx  Location Orientation: Medial  Staging: Deep Tissue Pressure Injury - Purple or maroon localized area of discolored intact skin or blood-filled blister due to damage of underlying soft tissue from pressure and/or shear.  Wound Description (Comments): DTI sacrum  Present on Admission: Yes    Physical Exam: Vital Signs Blood pressure (!) 140/84, pulse 91, temperature 98.5 F (36.9 C), temperature source Oral, resp. rate 16, height 6' (1.829 m), weight 80 kg, SpO2 96 %.  General: No acute distress Mood and affect are appropriate Heart: Regular rate and rhythm no rubs murmurs or extra sounds Lungs: Clear to auscultation, breathing  unlabored, no rales or wheezes Abdomen: Positive bowel sounds, soft nontender to palpation, nondistended Extremities: No clubbing, cyanosis, or edema Skin: No evidence of breakdown, no evidence of rash Neurologic: Cranial nerves II through XII intact, motor strength is 4/5 in bilateral deltoid, bicep, tricep, grip, hip flexor, knee extensors, ankle dorsiflexor and plantar flexor Sensory exam normal sensation to light touch and proprioception in bilateral upper and lower extremities Cerebellar exam normal finger to nose to finger as well as heel to shin in bilateral upper and lower extremities Musculoskeletal: Full range of motion in all 4 extremities. No joint swelling    Assessment/Plan: 1. Functional deficits which require 3+ hours per day of interdisciplinary therapy in a comprehensive inpatient rehab setting. Physiatrist is providing close team supervision and 24 hour management of active medical problems listed below. Physiatrist and rehab team continue to assess barriers to discharge/monitor patient progress toward functional and medical goals  Care Tool:  Bathing    Body parts bathed by patient: Right arm, Left arm, Chest, Abdomen   Body parts bathed by helper: Front perineal area, Buttocks, Right upper leg, Left upper leg, Right lower leg, Left lower leg, Face     Bathing assist Assist Level: Maximal  Assistance - Patient 24 - 49%     Upper Body Dressing/Undressing Upper body dressing   What is the patient wearing?: Pull over shirt    Upper body assist Assist Level: Moderate Assistance - Patient 50 - 74%    Lower Body Dressing/Undressing Lower body dressing      What is the patient wearing?: Pants, Incontinence brief     Lower body assist Assist for lower body dressing: Total Assistance - Patient < 25%     Toileting Toileting Toileting Activity did not occur Press photographer and hygiene only): N/A (no void or bm)  Toileting assist Assist for toileting: 2  Helpers     Transfers Chair/bed transfer  Transfers assist  Chair/bed transfer activity did not occur: Safety/medical concerns  Chair/bed transfer assist level: Minimal Assistance - Patient > 75%     Locomotion Ambulation   Ambulation assist   Ambulation activity did not occur: Safety/medical concerns (Unable to perform ambulation and stair mobility secondary to poor endurance/activity tolerance and global weakness.)          Walk 10 feet activity   Assist  Walk 10 feet activity did not occur: Safety/medical concerns        Walk 50 feet activity   Assist Walk 50 feet with 2 turns activity did not occur: Safety/medical concerns         Walk 150 feet activity   Assist Walk 150 feet activity did not occur: Safety/medical concerns         Walk 10 feet on uneven surface  activity   Assist Walk 10 feet on uneven surfaces activity did not occur: Safety/medical concerns         Wheelchair     Assist Is the patient using a wheelchair?: Yes Type of Wheelchair: Manual    Wheelchair assist level: Total Assistance - Patient < 25% Max wheelchair distance: Patient able to propel ~20' and then required total assistance for the remaining 130'    Wheelchair 50 feet with 2 turns activity    Assist        Assist Level: Maximal Assistance - Patient 25 - 49%   Wheelchair 150 feet activity     Assist      Assist Level: Total Assistance - Patient < 25%   Blood pressure (!) 140/84, pulse 91, temperature 98.5 F (36.9 C), temperature source Oral, resp. rate 16, height 6' (1.829 m), weight 80 kg, SpO2 96 %.   Gen: no distress, normal appearing, fatiged HEENT: oral mucosa pink and moist, NCAT Cardio: tachy Chest: normal effort, normal rate of breathing Abd: soft, non-distended Ext: no edema Psych: normal affect, decreased motivation Skin: intact Neurological:     Mental Status: He is alert and oriented to person, place, and time.  Diffuse weakness, left shoulder limited by pain MSK: cervical paraspinal tenderness and tenderness in b/l traps    Medical Problem List and Plan: 1. Functional deficits secondary to pulmonary debility             -patient may shower             -ELOS/Goals: 10-14 days             -Continue CIR  -Will consult SLP due to coughing with drinking water for swallow eval 2.  Antithrombotics: -DVT/anticoagulation:  Mechanical: Sequential compression devices, below knee Bilateral lower extremities -Eliquis started by cardiology             -antiplatelet therapy: N/A 3. L shoulder pain: add  voltaren gel prn. Oxycodone prn.  4. Mood/Behavior/Sleep: LCSW to follow for evaluation and support.              -antipsychotic agents: N/A 5. Neuropsych/cognition: This patient is capable of making decisions on his own behalf. 6. Skin/Wound Care: Routine pressure relief measures.              --foam dressing to DTI w/chronic tissue damage.              --continue nutritional supplements.  7. Fluids/Electrolytes/Nutrition: Monitor I/O. Check CMET in am             --continue nutritional supplements. 8. Left hemothorax: has resolved. Pulmonary hygiene.             --follow up with Pulmonary in 5 weeks w/CxR. 9. A fib: Monitor HR TID--continue Metoprolol BID  -appears to be mildly tachycardic chronically, continue to monitor, consider increase dose metoprolol  -Afib with RVR noted, Cardiology started metoprolol XL 100mg  Daily(can reduce to 75ml XL if BP dont tolerate), Eliquis 5mg  started by cardiology, appreciate cardiology assistance  -8/27 HR improved to 90s , continue to monitor 10. ABLA: Stable overall. Recheck CBC in am.             --h/o hemochromatosis.       Latest Ref Rng & Units 01/22/2022    6:19 AM 01/19/2022    6:21 AM 01/18/2022    5:29 AM  CBC  WBC 4.0 - 10.5 K/uL 5.7  8.8  9.0   Hemoglobin 13.0 - 17.0 g/dL 01/21/2022  01/20/2022  02.5   Hematocrit 39.0 - 52.0 % 35.7  32.4  30.6   Platelets 150 -  400 K/uL 346  233  214    Slowly improving , normochromic normocytic  11. H/o depression w/anxiety: Continue Xanax 0.5 mg bid w/ Seroquel 25 mg/HS.  -8/26 Seroquel was decreased to 12.5mg /HS due to sedation 12. H/o Seizure d/o: Continue Keppra bid.  13. Acute on chronic renal failure: Baseline SCr 1.27-1.3. Lasix on hold.                 Latest Ref Rng & Units 01/22/2022    6:19 AM 01/18/2022    5:29 AM 01/17/2022    1:48 AM  BMP  Glucose 70 - 99 mg/dL 96  99  97   BUN 8 - 23 mg/dL 22  21  25    Creatinine 0.61 - 1.24 mg/dL 01/20/2022  01/19/2022    Sodium 135 - 145 mmol/L 139  135  135   Potassium 3.5 - 5.1 mmol/L 3.4  3.6  3.9   Chloride 98 - 111 mmol/L 102  101  101   CO2 22 - 32 mmol/L 28  25  26    Calcium 8.9 - 10.3 mg/dL 8.7  8.4  8.5    Improved may resume Lasix if needed .  No LE edema, diminished Breath sounds left base , EF is normal  14. Airspace disease on CXR: incentive spirometer ordered. 15. Pleural effusion: daily weights ordered to monitor for fluid overload while off of Lasix.   -Weight stable Filed Weights   01/20/22 0536 01/21/22 0504 01/22/22 0356  Weight: 83 kg 83.2 kg 80 kg      17. Fatigue: ordered iron, B12, and folate with morning labs 18. Neck pain, suspect from increased use with therapy  -Increase oxycodone dose to 5-10mg  PRN  -8/26 oxycodone was DC due to sedation, continue tylenol  -improved, denies pain  19.  HypoK+ mild, likely nutritional , no diuretics  oral supplement x 1 - recheck BMET in am   20.  GERD- cont PPI, up in chair for meals     LOS: 5 days A FACE TO FACE EVALUATION WAS PERFORMED  Erick Colace 01/22/2022, 9:27 AM

## 2022-01-22 NOTE — Progress Notes (Signed)
Modified Barium Swallow Progress Note  Patient Details  Name: EUNICE OLDAKER MRN: [id number] Date of Birth: [birth year]  Today's Date: 01/22/2022  Modified Barium Swallow completed.  Full report located under Chart Review in the Imaging Section.  Brief recommendations include the following:  Clinical Impression MBSS completed this date secondary to consistent s/sx concerning for airway invasion with intake of thin liquids during CSE. Pt presents with overall mild oropharyngeal dysphagia that appears to be r/t generalized weakness superimposed on presbyphagia changes that were likely present at baseline. Additionally, pt presents with evidence of retrograde bolus flow through the cervical esophagus to the level of pyriform sinuses, which flowed into the interarytenoid space x 1. Oral phase remarkable for lingual weakness, decreased bolus manipulation + cohesion, and premature spillage. Pharyngeal phase remarkable for decreased pharyngeal drive/peristalsis, decreased base of tongue approximation to posterior pharyngeal wall, and reduced laryngeal closure with large boluses.  Aforementioned deficits resulted in shallow penetration of thin liquid via cup and deep penetration with thin liquid via straw; however, no aspiration visualized. No penetration nor aspiration appreciated with intake of nectar-thick liquid via cup and straw, puree, and mechanical soft textures. Generalized oropharyngeal weakness resulted in mild to moderate pharyngeal residuals within the vallecular space and trace to mild along the lateral channels, posterior pharyngeal wall, and within the pyriform sinuses. Cued effortful swallow and multiple swallows per bolus (2-3) appeared beneficial in clearing pharyngeal stasis, which was noted to reduce from moderate to minimal, minimal to trace. Volitional throat clear noted x 1 with visualization of retrograde flow coming back up through the UES and entering the interarytenoid space;  scant in volume. Throat clear cleared penetrated material with cued secondary swallow clearing residual.   Given instrumental finding, pt's preference, and current medical + cognitive status, recommend initiation of Dysphagia 2 diet with thin liquids (no straws) with strict adherence to general aspiration and reflux precautions outlined below. May upgrade solid textures, clinically, as pt's endurance and strength improves. If pt exhibits s/sx concerning for airway invasion and demonstrates change in vital signs and medical status, with thin liquid intake, may downgrade to nectar-thick liquids. Recommend medications be crushed or given whole (if unable to be crushed) in puree. May consider GI consult given esophageal findings for further work-up, if pt is amenable. This date, pt reports he does not have an interest in following up with a GI provider. Will notify family of findings. Pt, LPN, MD, and pt's daughter educated on findings and recommendations, and in agreement with proposed plan. Please see below for additional details.     Swallow Evaluation Recommendations   Recommended Consults: Consider GI evaluation;Consider esophageal assessment   SLP Diet Recommendations: Dysphagia 2 (Fine chop) solids;Thin liquid   Liquid Administration via: Cup;No straw   Medication Administration: Crushed with puree   Supervision: Patient able to self feed;Full supervision/cueing for compensatory strategies   Compensations: Minimize environmental distractions;Slow rate;Small sips/bites;Clear throat intermittently;Effortful swallow;Multiple dry swallows after each bite/sip   Postural Changes: Remain semi-upright after after feeds/meals (Comment);Seated upright at 90 degrees (Remain upright for 30 minutes after meals)   Oral Care Recommendations: Oral care BID        Elwyn Klosinski A Alaja Goldinger 01/22/2022,1:34 PM

## 2022-01-22 NOTE — Progress Notes (Signed)
Physical Therapy Session Note  Patient Details  Name: Eugene [name] MRN: [id number] Date of Birth: 03-Apr-1936  Today's Date: 01/22/2022 PT Individual Time: 1st Session: 1115-1200; 2nd Session: 1400-1510 PT Individual Time Calculation (min): 45 min; 70 min  Short Term Goals: Week 1:  PT Short Term Goal 1 (Week 1): Patient will complete bed mobility with moderate assistance PT Short Term Goal 2 (Week 1): Patient will perform sit/stand with RW and moderate assistance PT Short Term Goal 3 (Week 1): Patient will perform stand pivot transfer with RW and moderate assistance  Skilled Therapeutic Interventions/Progress Updates:  Treatment Session 1:  Patient greeted supine in the bed with daughter present and agreeable to PT treatment session. Patient reporting moderate pain in L neck muscles during treatment session, however this does not limit his participation in treatment session.   Patient requesting to use the restroom at the start of treatment session. Patient transitioned from supine to sitting EOB with the use of bed rail and supervision for safety.   Patient then transitioned from sit to stand with RW and CGA/MinA- VC for proper hand placemnet and increased anterior weight shift.   Patient gait trained 2 x 15' to/from his bed and the bathroom in his room- Patient stood to toilet with GA for safety and then was able to stand at the sink to wash his hands without UE and CGA.  Patient gait trained 2 x 150' with RW and CGA for safety- VC for improved postural extension and B step length with good improvements noted with cues, however unable to sustain secondary to fatigue.   Sit/stand, 2 x 5 with RW and CGA/SBA for safety- VC for scooting forward and increased anteior weight shift prior to standing with good improvements noted. At times, patient's R LE scoots too far out in front of him and he requires cues for tucking underneath for improved BOS.   Patient returned to his room sitting  upright in wheelchair with posey belt on, call bell within reach, daughter present and all needs met.    Treatment Session 2:  Patient greeted sitting upright in wheelchair in room and agreeable to PT treatment session. Patient reporting discomfort in L neck muscles this afternoon and requesting heating pad after treatment session- Therapist discussed with RN regarding ordering new k-pad since patient reports his broke.   Patient performed various sit/stands throughout treatment session with and without rolling walker and CGA for safety. Patient performed various transfers with RW and CGA for safety with VC for staying within the RW and taking it with him throughout the turn.   Patient stood without UE support and reached for bean bags on his R side and then tossed them at the [employer] with CGA for safety- Patient completed x9 tosses prior to requesting to use the restroom. After using the restroom patient completed x7 tosses.   Patient gait trained 2 x 30' to the restroom in rehab gym with RW and CGA/SBA for safety. Patient was able to manage pants/brief and use the grab bar to assist with lowering onto the toilet. When transitioning from sit to stand, patient required ModA to initiate standing secondary to low toilet seat height. Patient reporting "dizziness" after exertion put forth to stand so hand sanitizer was used once seated in wc.   Patient stood without UE support and reached across his body with his R UE for bean bags and then tossed them at the corn hole board- Patient completed x16. Therapist providing CGA with no  LOB noted, however patient with increased rigidity when trying to rotate and reach across his body.   Patient gait trained x120' from rehab gym back to his room with RW and CGA/SBA for safety- VC for improved postural extension and shoulder depression with mild improvements noted.   Patient transitioned from sitting EOB in room to supine with supervision, however  reporting need to use the restroom so patient then transitioned from supine back to sitting EOB with supervision for safety. Patient performed sit/stand from elevated bed with RW and CGA. Patient gait trained ~15' to/from bathroom in his room with RW and CGA for safety- VC for stepping over lip in bathroom/maneuvering RW over the threshold. Patient stood to urinate with S UE support and CGA for safety. Patient was able to manage pants independently. Patient stood at the sink unsupported ~2 minutes while washing his hands with CGA. Patient then transitioned from sitting EOB to supine with supervision- Attempted to have patient scoot toward [facility], however unable and second therapist required to boost patient up toward [facility].   Patient required extended seated rest breaks throughout treatment session secondary to impaired endurance/activity tolerance and global weakness/fatigue.  Patient returned to his room supine in bed with call bell within reach, B heels floated and all needs met.   Therapy Documentation Precautions:  Precautions Precautions: Fall Precaution Comments: Rib fractures on L side Restrictions Weight Bearing Restrictions: No    Therapy/Group: Individual Therapy  Jewelene Mairena 01/22/2022, 7:48 AM

## 2022-01-22 NOTE — Progress Notes (Signed)
Occupational Therapy Session Note  Patient Details  Name: Eugene [name] MRN: [id number] Date of Birth: [birth year]  Today's Date: 01/22/2022 OT Individual Time: 0945-1100 OT Individual Time Calculation (min): 75 min    Short Term Goals: Week 1:  OT Short Term Goal 1 (Week 1): Patient will complete sit<>stand with mod A in preparation for BADL tasks OT Short Term Goal 2 (Week 1): Patient will tolerate standing at the sink for 1 minute during BADL task. OT Short Term Goal 3 (Week 1): Patient will complete toilet transfer with mod A.  Skilled Therapeutic Interventions/Progress Updates:    Pt received supine with no c/o pain, agreeable to OT session. He was pleased with results of his MBS, now on thin liquids and D2 diet. He came to EOB with (S), increased effort and bed rail use with airbed. Pt incontint of urine in brief. He completed functional mobility to the sink with CGA using the RW. He was able to complete peri hygiene in standing with close (S). He reported urge to have BM and was able to complete ambulatory transfer into the bathroom with close CGA. He declined shower despite OT attempts to encourage hygiene. He voided BM and was able to complete all hygiene in standing with close (S). He donned a new brief and pants with min A. He completed an ambulatory transfer back to the chair with close CGA. He took a seated rest break as his daughter and MD arrived for rounding. He completed 118 ft of functional mobility to the therapy gym with CGA overall. Slow pace and cueing required for safer turning technique as he rapidly descended to the w/c. He took an extended rest break with vitals monitoring- better HR control over with a more stable rate at 101 bpm. He completed 1x20 repetitions of reciprocal tapping with his BLE onto a 4 in step to challenge dynamic standing balance and threshold management at home. He returned to his room via 118 ft of functional mobility with the RW. He was left supine  with all needs met, daughter present.    Therapy Documentation Precautions:  Precautions Precautions: Fall Precaution Comments: Rib fractures on L side Restrictions Weight Bearing Restrictions: No  Therapy/Group: Individual Therapy  Curtis Sites 01/22/2022, 6:26 AM

## 2022-01-22 NOTE — Progress Notes (Signed)
Physical Therapy Session Note  Patient Details  Name: Eugene [name] MRN: [id number] Date of Birth: [birth year]  Today's Date: 01/22/2022 PT Individual Time: 1301-1327 PT Individual Time Calculation (min): 26 min   Short Term Goals: Week 1:  PT Short Term Goal 1 (Week 1): Patient will complete bed mobility with moderate assistance PT Short Term Goal 2 (Week 1): Patient will perform sit/stand with RW and moderate assistance PT Short Term Goal 3 (Week 1): Patient will perform stand pivot transfer with RW and moderate assistance  Skilled Therapeutic Interventions/Progress Updates:   Received pt sitting in [facility] with family present at bedside. Pt agreeable to PT treatment, and denied any pain during session. Session with emphasis on functional mobility/transfers and generalized strengthening and endurance. Pt transferred sit<>stand with RW and CGA x 3 trials and performed the following exercises with CGA for balance with emphasis on LE strength/ROM: -standing alternating marches 2x12 -standing heel raises 2x12 -seated hip abduction with red TB 2x15 -standing mini-squats 2x10 Pt required numerous and extended rest break in between exercises. Concluded session with pt sitting in WC, needs within reach, and seatbelt alarm on.    Therapy Documentation Precautions:  Precautions Precautions: Fall Precaution Comments: Rib fractures on L side Restrictions Weight Bearing Restrictions: No  Therapy/Group: Individual Therapy Martin Majestic PT, DPT  01/22/2022, 7:25 AM

## 2022-01-23 DIAGNOSIS — R1312 Dysphagia, oropharyngeal phase: Secondary | ICD-10-CM

## 2022-01-23 DIAGNOSIS — M542 Cervicalgia: Secondary | ICD-10-CM | POA: Diagnosis not present

## 2022-01-23 DIAGNOSIS — R5381 Other malaise: Secondary | ICD-10-CM | POA: Diagnosis not present

## 2022-01-23 DIAGNOSIS — F329 Major depressive disorder, single episode, unspecified: Secondary | ICD-10-CM | POA: Diagnosis not present

## 2022-01-23 DIAGNOSIS — E876 Hypokalemia: Secondary | ICD-10-CM

## 2022-01-23 LAB — BASIC METABOLIC PANEL
Anion gap: 9 (ref 5–15)
BUN: 23 mg/dL (ref 8–23)
CO2: 26 mmol/L (ref 22–32)
Calcium: 8.6 mg/dL — ABNORMAL LOW (ref 8.9–10.3)
Chloride: 102 mmol/L (ref 98–111)
Creatinine, Ser: 1.29 mg/dL — ABNORMAL HIGH (ref 0.61–1.24)
GFR, Estimated: 55 mL/min — ABNORMAL LOW (ref >60)
Glucose, Bld: 99 mg/dL (ref 70–99)
Potassium: 3.9 mmol/L (ref 3.5–5.1)
Sodium: 137 mmol/L (ref 135–145)

## 2022-01-23 NOTE — Plan of Care (Signed)
  Problem: RH Swallowing Goal: LTG Patient will consume least restrictive diet using compensatory strategies with assistance (SLP) Description: LTG:  Patient will consume least restrictive diet using compensatory strategies with assistance (SLP) Flowsheets (Taken 01/23/2022 1304) LTG: Pt Patient will consume least restrictive diet using compensatory strategies with assistance of (SLP): Supervision Goal: LTG Pt will demonstrate functional change in swallow as evidenced by bedside/clinical objective assessment (SLP) Description: LTG: Patient will demonstrate functional change in swallow as evidenced by bedside/clinical objective assessment (SLP) Flowsheets (Taken 01/23/2022 1304) LTG: Patient will demonstrate functional change in swallow as evidenced by bedside/clinical objective assessment: Oropharyngeal swallow

## 2022-01-23 NOTE — Progress Notes (Signed)
Physical Therapy Session Note  Patient Details  Name: Eugene [name] MRN: [id number] Date of Birth: [birth year]  Today's Date: 01/23/2022 PT Individual Time: 8938-1017 PT Individual Time Calculation (min): 75 min   Short Term Goals: Week 1:  PT Short Term Goal 1 (Week 1): Patient will complete bed mobility with moderate assistance PT Short Term Goal 2 (Week 1): Patient will perform sit/stand with RW and moderate assistance PT Short Term Goal 3 (Week 1): Patient will perform stand pivot transfer with RW and moderate assistance  Skilled Therapeutic Interventions/Progress Updates:  1st Treatment Session:  Patient greeted supine in bed and agreeable to PT treatment session. Patient does not report any pain at this time, however states he has not been able to get a hold of his wife and is very concerned about her. Therapist and patient problem-solved through to find a solution and will communicate with his daughter. Patient reporting increased lethargy and not being able to "focus" this treatment session.   Patient transitioned from supine to sitting EOB with the use of a bed rail and supervision. Once sitting EOB, patient required a seated rest break prior to transitioning to standing with the use of a RW and CGA for safety- VC for increased anterior weight shift and maintaining toes in contact with the floor.   Patient performed various stand pivot transfers throughout treatment session with the use of a RW and CGA/SBA for safety. VC for staying within the RW and proper hand placement.   Once in the day room rehab gym, patient reporting need to use the restroom- Patient ambulated ~20' to/from the bathroom in the rehab gym with the use of a RW and SBA for safety. Patient was able to manage pants/brief with CGA for safety. Patient demonstrated good sitting balance without UE support. Therapist donned new brief after BM and performed pericare with total assist secondary to patient reporting  fatigue. Patient required MinA for standing from low seated height of toilet. Once standing therapist provided [name] for managing pants/brief.   Patient gait trained x150', x75' with RW and SBA for safety- Patient with improved postural extension, however continues to demonstrate decreased cadence and decreased B step length. VC for increased B step length with minimal improvements noted.   Patient gait trained through an obstacle course with RW and SBA/CGA for safety- Patient stepped over x3 arm weights then weaved between 7 cones. During trial, patient reported need to have another BM. Patient ambulated to bathroom in rehab gym and toileting performed the same as above with the same amount of assistance secondary to fatigue. Extended seated rest break required after toileting.   Patient weaved between x7 cones with RW and SBA for safety- Patient demonstrated good awareness and ability to complete activity without knocking any cones over.    Patient returned to his room sitting upright in wheelchair with posey belt on, call bell within reach, tray table in front and all needs met.    2nd Treatment Session:  Patient greeted supine in bed and agreeable to PT treatment session. Patient reporting increased lethargy this afternoon and reluctant to participate, however with increased time and encouragement he was willing.   Patient transitioned from supine to sitting EOB with the use of bed rail and supervision for safety.   Patient transitioned from sitting EOB to standing with RW and minimal assistance for lifting secondary to low bed height and compliant surface of air bed.   Patient gait trained to/from the rehab gym and his room (~  150') with the use of a rolling walker and SBA for safety. Patient required increased time to complete gait trial with decreased cadence and standing rest breaks due to fatigue.   Patient performed the Nustep x5 minutes total on level 4 resistance- Patient required a  ~30-60 seconds rest break after 1:36 and 2:21.  At end of treatment session, patient transitioned from sitting EOB to supine with supervision. Once supine, patient requested to use the restroom. Patient then transitioned to sitting EOB with supervision. MinA required for transitioning from sit to stand with the use of RW.   Patient ambulated ~15' to the restroom with RW and SBA- Patient was able to manage pants and brief. Once sitting, patient educated on calling RN staff prior to standing- Patient agreeable.   Patient left sitting on the commode with RW in front of him, call bell nearby and agreeable to calling staff prior to standing. RN, Eugene [name], and NT, Eugene [name], notified of patient positioning and willing to assist.    Therapy Documentation Precautions:  Precautions Precautions: Fall Precaution Comments: Rib fractures on L side Restrictions Weight Bearing Restrictions: No   Therapy/Group: Individual Therapy  Eugene [name] 01/23/2022, 7:45 AM

## 2022-01-23 NOTE — Progress Notes (Signed)
PROGRESS NOTE   Subjective/Complaints: Pt up with therapy. Has some back and neck soreness but feeling fairly well this morning. Asked about kpad which was ordered. Did not report to me any coughing issues this am  Review of Systems  Constitutional:  Negative for chills, fever and weight loss.  Eyes:  Negative for double vision.  Respiratory:  Negative for cough.   Cardiovascular:  Negative for chest pain and palpitations.  Gastrointestinal: Negative.  Negative for abdominal pain and constipation.  Genitourinary: Negative.   Neurological:  Positive for weakness.    Objective:   DG Swallowing Func-Speech Pathology  Result Date: 01/22/2022 Table formatting from the original result was not included. Images from the original result were not included. Objective Swallowing Evaluation: Type of Study: MBS-Modified Barium Swallow Study  Patient Details Name: Eugene [name] MRN: [id number] Date of Birth: September 15, 1935 Today's Date: 01/22/2022 Time: No data recorded-No data recorded No data recorded Past Medical History: Past Medical History: Diagnosis Date  AAA (abdominal aortic aneurysm) (HCC) 06/29/2007  stent graft  Aortic stenosis 09/05/2017  Mild AS/AR by echo 01/2017.  GERD (gastroesophageal reflux disease)   Hemochromatosis   Hyperlipidemia   Hypertension   Irritable bowel syndrome   Permanent atrial fibrillation (HCC)   Stroke (HCC)   Transient Ischemic Attack Past Surgical History: Past Surgical History: Procedure Laterality Date  ABDOMINAL AORTIC ANEURYSM REPAIR    ENDOVASCULAR STENT INSERTION  07/18/2007  saccular infrarenal aortic aneurysm  EYE SURGERY Bilateral   Cataract    IR FLUORO GUIDE CV LINE LEFT  12/08/2018  IR FLUORO GUIDE CV LINE RIGHT  02/13/2017  IR FLUORO GUIDE CV LINE RIGHT  02/25/2017  IR US GUIDE VASC ACCESS RIGHT  02/13/2017  PICC LINE PLACE PERIPHERAL ([facility] HX)  Oct 21, 2014 HPI: History of Present Illness: Pt is an 86 y/o male  with PMH of HTN, HLD, CVA in 2007, AAA s/p stent, Afib, HFpEF (last ECHO 50-55% in 2023), seizures, and hemachromatosis, follows with Dr. Leonides Schanz.  He was admitted to [facility] on 01/11/22 after falling and landing on his left rib cage with c/o SOB and rib pain.  Found to have large hemo/pneumothorax.  Received total 3 units PRBCs.  Chest tube d/c'd on 8/21 and repeat chest xrays stable.  Therapy evaluations were completed and pt was recommended for CIR.  No data recorded  Recommendations for follow up therapy are one component of a multi-disciplinary discharge planning process, led by the attending physician.  Recommendations may be updated based on patient status, additional functional criteria and insurance authorization. Assessment / Plan / Recommendation   01/22/2022   1:32 PM Clinical Impressions Clinical Impression MBSS completed this date secondary to consistent s/sx concerning for airway invasion with intake of thin liquids during CSE. Pt presents with overall mild oropharyngeal dysphagia that appears to be r/t generalized weakness superimposed on presbyphagia changes that were likely present at baseline. Additionally, pt presents with evidence of retrograde bolus flow through the cervical esophagus to the level of pyriform sinuses, which flowed into the interarytenoid space x 1. Oral phase remarkable for lingual weakness, decreased bolus manipulation + cohesion, and premature spillage. Pharyngeal phase remarkable for decreased pharyngeal  drive/peristalsis, decreased base of tongue approximation to posterior pharyngeal wall, and reduced laryngeal closure with large boluses. Aforementioned deficits resulted in shallow penetration of thin liquid via cup and deep penetration with thin liquid via straw; however, no aspiration visualized. No penetration nor aspiration appreciated with intake of nectar-thick liquid via cup and straw, puree, and mechanical soft textures. Generalized oropharyngeal weakness resulted  in mild to moderate pharyngeal residuals within the vallecular space and trace to mild along the lateral channels, posterior pharyngeal wall, and within the pyriform sinuses. Cued effortful swallow and multiple swallows per bolus (2-3) appeared beneficial in clearing pharyngeal stasis, which was noted to reduce from moderate to minimal, minimal to trace. Volitional throat clear noted x 1 with visualization of retrograde flow coming back up through the UES and entering the interarytenoid space; scant in volume. Throat clear cleared penetrated material with cued secondary swallow clearing residual. Given instrumental finding, pt's preference, and current medical + cognitive status, recommend initiation of Dysphagia 2 diet with thin liquids (no straws) with strict adherence to general aspiration and reflux precautions outlined below. May upgrade solid textures, clinically, as pt's endurance and strength improves. If pt exhibits s/sx concerning for airway invasion and demonstrates change in vital signs and medical status, with thin liquid intake, may downgrade to nectar-thick liquids. Recommend medications be crushed or given whole (if unable to be crushed) in puree. May consider GI consult given esophageal findings for further work-up, if pt is amenable. This date, pt reports he does not have an interest in following up with a GI provider. Will notify family of findings. Pt, LPN, MD, and pt's daughter educated on findings and recommendations, and in agreement with proposed plan. Please see below for additional details.   SLP Visit Diagnosis Dysphagia, oropharyngeal phase (R13.12) Impact on safety and function Mild aspiration risk     08/26/2021   2:00 PM Treatment Recommendations Treatment Recommendations No treatment recommended at this time     01/22/2022   1:33 PM Prognosis Prognosis for Safe Diet Advancement Good   01/22/2022   1:32 PM Diet Recommendations SLP Diet Recommendations Dysphagia 2 (Fine chop) solids;Thin  liquid Liquid Administration via Cup;No straw Medication Administration Crushed with puree Compensations Minimize environmental distractions;Slow rate;Small sips/bites;Clear throat intermittently;Effortful swallow;Multiple dry swallows after each bite/sip Postural Changes Remain semi-upright after after feeds/meals (Comment);Seated upright at 90 degrees     01/22/2022   1:32 PM Other Recommendations Recommended Consults Consider GI evaluation;Consider esophageal assessment Oral Care Recommendations Oral care BID    No data to display        01/22/2022   1:24 PM Oral Phase Oral Phase Impaired Oral - Nectar Teaspoon NT Oral - Nectar Cup Weak lingual manipulation;Lingual pumping;Lingual/palatal residue;Delayed oral transit;Decreased bolus cohesion;Premature spillage Oral - Nectar Straw Weak lingual manipulation;Lingual pumping;Lingual/palatal residue;Delayed oral transit;Decreased bolus cohesion;Premature spillage Oral - Thin Teaspoon Weak lingual manipulation;Lingual pumping;Decreased bolus cohesion;Premature spillage Oral - Thin Cup Weak lingual manipulation;Lingual pumping;Lingual/palatal residue;Delayed oral transit;Decreased bolus cohesion;Premature spillage Oral - Thin Straw Weak lingual manipulation;Lingual pumping;Delayed oral transit;Decreased bolus cohesion;Premature spillage;Lingual/palatal residue Oral - Puree Weak lingual manipulation;Lingual pumping;Delayed oral transit;Decreased bolus cohesion;Premature spillage Oral - Mech Soft Weak lingual manipulation;Decreased bolus cohesion;Piecemeal swallowing Oral - Regular NT Oral - Multi-Consistency NT Oral - Pill NT    01/22/2022   1:26 PM Pharyngeal Phase Pharyngeal Phase Impaired Pharyngeal- Nectar Teaspoon NT Pharyngeal- Nectar Cup Delayed swallow initiation-vallecula;Reduced pharyngeal peristalsis;Reduced tongue base retraction;Pharyngeal residue - valleculae;Pharyngeal residue - pyriform;Pharyngeal residue - posterior pharnyx;Lateral channel residue  Pharyngeal Material  does not enter airway Pharyngeal- Nectar Straw Delayed swallow initiation-vallecula;Reduced pharyngeal peristalsis;Reduced tongue base retraction;Pharyngeal residue - valleculae;Pharyngeal residue - pyriform;Pharyngeal residue - posterior pharnyx;Lateral channel residue Pharyngeal Material does not enter airway Pharyngeal- Thin Teaspoon Delayed swallow initiation-vallecula;Reduced pharyngeal peristalsis;Reduced tongue base retraction;Pharyngeal residue - pyriform;Pharyngeal residue - valleculae;Pharyngeal residue - posterior pharnyx;Lateral channel residue Pharyngeal Material does not enter airway Pharyngeal- Thin Cup Delayed swallow initiation-vallecula;Reduced pharyngeal peristalsis;Reduced tongue base retraction;Penetration/Aspiration during swallow;Pharyngeal residue - valleculae;Pharyngeal residue - pyriform;Pharyngeal residue - posterior pharnyx;Lateral channel residue Pharyngeal Material enters airway, remains ABOVE vocal cords and not ejected out Pharyngeal- Thin Straw Delayed swallow initiation-vallecula;Delayed swallow initiation-pyriform sinuses;Reduced pharyngeal peristalsis;Reduced tongue base retraction;Penetration/Aspiration during swallow;Pharyngeal residue - valleculae;Pharyngeal residue - pyriform;Pharyngeal residue - posterior pharnyx;Inter-arytenoid space residue;Lateral channel residue Pharyngeal Material enters airway, remains ABOVE vocal cords and not ejected out Pharyngeal- Puree Delayed swallow initiation-vallecula;Reduced pharyngeal peristalsis;Reduced tongue base retraction;Pharyngeal residue - pyriform;Pharyngeal residue - valleculae;Lateral channel residue Pharyngeal Material does not enter airway Pharyngeal- Mechanical Soft Delayed swallow initiation-vallecula;Reduced pharyngeal peristalsis;Reduced tongue base retraction;Pharyngeal residue - valleculae Pharyngeal Material does not enter airway Pharyngeal- Regular NT Pharyngeal- Multi-consistency NT Pharyngeal- Pill  NT    01/22/2022   1:28 PM Cervical Esophageal Phase  Cervical Esophageal Phase Impaired Thin Teaspoon WFL Thin Cup Esophageal backflow into the pharynx;Esophageal backflow into cervical esophagus Thin Straw Esophageal backflow into the pharynx;Esophageal backflow into cervical esophagus Puree [facility] Mechanical Soft WFL Regular NT Multi-consistency NT Pill NT Bethany A Lutes 01/22/2022, 1:35 PM                     Recent Labs    01/22/22 0619  WBC 5.7  HGB 11.8*  HCT 35.7*  PLT 346    Recent Labs    01/22/22 0619 01/23/22 0508  NA 139 137  K 3.4* 3.9  CL 102 102  CO2 28 26  GLUCOSE 96 99  BUN 22 23  CREATININE 1.15 1.29*  CALCIUM 8.7* 8.6*     Intake/Output Summary (Last 24 hours) at 01/23/2022 1219 Last data filed at 01/23/2022 1610 Gross per 24 hour  Intake 598 ml  Output 200 ml  Net 398 ml      Pressure Injury 01/11/22 Coccyx Medial Deep Tissue Pressure Injury - Purple or maroon localized area of discolored intact skin or blood-filled blister due to damage of underlying soft tissue from pressure and/or shear. DTI sacrum (Active)  01/11/22 2235  Location: Coccyx  Location Orientation: Medial  Staging: Deep Tissue Pressure Injury - Purple or maroon localized area of discolored intact skin or blood-filled blister due to damage of underlying soft tissue from pressure and/or shear.  Wound Description (Comments): DTI sacrum  Present on Admission: Yes    Physical Exam: Vital Signs Blood pressure 139/76, pulse 95, temperature 97.7 F (36.5 C), temperature source Oral, resp. rate 17, height 6' (1.829 m), weight 83.7 kg, SpO2 98 %.  Constitutional: No distress . Vital signs reviewed. Frail appearing HEENT: NCAT, EOMI, oral membranes moist Neck: supple Cardiovascular: RRR without murmur. No JVD    Respiratory/Chest: CTA Bilaterally without wheezes or rales. Normal effort    GI/Abdomen: BS +, non-tender, non-distended Ext: no clubbing, cyanosis, or edema Psych: pleasant and  cooperative  Skin: sacral wound not visualized today Neurologic: Cranial nerves II through XII intact, motor strength is 4/5 in bilateral deltoid, bicep, tricep, grip, hip flexor, knee extensors, ankle dorsiflexor and plantar flexor Sensory exam normal sensation to light touch and proprioception in bilateral upper and lower extremities Cerebellar exam  normal finger to nose to finger as well as heel to shin in bilateral upper and lower extremities Musculoskeletal: Full range of motion in all 4 extremities. No joint swelling. Neck and shoulder discomfort in sitting position, head forward posture    Assessment/Plan: 1. Functional deficits which require 3+ hours per day of interdisciplinary therapy in a comprehensive inpatient rehab setting. Physiatrist is providing close team supervision and 24 hour management of active medical problems listed below. Physiatrist and rehab team continue to assess barriers to discharge/monitor patient progress toward functional and medical goals  Care Tool:  Bathing    Body parts bathed by patient: Right arm, Left arm, Chest, Abdomen, Front perineal area, Buttocks, Right upper leg, Left upper leg, Face   Body parts bathed by helper: Right lower leg, Left lower leg     Bathing assist Assist Level: Minimal Assistance - Patient > 75%     Upper Body Dressing/Undressing Upper body dressing   What is the patient wearing?: Pull over shirt    Upper body assist Assist Level: Supervision/Verbal cueing    Lower Body Dressing/Undressing Lower body dressing      What is the patient wearing?: Pants, Incontinence brief     Lower body assist Assist for lower body dressing: Moderate Assistance - Patient 50 - 74%     Toileting Toileting Toileting Activity did not occur Press photographer and hygiene only): N/A (no void or bm)  Toileting assist Assist for toileting: Minimal Assistance - Patient > 75%     Transfers Chair/bed transfer  Transfers assist   Chair/bed transfer activity did not occur: Safety/medical concerns  Chair/bed transfer assist level: Contact Guard/Touching assist     Locomotion Ambulation   Ambulation assist   Ambulation activity did not occur: Safety/medical concerns (Unable to perform ambulation and stair mobility secondary to poor endurance/activity tolerance and global weakness.)          Walk 10 feet activity   Assist  Walk 10 feet activity did not occur: Safety/medical concerns        Walk 50 feet activity   Assist Walk 50 feet with 2 turns activity did not occur: Safety/medical concerns         Walk 150 feet activity   Assist Walk 150 feet activity did not occur: Safety/medical concerns         Walk 10 feet on uneven surface  activity   Assist Walk 10 feet on uneven surfaces activity did not occur: Safety/medical concerns         Wheelchair     Assist Is the patient using a wheelchair?: Yes Type of Wheelchair: Manual    Wheelchair assist level: Total Assistance - Patient < 25% Max wheelchair distance: Patient able to propel ~20' and then required total assistance for the remaining 130'    Wheelchair 50 feet with 2 turns activity    Assist        Assist Level: Maximal Assistance - Patient 25 - 49%   Wheelchair 150 feet activity     Assist      Assist Level: Total Assistance - Patient < 25%   Blood pressure 139/76, pulse 95, temperature 97.7 F (36.5 C), temperature source Oral, resp. rate 17, height 6' (1.829 m), weight 83.7 kg, SpO2 98 %.      Medical Problem List and Plan: 1. Functional deficits secondary to pulmonary debility             -patient may shower             -  ELOS/Goals: 10-14 days             -Continue CIR  -Will consult SLP due to coughing with drinking water for swallow eval 2.  Antithrombotics: -DVT/anticoagulation:  Mechanical: Sequential compression devices, below knee Bilateral lower extremities -Eliquis started by  cardiology             -antiplatelet therapy: N/A 3. L shoulder pain: add voltaren gel prn. Oxycodone prn.  4. Mood/Behavior/Sleep: LCSW to follow for evaluation and support.              -antipsychotic agents: N/A 5. Neuropsych/cognition: This patient is capable of making decisions on his own behalf. 6. Skin/Wound Care: Routine pressure relief measures.              --foam dressing to DTI w/chronic tissue damage.              --continue nutritional supplements.  7. Fluids/Electrolytes/Nutrition: Monitor I/O. Check CMET in am             --continue nutritional supplements. 8. Left hemothorax: has resolved. Pulmonary hygiene.             --follow up with Pulmonary in 5 weeks w/CxR. 9. A fib: Monitor HR TID--continue Metoprolol BID  -appears to be mildly tachycardic chronically, continue to monitor, consider increase dose metoprolol  -Afib with RVR noted, Cardiology started metoprolol XL 100mg  Daily(can reduce to 50ml XL if BP dont tolerate), Eliquis 5mg  started by cardiology, appreciate cardiology assistance  -8/29 HR stable in 90s , continue to monitor 10. ABLA: Stable overall. Recheck CBC in am.             --h/o hemochromatosis.       Latest Ref Rng & Units 01/22/2022    6:19 AM 01/19/2022    6:21 AM 01/18/2022    5:29 AM  CBC  WBC 4.0 - 10.5 K/uL 5.7  8.8  9.0   Hemoglobin 13.0 - 17.0 g/dL 16.111.8  09.611.0  04.510.5   Hematocrit 39.0 - 52.0 % 35.7  32.4  30.6   Platelets 150 - 400 K/uL 346  233  214    Slowly improving , normochromic normocytic  11. H/o depression w/anxiety: Continue Xanax 0.5 mg bid w/ Seroquel 25 mg/HS.  -8/26 Seroquel was decreased to 12.5mg /HS due to sedation 12. H/o Seizure d/o: Continue Keppra bid.  13. Acute on chronic renal failure: Baseline SCr 1.27-1.3. Lasix on hold.                 Latest Ref Rng & Units 01/23/2022    5:08 AM 01/22/2022    6:19 AM 01/18/2022    5:29 AM  BMP  Glucose 70 - 99 mg/dL 99  96  99   BUN 8 - 23 mg/dL 23  22  21    Creatinine 0.61 -  1.24 mg/dL 4.091.29  8.111.15  9.141.35   Sodium 135 - 145 mmol/L 137  139  135   Potassium 3.5 - 5.1 mmol/L 3.9  3.4  3.6   Chloride 98 - 111 mmol/L 102  102  101   CO2 22 - 32 mmol/L 26  28  25    Calcium 8.9 - 10.3 mg/dL 8.6  8.7  8.4    Fairly stable Cr although slight incr 8/29.  may resume Lasix if needed .  No LE edema. Chest generally clear with decr sounds at bases 14. Airspace disease on CXR: incentive spirometer ordered. 15. Pleural effusion: daily weights ordered to monitor  for fluid overload while off of Lasix.   -Weight stable Filed Weights   01/21/22 0504 01/22/22 0356 01/23/22 0458  Weight: 83.2 kg 80 kg 83.7 kg      17. Fatigue: ordered iron, B12, and folate with morning labs 18. Neck pain, suspect from increased use with therapy  -re-ordered kpad today, discussed posture.   19.  HypoK+ mild, likely nutritional , no diuretics  oral supplement x 1 - -K+ 3.9 today -recheck labs Friday  20.  GERD- cont PPI, up in chair for meals  21. Dysphagia:  8/29 -MBS performed yesterday by SLP which demonstrated mild oropharyngeal dysphagia. Also appears to have esophageal component (retrograde bolus flow thru cervical esophagus).   -diet adjusted to D2/thins, meds crushed or whole in puree  -slp to follow and monitor for progress as he gets stronger.   -might benefit from GI assessment given esophageal component. Pt does not want to pursue at this point however.     LOS: 6 days A FACE TO FACE EVALUATION WAS PERFORMED  Ranelle Oyster 01/23/2022, 12:19 PM

## 2022-01-23 NOTE — Progress Notes (Addendum)
Speech Language Pathology Daily Session Note  Patient Details  Name: Eugene [name] MRN: [id number] Date of Birth: 1936-01-15  Today's Date: 01/23/2022 SLP Individual Time: 0730-0810 SLP Individual Time Calculation (min): 40 min  Short Term Goals: Week 1: SLP Short Term Goal 1 (Week 1): Pt will participate in MBSS to determine least restrictive diet with 100% completion. - GOAL MET SLP Short Term Goal 1 - Progress (Week 1): Met  SLP Short Term Goal 2 (Week 1): Pt will consume least restrictive diet with minimal s/sx concerning for airway invasion with stable vitals and medical status given Sup A for implementation of aspiration precautions. - Pt self-administered Dysphagia 3 textures and thin liquids via cup with Set-Up A. During intake, minimal s/sx concerning for aspiration observed. Intermittent throat clearing noted post-swallow with vocal quality noted to be dry after throat clear and re-swallow. These findings appear consistent with MBSS results on 8/28, and appear to be most r/t findings of retrograde back flow on fluoro. Significant, immediate cough noted with mixed consistency - regular and thin liquid x 1 due to rapid rate and large bites. Reinforced observation c/f aspiration and need for slow rate, small + single bites/sips, and alternating bites/sips.  Skilled Therapeutic Interventions: S: Pt seen this date for skilled ST intervention targeting deglutition goals outlined above. Pt received awake/alert and stting semi-upright in bed, eating breakfast with NT present for supervision. To improve safety with PO intake, SLP provided repositioning with hospital bed features to place pt at a more upright angle. Removed straw from pt's cup and provided re-education on recommendations for no straws at this time. Agreeable to ST intervention at bedside. LPN secure chatted SLP to state that pt tolerated dinner tray of Dysphagia 2 textures + thin liquids with no difficulty.  O: Please see above  for objective data re: pt's performance on targeted goals. SLP provided skilled education re: MBSS results, aspiration and reflux precautions, handling of mixed consistencies, and importance of adherence to precautions. This date, pt benefited from overall Min A faded to Sup A verbal cues to implement aspiration and reflux precautions during therapeutic trials of Dysphagia 3 textures. Pt receptive to education and amenable to education and implementation of recommendations. Per chart review, VSS, WBC WNL, and "respiratory/chest: CTA Bilaterally without wheezes or rales. Normal effort" per most recent MD note.  A: Pt appears sitmulable for skilled ST intervention as evident by improvement in tolerance of thin liquids via cup and soft solid textures with Min A, faded to Sup A verbal cues, for adherence to aspiration and reflux precautions. Given functional oral phase which would support upgraded textures, recommend upgrade to Dysphagia 3 consistencies with continuation of thin liquids via cup (NO STRAWS) and strict adherence to aspiration + reflux precautions. Pt verbalized understanding of precautions via teach back given overall Min A verbal cues. Will plan to continue monitoring for tolerance via clinical presentation, pt/family/staff reports, and chart review.   P: Pt left fully upright in bed with all safety measures activated. Call bell reviewed and within reach and all immediate needs met. Continue per current ST POC next session.  Pain None/Denies   Therapy/Group: Individual Therapy  Tabari Volkert A Laresha Bacorn 01/23/2022, 12:40 PM

## 2022-01-23 NOTE — Plan of Care (Signed)
LB dressing downgraded to min A to reflect pt baseline help from caregivers. Transfers upgraded to (S) d/t pt progress.   Problem: Sit to Stand Goal: LTG:  Patient will perform sit to stand in prep for activites of daily living with assistance level (OT) Description: LTG:  Patient will perform sit to stand in prep for activites of daily living with assistance level (OT) Flowsheets (Taken 01/23/2022 1043) LTG: PT will perform sit to stand in prep for activites of daily living with assistance level: (upgraded 2/2 pt progress- SD) Supervision/Verbal cueing   Problem: RH Toilet Transfers Goal: LTG Patient will perform toilet transfers w/assist (OT) Description: LTG: Patient will perform toilet transfers with assist, with/without cues using equipment (OT) Flowsheets (Taken 01/23/2022 1043) LTG: Pt will perform toilet transfers with assistance level of: (upgraded 2/2 pt progress- SD) Supervision/Verbal cueing

## 2022-01-23 NOTE — Plan of Care (Signed)
Upgraded goals due to patient progress. S.R, PT, DPT  Problem: RH Bed Mobility Goal: LTG Patient will perform bed mobility with assist (PT) Description: LTG: Patient will perform bed mobility with assistance, with/without cues (PT). Flowsheets (Taken 01/23/2022 1528) LTG: Pt will perform bed mobility with assistance level of: Supervision/Verbal cueing   Problem: RH Bed to Chair Transfers Goal: LTG Patient will perform bed/chair transfers w/assist (PT) Description: LTG: Patient will perform bed to chair transfers with assistance (PT). Flowsheets (Taken 01/23/2022 1528) LTG: Pt will perform Bed to Chair Transfers with assistance level: Supervision/Verbal cueing   Problem: RH Car Transfers Goal: LTG Patient will perform car transfers with assist (PT) Description: LTG: Patient will perform car transfers with assistance (PT). Flowsheets (Taken 01/23/2022 1528) LTG: Pt will perform car transfers with assist:: Supervision/Verbal cueing   Problem: RH Floor Transfers Goal: LTG Patient will perform floor transfers w/assist (PT) Description: LTG: Patient will perform floor transfers with assistance (PT). Flowsheets (Taken 01/23/2022 1528) LTG: PT WILL PERFORM FLOOR TRANFERS  WITH  ASSIST:: Supervision/Verbal cueing   Problem: RH Ambulation Goal: LTG Patient will ambulate in controlled environment (PT) Description: LTG: Patient will ambulate in a controlled environment, # of feet with assistance (PT). Flowsheets (Taken 01/23/2022 1528) LTG: Pt will ambulate in controlled environ  assist needed:: Supervision/Verbal cueing Goal: LTG Patient will ambulate in home environment (PT) Description: LTG: Patient will ambulate in home environment, # of feet with assistance (PT). Flowsheets (Taken 01/23/2022 1528) LTG: Pt will ambulate in home environ  assist needed:: Supervision/Verbal cueing Goal: LTG Patient will ambulate in community environment (PT) Description: LTG: Patient will ambulate in community  environment, # of feet with assistance (PT). Flowsheets (Taken 01/23/2022 1528) LTG: Pt will ambulate in community environ  assist needed:: Supervision/Verbal cueing LTG: Ambulation distance in community environment: 150   Problem: RH Stairs Goal: LTG Patient will ambulate up and down stairs w/assist (PT) Description: LTG: Patient will ambulate up and down # of stairs with assistance (PT) Flowsheets (Taken 01/23/2022 1528) LTG: Pt will ambulate up/down stairs assist needed:: Contact Guard/Touching assist LTG: Pt will  ambulate up and down number of stairs: 4

## 2022-01-23 NOTE — Progress Notes (Signed)
Occupational Therapy Session Note  Patient Details  Name: Eugene [name] MRN: [id number] Date of Birth: May 16, 1936  Today's Date: 01/23/2022 OT Individual Time: 8110-3159 OT Individual Time Calculation (min): 45 min  and Today's Date: 01/23/2022 OT Missed Time: 15 Minutes Missed Time Reason: Patient fatigue   Short Term Goals: Week 1:  OT Short Term Goal 1 (Week 1): Patient will complete sit<>stand with mod A in preparation for BADL tasks OT Short Term Goal 2 (Week 1): Patient will tolerate standing at the sink for 1 minute during BADL task. OT Short Term Goal 3 (Week 1): Patient will complete toilet transfer with mod A.  Skilled Therapeutic Interventions/Progress Updates:    Pt received in w/c with no c/o pain but reporting fatigue. He was resistant to taking a shower but per conversation with his daughter yesterday he requires encouragement for showers at baseline and it has been over a week since pt has bathed. He completed functional mobility into the bathroom with the RW with CGA overall. He initiated shower transfer and then reported sudden urge to have BM. He was slightly incontinent and then voided the rest in the toilet. He required min A for toileting hygiene. He transferred into the shower with (S). UB Bathing with (S), min A for LB d/t fatigue. Edu provided re energy conservation strategies at home. He transferred back to EOB with (S). (S) to don shirt, mod A to don pants. He declined any participation in final 15 min of session d/t fatigue. He transferred back to supine in bed with min A for LE management. He was left supine with all needs met.   Therapy Documentation Precautions:  Precautions Precautions: Fall Precaution Comments: Rib fractures on L side Restrictions Weight Bearing Restrictions: No  Therapy/Group: Individual Therapy  Curtis Sites 01/23/2022, 6:55 AM

## 2022-01-24 DIAGNOSIS — R5381 Other malaise: Secondary | ICD-10-CM | POA: Diagnosis not present

## 2022-01-24 MED ORDER — PANTOPRAZOLE SODIUM 40 MG PO TBEC
40.0000 mg | DELAYED_RELEASE_TABLET | Freq: Two times a day (BID) | ORAL | Status: DC
  Administered 2022-01-24 – 2022-01-26 (×4): 40 mg via ORAL
  Filled 2022-01-24 (×4): qty 1

## 2022-01-24 NOTE — Progress Notes (Signed)
Patient ID: Eugene [name], male   DOB: 09-19-1935, 86 y.o.   MRN: [id number]  Met with pt and spoke with daughter-Eugene [name] via telephone to inform of team conference goals of supervision-CGA level and target discharge date of 9/1. Both are pleased with his progress here and feel he will be ready to go ome on Friday. Discussed needs 3 in 1 and resuming [facility] for follow up therapies. Will work on discharge needs for Friday.

## 2022-01-24 NOTE — Progress Notes (Signed)
Occupational Therapy Session Note  Patient Details  Name: Eugene [name] MRN: [id number] Date of Birth: January 19, 1936  Session 1  Today's Date: 01/24/2022 OT Individual Time: 1100-1130 OT Individual Time Calculation (min): 30 min  and Today's Date: 01/24/2022 OT Missed Time: 15 Minutes Missed Time Reason: Unavailable (comment) (OT unavailable from prior session)   Session 2  Today's Date: 01/24/2022 OT Individual Time: 1400-1420 OT Individual Time Calculation (min): 20 min  and Today's Date: 01/24/2022 OT Missed Time: 10 Minutes Missed Time Reason: Patient fatigue  Short Term Goals: Week 1:  OT Short Term Goal 1 (Week 1): Patient will complete sit<>stand with mod A in preparation for BADL tasks OT Short Term Goal 2 (Week 1): Patient will tolerate standing at the sink for 1 minute during BADL task. OT Short Term Goal 3 (Week 1): Patient will complete toilet transfer with mod A.  Skilled Therapeutic Interventions/Progress Updates:    Session 1 Pt received sitting in the w/c with no c/o pain agreeable to OT session. He declined need for ADLs. He was taken to therapy gym via w/c. He transferred to the mat with (S) overall using the RW. He completed dynamic standing balance activity, standing on an airex pad while simultaneously completing BUE bicep curls and chest press using a 5lb dowel. Activity performed to challenge ankle righting reactions with UE strengthening. He required consistent min A for posterior support, occasionally mod A. He completed 100 ft of functional mobility back to his room with the RW at (S) level. He returned to his room and was left supine with all needs met.    Session 2 Pt received sitting EOB with NT present, ready to begin transfer to the bathroom. He used the RW to stand from EOB with CGA. He completed an ambulatory transfer with (S) overall into the bathroom. Min cueing for RW positioning. Pt voided urine in standing with close (S). He returned to the w/c. OT  gave demonstration on use of a reacher during LB dressing. Pt declined attempting and declined any further intervention, stating "I'm just too tired". Pt left sitting up in the w/c with all needs met, chair alarm set. 10 min missed.    Therapy Documentation Precautions:  Precautions Precautions: Fall Precaution Comments: Rib fractures on L side Restrictions Weight Bearing Restrictions: No  Therapy/Group: Individual Therapy  Curtis Sites 01/24/2022, 6:07 AM

## 2022-01-24 NOTE — Progress Notes (Signed)
PROGRESS NOTE   Subjective/Complaints: No new complaints this morning Eager to go home Team says he is doing great and will be functionally ready for d/c on Friday  Review of Systems  Constitutional:  Negative for chills, fever and weight loss.  Eyes:  Negative for double vision.  Respiratory:  Negative for cough.   Cardiovascular:  Negative for chest pain and palpitations.  Gastrointestinal: Negative.  Negative for abdominal pain and constipation.  Genitourinary: Negative.   Neurological:  Positive for weakness.    Objective:   No results found. Recent Labs    01/22/22 0619  WBC 5.7  HGB 11.8*  HCT 35.7*  PLT 346    Recent Labs    01/22/22 0619 01/23/22 0508  NA 139 137  K 3.4* 3.9  CL 102 102  CO2 28 26  GLUCOSE 96 99  BUN 22 23  CREATININE 1.15 1.29*  CALCIUM 8.7* 8.6*     Intake/Output Summary (Last 24 hours) at 01/24/2022 1205 Last data filed at 01/24/2022 0944 Gross per 24 hour  Intake 710 ml  Output 150 ml  Net 560 ml      Pressure Injury 01/11/22 Coccyx Medial Deep Tissue Pressure Injury - Purple or maroon localized area of discolored intact skin or blood-filled blister due to damage of underlying soft tissue from pressure and/or shear. DTI sacrum (Active)  01/11/22 2235  Location: Coccyx  Location Orientation: Medial  Staging: Deep Tissue Pressure Injury - Purple or maroon localized area of discolored intact skin or blood-filled blister due to damage of underlying soft tissue from pressure and/or shear.  Wound Description (Comments): DTI sacrum  Present on Admission: Yes    Physical Exam: Vital Signs Blood pressure 137/89, pulse 94, temperature 97.7 F (36.5 C), resp. rate 18, height 6' (1.829 m), weight 82 kg, SpO2 96 %.  Constitutional: No distress . Vital signs reviewed. Frail appearing, working with OT in gym HEENT: NCAT, EOMI, oral membranes moist Neck: supple Cardiovascular:  RRR without murmur. No JVD    Respiratory/Chest: CTA Bilaterally without wheezes or rales. Normal effort    GI/Abdomen: BS +, non-tender, non-distended Ext: no clubbing, cyanosis, or edema Psych: pleasant and cooperative  Skin: sacral wound not visualized today Neurologic: Cranial nerves II through XII intact, motor strength is 4/5 in bilateral deltoid, bicep, tricep, grip, hip flexor, knee extensors, ankle dorsiflexor and plantar flexor Sensory exam normal sensation to light touch and proprioception in bilateral upper and lower extremities Cerebellar exam normal finger to nose to finger as well as heel to shin in bilateral upper and lower extremities Musculoskeletal: Full range of motion in all 4 extremities. No joint swelling. Neck and shoulder discomfort in sitting position, head forward posture    Assessment/Plan: 1. Functional deficits which require 3+ hours per day of interdisciplinary therapy in a comprehensive inpatient rehab setting. Physiatrist is providing close team supervision and 24 hour management of active medical problems listed below. Physiatrist and rehab team continue to assess barriers to discharge/monitor patient progress toward functional and medical goals  Care Tool:  Bathing    Body parts bathed by patient: Right arm, Left arm, Chest, Abdomen, Front perineal area, Buttocks, Right upper leg,  Left upper leg, Face   Body parts bathed by helper: Right lower leg, Left lower leg     Bathing assist Assist Level: Minimal Assistance - Patient > 75%     Upper Body Dressing/Undressing Upper body dressing   What is the patient wearing?: Pull over shirt    Upper body assist Assist Level: Supervision/Verbal cueing    Lower Body Dressing/Undressing Lower body dressing      What is the patient wearing?: Pants, Incontinence brief     Lower body assist Assist for lower body dressing: Moderate Assistance - Patient 50 - 74%     Toileting Toileting Toileting  Activity did not occur Press photographer and hygiene only): N/A (no void or bm)  Toileting assist Assist for toileting: Minimal Assistance - Patient > 75%     Transfers Chair/bed transfer  Transfers assist  Chair/bed transfer activity did not occur: Safety/medical concerns  Chair/bed transfer assist level: Contact Guard/Touching assist     Locomotion Ambulation   Ambulation assist   Ambulation activity did not occur: Safety/medical concerns (Unable to perform ambulation and stair mobility secondary to poor endurance/activity tolerance and global weakness.)          Walk 10 feet activity   Assist  Walk 10 feet activity did not occur: Safety/medical concerns        Walk 50 feet activity   Assist Walk 50 feet with 2 turns activity did not occur: Safety/medical concerns         Walk 150 feet activity   Assist Walk 150 feet activity did not occur: Safety/medical concerns         Walk 10 feet on uneven surface  activity   Assist Walk 10 feet on uneven surfaces activity did not occur: Safety/medical concerns         Wheelchair     Assist Is the patient using a wheelchair?: Yes Type of Wheelchair: Manual    Wheelchair assist level: Total Assistance - Patient < 25% Max wheelchair distance: Patient able to propel ~20' and then required total assistance for the remaining 130'    Wheelchair 50 feet with 2 turns activity    Assist        Assist Level: Maximal Assistance - Patient 25 - 49%   Wheelchair 150 feet activity     Assist      Assist Level: Total Assistance - Patient < 25%   Blood pressure 137/89, pulse 94, temperature 97.7 F (36.5 C), resp. rate 18, height 6' (1.829 m), weight 82 kg, SpO2 96 %.      Medical Problem List and Plan: 1. Functional deficits secondary to pulmonary debility             -patient may shower             -ELOS/Goals: 10-14 days             -Continue CIR  -Will consult SLP due to coughing  with drinking water for swallow eval  -Interdisciplinary Team Conference today   2.  Antithrombotics: -DVT/anticoagulation:  Mechanical: Sequential compression devices, below knee Bilateral lower extremities -Eliquis started by cardiology             -antiplatelet therapy: N/A 3. L shoulder pain: add voltaren gel prn. Oxycodone prn.  4. Mood/Behavior/Sleep: LCSW to follow for evaluation and support.              -antipsychotic agents: N/A 5. Neuropsych/cognition: This patient is capable of making decisions on his own  behalf. 6. Skin/Wound Care: Routine pressure relief measures.              --foam dressing to DTI w/chronic tissue damage.              --continue nutritional supplements.  7. Fluids/Electrolytes/Nutrition: Monitor I/O. Check CMET in am             --continue nutritional supplements. 8. Left hemothorax: has resolved. Pulmonary hygiene.             --follow up with Pulmonary in 5 weeks w/CxR. 9. A fib: Monitor HR TID--continue Metoprolol BID  -appears to be mildly tachycardic chronically, continue to monitor, consider increase dose metoprolol  -Afib with RVR noted, Cardiology started metoprolol XL 100mg  Daily(can reduce to 6ml XL if BP dont tolerate), Eliquis 5mg  started by cardiology, appreciate cardiology assistance  -8/29 HR stable in 90s , continue to monitor 10. ABLA: Stable overall. Recheck CBC in am.             --h/o hemochromatosis.       Latest Ref Rng & Units 01/22/2022    6:19 AM 01/19/2022    6:21 AM 01/18/2022    5:29 AM  CBC  WBC 4.0 - 10.5 K/uL 5.7  8.8  9.0   Hemoglobin 13.0 - 17.0 g/dL 11.8  11.0  10.5   Hematocrit 39.0 - 52.0 % 35.7  32.4  30.6   Platelets 150 - 400 K/uL 346  233  214    Slowly improving , normochromic normocytic  11. H/o depression w/anxiety: Continue Xanax 0.5 mg bid w/ Seroquel 25 mg/HS.  -8/26 Seroquel was decreased to 12.5mg /HS due to sedation 12. H/o Seizure d/o: Continue Keppra bid.  13. Acute on chronic renal failure:  Baseline SCr 1.27-1.3. Lasix on hold.                 Latest Ref Rng & Units 01/23/2022    5:08 AM 01/22/2022    6:19 AM 01/18/2022    5:29 AM  BMP  Glucose 70 - 99 mg/dL 99  96  99   BUN 8 - 23 mg/dL 23  22  21    Creatinine 0.61 - 1.24 mg/dL 1.29  1.15  1.35   Sodium 135 - 145 mmol/L 137  139  135   Potassium 3.5 - 5.1 mmol/L 3.9  3.4  3.6   Chloride 98 - 111 mmol/L 102  102  101   CO2 22 - 32 mmol/L 26  28  25    Calcium 8.9 - 10.3 mg/dL 8.6  8.7  8.4    Fairly stable Cr although slight incr 8/29.  may resume Lasix if needed .  No LE edema. Chest generally clear with decr sounds at bases  Repeat BMP tomorrow 14. Airspace disease on CXR: incentive spirometer ordered. 15. Pleural effusion: daily weights ordered to monitor for fluid overload while off of Lasix.   -Weight stable Filed Weights   01/22/22 0356 01/23/22 0458 01/24/22 0427  Weight: 80 kg 83.7 kg 82 kg      17. Fatigue: ordered iron, B12, and folate with morning labs 18. Neck pain, suspect from increased use with therapy  -re-ordered kpad today, discussed posture.   19.  HypoK+ mild, likely nutritional , no diuretics  oral supplement x 1 - -K+ 3.9 today -recheck labs Friday  20.  GERD- increase protonix to 40mg  BID, up in chair for meals  21. Dysphagia:  8/29 -MBS performed yesterday by SLP which demonstrated  mild oropharyngeal dysphagia. Also appears to have esophageal component (retrograde bolus flow thru cervical esophagus).   -diet adjusted to D2/thins, meds crushed or whole in puree  -slp to follow and monitor for progress as he gets stronger.   -might benefit from GI assessment given esophageal component. Pt does not want to pursue at this point however.   Increase Protonix to 40mg  BID    LOS: 7 days A FACE TO FACE EVALUATION WAS PERFORMED  Eugene [name] 01/24/2022, 12:05 PM

## 2022-01-24 NOTE — Patient Care Conference (Addendum)
Inpatient RehabilitationTeam Conference and Plan of Care Update Date: 01/24/2022   Time: 12:03 PM    Patient Name: Eugene [name]      Medical Record Number: [id number]  Date of Birth: 1935/09/05 Sex: Male         Room/Bed: 4W10C/4W10C-01 Payor Info: Payor: MEDICARE / Plan: MEDICARE PART A AND B / Product Type: *No Product type* /    Admit Date/Time:  01/17/2022  5:36 PM  Primary Diagnosis:  Debility  Hospital Problems: Principal Problem:   Debility Active Problems:   Atrial fibrillation (HCC)   Depressive reaction   Fever, unspecified   Neck pain    Expected Discharge Date: Expected Discharge Date: 01/26/22  Team Members Present: Physician leading conference: Dr. Sula Soda Social Worker Present: Dossie Der, LCSW Nurse Present: Chana Bode, RN PT Present: Other (comment) (sydney, PT) OT Present: Jake Shark, OT SLP Present: Feliberto Gottron, SLP PPS Coordinator present : Fae Pippin, SLP     Current Status/Progress Goal Weekly Team Focus  Bowel/Bladder   Continent/incontinent. Last BM 8/29  Remain continent.  Toilet patient q2-3 hours.   Swallow/Nutrition/ Hydration   upgraded to dys 3 textures on 8/29. tolerating thin liquids via cup (NO STRAW) min A fade to supervision A adherence to aspiration and reflux precuations. reduced to x1-3 a week  Sup A  Tolerance of current diet. Advancement to regular textures   ADL's   CGA- (S) transfer ,mod A LB dressing, min A toileting. Limited activity tolerance/enduance  (S)- CGA overall  Generalized activity tolerance, transfers, ADLs, d/c planning   Mobility   CGA/SBA for sit/stand, transfers and gait up to 150' with the use of a RW  Supervision/CGA with all transfers and gait  Endurance/activity tolerance, dynamic stability, gait   Communication   N/A - at baseline  N/A  N/A   Safety/Cognition/ Behavioral Observations  N/A - at baseline  N/A  N/A   Pain   Denies pain.  Remain pain free.  Monitor for pain q  shift and prn.   Skin   CDI  No skin breakdown while in rehab.  Assess skin q shift prn.     Discharge Planning:  HOme with 24/7 caregiver-wife has been placed in [facility] facility. Pt is doing well in therapies. Daughter is here daily and very invovled   Team Discussion: Patient doing well overall; fatigued. Continue to note wet vocal quality on D3/thin diet with hx. Of reflux; MD adjusting meds.  Patient on target to meet rehab goals: Currently, needs mod assist for lower body dressing and min assist for toileting. Completes sit - stand with CGA and able to ambulate up to 150' with a RW and supervision for occasional posterior loss of balance. Goals for discharge set for supervision - CGA and supervision for gait.  *See Care Plan and progress notes for long and short-term goals.   Revisions to Treatment Plan:  Downgraded lower body care goal to min assist   Teaching Needs: Safety, medications, transfes, toileting, etc  Current Barriers to Discharge: Decreased caregiver support  Possible Resolutions to Barriers: Family education DME: 3N1/[facility] HH follow up services     Medical Summary Current Status: AKI, pain  Barriers to Discharge: Medical stability  Barriers to Discharge Comments: AKI, pain Possible Resolutions to Barriers/Weekly Focus: continue to hold Lasix since weights are down, placed nursing order to encourage 6-8 glasses of water per day, continue scheduled tylenol QID   Continued Need for Acute Rehabilitation Level of Care: The patient  requires daily medical management by a physician with specialized training in physical medicine and rehabilitation for the following reasons: Direction of a multidisciplinary physical rehabilitation program to maximize functional independence : Yes Medical management of patient stability for increased activity during participation in an intensive rehabilitation regime.: Yes Analysis of laboratory values and/or radiology reports with  any subsequent need for medication adjustment and/or medical intervention. : Yes   I attest that I was present, lead the team conference, and concur with the assessment and plan of the team.   Pamelia Hoit 01/24/2022, 3:44 PM

## 2022-01-24 NOTE — Progress Notes (Signed)
Speech Language Pathology Daily Session Note  Patient Details  Name: Eugene [name] MRN: [id number] Date of Birth: 04/03/1936  Today's Date: 01/24/2022 SLP Individual Time: 2423-5361 SLP Individual Time Calculation (min): 59 min  Short Term Goals: Week 1: SLP Short Term Goal 1 (Week 1): Pt will participate in MBSS to determine least restrictive diet with 100% completion. SLP Short Term Goal 1 - Progress (Week 1): Met SLP Short Term Goal 2 (Week 1): Pt will consume least restrictive diet with minimal s/sx concerning for airway invasion with stable vitals and medical status given Sup A for implementation of aspiration precautions.  Skilled Therapeutic Interventions:Skilled ST services focused on swallow skills. Patient had recently consumed dys 3 textures and thin liquid breakfast tray. Patient reported no difficulty with swallowing, except for poor positioning in bed. Patient was not in an ideal position with pillows, forcing his neck and head anteriorly. SLP reposition patient in bed at 90. Patient recalled swallow strategies, mod I. Patient supported the need to be sitting in chair versus in air mattress bed during consumption of meals, SLP updated signs and orders to reflect this. SLP transfer patient to wheelchair, where patient consumed regular textured snacks, and then liquids with supervision A verbal cues for swallow strategies. Pt demonstrated intermittent throat clear with and without PO intake, possibly due to reflux. SLP educated pt on masako exercise to strength BOT and increase pressure to open UES as well as reduce residue. Pt completed x5 and was instructed (handout given) to complete a set of 5, 5 times per day. All questions answered to satisfaction. SLP recommends regular trial tray to assess advancement of solids. Pt was left in room with call bell within reach and chair alarm set. SLP recommends to continue skilled services.     Pain Pain Assessment Pain Score: 0-No  pain  Therapy/Group: Individual Therapy  Izmael Duross 01/24/2022, 2:26 PM

## 2022-01-24 NOTE — Progress Notes (Signed)
Physical Therapy Session Note  Patient Details  Name: Eugene [name] MRN: [id number] Date of Birth: Jul 29, 1935  Today's Date: 01/24/2022 PT Individual Time: 0915-1015 PT Individual Time Calculation (min): 60 min   Short Term Goals: Week 1:  PT Short Term Goal 1 (Week 1): Patient will complete bed mobility with moderate assistance PT Short Term Goal 2 (Week 1): Patient will perform sit/stand with RW and moderate assistance PT Short Term Goal 3 (Week 1): Patient will perform stand pivot transfer with RW and moderate assistance  Skilled Therapeutic Interventions/Progress Updates:  Patient greeted sitting upright in wheelchair in room and agreeable to PT treatment session. Patient initiated gait trial in rehab gym, however upon standing reported he needed to use the restroom- Patient ambulated to/from the bathroom in rehab gym with RW and supervision for safety. Patient was able to manage pants/brief and transition from standing to sitting with the use of the grab bar and CGA. Patient required Min/ModA for standing from the low seat height of the toilet. TotalA for pericare as patient reported he "can either balance or wipe, but not both."  Patient gait trained x150' with RW and supervision for safety. Decreased cadence overall with decreased B step length, however no LOB noted.    Patient performed alternating toe taps to 6" stool with RW and CGA- Patient demonstrated good stability throughout activity with no LOB noted.  Patient performed alternating toe taps to 6" stool with L HHA, 3 x 20 total with MinA for improved stability. Increased difficulty noted when balancing on L LE.    Sit/stand without RW and SBA with notable posterior lean upon standing- Emphasis on increased anterior weight shift, scooting forward prior to standing and tucking B LE underneath him. Patient demonstrated improved mechanics with increased repetition, however unable to sustain as he fatigues or without cues-  Patient is able to verbalize appropriate sequencing required to stand successfully, but inconsistent with execution.    5xSTS with RW and SBA-  53.19 seconds  60.01 seconds 47.04 seconds Patient with poor recall of proper sequencing and notable posterior lean requiring MAX verbal cues.    Patient required extended seated rest breaks after each activity secondary to reports of significant fatigue with poor endurance.    Patient returned to his room sitting upright in wheelchair with posey belt on, call bell within reach and all needs met.    Therapy Documentation Precautions:  Precautions Precautions: Fall Precaution Comments: Rib fractures on L side Restrictions Weight Bearing Restrictions: No   Therapy/Group: Individual Therapy  Cadyn Fann 01/24/2022, 7:52 AM

## 2022-01-25 LAB — BASIC METABOLIC PANEL
Anion gap: 9 (ref 5–15)
BUN: 15 mg/dL (ref 8–23)
CO2: 26 mmol/L (ref 22–32)
Calcium: 8.9 mg/dL (ref 8.9–10.3)
Chloride: 102 mmol/L (ref 98–111)
Creatinine, Ser: 1.36 mg/dL — ABNORMAL HIGH (ref 0.61–1.24)
GFR, Estimated: 51 mL/min — ABNORMAL LOW (ref >60)
Glucose, Bld: 91 mg/dL (ref 70–99)
Potassium: 3.8 mmol/L (ref 3.5–5.1)
Sodium: 137 mmol/L (ref 135–145)

## 2022-01-25 NOTE — Progress Notes (Signed)
Occupational Therapy Discharge Summary  Patient Details  Name: Eugene [name] MRN: [id number] Date of Birth: 04-Mar-1936  Date of Discharge from [place] [facility], 2023   Patient has met 9 of 9 long term goals due to improved activity tolerance, improved balance, postural control, ability to compensate for deficits, improved attention, improved awareness, and improved coordination.  Patient to discharge at overall Supervision level.  Patient's care partner is independent to provide the necessary physical assistance at discharge.    Reasons goals not met: n/A  Recommendation:  Patient will benefit from ongoing skilled OT services in home health setting to continue to advance functional skills in the area of BADL.  Equipment: No equipment provided  Reasons for discharge: treatment goals met and discharge from hospital  Patient/family agrees with progress made and goals achieved: Yes  OT Discharge Precautions/Restrictions  Precautions Precautions: Fall Precaution Comments: Rib fractures on L side Restrictions Weight Bearing Restrictions: No General   Vital Signs   Pain Pain Assessment Pain Scale: 0-10 Pain Score: 0-No pain ADL ADL Eating: Supervision/safety Where Assessed-Eating: Chair Grooming: Supervision/safety Where Assessed-Grooming: Chair Upper Body Bathing: Supervision/safety Where Assessed-Upper Body Bathing: Chair Lower Body Bathing: Supervision/safety Where Assessed-Lower Body Bathing: Chair Upper Body Dressing: Supervision/safety Where Assessed-Upper Body Dressing: Sitting at sink Lower Body Dressing: Minimal assistance Where Assessed-Lower Body Dressing: Standing at sink, Sitting at sink Toileting: Supervision/safety Where Assessed-Toileting: Glass blower/designer: Distant supervision Armed forces technical officer Method: Magazine features editor: Curator Method: Heritage manager: Midwife Baseline Vision/History: 1 Wears glasses Patient Visual Report: No change from baseline Vision Assessment?: No apparent visual deficits Perception  Perception: Within Functional Limits Praxis Praxis: Intact Cognition Cognition Overall Cognitive Status: No family/caregiver present to determine baseline cognitive functioning Arousal/Alertness: Awake/alert Memory: Impaired Memory Impairment: Decreased short term memory Decreased Short Term Memory: Verbal basic;Functional basic Attention: Selective;Sustained;Focused Focused Attention: Appears intact Sustained Attention: Appears intact Sustained Attention Impairment: Verbal basic Selective Attention: Appears intact Awareness: Impaired Awareness Impairment: Emergent impairment;Anticipatory impairment Problem Solving: Impaired Problem Solving Impairment: Functional basic;Verbal basic Executive Function: Decision Making;Reasoning;Self Correcting Reasoning: Impaired Reasoning Impairment: Verbal basic;Functional basic Decision Making: Impaired Behaviors: Poor frustration tolerance Safety/Judgment: Impaired Sensation Sensation Light Touch: Appears Intact Hot/Cold: Appears Intact Proprioception: Appears Intact Coordination Gross Motor Movements are Fluid and Coordinated: No Fine Motor Movements are Fluid and Coordinated: Yes Coordination and Movement Description: generalized weakness Motor  Motor Motor: Within Functional Limits Mobility  Bed Mobility Bed Mobility: Supine to Sit;Sit to Supine Supine to Sit: Supervision/Verbal cueing Sitting - Scoot to Edge of Bed: Supervision/Verbal cueing Sit to Supine: Supervision/Verbal cueing Transfers Sit to Stand: Supervision/Verbal cueing Stand to Sit: Supervision/Verbal cueing  Trunk/Postural Assessment  Cervical Assessment Cervical Assessment: Exceptions to [facility] (forward head) Thoracic Assessment Thoracic Assessment: Exceptions to [facility] (rounded shoulders) Lumbar  Assessment Lumbar Assessment: Exceptions to [facility] (posterior pelvic tilt) Postural Control Postural Control: Deficits on evaluation Righting Reactions: delayed  Balance Balance Balance Assessed: Yes Static Sitting Balance Static Sitting - Balance Support: Feet supported Static Sitting - Level of Assistance: 6: Modified independent (Device/Increase time) Dynamic Sitting Balance Dynamic Sitting - Balance Support: Feet supported Dynamic Sitting - Level of Assistance: 6: Modified independent (Device/Increase time) Static Standing Balance Static Standing - Balance Support: During functional activity;Bilateral upper extremity supported Static Standing - Level of Assistance: 5: Stand by assistance Dynamic Standing Balance Dynamic Standing - Balance Support: During functional activity;Bilateral upper extremity supported Dynamic Standing - Level of Assistance: 5: Stand by assistance (  CGA with higher level functional tasks but (S) with simple stand pivot transfers) Extremity/Trunk Assessment RUE Assessment RUE Assessment: Exceptions to [facility] General Strength Comments: Limited shoulder ROM at baseline ~100 degrees of FF LUE Assessment LUE Assessment: Exceptions to [facility] General Strength Comments: Limited shoulder ROM at baseline ~100 degrees of FF      Lowella Dell Emrey Thornley 01/25/2022, 1:48 PM

## 2022-01-25 NOTE — Progress Notes (Signed)
Inpatient Rehabilitation Care Coordinator Discharge Note   Patient Details  Name: Eugene [name] MRN: [id number] Date of Birth: June 03, 1935   Discharge location: HOME WITH 24/7 CAREGIVER AND DAUGHTER VERY INVOLVED  Length of Stay: 9 DAYS  Discharge activity level: SUPERVISION-CGA LEVEL  Home/community participation: ACTIVE  Patient response JS:EGBTDV Literacy - How often do you need to have someone help you when you read instructions, pamphlets, or other written material from your doctor or pharmacy?: Never  Patient response VO:HYWVPX Isolation - How often do you feel lonely or isolated from those around you?: Never  Services provided included: MD, RD, PT, OT, RN, CM, TR, Pharmacy, SW  Financial Services:  Financial Services Utilized: Medicare    Choices offered to/list presented to: PT AND DAUGHTER  Follow-up services arranged:  Home Health, DME, Patient/Family request agency HH/DME Home Health Agency: [facility]-PT OT  RN    DME : [facility]  3 IN 1 HH/DME Requested Agency: ACTIVE WITH [facility] PRIOR TO ADMISSION  Patient response to transportation need: Is the patient able to respond to transportation needs?: Yes In the past 12 months, has lack of transportation kept you from medical appointments or from getting medications?: No In the past 12 months, has lack of transportation kept you from meetings, work, or from getting things needed for daily living?: No    Comments (or additional information): PT DID WELL WHILE ON REHAB AND DAUGHTER WAS HERE DAILY AND WAS INVOLVED IN HIS CARE. WILL HAVE 24/7 CAREGIVERS RESUME AT DC  Patient/Family verbalized understanding of follow-up arrangements:  Yes  Individual responsible for coordination of the follow-up plan: [facility] [phone]  Confirmed correct DME delivered: Lucy Chris 01/25/2022    Bellamy Judson, Lemar Livings

## 2022-01-25 NOTE — Progress Notes (Signed)
Inpatient Rehabilitation Discharge Medication Review by a Pharmacist  A complete drug regimen review was completed for this patient to identify any potential clinically significant medication issues.  High Risk Drug Classes Is patient taking? Indication by Medication  Antipsychotic Yes Seroquel - agitation, sleep  Anticoagulant Yes Eliquis - Afib  Antibiotic No   Opioid No   Antiplatelet No   Hypoglycemics/insulin No   Vasoactive Medication Yes Toprol - HR, BP  Chemotherapy No   Other Yes Alprazolam - anxiety Tamsulosin - BPH Omeprazole- Reflux Atorvastatin - HLD Keppra - seizure ppx     Type of Medication Issue Identified Description of Issue Recommendation(s)  Drug Interaction(s) (clinically significant)     Duplicate Therapy     Allergy     No Medication Administration End Date     Incorrect Dose     Additional Drug Therapy Needed     Significant med changes from prior encounter (inform family/care partners about these prior to discharge).    Other       Clinically significant medication issues were identified that warrant physician communication and completion of prescribed/recommended actions by midnight of the next day:  No  Pharmacist comments: None  Time spent performing this drug regimen review (minutes): 30 minutes   Elwin Sleight 01/25/2022 1:23 PM

## 2022-01-25 NOTE — Progress Notes (Signed)
PROGRESS NOTE   Subjective/Complaints: Pt reports no issues- ready for dd/c tomorrow- asking about d/c time and plan. Went over it's 9-11 am and that PA wil go over meds, d/c f/u's etc prior to d/c.   Admits not drinking "great".   Review of Systems  Constitutional:  Negative for chills, fever and weight loss.  Eyes:  Negative for double vision.  Respiratory:  Negative for cough.   Cardiovascular:  Negative for chest pain and palpitations.  Gastrointestinal: Negative.  Negative for abdominal pain and constipation.  Genitourinary: Negative.   Neurological:  Positive for weakness.    Objective:   No results found. No results for input(s): "WBC", "HGB", "HCT", "PLT" in the last 72 hours.  Recent Labs    01/23/22 0508 01/25/22 0510  NA 137 137  K 3.9 3.8  CL 102 102  CO2 26 26  GLUCOSE 99 91  BUN 23 15  CREATININE 1.29* 1.36*  CALCIUM 8.6* 8.9     Intake/Output Summary (Last 24 hours) at 01/25/2022 1117 Last data filed at 01/25/2022 1000 Gross per 24 hour  Intake 682 ml  Output 500 ml  Net 182 ml      Pressure Injury 01/11/22 Coccyx Medial Deep Tissue Pressure Injury - Purple or maroon localized area of discolored intact skin or blood-filled blister due to damage of underlying soft tissue from pressure and/or shear. DTI sacrum (Active)  01/11/22 2235  Location: Coccyx  Location Orientation: Medial  Staging: Deep Tissue Pressure Injury - Purple or maroon localized area of discolored intact skin or blood-filled blister due to damage of underlying soft tissue from pressure and/or shear.  Wound Description (Comments): DTI sacrum  Present on Admission: Yes    Physical Exam: Vital Signs Blood pressure 138/86, pulse 82, temperature 98.8 F (37.1 C), resp. rate 14, height 6' (1.829 m), weight 83 kg, SpO2 97 %.    General: awake, alert, appropriate, sitting up in bedside chair; frail appearing, but tall;  NAD HENT: conjugate gaze; oropharynx dry- and lips dry CV: regular rate; no JVD Pulmonary: CTA B/L; no W/R/R- good air movement GI: soft, NT, ND, (+)BS Psychiatric: appropriate Neurological: Ox3- alert  Ext: no clubbing, cyanosis, or edema Psych: pleasant and cooperative  Skin: sacral wound not visualized today Neurologic: Cranial nerves II through XII intact, motor strength is 4/5 in bilateral deltoid, bicep, tricep, grip, hip flexor, knee extensors, ankle dorsiflexor and plantar flexor Sensory exam normal sensation to light touch and proprioception in bilateral upper and lower extremities Cerebellar exam normal finger to nose to finger as well as heel to shin in bilateral upper and lower extremities Musculoskeletal: Full range of motion in all 4 extremities. No joint swelling. Neck and shoulder discomfort in sitting position, head forward posture    Assessment/Plan: 1. Functional deficits which require 3+ hours per day of interdisciplinary therapy in a comprehensive inpatient rehab setting. Physiatrist is providing close team supervision and 24 hour management of active medical problems listed below. Physiatrist and rehab team continue to assess barriers to discharge/monitor patient progress toward functional and medical goals  Care Tool:  Bathing    Body parts bathed by patient: Right arm, Left arm, Chest, Abdomen,  Front perineal area, Buttocks, Right upper leg, Left upper leg, Face, Right lower leg, Left lower leg   Body parts bathed by helper: Right lower leg, Left lower leg     Bathing assist Assist Level: Supervision/Verbal cueing     Upper Body Dressing/Undressing Upper body dressing   What is the patient wearing?: Pull over shirt    Upper body assist Assist Level: Supervision/Verbal cueing    Lower Body Dressing/Undressing Lower body dressing      What is the patient wearing?: Pants, Incontinence brief     Lower body assist Assist for lower body dressing:  Minimal Assistance - Patient > 75%     Toileting Toileting Toileting Activity did not occur (Clothing management and hygiene only): N/A (no void or bm)  Toileting assist Assist for toileting: Supervision/Verbal cueing     Transfers Chair/bed transfer  Transfers assist  Chair/bed transfer activity did not occur: Safety/medical concerns  Chair/bed transfer assist level: Supervision/Verbal cueing     Locomotion Ambulation   Ambulation assist   Ambulation activity did not occur: Safety/medical concerns (Unable to perform ambulation and stair mobility secondary to poor endurance/activity tolerance and global weakness.)          Walk 10 feet activity   Assist  Walk 10 feet activity did not occur: Safety/medical concerns        Walk 50 feet activity   Assist Walk 50 feet with 2 turns activity did not occur: Safety/medical concerns         Walk 150 feet activity   Assist Walk 150 feet activity did not occur: Safety/medical concerns         Walk 10 feet on uneven surface  activity   Assist Walk 10 feet on uneven surfaces activity did not occur: Safety/medical concerns         Wheelchair     Assist Is the patient using a wheelchair?: Yes Type of Wheelchair: Manual    Wheelchair assist level: Total Assistance - Patient < 25% Max wheelchair distance: Patient able to propel ~20' and then required total assistance for the remaining 130'    Wheelchair 50 feet with 2 turns activity    Assist        Assist Level: Maximal Assistance - Patient 25 - 49%   Wheelchair 150 feet activity     Assist      Assist Level: Total Assistance - Patient < 25%   Blood pressure 138/86, pulse 82, temperature 98.8 F (37.1 C), resp. rate 14, height 6' (1.829 m), weight 83 kg, SpO2 97 %.      Medical Problem List and Plan: 1. Functional deficits secondary to pulmonary debility             -patient may shower             -ELOS/Goals: 10-14 days              -Continue CIR  -Will consult SLP due to coughing with drinking water for swallow eval  -Interdisciplinary Team Conference today Con't CIR- PT and OT- d/c tomorrow   2.  Antithrombotics: -DVT/anticoagulation:  Mechanical: Sequential compression devices, below knee Bilateral lower extremities -Eliquis started by cardiology             -antiplatelet therapy: N/A 3. L shoulder pain: add voltaren gel prn. Oxycodone prn.   8/31- denies pain- con't regimen prn 4. Mood/Behavior/Sleep: LCSW to follow for evaluation and support.              -  antipsychotic agents: N/A 5. Neuropsych/cognition: This patient is capable of making decisions on his own behalf. 6. Skin/Wound Care: Routine pressure relief measures.              --foam dressing to DTI w/chronic tissue damage.              --continue nutritional supplements.  7. Fluids/Electrolytes/Nutrition: Monitor I/O. Check CMET in am             --continue nutritional supplements.  8/31- Cr up slightly to 1.35 but BUN down to 15- so drinking more- lips/oropharynx still dry- push fluids-  8. Left hemothorax: has resolved. Pulmonary hygiene.             --follow up with Pulmonary in 5 weeks w/CxR. 9. A fib: Monitor HR TID--continue Metoprolol BID  -appears to be mildly tachycardic chronically, continue to monitor, consider increase dose metoprolol  -Afib with RVR noted, Cardiology started metoprolol XL 100mg  Daily(can reduce to 36ml XL if BP dont tolerate), Eliquis 5mg  started by cardiology, appreciate cardiology assistance  -8/29 HR stable in 90s , continue to monitor 10. ABLA: Stable overall. Recheck CBC in am.             --h/o hemochromatosis.       Latest Ref Rng & Units 01/22/2022    6:19 AM 01/19/2022    6:21 AM 01/18/2022    5:29 AM  CBC  WBC 4.0 - 10.5 K/uL 5.7  8.8  9.0   Hemoglobin 13.0 - 17.0 g/dL 01/21/2022  01/20/2022  56.4   Hematocrit 39.0 - 52.0 % 35.7  32.4  30.6   Platelets 150 - 400 K/uL 346  233  214    Slowly improving ,  normochromic normocytic  11. H/o depression w/anxiety: Continue Xanax 0.5 mg bid w/ Seroquel 25 mg/HS.  -8/26 Seroquel was decreased to 12.5mg /HS due to sedation 12. H/o Seizure d/o: Continue Keppra bid.  13. Acute on chronic renal failure: Baseline SCr 1.27-1.3. Lasix on hold.                 Latest Ref Rng & Units 01/25/2022    5:10 AM 01/23/2022    5:08 AM 01/22/2022    6:19 AM  BMP  Glucose 70 - 99 mg/dL 91  99  96   BUN 8 - 23 mg/dL 15  23  22    Creatinine 0.61 - 1.24 mg/dL 01/25/2022  01/24/2022    Sodium 135 - 145 mmol/L 137  137  139   Potassium 3.5 - 5.1 mmol/L 3.8  3.9  3.4   Chloride 98 - 111 mmol/L 102  102  102   CO2 22 - 32 mmol/L 26  26  28    Calcium 8.9 - 10.3 mg/dL 8.9  8.6  8.7    Fairly stable Cr although slight incr 8/29.  may resume Lasix if needed .  No LE edema. Chest generally clear with decr sounds at bases  Repeat BMP tomorrow  8/31- Cr 1.36- in range of chronic Cr and BUN better at 15 from 23- con't to push fluids 14. Airspace disease on CXR: incentive spirometer ordered. 15. Pleural effusion: daily weights ordered to monitor for fluid overload while off of Lasix.   -Weight stable- no significant change as of 8/31 Filed Weights   01/23/22 0458 01/24/22 0427 01/25/22 0500  Weight: 83.7 kg 82 kg 83 kg      17. Fatigue: ordered iron, B12, and folate with morning labs 18. Neck  pain, suspect from increased use with therapy  -re-ordered kpad today, discussed posture.   19.  HypoK+ mild, likely nutritional , no diuretics  oral supplement x 1 - -K+ 3.9 today -recheck labs Friday  20.  GERD- increase protonix to 40mg  BID, up in chair for meals  21. Dysphagia:  8/29 -MBS performed yesterday by SLP which demonstrated mild oropharyngeal dysphagia. Also appears to have esophageal component (retrograde bolus flow thru cervical esophagus).   -diet adjusted to D2/thins, meds crushed or whole in puree  -slp to follow and monitor for progress as he gets stronger.   -might  benefit from GI assessment given esophageal component. Pt does not want to pursue at this point however.   Increase Protonix to 40mg  BID    LOS: 8 days A FACE TO FACE EVALUATION WAS PERFORMED  Eugene [name] 01/25/2022, 11:17 AM

## 2022-01-25 NOTE — Progress Notes (Signed)
Occupational Therapy Session Note  Patient Details  Name: Eugene [name] MRN: [id number] Date of Birth: 1935-09-05  Today's Date: 01/25/2022 OT Individual Time: 1034-1100 OT Individual Time Calculation (min): 26 min   Today's Date: 01/25/2022 OT Individual Time: 1345-1435 OT Individual Time Calculation (min): 50 min   Short Term Goals: Week 1:  OT Short Term Goal 1 (Week 1): Patient will complete sit<>stand with mod A in preparation for BADL tasks OT Short Term Goal 2 (Week 1): Patient will tolerate standing at the sink for 1 minute during BADL task. OT Short Term Goal 3 (Week 1): Patient will complete toilet transfer with mod A.  Skilled Therapeutic Interventions/Progress Updates:     Pt received in w/c with no pain  ADL: Pt completes shower stall transfer practice for stepping over small 4" threshold to simulate home environment with seat. Pt requires supervision and VC for sequnce after demo of transfers. Pt with min diffcilty standing from shower stool, but able to power up with second attempt.  Therapeutic activity Pt completes bean bag toss in standing position with RW on compliant surface to challenge balance and CGA A overall. Activity performed to improve dynamic balance and functional reach in mod-max ranges outside BOS in prep for BADLs/IADLs.   Pt left at end of session in w/c with exit alarm on, call light in reach and all needs met  Session 2:  Pt received in w/c with no pain initially but then reporting R thigh pain with heat pack provided with relief.  ADL: Toileting with supervision at [facility] with standing over the toilet for bladder void.  Therapeutic exercise Functional mobility with RW to/from all tx destinations >250 x2 feet with RW and supervision for improved activity tolerance  Sit to stand with no hands and CGA for weight shift forward x5, sit to stand with 1 LE on 4" block x3 with each leg with MIN A for power up and posterior bias.   Walk up ramp in  orthogym with supervision for endurance and weight shift forward.   Pt left at end of session in w/c with exit alarm on, call light in reach and all needs met    Therapy Documentation Precautions:  Precautions Precautions: Fall Precaution Comments: Rib fractures on L side Restrictions Weight Bearing Restrictions: No Therapy/Group: Individual Therapy  [name] 01/25/2022, 6:48 AM

## 2022-01-25 NOTE — Progress Notes (Addendum)
Physical Therapy Discharge Summary  Patient Details  Name: Eugene [name] MRN: [id number] Date of Birth: 1935/10/02  Date of Discharge from PT service:January 26, 2022  Today's Date: 01/25/2022 PT Individual Time: 0900-1000 PT Individual Time Calculation (min): 60 min    Patient has met 10 of 10 long term goals due to improved activity tolerance, improved balance, increased strength, and decreased pain.  Patient to discharge at an ambulatory level Supervision.   Patient's care partner is independent to provide the necessary physical and cognitive assistance at discharge. Patient will discharge at an ambulatory level and wheelchair goals are no longer applicable.  Reasons goals not met: NA  Recommendation:  Patient will benefit from ongoing skilled PT services in home health setting to continue to advance safe functional mobility, address ongoing impairments in endurance/activity tolerance, global strength deficits, dynamic stability and minimize fall risk.  Equipment: No equipment provided  Reasons for discharge: treatment goals met and discharge from hospital  Patient/family agrees with progress made and goals achieved: Yes  PT Discharge Precautions/Restrictions Precautions Precautions: Fall Precaution Comments: Rib fractures on L side Restrictions Weight Bearing Restrictions: No Vital Signs Therapy Vitals Temp: 97.9 F (36.6 C) Temp Source: Oral Pulse Rate: 96 Resp: 14 BP: 129/86 Patient Position (if appropriate): Lying Oxygen Therapy SpO2: 91 % O2 Device: Room Air Pain Pain Assessment Pain Scale: 0-10 Pain Score: 0-No pain Pain Interference Pain Interference Pain Effect on Sleep: 2. Occasionally Pain Interference with Therapy Activities: 1. Rarely or not at all Pain Interference with Day-to-Day Activities: 1. Rarely or not at all Vision/Perception  Vision - History Ability to See in Adequate Light: 0 Adequate Perception Perception: Within Functional  Limits Praxis Praxis: Intact  Cognition Overall Cognitive Status: No family/caregiver present to determine baseline cognitive functioning Arousal/Alertness: Awake/alert Orientation Level: Oriented X4 Attention: Selective;Sustained;Focused Focused Attention: Appears intact Sustained Attention: Appears intact Selective Attention: Appears intact Memory: Impaired Memory Impairment: Decreased short term memory Decreased Short Term Memory: Verbal basic;Functional basic Awareness: Impaired Awareness Impairment: Emergent impairment;Anticipatory impairment Problem Solving: Impaired Problem Solving Impairment: Functional basic;Verbal basic Reasoning: Impaired Reasoning Impairment: Verbal basic;Functional basic Decision Making: Impaired Behaviors: Poor frustration tolerance Safety/Judgment: Impaired Sensation Sensation Light Touch: Appears Intact Hot/Cold: Appears Intact Proprioception: Appears Intact Stereognosis: Not tested Coordination Gross Motor Movements are Fluid and Coordinated: No Fine Motor Movements are Fluid and Coordinated: Yes Coordination and Movement Description: generalized weakness Motor  Motor Motor: Within Functional Limits Motor - Skilled Clinical Observations: Patient noted to be rigid with functional mobility tasks.  Mobility Bed Mobility Bed Mobility: Rolling Right;Rolling Left;Sit to Supine;Supine to Sit Rolling Right: Supervision/verbal cueing Rolling Left: Supervision/Verbal cueing Supine to Sit: Supervision/Verbal cueing Sit to Supine: Supervision/Verbal cueing Transfers Transfers: Sit to Stand;Stand to Sit;Stand Pivot Transfers Sit to Stand: Supervision/Verbal cueing Stand to Sit: Supervision/Verbal cueing Stand Pivot Transfers: Supervision/Verbal cueing Stand Pivot Transfer Details: Tactile cues for weight shifting;Verbal cues for technique;Verbal cues for sequencing;Verbal cues for precautions/safety Transfer (Assistive device): Rolling  walker Locomotion  Gait Ambulation: Yes Gait Assistance: Supervision/Verbal cueing Gait Distance (Feet): 150 Feet Assistive device: Rolling walker Gait Assistance Details: Verbal cues for precautions/safety;Verbal cues for safe use of DME/AE Gait Gait: Yes Gait Pattern: Decreased stride length;Narrow base of support;Trunk flexed Gait velocity: Decreased Stairs / Additional Locomotion Stairs: Yes Stairs Assistance: Supervision/Verbal cueing Stair Management Technique: Two rails;Alternating pattern Number of Stairs: 12 Ramp: Supervision/Verbal cueing Curb: Supervision/Verbal cueing Pick up small object from the floor assist level: Supervision/Verbal cueing Pick up small object from the floor assistive  device: RW Architect: No  Trunk/Postural Assessment  Cervical Assessment Cervical Assessment: Exceptions to [facility] (Forward head) Thoracic Assessment Thoracic Assessment: Exceptions to [facility] (Rounded shoulders) Lumbar Assessment Lumbar Assessment: Exceptions to [facility] (Posteriorly rotated) Postural Control Postural Control: Deficits on evaluation Righting Reactions: delayed  Balance Balance Balance Assessed: Yes Static Sitting Balance Static Sitting - Balance Support: Feet supported Static Sitting - Level of Assistance: 6: Modified independent (Device/Increase time) Dynamic Sitting Balance Dynamic Sitting - Balance Support: Feet supported Dynamic Sitting - Level of Assistance: 6: Modified independent (Device/Increase time) Static Standing Balance Static Standing - Balance Support: During functional activity;Bilateral upper extremity supported Static Standing - Level of Assistance: 5: Stand by assistance Dynamic Standing Balance Dynamic Standing - Balance Support: During functional activity;Bilateral upper extremity supported Dynamic Standing - Level of Assistance: 5: Stand by assistance Extremity Assessment      RLE Assessment RLE Assessment:  Exceptions to [facility] General Strength Comments: Decreased strength LLE Assessment LLE Assessment: Exceptions to [facility] General Strength Comments: Decreased strength   Skilled Therapy: Patient greeted sitting upright in wheelchair in room and agreeable to PT treatment session. Patient brought to the rehab gym to initiate gait trial, however upon standing reported need to use restroom. Patient returned to his room for BM.   Patient ambulated to/from his wheelchair and bathroom in room with RW and supervision- Patient was able to doff pants, however required assistance for pericare and donning new brief and pants. Patient was able to stand at the sink unsupported and wash hands with no LOB.   Patient gait trained 150' with RW and supervision for safety.   Patient ascended/descended x12 steps with B HR and supervision.   Patient performed a car transfer with RW and supervision for safety- Once sitting patient was able to place B LE into/out of the car independently.   Patient ascended/descended a low grade ramp with RW and supervision- VC for improved awareness of changes in terrain in order to improve overall safety.   Patient was able to stand with the use of a RW and pick up an item from the floor with supervision. Patient educated on the use of a reacher in order to improve overall safety or having his aid assist him at home in order to prevent falling.   Patient performed various sit/stands and transfers with the use of a RW and supervision for safety- VC for increased anterior weight shift with poor carryover noted despite cues, repetition and demonstration.   Patient performed 5xSTS with RW and supervision in 41.23 seconds.   Therapist and patient discussed HEP and provided with handout in order to perform at home once discharged in order to maintain strength-   Access Code: [email or web address] URL: [email or web address] Date: 01/25/2022 Prepared by: Jodi Mourning Tamyia Minich  Exercises - Seated  Long Arc Quad  - 1 x daily - 7 x weekly - 3 sets - 10 reps - Seated March  - 1 x daily - 7 x weekly - 3 sets - 10 reps - Standing Heel Raises  - 1 x daily - 7 x weekly - 3 sets - 10 reps - Standing Hip Abduction with Counter Support  - 1 x daily - 7 x weekly - 3 sets - 10 reps - Standing Hip Extension with Counter Support  - 1 x daily - 7 x weekly - 3 sets - 10 reps - Sit to Stand with Counter Support  - 1 x daily - 7 x weekly - 3 sets - 10 reps  Patient left sitting upright in wheelchair in room with call bell within reach, posey belt on, and all needs met.   [place] 01/25/2022, 4:14 PM

## 2022-01-25 NOTE — Plan of Care (Signed)
  Problem: RH Swallowing Goal: LTG Patient will consume least restrictive diet using compensatory strategies with assistance (SLP) Description: LTG:  Patient will consume least restrictive diet using compensatory strategies with assistance (SLP) Outcome: Completed/Met Goal: LTG Patient will participate in dysphagia therapy to increase swallow function with assistance (SLP) Description: LTG:  Patient will participate in dysphagia therapy to increase swallow function with assistance (SLP) Outcome: Completed/Met

## 2022-01-25 NOTE — Progress Notes (Signed)
Speech Language Pathology Discharge Summary  Patient Details  Name: Eugene [name] MRN: [id number] Date of Birth: 05-May-1936  Date of Discharge from SLP service:January 25, 2022  Today's Date: 01/25/2022 SLP Individual Time:  1200-1215 Total Time: 15 minutes   Skilled Therapeutic Interventions:   S: Pt seen this date for skilled ST intervention targeting deglutition goals outlined above. Pt received sleeping in w/c; easily aroused to name. Agreeable to ST intervention in hospital room. Pt noted to have multiple straws in his room despite aspiration precaution sign that states "no straws." Will plan to report incident in [facility] portal.   O: SLP facilitated today's session by providing Sup A verbal cues for implementation of aspiration precautions during consumption of lunch meal. Provided re-education re: slow rate, reflux precautions, and effortful swallow. Pt with throat clear x 1 with vocal quality dry post-swallow. Wet vocal quality observed x 1 and benefited from verbal cue to clear. Following cue, pt's vocal quality returned to baseline. No additional s/sx concerning for aspiration observed. Per chart review, VSS.  A: Reviewed aspiration precautions to include recommendations for no straws post discharge. Pt verbalized understanding and states "you will not find a single straw at my house."  P: Pt left in w/c with all safety measures activated. Direct hand off to NT for full supervision of lunch tray. Will d/c from ST intervention to prepare for upcoming hospital next date.       Patient has met 2 of 2 long term goals.  Patient to discharge at overall Supervision level.  Reasons goals not met: N/A   Clinical Impression/Discharge Summary:  Pt has demonstrated functional gains re: deglutition since admission to CIR. MBSS completed on 01/22/2022 with recommendations for Dysphagia textures and thin liquids via cup (NO STRAWS) with implementation of effortful swallow and multiple  swallows per bolus. Retrograde bolus flow also observed on study and pt on PPI. Pt's daughter briefly educated on results of swallow study. Pt implements aspiration precautions with overall Sup A verbal cues. Recommend short-course of ST follow-up s/p discharge to ensure tolerance. Pt in agreement with plan and will have caregivers to assist 24/7.  Care Partner:  Caregiver Able to Provide Assistance: Yes  Type of Caregiver Assistance: Physical  Recommendation:  Home Health SLP;24 hour supervision/assistance  Rationale for SLP Follow Up: Maximize swallowing safety;Reduce caregiver burden   Equipment: N/A   Reasons for discharge: Discharged from hospital   Patient/Family Agrees with Progress Made and Goals Achieved: Yes    Anagha Loseke A Dahir Ayer 01/25/2022, 9:11 PM

## 2022-01-25 NOTE — Progress Notes (Signed)
Physical Therapy Session Note  Patient Details  Name: Eugene [name] MRN: [id number] Date of Birth: 08-12-1935  Today's Date: 01/25/2022   Short Term Goals: Week 1:  PT Short Term Goal 1 (Week 1): Patient will complete bed mobility with moderate assistance PT Short Term Goal 2 (Week 1): Patient will perform sit/stand with RW and moderate assistance PT Short Term Goal 3 (Week 1): Patient will perform stand pivot transfer with RW and moderate assistance   Skilled Therapeutic Interventions/Progress Updates:   Pt received sitting in WC. Pt voices concerns with having nearly 3 hours of therapy consecutively this AM and inability to participate in Prolonged therapy after starting so early in the day. Pt left in room sitting in [facility] given concerns of fatigue. Will re-attempt PT treatment at later time, provided pt is agreeable to treatment:.          Therapy Documentation Precautions:  Precautions Precautions: Fall Precaution Comments: Rib fractures on L side Restrictions Weight Bearing Restrictions: No General: PT Amount of Missed Time (min): 60 Minutes PT Missed Treatment Reason: Patient fatigue    Therapy/Group: Individual Therapy  [name] 01/25/2022, 8:50 AM

## 2022-01-26 DIAGNOSIS — N1831 Chronic kidney disease, stage 3a: Secondary | ICD-10-CM

## 2022-01-26 LAB — BASIC METABOLIC PANEL
Anion gap: 9 (ref 5–15)
BUN: 17 mg/dL (ref 8–23)
CO2: 28 mmol/L (ref 22–32)
Calcium: 9.1 mg/dL (ref 8.9–10.3)
Chloride: 100 mmol/L (ref 98–111)
Creatinine, Ser: 1.32 mg/dL — ABNORMAL HIGH (ref 0.61–1.24)
GFR, Estimated: 53 mL/min — ABNORMAL LOW (ref >60)
Glucose, Bld: 115 mg/dL — ABNORMAL HIGH (ref 70–99)
Potassium: 3.5 mmol/L (ref 3.5–5.1)
Sodium: 137 mmol/L (ref 135–145)

## 2022-01-26 MED ORDER — LEVETIRACETAM 500 MG PO TABS: 500.0000 mg | ORAL_TABLET | Freq: Two times a day (BID) | ORAL | 1 refills | Status: AC

## 2022-01-26 MED ORDER — ALPRAZOLAM 1 MG PO TABS: 0.5000 mg | ORAL_TABLET | Freq: Two times a day (BID) | ORAL | 0 refills | Status: DC

## 2022-01-26 MED ORDER — DICLOFENAC SODIUM 1 % EX GEL: 2.0000 g | Freq: Four times a day (QID) | CUTANEOUS | 1 refills | Status: AC | PRN

## 2022-01-26 MED ORDER — QUETIAPINE FUMARATE 25 MG PO TABS: 12.5000 mg | ORAL_TABLET | Freq: Every day | ORAL | 0 refills | Status: DC

## 2022-01-26 MED ORDER — ADULT MULTIVITAMIN W/MINERALS CH: 1.0000 | ORAL_TABLET | Freq: Every day | ORAL | Status: AC

## 2022-01-26 MED ORDER — METOPROLOL SUCCINATE ER 100 MG PO TB24: 100.0000 mg | ORAL_TABLET | Freq: Every day | ORAL | 0 refills | Status: DC

## 2022-01-26 MED ORDER — TAMSULOSIN HCL 0.4 MG PO CAPS: 0.4000 mg | ORAL_CAPSULE | Freq: Every day | ORAL | 0 refills | Status: DC

## 2022-01-26 MED ORDER — FERROUS SULFATE 325 (65 FE) MG PO TABS: 325.0000 mg | ORAL_TABLET | Freq: Every day | ORAL | 3 refills | Status: DC

## 2022-01-26 MED ORDER — DOCUSATE SODIUM 100 MG PO CAPS: 100.0000 mg | ORAL_CAPSULE | Freq: Two times a day (BID) | ORAL | 0 refills | Status: DC

## 2022-01-26 MED ORDER — PANTOPRAZOLE SODIUM 40 MG PO TBEC: 40.0000 mg | DELAYED_RELEASE_TABLET | Freq: Two times a day (BID) | ORAL | 0 refills | Status: DC

## 2022-01-26 MED ORDER — APIXABAN 5 MG PO TABS: 5.0000 mg | ORAL_TABLET | Freq: Two times a day (BID) | ORAL | 0 refills | Status: DC

## 2022-01-26 MED ORDER — POLYETHYLENE GLYCOL 3350 17 G PO PACK: 17.0000 g | PACK | Freq: Every day | ORAL | 0 refills | Status: AC | PRN

## 2022-01-26 NOTE — Discharge Summary (Signed)
Physician Discharge Summary  Patient ID: Eugene [name] MRN: [id number] DOB/AGE: 09-19-1935 86 y.o.  Admit date: 01/17/2022 Discharge date: 01/26/2022  Discharge Diagnoses:  Principal Problem:   Debility Active Problems:   Atrial fibrillation (HCC)   Depressive reaction   Fever, unspecified   Neck pain   Discharged Condition: {condition:18240}  Significant Diagnostic Studies: DG Swallowing Func-Speech Pathology  Result Date: 01/22/2022 Table formatting from the original result was not included. Images from the original result were not included. Objective Swallowing Evaluation: Type of Study: MBS-Modified Barium Swallow Study  Patient Details Name: Eugene [name] MRN: [id number] Date of Birth: [birth year] Today's Date: 01/22/2022 Time: No data recorded-No data recorded No data recorded Past Medical History: Past Medical History: Diagnosis Date  AAA (abdominal aortic aneurysm) (HCC) 06/29/2007  stent graft  Aortic stenosis 09/05/2017  Mild AS/AR by echo 01/2017.  GERD (gastroesophageal reflux disease)   Hemochromatosis   Hyperlipidemia   Hypertension   Irritable bowel syndrome   Permanent atrial fibrillation (HCC)   Stroke (HCC)   Transient Ischemic Attack Past Surgical History: Past Surgical History: Procedure Laterality Date  ABDOMINAL AORTIC ANEURYSM REPAIR    ENDOVASCULAR STENT INSERTION  07/18/2007  saccular infrarenal aortic aneurysm  EYE SURGERY Bilateral   Cataract    IR FLUORO GUIDE CV LINE LEFT  12/08/2018  IR FLUORO GUIDE CV LINE RIGHT  02/13/2017  IR FLUORO GUIDE CV LINE RIGHT  02/25/2017  IR US GUIDE VASC ACCESS RIGHT  02/13/2017  PICC LINE PLACE PERIPHERAL ([facility] HX)  Oct 21, 2014 HPI: History of Present Illness: Pt is an 86 y/o male with PMH of HTN, HLD, CVA in 2007, AAA s/p stent, Afib, HFpEF (last ECHO 50-55% in 2023), seizures, and hemachromatosis, follows with Dr. Leonides Schanz.  He was admitted to [facility] on 01/11/22 after falling and landing on his left rib cage with c/o SOB and rib pain.   Found to have large hemo/pneumothorax.  Received total 3 units PRBCs.  Chest tube d/c'd on 8/21 and repeat chest xrays stable.  Therapy evaluations were completed and pt was recommended for CIR.  No data recorded  Recommendations for follow up therapy are one component of a multi-disciplinary discharge planning process, led by the attending physician.  Recommendations may be updated based on patient status, additional functional criteria and insurance authorization. Assessment / Plan / Recommendation   01/22/2022   1:32 PM Clinical Impressions Clinical Impression MBSS completed this date secondary to consistent s/sx concerning for airway invasion with intake of thin liquids during CSE. Pt presents with overall mild oropharyngeal dysphagia that appears to be r/t generalized weakness superimposed on presbyphagia changes that were likely present at baseline. Additionally, pt presents with evidence of retrograde bolus flow through the cervical esophagus to the level of pyriform sinuses, which flowed into the interarytenoid space x 1. Oral phase remarkable for lingual weakness, decreased bolus manipulation + cohesion, and premature spillage. Pharyngeal phase remarkable for decreased pharyngeal drive/peristalsis, decreased base of tongue approximation to posterior pharyngeal wall, and reduced laryngeal closure with large boluses. Aforementioned deficits resulted in shallow penetration of thin liquid via cup and deep penetration with thin liquid via straw; however, no aspiration visualized. No penetration nor aspiration appreciated with intake of nectar-thick liquid via cup and straw, puree, and mechanical soft textures. Generalized oropharyngeal weakness resulted in mild to moderate pharyngeal residuals within the vallecular space and trace to mild along the lateral channels, posterior pharyngeal wall, and within the pyriform sinuses. Cued effortful swallow and multiple swallows per  bolus (2-3) appeared beneficial in  clearing pharyngeal stasis, which was noted to reduce from moderate to minimal, minimal to trace. Volitional throat clear noted x 1 with visualization of retrograde flow coming back up through the UES and entering the interarytenoid space; scant in volume. Throat clear cleared penetrated material with cued secondary swallow clearing residual. Given instrumental finding, pt's preference, and current medical + cognitive status, recommend initiation of Dysphagia 2 diet with thin liquids (no straws) with strict adherence to general aspiration and reflux precautions outlined below. May upgrade solid textures, clinically, as pt's endurance and strength improves. If pt exhibits s/sx concerning for airway invasion and demonstrates change in vital signs and medical status, with thin liquid intake, may downgrade to nectar-thick liquids. Recommend medications be crushed or given whole (if unable to be crushed) in puree. May consider GI consult given esophageal findings for further work-up, if pt is amenable. This date, pt reports he does not have an interest in following up with a GI provider. Will notify family of findings. Pt, LPN, MD, and pt's daughter educated on findings and recommendations, and in agreement with proposed plan. Please see below for additional details.   SLP Visit Diagnosis Dysphagia, oropharyngeal phase (R13.12) Impact on safety and function Mild aspiration risk     08/26/2021   2:00 PM Treatment Recommendations Treatment Recommendations No treatment recommended at this time     01/22/2022   1:33 PM Prognosis Prognosis for Safe Diet Advancement Good   01/22/2022   1:32 PM Diet Recommendations SLP Diet Recommendations Dysphagia 2 (Fine chop) solids;Thin liquid Liquid Administration via Cup;No straw Medication Administration Crushed with puree Compensations Minimize environmental distractions;Slow rate;Small sips/bites;Clear throat intermittently;Effortful swallow;Multiple dry swallows after each bite/sip  Postural Changes Remain semi-upright after after feeds/meals (Comment);Seated upright at 90 degrees     01/22/2022   1:32 PM Other Recommendations Recommended Consults Consider GI evaluation;Consider esophageal assessment Oral Care Recommendations Oral care BID    No data to display        01/22/2022   1:24 PM Oral Phase Oral Phase Impaired Oral - Nectar Teaspoon NT Oral - Nectar Cup Weak lingual manipulation;Lingual pumping;Lingual/palatal residue;Delayed oral transit;Decreased bolus cohesion;Premature spillage Oral - Nectar Straw Weak lingual manipulation;Lingual pumping;Lingual/palatal residue;Delayed oral transit;Decreased bolus cohesion;Premature spillage Oral - Thin Teaspoon Weak lingual manipulation;Lingual pumping;Decreased bolus cohesion;Premature spillage Oral - Thin Cup Weak lingual manipulation;Lingual pumping;Lingual/palatal residue;Delayed oral transit;Decreased bolus cohesion;Premature spillage Oral - Thin Straw Weak lingual manipulation;Lingual pumping;Delayed oral transit;Decreased bolus cohesion;Premature spillage;Lingual/palatal residue Oral - Puree Weak lingual manipulation;Lingual pumping;Delayed oral transit;Decreased bolus cohesion;Premature spillage Oral - Mech Soft Weak lingual manipulation;Decreased bolus cohesion;Piecemeal swallowing Oral - Regular NT Oral - Multi-Consistency NT Oral - Pill NT    01/22/2022   1:26 PM Pharyngeal Phase Pharyngeal Phase Impaired Pharyngeal- Nectar Teaspoon NT Pharyngeal- Nectar Cup Delayed swallow initiation-vallecula;Reduced pharyngeal peristalsis;Reduced tongue base retraction;Pharyngeal residue - valleculae;Pharyngeal residue - pyriform;Pharyngeal residue - posterior pharnyx;Lateral channel residue Pharyngeal Material does not enter airway Pharyngeal- Nectar Straw Delayed swallow initiation-vallecula;Reduced pharyngeal peristalsis;Reduced tongue base retraction;Pharyngeal residue - valleculae;Pharyngeal residue - pyriform;Pharyngeal residue - posterior  pharnyx;Lateral channel residue Pharyngeal Material does not enter airway Pharyngeal- Thin Teaspoon Delayed swallow initiation-vallecula;Reduced pharyngeal peristalsis;Reduced tongue base retraction;Pharyngeal residue - pyriform;Pharyngeal residue - valleculae;Pharyngeal residue - posterior pharnyx;Lateral channel residue Pharyngeal Material does not enter airway Pharyngeal- Thin Cup Delayed swallow initiation-vallecula;Reduced pharyngeal peristalsis;Reduced tongue base retraction;Penetration/Aspiration during swallow;Pharyngeal residue - valleculae;Pharyngeal residue - pyriform;Pharyngeal residue - posterior pharnyx;Lateral channel residue Pharyngeal Material enters airway, remains ABOVE vocal cords and  not ejected out Pharyngeal- Thin Straw Delayed swallow initiation-vallecula;Delayed swallow initiation-pyriform sinuses;Reduced pharyngeal peristalsis;Reduced tongue base retraction;Penetration/Aspiration during swallow;Pharyngeal residue - valleculae;Pharyngeal residue - pyriform;Pharyngeal residue - posterior pharnyx;Inter-arytenoid space residue;Lateral channel residue Pharyngeal Material enters airway, remains ABOVE vocal cords and not ejected out Pharyngeal- Puree Delayed swallow initiation-vallecula;Reduced pharyngeal peristalsis;Reduced tongue base retraction;Pharyngeal residue - pyriform;Pharyngeal residue - valleculae;Lateral channel residue Pharyngeal Material does not enter airway Pharyngeal- Mechanical Soft Delayed swallow initiation-vallecula;Reduced pharyngeal peristalsis;Reduced tongue base retraction;Pharyngeal residue - valleculae Pharyngeal Material does not enter airway Pharyngeal- Regular NT Pharyngeal- Multi-consistency NT Pharyngeal- Pill NT    01/22/2022   1:28 PM Cervical Esophageal Phase  Cervical Esophageal Phase Impaired Thin Teaspoon WFL Thin Cup Esophageal backflow into the pharynx;Esophageal backflow into cervical esophagus Thin Straw Esophageal backflow into the pharynx;Esophageal  backflow into cervical esophagus Puree [facility] Mechanical Soft WFL Regular NT Multi-consistency NT Pill NT Bethany A Lutes 01/22/2022, 1:35 PM                     DG Chest 2 View  Result Date: 01/19/2022 CLINICAL DATA:  Hypoxia EXAM: CHEST - 2 VIEW COMPARISON:  01/17/2022, chest CT 11/06/2005 FINDINGS: Small right and small moderate left pleural effusion. Airspace disease at left base. Cardiomegaly with vascular congestion. Aortic atherosclerosis. Tiny left apical pneumothorax noted previously is not well seen on this exam IMPRESSION: 1. Cardiomegaly with vascular congestion. Similar small moderate left effusion with dense airspace disease at the left base 2. Previously noted trace left apical pneumothorax is not well seen on this exam Electronically Signed   By: Jasmine Pang M.D.   On: 01/19/2022 18:49   DG Chest 2 View  Result Date: 01/17/2022 CLINICAL DATA:  Fever. EXAM: CHEST - 2 VIEW COMPARISON:  Radiograph yesterday. FINDINGS: Retrocardiac opacity likely represents a combination of pleural effusion and airspace disease/atelectasis. Tiny left apical pneumothorax is stable. Mild right infrahilar atelectasis, similar. Stable heart size and mediastinal contours. Aortic atherosclerosis. IMPRESSION: 1. Unchanged tiny left apical pneumothorax. 2. Retrocardiac opacity likely combination of pleural effusion and airspace disease/atelectasis. Electronically Signed   By: Narda Rutherford M.D.   On: 01/17/2022 22:14   DG CHEST PORT 1 VIEW  Result Date: 01/16/2022 CLINICAL DATA:  Pneumothorax. EXAM: PORTABLE CHEST 1 VIEW COMPARISON:  Chest 01/15/2022 FINDINGS: Left chest tube removed. Tiny left apical pneumothorax. Left lower lobe airspace disease and small left effusion unchanged. Mild right lower lobe atelectasis unchanged. Negative for heart failure. Extensive atherosclerotic calcification IMPRESSION: Tiny left apical pneumothorax following left chest tube removal. Left lower lobe airspace disease and left  effusion unchanged Electronically Signed   By: Marlan Palau M.D.   On: 01/16/2022 09:01   DG CHEST PORT 1 VIEW  Result Date: 01/15/2022 CLINICAL DATA:  An 86 year old male present dense for evaluation of LEFT-sided chest tube. EXAM: PORTABLE CHEST 1 VIEW COMPARISON:  January 13, 2022 FINDINGS: EKG leads project over the chest. Trachea it is midline accounting for slight rotation to the LEFT. Cardiomediastinal contours and hilar structures are stable. LEFT-sided chest tube remains in place. Improved aeration at the LEFT lung base. Improved interstitial and airspace opacities about the LEFT lung base since previous imaging. Persistent lucency about the LEFT-sided chest tube may reflect tiny basilar loculated pneumothorax about the chest tube. No apical or lateral component. RIGHT chest is clear. On limited assessment no acute skeletal findings. IMPRESSION: 1. LEFT-sided chest tube with improved aeration at the LEFT lung base. Still with some LEFT basilar airspace disease. 2. Persistent lucency about  the LEFT-sided chest tube may reflect tiny basilar loculated pneumothorax about the chest tube. Attention on follow-up. Electronically Signed   By: Donzetta Kohut M.D.   On: 01/15/2022 08:52   DG CHEST PORT 1 VIEW  Result Date: 01/13/2022 CLINICAL DATA:  Shortness of breath and chest pain. EXAM: PORTABLE CHEST 1 VIEW COMPARISON:  01/11/2022 FINDINGS: The left chest tube is again noted overlying the left lower lung. Small residual left pleural effusion is unchanged. No pneumothorax. Stable cardiomediastinal contours. There are asymmetric opacities in the left midlung and left base which appears similar to previous exam. Right lung is clear. Left rib fractures are again noted. IMPRESSION: 1. No change in aeration a left midlung and left base. 2. Stable left chest tube. No pneumothorax. Small residual left pleural effusion is unchanged. Electronically Signed   By: Signa Kell M.D.   On: 01/13/2022 08:20    ECHOCARDIOGRAM LIMITED  Result Date: 01/12/2022    ECHOCARDIOGRAM LIMITED REPORT   Patient Name:   Eugene [name] Date of Exam: 01/12/2022 Medical Rec #:  [id number]          Height:       72.0 in Accession #:    [id number]         Weight:       184.3 lb Date of Birth:  1935/07/15          BSA:          2.058 m Patient Age:    86 years           BP:           112/62 mmHg Patient Gender: M                  HR:           112 bpm. Exam Location:  Inpatient Procedure: Limited Echo, Color Doppler and Cardiac Doppler Indications:    Elevated Troponins  History:        Patient has prior history of Echocardiogram examinations, most                 recent 08/25/2021. Arrythmias:Atrial Fibrillation; Risk                 Factors:Hypertension and Dyslipidemia.  Sonographer:    Irving Burton Senior RDCS Referring Phys: [id number] JENNIFER YATES  Sonographer Comments: Scanned upright with left sided chest tube in place. IMPRESSIONS  1. Left ventricular ejection fraction, by estimation, is 60 to 65%. The left ventricle has normal function.  2. Right ventricular systolic function is normal. The right ventricular size is normal.  3. No evidence of mitral valve regurgitation.  4. The aortic valve is calcified. Conclusion(s)/Recommendation(s): LV function is stable. Cannot fully assess PASP. FINDINGS  Left Ventricle: Left ventricular ejection fraction, by estimation, is 60 to 65%. The left ventricle has normal function. Right Ventricle: The right ventricular size is normal. Right ventricular systolic function is normal. Pericardium: Trivial pericardial effusion is present. Tricuspid Valve: Tricuspid valve regurgitation is mild. Aortic Valve: The aortic valve is calcified. TRICUSPID VALVE TR Peak grad:   40.7 mmHg TR Vmax:        319.00 cm/s Carolan Clines Electronically signed by Carolan Clines Signature Date/Time: 01/12/2022/9:51:25 AM    Final    US RENAL  Result Date: 01/11/2022 CLINICAL DATA:  Acute kidney injury EXAM: RENAL / URINARY TRACT  ULTRASOUND COMPLETE COMPARISON:  None Available. FINDINGS: Limited exam due to patient condition. Right Kidney: Renal measurements: 11.0 x  6.7 x 4.9 cm = volume: 187 mL. Echogenicity within normal limits. No mass or hydronephrosis visualized. Simple appearing cyst of the lower pole of the right kidney measuring up to 2.8 cm. Left Kidney: Renal measurements: 10.2 x 4.3 x 4.7 cm = volume: 108 mL. Echogenicity within normal limits. No mass or hydronephrosis visualized. Simple appearing cyst of the lower pole of the left kidney measuring up to 2.8 cm. Bladder: Appears normal for degree of bladder distention. Other: Prostatomegaly, measuring up to 0.9 cm. IMPRESSION: 1. No evidence of hydronephrosis. 2. Bilateral simple renal cysts. Electronically Signed   By: Allegra Lai M.D.   On: 01/11/2022 20:33   DG Chest Port 1 View  Result Date: 01/11/2022 CLINICAL DATA:  Left hemothorax EXAM: PORTABLE CHEST 1 VIEW COMPARISON:  Previous studies including the examination done earlier today FINDINGS: There is interval placement of left chest tube with marked decrease in left pleural effusion. The small residual left pleural effusion with blunting of left lateral CP angle. There is no pneumothorax. Cardiac size is in the upper limits of normal. There are no signs of alveolar pulmonary edema. There is slight prominence of interstitial markings in left lung which may be due to resolving atelectasis. Fractures are noted in the posterolateral aspects of left seventh and eighth ribs. There may be other adjacent fractures which are not distinctly visualized. IMPRESSION: There is marked interval decrease in left pleural effusion after placement of left chest tube. There is small residual left pleural effusion. Increased interstitial markings in the left parahilar region and left lower lung field may suggest underlying interstitial pneumonia or resolving atelectasis. There is no pneumothorax. Electronically Signed   By: [name] M.D.   On: 01/11/2022 17:20   DG Chest Portable 1 View  Result Date: 01/11/2022 CLINICAL DATA:  Shortness of breath EXAM: PORTABLE CHEST 1 VIEW COMPARISON:  08/28/2021 FINDINGS: Cardiomegaly with rightward shift of the mediastinal contents. Large left pleural effusion with near complete atelectasis or consolidation of the left lung. The right lung is normally aerated. IMPRESSION: 1. Large left pleural effusion with near complete atelectasis or consolidation of the left lung. 2.  Cardiomegaly. Electronically Signed   By: Jearld Lesch M.D.   On: 01/11/2022 12:04    Labs:  Basic Metabolic Panel: Recent Labs  Lab 01/22/22 0619 01/23/22 0508 01/25/22 0510 01/26/22 0802  NA 139 137 137 137  K 3.4* 3.9 3.8 3.5  CL 102 102 102 100  CO2 28 26 26 28   GLUCOSE 96 99 91 115*  BUN 22 23 15 17   CREATININE 1.15 1.29* 1.36* 1.32*  CALCIUM 8.7* 8.6* 8.9 9.1    CBC: Recent Labs  Lab 01/22/22 0619  WBC 5.7  NEUTROABS 4.0  HGB 11.8*  HCT 35.7*  MCV 93.9  PLT 346    CBG: No results for input(s): "GLUCAP" in the last 168 hours.  Brief HPI:   Eugene [name] is a 86 y.o. male ***   Hospital Course: Eugene [name] was admitted to rehab 01/17/2022 for inpatient therapies to consist of PT, ST and OT at least three hours five days a week. Past admission physiatrist, therapy team and rehab RN have worked together to provide customized collaborative inpatient rehab.   Blood pressures were monitored on TID basis and   Diabetes has been monitored with ac/hs CBG checks and SSI was use prn for tighter BS control.     Rehab course: During patient's stay in rehab weekly team conferences were held  to monitor patient's progress, set goals and discuss barriers to discharge. At admission, patient required  He has had improvement in activity tolerance, balance, postural control as well as ability to compensate for deficits.      Discharge disposition: 01-Home or Self  Care        Diet:  Special Instructions:   Allergies as of 01/26/2022       Reactions   Chlorhexidine Gluconate Itching   Don't use Chloraprep inside port a cath kits.   Macrolides And Ketolides Nausea And Vomiting   Tape Other (See Comments)   Adhesive tape,   Only use paper tape        Medication List     STOP taking these medications    fluticasone 50 MCG/ACT nasal spray Commonly known as: FLONASE   furosemide 40 MG tablet Commonly known as: LASIX   loperamide 2 MG tablet Commonly known as: IMODIUM A-D   metoprolol tartrate 25 MG tablet Commonly known as: LOPRESSOR   oxyCODONE 5 MG immediate release tablet Commonly known as: Oxy IR/ROXICODONE   oxymetazoline 0.05 % nasal spray Commonly known as: AFRIN   VITAMIN E PO       TAKE these medications    acetaminophen 325 MG tablet Commonly known as: TYLENOL Take 2 tablets (650 mg total) by mouth 4 (four) times daily -  with meals and at bedtime. What changed:  when to take this reasons to take this   ALPRAZolam 1 MG tablet Commonly known as: XANAX Take 0.5 tablets (0.5 mg total) by mouth 2 (two) times daily. For anxiety   apixaban 5 MG Tabs tablet Commonly known as: ELIQUIS Take 1 tablet (5 mg total) by mouth 2 (two) times daily.   atorvastatin 20 MG tablet Commonly known as: LIPITOR Take 1 tablet (20 mg total) by mouth daily.   diclofenac Sodium 1 % Gel Commonly known as: VOLTAREN Apply 2 g topically 4 (four) times daily as needed (left shoulder pain).   docusate sodium 100 MG capsule Commonly known as: COLACE Take 1 capsule (100 mg total) by mouth 2 (two) times daily.   ferrous sulfate 325 (65 FE) MG tablet Take 1 tablet (325 mg total) by mouth daily with breakfast.   levETIRAcetam 500 MG tablet Commonly known as: KEPPRA Take 1 tablet (500 mg total) by mouth 2 (two) times daily.   metoprolol succinate 100 MG 24 hr tablet Commonly known as: TOPROL-XL Take 1 tablet (100 mg total)  by mouth daily. Take with or immediately following a meal.   multivitamin with minerals Tabs tablet Take 1 tablet by mouth daily.   omeprazole 20 MG tablet Commonly known as: PRILOSEC OTC Take 1 tablet (20 mg total) by mouth daily.   polyethylene glycol 17 g packet Commonly known as: MIRALAX / GLYCOLAX Take 17 g by mouth daily as needed for mild constipation.   QUEtiapine 25 MG tablet Commonly known as: SEROQUEL Take 0.5 tablets (12.5 mg total) by mouth at bedtime.   tamsulosin 0.4 MG Caps capsule Commonly known as: FLOMAX Take 1 capsule (0.4 mg total) by mouth at bedtime.   VITAMIN B-1 PO Take 1 tablet by mouth in the morning.   VITAMIN C PO Take 1 tablet by mouth in the morning.   VITAMIN D-3 PO Take 1 capsule by mouth in the morning.   ZINC OXIDE EX Apply 1 application  topically See admin instructions. Apply topically diaper area every time pt uses bathroom as a skin barrier  Follow-up Information     [name], MD. Call.   Specialty: Internal Medicine Why: Call today or monday for post hospital follow up Contact information: [address], [address] [place] [address] [phone]         Fanny Dance, MD. Call.   Specialty: Physical Medicine and Rehabilitation Why: As needed Contact information: [address] [place] [address] [phone]                 Signed: Jacquelynn Cree 01/26/2022, 10:24 AM

## 2022-01-26 NOTE — Progress Notes (Signed)
PROGRESS NOTE   Subjective/Complaints: No new concerns this AM. He is happy to go home today.   Review of Systems  Constitutional:  Negative for chills, fever and weight loss.  Eyes:  Negative for double vision.  Respiratory:  Negative for cough and shortness of breath.   Cardiovascular:  Negative for chest pain and palpitations.  Gastrointestinal: Negative.  Negative for abdominal pain and constipation.  Genitourinary: Negative.   Neurological:  Positive for weakness.    Objective:   No results found. No results for input(s): "WBC", "HGB", "HCT", "PLT" in the last 72 hours.  Recent Labs    01/25/22 0510  NA 137  K 3.8  CL 102  CO2 26  GLUCOSE 91  BUN 15  CREATININE 1.36*  CALCIUM 8.9     Intake/Output Summary (Last 24 hours) at 01/26/2022 0755 Last data filed at 01/25/2022 2120 Gross per 24 hour  Intake 1396 ml  Output --  Net 1396 ml      Pressure Injury 01/11/22 Coccyx Medial Deep Tissue Pressure Injury - Purple or maroon localized area of discolored intact skin or blood-filled blister due to damage of underlying soft tissue from pressure and/or shear. DTI sacrum (Active)  01/11/22 2235  Location: Coccyx  Location Orientation: Medial  Staging: Deep Tissue Pressure Injury - Purple or maroon localized area of discolored intact skin or blood-filled blister due to damage of underlying soft tissue from pressure and/or shear.  Wound Description (Comments): DTI sacrum  Present on Admission: Yes    Physical Exam: Vital Signs Blood pressure 123/86, pulse 88, temperature 97.6 F (36.4 C), resp. rate 16, height 6' (1.829 m), weight 83 kg, SpO2 97 %.    General: awake, alert, appropriate, sitting up in bedside chair; frail appearing, but tall; NAD HENT: conjugate gaze; oropharynx moist CV: regular rate; no JVD Pulmonary: CTA B/L; no W/R/R- good air movement GI: soft, NT, ND, (+)BS Psychiatric:  appropriate Neurological: Ox3- alert  Ext: no clubbing, cyanosis, or edema Psych: pleasant and cooperative  Skin: sacral wound not visualized today Neurologic: Cranial nerves II through XII intact, motor strength is 4/5 in bilateral deltoid, bicep, tricep, grip, hip flexor, knee extensors, ankle dorsiflexor and plantar flexor Sensory exam normal sensation to light touch and proprioception in bilateral upper and lower extremities Cerebellar exam normal finger to nose to finger as well as heel to shin in bilateral upper and lower extremities Musculoskeletal: Full range of motion in all 4 extremities. No joint swelling. Neck and shoulder discomfort in sitting position, head forward posture    Assessment/Plan: 1. Functional deficits which require 3+ hours per day of interdisciplinary therapy in a comprehensive inpatient rehab setting. Physiatrist is providing close team supervision and 24 hour management of active medical problems listed below. Physiatrist and rehab team continue to assess barriers to discharge/monitor patient progress toward functional and medical goals  Care Tool:  Bathing    Body parts bathed by patient: Right arm, Left arm, Chest, Abdomen, Front perineal area, Buttocks, Right upper leg, Left upper leg, Face, Right lower leg, Left lower leg   Body parts bathed by helper: Right lower leg, Left lower leg     Bathing assist  Assist Level: Supervision/Verbal cueing     Upper Body Dressing/Undressing Upper body dressing   What is the patient wearing?: Pull over shirt    Upper body assist Assist Level: Supervision/Verbal cueing    Lower Body Dressing/Undressing Lower body dressing      What is the patient wearing?: Pants, Incontinence brief     Lower body assist Assist for lower body dressing: Minimal Assistance - Patient > 75%     Toileting Toileting Toileting Activity did not occur (Clothing management and hygiene only): N/A (no void or bm)  Toileting  assist Assist for toileting: Supervision/Verbal cueing     Transfers Chair/bed transfer  Transfers assist  Chair/bed transfer activity did not occur: Safety/medical concerns  Chair/bed transfer assist level: Supervision/Verbal cueing     Locomotion Ambulation   Ambulation assist   Ambulation activity did not occur: Safety/medical concerns (Unable to perform ambulation and stair mobility secondary to poor endurance/activity tolerance and global weakness.)  Assist level: Supervision/Verbal cueing Assistive device: Walker-rolling Max distance: 150   Walk 10 feet activity   Assist  Walk 10 feet activity did not occur: Safety/medical concerns  Assist level: Supervision/Verbal cueing Assistive device: Walker-rolling   Walk 50 feet activity   Assist Walk 50 feet with 2 turns activity did not occur: Safety/medical concerns  Assist level: Supervision/Verbal cueing Assistive device: Walker-rolling    Walk 150 feet activity   Assist Walk 150 feet activity did not occur: Safety/medical concerns  Assist level: Supervision/Verbal cueing Assistive device: Walker-rolling    Walk 10 feet on uneven surface  activity   Assist Walk 10 feet on uneven surfaces activity did not occur: Safety/medical concerns   Assist level: Supervision/Verbal cueing Assistive device: Walker-rolling   Wheelchair     Assist Is the patient using a wheelchair?: No Type of Wheelchair: Manual    Wheelchair assist level: Total Assistance - Patient < 25% Max wheelchair distance: Patient able to propel ~20' and then required total assistance for the remaining 130'    Wheelchair 50 feet with 2 turns activity    Assist        Assist Level: Maximal Assistance - Patient 25 - 49%   Wheelchair 150 feet activity     Assist      Assist Level: Total Assistance - Patient < 25%   Blood pressure 123/86, pulse 88, temperature 97.6 F (36.4 C), resp. rate 16, height 6' (1.829 m),  weight 83 kg, SpO2 97 %.      Medical Problem List and Plan: 1. Functional deficits secondary to pulmonary debility             -patient may shower             -ELOS/Goals: 10-14 days             -Continue CIR  -Will consult SLP due to coughing with drinking water for swallow eval  -Interdisciplinary Team Conference today Con't CIR- PT and OT- d/c today 2.  Antithrombotics: -DVT/anticoagulation:  Mechanical: Sequential compression devices, below knee Bilateral lower extremities -Eliquis started by cardiology             -antiplatelet therapy: N/A 3. L shoulder pain: add voltaren gel prn. Oxycodone prn.   8/31- denies pain- con't regimen prn 4. Mood/Behavior/Sleep: LCSW to follow for evaluation and support.              -antipsychotic agents: N/A 5. Neuropsych/cognition: This patient is capable of making decisions on his own behalf. 6. Skin/Wound Care: Routine  pressure relief measures.              --foam dressing to DTI w/chronic tissue damage.              --continue nutritional supplements.  7. Fluids/Electrolytes/Nutrition: Monitor I/O. Check CMET in am             --continue nutritional supplements.  8/31- Cr up slightly to 1.35 but BUN down to 15- so drinking more- lips/oropharynx still dry- push fluids-  8. Left hemothorax: has resolved. Pulmonary hygiene.             --follow up with Pulmonary in 5 weeks w/CxR. 9. A fib: Monitor HR TID--continue Metoprolol BID  -appears to be mildly tachycardic chronically, continue to monitor, consider increase dose metoprolol  -Afib with RVR noted, Cardiology started metoprolol XL 100mg  Daily(can reduce to 8ml XL if BP dont tolerate), Eliquis 5mg  started by cardiology, appreciate cardiology assistance  -9/1 HR stable in 80s today 10. ABLA: Stable overall. Recheck CBC in am.             --h/o hemochromatosis.       Latest Ref Rng & Units 01/22/2022    6:19 AM 01/19/2022    6:21 AM 01/18/2022    5:29 AM  CBC  WBC 4.0 - 10.5 K/uL 5.7   8.8  9.0   Hemoglobin 13.0 - 17.0 g/dL 01/21/2022  01/20/2022  16.1   Hematocrit 39.0 - 52.0 % 35.7  32.4  30.6   Platelets 150 - 400 K/uL 346  233  214    Slowly improving , normochromic normocytic  11. H/o depression w/anxiety: Continue Xanax 0.5 mg bid w/ Seroquel 25 mg/HS.  -8/26 Seroquel was decreased to 12.5mg /HS due to sedation 12. H/o Seizure d/o: Continue Keppra bid.  13. Acute on chronic renal failure: Baseline SCr 1.27-1.3. Lasix on hold.                 Latest Ref Rng & Units 01/25/2022    5:10 AM 01/23/2022    5:08 AM 01/22/2022    6:19 AM  BMP  Glucose 70 - 99 mg/dL 91  99  96   BUN 8 - 23 mg/dL 15  23  22    Creatinine 0.61 - 1.24 mg/dL 01/25/2022  01/24/2022    Sodium 135 - 145 mmol/L 137  137  139   Potassium 3.5 - 5.1 mmol/L 3.8  3.9  3.4   Chloride 98 - 111 mmol/L 102  102  102   CO2 22 - 32 mmol/L 26  26  28    Calcium 8.9 - 10.3 mg/dL 8.9  8.6  8.7    Fairly stable Cr although slight incr 8/29.  may resume Lasix if needed .  No LE edema. Chest generally clear with decr sounds at bases  Repeat BMP tomorrow  8/31- Cr 1.36- in range of chronic Cr and BUN better at 15 from 23- con't to push fluids  9/1 Cr slightly decreased to 1.32, continue to encourage oral fluids 14. Airspace disease on CXR: incentive spirometer ordered. 15. Pleural effusion: daily weights ordered to monitor for fluid overload while off of Lasix.   -weight continues to be stable Filed Weights   01/25/22 0500 01/26/22 0500 01/26/22 0700  Weight: 83 kg 82 kg 83 kg      17. Fatigue: ordered iron, B12, and folate with morning labs 18. Neck pain, suspect from increased use with therapy  -re-ordered kpad today, discussed posture.  19.  HypoK+ mild, likely nutritional , no diuretics  oral supplement x 1 - -K+ 3.5 9/1  20.  GERD- increase protonix to 40mg  BID, up in chair for meals  21. Dysphagia:  8/29 -MBS performed yesterday by SLP which demonstrated mild oropharyngeal dysphagia. Also appears to have  esophageal component (retrograde bolus flow thru cervical esophagus).   -diet adjusted to D2/thins, meds crushed or whole in puree  -slp to follow and monitor for progress as he gets stronger.   -might benefit from GI assessment given esophageal component. Pt does not want to pursue at this point however.   Increase Protonix to 40mg  BID    LOS: 9 days A FACE TO FACE EVALUATION WAS PERFORMED  9/29 01/26/2022, 7:55 AM

## 2022-01-26 NOTE — Progress Notes (Signed)
Patient discharged to home, accompanied by his son. 

## 2022-02-17 ENCOUNTER — Other Ambulatory Visit: Payer: Self-pay | Admitting: Physical Medicine and Rehabilitation

## 2022-02-18 ENCOUNTER — Other Ambulatory Visit: Payer: Self-pay | Admitting: Physical Medicine and Rehabilitation

## 2022-02-20 NOTE — Telephone Encounter (Signed)
-   Refill colace

## 2022-02-25 ENCOUNTER — Other Ambulatory Visit: Payer: Self-pay | Admitting: Physical Medicine and Rehabilitation

## 2022-02-28 ENCOUNTER — Other Ambulatory Visit: Payer: Self-pay | Admitting: *Deleted

## 2022-02-28 ENCOUNTER — Ambulatory Visit (INDEPENDENT_AMBULATORY_CARE_PROVIDER_SITE_OTHER): Payer: Medicare Other

## 2022-02-28 ENCOUNTER — Encounter: Payer: Self-pay | Admitting: Adult Health

## 2022-02-28 ENCOUNTER — Ambulatory Visit (INDEPENDENT_AMBULATORY_CARE_PROVIDER_SITE_OTHER): Payer: Medicare Other | Admitting: Adult Health

## 2022-02-28 VITALS — BP 124/60 | HR 91 | Temp 97.4°F | Ht 65.0 in | Wt 188.2 lb

## 2022-02-28 DIAGNOSIS — J90 Pleural effusion, not elsewhere classified: Secondary | ICD-10-CM

## 2022-02-28 DIAGNOSIS — I5040 Unspecified combined systolic (congestive) and diastolic (congestive) heart failure: Secondary | ICD-10-CM

## 2022-02-28 DIAGNOSIS — Z8709 Personal history of other diseases of the respiratory system: Secondary | ICD-10-CM

## 2022-02-28 DIAGNOSIS — I509 Heart failure, unspecified: Secondary | ICD-10-CM | POA: Insufficient documentation

## 2022-02-28 DIAGNOSIS — J939 Pneumothorax, unspecified: Secondary | ICD-10-CM | POA: Diagnosis not present

## 2022-02-28 DIAGNOSIS — D649 Anemia, unspecified: Secondary | ICD-10-CM | POA: Insufficient documentation

## 2022-02-28 NOTE — Assessment & Plan Note (Signed)
Cont follow up with PCP and labs today

## 2022-02-28 NOTE — Progress Notes (Signed)
Received flu vaccine 3 weeks ago at Dr. Sherilyn Cooter office.

## 2022-02-28 NOTE — Patient Instructions (Addendum)
Activity as tolerated.  Follow up in 4 weeks with chest xray with Dr. Shearon Stalls or Brennley Curtice NP  and As needed

## 2022-02-28 NOTE — Assessment & Plan Note (Signed)
Appears stable cont follow up with cardiology

## 2022-02-28 NOTE — Progress Notes (Signed)
@Patient  ID: , male    DOB: 1935/06/26, 86 y.o.   MRN: [id number]  Chief Complaint  Patient presents with   Hospitalization Follow-up    Referring provider: [id number], MD  HPI: 86 year old male former smoker seen for pulmonary consult during recent hospitalization August 2023 for large left hemothorax Medical history significant for chronic CHF, A-fib, AAA status post endovascular repair, hemochromatosis, history of seizure disorder  TEST/EVENTS :   02/28/2022 Follow up: Post hospital follow-up, left pleural effusion Patient presents for a post hospital follow-up.  Patient was recently admitted to the hospital with a prolonged hospitalization.  He was found to have a large left hemothorax with volume overload, acute blood loss anemia.  This occurred after a fall resulting in trauma to the left chest wall with left hemothorax.  left chest tube was placed with 2.5 L of bloody fluid removed.  He did require 3 units of packed red blood cells with associated hypotension and hypoxemia.  His Eliquis was discontinued.  Hospital stay complicated by consumptive thrombocytopenia.  Patient was admitted to inpatient rehab.  Now has been discharged to home. Lives at home . Has caregiver that comes in.  Patient is very sedentary.  Uses a walker to get around.  Usually sits in his chair most of the day. Since discharge patient is feeling better. Breathing is doing well. No cough.  Your good okay Started on back on Eliquis due to A Fib with RVR. Getting labs today .  Denies any hemoptysis.   Allergies  Allergen Reactions   Chlorhexidine Gluconate Itching    Don't use Chloraprep inside port a cath kits.   Macrolides And Ketolides Nausea And Vomiting   Tape Other (See Comments)    Adhesive tape,   Only use paper tape    Immunization History  Administered Date(s) Administered   Influenza, High Dose Seasonal PF 03/14/2017, 03/18/2018   PFIZER(Purple Top)SARS-COV-2 Vaccination  07/03/2019, 07/28/2019   Zoster Recombinat (Shingrix) 07/23/2018, 11/25/2018    Past Medical History:  Diagnosis Date   AAA (abdominal aortic aneurysm) (HCC) 06/29/2007   stent graft   Aortic stenosis 09/05/2017   Mild AS/AR by echo 01/2017.   GERD (gastroesophageal reflux disease)    Hemochromatosis    Hyperlipidemia    Hypertension    Irritable bowel syndrome    Permanent atrial fibrillation (HCC)    Stroke (HCC)    Transient Ischemic Attack    Tobacco History: Social History   Tobacco Use  Smoking Status Former   Packs/day: 1.00   Years: 40.00   Total pack years: 40.00   Types: Cigarettes   Quit date: 06/18/1999   Years since quitting: 22.7  Smokeless Tobacco Never   Counseling given: Not Answered   Outpatient Medications Prior to Visit  Medication Sig Dispense Refill   acetaminophen (TYLENOL) 325 MG tablet Take 2 tablets (650 mg total) by mouth 4 (four) times daily -  with meals and at bedtime.     ALPRAZolam (XANAX) 1 MG tablet Take 0.5 tablets (0.5 mg total) by mouth 2 (two) times daily. For anxiety 14 tablet 0   apixaban (ELIQUIS) 5 MG TABS tablet Take 1 tablet (5 mg total) by mouth 2 (two) times daily. 60 tablet 0   Ascorbic Acid (VITAMIN C PO) Take 1 tablet by mouth in the morning.     atorvastatin (LIPITOR) 20 MG tablet Take 1 tablet (20 mg total) by mouth daily. 90 tablet 3   Cholecalciferol (VITAMIN D-3 PO) Take  1 capsule by mouth in the morning.     diclofenac Sodium (VOLTAREN) 1 % GEL Apply 2 g topically 4 (four) times daily as needed (left shoulder pain). 350 g 1   docusate sodium (COLACE) 100 MG capsule TAKE 1 CAPSULE BY MOUTH TWICE A DAY 60 capsule 0   ferrous sulfate 325 (65 FE) MG tablet Take 1 tablet (325 mg total) by mouth daily with breakfast. 30 tablet 3   levETIRAcetam (KEPPRA) 500 MG tablet Take 1 tablet (500 mg total) by mouth 2 (two) times daily. 60 tablet 1   metoprolol succinate (TOPROL-XL) 100 MG 24 hr tablet Take 1 tablet (100 mg total) by  mouth daily. Take with or immediately following a meal. 30 tablet 0   Multiple Vitamin (MULTIVITAMIN WITH MINERALS) TABS tablet Take 1 tablet by mouth daily.     omeprazole (PRILOSEC OTC) 20 MG tablet Take 1 tablet (20 mg total) by mouth daily. 90 tablet 3   polyethylene glycol (MIRALAX / GLYCOLAX) 17 g packet Take 17 g by mouth daily as needed for mild constipation. 14 each 0   QUEtiapine (SEROQUEL) 25 MG tablet Take 0.5 tablets (12.5 mg total) by mouth at bedtime. 15 tablet 0   tamsulosin (FLOMAX) 0.4 MG CAPS capsule Take 1 capsule (0.4 mg total) by mouth at bedtime. 30 capsule 0   Thiamine HCl (VITAMIN B-1 PO) Take 1 tablet by mouth in the morning.     ZINC OXIDE EX Apply 1 application  topically See admin instructions. Apply topically diaper area every time pt uses bathroom as a skin barrier     Facility-Administered Medications Prior to Visit  Medication Dose Route Frequency Provider Last Rate Last Admin   alteplase (CATHFLO ACTIVASE) injection 2 mg  2 mg Intracatheter Once PRN Magrinat, Virgie Dad, MD       sodium chloride 0.9 % injection 10 mL  10 mL Intravenous PRN Magrinat, Virgie Dad, MD   10 mL at 11/25/14 1220     Review of Systems:   Constitutional:   No  weight loss, night sweats,  Fevers, chills,  +fatigue, or  lassitude.  HEENT:   No headaches,  Difficulty swallowing,  Tooth/dental problems, or  Sore throat,                No sneezing, itching, ear ache, nasal congestion, post nasal drip,   CV:  No chest pain,  Orthopnea, PND, +swelling in lower extremities, anasarca, dizziness, palpitations, syncope.   GI  No heartburn, indigestion, abdominal pain, nausea, vomiting, diarrhea, change in bowel habits, loss of appetite, bloody stools.   Resp:   No chest wall deformity  Skin: no rash or lesions.  GU: no dysuria, change in color of urine, no urgency or frequency.  No flank pain, no hematuria   MS:  No joint pain or swelling.  No decreased range of motion.  No back  pain.    Physical Exam  BP 124/60 (BP Location: Left Arm, Patient Position: Sitting, Cuff Size: Normal)   Pulse 91   Temp (!) 97.4 F (36.3 C) (Oral)   Ht 5\' 5"  (1.651 m)   Wt 188 lb 3.2 oz (85.4 kg)   SpO2 92%   BMI 31.32 kg/m   GEN: A/Ox3; pleasant , NAD, well nourished, elderly , walker    HEENT:  [place]/AT,  NOSE-clear, THROAT-clear, no lesions, no postnasal drip or exudate noted.   NECK:  Supple w/ fair ROM; no JVD; normal carotid impulses w/o bruits; no thyromegaly  or nodules palpated; no lymphadenopathy.    RESP  Clear  P & A; w/o, wheezes/ rales/ or rhonchi. no accessory muscle use, no dullness to percussion  CARD:  RRR, no m/r/g, 1+ peripheral edema, pulses intact, no cyanosis or clubbing.  GI:   Soft & nt; nml bowel sounds; no organomegaly or masses detected.   Musco: Warm bil, no deformities or joint swelling noted.   Neuro: alert, no focal deficits noted.    Skin: Warm, no lesions or rashes    Lab Results:  CBC   BNP   ProBNP No results found for: "PROBNP"  Imaging: DG Chest 2 View  Result Date: 02/28/2022 CLINICAL DATA:  Follow-up pleural effusion EXAM: CHEST - 2 VIEW COMPARISON:  Chest x-ray dated August 25th 2023 FINDINGS: Cardiac and mediastinal contours are unchanged. Unchanged moderate left pleural effusion. Bibasilar atelectasis. No evidence of pneumothorax. IMPRESSION: Unchanged moderate left pleural effusion. Electronically Signed   By: Allegra Lai M.D.   On: 02/28/2022 11:46          No data to display          No results found for: "NITRICOXIDE"      Assessment & Plan:   Pleural effusion Recent left hemothorax after traumatic fall.  Patient required chest tube and prolonged hospitalization.  Eliquis was held during hospitalization and restarted at discharge. Today's chest x-ray shows a stable moderate pleural effusion.  Patient clinically is improving and has no significant shortness of breath.  We will repeat x-ray in 4  weeks if continues to be moderate size may need to consider thoracentesis.  Plan  Patient Instructions  Activity as tolerated.  Follow up in 4 weeks with chest xray with Dr. Celine Mans or [place] NP  and As needed        CHF (congestive heart failure) (HCC) Appears stable cont follow up with cardiology   Anemia Cont follow up with PCP and labs today     Rubye Oaks, NP 02/28/2022

## 2022-02-28 NOTE — Assessment & Plan Note (Signed)
Recent left hemothorax after traumatic fall.  Patient required chest tube and prolonged hospitalization.  Eliquis was held during hospitalization and restarted at discharge. Today's chest x-ray shows a stable moderate pleural effusion.  Patient clinically is improving and has no significant shortness of breath.  We will repeat x-ray in 4 weeks if continues to be moderate size may need to consider thoracentesis.  Plan  Patient Instructions  Activity as tolerated.  Follow up in 4 weeks with chest xray with Dr. Shearon Stalls or Landin Tallon NP  and As needed

## 2022-04-03 ENCOUNTER — Ambulatory Visit: Payer: Medicare Other | Admitting: Cardiology

## 2022-04-09 ENCOUNTER — Emergency Department (HOSPITAL_COMMUNITY): Payer: Medicare Other

## 2022-04-09 ENCOUNTER — Ambulatory Visit: Payer: Medicare Other | Admitting: Adult Health

## 2022-04-09 ENCOUNTER — Inpatient Hospital Stay (HOSPITAL_COMMUNITY): Payer: Medicare Other

## 2022-04-09 ENCOUNTER — Inpatient Hospital Stay (HOSPITAL_COMMUNITY)
Admission: EM | Admit: 2022-04-09 | Discharge: 2022-04-16 | DRG: 082 | Disposition: A | Discharge status: Rehabilitation Facility | Payer: Medicare Other | Attending: Internal Medicine | Admitting: Internal Medicine

## 2022-04-09 DIAGNOSIS — L03114 Cellulitis of left upper limb: Secondary | ICD-10-CM | POA: Diagnosis not present

## 2022-04-09 DIAGNOSIS — E876 Hypokalemia: Secondary | ICD-10-CM | POA: Diagnosis not present

## 2022-04-09 DIAGNOSIS — N183 Chronic kidney disease, stage 3 unspecified: Secondary | ICD-10-CM | POA: Diagnosis present

## 2022-04-09 DIAGNOSIS — S06359D Traumatic hemorrhage of left cerebrum with loss of consciousness of unspecified duration, subsequent encounter: Secondary | ICD-10-CM | POA: Diagnosis present

## 2022-04-09 DIAGNOSIS — J439 Emphysema, unspecified: Secondary | ICD-10-CM | POA: Diagnosis present

## 2022-04-09 DIAGNOSIS — W19XXXD Unspecified fall, subsequent encounter: Secondary | ICD-10-CM | POA: Diagnosis present

## 2022-04-09 DIAGNOSIS — Z888 Allergy status to other drugs, medicaments and biological substances status: Secondary | ICD-10-CM

## 2022-04-09 DIAGNOSIS — Z91048 Other nonmedicinal substance allergy status: Secondary | ICD-10-CM

## 2022-04-09 DIAGNOSIS — S065XAA Traumatic subdural hemorrhage with loss of consciousness status unknown, initial encounter: Secondary | ICD-10-CM | POA: Diagnosis present

## 2022-04-09 DIAGNOSIS — R1312 Dysphagia, oropharyngeal phase: Secondary | ICD-10-CM | POA: Diagnosis present

## 2022-04-09 DIAGNOSIS — G4700 Insomnia, unspecified: Secondary | ICD-10-CM | POA: Diagnosis not present

## 2022-04-09 DIAGNOSIS — S066XAA Traumatic subarachnoid hemorrhage with loss of consciousness status unknown, initial encounter: Secondary | ICD-10-CM | POA: Diagnosis not present

## 2022-04-09 DIAGNOSIS — K219 Gastro-esophageal reflux disease without esophagitis: Secondary | ICD-10-CM | POA: Diagnosis present

## 2022-04-09 DIAGNOSIS — E785 Hyperlipidemia, unspecified: Secondary | ICD-10-CM | POA: Diagnosis present

## 2022-04-09 DIAGNOSIS — F05 Delirium due to known physiological condition: Secondary | ICD-10-CM | POA: Diagnosis not present

## 2022-04-09 DIAGNOSIS — R471 Dysarthria and anarthria: Secondary | ICD-10-CM | POA: Diagnosis present

## 2022-04-09 DIAGNOSIS — G510 Bell's palsy: Secondary | ICD-10-CM | POA: Diagnosis present

## 2022-04-09 DIAGNOSIS — J9601 Acute respiratory failure with hypoxia: Secondary | ICD-10-CM | POA: Diagnosis not present

## 2022-04-09 DIAGNOSIS — L89312 Pressure ulcer of right buttock, stage 2: Secondary | ICD-10-CM | POA: Diagnosis present

## 2022-04-09 DIAGNOSIS — S069X0S Unspecified intracranial injury without loss of consciousness, sequela: Secondary | ICD-10-CM | POA: Diagnosis not present

## 2022-04-09 DIAGNOSIS — K589 Irritable bowel syndrome without diarrhea: Secondary | ICD-10-CM | POA: Diagnosis present

## 2022-04-09 DIAGNOSIS — I5032 Chronic diastolic (congestive) heart failure: Secondary | ICD-10-CM | POA: Diagnosis present

## 2022-04-09 DIAGNOSIS — R5381 Other malaise: Secondary | ICD-10-CM | POA: Diagnosis present

## 2022-04-09 DIAGNOSIS — I4821 Permanent atrial fibrillation: Secondary | ICD-10-CM | POA: Diagnosis present

## 2022-04-09 DIAGNOSIS — Z79899 Other long term (current) drug therapy: Secondary | ICD-10-CM

## 2022-04-09 DIAGNOSIS — I609 Nontraumatic subarachnoid hemorrhage, unspecified: Secondary | ICD-10-CM | POA: Diagnosis present

## 2022-04-09 DIAGNOSIS — Z7901 Long term (current) use of anticoagulants: Secondary | ICD-10-CM

## 2022-04-09 DIAGNOSIS — G9349 Other encephalopathy: Secondary | ICD-10-CM | POA: Diagnosis present

## 2022-04-09 DIAGNOSIS — I7143 Infrarenal abdominal aortic aneurysm, without rupture: Secondary | ICD-10-CM | POA: Diagnosis present

## 2022-04-09 DIAGNOSIS — M19012 Primary osteoarthritis, left shoulder: Secondary | ICD-10-CM | POA: Diagnosis not present

## 2022-04-09 DIAGNOSIS — Z8249 Family history of ischemic heart disease and other diseases of the circulatory system: Secondary | ICD-10-CM

## 2022-04-09 DIAGNOSIS — R296 Repeated falls: Secondary | ICD-10-CM | POA: Diagnosis present

## 2022-04-09 DIAGNOSIS — N390 Urinary tract infection, site not specified: Secondary | ICD-10-CM | POA: Diagnosis present

## 2022-04-09 DIAGNOSIS — I350 Nonrheumatic aortic (valve) stenosis: Secondary | ICD-10-CM | POA: Diagnosis present

## 2022-04-09 DIAGNOSIS — G8929 Other chronic pain: Secondary | ICD-10-CM | POA: Diagnosis not present

## 2022-04-09 DIAGNOSIS — M25512 Pain in left shoulder: Secondary | ICD-10-CM | POA: Diagnosis not present

## 2022-04-09 DIAGNOSIS — Z96612 Presence of left artificial shoulder joint: Secondary | ICD-10-CM | POA: Diagnosis not present

## 2022-04-09 DIAGNOSIS — D62 Acute posthemorrhagic anemia: Secondary | ICD-10-CM | POA: Diagnosis present

## 2022-04-09 DIAGNOSIS — R569 Unspecified convulsions: Secondary | ICD-10-CM

## 2022-04-09 DIAGNOSIS — S062X0A Diffuse traumatic brain injury without loss of consciousness, initial encounter: Secondary | ICD-10-CM | POA: Diagnosis not present

## 2022-04-09 DIAGNOSIS — Z9842 Cataract extraction status, left eye: Secondary | ICD-10-CM

## 2022-04-09 DIAGNOSIS — I130 Hypertensive heart and chronic kidney disease with heart failure and stage 1 through stage 4 chronic kidney disease, or unspecified chronic kidney disease: Secondary | ICD-10-CM | POA: Diagnosis present

## 2022-04-09 DIAGNOSIS — Z66 Do not resuscitate: Secondary | ICD-10-CM | POA: Diagnosis present

## 2022-04-09 DIAGNOSIS — F419 Anxiety disorder, unspecified: Secondary | ICD-10-CM | POA: Diagnosis present

## 2022-04-09 DIAGNOSIS — S0083XA Contusion of other part of head, initial encounter: Secondary | ICD-10-CM | POA: Diagnosis present

## 2022-04-09 DIAGNOSIS — I129 Hypertensive chronic kidney disease with stage 1 through stage 4 chronic kidney disease, or unspecified chronic kidney disease: Secondary | ICD-10-CM | POA: Diagnosis present

## 2022-04-09 DIAGNOSIS — E669 Obesity, unspecified: Secondary | ICD-10-CM | POA: Diagnosis present

## 2022-04-09 DIAGNOSIS — W19XXXA Unspecified fall, initial encounter: Secondary | ICD-10-CM | POA: Diagnosis present

## 2022-04-09 DIAGNOSIS — E440 Moderate protein-calorie malnutrition: Secondary | ICD-10-CM | POA: Diagnosis present

## 2022-04-09 DIAGNOSIS — S066X0A Traumatic subarachnoid hemorrhage without loss of consciousness, initial encounter: Secondary | ICD-10-CM | POA: Diagnosis present

## 2022-04-09 DIAGNOSIS — I472 Ventricular tachycardia, unspecified: Secondary | ICD-10-CM | POA: Diagnosis present

## 2022-04-09 DIAGNOSIS — Z87891 Personal history of nicotine dependence: Secondary | ICD-10-CM

## 2022-04-09 DIAGNOSIS — L89892 Pressure ulcer of other site, stage 2: Secondary | ICD-10-CM | POA: Diagnosis present

## 2022-04-09 DIAGNOSIS — G40909 Epilepsy, unspecified, not intractable, without status epilepticus: Secondary | ICD-10-CM | POA: Diagnosis present

## 2022-04-09 DIAGNOSIS — N179 Acute kidney failure, unspecified: Secondary | ICD-10-CM | POA: Diagnosis present

## 2022-04-09 DIAGNOSIS — J90 Pleural effusion, not elsewhere classified: Secondary | ICD-10-CM | POA: Diagnosis present

## 2022-04-09 DIAGNOSIS — S069X4S Unspecified intracranial injury with loss of consciousness of 6 hours to 24 hours, sequela: Secondary | ICD-10-CM | POA: Diagnosis not present

## 2022-04-09 DIAGNOSIS — Z6831 Body mass index (BMI) 31.0-31.9, adult: Secondary | ICD-10-CM

## 2022-04-09 DIAGNOSIS — I6931 Attention and concentration deficit following cerebral infarction: Secondary | ICD-10-CM | POA: Diagnosis not present

## 2022-04-09 DIAGNOSIS — M1612 Unilateral primary osteoarthritis, left hip: Secondary | ICD-10-CM | POA: Diagnosis not present

## 2022-04-09 DIAGNOSIS — I482 Chronic atrial fibrillation, unspecified: Secondary | ICD-10-CM | POA: Diagnosis not present

## 2022-04-09 DIAGNOSIS — G9341 Metabolic encephalopathy: Secondary | ICD-10-CM | POA: Diagnosis not present

## 2022-04-09 DIAGNOSIS — N400 Enlarged prostate without lower urinary tract symptoms: Secondary | ICD-10-CM | POA: Diagnosis present

## 2022-04-09 DIAGNOSIS — F39 Unspecified mood [affective] disorder: Secondary | ICD-10-CM | POA: Diagnosis present

## 2022-04-09 DIAGNOSIS — F03918 Unspecified dementia, unspecified severity, with other behavioral disturbance: Secondary | ICD-10-CM | POA: Diagnosis present

## 2022-04-09 DIAGNOSIS — Z9841 Cataract extraction status, right eye: Secondary | ICD-10-CM

## 2022-04-09 DIAGNOSIS — I10 Essential (primary) hypertension: Secondary | ICD-10-CM | POA: Diagnosis not present

## 2022-04-09 DIAGNOSIS — S069X1S Unspecified intracranial injury with loss of consciousness of 30 minutes or less, sequela: Secondary | ICD-10-CM | POA: Diagnosis not present

## 2022-04-09 DIAGNOSIS — J810 Acute pulmonary edema: Secondary | ICD-10-CM | POA: Diagnosis not present

## 2022-04-09 DIAGNOSIS — Z8679 Personal history of other diseases of the circulatory system: Secondary | ICD-10-CM

## 2022-04-09 DIAGNOSIS — Y95 Nosocomial condition: Secondary | ICD-10-CM | POA: Diagnosis not present

## 2022-04-09 DIAGNOSIS — R4701 Aphasia: Secondary | ICD-10-CM | POA: Diagnosis present

## 2022-04-09 DIAGNOSIS — Z8673 Personal history of transient ischemic attack (TIA), and cerebral infarction without residual deficits: Secondary | ICD-10-CM

## 2022-04-09 DIAGNOSIS — S069X4D Unspecified intracranial injury with loss of consciousness of 6 hours to 24 hours, subsequent encounter: Secondary | ICD-10-CM | POA: Diagnosis not present

## 2022-04-09 DIAGNOSIS — J189 Pneumonia, unspecified organism: Secondary | ICD-10-CM | POA: Diagnosis not present

## 2022-04-09 DIAGNOSIS — Z9181 History of falling: Secondary | ICD-10-CM

## 2022-04-09 DIAGNOSIS — Z87440 Personal history of urinary (tract) infections: Secondary | ICD-10-CM

## 2022-04-09 LAB — COMPREHENSIVE METABOLIC PANEL
ALT: 11 U/L (ref 0–44)
AST: 17 U/L (ref 15–41)
Albumin: 3.4 g/dL — ABNORMAL LOW (ref 3.5–5.0)
Alkaline Phosphatase: 72 U/L (ref 38–126)
Anion gap: 14 (ref 5–15)
BUN: 14 mg/dL (ref 8–23)
CO2: 24 mmol/L (ref 22–32)
Calcium: 9.2 mg/dL (ref 8.9–10.3)
Chloride: 95 mmol/L — ABNORMAL LOW (ref 98–111)
Creatinine, Ser: 1.13 mg/dL (ref 0.61–1.24)
GFR, Estimated: >60 mL/min (ref >60)
Glucose, Bld: 99 mg/dL (ref 70–99)
Potassium: 4.1 mmol/L (ref 3.5–5.1)
Sodium: 133 mmol/L — ABNORMAL LOW (ref 135–145)
Total Bilirubin: 1.5 mg/dL — ABNORMAL HIGH (ref 0.3–1.2)
Total Protein: 6.3 g/dL — ABNORMAL LOW (ref 6.5–8.1)

## 2022-04-09 LAB — PROTIME-INR
INR: 1.6 — ABNORMAL HIGH (ref 0.8–1.2)
Prothrombin Time: 19.3 seconds — ABNORMAL HIGH (ref 11.4–15.2)

## 2022-04-09 LAB — URINALYSIS, ROUTINE W REFLEX MICROSCOPIC
Bilirubin Urine: NEGATIVE
Glucose, UA: NEGATIVE mg/dL
Hgb urine dipstick: NEGATIVE
Ketones, ur: NEGATIVE mg/dL
Leukocytes,Ua: NEGATIVE
Nitrite: NEGATIVE
Protein, ur: NEGATIVE mg/dL
Specific Gravity, Urine: 1.009 (ref 1.005–1.030)
pH: 7 (ref 5.0–8.0)

## 2022-04-09 LAB — CBC WITH DIFFERENTIAL/PLATELET
Abs Immature Granulocytes: 0.04 10*3/uL (ref 0.00–0.07)
Basophils Absolute: 0 10*3/uL (ref 0.0–0.1)
Basophils Relative: 1 %
Eosinophils Absolute: 0.3 10*3/uL (ref 0.0–0.5)
Eosinophils Relative: 4 %
HCT: 36.8 % — ABNORMAL LOW (ref 39.0–52.0)
Hemoglobin: 12 g/dL — ABNORMAL LOW (ref 13.0–17.0)
Immature Granulocytes: 1 %
Lymphocytes Relative: 15 %
Lymphs Abs: 1.1 10*3/uL (ref 0.7–4.0)
MCH: 30.8 pg (ref 26.0–34.0)
MCHC: 32.6 g/dL (ref 30.0–36.0)
MCV: 94.6 fL (ref 80.0–100.0)
Monocytes Absolute: 0.7 10*3/uL (ref 0.1–1.0)
Monocytes Relative: 10 %
Neutro Abs: 5.5 10*3/uL (ref 1.7–7.7)
Neutrophils Relative %: 69 %
Platelets: 228 10*3/uL (ref 150–400)
RBC: 3.89 MIL/uL — ABNORMAL LOW (ref 4.22–5.81)
RDW: 14.5 % (ref 11.5–15.5)
WBC: 7.8 10*3/uL (ref 4.0–10.5)
nRBC: 0 % (ref 0.0–0.2)

## 2022-04-09 LAB — RAPID URINE DRUG SCREEN, HOSP PERFORMED
Amphetamines: NOT DETECTED
Barbiturates: NOT DETECTED
Benzodiazepines: POSITIVE — AB
Cocaine: NOT DETECTED
Opiates: NOT DETECTED
Tetrahydrocannabinol: NOT DETECTED

## 2022-04-09 LAB — AMMONIA: Ammonia: 17 umol/L (ref 9–35)

## 2022-04-09 LAB — TSH: TSH: 2.1 u[IU]/mL (ref 0.350–4.500)

## 2022-04-09 LAB — CBG MONITORING, ED: Glucose-Capillary: 94 mg/dL (ref 70–99)

## 2022-04-09 LAB — APTT: aPTT: 35 seconds (ref 24–36)

## 2022-04-09 LAB — MAGNESIUM: Magnesium: 2 mg/dL (ref 1.7–2.4)

## 2022-04-09 LAB — SALICYLATE LEVEL: Salicylate Lvl: <7 mg/dL — ABNORMAL LOW (ref 7.0–30.0)

## 2022-04-09 LAB — ACETAMINOPHEN LEVEL: Acetaminophen (Tylenol), Serum: <10 ug/mL — ABNORMAL LOW (ref 10–30)

## 2022-04-09 LAB — ETHANOL: Alcohol, Ethyl (B): <10 mg/dL (ref <10)

## 2022-04-09 MED ORDER — STROKE: EARLY STAGES OF RECOVERY BOOK: Freq: Once | Status: DC

## 2022-04-09 MED ORDER — ALPRAZOLAM 0.25 MG PO TABS
0.5000 mg | ORAL_TABLET | Freq: Two times a day (BID) | ORAL | Status: DC
  Administered 2022-04-10: 0.5 mg via ORAL
  Filled 2022-04-09: qty 2

## 2022-04-09 MED ORDER — ACETAMINOPHEN 500 MG PO TABS: 500.0000 mg | ORAL_TABLET | Freq: Four times a day (QID) | ORAL | Status: DC | PRN

## 2022-04-09 MED ORDER — DOCUSATE SODIUM 100 MG PO CAPS: 100.0000 mg | ORAL_CAPSULE | ORAL | Status: DC

## 2022-04-09 MED ORDER — ACETAMINOPHEN 650 MG RE SUPP: 650.0000 mg | RECTAL | Status: DC | PRN

## 2022-04-09 MED ORDER — PANTOPRAZOLE SODIUM 40 MG PO TBEC: 40.0000 mg | DELAYED_RELEASE_TABLET | Freq: Every day | ORAL | Status: DC

## 2022-04-09 MED ORDER — DULOXETINE HCL 20 MG PO CPEP
20.0000 mg | ORAL_CAPSULE | Freq: Every day | ORAL | Status: DC
  Filled 2022-04-09 (×2): qty 1

## 2022-04-09 MED ORDER — TAMSULOSIN HCL 0.4 MG PO CAPS: 0.4000 mg | ORAL_CAPSULE | Freq: Every day | ORAL | Status: DC

## 2022-04-09 MED ORDER — METOPROLOL SUCCINATE ER 25 MG PO TB24: 100.0000 mg | ORAL_TABLET | Freq: Every day | ORAL | Status: DC

## 2022-04-09 MED ORDER — DICLOFENAC SODIUM 1 % EX GEL
2.0000 g | Freq: Four times a day (QID) | CUTANEOUS | Status: DC | PRN
  Administered 2022-04-12: 2 g via TOPICAL
  Filled 2022-04-09 (×2): qty 100

## 2022-04-09 MED ORDER — NIMODIPINE 30 MG PO CAPS
60.0000 mg | ORAL_CAPSULE | ORAL | Status: DC
  Filled 2022-04-09 (×6): qty 2

## 2022-04-09 MED ORDER — POLYETHYLENE GLYCOL 3350 17 G PO PACK: 17.0000 g | PACK | Freq: Every day | ORAL | Status: DC | PRN

## 2022-04-09 MED ORDER — ACETAMINOPHEN 160 MG/5ML PO SOLN
650.0000 mg | ORAL | Status: DC | PRN
  Administered 2022-04-12: 650 mg
  Filled 2022-04-09: qty 20.3

## 2022-04-09 MED ORDER — LEVETIRACETAM IN NACL 1000 MG/100ML IV SOLN
1000.0000 mg | Freq: Once | INTRAVENOUS | Status: AC
  Administered 2022-04-09: 1000 mg via INTRAVENOUS
  Filled 2022-04-09: qty 100

## 2022-04-09 MED ORDER — CEPHALEXIN 500 MG PO CAPS
500.0000 mg | ORAL_CAPSULE | Freq: Two times a day (BID) | ORAL | Status: DC
  Administered 2022-04-10 – 2022-04-16 (×11): 500 mg via ORAL
  Filled 2022-04-09 (×10): qty 1
  Filled 2022-04-09: qty 2
  Filled 2022-04-09: qty 1

## 2022-04-09 MED ORDER — HYDRALAZINE HCL 20 MG/ML IJ SOLN
5.0000 mg | Freq: Four times a day (QID) | INTRAMUSCULAR | Status: DC | PRN
  Administered 2022-04-10: 5 mg via INTRAVENOUS
  Filled 2022-04-09: qty 1

## 2022-04-09 MED ORDER — SODIUM CHLORIDE 0.9 % IV SOLN: INTRAVENOUS | Status: DC

## 2022-04-09 MED ORDER — OMEPRAZOLE MAGNESIUM 20 MG PO TBEC: 20.0000 mg | DELAYED_RELEASE_TABLET | Freq: Every day | ORAL | Status: DC

## 2022-04-09 MED ORDER — LEVETIRACETAM IN NACL 500 MG/100ML IV SOLN
500.0000 mg | Freq: Two times a day (BID) | INTRAVENOUS | Status: AC
  Administered 2022-04-10 – 2022-04-16 (×14): 500 mg via INTRAVENOUS
  Filled 2022-04-09 (×13): qty 100

## 2022-04-09 MED ORDER — ATORVASTATIN CALCIUM 10 MG PO TABS
20.0000 mg | ORAL_TABLET | Freq: Every day | ORAL | Status: DC
  Administered 2022-04-10: 20 mg via ORAL
  Filled 2022-04-09: qty 2

## 2022-04-09 MED ORDER — FUROSEMIDE 10 MG/ML IJ SOLN
20.0000 mg | Freq: Two times a day (BID) | INTRAMUSCULAR | Status: DC
  Administered 2022-04-09 – 2022-04-10 (×2): 20 mg via INTRAVENOUS
  Filled 2022-04-09 (×2): qty 2

## 2022-04-09 MED ORDER — FUROSEMIDE 20 MG PO TABS: 20.0000 mg | ORAL_TABLET | Freq: Every day | ORAL | Status: DC

## 2022-04-09 MED ORDER — ACETAMINOPHEN 325 MG PO TABS
650.0000 mg | ORAL_TABLET | ORAL | Status: DC | PRN
  Administered 2022-04-13 – 2022-04-15 (×4): 650 mg via ORAL
  Filled 2022-04-09 (×4): qty 2

## 2022-04-09 NOTE — H&P (Signed)
History and Physical    Eugene [name] ZOX:[id number] DOB: 09/19/1935 DOA: 04/09/2022  PCP: [name], MD (Confirm with patient/family/NH records and if not entered, this has to be entered at [facility] point of entry) Patient coming from: Home  I have personally briefly reviewed patient's old medical records in [facility]  Chief Complaint: Patient having trouble to speak  HPI: Eugene [name] is a 86 y.o. male with medical history significant of chronic A-fib on Eliquis, seizure disorder on Keppra, hemochromatosis, HTN, HLD, chronic ambulation dysfunction with frequent falls, AAA s/p repair, recent UTI, came with fall and strokelike symptoms.  Patient unable to answer questions, or history provided by son at bedside.  Son reported that patient might have had first fall last Monday-Wednesday, no reported LOC.  Patient does complain about headache on the back but had after the fall.  Friday, patient started having slurred speech and left-sided facial droop, and symptoms persisted through the weekend.  ED Course: No tachycardia, blood pressure slightly elevated no hypoxia.  CT head 1.8 cm hemorrhagic contusion in the left anterior temporal lobe and overlying subdural and subarachnoid hemorrhage mild mass effect with local brain parenchymal but no midline shift.    Patient was loaded with Keppra.  Review of Systems: As per HPI otherwise 14 point review of systems negative.    Past Medical History:  Diagnosis Date   AAA (abdominal aortic aneurysm) (HCC) 06/29/2007   stent graft   Aortic stenosis 09/05/2017   Mild AS/AR by echo 01/2017.   GERD (gastroesophageal reflux disease)    Hemochromatosis    Hyperlipidemia    Hypertension    Irritable bowel syndrome    Permanent atrial fibrillation (HCC)    Stroke (HCC)    Transient Ischemic Attack    Past Surgical History:  Procedure Laterality Date   ABDOMINAL AORTIC ANEURYSM REPAIR     ENDOVASCULAR STENT INSERTION  07/18/2007    saccular infrarenal aortic aneurysm   EYE SURGERY Bilateral    Cataract     IR FLUORO GUIDE CV LINE LEFT  12/08/2018   IR FLUORO GUIDE CV LINE RIGHT  02/13/2017   IR FLUORO GUIDE CV LINE RIGHT  02/25/2017   IR US GUIDE VASC ACCESS RIGHT  02/13/2017   PICC LINE PLACE PERIPHERAL ([facility] HX)  Oct 21, 2014     reports that he quit smoking about 22 years ago. His smoking use included cigarettes. He has a 40.00 pack-year smoking history. He has never used smokeless tobacco. He reports current alcohol use of about 1.0 - 2.0 standard drink of alcohol per week. He reports that he does not use drugs.  Allergies  Allergen Reactions   Chlorhexidine Gluconate Itching    Don't use Chloraprep inside port a cath kits.   Macrolides And Ketolides Nausea And Vomiting   Tape Other (See Comments)    Adhesive tape,   Only use paper tape    Family History  Problem Relation Age of Onset   Hypertension Mother    Heart disease Mother        AAA, he passed in his 64 s of Heart Diseasse   Heart disease Father        AAA history and he passed at age 7  of   Disease    Prior to Admission medications   Medication Sig Start Date End Date Taking? Authorizing Provider  acetaminophen (TYLENOL) 500 MG tablet Take 500 mg by mouth every 6 (six) hours as needed for moderate pain.  Yes [provider]  ALPRAZolam Prudy Feeler(XANAX) 1 MG tablet Take 0.5 tablets (0.5 mg total) by mouth 2 (two) times daily. For anxiety 01/26/22  Yes Love, Evlyn Kanneramela S, PA-C  apixaban (ELIQUIS) 5 MG TABS tablet Take 1 tablet (5 mg total) by mouth 2 (two) times daily. 01/26/22  Yes Love, Evlyn KannerPamela S, PA-C  Ascorbic Acid (VITAMIN C PO) Take 1 tablet by mouth in the morning.   Yes [provider]  atorvastatin (LIPITOR) 20 MG tablet Take 1 tablet (20 mg total) by mouth daily. 12/19/21 12/20/22 Yes Conte, Tessa N, PA-C  cephALEXin (KEFLEX) 500 MG capsule Take 500 mg by mouth 2 (two) times daily. 04/07/22  Yes [provider]  Cholecalciferol  (VITAMIN D-3 PO) Take 1 capsule by mouth in the morning.   Yes [provider]  diclofenac Sodium (VOLTAREN) 1 % GEL Apply 2 g topically 4 (four) times daily as needed (left shoulder pain). 01/26/22  Yes Love, Evlyn KannerPamela S, PA-C  docusate sodium (COLACE) 100 MG capsule TAKE 1 CAPSULE BY MOUTH TWICE A DAY Patient taking differently: Take 100 mg by mouth every other day. 02/20/22  Yes Fanny DanceShtridelman, Yuri, MD  DULoxetine (CYMBALTA) 20 MG capsule Take 20 mg by mouth daily. 03/22/22  Yes [provider]  furosemide (LASIX) 20 MG tablet Take 20 mg by mouth daily. 02/12/22  Yes [provider]  levETIRAcetam (KEPPRA) 500 MG tablet Take 1 tablet (500 mg total) by mouth 2 (two) times daily. 01/26/22  Yes Love, Evlyn KannerPamela S, PA-C  metoprolol succinate (TOPROL-XL) 100 MG 24 hr tablet Take 1 tablet (100 mg total) by mouth daily. Take with or immediately following a meal. 01/26/22  Yes Love, Evlyn KannerPamela S, PA-C  Multiple Vitamin (MULTIVITAMIN WITH MINERALS) TABS tablet Take 1 tablet by mouth daily. 01/26/22  Yes Love, Evlyn KannerPamela S, PA-C  omeprazole (PRILOSEC OTC) 20 MG tablet Take 1 tablet (20 mg total) by mouth daily. 01/16/22 01/17/23 Yes Rhetta MuraSamtani, Jai-Gurmukh, MD  polyethylene glycol (MIRALAX / GLYCOLAX) 17 g packet Take 17 g by mouth daily as needed for mild constipation. 01/26/22  Yes Love, Evlyn KannerPamela S, PA-C  tamsulosin (FLOMAX) 0.4 MG CAPS capsule Take 1 capsule (0.4 mg total) by mouth at bedtime. 01/26/22  Yes Love, Evlyn KannerPamela S, PA-C  Thiamine HCl (VITAMIN B-1 PO) Take 1 tablet by mouth in the morning.   Yes [provider]  ZINC OXIDE EX Apply 1 application  topically See admin instructions. Apply topically diaper area every time pt uses bathroom as a skin barrier   Yes [provider]  ferrous sulfate 325 (65 FE) MG tablet Take 1 tablet (325 mg total) by mouth daily with breakfast. Patient not taking: Reported on 04/09/2022 01/26/22   Love, Evlyn KannerPamela S, PA-C  QUEtiapine (SEROQUEL) 25 MG tablet Take 0.5  tablets (12.5 mg total) by mouth at bedtime. Patient not taking: Reported on 04/09/2022 01/26/22   Jerene PitchLove, Pamela S, PA-C    Physical Exam: Vitals:   04/09/22 1600 04/09/22 1615 04/09/22 1630 04/09/22 1645  BP: (!) 158/95 (!) 157/90 (!) 137/92 (!) 155/82  Pulse:  88 79 61  Resp: (!) 22 16 17 19   Temp:      TempSrc:      SpO2:  100% 95% 100%    Constitutional: NAD, calm, comfortable Vitals:   04/09/22 1600 04/09/22 1615 04/09/22 1630 04/09/22 1645  BP: (!) 158/95 (!) 157/90 (!) 137/92 (!) 155/82  Pulse:  88 79 61  Resp: (!) 22 16 17 19   Temp:  TempSrc:      SpO2:  100% 95% 100%   Eyes: PERRL, lids and conjunctivae normal ENMT: Mucous membranes are moist. Posterior pharynx clear of any exudate or lesions.Normal dentition.  Neck: normal, supple, no masses, no thyromegaly Respiratory: clear to auscultation bilaterally, no wheezing, diminished breathing sound on left lower fields, fine crackles bilaterally right> left, increasing breathing effort. No accessory muscle use.  Cardiovascular: Regular rate and rhythm, no murmurs / rubs / gallops. No extremity edema. 2+ pedal pulses. No carotid bruits.  Abdomen: no tenderness, no masses palpated. No hepatosplenomegaly. Bowel sounds positive.  Musculoskeletal: no clubbing / cyanosis. No joint deformity upper and lower extremities. Good ROM, no contractures. Normal muscle tone.  Skin: no rashes, lesions, ulcers. No induration Neurologic: Left-sided facial droop and left-sided tongue deviation, garbled speech Psychiatric: Normal judgment and insight. Alert and oriented x 3. Normal mood.     Labs on Admission: I have personally reviewed following labs and imaging studies  CBC: Recent Labs  Lab 04/09/22 1521  WBC 7.8  NEUTROABS 5.5  HGB 12.0*  HCT 36.8*  MCV 94.6  PLT 228   Basic Metabolic Panel: Recent Labs  Lab 04/09/22 1521  NA 133*  K 4.1  CL 95*  CO2 24  GLUCOSE 99  BUN 14  CREATININE 1.13  CALCIUM 9.2  MG 2.0    GFR: CrCl cannot be calculated (Unknown ideal weight.). Liver Function Tests: Recent Labs  Lab 04/09/22 1521  AST 17  ALT 11  ALKPHOS 72  BILITOT 1.5*  PROT 6.3*  ALBUMIN 3.4*   No results for input(s): "LIPASE", "AMYLASE" in the last 168 hours. Recent Labs  Lab 04/09/22 1521  AMMONIA 17   Coagulation Profile: Recent Labs  Lab 04/09/22 1521  INR 1.6*   Cardiac Enzymes: No results for input(s): "CKTOTAL", "CKMB", "CKMBINDEX", "TROPONINI" in the last 168 hours. BNP (last 3 results) No results for input(s): "PROBNP" in the last 8760 hours. HbA1C: No results for input(s): "HGBA1C" in the last 72 hours. CBG: Recent Labs  Lab 04/09/22 1437  GLUCAP 94   Lipid Profile: No results for input(s): "CHOL", "HDL", "LDLCALC", "TRIG", "CHOLHDL", "LDLDIRECT" in the last 72 hours. Thyroid Function Tests: Recent Labs    04/09/22 1521  TSH 2.100   Anemia Panel: No results for input(s): "VITAMINB12", "FOLATE", "FERRITIN", "TIBC", "IRON", "RETICCTPCT" in the last 72 hours. Urine analysis:    Component Value Date/Time   COLORURINE YELLOW 04/09/2022 1538   APPEARANCEUR CLEAR 04/09/2022 1538   LABSPEC 1.009 04/09/2022 1538   PHURINE 7.0 04/09/2022 1538   GLUCOSEU NEGATIVE 04/09/2022 1538   HGBUR NEGATIVE 04/09/2022 1538   BILIRUBINUR NEGATIVE 04/09/2022 1538   KETONESUR NEGATIVE 04/09/2022 1538   PROTEINUR NEGATIVE 04/09/2022 1538   UROBILINOGEN 0.2 07/15/2007 1041   NITRITE NEGATIVE 04/09/2022 1538   LEUKOCYTESUR NEGATIVE 04/09/2022 1538    Radiological Exams on Admission: CT Head Wo Contrast  Result Date: 04/09/2022 CLINICAL DATA:  Trauma EXAM: CT HEAD WITHOUT CONTRAST CT CERVICAL SPINE WITHOUT CONTRAST TECHNIQUE: Multidetector CT imaging of the head and cervical spine was performed following the standard protocol without intravenous contrast. Multiplanar CT image reconstructions of the cervical spine were also generated. RADIATION DOSE REDUCTION: This exam was  performed according to the departmental dose-optimization program which includes automated exposure control, adjustment of the mA and/or kV according to patient size and/or use of iterative reconstruction technique. COMPARISON:  CT head and cervical spine 08/25/2021, MR head 08/26/2021 FINDINGS: CT HEAD FINDINGS Brain: There is a hemorrhagic  contusion in the left temporal lobe measuring 1.8 cm AP x 1.2 cm TV x 1.1 cm cc with mild surrounding edema. There is overlying subarachnoid hemorrhage as well as subdural hematoma centered over the temporal lobe measuring up to 7 mm in maximal thickness. Additional subdural blood is seen layering along the falx posteriorly and left tentorial leaflet measuring up to 4 mm in thickness. Is no definite intraventricular hemorrhage. The subdural blood exerts mild mass effect on the underlying brain parenchyma with no midline shift. There is no evidence of acute territorial infarct. Background parenchymal volume is stable. The ventricles are stable in size. Hypodensity in the supratentorial white matter consistent with chronic small vessel ischemic change is stable. There is no mass lesion.  There is no mass effect or midline shift. Vascular: No hyperdense vessel or unexpected calcification. Skull: Normal. Negative for fracture or focal lesion. Sinuses/Orbits: The paranasal sinuses are clear. Bilateral lens implants are in place. The globes and orbits are otherwise unremarkable. Other: None. CT CERVICAL SPINE FINDINGS Alignment: Normal. There is no jumped or perched facet or other evidence of traumatic malalignment. Skull base and vertebrae: Skull base alignment is maintained. Vertebral body heights are preserved. There is no evidence of acute fracture. There is no suspicious osseous lesion. Soft tissues and spinal canal: No prevertebral fluid or swelling. No visible canal hematoma. Disc levels: There is overall mild multilevel disc space narrowing and degenerative endplate change.  Facet arthropathy is most advanced on the left at C3-C4 and on the right at C5-C6. There is no evidence of high-grade spinal canal stenosis. Upper chest: There is background emphysema in the lung apices. There is an at least moderate-sized left pleural effusion. Other: None. IMPRESSION: 1. 1.8 cm hemorrhagic contusion in the left anterior temporal lobe as well as overlying subdural and subarachnoid hemorrhage. Mild mass effect on the underlying brain parenchyma but no midline shift. 2. No acute fracture or traumatic malalignment of the cervical spine. 3. Moderate-sized left pleural effusion. Critical Value/emergent results were called by telephone at the time of interpretation on 04/09/2022 at 3:18 pm to provider HAYLEY NAASZ , who verbally acknowledged these results. Electronically Signed   By: Lesia Hausen M.D.   On: 04/09/2022 15:39   CT Cervical Spine Wo Contrast  Result Date: 04/09/2022 CLINICAL DATA:  Trauma EXAM: CT HEAD WITHOUT CONTRAST CT CERVICAL SPINE WITHOUT CONTRAST TECHNIQUE: Multidetector CT imaging of the head and cervical spine was performed following the standard protocol without intravenous contrast. Multiplanar CT image reconstructions of the cervical spine were also generated. RADIATION DOSE REDUCTION: This exam was performed according to the departmental dose-optimization program which includes automated exposure control, adjustment of the mA and/or kV according to patient size and/or use of iterative reconstruction technique. COMPARISON:  CT head and cervical spine 08/25/2021, MR head 08/26/2021 FINDINGS: CT HEAD FINDINGS Brain: There is a hemorrhagic contusion in the left temporal lobe measuring 1.8 cm AP x 1.2 cm TV x 1.1 cm cc with mild surrounding edema. There is overlying subarachnoid hemorrhage as well as subdural hematoma centered over the temporal lobe measuring up to 7 mm in maximal thickness. Additional subdural blood is seen layering along the falx posteriorly and left  tentorial leaflet measuring up to 4 mm in thickness. Is no definite intraventricular hemorrhage. The subdural blood exerts mild mass effect on the underlying brain parenchyma with no midline shift. There is no evidence of acute territorial infarct. Background parenchymal volume is stable. The ventricles are stable in size. Hypodensity in the  supratentorial white matter consistent with chronic small vessel ischemic change is stable. There is no mass lesion.  There is no mass effect or midline shift. Vascular: No hyperdense vessel or unexpected calcification. Skull: Normal. Negative for fracture or focal lesion. Sinuses/Orbits: The paranasal sinuses are clear. Bilateral lens implants are in place. The globes and orbits are otherwise unremarkable. Other: None. CT CERVICAL SPINE FINDINGS Alignment: Normal. There is no jumped or perched facet or other evidence of traumatic malalignment. Skull base and vertebrae: Skull base alignment is maintained. Vertebral body heights are preserved. There is no evidence of acute fracture. There is no suspicious osseous lesion. Soft tissues and spinal canal: No prevertebral fluid or swelling. No visible canal hematoma. Disc levels: There is overall mild multilevel disc space narrowing and degenerative endplate change. Facet arthropathy is most advanced on the left at C3-C4 and on the right at C5-C6. There is no evidence of high-grade spinal canal stenosis. Upper chest: There is background emphysema in the lung apices. There is an at least moderate-sized left pleural effusion. Other: None. IMPRESSION: 1. 1.8 cm hemorrhagic contusion in the left anterior temporal lobe as well as overlying subdural and subarachnoid hemorrhage. Mild mass effect on the underlying brain parenchyma but no midline shift. 2. No acute fracture or traumatic malalignment of the cervical spine. 3. Moderate-sized left pleural effusion. Critical Value/emergent results were called by telephone at the time of  interpretation on 04/09/2022 at 3:18 pm to provider HAYLEY NAASZ , who verbally acknowledged these results. Electronically Signed   By: Lesia Hausen M.D.   On: 04/09/2022 15:39   DG Chest Portable 1 View  Result Date: 04/09/2022 CLINICAL DATA:  Fall, altered mental status. EXAM: PORTABLE CHEST 1 VIEW COMPARISON:  Chest radiograph dated February 28, 2022 FINDINGS: The heart is enlarged. Prominent aortic atherosclerotic calcifications. Right lung is clear. Left basilar opacity suggesting atelectasis and/small effusion, unchanged. IMPRESSION: Left basilar opacity suggesting atelectasis and/small effusion, unchanged. Electronically Signed   By: Larose Hires D.O.   On: 04/09/2022 15:32    EKG: Independently reviewed.  A-fib, rate controlled, no acute ST changes.  Assessment/Plan Principal Problem:   SAH (subarachnoid hemorrhage) (HCC) Active Problems:   Subdural hematoma (HCC)  (please populate well all problems here in Problem List. (For example, if patient is on BP meds at home and you resume or decide to hold them, it is a problem that needs to be her. Same for CAD, COPD, HLD and so on)  Acute/subacute SAH and subdural hematoma, traumatic -From fall last week -With strokelike symptoms including dysarthria and left 7th nerve palsy -Indication for stringent blood pressure control target 130/80, continue metoprolol and increase Lasix from p.o. 20 mg daily to IV 20 mg twice daily -Add as needed hydralazine -For SAH, start nimodipine for 3 weeks -Hold off Eliquis for 7 days as per neurosurgery -Prognosis guarded as patient sustained several falls this year including one episode causing pneumothorax earlier this year.  Family made aware.  Confirmed patient is DNR. -PT evaluation -N.p.o. for now, speech evaluation. -Frequent neurochecks, consider repeat CT head if neuro symptoms develop. -Seizure prophylaxis as per neurology  HTN -As above  Chronic A-fib -Rate controlled A-fib, continue  metoprolol -Hold off Eliquis and chemical DVT prophylaxis.  Last Eliquis was this morning > 12 hours ago, no indication for reversal.  History of seizure disorder -Continue Keppra  Left-sided pleural effusion -Acute on chronic, no significant history of CHF, normal LVEF and unknown diastolic function on recent echo.  Empirically  increasing Lasix as above.  Given patient's age and other comorbidities, conservative measures indicated..  DVT prophylaxis: SCD Code Status: DNR Family Communication: Son at bedside Disposition Plan: Patient is sick with significant neurodeficit after fall and SAH/SDH, expect more than 2 midnight hospital stay Consults called: Neurology and neurosurgery Admission status: Telemetry admission   Emeline General MD [facility] [phone]  04/09/2022, 6:19 PM

## 2022-04-09 NOTE — Progress Notes (Signed)
Patient ID: Eugene [name], male   DOB: 07/20/1935, 86 y.o.   MRN: [id number] We were called to look at the CT scan of this 86 year old gentleman who fell last Wednesday.  He is on Eliquis for atrial fibrillation.  Brought to emergency department today because his family noted some expressive aphasia over the weekend.  CT scan shows a left temporal contusion and some small subdural blood layering along the temporal lobe and falx.  He does not need any surgical intervention.  This is a closed head injury with a small subdural hematoma and small temporal contusion on the left likely explains  his expressive issues.  Would hold his anticoagulation for a few days, though you could argue he has been on anticoagulation with this injury for 5 days,  and some   Would resume anticoagulation after 3-5 days of observation had this been diagnosed on the day of injury.  Despite this, I would likely hold anticoagulation for about a week, repeat his head CT in a few days, and I suspect this contusion and subdural blood will resolve over time.  We are  happy to see him and follow along if he is admitted.

## 2022-04-09 NOTE — ED Provider Notes (Signed)
[facility] Provider Note   CSN: [id number] Arrival date & time:        History  No chief complaint on file.   AKIM STEGER is a 86 y.o. male with A-fib on Eliquis, GERD, hemochromatosis, NSVT, history of CVA, recent history of traumatic L hemothorax requiring chest tube, CKD stage III, CHF presents with aphasia, fall.   History is limited with patient aphasia.  History obtained by EMS and RN at bedside.  There is no family available for further history.  Per [facility], patient fell on Friday and started having gradual onset aphasia over the weekend until today.   Patient is expressively aphasic with me and minimally contributory to history. Appears that he understands most of what I ask but cannot respond. Intermittently shaking or nodding yes or no. Able to deny headache, neck pain, CP, abd pain. Appears that he has decreased sensation to his BL UEs but not his LEs. Furhter history limited by patient's aphasia and acuity of situation. Does take eliquis for afib. Unknown last dose of eliquis.   HPI     Home Medications Prior to Admission medications   Medication Sig Start Date End Date Taking? Authorizing Provider  acetaminophen (TYLENOL) 325 MG tablet Take 2 tablets (650 mg total) by mouth 4 (four) times daily -  with meals and at bedtime. 01/19/22   Love, Ivan Anchors, PA-C  ALPRAZolam Duanne Moron) 1 MG tablet Take 0.5 tablets (0.5 mg total) by mouth 2 (two) times daily. For anxiety 01/26/22   Love, Ivan Anchors, PA-C  apixaban (ELIQUIS) 5 MG TABS tablet Take 1 tablet (5 mg total) by mouth 2 (two) times daily. 01/26/22   Love, Ivan Anchors, PA-C  Ascorbic Acid (VITAMIN C PO) Take 1 tablet by mouth in the morning.    [provider]  atorvastatin (LIPITOR) 20 MG tablet Take 1 tablet (20 mg total) by mouth daily. 12/19/21 12/20/22  Elgie Collard, PA-C  Cholecalciferol (VITAMIN D-3 PO) Take 1 capsule by mouth in the morning.    [provider]   diclofenac Sodium (VOLTAREN) 1 % GEL Apply 2 g topically 4 (four) times daily as needed (left shoulder pain). 01/26/22   Love, Ivan Anchors, PA-C  docusate sodium (COLACE) 100 MG capsule TAKE 1 CAPSULE BY MOUTH TWICE A DAY 02/20/22   Jennye Boroughs, MD  ferrous sulfate 325 (65 FE) MG tablet Take 1 tablet (325 mg total) by mouth daily with breakfast. 01/26/22   Love, Ivan Anchors, PA-C  levETIRAcetam (KEPPRA) 500 MG tablet Take 1 tablet (500 mg total) by mouth 2 (two) times daily. 01/26/22   Love, Ivan Anchors, PA-C  metoprolol succinate (TOPROL-XL) 100 MG 24 hr tablet Take 1 tablet (100 mg total) by mouth daily. Take with or immediately following a meal. 01/26/22   Love, Ivan Anchors, PA-C  Multiple Vitamin (MULTIVITAMIN WITH MINERALS) TABS tablet Take 1 tablet by mouth daily. 01/26/22   Love, Ivan Anchors, PA-C  omeprazole (PRILOSEC OTC) 20 MG tablet Take 1 tablet (20 mg total) by mouth daily. 01/16/22 01/17/23  Nita Sells, MD  polyethylene glycol (MIRALAX / GLYCOLAX) 17 g packet Take 17 g by mouth daily as needed for mild constipation. 01/26/22   Love, Ivan Anchors, PA-C  QUEtiapine (SEROQUEL) 25 MG tablet Take 0.5 tablets (12.5 mg total) by mouth at bedtime. 01/26/22   Love, Ivan Anchors, PA-C  tamsulosin (FLOMAX) 0.4 MG CAPS capsule Take 1 capsule (0.4 mg total) by mouth at bedtime. 01/26/22  Love, Pamela S, PA-C  Thiamine HCl (VITAMIN B-1 PO) Take 1 tablet by mouth in the morning.    [provider]  ZINC OXIDE EX Apply 1 application  topically See admin instructions. Apply topically diaper area every time pt uses bathroom as a skin barrier    [provider]      Allergies    Chlorhexidine gluconate, Macrolides and ketolides, and Tape    Review of Systems   Review of Systems  Reason unable to perform ROS: patient aphasic.    Physical Exam Updated Vital Signs BP (!) 150/83 (BP Location: Right Arm)   Pulse 86   Temp 98.2 F (36.8 C) (Oral)   Resp 14   SpO2 99%  Physical Exam General: Normal  appearing elderly male, lying in bed.  HEENT: PERRLA, Sclera anicteric, MMM, trachea midline. Minor abrasion to L temple Cardiology: RRR, no murmurs/rubs/gallops. BL radial and DP pulses equal bilaterally.  Resp: Normal respiratory rate and effort. CTAB, no wheezes, rhonchi, crackles.  Abd: Soft, non-tender, non-distended. No rebound tenderness or guarding.  GU: Deferred. MSK: 1+ pitting edema to BL ankles. No signs of trauma. Extremities without deformity or TTP. No cyanosis or clubbing. Skin: warm, dry. No rashes or lesions. Back: No C-spine stepoffs or tenderness. Neuro: Expressive aphasia. Alert, unable to assess orientation. Responds with nonsense phrases, though intermittently able to answer yes or no questions and does follow commands, does not appear to have receptive aphasia. MAEs. Appears to have decreased sensation in BL UEs, shakes head when asked if pinch is painful or felt, does not withdrawal in BL UEs to pain. Intact motor. No facial droop, EOMI, no nystagmus.  Psych: UTA, appears frustrated   1a  Level of consciousness: 0=alert; keenly responsive  1b. LOC questions:  2=Performs neither task correctly  1c. LOC commands: 0=Performs both tasks correctly  2.  Best Gaze: 0=normal  3.  Visual: 0=No visual loss  4. Facial Palsy: 0=Normal symmetric movement  5a.  Motor left arm: 0=No drift, limb holds 90 (or 45) degrees for full 10 seconds  5b.  Motor right arm: 0=No drift, limb holds 90 (or 45) degrees for full 10 seconds  6a. motor left leg: 0=No drift, limb holds 90 (or 45) degrees for full 10 seconds  6b  Motor right leg:  0=No drift, limb holds 90 (or 45) degrees for full 10 seconds  7. Limb Ataxia: 0=Absent  8.  Sensory: 1=Mild to moderate sensory loss; patient feels pinprick is less sharp or is dull on the affected side; there is a loss of superficial pain with pinprick but patient is aware He is being touched  9. Best Language:  3=Mute, global aphasia; no usable speech or  auditory comprehension  10. Dysarthria: 2=Severe; patient speech is so slurred as to be unintelligible in the absence of or our of proportion to any dysphagia, or is mute/anarthric  11. Extinction and Inattention: 0=No abnormality   Total:   8        ED Results / Procedures / Treatments   Labs (all labs ordered are listed, but only abnormal results are displayed) Labs Reviewed  CBC WITH DIFFERENTIAL/PLATELET  COMPREHENSIVE METABOLIC PANEL  URINALYSIS, ROUTINE W REFLEX MICROSCOPIC  MAGNESIUM  RAPID URINE DRUG SCREEN, HOSP PERFORMED  ETHANOL  AMMONIA  ACETAMINOPHEN LEVEL  SALICYLATE LEVEL  TSH  APTT  PROTIME-INR  CBG MONITORING, ED    EKG EKG Interpretation  Date/Time:  Monday April 09 2022 14:31:48 EST Ventricular Rate:  78  PR Interval:    QRS Duration: 103 QT Interval:  397 QTC Calculation: 453 R Axis:   0 Text Interpretation: Atrial fibrillation Indeterminate axis Abnormal R-wave progression, early transition Confirmed by Vivi Barrack [phone]) on 04/09/2022 3:10:07 PM  Radiology DG Chest Portable 1 View  Result Date: 04/09/2022 CLINICAL DATA:  Fall, altered mental status. EXAM: PORTABLE CHEST 1 VIEW COMPARISON:  Chest radiograph dated February 28, 2022 FINDINGS: The heart is enlarged. Prominent aortic atherosclerotic calcifications. Right lung is clear. Left basilar opacity suggesting atelectasis and/small effusion, unchanged. IMPRESSION: Left basilar opacity suggesting atelectasis and/small effusion, unchanged. Electronically Signed   By: Larose Hires D.O.   On: 04/09/2022 15:32    Procedures Procedures    Medications Ordered in ED Medications - No data to display  ED Course/ Medical Decision Making/ A&P                          Medical Decision Making Amount and/or Complexity of Data Reviewed Labs: ordered. Radiology: ordered.    Patient is HDS, BP 150/83, LKN sometime this weekend, POC glucose 94 mg/dL.   In s/o fall and expressive aphasia, great  concern for traumatic ICH vs ischemic stroke. Visual merchandiser and called CT to have patient moved to CT as soon as possible. Did not stroke code patient given his symptoms have been ongoing for >24 hours. NIHSS 8. Patient does take eilquis, unknown last dose time. Patient wheeled to CT immediately. Also consider other causes of AMS such as hydrocephalus, infection such as UTI or PNA though patient is afebrile and appears to have no other symptoms; toxic overdose of medication or polypharmacy; electrolyte abnormalities or hyper/hypoglycemia, hepatic encephalopathy or uremia, ACS or arrhythmia, endocrine abnormality like myxedema coma. Patient is alert, does not appear to be sleepy like hypercarbic, no hypoxia on SPO2. For further updates please see ED course.    I have personally reviewed and interpreted labs/imaging that have resulted thus far.  Clinical Course as of 04/09/22 1539  Mon Apr 09, 2022  1443 Secretary calling to get patient to CT ASAP. [HN]  1517 Radiologist reporting hemorrhagic L temporal contusion, mild SDH/SAH as well, mild mass effect, no midline shift, C-spine negative.   Calling to neurosurgery. [HN]  1538 Orders placed for elevate HOB, notify provider for >180 mmHg SBP. Patient is signed out to the oncoming ED physician who is made aware of his history, presentation, exam, workup, and plan. Plan is to discuss with neurosurgery, likely neurology as well and admit. Consider reversal of eliquis per NSGY recommendations. Pending labs. [HN]    Clinical Course User Index [HN] Loetta Rough, MD          Final Clinical Impression(s) / ED Diagnoses Final diagnoses:  Expressive aphasia  Traumatic subarachnoid hemorrhage with unknown loss of consciousness status, initial encounter [facility])    Rx / DC Orders ED Discharge Orders     None        This note was created using dictation software, which may contain spelling or grammatical errors.    Loetta Rough,  MD 04/09/22 1539

## 2022-04-09 NOTE — ED Provider Notes (Signed)
Care of patient assumed from Dr. Jearld Fenton.  Medical history includes HLD, hemochromatosis, atrial fibrillation, GERD, HTN, seizure, BPH, anxiety, CVA, CHF, CKD.  He presents today for worsening expressive aphasia over the past 3 days.  He had a reported fall last week.  He is on Eliquis.  He is currently undergoing CT imaging. Physical Exam  BP (!) 150/83 (BP Location: Right Arm)   Pulse 86   Temp 98.2 F (36.8 C) (Oral)   Resp 14   SpO2 99%   Physical Exam Constitutional:      General: He is not in acute distress.    Appearance: Normal appearance. He is not ill-appearing, toxic-appearing or diaphoretic.  HENT:     Head:     Comments: Mild bruising to left temporal area    Right Ear: External ear normal.     Left Ear: External ear normal.     Nose: Nose normal.     Mouth/Throat:     Mouth: Mucous membranes are moist.     Pharynx: Oropharynx is clear.  Eyes:     Extraocular Movements: Extraocular movements intact.  Cardiovascular:     Rate and Rhythm: Normal rate and regular rhythm.  Pulmonary:     Effort: No respiratory distress.     Breath sounds: Normal breath sounds.  Abdominal:     General: Abdomen is flat. There is no distension.     Palpations: Abdomen is soft.     Tenderness: There is no abdominal tenderness.  Musculoskeletal:        General: No swelling, tenderness, deformity or signs of injury. Normal range of motion.     Cervical back: Normal range of motion and neck supple.  Skin:    General: Skin is warm and dry.  Neurological:     Mental Status: He is alert.     GCS: GCS eye subscore is 4. GCS verbal subscore is 5. GCS motor subscore is 6.     Cranial Nerves: Dysarthria present. No facial asymmetry.     Sensory: Sensation is intact. No sensory deficit.     Motor: Motor function is intact. No weakness, abnormal muscle tone or pronator drift.  Psychiatric:        Mood and Affect: Mood normal.        Behavior: Behavior normal.     Procedures  Procedures  ED  Course / MDM   Clinical Course as of 04/09/22 1612  Mon Apr 09, 2022  1443 Secretary calling to get patient to CT ASAP. [HN]  1517 Radiologist reporting hemorrhagic L temporal contusion, mild SDH/SAH as well, mild mass effect, no midline shift, C-spine negative.   Calling to neurosurgery. [HN]  1538 Orders placed for elevate HOB, notify provider for >180 mmHg SBP. Patient is signed out to the oncoming ED physician who is made aware of his history, presentation, exam, workup, and plan. Plan is to discuss with neurosurgery, likely neurology as well and admit. Consider reversal of eliquis per NSGY recommendations. Pending labs. [HN]  1610 Sodium(!): 133 [AK]    Clinical Course User Index [AK] Jonette Pesa, Student-PA [HN] Loetta Rough, MD   Medical Decision Making Amount and/or Complexity of Data Reviewed Labs: ordered. Radiology: ordered.  Risk Prescription drug management.   Patient underwent CT imaging which showed 1.8 cm hemorrhagic contusion with overlying subdural and subarachnoid hemorrhage with mild mass effect but no midline shift.  On assessment, patient resting comfortably.  He is able to answer yes or no questions.  He has garbled speech for any other questions.  He is able to follow commands.  He does not appear to have any motor or sensory deficits.  He denies current headache.  He does have his son at bedside who provides history.  Patient is conversant at baseline.  He lives at home with frequent home nursing care.  Family has been with him over the past several days.  He had a fall on Wednesday.  Early Saturday, changes in speech were noted.  He was started on antibiotic yesterday for UTI.  He has had 1 seizure in the past that was attributed to a UTI.  He is not on any AEDs.  His last dose of blood thinner would have been this morning.  I spoke with neurosurgeon on-call, Dr. Adelene Idler, who does not advise any neurosurgical interventions at this time.  They do feel like  contusion is in the vicinity of language center and symptoms do coincide with his current injury.  They recommend holding anticoagulation.  Neurology recommends seizure prophylaxis and EEG.  Patient was admitted to hospitalist for further management.       Gloris Manchester, MD 04/09/22 [phone]

## 2022-04-09 NOTE — Progress Notes (Signed)
EEG complete - results pending 

## 2022-04-09 NOTE — ED Triage Notes (Signed)
PT BIB [facility] for stroke symptoms.  PT is on Eliquis and had a fall last Monday.  On Friday he began having mild expressive aphasia which worsened overnight and into the weekend. Today he couldn't really get any words out per family.  EMS states aphasia was only neurological deficit they noticed.   Pt has bruise on head from fall.  No other obvious injuries. Pt has hx of previous stroke with no deficits.   EMS vitals 160/90 100%, 84 CBG 90

## 2022-04-10 DIAGNOSIS — R569 Unspecified convulsions: Secondary | ICD-10-CM | POA: Diagnosis not present

## 2022-04-10 DIAGNOSIS — I482 Chronic atrial fibrillation, unspecified: Secondary | ICD-10-CM

## 2022-04-10 DIAGNOSIS — I609 Nontraumatic subarachnoid hemorrhage, unspecified: Secondary | ICD-10-CM | POA: Diagnosis not present

## 2022-04-10 DIAGNOSIS — S065XAA Traumatic subdural hemorrhage with loss of consciousness status unknown, initial encounter: Secondary | ICD-10-CM

## 2022-04-10 DIAGNOSIS — I10 Essential (primary) hypertension: Secondary | ICD-10-CM

## 2022-04-10 LAB — LIPID PANEL
Cholesterol: 118 mg/dL (ref 0–200)
HDL: 45 mg/dL (ref >40)
LDL Cholesterol: 64 mg/dL (ref 0–99)
Total CHOL/HDL Ratio: 2.6 RATIO
Triglycerides: 45 mg/dL (ref <150)
VLDL: 9 mg/dL (ref 0–40)

## 2022-04-10 MED ORDER — FAMOTIDINE 20 MG PO TABS
20.0000 mg | ORAL_TABLET | Freq: Two times a day (BID) | ORAL | Status: DC
  Administered 2022-04-10: 20 mg via ORAL
  Filled 2022-04-10: qty 1

## 2022-04-10 MED ORDER — FAMOTIDINE 20 MG PO TABS
20.0000 mg | ORAL_TABLET | Freq: Two times a day (BID) | ORAL | Status: DC
  Administered 2022-04-11 – 2022-04-16 (×10): 20 mg via ORAL
  Filled 2022-04-10 (×11): qty 1

## 2022-04-10 MED ORDER — NIMODIPINE 6 MG/ML PO SOLN
60.0000 mg | ORAL | Status: DC
  Administered 2022-04-11 – 2022-04-16 (×31): 60 mg via ORAL
  Filled 2022-04-10 (×37): qty 10

## 2022-04-10 MED ORDER — NIMODIPINE 6 MG/ML PO SOLN
60.0000 mg | ORAL | Status: DC
  Administered 2022-04-10: 60 mg via ORAL
  Filled 2022-04-10 (×3): qty 10

## 2022-04-10 MED ORDER — DOCUSATE SODIUM 50 MG/5ML PO LIQD
100.0000 mg | ORAL | Status: DC
  Administered 2022-04-10: 100 mg via ORAL
  Filled 2022-04-10: qty 10

## 2022-04-10 MED ORDER — TAMSULOSIN HCL 0.4 MG PO CAPS
0.4000 mg | ORAL_CAPSULE | Freq: Every day | ORAL | Status: DC
  Administered 2022-04-11 – 2022-04-15 (×5): 0.4 mg via ORAL
  Filled 2022-04-10 (×5): qty 1

## 2022-04-10 MED ORDER — SCOPOLAMINE 1 MG/3DAYS TD PT72
1.0000 | MEDICATED_PATCH | TRANSDERMAL | Status: DC
  Administered 2022-04-10 – 2022-04-16 (×3): 1.5 mg via TRANSDERMAL
  Filled 2022-04-10 (×3): qty 1

## 2022-04-10 MED ORDER — METOPROLOL TARTRATE 25 MG PO TABS
50.0000 mg | ORAL_TABLET | Freq: Two times a day (BID) | ORAL | Status: DC
  Administered 2022-04-10: 50 mg via ORAL
  Filled 2022-04-10: qty 2

## 2022-04-10 MED ORDER — POLYETHYLENE GLYCOL 3350 17 G PO PACK: 17.0000 g | PACK | Freq: Every day | ORAL | Status: DC | PRN

## 2022-04-10 MED ORDER — METOPROLOL TARTRATE 50 MG PO TABS
50.0000 mg | ORAL_TABLET | Freq: Two times a day (BID) | ORAL | Status: DC
  Administered 2022-04-11 – 2022-04-16 (×10): 50 mg via ORAL
  Filled 2022-04-10 (×11): qty 1

## 2022-04-10 MED ORDER — SODIUM CHLORIDE 0.45 % IV SOLN: INTRAVENOUS | Status: DC

## 2022-04-10 MED ORDER — METOPROLOL TARTRATE 5 MG/5ML IV SOLN: 2.5000 mg | Freq: Four times a day (QID) | INTRAVENOUS | Status: DC | PRN

## 2022-04-10 MED ORDER — SODIUM CHLORIDE 0.9 % IV BOLUS
500.0000 mL | Freq: Once | INTRAVENOUS | Status: AC
  Administered 2022-04-10: 500 mL via INTRAVENOUS

## 2022-04-10 MED ORDER — DOCUSATE SODIUM 50 MG/5ML PO LIQD
100.0000 mg | ORAL | Status: DC
  Administered 2022-04-13 – 2022-04-15 (×2): 100 mg via ORAL
  Filled 2022-04-10 (×4): qty 10

## 2022-04-10 NOTE — Progress Notes (Signed)
[facility] PROGRESS NOTE   Eugene [name] HDQ:[id number] DOB: 1935-12-01 DOA: 04/09/2022  PCP: [name], MD  Brief History/Interval Summary: 86 y.o. male with medical history significant of chronic A-fib on Eliquis, seizure disorder on Keppra, hemochromatosis, HTN, HLD, chronic ambulation dysfunction with frequent falls, AAA s/p repair, recent UTI, came with fall and strokelike symptoms.  Patient was found to have subarachnoid hemorrhage and subdural hematoma.  He was hospitalized for further management.  Consultants: Neurosurgery  Procedures: EEG    Subjective/Interval History: Patient with expressive aphasia.  Unable to answer questions.  Does not appear to be in any discomfort.    Assessment/Plan:  Acute subarachnoid hemorrhage/subdural hematoma, traumatic Secondary to fall that he sustained a week prior to admission.  Patient presented with strokelike symptoms including expressive aphasia and left 7th nerve palsy. Patient was on Eliquis for atrial fibrillation.  Currently on hold.  Does not appear that any reversal agents were used. Patient started on nimodipine for subarachnoid hemorrhage. Neurosurgery has seen the patient and they will continue to follow.  Plan is to repeat a CT scan of the head in the next few days. Patient with known history of seizures.  Continue Keppra. PT and OT evaluation. Blood pressure control.  Chronic atrial fibrillation Patient has not passed a swallow screen.  Waiting on speech therapy evaluation.  Use metoprolol intravenously as needed for tachycardia. Eliquis on hold as mentioned above.  Essential hypertension BP is reasonably well controlled.  Target blood pressure is 130/80.  Oropharyngeal dysphagia Await speech therapy evaluation. Remains NPO.  History of seizure disorder Continue Keppra intravenously for now.  Left-sided pleural effusion Appears to be asymptomatic.  Acute on chronic.  Continue to monitor for  now.  Obesity Estimated body mass index is 31.32 kg/m as calculated from the following:   Height as of 02/28/22: 5\' 5"  (1.651 m).   Weight as of 02/28/22: 85.4 kg.   DVT Prophylaxis: SCDs Code Status: DNR Family Communication: No family at bedside Disposition Plan: To be determined  Status is: Inpatient Remains inpatient appropriate because: Subarachnoid hemorrhage/subdural hematoma      Medications: Scheduled:   stroke: early stages of recovery book   Does not apply Once   ALPRAZolam  0.5 mg Oral BID   atorvastatin  20 mg Oral Daily   cephALEXin  500 mg Oral BID   docusate sodium  100 mg Oral QODAY   DULoxetine  20 mg Oral Daily   furosemide  20 mg Intravenous Q12H   metoprolol succinate  100 mg Oral Daily   niMODipine  60 mg Oral Q4H   pantoprazole  40 mg Oral Daily   tamsulosin  0.4 mg Oral QHS   Continuous:  levETIRAcetam Stopped (04/10/22 0104)   04/12/22 **OR** acetaminophen (TYLENOL) oral liquid 160 mg/5 mL **OR** acetaminophen, diclofenac Sodium, hydrALAZINE, metoprolol tartrate, polyethylene glycol  Antibiotics: Anti-infectives (From admission, onward)    Start     Dose/Rate Route Frequency Ordered Stop   04/09/22 2200  cephALEXin (KEFLEX) capsule 500 mg        500 mg Oral 2 times daily 04/09/22 1742         Objective:  Vital Signs  Vitals:   04/10/22 0300 04/10/22 0400 04/10/22 0500 04/10/22 0651  BP: 121/76 134/72 (!) 142/82 (!) 143/79  Pulse: 93 94 95 92  Resp: 16 12 12 18   Temp:    98.7 F (37.1 C)  TempSrc:    Oral  SpO2: 94% 94% 94% 97%  Intake/Output Summary (Last 24 hours) at 05/01/22 0908 Last data filed at 05-01-2022 0658 Gross per 24 hour  Intake 190.24 ml  Output 1200 ml  Net -1009.76 ml   There were no vitals filed for this visit.  General appearance: Awake alert.  In no distress Resp: Clear to auscultation bilaterally.  Normal effort Cardio: S1-S2 is normal regular.  No S3-S4.  No rubs murmurs or bruit GI:  Abdomen is soft.  Nontender nondistended.  Bowel sounds are present normal.  No masses organomegaly Extremities: No edema.  Subtle right-sided weakness noted.   Lab Results:  Data Reviewed: I have personally reviewed following labs and reports of the imaging studies  CBC: Recent Labs  Lab 04/09/22 1521  WBC 7.8  NEUTROABS 5.5  HGB 12.0*  HCT 36.8*  MCV 94.6  PLT 228    Basic Metabolic Panel: Recent Labs  Lab 04/09/22 1521  NA 133*  K 4.1  CL 95*  CO2 24  GLUCOSE 99  BUN 14  CREATININE 1.13  CALCIUM 9.2  MG 2.0    GFR: CrCl cannot be calculated (Unknown ideal weight.).  Liver Function Tests: Recent Labs  Lab 04/09/22 1521  AST 17  ALT 11  ALKPHOS 72  BILITOT 1.5*  PROT 6.3*  ALBUMIN 3.4*     Recent Labs  Lab 04/09/22 1521  AMMONIA 17    Coagulation Profile: Recent Labs  Lab 04/09/22 1521  INR 1.6*     CBG: Recent Labs  Lab 04/09/22 1437  GLUCAP 94    Lipid Profile: Recent Labs    01-May-2022 0652  CHOL 118  HDL 45  LDLCALC 64  TRIG 45  CHOLHDL 2.6    Thyroid Function Tests: Recent Labs    04/09/22 1521  TSH 2.100     Radiology Studies: EEG adult  Result Date: 05/01/22 Charlsie Quest, MD     05/01/2022  8:50 AM Patient Name: Eugene [name] MRN: [id number] Epilepsy Attending: Charlsie Quest Referring Physician/Provider: Gloris Manchester, MD Date:04/09/2022 Duration: 22.23 mins Patient history: 86 year old male with seizures, now with subdural hematoma and subarachnoid hemorrhage.  EEG to evaluate for seizure. Level of alertness: Awake AEDs during EEG study: Xanax, LEV Technical aspects: This EEG study was done with scalp electrodes positioned according to the 10-20 International system of electrode placement. Electrical activity was reviewed with band pass filter of 1-70Hz , sensitivity of 7 uV/mm, display speed of 6mm/sec with a 60Hz  notched filter applied as appropriate. EEG data were recorded continuously and digitally  stored.  Video monitoring was available and reviewed as appropriate. Description: The posterior dominant rhythm consists of 8 Hz activity of moderate voltage (25-35 uV) seen predominantly in posterior head regions, symmetric and reactive to eye opening and eye closing.  2 to 3 Hz rhythmic, sharply contoured delta slowing was seen in left hemisphere, maximal left temporal region. Intermittent generalized 3 to 5-second episode was also noted.  Hyperventilation and photic stimulation were not performed.   ABNORMALITY - Lateralized rhythmic delta activity, left hemisphere - Intermittent slow, generalized IMPRESSION: This study is suggestive of cortical dysfunction in left hemisphere most likely secondary to underlying structural abnormality.  Additionally there is mild diffuse encephalopathy, nonspecific etiology.  No seizures or definite epileptiform discharges were seen throughout the recording.   CT Head Wo Contrast  Result Date: 04/09/2022 CLINICAL DATA:  Trauma EXAM: CT HEAD WITHOUT CONTRAST CT CERVICAL SPINE WITHOUT CONTRAST TECHNIQUE: Multidetector CT imaging of the head and cervical spine was performed  following the standard protocol without intravenous contrast. Multiplanar CT image reconstructions of the cervical spine were also generated. RADIATION DOSE REDUCTION: This exam was performed according to the departmental dose-optimization program which includes automated exposure control, adjustment of the mA and/or kV according to patient size and/or use of iterative reconstruction technique. COMPARISON:  CT head and cervical spine 08/25/2021, MR head 08/26/2021 FINDINGS: CT HEAD FINDINGS Brain: There is a hemorrhagic contusion in the left temporal lobe measuring 1.8 cm AP x 1.2 cm TV x 1.1 cm cc with mild surrounding edema. There is overlying subarachnoid hemorrhage as well as subdural hematoma centered over the temporal lobe measuring up to 7 mm in maximal thickness. Additional subdural  blood is seen layering along the falx posteriorly and left tentorial leaflet measuring up to 4 mm in thickness. Is no definite intraventricular hemorrhage. The subdural blood exerts mild mass effect on the underlying brain parenchyma with no midline shift. There is no evidence of acute territorial infarct. Background parenchymal volume is stable. The ventricles are stable in size. Hypodensity in the supratentorial white matter consistent with chronic small vessel ischemic change is stable. There is no mass lesion.  There is no mass effect or midline shift. Vascular: No hyperdense vessel or unexpected calcification. Skull: Normal. Negative for fracture or focal lesion. Sinuses/Orbits: The paranasal sinuses are clear. Bilateral lens implants are in place. The globes and orbits are otherwise unremarkable. Other: None. CT CERVICAL SPINE FINDINGS Alignment: Normal. There is no jumped or perched facet or other evidence of traumatic malalignment. Skull base and vertebrae: Skull base alignment is maintained. Vertebral body heights are preserved. There is no evidence of acute fracture. There is no suspicious osseous lesion. Soft tissues and spinal canal: No prevertebral fluid or swelling. No visible canal hematoma. Disc levels: There is overall mild multilevel disc space narrowing and degenerative endplate change. Facet arthropathy is most advanced on the left at C3-C4 and on the right at C5-C6. There is no evidence of high-grade spinal canal stenosis. Upper chest: There is background emphysema in the lung apices. There is an at least moderate-sized left pleural effusion. Other: None. IMPRESSION: 1. 1.8 cm hemorrhagic contusion in the left anterior temporal lobe as well as overlying subdural and subarachnoid hemorrhage. Mild mass effect on the underlying brain parenchyma but no midline shift. 2. No acute fracture or traumatic malalignment of the cervical spine. 3. Moderate-sized left pleural effusion. Critical Value/emergent  results were called by telephone at the time of interpretation on 04/09/2022 at 3:18 pm to provider HAYLEY NAASZ , who verbally acknowledged these results. Electronically Signed   By: Lesia Hausen M.D.   On: 04/09/2022 15:39   CT Cervical Spine Wo Contrast  Result Date: 04/09/2022 CLINICAL DATA:  Trauma EXAM: CT HEAD WITHOUT CONTRAST CT CERVICAL SPINE WITHOUT CONTRAST TECHNIQUE: Multidetector CT imaging of the head and cervical spine was performed following the standard protocol without intravenous contrast. Multiplanar CT image reconstructions of the cervical spine were also generated. RADIATION DOSE REDUCTION: This exam was performed according to the departmental dose-optimization program which includes automated exposure control, adjustment of the mA and/or kV according to patient size and/or use of iterative reconstruction technique. COMPARISON:  CT head and cervical spine 08/25/2021, MR head 08/26/2021 FINDINGS: CT HEAD FINDINGS Brain: There is a hemorrhagic contusion in the left temporal lobe measuring 1.8 cm AP x 1.2 cm TV x 1.1 cm cc with mild surrounding edema. There is overlying subarachnoid hemorrhage as well as subdural hematoma centered over the temporal lobe  measuring up to 7 mm in maximal thickness. Additional subdural blood is seen layering along the falx posteriorly and left tentorial leaflet measuring up to 4 mm in thickness. Is no definite intraventricular hemorrhage. The subdural blood exerts mild mass effect on the underlying brain parenchyma with no midline shift. There is no evidence of acute territorial infarct. Background parenchymal volume is stable. The ventricles are stable in size. Hypodensity in the supratentorial white matter consistent with chronic small vessel ischemic change is stable. There is no mass lesion.  There is no mass effect or midline shift. Vascular: No hyperdense vessel or unexpected calcification. Skull: Normal. Negative for fracture or focal lesion.  Sinuses/Orbits: The paranasal sinuses are clear. Bilateral lens implants are in place. The globes and orbits are otherwise unremarkable. Other: None. CT CERVICAL SPINE FINDINGS Alignment: Normal. There is no jumped or perched facet or other evidence of traumatic malalignment. Skull base and vertebrae: Skull base alignment is maintained. Vertebral body heights are preserved. There is no evidence of acute fracture. There is no suspicious osseous lesion. Soft tissues and spinal canal: No prevertebral fluid or swelling. No visible canal hematoma. Disc levels: There is overall mild multilevel disc space narrowing and degenerative endplate change. Facet arthropathy is most advanced on the left at C3-C4 and on the right at C5-C6. There is no evidence of high-grade spinal canal stenosis. Upper chest: There is background emphysema in the lung apices. There is an at least moderate-sized left pleural effusion. Other: None. IMPRESSION: 1. 1.8 cm hemorrhagic contusion in the left anterior temporal lobe as well as overlying subdural and subarachnoid hemorrhage. Mild mass effect on the underlying brain parenchyma but no midline shift. 2. No acute fracture or traumatic malalignment of the cervical spine. 3. Moderate-sized left pleural effusion. Critical Value/emergent results were called by telephone at the time of interpretation on 04/09/2022 at 3:18 pm to provider HAYLEY NAASZ , who verbally acknowledged these results. Electronically Signed   By: Lesia HausenPeter  Noone M.D.   On: 04/09/2022 15:39   DG Chest Portable 1 View  Result Date: 04/09/2022 CLINICAL DATA:  Fall, altered mental status. EXAM: PORTABLE CHEST 1 VIEW COMPARISON:  Chest radiograph dated February 28, 2022 FINDINGS: The heart is enlarged. Prominent aortic atherosclerotic calcifications. Right lung is clear. Left basilar opacity suggesting atelectasis and/small effusion, unchanged. IMPRESSION: Left basilar opacity suggesting atelectasis and/small effusion, unchanged.  Electronically Signed   By: Larose HiresImran  Ahmed D.O.   On: 04/09/2022 15:32       LOS: 1 day   Osvaldo ShipperGokul Shemeka Wardle  [facility] Pager on [email or web address]  04/10/2022, 9:08 AM

## 2022-04-10 NOTE — ED Notes (Signed)
Patient continues to have expressive aphasia.  Able to follow simple commands.  Can answer yes or no questions.  When asked to state name, speech is slurred and patient is unable to say name or birthday.  When asked if his name and birth date are correct, patient can state "yes."  Updated patient on plan of care

## 2022-04-10 NOTE — Progress Notes (Signed)
PT Cancellation Note  Patient Details Name: Eugene [name] MRN: [id number] DOB: 09/10/1935   Cancelled Treatment:    Reason Eval/Treat Not Completed: Medical issues which prohibited therapy.  Nsg requested PT wait due to pt having coughed and now is hypoxic and requiring medical intervention for swallow issues.  Follow up at another time.   Ivar Drape 04/10/2022, 1:24 PM  Samul Dada, PT PhD Acute Rehab Dept. Number: [facility] [id number] and [facility] [phone]

## 2022-04-10 NOTE — ED Notes (Signed)
Pt is having a lot of secretions, messaged MD for medication.

## 2022-04-10 NOTE — ED Notes (Signed)
Patient resting quietly.  Respirations even and unlabored.  Eyes currently closed.  Will continue to monitor

## 2022-04-10 NOTE — ED Notes (Addendum)
Attempted to give pt other medications that was due crushed in pudding.  Pt had a bad coughing fit that required suctioning.  Pt became tachycardic in the 140s and O2 sats dropped into the 80s.  3L O2 applied via [place].  RN remains at bedside with pt.  Messaged Dr. Osvaldo Shipper regarding episode.  He states to place him back to strict NPO status.

## 2022-04-10 NOTE — Evaluation (Signed)
Clinical/Bedside Swallow Evaluation Patient Details  Name: Eugene [name] MRN: [id number] Date of Birth: 1936/04/14  Today's Date: 04/10/2022 Time: SLP Start Time (ACUTE ONLY): 0737 SLP Stop Time (ACUTE ONLY): 0940 SLP Time Calculation (min) (ACUTE ONLY): 15 min  Past Medical History:  Past Medical History:  Diagnosis Date   AAA (abdominal aortic aneurysm) (HCC) 06/29/2007   stent graft   Aortic stenosis 09/05/2017   Mild AS/AR by echo 01/2017.   GERD (gastroesophageal reflux disease)    Hemochromatosis    Hyperlipidemia    Hypertension    Irritable bowel syndrome    Permanent atrial fibrillation (HCC)    Stroke (HCC)    Transient Ischemic Attack   Past Surgical History:  Past Surgical History:  Procedure Laterality Date   ABDOMINAL AORTIC ANEURYSM REPAIR     ENDOVASCULAR STENT INSERTION  07/18/2007   saccular infrarenal aortic aneurysm   EYE SURGERY Bilateral    Cataract     IR FLUORO GUIDE CV LINE LEFT  12/08/2018   IR FLUORO GUIDE CV LINE RIGHT  02/13/2017   IR FLUORO GUIDE CV LINE RIGHT  02/25/2017   IR US GUIDE VASC ACCESS RIGHT  02/13/2017   PICC LINE PLACE PERIPHERAL ([facility] HX)  Oct 21, 2014   HPI:  Patient is an 86 y.o. male with PMH: chronic a-fib, seizure disorder, HTN, HLD, chronic ambulation dysfunction with frequent falls, AAA s/p repair, recent UTI, CVA. He was admitted in August of 2023 following fall onto side of bathtub and ultimately was evaluated and treated by SLP for dysphagia (appeared to be acute on chronic). CT Head showed 1.8 cm hemorrhagic contusion of left temporal lobe as well as overlying SDH and SAH, mild mass effect on the underlying brain parenchyma but no midline shift. He failed Yale swallow with RN and was made NPO awaiting SLP evaluation of swallow function.    Assessment / Plan / Recommendation  Clinical Impression  Patient presents with clinical s/s of dysphagia as per this bedside swallow evaluation. Of note, SLP reviewed notes from when  patient was seen by [facility] SLP for dysphagia. During that time, patient's dysphagia was expected to be acute on chronic and after MBS on 01/22/22, recommendation was Dys 2 solid, thin liquids, no straws and extra swallows to clear pharyngeal residuals. He was ultimately upgraded to Dys 3 (mechanical soft) solids prior to discharge home with family. During today's swallow evaluation, SLP observed patient with cup sips of thin liquids (water). SLP suspected swallow initiation delay. Patient with intermittent, congested sounding but non-productive coughing which is consistent with patient's prior diagnoses of GERD as well as retention of barium residuals in pharynx seen during MBS.  SLP is recommending initiate diet of Dys 2 (minced) solids and thin liquids, meds crushed in puree and will follow patient for diet toleration and ability to upgrade solid consistencies. SLP Visit Diagnosis: Dysphagia, unspecified (R13.10)    Aspiration Risk  Mild aspiration risk    Diet Recommendation Thin liquid;Dysphagia 2 (Fine chop)   Liquid Administration via: Cup Medication Administration: Crushed with puree Supervision: Patient able to self feed;Intermittent supervision to cue for compensatory strategies Compensations: Minimize environmental distractions;Slow rate;Small sips/bites Postural Changes: Seated upright at 90 degrees    Other  Recommendations Oral Care Recommendations: Oral care BID    Recommendations for follow up therapy are one component of a multi-disciplinary discharge planning process, led by the attending physician.  Recommendations may be updated based on patient status, additional functional criteria and insurance authorization.  Follow up Recommendations Acute inpatient rehab (3hours/day)      Assistance Recommended at Discharge Frequent or constant Supervision/Assistance  Functional Status Assessment Patient has had a recent decline in their functional status and demonstrates the ability to  make significant improvements in function in a reasonable and predictable amount of time.  Frequency and Duration min 2x/week  2 weeks       Prognosis Prognosis for Safe Diet Advancement: Good Barriers to Reach Goals: Cognitive deficits;Time post onset;Motivation      Swallow Study   General Date of Onset: 04/10/22 HPI: Patient is an 86 y.o. male with PMH: chronic a-fib, seizure disorder, HTN, HLD, chronic ambulation dysfunction with frequent falls, AAA s/p repair, recent UTI, CVA. He was admitted in August of 2023 following fall onto side of bathtub and ultimately was evaluated and treated by SLP for dysphagia (appeared to be acute on chronic). CT Head showed 1.8 cm hemorrhagic contusion of left temporal lobe as well as overlying SDH and SAH, mild mass effect on the underlying brain parenchyma but no midline shift. He failed Yale swallow with RN and was made NPO awaiting SLP evaluation of swallow function. Type of Study: Bedside Swallow Evaluation Previous Swallow Assessment: during admission in August 2023 while in [facility] Diet Prior to this Study: NPO Temperature Spikes Noted: No Respiratory Status: Room air History of Recent Intubation: No Behavior/Cognition: Alert;Confused;Cooperative;Requires cueing Oral Cavity Assessment: Within Functional Limits Oral Care Completed by SLP: No Oral Cavity - Dentition: Adequate natural dentition Vision: Functional for self-feeding Self-Feeding Abilities: Able to feed self Patient Positioning: Upright in bed Baseline Vocal Quality: Normal Volitional Cough: Congested Volitional Swallow: Able to elicit    Oral/Motor/Sensory Function Overall Oral Motor/Sensory Function: Other (comment) (unable to follow directions for lingual ROM, but no obvious facial or bilabial musculature impairments)   Ice Chips     Thin Liquid Thin Liquid: Impaired Presentation: Cup Pharyngeal  Phase Impairments: Cough - Delayed;Throat Clearing - Delayed Other Comments:  patient with congested vocal quality and frequent throat clearing/coughing with audible secretions but patient not clearing    Nectar Thick     Honey Thick     Puree Puree: Not tested Other Comments: patient declined   Solid     Solid: Not tested Other Comments: patient declined     Angela Nevin, MA, CCC-SLP Speech Therapy

## 2022-04-10 NOTE — Procedures (Signed)
Patient Name: Eugene [name]  MRN: [id number]  Epilepsy Attending: Charlsie Quest  Referring Physician/Provider: Gloris Manchester, MD  Date:04/09/2022 Duration: 22.23 mins  Patient history: 86 year old male with seizures, now with subdural hematoma and subarachnoid hemorrhage.  EEG to evaluate for seizure.  Level of alertness: Awake  AEDs during EEG study: Xanax, LEV  Technical aspects: This EEG study was done with scalp electrodes positioned according to the 10-20 International system of electrode placement. Electrical activity was reviewed with band pass filter of 1-70Hz , sensitivity of 7 uV/mm, display speed of 75mm/sec with a 60Hz  notched filter applied as appropriate. EEG data were recorded continuously and digitally stored.  Video monitoring was available and reviewed as appropriate.  Description: The posterior dominant rhythm consists of 8 Hz activity of moderate voltage (25-35 uV) seen predominantly in posterior head regions, symmetric and reactive to eye opening and eye closing.  2 to 3 Hz rhythmic, sharply contoured delta slowing was seen in left hemisphere, maximal left temporal region. Intermittent generalized 3 to 5-second episode was also noted.  Hyperventilation and photic stimulation were not performed.     ABNORMALITY - Lateralized rhythmic delta activity, left hemisphere - Intermittent slow, generalized  IMPRESSION: This study is suggestive of cortical dysfunction in left hemisphere most likely secondary to underlying structural abnormality.  Additionally there is mild diffuse encephalopathy, nonspecific etiology.  No seizures or definite epileptiform discharges were seen throughout the recording.  Nomi Rudnicki 

## 2022-04-10 NOTE — ED Notes (Signed)
Pt transferred to hospital bed

## 2022-04-10 NOTE — ED Notes (Signed)
Pt's daughter at bedside.  Gave update.  Pt O2 sats noted to be 85% increased to 4L via [place].

## 2022-04-10 NOTE — Evaluation (Signed)
Speech Language Pathology Evaluation Patient Details Name: Eugene [name] MRN: [id number] DOB: 1935-08-08 Today's Date: 04/10/2022 Time: 4098-1191 SLP Time Calculation (min) (ACUTE ONLY): 15 min  Problem List:  Patient Active Problem List   Diagnosis Date Noted   SAH (subarachnoid hemorrhage) (HCC) 04/09/2022   Subdural hematoma (HCC) 04/09/2022   Pleural effusion 02/28/2022   CHF (congestive heart failure) (HCC) 02/28/2022   Anemia 02/28/2022   Stage 3a chronic kidney disease (HCC)    Fever, unspecified    Neck pain    Depressive reaction    Debility 01/17/2022   Malnutrition of moderate degree 01/12/2022   Large pleural effusion 01/11/2022   Acute kidney injury superimposed on chronic kidney disease (HCC) 01/11/2022   Dyslipidemia 01/11/2022   Mood disorder (HCC) 01/11/2022   DNR (do not resuscitate) 01/11/2022   Hemothorax on left 01/11/2022   Shoulder pain 08/29/2021   UTI (urinary tract infection) 08/26/2021   V-tach (HCC)    Seizure (HCC) 08/25/2021   Cardiomegaly NSVT 08/25/2021   BPH (benign prostatic hyperplasia) 08/25/2021   NSVT (nonsustained ventricular tachycardia) (HCC) 08/25/2021   Renal insufficiency 08/25/2021   Anxiety 08/25/2021   Chronic anticoagulation 08/25/2021   Acute encephalopathy 08/25/2021   History of CVA (cerebrovascular accident) 08/25/2021   Hypoxia 08/25/2021   Hyponatremia 03/24/2021   Greater trochanter fracture (HCC) 03/24/2021   Hypokalemia 03/24/2021   Hypomagnesemia 03/24/2021   Hypochloremia 03/24/2021   Aortic stenosis 09/05/2017   PICC (peripherally inserted central catheter) flush 02/19/2017   Benign essential HTN 02/10/2014   Atrial fibrillation (HCC)    GERD (gastroesophageal reflux disease)    Aftercare following surgery of the circulatory system, NEC 10/13/2012   Occlusion and stenosis of carotid artery without mention of cerebral infarction 09/03/2011   Aneurysm of abdominal vessel (HCC) 09/03/2011    Hemochromatosis 08/29/2011   Past Medical History:  Past Medical History:  Diagnosis Date   AAA (abdominal aortic aneurysm) (HCC) 06/29/2007   stent graft   Aortic stenosis 09/05/2017   Mild AS/AR by echo 01/2017.   GERD (gastroesophageal reflux disease)    Hemochromatosis    Hyperlipidemia    Hypertension    Irritable bowel syndrome    Permanent atrial fibrillation (HCC)    Stroke (HCC)    Transient Ischemic Attack   Past Surgical History:  Past Surgical History:  Procedure Laterality Date   ABDOMINAL AORTIC ANEURYSM REPAIR     ENDOVASCULAR STENT INSERTION  07/18/2007   saccular infrarenal aortic aneurysm   EYE SURGERY Bilateral    Cataract     IR FLUORO GUIDE CV LINE LEFT  12/08/2018   IR FLUORO GUIDE CV LINE RIGHT  02/13/2017   IR FLUORO GUIDE CV LINE RIGHT  02/25/2017   IR US GUIDE VASC ACCESS RIGHT  02/13/2017   PICC LINE PLACE PERIPHERAL ([facility] HX)  Oct 21, 2014   HPI:  Patient is an 86 y.o. male with PMH: chronic a-fib, seizure disorder, HTN, HLD, chronic ambulation dysfunction with frequent falls, AAA s/p repair, recent UTI, CVA. He was admitted in August of 2023 following fall onto side of bathtub and ultimately was evaluated and treated by SLP for dysphagia (appeared to be acute on chronic). CT Head showed 1.8 cm hemorrhagic contusion of left temporal lobe as well as overlying SDH and SAH, mild mass effect on the underlying brain parenchyma but no midline shift. He failed Yale swallow with RN and was made NPO awaiting SLP evaluation of swallow function.   Assessment / Plan / Recommendation  Clinical Impression  Patient presents with a severe expressive aphasia and a moderate receptive aphasia as per this evaluation. He was awake and alert for entire assessment, but would frequently give facial expressions to indicate he was frustrated/irritated. He did not exhibit ability to repeat at word level, did not demonstrate automatic speech even with cues, 40% accurate for following  one-step verbal commands, was able to imitate to gesture to nose but did not imitate any other gestures. His strength was in responding to yes/no questions, for which he was 90% accurate for biographical and 65% for basic environmental yes/no questions. His verbalizations were never longer than word-level. When SLP mentioned to patient that he was going to call his son he said, "Why?" and when SLP explained, he then seemed to be irritated. SLP called and left voicemail on patient's daughter's phone and when SLP told patient this, he again seemed irritated. He did not initiate any attempts to communicate either verbally or non-verbally. No family present to determine patient's recent baseline but when he was seen by SLP in August of 2023, it was for dysphagia only. SLP will follow patient while here in acute part of hospital and recommending AIR SLP.    SLP Assessment  SLP Recommendation/Assessment: Patient needs continued Speech Lanaguage Pathology Services SLP Visit Diagnosis: Cognitive communication deficit (R41.841);Aphasia (R47.01)    Recommendations for follow up therapy are one component of a multi-disciplinary discharge planning process, led by the attending physician.  Recommendations may be updated based on patient status, additional functional criteria and insurance authorization.    Follow Up Recommendations  Acute inpatient rehab (3hours/day)    Assistance Recommended at Discharge  Frequent or constant Supervision/Assistance  Functional Status Assessment Patient has had a recent decline in their functional status and demonstrates the ability to make significant improvements in function in a reasonable and predictable amount of time.  Frequency and Duration min 2x/week  2 weeks      SLP Evaluation Cognition  Overall Cognitive Status: No family/caregiver present to determine baseline cognitive functioning Arousal/Alertness: Awake/alert Orientation Level: Oriented to person;Other  (comment) (significantly impaired expressive language prevented assessment of many areas)       Comprehension  Auditory Comprehension Overall Auditory Comprehension: Impaired Yes/No Questions: Impaired Basic Biographical Questions: 76-100% accurate Basic Immediate Environment Questions: 50-74% accurate Commands: Impaired One Step Basic Commands: 25-49% accurate Interfering Components: Processing speed EffectiveTechniques: Repetition;Extra processing time;Visual/Gestural cues Visual Recognition/Discrimination Discrimination: Not tested Reading Comprehension Reading Status: Not tested    Expression Expression Primary Mode of Expression: Verbal Verbal Expression Overall Verbal Expression: Impaired Initiation: Impaired Level of Generative/Spontaneous Verbalization: Word Repetition: Impaired Level of Impairment: Word level Naming: Impairment Responsive: 0-25% accurate Confrontation: Impaired Convergent: Not tested Divergent: Not tested Pragmatics: Impairment Impairments: Abnormal affect Effective Techniques: Other (Comment) (yes/no questions) Written Expression Dominant Hand: Right Written Expression: Not tested   Oral / Motor  Oral Motor/Sensory Function Overall Oral Motor/Sensory Function: Other (comment) (no obvious assymetry of facial, bilabial musculature, patient unable to follow commands to assess lingual ROM) Motor Speech Phonation: Normal            Angela Nevin, MA, CCC-SLP Speech Therapy

## 2022-04-10 NOTE — ED Notes (Signed)
Care assumed for pt.  Pt is lying in bed awake.  Able to follow very basic commands and some yes/no questions.  Cardiac monitors on.  BP monitor on and cycling.  Condom cath intact.

## 2022-04-10 NOTE — ED Notes (Signed)
Pt's medications crushed and put into pudding (pt did not want applesauce).  Pt was able to take meds and he ate a few bites of mashed potato from lunch.  It is noted that while eating, pt did become quite tachycardic in the 140s.  HR resumed back to baseline once finished eating. Pt remains upright.

## 2022-04-11 ENCOUNTER — Inpatient Hospital Stay (HOSPITAL_COMMUNITY): Payer: Medicare Other

## 2022-04-11 DIAGNOSIS — R1312 Dysphagia, oropharyngeal phase: Secondary | ICD-10-CM | POA: Diagnosis not present

## 2022-04-11 DIAGNOSIS — J9601 Acute respiratory failure with hypoxia: Secondary | ICD-10-CM

## 2022-04-11 DIAGNOSIS — I609 Nontraumatic subarachnoid hemorrhage, unspecified: Secondary | ICD-10-CM | POA: Diagnosis not present

## 2022-04-11 LAB — COMPREHENSIVE METABOLIC PANEL
ALT: 10 U/L (ref 0–44)
AST: 12 U/L — ABNORMAL LOW (ref 15–41)
Albumin: 3 g/dL — ABNORMAL LOW (ref 3.5–5.0)
Alkaline Phosphatase: 63 U/L (ref 38–126)
Anion gap: 12 (ref 5–15)
BUN: 13 mg/dL (ref 8–23)
CO2: 23 mmol/L (ref 22–32)
Calcium: 8.7 mg/dL — ABNORMAL LOW (ref 8.9–10.3)
Chloride: 100 mmol/L (ref 98–111)
Creatinine, Ser: 1.16 mg/dL (ref 0.61–1.24)
GFR, Estimated: >60 mL/min (ref >60)
Glucose, Bld: 84 mg/dL (ref 70–99)
Potassium: 3.3 mmol/L — ABNORMAL LOW (ref 3.5–5.1)
Sodium: 135 mmol/L (ref 135–145)
Total Bilirubin: 2.2 mg/dL — ABNORMAL HIGH (ref 0.3–1.2)
Total Protein: 5.9 g/dL — ABNORMAL LOW (ref 6.5–8.1)

## 2022-04-11 LAB — CBC
HCT: 33.1 % — ABNORMAL LOW (ref 39.0–52.0)
Hemoglobin: 11.6 g/dL — ABNORMAL LOW (ref 13.0–17.0)
MCH: 31.3 pg (ref 26.0–34.0)
MCHC: 35 g/dL (ref 30.0–36.0)
MCV: 89.2 fL (ref 80.0–100.0)
Platelets: 213 10*3/uL (ref 150–400)
RBC: 3.71 MIL/uL — ABNORMAL LOW (ref 4.22–5.81)
RDW: 14.5 % (ref 11.5–15.5)
WBC: 7.4 10*3/uL (ref 4.0–10.5)
nRBC: 0 % (ref 0.0–0.2)

## 2022-04-11 MED ORDER — ALUM & MAG HYDROXIDE-SIMETH 200-200-20 MG/5ML PO SUSP: 30.0000 mL | ORAL | Status: DC | PRN

## 2022-04-11 MED ORDER — FUROSEMIDE 10 MG/ML IJ SOLN
20.0000 mg | Freq: Once | INTRAMUSCULAR | Status: AC
  Administered 2022-04-11: 20 mg via INTRAVENOUS
  Filled 2022-04-11: qty 4

## 2022-04-11 MED ORDER — POTASSIUM CHLORIDE 10 MEQ/100ML IV SOLN
10.0000 meq | INTRAVENOUS | Status: AC
  Administered 2022-04-11 (×4): 10 meq via INTRAVENOUS
  Filled 2022-04-11: qty 100

## 2022-04-11 NOTE — Plan of Care (Signed)
  Problem: Nutrition: Goal: Risk of aspiration will decrease Outcome: Progressing   Problem: Clinical Measurements: Goal: Diagnostic test results will improve Outcome: Progressing Goal: Respiratory complications will improve Outcome: Progressing Goal: Cardiovascular complication will be avoided Outcome: Progressing   Problem: Activity: Goal: Risk for activity intolerance will decrease Outcome: Progressing

## 2022-04-11 NOTE — Evaluation (Signed)
Occupational Therapy Evaluation Patient Details Name: Eugene [name] MRN: [id number] DOB: [birth year] Today's Date: 04/11/2022   History of Present Illness Eugene [name] is a 86 y.o. male with medical history significant of chronic A-fib on Eliquis, seizure disorder on Keppra, hemochromatosis, HTN, HLD, chronic ambulation dysfunction with frequent falls, AAA s/p repair, recent UTI, came with fall and strokelike symptoms.  MRI positive for 1.8 cm hemorrhagic contusion in the left anterior temporal lobe  as well as overlying subdural and subarachnoid hemorrhage. Mild mass  effect on the underlying brain parenchyma but no midline shift.   Clinical Impression   Prior to this admission, patient was ambulating at supervision level with a RW, and receiving assistance for showering, lower body dressing, and higher level ADL tasks. Currently, patient demonstrating differences in cognition, significant expressive difficulties, need for mod A for bed mobility and mod-total A to complete ADL tasks. Patient unable to transition into standing with OT (family not present and patient grimacing in pain, and unable to consistently answer yes/no questions when asked about pain). OT recommendation for AIR rehab, given current level prior to admission, family support, and ability to improve with intensive rehab. OT will continue to follow acutely.       Recommendations for follow up therapy are one component of a multi-disciplinary discharge planning process, led by the attending physician.  Recommendations may be updated based on patient status, additional functional criteria and insurance authorization.   Follow Up Recommendations  Acute inpatient rehab (3hours/day)     Assistance Recommended at Discharge Frequent or constant Supervision/Assistance  Patient can return home with the following Two people to help with walking and/or transfers;A lot of help with bathing/dressing/bathroom;Assistance with  cooking/housework;Assistance with feeding;Direct supervision/assist for medications management;Direct supervision/assist for financial management;Assist for transportation;Help with stairs or ramp for entrance    Functional Status Assessment  Patient has had a recent decline in their functional status and demonstrates the ability to make significant improvements in function in a reasonable and predictable amount of time.  Equipment Recommendations  Other (comment) (Defer to next venue)    Recommendations for Other Services Rehab consult     Precautions / Restrictions Precautions Precautions: Fall      Mobility Bed Mobility Overal bed mobility: Needs Assistance Bed Mobility: Supine to Sit, Sit to Supine     Supine to sit: HOB elevated, Mod assist Sit to supine: Mod assist   General bed mobility comments: assist for legs off bed and to lift trunk, to supine with assist to lift legs    Transfers                   General transfer comment: declined sit to stand today due to pain      Balance Overall balance assessment: Needs assistance Sitting-balance support: Feet supported Sitting balance-Leahy Scale: Poor Sitting balance - Comments: requring external assist from OT to maintain sitting balance Postural control: Left lateral lean, Posterior lean                                 ADL either performed or assessed with clinical judgement   ADL Overall ADL's : Needs assistance/impaired Eating/Feeding: NPO   Grooming: Moderate assistance;Sitting   Upper Body Bathing: Moderate assistance;Maximal assistance;Sitting   Lower Body Bathing: Maximal assistance;Total assistance;Sitting/lateral leans;Sit to/from stand   Upper Body Dressing : Moderate assistance;Sitting   Lower Body Dressing: Maximal assistance;Total assistance;Sitting/lateral leans;Sit to/from stand  Toilet Transfer Details (indicate cue type and reason): unable to progress into standing  to date with OT Toileting- Clothing Manipulation and Hygiene: Total assistance;Bed level Toileting - Clothing Manipulation Details (indicate cue type and reason): rolling back and forth to complete peri-care             Vision   Additional Comments: Son not present for OT session, will continue to assess when family is present     Perception     Praxis      Pertinent Vitals/Pain Pain Assessment Pain Assessment: Faces Faces Pain Scale: Hurts little more Pain Location: back, head Pain Descriptors / Indicators: Grimacing, Guarding Pain Intervention(s): Limited activity within patient's tolerance, Monitored during session, Repositioned     Hand Dominance Right   Extremity/Trunk Assessment Upper Extremity Assessment Upper Extremity Assessment: Generalized weakness;LUE deficits/detail LUE Deficits / Details: son reports L shoulder pain at baseline LUE: Unable to fully assess due to pain   Lower Extremity Assessment Lower Extremity Assessment: Defer to PT evaluation   Cervical / Trunk Assessment Cervical / Trunk Assessment: Kyphotic   Communication Communication Communication: HOH;Expressive difficulties;Receptive difficulties (severe expressive & moderate receptive aphasia)   Cognition Arousal/Alertness: Awake/alert Behavior During Therapy: Flat affect Overall Cognitive Status: Difficult to assess Area of Impairment: Attention, Following commands                   Current Attention Level: Focused   Following Commands: Follows one step commands inconsistently, Follows one step commands with increased time       General Comments: Inconistent answering of yes/no questions     General Comments  HR up to 153 in session (Afib) RN notified    Exercises     Shoulder Instructions      Home Living Family/patient expects to be discharged to:: Private residence Living Arrangements: Non-relatives/Friends Available Help at Discharge: Family;Personal care  attendant;Available 24 hours/day Type of Home: House Home Access: Level entry     Home Layout: One level     Bathroom Shower/Tub: Occupational psychologist: Handicapped height     Home Equipment: Conservation officer, nature (2 wheels);Grab bars - tub/shower;Shower seat - built in;Toilet riser   Additional Comments: lift chair      Prior Functioning/Environment Prior Level of Function : Needs assist             Mobility Comments: pt was amb with RW with supervision, pt able to take self to bathroom, no driving, no cooking ADLs Comments: assist for showering, dressing        OT Problem List: Decreased strength;Decreased range of motion;Decreased activity tolerance;Impaired balance (sitting and/or standing);Decreased coordination;Decreased cognition;Decreased safety awareness;Decreased knowledge of use of DME or AE;Decreased knowledge of precautions;Pain      OT Treatment/Interventions: Therapeutic exercise;Neuromuscular education;Self-care/ADL training;Energy conservation;DME and/or AE instruction;Manual therapy;Patient/family education;Balance training;Cognitive remediation/compensation;Therapeutic activities    OT Goals(Current goals can be found in the care plan section) Acute Rehab OT Goals Patient Stated Goal: unable to state OT Goal Formulation: With patient Time For Goal Achievement: 04/25/22 Potential to Achieve Goals: Fair ADL Goals Pt Will Perform Lower Body Bathing: with mod assist;sitting/lateral leans;sit to/from stand Pt Will Perform Lower Body Dressing: with mod assist;sit to/from stand;sitting/lateral leans Pt Will Transfer to Toilet: with mod assist;stand pivot transfer;bedside commode Pt Will Perform Toileting - Clothing Manipulation and hygiene: with mod assist;sitting/lateral leans;sit to/from stand Additional ADL Goal #1: Patient will demonstrate increased cognition to follow 1-2 step commands consistently in preparation for ADL tasks. Additional ADL  Goal  #2: Patient will be able to maintain balance seated EOB without external assist for 5 minutes while completing functional task in preparation for OOB ADL activities.  OT Frequency: Min 2X/week    Co-evaluation              AM-PAC OT "6 Clicks" Daily Activity     Outcome Measure Help from another person eating meals?: Total (NPO) Help from another person taking care of personal grooming?: A Lot Help from another person toileting, which includes using toliet, bedpan, or urinal?: A Lot Help from another person bathing (including washing, rinsing, drying)?: A Lot Help from another person to put on and taking off regular upper body clothing?: A Lot Help from another person to put on and taking off regular lower body clothing?: Total 6 Click Score: 10   End of Session    Activity Tolerance:   Patient left:    OT Visit Diagnosis: Unsteadiness on feet (R26.81);Other abnormalities of gait and mobility (R26.89);Muscle weakness (generalized) (M62.81);Other symptoms and signs involving cognitive function;Pain Pain - Right/Left: Left Pain - part of body: Shoulder                Time: 3007-6226 OT Time Calculation (min): 24 min Charges:  OT General Charges $OT Visit: 1 Visit OT Evaluation $OT Eval Moderate Complexity: 1 Mod OT Treatments $Self Care/Home Management : 8-22 mins  Pollyann Glen E. Maynard David, OTR/L Acute Rehabilitation Services [phone]   Cherlyn Cushing 04/11/2022, 12:08 PM

## 2022-04-11 NOTE — Progress Notes (Addendum)
[facility] PROGRESS NOTE   Eugene [name] ZOX:[id number] DOB: [birth year] DOA: 04/09/2022  PCP: Georgann HousekeeperHusain, Karrar, MD  Brief History/Interval Summary: 86 y.o. male with medical history significant of chronic A-fib on Eliquis, seizure disorder on Keppra, hemochromatosis, HTN, HLD, chronic ambulation dysfunction with frequent falls, AAA s/p repair, recent UTI, came with fall and strokelike symptoms.  Patient was found to have subarachnoid hemorrhage and subdural hematoma.  He was hospitalized for further management.  Consultants: Neurosurgery  Procedures: EEG    Subjective/Interval History: Patient with expressive aphasia.  Unable to communicate effectively.  Does not appear to be in any discomfort.     Assessment/Plan:  Acute subarachnoid hemorrhage/subdural hematoma, traumatic Secondary to fall that he sustained a week prior to admission.  Patient presented with strokelike symptoms including expressive aphasia and left 7th nerve palsy. Patient was on Eliquis for atrial fibrillation.  Currently on hold.  Does not appear that any reversal agents were used. Patient started on nimodipine for subarachnoid hemorrhage. Neurosurgery was consulted.  CT head was repeated today and does not show any changes compared to previous CT.  Discussed with Dr. Yetta BarreJones with neurosurgery who will round on the patient later today. PT and OT evaluation is pending. Patient also being seen by speech therapy as patient appears to have evidence for dysphagia. Patient with known history of seizures.  Continue Keppra.  Acute respiratory failure with oxygen Patient appears to have aspirated yesterday when he was given his meals and pills.  His saturations dropped into the 80s.  He is currently requiring 2 to 3 L of oxygen by nasal cannula.  Does not appear to be tachypneic.  We will repeat a chest x-ray today. Due to significant secretions that was noted yesterday patient was started on scopolamine  patch.  Oropharyngeal dysphagia Speech therapy to reevaluate today perhaps with a modified barium swallow.  Chronic atrial fibrillation Eliquis is on hold. As needed IV metoprolol can be used for tachycardia.  Hopefully we can resume his oral medications today depending on how he does with his swallow evaluation.  Essential hypertension Use as needed agents for now.  Hopefully can resume his scheduled medications once cleared by speech therapy.  Hypokalemia Will be repleted.  Magnesium was 2.0 yesterday.  History of seizure disorder Continue Keppra intravenously for now.  Left-sided pleural effusion Appears to be asymptomatic.  Acute on chronic.  Continue to monitor for now.  Obesity Estimated body mass index is 31.32 kg/m as calculated from the following:   Height as of 02/28/22: 5\' 5"  (1.651 m).   Weight as of 02/28/22: 85.4 kg.   DVT Prophylaxis: SCDs Code Status: DNR Family Communication: No family at bedside Disposition Plan: To be determined  Status is: Inpatient Remains inpatient appropriate because: Subarachnoid hemorrhage/subdural hematoma      Medications: Scheduled:   stroke: early stages of recovery book   Does not apply Once   cephALEXin  500 mg Oral BID   docusate  100 mg Oral QODAY   famotidine  20 mg Oral BID   metoprolol tartrate  50 mg Oral BID   niMODipine  60 mg Oral Q4H   scopolamine  1 patch Transdermal Q72H   tamsulosin  0.4 mg Oral QHS   Continuous:  sodium chloride 75 mL/hr at 04/11/22 0626   levETIRAcetam Stopped (04/10/22 2321)   potassium chloride     WJX:BJYNWGNFAOZHYPRN:acetaminophen **OR** acetaminophen (TYLENOL) oral liquid 160 mg/5 mL **OR** acetaminophen, diclofenac Sodium, hydrALAZINE, metoprolol tartrate, polyethylene glycol  Antibiotics: Anti-infectives (  From admission, onward)    Start     Dose/Rate Route Frequency Ordered Stop   04/09/22 2200  cephALEXin (KEFLEX) capsule 500 mg        500 mg Oral 2 times daily 04/09/22 1742          Objective:  Vital Signs  Vitals:   04/10/22 2100 04/10/22 2326 04/11/22 0401 04/11/22 0719  BP:  111/62 (!) 145/73 (!) 152/74  Pulse:  83 84 (!) 111  Resp:  Temp: 99 F (37.2 C) 99.3 F (37.4 C) 98.4 F (36.9 C) 98.1 F (36.7 C)  TempSrc:  Oral Oral Oral  SpO2:  95% 94% 91%    Intake/Output Summary (Last 24 hours) at 04/11/2022 0945 Last data filed at 04/11/2022 1610 Gross per 24 hour  Intake 1000 ml  Output 1500 ml  Net -500 ml    There were no vitals filed for this visit.  General appearance: Awake alert.  In no distress Resp: Normal effort at rest.  Air entry at the bases.  No wheezing or rhonchi.  Few crackles. Cardio: S1-S2 is normal regular.  No S3-S4.  No rubs murmurs or bruit GI: Abdomen is soft.  Nontender nondistended.  Bowel sounds are present normal.  No masses organomegaly Extremities: No edema.  All of his extremities.  Physical deconditioning is noted.   Lab Results:  Data Reviewed: I have personally reviewed following labs and reports of the imaging studies  CBC: Recent Labs  Lab 04/09/22 1521 04/11/22 0606  WBC 7.8 7.4  NEUTROABS 5.5  --   HGB 12.0* 11.6*  HCT 36.8* 33.1*  MCV 94.6 89.2  PLT 228 213     Basic Metabolic Panel: Recent Labs  Lab 04/09/22 1521 04/11/22 0606  NA 133* 135  K 4.1 3.3*  CL 95* 100  CO2 24 23  GLUCOSE 99 84  BUN 14 13  CREATININE 1.13 1.16  CALCIUM 9.2 8.7*  MG 2.0  --      GFR: CrCl cannot be calculated (Unknown ideal weight.).  Liver Function Tests: Recent Labs  Lab 04/09/22 1521 04/11/22 0606  AST 17 12*  ALT 11 10  ALKPHOS 72 63  BILITOT 1.5* 2.2*  PROT 6.3* 5.9*  ALBUMIN 3.4* 3.0*      Recent Labs  Lab 04/09/22 1521  AMMONIA 17     Coagulation Profile: Recent Labs  Lab 04/09/22 1521  INR 1.6*      CBG: Recent Labs  Lab 04/09/22 1437  GLUCAP 94     Lipid Profile: Recent Labs    04/10/22 0652  CHOL 118  HDL 45  LDLCALC 64  TRIG 45   CHOLHDL 2.6     Thyroid Function Tests: Recent Labs    04/09/22 1521  TSH 2.100      Radiology Studies: CT HEAD WO CONTRAST ( )  Result Date: 04/11/2022 CLINICAL DATA:  Subarachnoid hemorrhage (SAH) subdural hematoma, SAH EXAM: CT HEAD WITHOUT CONTRAST TECHNIQUE: Contiguous axial images were obtained from the base of the skull through the vertex without intravenous contrast. RADIATION DOSE REDUCTION: This exam was performed according to the departmental dose-optimization program which includes automated exposure control, adjustment of the mA and/or kV according to patient size and/or use of iterative reconstruction technique. COMPARISON:  CT head November 13, 23. FINDINGS: Brain: Unchanged hemorrhagic contusion in the anterior left temporal lobe with overlying extra-axial hemorrhage. Similar mild mass effect. Vascular: No hyperdense vessel identified. Skull: No acute fracture. Sinuses/Orbits: Clear sinuses.  No acute orbital findings. Other: No mastoid effusions. IMPRESSION: Unchanged hemorrhagic contusion in the anterior left temporal lobe with overlying extra-axial hemorrhage. Similar mild mass effect. No significant midline shift. Evidence of acute large vascular territory infarct, mass lesion, or hydrocephalus. Cerebral atrophy and ex vacuo ventricular dilation. Electronically Signed   By: Feliberto Harts M.D.   On: 04/11/2022 09:07   EEG adult  Result Date: 04/10/2022 Charlsie Quest, MD     04/10/2022  8:50 AM Patient Name: JENNY OMDAHL MRN: [id number] Epilepsy Attending: Charlsie Quest Referring Physician/Provider: Gloris Manchester, MD Date:04/09/2022 Duration: 22.23 mins Patient history: 86 year old male with seizures, now with subdural hematoma and subarachnoid hemorrhage.  EEG to evaluate for seizure. Level of alertness: Awake AEDs during EEG study: Xanax, LEV Technical aspects: This EEG study was done with scalp electrodes positioned according to the 10-20 International  system of electrode placement. Electrical activity was reviewed with band pass filter of 1-70Hz , sensitivity of 7 uV/mm, display speed of 7mm/sec with a 60Hz  notched filter applied as appropriate. EEG data were recorded continuously and digitally stored.  Video monitoring was available and reviewed as appropriate. Description: The posterior dominant rhythm consists of 8 Hz activity of moderate voltage (25-35 uV) seen predominantly in posterior head regions, symmetric and reactive to eye opening and eye closing.  2 to 3 Hz rhythmic, sharply contoured delta slowing was seen in left hemisphere, maximal left temporal region. Intermittent generalized 3 to 5-second episode was also noted.  Hyperventilation and photic stimulation were not performed.   ABNORMALITY - Lateralized rhythmic delta activity, left hemisphere - Intermittent slow, generalized IMPRESSION: This study is suggestive of cortical dysfunction in left hemisphere most likely secondary to underlying structural abnormality.  Additionally there is mild diffuse encephalopathy, nonspecific etiology.  No seizures or definite epileptiform discharges were seen throughout the recording.   CT Head Wo Contrast  Result Date: 04/09/2022 CLINICAL DATA:  Trauma EXAM: CT HEAD WITHOUT CONTRAST CT CERVICAL SPINE WITHOUT CONTRAST TECHNIQUE: Multidetector CT imaging of the head and cervical spine was performed following the standard protocol without intravenous contrast. Multiplanar CT image reconstructions of the cervical spine were also generated. RADIATION DOSE REDUCTION: This exam was performed according to the departmental dose-optimization program which includes automated exposure control, adjustment of the mA and/or kV according to patient size and/or use of iterative reconstruction technique. COMPARISON:  CT head and cervical spine 08/25/2021, MR head 08/26/2021 FINDINGS: CT HEAD FINDINGS Brain: There is a hemorrhagic contusion in the left  temporal lobe measuring 1.8 cm AP x 1.2 cm TV x 1.1 cm cc with mild surrounding edema. There is overlying subarachnoid hemorrhage as well as subdural hematoma centered over the temporal lobe measuring up to 7 mm in maximal thickness. Additional subdural blood is seen layering along the falx posteriorly and left tentorial leaflet measuring up to 4 mm in thickness. Is no definite intraventricular hemorrhage. The subdural blood exerts mild mass effect on the underlying brain parenchyma with no midline shift. There is no evidence of acute territorial infarct. Background parenchymal volume is stable. The ventricles are stable in size. Hypodensity in the supratentorial white matter consistent with chronic small vessel ischemic change is stable. There is no mass lesion.  There is no mass effect or midline shift. Vascular: No hyperdense vessel or unexpected calcification. Skull: Normal. Negative for fracture or focal lesion. Sinuses/Orbits: The paranasal sinuses are clear. Bilateral lens implants are in place. The globes and orbits are otherwise unremarkable. Other: None. CT CERVICAL  SPINE FINDINGS Alignment: Normal. There is no jumped or perched facet or other evidence of traumatic malalignment. Skull base and vertebrae: Skull base alignment is maintained. Vertebral body heights are preserved. There is no evidence of acute fracture. There is no suspicious osseous lesion. Soft tissues and spinal canal: No prevertebral fluid or swelling. No visible canal hematoma. Disc levels: There is overall mild multilevel disc space narrowing and degenerative endplate change. Facet arthropathy is most advanced on the left at C3-C4 and on the right at C5-C6. There is no evidence of high-grade spinal canal stenosis. Upper chest: There is background emphysema in the lung apices. There is an at least moderate-sized left pleural effusion. Other: None. IMPRESSION: 1. 1.8 cm hemorrhagic contusion in the left anterior temporal lobe as well as  overlying subdural and subarachnoid hemorrhage. Mild mass effect on the underlying brain parenchyma but no midline shift. 2. No acute fracture or traumatic malalignment of the cervical spine. 3. Moderate-sized left pleural effusion. Critical Value/emergent results were called by telephone at the time of interpretation on 04/09/2022 at 3:18 pm to provider HAYLEY NAASZ , who verbally acknowledged these results. Electronically Signed   By: Lesia Hausen M.D.   On: 04/09/2022 15:39   CT Cervical Spine Wo Contrast  Result Date: 04/09/2022 CLINICAL DATA:  Trauma EXAM: CT HEAD WITHOUT CONTRAST CT CERVICAL SPINE WITHOUT CONTRAST TECHNIQUE: Multidetector CT imaging of the head and cervical spine was performed following the standard protocol without intravenous contrast. Multiplanar CT image reconstructions of the cervical spine were also generated. RADIATION DOSE REDUCTION: This exam was performed according to the departmental dose-optimization program which includes automated exposure control, adjustment of the mA and/or kV according to patient size and/or use of iterative reconstruction technique. COMPARISON:  CT head and cervical spine 08/25/2021, MR head 08/26/2021 FINDINGS: CT HEAD FINDINGS Brain: There is a hemorrhagic contusion in the left temporal lobe measuring 1.8 cm AP x 1.2 cm TV x 1.1 cm cc with mild surrounding edema. There is overlying subarachnoid hemorrhage as well as subdural hematoma centered over the temporal lobe measuring up to 7 mm in maximal thickness. Additional subdural blood is seen layering along the falx posteriorly and left tentorial leaflet measuring up to 4 mm in thickness. Is no definite intraventricular hemorrhage. The subdural blood exerts mild mass effect on the underlying brain parenchyma with no midline shift. There is no evidence of acute territorial infarct. Background parenchymal volume is stable. The ventricles are stable in size. Hypodensity in the supratentorial white matter  consistent with chronic small vessel ischemic change is stable. There is no mass lesion.  There is no mass effect or midline shift. Vascular: No hyperdense vessel or unexpected calcification. Skull: Normal. Negative for fracture or focal lesion. Sinuses/Orbits: The paranasal sinuses are clear. Bilateral lens implants are in place. The globes and orbits are otherwise unremarkable. Other: None. CT CERVICAL SPINE FINDINGS Alignment: Normal. There is no jumped or perched facet or other evidence of traumatic malalignment. Skull base and vertebrae: Skull base alignment is maintained. Vertebral body heights are preserved. There is no evidence of acute fracture. There is no suspicious osseous lesion. Soft tissues and spinal canal: No prevertebral fluid or swelling. No visible canal hematoma. Disc levels: There is overall mild multilevel disc space narrowing and degenerative endplate change. Facet arthropathy is most advanced on the left at C3-C4 and on the right at C5-C6. There is no evidence of high-grade spinal canal stenosis. Upper chest: There is background emphysema in the lung apices. There is  an at least moderate-sized left pleural effusion. Other: None. IMPRESSION: 1. 1.8 cm hemorrhagic contusion in the left anterior temporal lobe as well as overlying subdural and subarachnoid hemorrhage. Mild mass effect on the underlying brain parenchyma but no midline shift. 2. No acute fracture or traumatic malalignment of the cervical spine. 3. Moderate-sized left pleural effusion. Critical Value/emergent results were called by telephone at the time of interpretation on 04/09/2022 at 3:18 pm to provider HAYLEY NAASZ , who verbally acknowledged these results. Electronically Signed   By: Lesia Hausen M.D.   On: 04/09/2022 15:39   DG Chest Portable 1 View  Result Date: 04/09/2022 CLINICAL DATA:  Fall, altered mental status. EXAM: PORTABLE CHEST 1 VIEW COMPARISON:  Chest radiograph dated February 28, 2022 FINDINGS: The heart is  enlarged. Prominent aortic atherosclerotic calcifications. Right lung is clear. Left basilar opacity suggesting atelectasis and/small effusion, unchanged. IMPRESSION: Left basilar opacity suggesting atelectasis and/small effusion, unchanged. Electronically Signed   By: Larose Hires D.O.   On: 04/09/2022 15:32       LOS: 2 days   Theo Krumholz Rito Ehrlich  [facility] Pager on [email or web address]  04/11/2022, 9:45 AM

## 2022-04-11 NOTE — Progress Notes (Signed)
Inpatient Rehab Admissions Coordinator:   Per PT recommendations patient was screened for CIR candidacy by Megan Salon, MS, CCC-SLP ). At this time, Pt. is not yet tolerating OOB and has not demonstrated ability to tolerate intensity of CIR; however,  Pt. may have potential to progress to becoming a potential CIR candidate, so CIR admissions team will follow and monitor for progress and participation with therapies and place consult order if Pt. appears to be an appropriate candidate. Please contact me with any questions.    Megan Salon, MS, CCC-SLP Rehab Admissions Coordinator  [phone] (celll) [phone] (office)

## 2022-04-11 NOTE — Progress Notes (Signed)
Patient ID: Eugene [name], male   DOB: 13-Jun-1935, 86 y.o.   MRN: [id number] Patient seen and examined.  He is sitting up on the side of the bed awake and alert and has some verbalization which is an improvement.  He has difficulty naming objects.  He has some fluency at times.  Does follow commands.  He moves all extremities well.  Reviewed his follow-up head CT and it is stable with a small left temporal contusion and a small temporal subdural hematoma which extends back to the falx and the tentorium.  No mass effect or shift.  No need for neurosurgical intervention.  He is likely going to need either inpatient rehab or skilled nursing facility placement.  Hopefully the aphasia will improve with time as the contusion improves.  Hopefully the neurons will recover.  I spoke to his son.  We will sign off as no acute neurosurgical intervention is needed.  Likely okay to restart his oral anticoagulant in 7 to 10 days.  Would have a follow-up head CT in 7 to 10 days if he is admitted here to inpatient rehab.  Otherwise follow-up with me in the office 2 weeks after discharge

## 2022-04-11 NOTE — Plan of Care (Signed)

## 2022-04-11 NOTE — Evaluation (Signed)
Physical Therapy Evaluation Patient Details Name: Eugene [name] MRN: [id number] DOB: 1936-02-21 Today's Date: 04/11/2022  History of Present Illness  Eugene [name] is a 86 y.o. male with medical history significant of chronic A-fib on Eliquis, seizure disorder on Keppra, hemochromatosis, HTN, HLD, chronic ambulation dysfunction with frequent falls, AAA s/p repair, recent UTI, came with fall and strokelike symptoms.  MRI positive for 1.8 cm hemorrhagic contusion in the left anterior temporal lobe  as well as overlying subdural and subarachnoid hemorrhage. Mild mass  effect on the underlying brain parenchyma but no midline shift.  Clinical Impression  Patient presents with decreased mobility due to generalized weakness, pain, decreased activity tolerance, decreased balance and needing up to mod A for bed mobility.  He declined to stand/transfer today.  Son in the room and supportive and reports pt usually ambulates with S with RW and some assist at times for transfers though has a lift chair.  Feel he may benefit from stay on AIR prior to d/c home with 24 hour caregivers.  PT will continue to follow acutely.        Recommendations for follow up therapy are one component of a multi-disciplinary discharge planning process, led by the attending physician.  Recommendations may be updated based on patient status, additional functional criteria and insurance authorization.  Follow Up Recommendations Acute inpatient rehab (3hours/day)      Assistance Recommended at Discharge Frequent or constant Supervision/Assistance  Patient can return home with the following  A lot of help with walking and/or transfers;Assist for transportation;Help with stairs or ramp for entrance;A lot of help with bathing/dressing/bathroom    Equipment Recommendations Hospital bed  Recommendations for Other Services  Rehab consult    Functional Status Assessment Patient has had a recent decline in their functional  status and demonstrates the ability to make significant improvements in function in a reasonable and predictable amount of time.     Precautions / Restrictions Precautions Precautions: Fall      Mobility  Bed Mobility Overal bed mobility: Needs Assistance Bed Mobility: Supine to Sit, Sit to Supine     Supine to sit: HOB elevated, Mod assist Sit to supine: Mod assist   General bed mobility comments: assist for legs off bed and to lift trunk, to supine with assist to lift legs    Transfers                   General transfer comment: declined sit to stand today due to pain    Ambulation/Gait                  Stairs            Wheelchair Mobility    Modified Rankin (Stroke Patients Only) Modified Rankin (Stroke Patients Only) Pre-Morbid Rankin Score: Moderately severe disability Modified Rankin: Severe disability     Balance Overall balance assessment: Needs assistance Sitting-balance support: Feet supported Sitting balance-Leahy Scale: Fair Sitting balance - Comments: on EOB without UE support about 5 minutes                                     Pertinent Vitals/Pain Pain Assessment Pain Assessment: Faces Faces Pain Scale: Hurts little more Pain Location: back, head Pain Descriptors / Indicators: Grimacing, Guarding Pain Intervention(s): Monitored during session, Repositioned    Home Living Family/patient expects to be discharged to:: Private residence Living Arrangements: Non-relatives/Friends Available  Help at Discharge: Family;Personal care attendant;Available 24 hours/day Type of Home: House Home Access: Level entry       Home Layout: One level Home Equipment: Rolling Walker (2 wheels);Grab bars - tub/shower;Shower seat - built in;Toilet riser Additional Comments: lift chair    Prior Function Prior Level of Function : Needs assist             Mobility Comments: pt was amb with RW with supervision, pt able to  take self to bathroom, no driving, no cooking ADLs Comments: assist for showering, dressing     Hand Dominance   Dominant Hand: Right    Extremity/Trunk Assessment   Upper Extremity Assessment Upper Extremity Assessment: Generalized weakness;LUE deficits/detail LUE Deficits / Details: son reports L shoulder pain at baseline    Lower Extremity Assessment Lower Extremity Assessment: Generalized weakness    Cervical / Trunk Assessment Cervical / Trunk Assessment: Kyphotic  Communication   Communication: HOH;Expressive difficulties;Receptive difficulties (severe expressive & moderate receptive aphasia)  Cognition Arousal/Alertness: Awake/alert Behavior During Therapy: Flat affect Overall Cognitive Status: Difficult to assess Area of Impairment: Attention, Following commands                   Current Attention Level: Focused   Following Commands: Follows one step commands inconsistently, Follows one step commands with increased time                General Comments General comments (skin integrity, edema, etc.): son in the room and supportive, hopeful he can return home, feels he may need an adjustable bed    Exercises     Assessment/Plan    PT Assessment Patient needs continued PT services  PT Problem List Decreased strength;Decreased balance;Decreased cognition;Decreased knowledge of precautions;Pain;Cardiopulmonary status limiting activity;Decreased mobility;Decreased activity tolerance;Decreased safety awareness       PT Treatment Interventions DME instruction;Functional mobility training;Balance training;Patient/family education;Therapeutic activities;Gait training;Therapeutic exercise    PT Goals (Current goals can be found in the Care Plan section)  Acute Rehab PT Goals Patient Stated Goal: per son to return home PT Goal Formulation: With patient/family Time For Goal Achievement: 04/25/22 Potential to Achieve Goals: Fair    Frequency Min 3X/week      Co-evaluation               AM-PAC PT "6 Clicks" Mobility  Outcome Measure Help needed turning from your back to your side while in a flat bed without using bedrails?: A Lot Help needed moving from lying on your back to sitting on the side of a flat bed without using bedrails?: A Lot Help needed moving to and from a bed to a chair (including a wheelchair)?: Total Help needed standing up from a chair using your arms (e.g., wheelchair or bedside chair)?: Total Help needed to walk in hospital room?: Total Help needed climbing 3-5 steps with a railing? : Total 6 Click Score: 8    End of Session Equipment Utilized During Treatment: Oxygen Activity Tolerance: Patient limited by fatigue;Patient limited by pain Patient left: in bed;with call bell/phone within reach;with bed alarm set;with family/visitor present   PT Visit Diagnosis: Muscle weakness (generalized) (M62.81);Other abnormalities of gait and mobility (R26.89);History of falling (Z91.81);Other symptoms and signs involving the nervous system (R29.898)    Time: 1020-1055 PT Time Calculation (min) (ACUTE ONLY): 35 min   Charges:   PT Evaluation $PT Eval Moderate Complexity: 1 Mod PT Treatments $Therapeutic Activity: 8-22 mins        Sheran Lawless, PT Acute Rehabilitation  Services Office:[phone] 04/11/2022   Elray Mcgregor 04/11/2022, 11:51 AM

## 2022-04-11 NOTE — Progress Notes (Signed)
Speech Language Pathology Treatment: Dysphagia  Patient Details Name: Eugene [name] MRN: [id number] DOB: [birth year] Today's Date: 04/11/2022 Time: 0945-1000 SLP Time Calculation (min) (ACUTE ONLY): 15 min  Assessment / Plan / Recommendation Clinical Impression  Patient seen by SLP for skilled treatment focused on dysphagia goals. Patient was awake, alert and verbally communicating a little better than previous date. (Communicating with some phrases; still with expressive aphasia) He appeared somewhat fatigued (had just completed OT session) but he was agreeable to PO trials. When SLP raising HOB, patient did indicate some pain and requested no further elevation when approximately 60-70 degrees. He then consumed thin liquids (water) via cup sips. SLP suspects mild swallow initiation delay but no overt s/s aspiration or penetration observed. Patient did exhibit frequent belching which is consistent with his premorbid diagnosis of GERD as well as findings of retrograde movement of barium boluses during MBS back in August 2023. Patient then ate cup of applesauce and aside from continued belching, no overt s/s aspiration or penetration and patient without complaints. SLP is recommending to initiate full liquids diet at this time and will follow patient for diet toleration, ability to advance solids and need for objective swallow study (MBS).    HPI HPI: Patient is an 86 y.o. male with PMH: chronic a-fib, seizure disorder, HTN, HLD, chronic ambulation dysfunction with frequent falls, AAA s/p repair, recent UTI, CVA. He was admitted in August of 2023 following fall onto side of bathtub and ultimately was evaluated and treated by SLP for dysphagia (appeared to be acute on chronic). CT Head showed 1.8 cm hemorrhagic contusion of left temporal lobe as well as overlying SDH and SAH, mild mass effect on the underlying brain parenchyma but no midline shift. He failed Yale swallow with RN and was made NPO awaiting  SLP evaluation of swallow function.      SLP Plan  Continue with current plan of care      Recommendations for follow up therapy are one component of a multi-disciplinary discharge planning process, led by the attending physician.  Recommendations may be updated based on patient status, additional functional criteria and insurance authorization.    Recommendations  Diet recommendations: Thin liquid;Other(comment) (full liquids) Liquids provided via: Cup;No straw Medication Administration: Other (Comment) (crushed in puree if large, if small, whole in puree) Supervision: Patient able to self feed;Full supervision/cueing for compensatory strategies Compensations: Slow rate;Small sips/bites Postural Changes and/or Swallow Maneuvers: Seated upright 90 degrees;Upright 30-60 min after meal                General recommendations: Rehab consult Oral Care Recommendations: Oral care BID Follow Up Recommendations: Acute inpatient rehab (3hours/day) Assistance recommended at discharge: Frequent or constant Supervision/Assistance SLP Visit Diagnosis: Dysphagia, unspecified (R13.10) Plan: Continue with current plan of care           Angela Nevin, MA, CCC-SLP Speech Therapy

## 2022-04-11 NOTE — TOC Initial Note (Signed)
Transition of Care [facility]) - Initial/Assessment Note    Patient Details  Name: Eugene [name] MRN: [id number] Date of Birth: 1935/12/02  Transition of Care [facility]) CM/SW Contact:    Kermit Balo, RN Phone Number: 04/11/2022, 3:19 PM  Clinical Narrative:                 Pt is from home with 24 hour caregivers. There are 5 people that participate in his care.  Daughter oversees his medications and provides needed transportation.  They are hoping for CIR admit.  TOC following.  Expected Discharge Plan: IP Rehab Facility Barriers to Discharge: Continued Medical Work up   Patient Goals and CMS Choice   CMS Medicare.gov Compare Post Acute Care list provided to:: Patient Represenative (must comment) Choice offered to / list presented to : Adult Children  Expected Discharge Plan and Services Expected Discharge Plan: IP Rehab Facility     Post Acute Care Choice: IP Rehab Living arrangements for the past 2 months: Single Family Home                                      Prior Living Arrangements/Services Living arrangements for the past 2 months: Single Family Home Lives with:: Other (Comment) Patient language and need for interpreter reviewed:: Yes Do you feel safe going back to the place where you live?: Yes        Care giver support system in place?: Yes (comment) Current home services: DME (shower seat/ wheelchair/ walker) Criminal Activity/Legal Involvement Pertinent to Current Situation/Hospitalization: No - Comment as needed  Activities of Daily Living      Permission Sought/Granted                  Emotional Assessment Appearance:: Appears stated age Attitude/Demeanor/Rapport:  (quiet) Affect (typically observed): Quiet Orientation: : Oriented to Self   Psych Involvement: No (comment)  Admission diagnosis:  SAH (subarachnoid hemorrhage) (HCC) [I60.9] Expressive aphasia [R47.01] Traumatic subarachnoid hemorrhage with unknown loss of  consciousness status, initial encounter (HCC) [S06.6XAA] Patient Active Problem List   Diagnosis Date Noted   SAH (subarachnoid hemorrhage) (HCC) 04/09/2022   Subdural hematoma (HCC) 04/09/2022   Pleural effusion 02/28/2022   CHF (congestive heart failure) (HCC) 02/28/2022   Anemia 02/28/2022   Stage 3a chronic kidney disease (HCC)    Fever, unspecified    Neck pain    Depressive reaction    Debility 01/17/2022   Malnutrition of moderate degree 01/12/2022   Large pleural effusion 01/11/2022   Acute kidney injury superimposed on chronic kidney disease (HCC) 01/11/2022   Dyslipidemia 01/11/2022   Mood disorder (HCC) 01/11/2022   DNR (do not resuscitate) 01/11/2022   Hemothorax on left 01/11/2022   Shoulder pain 08/29/2021   UTI (urinary tract infection) 08/26/2021   V-tach (HCC)    Seizure (HCC) 08/25/2021   Cardiomegaly NSVT 08/25/2021   BPH (benign prostatic hyperplasia) 08/25/2021   NSVT (nonsustained ventricular tachycardia) (HCC) 08/25/2021   Renal insufficiency 08/25/2021   Anxiety 08/25/2021   Chronic anticoagulation 08/25/2021   Acute encephalopathy 08/25/2021   History of CVA (cerebrovascular accident) 08/25/2021   Hypoxia 08/25/2021   Hyponatremia 03/24/2021   Greater trochanter fracture (HCC) 03/24/2021   Hypokalemia 03/24/2021   Hypomagnesemia 03/24/2021   Hypochloremia 03/24/2021   Aortic stenosis 09/05/2017   PICC (peripherally inserted central catheter) flush 02/19/2017   Benign essential HTN 02/10/2014   Atrial fibrillation (HCC)  GERD (gastroesophageal reflux disease)    Aftercare following surgery of the circulatory system, NEC 10/13/2012   Occlusion and stenosis of carotid artery without mention of cerebral infarction 09/03/2011   Aneurysm of abdominal vessel (HCC) 09/03/2011   Hemochromatosis 08/29/2011   PCP:  [name], MD Pharmacy:   [facility] [phone] - [place], [place] - [address] AT [facility] [address] [place] [address] Phone: [phone] Fax: [phone]  [facility] [address] [place] [address] Phone: [phone] Fax: [phone]  [id number] - [place], [place] - [address] [place] [address] Phone: [phone] Fax: [phone]  [id number] - [place], [place] - [address] [place] [address] Phone: [phone] Fax: [phone]     Social Determinants of Health (SDOH) Interventions    Readmission Risk Interventions     No data to display

## 2022-04-12 DIAGNOSIS — R4701 Aphasia: Secondary | ICD-10-CM | POA: Diagnosis not present

## 2022-04-12 DIAGNOSIS — S066XAA Traumatic subarachnoid hemorrhage with loss of consciousness status unknown, initial encounter: Secondary | ICD-10-CM | POA: Diagnosis not present

## 2022-04-12 DIAGNOSIS — S065XAA Traumatic subdural hemorrhage with loss of consciousness status unknown, initial encounter: Secondary | ICD-10-CM | POA: Diagnosis not present

## 2022-04-12 DIAGNOSIS — K219 Gastro-esophageal reflux disease without esophagitis: Secondary | ICD-10-CM

## 2022-04-12 DIAGNOSIS — R5381 Other malaise: Secondary | ICD-10-CM

## 2022-04-12 DIAGNOSIS — Z7901 Long term (current) use of anticoagulants: Secondary | ICD-10-CM

## 2022-04-12 LAB — BRAIN NATRIURETIC PEPTIDE: B Natriuretic Peptide: 600.6 pg/mL — ABNORMAL HIGH (ref 0.0–100.0)

## 2022-04-12 LAB — BASIC METABOLIC PANEL
Anion gap: 11 (ref 5–15)
BUN: 17 mg/dL (ref 8–23)
CO2: 25 mmol/L (ref 22–32)
Calcium: 8.9 mg/dL (ref 8.9–10.3)
Chloride: 96 mmol/L — ABNORMAL LOW (ref 98–111)
Creatinine, Ser: 1.13 mg/dL (ref 0.61–1.24)
GFR, Estimated: >60 mL/min (ref >60)
Glucose, Bld: 105 mg/dL — ABNORMAL HIGH (ref 70–99)
Potassium: 3.5 mmol/L (ref 3.5–5.1)
Sodium: 132 mmol/L — ABNORMAL LOW (ref 135–145)

## 2022-04-12 LAB — CBC
HCT: 31.8 % — ABNORMAL LOW (ref 39.0–52.0)
Hemoglobin: 10.8 g/dL — ABNORMAL LOW (ref 13.0–17.0)
MCH: 30.3 pg (ref 26.0–34.0)
MCHC: 34 g/dL (ref 30.0–36.0)
MCV: 89.3 fL (ref 80.0–100.0)
Platelets: 222 10*3/uL (ref 150–400)
RBC: 3.56 MIL/uL — ABNORMAL LOW (ref 4.22–5.81)
RDW: 14.5 % (ref 11.5–15.5)
WBC: 7.5 10*3/uL (ref 4.0–10.5)
nRBC: 0 % (ref 0.0–0.2)

## 2022-04-12 LAB — MAGNESIUM: Magnesium: 1.8 mg/dL (ref 1.7–2.4)

## 2022-04-12 MED ORDER — QUETIAPINE FUMARATE 25 MG PO TABS
12.5000 mg | ORAL_TABLET | ORAL | Status: DC
  Administered 2022-04-12 – 2022-04-14 (×3): 12.5 mg via ORAL
  Filled 2022-04-12 (×3): qty 1

## 2022-04-12 NOTE — Plan of Care (Signed)
  Problem: Self-Care: Goal: Ability to participate in self-care as condition permits will improve Outcome: Progressing   

## 2022-04-12 NOTE — Progress Notes (Addendum)
Speech Language Pathology Treatment: Dysphagia  Patient Details Name: Eugene [name] MRN: [id number] DOB: 06-11-1935 Today's Date: 04/12/2022 Time: 2426-8341 SLP Time Calculation (min) (ACUTE ONLY): 17 min  Assessment / Plan / Recommendation Clinical Impression   Pt seen for dysphagia tx with intake of min POs d/t pt refusal/decreased satiety.  Pt consumed thin via small sips and mechanical soft texture with min verbal cues for repetitive swallow and small bites during consumption, but pt's satiety impacting desire for POs and pt stating "it burns when it goes down" when eating cracker consistency.  Suspect esophageal component impacting pharyngoesophageal dysphagia paired with MBS results on 01/22/22 indicating retrograde into pyriform sinuses with need for "If pt exhibits s/sx concerning for airway invasion and demonstrates change in vital signs and medical status, with thin liquid intake, may downgrade to nectar-thick liquids."  Prior SLP also recommended esophageal/GI assessment f/u, but pt did not agree at this time.  Due to ongoing s/sx of esophageal dysphagia and decreased satiety/oral consumption, pt/family may reconsider assessing overall GI function and make any necessary changes to assist with dysphagic symptoms and/or repeat MBS to determine if swallowing has been further impacted since initial MBS in August of 2023.  Continue full liquid diet with esophageal/swallowing precautions in place during all PO intake and ST will continue to f/u in acute setting for education/diet tolerance and potential for objective study of swallowing.      HPI HPI: Patient is an 86 y.o. male with PMH: chronic a-fib, seizure disorder, HTN, HLD, chronic ambulation dysfunction with frequent falls, AAA s/p repair, recent UTI, CVA. He was admitted in August of 2023 following fall onto side of bathtub and ultimately was evaluated and treated by SLP for dysphagia (appeared to be acute on chronic). CT Head showed 1.8  cm hemorrhagic contusion of left temporal lobe as well as overlying SDH and SAH, mild mass effect on the underlying brain parenchyma but no midline shift. He failed Yale swallow with RN and was made NPO.  BSE completed on 04/10/22 and pt placed on a Dysphagia 2/thin diet and downgraded on 04/11/22 to a full liquid diet.  ST f/u for dysphagia tx/education.      SLP Plan  Continue with current plan of care;Other (Comment) (potential objective assessment)      Recommendations for follow up therapy are one component of a multi-disciplinary discharge planning process, led by the attending physician.  Recommendations may be updated based on patient status, additional functional criteria and insurance authorization.    Recommendations  Diet recommendations: Thin liquid;Dysphagia 2 (fine chop) (full) Liquids provided via: Straw;Cup Medication Administration:  (crushed in puree if large, whole in puree if small) Supervision: Patient able to self feed;Intermittent supervision to cue for compensatory strategies Compensations: Slow rate;Small sips/bites;Minimize environmental distractions;Multiple dry swallows after each bite/sip;Follow solids with liquid Postural Changes and/or Swallow Maneuvers: Seated upright 90 degrees;Upright 30-60 min after meal                Oral Care Recommendations: Oral care BID Follow Up Recommendations: Follow physician's recommendations for discharge plan and follow up therapies Assistance recommended at discharge: Frequent or constant Supervision/Assistance SLP Visit Diagnosis: Dysphagia, pharyngoesophageal phase (R13.14) Plan: Continue with current plan of care;Other (Comment) (potential objective assessment)           Eugene [name], M.S., CCC-SLP  04/12/2022, 11:43 AM

## 2022-04-12 NOTE — Progress Notes (Signed)
[facility]                                                                              Eugene [name], is a 86 y.o. male, DOB - [birth year], FIE:[id number] Admit date - 04/09/2022    Outpatient Primary MD for the patient is [name], MD  LOS - 3  days  No chief complaint on file.      Brief summary   86 y.o. male with medical history significant of chronic A-fib on Eliquis, seizure disorder on Keppra, hemochromatosis, HTN, HLD, chronic ambulation dysfunction with frequent falls, AAA s/p repair, recent UTI, came with fall and strokelike symptoms.  Patient was found to have subarachnoid hemorrhage and subdural hematoma.  He was hospitalized for further management.     Assessment & Plan    Principal Problem: Acute subarachnoid hemorrhage, subdural hematoma, traumatic (HCC) -Secondary to mechanical fall that he sustained a week prior to admission.  Patient presented with strokelike symptoms including expressive aphasia and left 7th nerve palsy. -On Eliquis for atrial fibrillation, currently on hold. -Neurosurgery was consulted.  CT head repeated showed no new changes -Seen by Dr. Yetta Barre, no acute neurosurgical interventions needed, likely okay to restart his anticoagulation in 7 to 10 days.  Will need follow-up head CT in 7 to 10 days if admitted to CIR otherwise follow-up in office 2 weeks after discharge. -PT eval recommended CIR -Continue Keppra (history of seizures) -Continue nimodipine 60 mg every 4 hours x 21 days for [facility]   Acute respiratory failure with hypoxia -Noted to have desaturation and hypoxia when he was given his meals and pills on 11/14, appears to have aspirated.  He was placed on 2 L O2 via [place] -SLP evaluation recommended full liquids, meds crushed in pure if large if small, whole in pure -Chest x-ray showed stable cardiomegaly with interstitial edema -Due to significant secretions, patient was placed on scopolamine patch   Chronic  oropharyngeal dysphagia -Patient was evaluated by SLP, recommended to continue for liquids   Chronic atrial fibrillation -Continue to hold Eliquis -Currently on metoprolol and nimodipine, BP soft -If BP low, will decrease metoprolol    Essential hypertension -BP currently stable, if running low, will decrease beta-blocker   History of seizure disorder Continue Keppra   Left-sided pleural effusion, acute on chronic Appears to be asymptomatic, continue to monitor for now   Obesity Estimated body mass index is 31.32 kg/m as calculated from the following:   Height as of 02/28/22: 5\' 5"  (1.651 m).   Weight as of 02/28/22: 85.4 kg.  Code Status: DNR DVT Prophylaxis:  SCD's Start: 04/09/22 1741   Level of Care: Level of care: Telemetry Medical Family Communication: Updated patient  Disposition Plan:      Remains inpatient appropriate: PT recommended CIR   Procedures:  None Consultants:   Neurosurgery Antimicrobials:   Anti-infectives (From admission, onward)    Start     Dose/Rate Route Frequency Ordered Stop   04/09/22 2200  cephALEXin (KEFLEX) capsule 500 mg        500 mg Oral 2 times daily 04/09/22 1742  Medications   stroke: early stages of recovery book   Does not apply Once   cephALEXin  500 mg Oral BID   docusate  100 mg Oral QODAY   famotidine  20 mg Oral BID   metoprolol tartrate  50 mg Oral BID   niMODipine  60 mg Oral Q4H   scopolamine  1 patch Transdermal Q72H   tamsulosin  0.4 mg Oral QHS      Subjective:   Eugene [name] was seen and examined today.  Denies any specific complaints, no seizure activity.  No chest pain, shortness of breath, headaches or blurry vision.  Wants to go home.    Objective:   Vitals:   04/11/22 2316 04/12/22 0400 04/12/22 0815 04/12/22 1057  BP: 107/66 102/68 (!) 105/59 (!) 97/56  Pulse: 74 82 98 88  Resp: 16  17 17   Temp: 98.8 F (37.1 C) 98.7 F (37.1 C) 98.1 F (36.7 C) 98.3 F (36.8 C)   TempSrc: Oral Oral Oral Oral  SpO2: 91% 95% 98%     Intake/Output Summary (Last 24 hours) at 04/12/2022 1405 Last data filed at 04/12/2022 04/14/2022 Gross per 24 hour  Intake 717 ml  Output --  Net 717 ml     Wt Readings from Last 3 Encounters:  02/28/22 85.4 kg  01/26/22 83 kg  01/16/22 85.5 kg     Exam General: Alert and oriented, NAD Cardiovascular: S1 S2 auscultated,  RRR Respiratory: Clear to auscultation bilaterally, no wheezing Gastrointestinal: Soft, nontender, nondistended, + bowel sounds Ext: no pedal edema bilaterally Neuro: subtle right-sided weakness    Data Reviewed:  I have personally reviewed following labs    CBC Lab Results  Component Value Date   WBC 7.5 04/12/2022   RBC 3.56 (L) 04/12/2022   HGB 10.8 (L) 04/12/2022   HCT 31.8 (L) 04/12/2022   MCV 89.3 04/12/2022   MCH 30.3 04/12/2022   PLT 222 04/12/2022   MCHC 34.0 04/12/2022   RDW 14.5 04/12/2022   LYMPHSABS 1.1 04/09/2022   MONOABS 0.7 04/09/2022   EOSABS 0.3 04/09/2022   BASOSABS 0.0 04/09/2022     Last metabolic panel Lab Results  Component Value Date   NA 132 (L) 04/12/2022   K 3.5 04/12/2022   CL 96 (L) 04/12/2022   CO2 25 04/12/2022   BUN 17 04/12/2022   CREATININE 1.13 04/12/2022   GLUCOSE 105 (H) 04/12/2022   GFRNONAA >60 04/12/2022   GFRAA >60 02/11/2020   CALCIUM 8.9 04/12/2022   PHOS 4.8 (H) 01/12/2022   PROT 5.9 (L) 04/11/2022   ALBUMIN 3.0 (L) 04/11/2022   BILITOT 2.2 (H) 04/11/2022   ALKPHOS 63 04/11/2022   AST 12 (L) 04/11/2022   ALT 10 04/11/2022   ANIONGAP 11 04/12/2022    CBG (last 3)  Recent Labs    04/09/22 1437  GLUCAP 94      Coagulation Profile: Recent Labs  Lab 04/09/22 1521  INR 1.6*     Radiology Studies: I have personally reviewed the imaging studies  DG CHEST PORT 1 VIEW  Result Date: 04/11/2022 CLINICAL DATA:  86 year old male with hypoxia. Recent intracranial hemorrhage. EXAM: PORTABLE CHEST 1 VIEW COMPARISON:  Portable  chest 04/09/2022 and earlier. FINDINGS: Portable AP semi upright view at 1004 hours. Extensive Calcified aortic atherosclerosis. Stable cardiomegaly and mediastinal contours. Stable lung volumes. Hypo ventilation at the left lung base persists and appears related to pleural effusion. Superimposed pulmonary vascularity/interstitial edema has mildly increased. No pneumothorax. No air bronchograms. No  acute osseous abnormality identified. IMPRESSION: 1. Stable cardiomegaly, interstitial edema, left pleural effusion. 2. No new cardiopulmonary abnormality. Aortic Atherosclerosis (ICD10-I70.0). Electronically Signed   By: Odessa Fleming M.D.   On: 04/11/2022 10:18   CT HEAD WO CONTRAST ( )  Result Date: 04/11/2022 CLINICAL DATA:  Subarachnoid hemorrhage [facility]) subdural hematoma, SAH EXAM: CT HEAD WITHOUT CONTRAST TECHNIQUE: Contiguous axial images were obtained from the base of the skull through the vertex without intravenous contrast. RADIATION DOSE REDUCTION: This exam was performed according to the departmental dose-optimization program which includes automated exposure control, adjustment of the mA and/or kV according to patient size and/or use of iterative reconstruction technique. COMPARISON:  CT head November 13, 23. FINDINGS: Brain: Unchanged hemorrhagic contusion in the anterior left temporal lobe with overlying extra-axial hemorrhage. Similar mild mass effect. Vascular: No hyperdense vessel identified. Skull: No acute fracture. Sinuses/Orbits: Clear sinuses.  No acute orbital findings. Other: No mastoid effusions. IMPRESSION: Unchanged hemorrhagic contusion in the anterior left temporal lobe with overlying extra-axial hemorrhage. Similar mild mass effect. No significant midline shift. Evidence of acute large vascular territory infarct, mass lesion, or hydrocephalus. Cerebral atrophy and ex vacuo ventricular dilation. Electronically Signed   By: Feliberto Harts M.D.   On: 04/11/2022 09:07       Jet Armbrust  M.D. [facility] 04/12/2022, 2:05 PM  Available via Epic secure chat 7am-7pm After 7 pm, please refer to night coverage provider listed on amion.

## 2022-04-12 NOTE — Care Management Important Message (Signed)
Important Message  Patient Details  Name: Eugene [name] MRN: [id number] Date of Birth: 08-19-1935   Medicare Important Message Given:  Yes     Dorena Bodo 04/12/2022, 2:31 PM

## 2022-04-12 NOTE — Plan of Care (Signed)
  Problem: Coping: Goal: Will verbalize positive feelings about self Outcome: Progressing Goal: Will identify appropriate support needs Outcome: Progressing   Problem: Nutrition: Goal: Adequate nutrition will be maintained Outcome: Progressing   Problem: Coping: Goal: Level of anxiety will decrease Outcome: Progressing

## 2022-04-12 NOTE — Progress Notes (Addendum)
Physical Therapy Treatment Patient Details Name: Eugene [name] MRN: [id number] DOB: 07/31/1935 Today's Date: 04/12/2022   History of Present Illness Eugene [name] is a 86 y.o. male with medical history significant of chronic A-fib on Eliquis, seizure disorder on Keppra, hemochromatosis, HTN, HLD, chronic ambulation dysfunction with frequent falls, AAA s/p repair, recent UTI, came with fall and strokelike symptoms.  MRI positive for 1.8 cm hemorrhagic contusion in the left anterior temporal lobe  as well as overlying subdural and subarachnoid hemorrhage. Mild mass  effect on the underlying brain parenchyma but no midline shift.    PT Comments    Pt with slow but steady progress towards acute goals. Pt long sitting in bed on arrival and able to come to sitting EOB with mod assist to maintain trunk elevation and scoot to EOB. Pt needing assist to maintain static sitting as pt with strong L lateral lean. Pt able to come to standing for x2 trials with RW and up to max assist to steady and maintain balance as pt with continued L lateral lean. Pt continues to be limited by weakness, fatigue, impaired balance/postural reactions and impaired communication. Pt oriented to person and situation, and attempting to make conversation at end of session about past, however with difficulty word finding. Pt continues to benefit from skilled PT services to progress toward functional mobility goals.    Recommendations for follow up therapy are one component of a multi-disciplinary discharge planning process, led by the attending physician.  Recommendations may be updated based on patient status, additional functional criteria and insurance authorization.  Follow Up Recommendations  Acute inpatient rehab (3hours/day)     Assistance Recommended at Discharge Frequent or constant Supervision/Assistance  Patient can return home with the following A lot of help with walking and/or transfers;Assist for  transportation;Help with stairs or ramp for entrance;A lot of help with bathing/dressing/bathroom   Equipment Recommendations  Hospital bed    Recommendations for Other Services Rehab consult     Precautions / Restrictions Precautions Precautions: Fall     Mobility  Bed Mobility Overal bed mobility: Needs Assistance Bed Mobility: Supine to Sit, Sit to Supine     Supine to sit: HOB elevated, Mod assist Sit to supine: Mod assist   General bed mobility comments: assist for legs off bed and to lift trunk, to supine with assist to lift legs, neeidng assist to find and maintain midline, strong L lateral lean    Transfers Overall transfer level: Needs assistance Equipment used: Rolling walker (2 wheels) Transfers: Sit to/from Stand Sit to Stand: Mod assist, Max assist           General transfer comment: mod assist to power up x2 to RW, max asssit to maintain standing balance as pt with strong L lateral lean    Ambulation/Gait               General Gait Details: pt requesting to lie back down   Stairs             Wheelchair Mobility    Modified Rankin (Stroke Patients Only)       Balance Overall balance assessment: Needs assistance Sitting-balance support: Feet supported Sitting balance-Leahy Scale: Poor Sitting balance - Comments: requring external assist to maintain sitting balance Postural control: Left lateral lean, Posterior lean Standing balance support: Bilateral upper extremity supported Standing balance-Leahy Scale: Zero  Cognition Arousal/Alertness: Awake/alert Behavior During Therapy: Flat affect Overall Cognitive Status: Difficult to assess Area of Impairment: Attention, Following commands                   Current Attention Level: Focused   Following Commands: Follows one step commands inconsistently, Follows one step commands with increased time       General Comments:  Inconistent answering of yes/no questions        Exercises      General Comments        Pertinent Vitals/Pain Pain Assessment Pain Assessment: Faces Faces Pain Scale: Hurts little more Pain Location: back Pain Descriptors / Indicators: Grimacing, Guarding Pain Intervention(s): Monitored during session, Limited activity within patient's tolerance    Home Living                          Prior Function            PT Goals (current goals can now be found in the care plan section) Acute Rehab PT Goals PT Goal Formulation: With patient/family Time For Goal Achievement: 04/25/22 Progress towards PT goals: Progressing toward goals    Frequency    Min 3X/week      PT Plan      Co-evaluation              AM-PAC PT "6 Clicks" Mobility   Outcome Measure  Help needed turning from your back to your side while in a flat bed without using bedrails?: A Lot Help needed moving from lying on your back to sitting on the side of a flat bed without using bedrails?: A Lot Help needed moving to and from a bed to a chair (including a wheelchair)?: Total Help needed standing up from a chair using your arms (e.g., wheelchair or bedside chair)?: Total Help needed to walk in hospital room?: Total Help needed climbing 3-5 steps with a railing? : Total 6 Click Score: 8    End of Session Equipment Utilized During Treatment: Gait belt Activity Tolerance: Patient limited by fatigue;Patient tolerated treatment well Patient left: in bed;with call bell/phone within reach;with bed alarm set Nurse Communication: Mobility status PT Visit Diagnosis: Muscle weakness (generalized) (M62.81);Other abnormalities of gait and mobility (R26.89);History of falling (Z91.81);Other symptoms and signs involving the nervous system (Y18.563)     Time: 1497-0263 PT Time Calculation (min) (ACUTE ONLY): 23 min  Charges:  $Therapeutic Activity: 23-37 mins                     Eliam Snapp R.  PTA Acute Rehabilitation Services Office: [phone]    Catalina Antigua 04/12/2022, 12:04 PM

## 2022-04-13 DIAGNOSIS — Z7901 Long term (current) use of anticoagulants: Secondary | ICD-10-CM | POA: Diagnosis not present

## 2022-04-13 DIAGNOSIS — R4701 Aphasia: Secondary | ICD-10-CM | POA: Diagnosis not present

## 2022-04-13 DIAGNOSIS — S066XAA Traumatic subarachnoid hemorrhage with loss of consciousness status unknown, initial encounter: Secondary | ICD-10-CM | POA: Diagnosis not present

## 2022-04-13 DIAGNOSIS — S065XAA Traumatic subdural hemorrhage with loss of consciousness status unknown, initial encounter: Secondary | ICD-10-CM | POA: Diagnosis not present

## 2022-04-13 MED ORDER — MELATONIN 5 MG PO TABS
5.0000 mg | ORAL_TABLET | Freq: Every day | ORAL | Status: DC
  Administered 2022-04-13 – 2022-04-15 (×3): 5 mg via ORAL
  Filled 2022-04-13 (×3): qty 1

## 2022-04-13 MED ORDER — ORAL CARE MOUTH RINSE: 15.0000 mL | OROMUCOSAL | Status: DC | PRN

## 2022-04-13 MED ORDER — ORAL CARE MOUTH RINSE
15.0000 mL | OROMUCOSAL | Status: DC
  Administered 2022-04-13 – 2022-04-16 (×10): 15 mL via OROMUCOSAL

## 2022-04-13 NOTE — TOC Progression Note (Signed)
Transition of Care [facility]) - Progression Note    Patient Details  Name: Eugene [name] MRN: [id number] Date of Birth: 06-05-1935  [facility]) CM/SW Contact  Kermit Balo, RN Phone Number: 04/13/2022, 1:09 PM  Clinical Narrative:    Awaiting CIR bed. TOC following.   Expected Discharge Plan: IP Rehab Facility Barriers to Discharge: Continued Medical Work up  Expected Discharge Plan and Services Expected Discharge Plan: IP Rehab Facility     Post Acute Care Choice: IP Rehab Living arrangements for the past 2 months: Single Family Home                                       Social Determinants of Health (SDOH) Interventions    Readmission Risk Interventions     No data to display

## 2022-04-13 NOTE — Progress Notes (Signed)
[facility]                                                                              Eugene [name], is a 86 y.o. male, DOB - 18-Aug-1935, XQJ:[id number] Admit date - 04/09/2022    Outpatient Primary MD for the patient is [name], MD  LOS - 4  days  No chief complaint on file.      Brief summary   86 y.o. male with medical history significant of chronic A-fib on Eliquis, seizure disorder on Keppra, hemochromatosis, HTN, HLD, chronic ambulation dysfunction with frequent falls, AAA s/p repair, recent UTI, came with fall and strokelike symptoms.  Patient was found to have subarachnoid hemorrhage and subdural hematoma.  He was hospitalized for further management.    Assessment & Plan    Principal Problem: Acute subarachnoid hemorrhage, subdural hematoma, traumatic (HCC) -Secondary to mechanical fall that he sustained a week prior to admission.  Patient presented with stroke like symptoms including expressive aphasia and left 7th nerve palsy. -On Eliquis for atrial fibrillation, currently on hold. -Neurosurgery was consulted.  CT head repeated showed no new changes -Seen by Dr. Yetta Barre, no acute neurosurgical interventions needed, likely okay to restart his anticoagulation in 7 to 10 days.  Will need follow-up head CT in 7 to 10 days if admitted to CIR otherwise follow-up in office 2 weeks after discharge. -PT eval recommended CIR -Continue Keppra (history of seizures) -Continue nimodipine 60 mg every 4 hours x 21 days for [facility]   Acute respiratory failure with hypoxia -Noted to have desaturation and hypoxia when he was given his meals and pills on 11/14, appears to have aspirated.  He was placed on 2 L O2 via [place] -SLP evaluation recommended full liquids, meds crushed in pure if large if small, whole in pure -Chest x-ray showed stable cardiomegaly with interstitial edema -Due to significant secretions, patient was placed on scopolamine patch -Currently  improving, O2 sats 99% on room air   Chronic oropharyngeal dysphagia -Patient was evaluated by SLP, diet advanced to regular diet   Chronic atrial fibrillation -Continue to hold Eliquis -Currently on metoprolol and nimodipine, BP soft  Essential hypertension -Currently on nimodipine and metoprolol -Monitor BP, if soft persistently, will decrease beta-blocker  History of seizure disorder Continue Keppra   Left-sided pleural effusion, acute on chronic Appears to be asymptomatic, continue to monitor for now  Insomnia with agitation, sundowning -Started on Seroquel 12.5 mg on 11/16, no significant effect, patient did not sleep at all, added melatonin 5 mg at night -If no improvement, will change to olanzapine tomorrow, discussed the plan with patient's daughter   Obesity Estimated body mass index is 31.32 kg/m as calculated from the following:   Height as of 02/28/22: 5\' 5"  (1.651 m).   Weight as of 02/28/22: 85.4 kg.  Code Status: DNR DVT Prophylaxis:  SCD's Start: 04/09/22 1741   Level of Care: Level of care: Telemetry Medical Family Communication: Updated patient's daughter on phone   Disposition Plan:      Remains inpatient appropriate: PT recommended CIR, bed availability pending.  DC to CIR when bed available   Procedures:  None Consultants:   Neurosurgery Antimicrobials:   Anti-infectives (From admission, onward)    Start     Dose/Rate Route Frequency Ordered Stop   04/09/22 2200  cephALEXin (KEFLEX) capsule 500 mg        500 mg Oral 2 times daily 04/09/22 1742            Medications   stroke: early stages of recovery book   Does not apply Once   cephALEXin  500 mg Oral BID   docusate  100 mg Oral QODAY   famotidine  20 mg Oral BID   metoprolol tartrate  50 mg Oral BID   niMODipine  60 mg Oral Q4H   QUEtiapine  12.5 mg Oral Q24H   scopolamine  1 patch Transdermal Q72H   tamsulosin  0.4 mg Oral QHS      Subjective:   Eugene [name] was seen  and examined today.  Overnight remained confused and agitated, called his daughter several times.  This morning appears to be exhausted and not feeling too well.  No seizure activity.   Objective:   Vitals:   04/12/22 2320 04/13/22 0309 04/13/22 0714 04/13/22 1119  BP: (!) 114/54 129/67 129/64 105/69  Pulse: 73 67 70 100  Resp: 18 18 18 18   Temp: 98.6 F (37 C) 98 F (36.7 C) 97.9 F (36.6 C) 97.9 F (36.6 C)  TempSrc: Axillary Axillary Oral Oral  SpO2: 92% 94% 96% 99%    Intake/Output Summary (Last 24 hours) at 04/13/2022 1332 Last data filed at 04/13/2022 0700 Gross per 24 hour  Intake --  Output 1150 ml  Net -1150 ml     Wt Readings from Last 3 Encounters:  02/28/22 85.4 kg  01/26/22 83 kg  01/16/22 85.5 kg   Physical Exam General: Alert and oriented, NAD, ill-appearing Cardiovascular: S1 S2 clear, RRR.  Respiratory: CTAB, no wheezing, rales or rhonchi Gastrointestinal: Soft, nontender, nondistended, NBS Ext: no pedal edema bilaterally Neuro: subtle right-sided weakness Psych: flat affect    Data Reviewed:  I have personally reviewed following labs    CBC Lab Results  Component Value Date   WBC 7.5 04/12/2022   RBC 3.56 (L) 04/12/2022   HGB 10.8 (L) 04/12/2022   HCT 31.8 (L) 04/12/2022   MCV 89.3 04/12/2022   MCH 30.3 04/12/2022   PLT 222 04/12/2022   MCHC 34.0 04/12/2022   RDW 14.5 04/12/2022   LYMPHSABS 1.1 04/09/2022   MONOABS 0.7 04/09/2022   EOSABS 0.3 04/09/2022   BASOSABS 0.0 04/09/2022     Last metabolic panel Lab Results  Component Value Date   NA 132 (L) 04/12/2022   K 3.5 04/12/2022   CL 96 (L) 04/12/2022   CO2 25 04/12/2022   BUN 17 04/12/2022   CREATININE 1.13 04/12/2022   GLUCOSE 105 (H) 04/12/2022   GFRNONAA >60 04/12/2022   GFRAA >60 02/11/2020   CALCIUM 8.9 04/12/2022   PHOS 4.8 (H) 01/12/2022   PROT 5.9 (L) 04/11/2022   ALBUMIN 3.0 (L) 04/11/2022   BILITOT 2.2 (H) 04/11/2022   ALKPHOS 63 04/11/2022   AST 12 (L)  04/11/2022   ALT 10 04/11/2022   ANIONGAP 11 04/12/2022    CBG (last 3)  No results for input(s): "GLUCAP" in the last 72 hours.     Coagulation Profile: Recent Labs  Lab 04/09/22 1521  INR 1.6*     Radiology Studies: I have personally reviewed the imaging studies  No results found.     Hai Grabe  M.D. [facility] 04/13/2022, 1:32 PM  Available via Epic secure chat 7am-7pm After 7 pm, please refer to night coverage provider listed on amion.

## 2022-04-13 NOTE — PMR Pre-admission (Signed)
PMR Admission Coordinator Pre-Admission Assessment  Patient: Eugene [name] is an 86 y.o., male MRN: [id number] DOB: 1935-07-24 Height:   Weight:    Insurance Information HMO:     PPO:      PCP:      IPA:      80/20: yes     OTHER:  PRIMARY: Medicare A & B     Policy#: [id number]      Subscriber: patient CM Name:       Phone#:      Fax#:  Pre-Cert#:       Employer:  Benefits:  Phone #: verified eligibility via [place] on 04/13/22     Name:  Eff. Date: Part A & B effective 06/28/02     Deduct: $1,600      Out of Pocket Max: NA      Life Max: NA CIR: 100% coverage      SNF: 100% coverage for days 1-20, 80% coverage for days 21-100 Outpatient: 80% coverage     Co-Pay: 20%  Home Health: 100% coverage      Co-Pay:  DME: 80% coverage     Co-Pay: 20% Providers: pt's choice SECONDARY: AARP    Policy#: [id number]     Phone#: [phone]  Financial Counselor:       Phone#:   The "Data Collection Information Summary" for patients in Inpatient Rehabilitation Facilities with attached "Privacy Act [place] Records" was provided and verbally reviewed with: {CHL IP Patient Family XB:[id number]}  Emergency Contact Information Contact Information     Name Relation Home Work Mobile   McDaniel,Stephanie Daughter   [phone]   Kenzel, Ruesch   [phone]       Current Medical History  Patient Admitting Diagnosis: SDH, SAH History of Present Illness: Pt is an 86 year old male with medical hx significant for: A-fib on Eliquis, GERD, hemochromatosis, NSVT, h/o CVA, recent h/o traumatic L hemothorax requiring chest tube, CKD stage III, CHF. Pt presented to [facility] on 04[facility]22 d/t fall the Wednesday prior to presentation and resulting aphasia. CT scan revealed hemorrhagic L temporal contusion with overlying SDH and SAH with mild mass effect and no midline shift. Neurosurgery did not recommend surgical intervention. EEG on 11/14 suggestive of cortical dysfunction  in left hemisphere. Also noted mild diffuse encephalopathy. No seizures seen. Pt had possible aspiration event on 11/14 when taking medication. Pt placed on 2-3L of O2 via nasal cannula. Pt also noted to have an increased amount of secretion. Scopolamine patch started. Follow up CT was stable. Therapy evaluations completed and CIR recommended d/t pt's deficits in functional mobility, inability to complete ADLs independently, and cognitive-linguistic deficits.*** Complete NIHSS TOTAL: 5  Patient's medical record from [facility] has been reviewed by the rehabilitation admission coordinator and physician.  Past Medical History  Past Medical History:  Diagnosis Date   AAA (abdominal aortic aneurysm) ([place]) 06/29/2007   stent graft   Aortic stenosis 09/05/2017   Mild AS/AR by echo 01/2017.   GERD (gastroesophageal reflux disease)    Hemochromatosis    Hyperlipidemia    Hypertension    Irritable bowel syndrome    Permanent atrial fibrillation (HCC)    Stroke (HCC)    Transient Ischemic Attack    Has the patient had major surgery during 100 days prior to admission? No  Family History   family history includes Heart disease in his father and mother; Hypertension in his mother.  Current Medications  Current Facility-Administered Medications:  stroke: early stages of recovery book, , Does not apply, Once, Lequita Halt, MD   acetaminophen (TYLENOL) tablet 650 mg, 650 mg, Oral, Q4H PRN **OR** acetaminophen (TYLENOL) 160 MG/5ML solution 650 mg, 650 mg, Per Tube, Q4H PRN, 650 mg at 04/12/22 1630 **OR** acetaminophen (TYLENOL) suppository 650 mg, 650 mg, Rectal, Q4H PRN, Wynetta Fines T, MD   alum & mag hydroxide-simeth (MAALOX/MYLANTA) 200-200-20 MG/5ML suspension 30 mL, 30 mL, Oral, Q4H PRN, Bonnielee Haff, MD   cephALEXin (KEFLEX) capsule 500 mg, 500 mg, Oral, BID, [place], Ping T, MD, 500 mg at 04/13/22 1771   diclofenac Sodium (VOLTAREN) 1 % topical gel 2 g, 2 g, Topical, QID PRN, Wynetta Fines T, MD, 2 g at 04/12/22 1652   docusate (COLACE) 50 MG/5ML liquid 100 mg, 100 mg, Oral, Nash Mantis, Nicki [place], MD, 100 mg at 04/13/22 0905   famotidine (PEPCID) tablet 20 mg, 20 mg, Oral, BID, Bonnielee Haff, MD, 20 mg at 04/13/22 1657   hydrALAZINE (APRESOLINE) injection 5 mg, 5 mg, Intravenous, Q6H PRN, Wynetta Fines T, MD, 5 mg at 04/10/22 1122   levETIRAcetam (KEPPRA) IVPB 500 mg/100 mL premix, 500 mg, Intravenous, Q12H, Godfrey Pick, MD, Last Rate: 400 mL/hr at 04/13/22 0033, 500 mg at 04/13/22 0033   metoprolol tartrate (LOPRESSOR) injection 2.5 mg, 2.5 mg, Intravenous, Q6H PRN, Bonnielee Haff, MD   metoprolol tartrate (LOPRESSOR) tablet 50 mg, 50 mg, Oral, BID, Bonnielee Haff, MD, 50 mg at 04/13/22 0906   niMODipine (NYMALIZE) 6 MG/ML oral solution 60 mg, 60 mg, Oral, Q4H, Bonnielee Haff, MD, 60 mg at 04/13/22 0906   polyethylene glycol (MIRALAX / GLYCOLAX) packet 17 g, 17 g, Oral, Daily PRN, Bonnielee Haff, MD   QUEtiapine (SEROQUEL) tablet 12.5 mg, 12.5 mg, Oral, Q24H, Rai, Ripudeep K, MD, 12.5 mg at 04/12/22 2006   scopolamine (TRANSDERM-SCOP) 1 MG/3DAYS 1.5 mg, 1 patch, Transdermal, Q72H, Bonnielee Haff, MD, 1.5 mg at 04/10/22 1338   tamsulosin (FLOMAX) capsule 0.4 mg, 0.4 mg, Oral, QHS, Bonnielee Haff, MD, 0.4 mg at 04/12/22 2006  Facility-Administered Medications Ordered in Other Encounters:    alteplase (CATHFLO ACTIVASE) injection 2 mg, 2 mg, Intracatheter, Once PRN, Magrinat, Virgie Dad, MD   sodium chloride 0.9 % injection 10 mL, 10 mL, Intravenous, PRN, Magrinat, Virgie Dad, MD, 10 mL at 11/25/14 1220  Patients Current Diet:  Diet Order             Diet regular Room service appropriate? Yes; Fluid consistency: Thin  Diet effective now                   Precautions / Restrictions Precautions Precautions: Fall   Has the patient had 2 or more falls or a fall with injury in the past year? Yes  Prior Activity Level Limited Community (1-2x/wk): gets out of house  for medical appointments  Prior Functional Level Self Care: Did the patient need help bathing, dressing, using the toilet or eating? Needed some help  Indoor Mobility: Did the patient need assistance with walking from room to room (with or without device)? Independent  Stairs: Did the patient need assistance with internal or external stairs (with or without device)? {Prior Functional XUXYB:[id number]}  Functional Cognition: Did the patient need help planning regular tasks such as shopping or remembering to take medications? Needed some help  Patient Information    Patient's Response To:     Home Assistive Devices / Equipment Home Equipment: Conservation officer, nature (2 wheels), Grab bars - tub/shower, Civil engineer, contracting -  built in, Geneticist, molecular  Prior Device Use: Indicate devices/aids used by the patient prior to current illness, exacerbation or injury? Manual wheelchair and Walker  Current Functional Level Cognition  Arousal/Alertness: Awake/alert Overall Cognitive Status: Difficult to assess Difficult to assess due to: Impaired communication Current Attention Level: Focused Orientation Level: Oriented to person, Oriented to place, Disoriented to time, Disoriented to situation Following Commands: Follows one step commands inconsistently, Follows one step commands with increased time General Comments: pt with continued difficult with word finding, improved from yesterday, answering questions nd following commands 75% of time    Extremity Assessment (includes Sensation/Coordination)  Upper Extremity Assessment: Generalized weakness, LUE deficits/detail LUE Deficits / Details: son reports L shoulder pain at baseline LUE: Unable to fully assess due to pain  Lower Extremity Assessment: Defer to PT evaluation    ADLs  Overall ADL's : Needs assistance/impaired Eating/Feeding: NPO Grooming: Moderate assistance, Sitting Upper Body Bathing: Moderate assistance, Maximal assistance, Sitting Lower Body  Bathing: Maximal assistance, Total assistance, Sitting/lateral leans, Sit to/from stand Upper Body Dressing : Moderate assistance, Sitting Lower Body Dressing: Maximal assistance, Total assistance, Sitting/lateral leans, Sit to/from stand Toilet Transfer Details (indicate cue type and reason): unable to progress into standing to date with OT Toileting- Clothing Manipulation and Hygiene: Total assistance, Bed level Toileting - Clothing Manipulation Details (indicate cue type and reason): rolling back and forth to complete peri-care    Mobility  Overal bed mobility: Needs Assistance Bed Mobility: Supine to Sit Supine to sit: Min assist, HOB elevated Sit to supine: Mod assist General bed mobility comments: assist for legs off bed and to elevate trunk, able to maintain sitting balance with min assist at start progressing to no assist, slight L later lean (daughter reporting baseline)    Transfers  Overall transfer level: Needs assistance Equipment used: Rolling walker (2 wheels) Transfers: Sit to/from Stand, Bed to chair/wheelchair/BSC Sit to Stand: Mod assist, Min assist Bed to/from chair/wheelchair/BSC transfer type:: Step pivot Step pivot transfers: Min assist General transfer comment: mod assist to power and steady on first trial, min assist x2 more stands,contnued left lateral lean with pt able to maintain standing balance with min assist and step pivot to recliner with assist to manage RW and lines    Ambulation / Gait / Stairs / Wheelchair Mobility  Ambulation/Gait General Gait Details: pt declining Pre-gait activities: weight shifting R/L in standing    Posture / Balance Dynamic Sitting Balance Sitting balance - Comments: requring external assist to maintain sitting balance Balance Overall balance assessment: Needs assistance Sitting-balance support: Feet supported Sitting balance-Leahy Scale: Poor Sitting balance - Comments: requring external assist to maintain sitting  balance Postural control: Left lateral lean, Posterior lean Standing balance support: Bilateral upper extremity supported Standing balance-Leahy Scale: Poor Standing balance comment: heavy reliance on RW    Special needs/care consideration Skin Pressure injury: coccyx/medial, Bowel and Bladder incontinence   Previous Home Environment (from acute therapy documentation) Living Arrangements: Non-relatives/Friends (has 24/7 caregivers) Available Help at Discharge: Family, Personal care attendant, Available 24 hours/day Type of Home: House Home Layout: One level Home Access: Level entry Bathroom Shower/Tub: Multimedia programmer: Handicapped height Bathroom Accessibility: Yes How Accessible: Accessible via wheelchair, Accessible via walker Home Care Services: Yes Type of Home Care Services: [facility] (if known): [facility] Additional Comments: lift chair  Discharge Living Setting Plans for Discharge Living Setting: Patient's home Type of Home at Discharge: House Discharge Home Layout: One level Discharge Home Access: Level entry Discharge  Bathroom Shower/Tub: Horticulturist, commercial: Handicapped height Discharge Bathroom Accessibility: Yes How Accessible: Accessible via wheelchair, Accessible via walker Does the patient have any problems obtaining your medications?: No  Social/Family/Support Systems Anticipated Caregiver: family and hired caregivers Anticipated Caregiver's Contact Information: Dario Guardian, daughter: [phone] Caregiver Availability: 24/7 Discharge Plan Discussed with Primary Caregiver: Yes Is Caregiver In Agreement with Plan?: Yes Does Caregiver/Family have Issues with Lodging/Transportation while Pt is in Rehab?: No  Goals Patient/Family Goal for Rehab: *** Expected length of stay: *** Pt/Family Agrees to Admission and willing to participate: Yes Program Orientation Provided & Reviewed with Pt/Caregiver  Including Roles  & Responsibilities: Yes  Decrease burden of Care through IP rehab admission: NA  Possible need for SNF placement upon discharge: Not anticipated  Patient Condition: I have reviewed medical records from [facility], spoken with {CHL IP CSW JO:[id number]}, and patient and daughter. I met with patient at the bedside for inpatient rehabilitation assessment.  Patient will benefit from ongoing PT, OT, and SLP, can actively participate in 3 hours of therapy a day 5 days of the week, and can make measurable gains during the admission.  Patient will also benefit from the coordinated team approach during an Inpatient Acute Rehabilitation admission.  The patient will receive intensive therapy as well as Rehabilitation physician, nursing, social worker, and care management interventions.  Due to bladder management, bowel management, safety, skin/wound care, disease management, medication administration, pain management, and patient education the patient requires 24 hour a day rehabilitation nursing.  The patient is currently *** with mobility and basic ADLs.  Discharge setting and therapy post discharge at home with home health is anticipated.  Patient has agreed to participate in the Acute Inpatient Rehabilitation Program and will admit {Time; today/tomorrow:10263}.  Preadmission Screen Completed By:  Bethel Born, 04/13/2022 11:56 AM ______________________________________________________________________   Discussed status with Dr. Marland Kitchen on *** at *** and received approval for admission today.  Admission Coordinator:  Bethel Born, CCC-SLP, time ***/Date ***   Assessment/Plan: Diagnosis: Does the need for close, 24 hr/day Medical supervision in concert with the patient's rehab needs make it unreasonable for this patient to be served in a less intensive setting? {yes_no_potentially:3041433} Co-Morbidities requiring supervision/potential complications: *** Due to {due  EB:5830940}, does the patient require 24 hr/day rehab nursing? {yes_no_potentially:3041433} Does the patient require coordinated care of a physician, rehab nurse, PT, OT, and SLP to address physical and functional deficits in the context of the above medical diagnosis(es)? {yes_no_potentially:3041433} Addressing deficits in the following areas: {deficits:3041436} Can the patient actively participate in an intensive therapy program of at least 3 hrs of therapy 5 days a week? {yes_no_potentially:3041433} The potential for patient to make measurable gains while on inpatient rehab is {potential:3041437} Anticipated functional outcomes upon discharge from inpatient rehab: {functional outcomes:304600100} PT, {functional outcomes:304600100} OT, {functional outcomes:304600100} SLP Estimated rehab length of stay to reach the above functional goals is: *** Anticipated discharge destination: {anticipated dc setting:21604} 10. Overall Rehab/Functional Prognosis: {potential:3041437}   MD Signature: ***

## 2022-04-13 NOTE — Progress Notes (Signed)
Physical Therapy Treatment Patient Details Name: Eugene [name] MRN: [id number] DOB: 01-17-1936 Today's Date: 04/13/2022   History of Present Illness Eugene [name] is a 86 y.o. male with medical history significant of chronic A-fib on Eliquis, seizure disorder on Keppra, hemochromatosis, HTN, HLD, chronic ambulation dysfunction with frequent falls, AAA s/p repair, recent UTI, came with fall and strokelike symptoms.  MRI positive for 1.8 cm hemorrhagic contusion in the left anterior temporal lobe  as well as overlying subdural and subarachnoid hemorrhage. Mild mass  effect on the underlying brain parenchyma but no midline shift.    PT Comments    Pt with continued progress towards acute goals. Pt requiring min assist for bed mobility and demonstrating improved sitting balance this session with ability to maintain static sitting without assist. Pt with noted L lateral lean in sitting ans standing, with daughter reporting slight lean at baseline. Pt able to weight shift R/L in standing to correct however unable to maintain midline in standing without assist. Pt able to transfer to standing in RW and step pivot to recliner with min assist to steady and manage RW throughout. Current plan remains appropriate to address deficits and maximize functional independence and decrease caregiver burden. Pt continues to benefit from skilled PT services to progress toward functional mobility goals.    Recommendations for follow up therapy are one component of a multi-disciplinary discharge planning process, led by the attending physician.  Recommendations may be updated based on patient status, additional functional criteria and insurance authorization.  Follow Up Recommendations  Acute inpatient rehab (3hours/day)     Assistance Recommended at Discharge Frequent or constant Supervision/Assistance  Patient can return home with the following A lot of help with walking and/or transfers;Assist for  transportation;Help with stairs or ramp for entrance;A lot of help with bathing/dressing/bathroom   Equipment Recommendations  Hospital bed    Recommendations for Other Services       Precautions / Restrictions Precautions Precautions: Fall     Mobility  Bed Mobility Overal bed mobility: Needs Assistance Bed Mobility: Supine to Sit     Supine to sit: Min assist, HOB elevated     General bed mobility comments: assist for legs off bed and to elevate trunk, able to maintain sitting balance with min assist at start progressing to no assist, slight L later lean (daughter reporting baseline)    Transfers Overall transfer level: Needs assistance Equipment used: Rolling walker (2 wheels) Transfers: Sit to/from Stand, Bed to chair/wheelchair/BSC Sit to Stand: Mod assist, Min assist   Step pivot transfers: Min assist       General transfer comment: mod assist to power and steady on first trial, min assist x2 more stands,contnued left lateral lean with pt able to maintain standing balance with min assist and step pivot to recliner with assist to manage RW and lines    Ambulation/Gait             Pre-gait activities: weight shifting R/L in standing General Gait Details: pt declining   Stairs             Wheelchair Mobility    Modified Rankin (Stroke Patients Only)       Balance Overall balance assessment: Needs assistance Sitting-balance support: Feet supported Sitting balance-Leahy Scale: Poor Sitting balance - Comments: requring external assist to maintain sitting balance Postural control: Left lateral lean, Posterior lean Standing balance support: Bilateral upper extremity supported Standing balance-Leahy Scale: Poor Standing balance comment: heavy reliance on RW  Cognition Arousal/Alertness: Awake/alert Behavior During Therapy: Flat affect Overall Cognitive Status: Difficult to assess Area of Impairment:  Attention, Following commands                   Current Attention Level: Focused   Following Commands: Follows one step commands inconsistently, Follows one step commands with increased time       General Comments: pt with continued difficult with word finding, improved from yesterday, answering questions nd following commands 75% of time        Exercises General Exercises - Lower Extremity Long Arc Quad: AROM, Right, Left, 20 reps, Seated Hip Flexion/Marching: AROM, Right, Left, 20 reps, Seated    General Comments        Pertinent Vitals/Pain Pain Assessment Pain Assessment: Faces Faces Pain Scale: Hurts little more Pain Location: back, L shoulder Pain Descriptors / Indicators: Grimacing, Guarding Pain Intervention(s): Monitored during session, Limited activity within patient's tolerance, Repositioned    Home Living   Living Arrangements: Non-relatives/Friends (has 24/7 caregivers) Available Help at Discharge: Family;Personal care attendant;Available 24 hours/day Type of Home: House Home Access: Level entry       Home Layout: One level        Prior Function            PT Goals (current goals can now be found in the care plan section) Acute Rehab PT Goals PT Goal Formulation: With patient/family Time For Goal Achievement: 04/25/22    Frequency    Min 3X/week      PT Plan      Co-evaluation              AM-PAC PT "6 Clicks" Mobility   Outcome Measure  Help needed turning from your back to your side while in a flat bed without using bedrails?: A Lot Help needed moving from lying on your back to sitting on the side of a flat bed without using bedrails?: A Lot Help needed moving to and from a bed to a chair (including a wheelchair)?: A Lot Help needed standing up from a chair using your arms (e.g., wheelchair or bedside chair)?: A Lot Help needed to walk in hospital room?: A Lot Help needed climbing 3-5 steps with a railing? : Total 6  Click Score: 11    End of Session Equipment Utilized During Treatment: Gait belt Activity Tolerance: Patient tolerated treatment well Patient left: with call bell/phone within reach;in chair;with family/visitor present Nurse Communication: Mobility status PT Visit Diagnosis: Muscle weakness (generalized) (M62.81);Other abnormalities of gait and mobility (R26.89);History of falling (Z91.81);Other symptoms and signs involving the nervous system (W29.562)     Time: 1308-6578 PT Time Calculation (min) (ACUTE ONLY): 33 min  Charges:  $Gait Training: 8-22 mins $Therapeutic Activity: 8-22 mins                     Lasheba Stevens R. PTA Acute Rehabilitation Services Office: [phone]    Catalina Antigua 04/13/2022, 11:56 AM

## 2022-04-13 NOTE — Progress Notes (Signed)
Speech Language Pathology Treatment: Dysphagia;Cognitive-Linquistic  Patient Details Name: Eugene [name] MRN: [id number] DOB: [birth year] Today's Date: 04/13/2022 Time: 4132-4401 SLP Time Calculation (min) (ACUTE ONLY): 35 min  Assessment / Plan / Recommendation Clinical Impression  Pt seen for potential for diet upgrade vs need for repeat instrumental testing. Pt has been tolerating thin liquid diet, but intake has been minimal . He is somewhat particular. Also has a history of mild baseline dysphagia with ultimately appears unchanged. Pt endorses some globus/GER symptoms as times and today when following solids with liquids pt had one wet belch and second swallow. There are no acute changes to swallowing mechanism that can be observed. Pt needs quality nutrition to progress through therapy at this time. Recommend pt resume unrestricted diet so that he can choose favorite foods, while following basic precautions such as upright posture, following bites with sips and cautions intake. Will f/u for tolerance.  Pt also making progress with language goals. Today he was able to express basic wants and needs with moderate clarification and choice cues. Pt able to name family member with errorless learning methods; progressed from sound cue to model in 100% of attempts while looking a photos. Pt is an excellent AIR candidate as he is willing to participate and making functional gains. Will f/u.   HPI HPI: Patient is an 86 y.o. male with PMH: chronic a-fib, seizure disorder, HTN, HLD, chronic ambulation dysfunction with frequent falls, AAA s/p repair, recent UTI, CVA. He was admitted in August of 2023 following fall onto side of bathtub and ultimately was evaluated and treated by SLP for dysphagia (appeared to be acute on chronic). CT Head showed 1.8 cm hemorrhagic contusion of left temporal lobe as well as overlying SDH and SAH, mild mass effect on the underlying brain parenchyma but no midline shift. He  failed Yale swallow with RN and was made NPO.  BSE completed on 04/10/22 and pt placed on a Dysphagia 2/thin diet and downgraded on 04/11/22 to a full liquid diet.  ST f/u for dysphagia tx/education.      SLP Plan  Continue with current plan of care;Other (Comment)      Recommendations for follow up therapy are one component of a multi-disciplinary discharge planning process, led by the attending physician.  Recommendations may be updated based on patient status, additional functional criteria and insurance authorization.    Recommendations  Diet recommendations: Thin liquid;Regular Liquids provided via: Straw;Cup Supervision: Full supervision/cueing for compensatory strategies Compensations: Slow rate;Small sips/bites;Minimize environmental distractions;Multiple dry swallows after each bite/sip;Follow solids with liquid Postural Changes and/or Swallow Maneuvers: Seated upright 90 degrees;Upright 30-60 min after meal                Oral Care Recommendations: Oral care BID Follow Up Recommendations: Follow physician's recommendations for discharge plan and follow up therapies Assistance recommended at discharge: Frequent or constant Supervision/Assistance SLP Visit Diagnosis: Dysphagia, pharyngoesophageal phase (R13.14) Plan: Continue with current plan of care;Other (Comment)           Zeynep Fantroy, Riley Nearing  04/13/2022, 10:53 AM

## 2022-04-13 NOTE — Progress Notes (Signed)
Inpatient Rehab Admissions:  Inpatient Rehab Consult received.  I met with pt and pt's daughter Colletta Maryland at the bedside for rehabilitation assessment and to discuss goals and expectations of an inpatient rehab admission.  Discussed average length of stay, discharge home after completion of CIR. Both acknowledged understanding. Pt interested in pursuing CIR and daughter supportive. Daughter and pt confirmed that pt has 24/7 caregivers that will be able to provide support for pt after discharge. Will continue to follow.  Signed: Gayland Curry, [place], [place] Admissions Coordinator [phone]

## 2022-04-14 ENCOUNTER — Other Ambulatory Visit: Payer: Self-pay

## 2022-04-14 DIAGNOSIS — Z7901 Long term (current) use of anticoagulants: Secondary | ICD-10-CM | POA: Diagnosis not present

## 2022-04-14 DIAGNOSIS — S065XAA Traumatic subdural hemorrhage with loss of consciousness status unknown, initial encounter: Secondary | ICD-10-CM | POA: Diagnosis not present

## 2022-04-14 DIAGNOSIS — R4701 Aphasia: Secondary | ICD-10-CM | POA: Diagnosis not present

## 2022-04-14 DIAGNOSIS — S066XAA Traumatic subarachnoid hemorrhage with loss of consciousness status unknown, initial encounter: Secondary | ICD-10-CM | POA: Diagnosis not present

## 2022-04-14 NOTE — Progress Notes (Signed)
[facility]                                                                              [name], is a 86 y.o. male, DOB - [birth year], VHQ:[id number] Admit date - 04/09/2022    Outpatient Primary MD for the patient is [name], MD  LOS - 5  days  No chief complaint on file.      Brief summary   86 y.o. male with medical history significant of chronic A-fib on Eliquis, seizure disorder on Keppra, hemochromatosis, HTN, HLD, chronic ambulation dysfunction with frequent falls, AAA s/p repair, recent UTI, came with fall and strokelike symptoms.  Patient was found to have subarachnoid hemorrhage and subdural hematoma.  He was hospitalized for further management.    Assessment & Plan    Principal Problem: Acute subarachnoid hemorrhage, subdural hematoma, traumatic (HCC) -Secondary to mechanical fall that he sustained a week prior to admission.  Patient presented with stroke like symptoms including expressive aphasia and left 7th nerve palsy. -On Eliquis for atrial fibrillation, currently on hold. -Neurosurgery was consulted.  CT head repeated showed no new changes -Seen by Dr. Yetta Barre, no acute neurosurgical interventions needed, likely okay to restart his anticoagulation in 7 to 10 days.  Will need follow-up head CT in 7 to 10 days if admitted to CIR otherwise follow-up in office 2 weeks after discharge. -Continue Keppra (history of seizures) -Continue nimodipine 60 mg every 4 hours x 21 days for [facility] -PT evaluation recommended CIR, currently awaiting bed   Acute respiratory failure with hypoxia -Noted to have desaturation and hypoxia when he was given his meals and pills on 11/14, appears to have aspirated.  He was placed on 2 L O2 via [place] -SLP evaluation completed.  Currently tolerating regular diet -Chest x-ray showed stable cardiomegaly with interstitial edema -Due to significant secretions, patient was placed on scopolamine patch -Improving, O2 sats 93  to 94% on room air   Chronic oropharyngeal dysphagia -Patient was evaluated by SLP, diet advanced to regular diet   Chronic atrial fibrillation -Continue to hold Eliquis -Currently on metoprolol and nimodipine, BP soft  Essential hypertension -Currently on nimodipine and metoprolol -Monitor BP, if soft persistently, will decrease beta-blocker  History of seizure disorder Continue Keppra   Left-sided pleural effusion, acute on chronic Appears to be asymptomatic, continue to monitor for now  Insomnia with agitation, sundowning -Continue Seroquel 12.5 mg daily and melatonin 5 mg daily at bedtime for sleep  -If no improvement, will change to olanzapine   Obesity Estimated body mass index is 31.32 kg/m as calculated from the following:   Height as of 02/28/22: 5\' 5"  (1.651 m).   Weight as of 02/28/22: 85.4 kg.  Code Status: DNR DVT Prophylaxis:  SCD's Start: 04/09/22 1741   Level of Care: Level of care: Telemetry Medical Family Communication: Updated patient's daughter on phone on 11/17  Disposition Plan:      Remains inpatient appropriate: PT recommended CIR, bed availability pending.  DC to CIR when bed available   Procedures:  None Consultants:   Neurosurgery Antimicrobials:   Anti-infectives (From admission, onward)    Start  Dose/Rate Route Frequency Ordered Stop   04/09/22 2200  cephALEXin (KEFLEX) capsule 500 mg        500 mg Oral 2 times daily 04/09/22 1742            Medications   stroke: early stages of recovery book   Does not apply Once   cephALEXin  500 mg Oral BID   docusate  100 mg Oral QODAY   famotidine  20 mg Oral BID   melatonin  5 mg Oral QHS   metoprolol tartrate  50 mg Oral BID   niMODipine  60 mg Oral Q4H   mouth rinse  15 mL Mouth Rinse 4 times per day   QUEtiapine  12.5 mg Oral Q24H   scopolamine  1 patch Transdermal Q72H   tamsulosin  0.4 mg Oral QHS      Subjective:   Eugene [name] was seen and examined today.  Alert  and awake, oriented, no acute complaints at this time.  No seizure activity.  Objective:   Vitals:   04/13/22 2347 04/14/22 0119 04/14/22 0531 04/14/22 0746  BP: (!) 97/57 115/65 (!) 116/56 111/60  Pulse: 67  68 67  Resp: 17  16 17   Temp: 98.7 F (37.1 C)  98.6 F (37 C) 98.4 F (36.9 C)  TempSrc: Oral  Oral Oral  SpO2: 91%  94% 93%    Intake/Output Summary (Last 24 hours) at 04/14/2022 1051 Last data filed at 04/14/2022 0700 Gross per 24 hour  Intake 100 ml  Output 950 ml  Net -850 ml     Wt Readings from Last 3 Encounters:  02/28/22 85.4 kg  01/26/22 83 kg  01/16/22 85.5 kg    Physical Exam General: Alert and oriented x 3, NAD Cardiovascular: S1 S2 clear, RRR.  Respiratory: CTAB, no wheezing Gastrointestinal: Soft, nontender, nondistended, NBS Ext: no pedal edema bilaterally Neuro: no new deficits, subtle right-sided weakness Psych: flat affect   Data Reviewed:  I have personally reviewed following labs    CBC Lab Results  Component Value Date   WBC 7.5 04/12/2022   RBC 3.56 (L) 04/12/2022   HGB 10.8 (L) 04/12/2022   HCT 31.8 (L) 04/12/2022   MCV 89.3 04/12/2022   MCH 30.3 04/12/2022   PLT 222 04/12/2022   MCHC 34.0 04/12/2022   RDW 14.5 04/12/2022   LYMPHSABS 1.1 04/09/2022   MONOABS 0.7 04/09/2022   EOSABS 0.3 04/09/2022   BASOSABS 0.0 04/09/2022     Last metabolic panel Lab Results  Component Value Date   NA 132 (L) 04/12/2022   K 3.5 04/12/2022   CL 96 (L) 04/12/2022   CO2 25 04/12/2022   BUN 17 04/12/2022   CREATININE 1.13 04/12/2022   GLUCOSE 105 (H) 04/12/2022   GFRNONAA >60 04/12/2022   GFRAA >60 02/11/2020   CALCIUM 8.9 04/12/2022   PHOS 4.8 (H) 01/12/2022   PROT 5.9 (L) 04/11/2022   ALBUMIN 3.0 (L) 04/11/2022   BILITOT 2.2 (H) 04/11/2022   ALKPHOS 63 04/11/2022   AST 12 (L) 04/11/2022   ALT 10 04/11/2022   ANIONGAP 11 04/12/2022    CBG (last 3)  No results for input(s): "GLUCAP" in the last 72 hours.      Coagulation Profile: Recent Labs  Lab 04/09/22 1521  INR 1.6*     Radiology Studies: I have personally reviewed the imaging studies  No results found.     04/11/22 M.D. [facility] 04/14/2022, 10:51 AM  Available via Epic secure chat  7am-7pm After 7 pm, please refer to night coverage provider listed on amion.

## 2022-04-15 DIAGNOSIS — R4701 Aphasia: Secondary | ICD-10-CM | POA: Diagnosis not present

## 2022-04-15 DIAGNOSIS — S065XAA Traumatic subdural hemorrhage with loss of consciousness status unknown, initial encounter: Secondary | ICD-10-CM | POA: Diagnosis not present

## 2022-04-15 DIAGNOSIS — R5381 Other malaise: Secondary | ICD-10-CM | POA: Diagnosis not present

## 2022-04-15 DIAGNOSIS — Z7901 Long term (current) use of anticoagulants: Secondary | ICD-10-CM | POA: Diagnosis not present

## 2022-04-15 MED ORDER — OLANZAPINE 2.5 MG PO TABS
2.5000 mg | ORAL_TABLET | Freq: Every day | ORAL | Status: DC
  Administered 2022-04-15: 2.5 mg via ORAL
  Filled 2022-04-15: qty 1

## 2022-04-15 NOTE — Progress Notes (Signed)
[facility]                                                                              [name], is a 86 y.o. male, DOB - 07-Jul-1935, [id number] Admit date - 04/09/2022    Outpatient Primary MD for the patient is [name], MD  LOS - 6  days  No chief complaint on file.      Brief summary   86 y.o. male with medical history significant of chronic A-fib on Eliquis, seizure disorder on Keppra, hemochromatosis, HTN, HLD, chronic ambulation dysfunction with frequent falls, AAA s/p repair, recent UTI, came with fall and strokelike symptoms.  Patient was found to have subarachnoid hemorrhage and subdural hematoma.  He was hospitalized for further management.    Assessment & Plan    Principal Problem: Acute subarachnoid hemorrhage, subdural hematoma, traumatic (HCC) -Secondary to mechanical fall that he sustained a week prior to admission.  Patient presented with stroke like symptoms including expressive aphasia and left 7th nerve palsy. -On Eliquis for atrial fibrillation, currently on hold. -Neurosurgery was consulted.  CT head repeated showed no new changes -Seen by Dr. Yetta Barre, no acute neurosurgical interventions needed, likely okay to restart his anticoagulation in 7 to 10 days.  Will need follow-up head CT in 7 to 10 days if admitted to CIR otherwise follow-up in office 2 weeks after discharge. -Continue Keppra (history of seizures) -Continue nimodipine 60 mg every 4 hours x 21 days for [facility] -PT evaluation recommended CIR, currently awaiting bed   Acute metabolic encephalopathy, appears to have underlying dementia with cognitive deficits, sundowning -UA negative for UTI.  Likely cognitive deficits worsened due to acute SAH, SDH -Patient was placed on Seroquel with no significant effect, will change to olanzapine 2.5 mg at bedtime, continue melatonin  Acute respiratory failure with hypoxia -Noted to have desaturation and hypoxia when he was given  his meals and pills on 11/14, appears to have aspirated.  He was placed on 2 L O2 via [place] -SLP evaluation completed, continue regular diet -Chest x-ray showed stable cardiomegaly with interstitial edema -Due to significant secretions, patient was placed on scopolamine patch -Stable, O2 sats 94% on room air   Chronic oropharyngeal dysphagia -Patient was evaluated by SLP, diet advanced to regular diet   Chronic atrial fibrillation -Continue to hold Eliquis -Currently on metoprolol and nimodipine, BP soft  Essential hypertension -Currently on nimodipine and metoprolol -BP stable  History of seizure disorder Continue Keppra   Left-sided pleural effusion, acute on chronic Appears to be asymptomatic, continue to monitor for now   Obesity Estimated body mass index is 31.32 kg/m as calculated from the following:   Height as of 02/28/22: 5\' 5"  (1.651 m).   Weight as of 02/28/22: 85.4 kg.  Code Status: DNR DVT Prophylaxis:  SCD's Start: 04/09/22 1741   Level of Care: Level of care: Telemetry Medical Family Communication: Updated patient's daughter on phone on 11/17  Disposition Plan:      Remains inpatient appropriate: PT recommended CIR, bed availability pending.  DC to CIR when bed available   Procedures:  None Consultants:   Neurosurgery Antimicrobials:  Anti-infectives (From admission, onward)    Start     Dose/Rate Route Frequency Ordered Stop   04/09/22 2200  cephALEXin (KEFLEX) capsule 500 mg        500 mg Oral 2 times daily 04/09/22 1742            Medications   stroke: early stages of recovery book   Does not apply Once   cephALEXin  500 mg Oral BID   docusate  100 mg Oral QODAY   famotidine  20 mg Oral BID   melatonin  5 mg Oral QHS   metoprolol tartrate  50 mg Oral BID   niMODipine  60 mg Oral Q4H   OLANZapine  2.5 mg Oral QHS   mouth rinse  15 mL Mouth Rinse 4 times per day   scopolamine  1 patch Transdermal Q72H   tamsulosin  0.4 mg Oral QHS       Subjective:   Eugene [name] was seen and examined today.  Overnight was agitated with sundowning.  No acute issues today morning.  Alert and awake, watching TV  Objective:   Vitals:   04/14/22 2346 04/15/22 0346 04/15/22 0746 04/15/22 1146  BP: 122/70 133/77 124/78 122/67  Pulse: 83 71 88 82  Resp: 18 14 16 15   Temp: 97.6 F (36.4 C) 97.8 F (36.6 C) 99.5 F (37.5 C) 99.3 F (37.4 C)  TempSrc: Oral Oral Axillary Oral  SpO2: 92% 93% 93% 94%    Intake/Output Summary (Last 24 hours) at 04/15/2022 1237 Last data filed at 04/15/2022 0700 Gross per 24 hour  Intake 500 ml  Output 1675 ml  Net -1175 ml     Wt Readings from Last 3 Encounters:  02/28/22 85.4 kg  01/26/22 83 kg  01/16/22 85.5 kg   Physical Exam General: Alert and oriented x 3, NAD Cardiovascular: S1 S2 clear, RRR.  Respiratory: CTAB Gastrointestinal: Soft, nontender, nondistended, NBS Ext: no pedal edema bilaterally Neuro: no new deficits Psych: Normal affect    Data Reviewed:  I have personally reviewed following labs    CBC Lab Results  Component Value Date   WBC 7.5 04/12/2022   RBC 3.56 (L) 04/12/2022   HGB 10.8 (L) 04/12/2022   HCT 31.8 (L) 04/12/2022   MCV 89.3 04/12/2022   MCH 30.3 04/12/2022   PLT 222 04/12/2022   MCHC 34.0 04/12/2022   RDW 14.5 04/12/2022   LYMPHSABS 1.1 04/09/2022   MONOABS 0.7 04/09/2022   EOSABS 0.3 04/09/2022   BASOSABS 0.0 04/09/2022     Last metabolic panel Lab Results  Component Value Date   NA 132 (L) 04/12/2022   K 3.5 04/12/2022   CL 96 (L) 04/12/2022   CO2 25 04/12/2022   BUN 17 04/12/2022   CREATININE 1.13 04/12/2022   GLUCOSE 105 (H) 04/12/2022   GFRNONAA >60 04/12/2022   GFRAA >60 02/11/2020   CALCIUM 8.9 04/12/2022   PHOS 4.8 (H) 01/12/2022   PROT 5.9 (L) 04/11/2022   ALBUMIN 3.0 (L) 04/11/2022   BILITOT 2.2 (H) 04/11/2022   ALKPHOS 63 04/11/2022   AST 12 (L) 04/11/2022   ALT 10 04/11/2022   ANIONGAP 11 04/12/2022     CBG (last 3)  No results for input(s): "GLUCAP" in the last 72 hours.     Coagulation Profile: Recent Labs  Lab 04/09/22 1521  INR 1.6*     Radiology Studies: I have personally reviewed the imaging studies  No results found.     Marry Kusch M.D.  [facility] 04/15/2022, 12:37 PM  Available via Epic secure chat 7am-7pm After 7 pm, please refer to night coverage provider listed on amion.

## 2022-04-15 NOTE — Progress Notes (Signed)
The patient hasn't slept the whole night. He's Impulsive and tried multiple times to get out of bed. He has pulled his condom cath out x 3 and get replaced each time. He keeps on calling names of his family members. No s/s of pain or any acute distress. Will continue to monitor.

## 2022-04-15 NOTE — Progress Notes (Signed)
Inpatient Rehab Admissions Coordinator:  Await bed availability. Will continue to follow.  Wolfgang Phoenix, MS, CCC-SLP Admissions Coordinator [phone]

## 2022-04-15 NOTE — TOC Progression Note (Signed)
Transition of Care [facility] - Progression Note    Patient Details  Name: Eugene [name] MRN: [id number] Date of Birth: [birth year]  [facility]) CM/SW Contact  Lawerance Sabal, RN Phone Number: 04/15/2022, 3:38 PM  Clinical Narrative:     Sherron Monday w patient's daughter at bedside at request of nurse to clarify DC plan.  Patient's daughter said she does not want to take him home, she wants him to go to CIR. When he is there she wants to have his aids that he uses at home have visitation w him to be able to help him with meals and keep him awake during the day. there is no change to current plan of him waiting for CIR bed  Expected Discharge Plan: IP Rehab Facility Barriers to Discharge: Continued Medical Work up  Expected Discharge Plan and Services Expected Discharge Plan: IP Rehab Facility     Post Acute Care Choice: IP Rehab Living arrangements for the past 2 months: Single Family Home                                       Social Determinants of Health (SDOH) Interventions    Readmission Risk Interventions     No data to display

## 2022-04-15 NOTE — Plan of Care (Signed)
  Problem: Ischemic Stroke/TIA Tissue Perfusion: Goal: Complications of ischemic stroke/TIA will be minimized Outcome: Progressing   Problem: Clinical Measurements: Goal: Ability to maintain clinical measurements within normal limits will improve Outcome: Progressing   Problem: Education: Goal: Knowledge of disease or condition will improve Outcome: Not Progressing   Problem: Self-Care: Goal: Ability to participate in self-care as condition permits will improve Outcome: Not Progressing

## 2022-04-16 ENCOUNTER — Other Ambulatory Visit: Payer: Self-pay

## 2022-04-16 ENCOUNTER — Ambulatory Visit (HOSPITAL_COMMUNITY)
Admission: RE | Admit: 2022-04-16 | Payer: Medicare Other | Source: Ambulatory Visit | Attending: Physician Assistant | Admitting: Physician Assistant

## 2022-04-16 ENCOUNTER — Encounter (HOSPITAL_COMMUNITY): Payer: Self-pay | Admitting: Physical Medicine & Rehabilitation

## 2022-04-16 ENCOUNTER — Inpatient Hospital Stay (HOSPITAL_COMMUNITY)
Admission: RE | Admit: 2022-04-16 | Discharge: 2022-05-10 | DRG: 945 | Disposition: A | Discharge status: Home Health | Payer: Medicare Other | Source: Intra-hospital | Attending: Physical Medicine & Rehabilitation | Admitting: Physical Medicine & Rehabilitation

## 2022-04-16 DIAGNOSIS — I609 Nontraumatic subarachnoid hemorrhage, unspecified: Secondary | ICD-10-CM

## 2022-04-16 DIAGNOSIS — I69020 Aphasia following nontraumatic subarachnoid hemorrhage: Secondary | ICD-10-CM

## 2022-04-16 DIAGNOSIS — F39 Unspecified mood [affective] disorder: Secondary | ICD-10-CM | POA: Diagnosis present

## 2022-04-16 DIAGNOSIS — S066XAD Traumatic subarachnoid hemorrhage with loss of consciousness status unknown, subsequent encounter: Secondary | ICD-10-CM

## 2022-04-16 DIAGNOSIS — S065XAD Traumatic subdural hemorrhage with loss of consciousness status unknown, subsequent encounter: Secondary | ICD-10-CM

## 2022-04-16 DIAGNOSIS — S0083XD Contusion of other part of head, subsequent encounter: Secondary | ICD-10-CM

## 2022-04-16 DIAGNOSIS — L89892 Pressure ulcer of other site, stage 2: Secondary | ICD-10-CM | POA: Diagnosis present

## 2022-04-16 DIAGNOSIS — I4821 Permanent atrial fibrillation: Secondary | ICD-10-CM | POA: Diagnosis present

## 2022-04-16 DIAGNOSIS — R569 Unspecified convulsions: Secondary | ICD-10-CM | POA: Diagnosis present

## 2022-04-16 DIAGNOSIS — I129 Hypertensive chronic kidney disease with stage 1 through stage 4 chronic kidney disease, or unspecified chronic kidney disease: Secondary | ICD-10-CM | POA: Diagnosis present

## 2022-04-16 DIAGNOSIS — Z87891 Personal history of nicotine dependence: Secondary | ICD-10-CM

## 2022-04-16 DIAGNOSIS — S06359D Traumatic hemorrhage of left cerebrum with loss of consciousness of unspecified duration, subsequent encounter: Secondary | ICD-10-CM | POA: Diagnosis present

## 2022-04-16 DIAGNOSIS — N179 Acute kidney failure, unspecified: Secondary | ICD-10-CM | POA: Diagnosis present

## 2022-04-16 DIAGNOSIS — Z8249 Family history of ischemic heart disease and other diseases of the circulatory system: Secondary | ICD-10-CM

## 2022-04-16 DIAGNOSIS — Z7901 Long term (current) use of anticoagulants: Secondary | ICD-10-CM

## 2022-04-16 DIAGNOSIS — R2689 Other abnormalities of gait and mobility: Secondary | ICD-10-CM | POA: Diagnosis present

## 2022-04-16 DIAGNOSIS — G9349 Other encephalopathy: Secondary | ICD-10-CM | POA: Diagnosis present

## 2022-04-16 DIAGNOSIS — Z66 Do not resuscitate: Secondary | ICD-10-CM | POA: Diagnosis present

## 2022-04-16 DIAGNOSIS — S065XAA Traumatic subdural hemorrhage with loss of consciousness status unknown, initial encounter: Secondary | ICD-10-CM | POA: Diagnosis not present

## 2022-04-16 DIAGNOSIS — D62 Acute posthemorrhagic anemia: Secondary | ICD-10-CM | POA: Diagnosis present

## 2022-04-16 DIAGNOSIS — S069X0S Unspecified intracranial injury without loss of consciousness, sequela: Secondary | ICD-10-CM

## 2022-04-16 DIAGNOSIS — W19XXXD Unspecified fall, subsequent encounter: Secondary | ICD-10-CM | POA: Diagnosis present

## 2022-04-16 DIAGNOSIS — I6931 Attention and concentration deficit following cerebral infarction: Secondary | ICD-10-CM

## 2022-04-16 DIAGNOSIS — S069X4S Unspecified intracranial injury with loss of consciousness of 6 hours to 24 hours, sequela: Secondary | ICD-10-CM | POA: Diagnosis not present

## 2022-04-16 DIAGNOSIS — F05 Delirium due to known physiological condition: Secondary | ICD-10-CM | POA: Diagnosis not present

## 2022-04-16 DIAGNOSIS — Z91048 Other nonmedicinal substance allergy status: Secondary | ICD-10-CM

## 2022-04-16 DIAGNOSIS — G9340 Encephalopathy, unspecified: Secondary | ICD-10-CM

## 2022-04-16 DIAGNOSIS — E440 Moderate protein-calorie malnutrition: Secondary | ICD-10-CM | POA: Diagnosis present

## 2022-04-16 DIAGNOSIS — L89312 Pressure ulcer of right buttock, stage 2: Secondary | ICD-10-CM | POA: Diagnosis present

## 2022-04-16 DIAGNOSIS — T50915A Adverse effect of multiple unspecified drugs, medicaments and biological substances, initial encounter: Secondary | ICD-10-CM | POA: Diagnosis not present

## 2022-04-16 DIAGNOSIS — I350 Nonrheumatic aortic (valve) stenosis: Secondary | ICD-10-CM | POA: Diagnosis present

## 2022-04-16 DIAGNOSIS — J189 Pneumonia, unspecified organism: Secondary | ICD-10-CM | POA: Diagnosis not present

## 2022-04-16 DIAGNOSIS — I7143 Infrarenal abdominal aortic aneurysm, without rupture: Secondary | ICD-10-CM | POA: Diagnosis present

## 2022-04-16 DIAGNOSIS — Z6823 Body mass index (BMI) 23.0-23.9, adult: Secondary | ICD-10-CM

## 2022-04-16 DIAGNOSIS — S062X0A Diffuse traumatic brain injury without loss of consciousness, initial encounter: Secondary | ICD-10-CM

## 2022-04-16 DIAGNOSIS — R4701 Aphasia: Secondary | ICD-10-CM | POA: Diagnosis present

## 2022-04-16 DIAGNOSIS — Z8673 Personal history of transient ischemic attack (TIA), and cerebral infarction without residual deficits: Secondary | ICD-10-CM

## 2022-04-16 DIAGNOSIS — N183 Chronic kidney disease, stage 3 unspecified: Secondary | ICD-10-CM | POA: Diagnosis present

## 2022-04-16 DIAGNOSIS — J810 Acute pulmonary edema: Secondary | ICD-10-CM | POA: Diagnosis not present

## 2022-04-16 DIAGNOSIS — S066XAA Traumatic subarachnoid hemorrhage with loss of consciousness status unknown, initial encounter: Secondary | ICD-10-CM | POA: Diagnosis not present

## 2022-04-16 DIAGNOSIS — T50905A Adverse effect of unspecified drugs, medicaments and biological substances, initial encounter: Secondary | ICD-10-CM | POA: Insufficient documentation

## 2022-04-16 DIAGNOSIS — G4720 Circadian rhythm sleep disorder, unspecified type: Secondary | ICD-10-CM | POA: Diagnosis not present

## 2022-04-16 DIAGNOSIS — K219 Gastro-esophageal reflux disease without esophagitis: Secondary | ICD-10-CM | POA: Diagnosis present

## 2022-04-16 DIAGNOSIS — Z888 Allergy status to other drugs, medicaments and biological substances status: Secondary | ICD-10-CM

## 2022-04-16 DIAGNOSIS — J439 Emphysema, unspecified: Secondary | ICD-10-CM | POA: Diagnosis present

## 2022-04-16 DIAGNOSIS — M1612 Unilateral primary osteoarthritis, left hip: Secondary | ICD-10-CM | POA: Diagnosis not present

## 2022-04-16 DIAGNOSIS — Z79899 Other long term (current) drug therapy: Secondary | ICD-10-CM

## 2022-04-16 DIAGNOSIS — N189 Chronic kidney disease, unspecified: Secondary | ICD-10-CM | POA: Diagnosis present

## 2022-04-16 DIAGNOSIS — I69220 Aphasia following other nontraumatic intracranial hemorrhage: Secondary | ICD-10-CM

## 2022-04-16 DIAGNOSIS — I69291 Dysphagia following other nontraumatic intracranial hemorrhage: Secondary | ICD-10-CM

## 2022-04-16 DIAGNOSIS — M25512 Pain in left shoulder: Secondary | ICD-10-CM | POA: Diagnosis present

## 2022-04-16 DIAGNOSIS — J90 Pleural effusion, not elsewhere classified: Secondary | ICD-10-CM | POA: Diagnosis present

## 2022-04-16 DIAGNOSIS — S069XAA Unspecified intracranial injury with loss of consciousness status unknown, initial encounter: Secondary | ICD-10-CM | POA: Diagnosis present

## 2022-04-16 DIAGNOSIS — L89896 Pressure-induced deep tissue damage of other site: Secondary | ICD-10-CM | POA: Diagnosis present

## 2022-04-16 DIAGNOSIS — S069X1S Unspecified intracranial injury with loss of consciousness of 30 minutes or less, sequela: Secondary | ICD-10-CM | POA: Diagnosis not present

## 2022-04-16 DIAGNOSIS — I69314 Frontal lobe and executive function deficit following cerebral infarction: Secondary | ICD-10-CM

## 2022-04-16 DIAGNOSIS — I69091 Dysphagia following nontraumatic subarachnoid hemorrhage: Secondary | ICD-10-CM

## 2022-04-16 DIAGNOSIS — M25519 Pain in unspecified shoulder: Secondary | ICD-10-CM | POA: Diagnosis present

## 2022-04-16 DIAGNOSIS — I482 Chronic atrial fibrillation, unspecified: Secondary | ICD-10-CM | POA: Diagnosis not present

## 2022-04-16 DIAGNOSIS — L03114 Cellulitis of left upper limb: Secondary | ICD-10-CM | POA: Diagnosis not present

## 2022-04-16 DIAGNOSIS — M19012 Primary osteoarthritis, left shoulder: Secondary | ICD-10-CM | POA: Diagnosis not present

## 2022-04-16 DIAGNOSIS — I69120 Aphasia following nontraumatic intracerebral hemorrhage: Secondary | ICD-10-CM

## 2022-04-16 DIAGNOSIS — M4802 Spinal stenosis, cervical region: Secondary | ICD-10-CM | POA: Diagnosis present

## 2022-04-16 DIAGNOSIS — E785 Hyperlipidemia, unspecified: Secondary | ICD-10-CM | POA: Diagnosis present

## 2022-04-16 DIAGNOSIS — I10 Essential (primary) hypertension: Secondary | ICD-10-CM | POA: Diagnosis not present

## 2022-04-16 DIAGNOSIS — M25552 Pain in left hip: Secondary | ICD-10-CM | POA: Diagnosis not present

## 2022-04-16 DIAGNOSIS — I951 Orthostatic hypotension: Secondary | ICD-10-CM | POA: Diagnosis not present

## 2022-04-16 DIAGNOSIS — R410 Disorientation, unspecified: Secondary | ICD-10-CM | POA: Insufficient documentation

## 2022-04-16 DIAGNOSIS — Z96612 Presence of left artificial shoulder joint: Secondary | ICD-10-CM | POA: Diagnosis not present

## 2022-04-16 DIAGNOSIS — S069X4D Unspecified intracranial injury with loss of consciousness of 6 hours to 24 hours, subsequent encounter: Secondary | ICD-10-CM | POA: Diagnosis not present

## 2022-04-16 HISTORY — DX: Unspecified convulsions: R56.9

## 2022-04-16 MED ORDER — POLYETHYLENE GLYCOL 3350 17 G PO PACK: 17.0000 g | PACK | Freq: Every day | ORAL | Status: DC | PRN

## 2022-04-16 MED ORDER — FUROSEMIDE 20 MG PO TABS: 20.0000 mg | ORAL_TABLET | Freq: Every day | ORAL | Status: DC

## 2022-04-16 MED ORDER — ALUM & MAG HYDROXIDE-SIMETH 200-200-20 MG/5ML PO SUSP: 30.0000 mL | ORAL | Status: DC | PRN

## 2022-04-16 MED ORDER — OLANZAPINE 2.5 MG PO TABS: 2.5000 mg | ORAL_TABLET | Freq: Every day | ORAL | Status: DC

## 2022-04-16 MED ORDER — MELATONIN 5 MG PO TABS: 5.0000 mg | ORAL_TABLET | Freq: Every day | ORAL | Status: DC

## 2022-04-16 MED ORDER — FLEET ENEMA 7-19 GM/118ML RE ENEM: 1.0000 | ENEMA | Freq: Once | RECTAL | Status: DC | PRN

## 2022-04-16 MED ORDER — LEVETIRACETAM 250 MG PO TABS
500.0000 mg | ORAL_TABLET | Freq: Two times a day (BID) | ORAL | Status: DC
  Administered 2022-04-16 – 2022-05-10 (×44): 500 mg via ORAL
  Filled 2022-04-16 (×48): qty 2

## 2022-04-16 MED ORDER — GUAIFENESIN-DM 100-10 MG/5ML PO SYRP
5.0000 mL | ORAL_SOLUTION | Freq: Four times a day (QID) | ORAL | Status: DC | PRN
  Administered 2022-04-16 – 2022-05-08 (×2): 10 mL via ORAL
  Filled 2022-04-16 (×2): qty 10

## 2022-04-16 MED ORDER — PROCHLORPERAZINE MALEATE 5 MG PO TABS: 5.0000 mg | ORAL_TABLET | Freq: Four times a day (QID) | ORAL | Status: DC | PRN

## 2022-04-16 MED ORDER — OLANZAPINE 2.5 MG PO TABS
2.5000 mg | ORAL_TABLET | Freq: Every day | ORAL | Status: DC
  Filled 2022-04-16: qty 1

## 2022-04-16 MED ORDER — METOPROLOL TARTRATE 5 MG/5ML IV SOLN: 2.5000 mg | Freq: Four times a day (QID) | INTRAVENOUS | Status: DC | PRN

## 2022-04-16 MED ORDER — TAMSULOSIN HCL 0.4 MG PO CAPS
0.4000 mg | ORAL_CAPSULE | Freq: Every day | ORAL | Status: DC
  Administered 2022-04-16 – 2022-05-02 (×17): 0.4 mg via ORAL
  Filled 2022-04-16 (×19): qty 1

## 2022-04-16 MED ORDER — THIAMINE MONONITRATE 100 MG PO TABS
100.0000 mg | ORAL_TABLET | Freq: Every day | ORAL | Status: DC
  Administered 2022-04-16 – 2022-05-10 (×25): 100 mg via ORAL
  Filled 2022-04-16 (×25): qty 1

## 2022-04-16 MED ORDER — PROSOURCE PLUS PO LIQD
30.0000 mL | Freq: Two times a day (BID) | ORAL | Status: DC
  Administered 2022-04-17 – 2022-05-09 (×33): 30 mL via ORAL
  Filled 2022-04-16 (×31): qty 30

## 2022-04-16 MED ORDER — ORAL CARE MOUTH RINSE: 15.0000 mL | OROMUCOSAL | Status: DC

## 2022-04-16 MED ORDER — PROCHLORPERAZINE 25 MG RE SUPP: 12.5000 mg | Freq: Four times a day (QID) | RECTAL | Status: DC | PRN

## 2022-04-16 MED ORDER — SCOPOLAMINE 1 MG/3DAYS TD PT72
1.0000 | MEDICATED_PATCH | TRANSDERMAL | Status: DC
  Administered 2022-04-19 – 2022-05-10 (×8): 1.5 mg via TRANSDERMAL
  Filled 2022-04-16 (×8): qty 1

## 2022-04-16 MED ORDER — ZINC SULFATE 220 (50 ZN) MG PO CAPS
220.0000 mg | ORAL_CAPSULE | Freq: Every day | ORAL | Status: DC
  Administered 2022-04-16 – 2022-05-10 (×25): 220 mg via ORAL
  Filled 2022-04-16 (×25): qty 1

## 2022-04-16 MED ORDER — HYDRALAZINE HCL 20 MG/ML IJ SOLN: 5.0000 mg | Freq: Four times a day (QID) | INTRAMUSCULAR | Status: DC | PRN

## 2022-04-16 MED ORDER — METOPROLOL TARTRATE 50 MG PO TABS: 50.0000 mg | ORAL_TABLET | Freq: Two times a day (BID) | ORAL | Status: DC

## 2022-04-16 MED ORDER — NIMODIPINE 6 MG/ML PO SOLN: 60.0000 mg | ORAL | Status: DC

## 2022-04-16 MED ORDER — METOPROLOL TARTRATE 50 MG PO TABS
50.0000 mg | ORAL_TABLET | Freq: Two times a day (BID) | ORAL | Status: DC
  Administered 2022-04-16 – 2022-04-20 (×8): 50 mg via ORAL
  Filled 2022-04-16 (×8): qty 1

## 2022-04-16 MED ORDER — OLANZAPINE 2.5 MG PO TABS
2.5000 mg | ORAL_TABLET | Freq: Every day | ORAL | Status: DC
  Administered 2022-04-16 – 2022-04-18 (×3): 2.5 mg via ORAL
  Filled 2022-04-16 (×3): qty 1

## 2022-04-16 MED ORDER — FAMOTIDINE 20 MG PO TABS: 20.0000 mg | ORAL_TABLET | Freq: Two times a day (BID) | ORAL | Status: DC

## 2022-04-16 MED ORDER — NIMODIPINE 6 MG/ML PO SOLN
60.0000 mg | ORAL | Status: DC
  Filled 2022-04-16 (×4): qty 10

## 2022-04-16 MED ORDER — PROCHLORPERAZINE EDISYLATE 10 MG/2ML IJ SOLN: 5.0000 mg | Freq: Four times a day (QID) | INTRAMUSCULAR | Status: DC | PRN

## 2022-04-16 MED ORDER — BISACODYL 10 MG RE SUPP: 10.0000 mg | Freq: Every day | RECTAL | Status: DC | PRN

## 2022-04-16 MED ORDER — NIMODIPINE 6 MG/ML PO SOLN
60.0000 mg | ORAL | Status: AC
  Administered 2022-04-17 – 2022-05-01 (×77): 60 mg via ORAL
  Filled 2022-04-16 (×95): qty 10

## 2022-04-16 MED ORDER — DIPHENHYDRAMINE HCL 12.5 MG/5ML PO ELIX
12.5000 mg | ORAL_SOLUTION | Freq: Four times a day (QID) | ORAL | Status: DC | PRN
  Administered 2022-04-30: 25 mg via ORAL
  Filled 2022-04-16: qty 10

## 2022-04-16 MED ORDER — DICLOFENAC SODIUM 1 % EX GEL
2.0000 g | Freq: Four times a day (QID) | CUTANEOUS | Status: DC
  Administered 2022-04-16 – 2022-05-09 (×60): 2 g via TOPICAL
  Filled 2022-04-16: qty 100

## 2022-04-16 MED ORDER — FAMOTIDINE 20 MG PO TABS
20.0000 mg | ORAL_TABLET | Freq: Two times a day (BID) | ORAL | Status: DC
  Administered 2022-04-16 – 2022-05-03 (×34): 20 mg via ORAL
  Filled 2022-04-16 (×34): qty 1

## 2022-04-16 MED ORDER — VITAMIN C 500 MG PO TABS
500.0000 mg | ORAL_TABLET | Freq: Every day | ORAL | Status: DC
  Administered 2022-04-16 – 2022-05-10 (×25): 500 mg via ORAL
  Filled 2022-04-16 (×25): qty 1

## 2022-04-16 MED ORDER — ORAL CARE MOUTH RINSE: 15.0000 mL | OROMUCOSAL | Status: DC | PRN

## 2022-04-16 MED ORDER — OMEPRAZOLE 20 MG PO CPDR
20.0000 mg | DELAYED_RELEASE_CAPSULE | Freq: Every day | ORAL | Status: DC
  Administered 2022-04-17 – 2022-05-10 (×24): 20 mg via ORAL
  Filled 2022-04-16 (×26): qty 1

## 2022-04-16 MED ORDER — SCOPOLAMINE 1 MG/3DAYS TD PT72: 1.0000 | MEDICATED_PATCH | TRANSDERMAL | 12 refills | Status: AC

## 2022-04-16 MED ORDER — TAMSULOSIN HCL 0.4 MG PO CAPS: 0.4000 mg | ORAL_CAPSULE | Freq: Every day | ORAL | Status: DC

## 2022-04-16 MED ORDER — DOXYCYCLINE HYCLATE 100 MG PO TABS
100.0000 mg | ORAL_TABLET | Freq: Two times a day (BID) | ORAL | Status: AC
  Administered 2022-04-16 – 2022-04-23 (×14): 100 mg via ORAL
  Filled 2022-04-16 (×14): qty 1

## 2022-04-16 MED ORDER — LEVETIRACETAM 500 MG PO TABS
500.0000 mg | ORAL_TABLET | Freq: Two times a day (BID) | ORAL | Status: DC
  Filled 2022-04-16: qty 1

## 2022-04-16 MED ORDER — SCOPOLAMINE 1 MG/3DAYS TD PT72: 1.0000 | MEDICATED_PATCH | TRANSDERMAL | Status: DC

## 2022-04-16 MED ORDER — ACETAMINOPHEN 325 MG PO TABS
325.0000 mg | ORAL_TABLET | ORAL | Status: DC | PRN
  Administered 2022-04-17: 650 mg via ORAL
  Filled 2022-04-16: qty 2

## 2022-04-16 MED ORDER — TRAZODONE HCL 50 MG PO TABS
25.0000 mg | ORAL_TABLET | Freq: Every evening | ORAL | Status: DC | PRN
  Administered 2022-04-18 – 2022-04-29 (×8): 50 mg via ORAL
  Filled 2022-04-16 (×8): qty 1

## 2022-04-16 MED ORDER — MELATONIN 5 MG PO TABS: 5.0000 mg | ORAL_TABLET | Freq: Every day | ORAL | 0 refills | Status: DC

## 2022-04-16 NOTE — Progress Notes (Signed)
Patient ID: Eugene [name], male   DOB: 1936/03/22, 86 y.o.   MRN: [id number]   Pt arrived to 4M03 per bed. Assessments and vitals obtained. Pt oriented to self, family at bedside. Oriented to rehab and policies reviewed with family. Call bell in reach, bed alarm on and in low position. Mylo Red, LPN

## 2022-04-16 NOTE — Discharge Summary (Addendum)
Physician Discharge Summary   Patient: Eugene [name] MRN: [id number] DOB: 10/19/1935  Admit date:     04/09/2022  Discharge date: 04/16/22  Discharge Physician: Thad Ranger, MD    PCP: [name], MD   Recommendations at discharge:  ,  Started on olanzapine 2.5 mg at bedtime, titrate up if needed for sundowning and agitation  Discharge Diagnoses:    Subdural hematoma (HCC)   GERD (gastroesophageal reflux disease)   Seizure (HCC)   Chronic anticoagulation   History of CVA (cerebrovascular accident)   Debility   SAH (subarachnoid hemorrhage) (HCC) Acute metabolic encephalopathy History of seizure disorder   Hospital Course:  86 y.o. male with medical history significant of chronic A-fib on Eliquis, seizure disorder on Keppra, hemochromatosis, HTN, HLD, chronic ambulation dysfunction with frequent falls, AAA s/p repair, recent UTI, came with fall and strokelike symptoms.  Patient was found to have subarachnoid hemorrhage and subdural hematoma.  He was hospitalized for further management.   Assessment and Plan:  Acute subarachnoid hemorrhage, subdural hematoma, traumatic (HCC) -Secondary to mechanical fall that he sustained a week prior to admission.  Patient presented with stroke like symptoms including expressive aphasia and left 7th nerve palsy. -On Eliquis for atrial fibrillation, currently on hold. -Neurosurgery was consulted.  CT head repeated showed no new changes -Seen by Dr. Yetta Barre, no acute neurosurgical interventions needed, likely okay to restart his anticoagulation in 7 to 10 days.  Will need follow-up head CT in 7 to 10 days if admitted to CIR otherwise follow-up in office 2 weeks after discharge. -Continue Keppra (history of seizures) -Continue nimodipine 60 mg every 4 hours x 21 days for [facility] -PT evaluation recommended CIR     Acute metabolic encephalopathy, appears to have underlying dementia with cognitive deficits, sundowning -UA negative for UTI.   Likely cognitive deficits worsened due to acute SAH, SDH -Patient was placed on Seroquel with no significant effect, changed to olanzapine 2.5 mg at bedtime, continue melatonin   Acute respiratory failure with hypoxia -Noted to have desaturation and hypoxia when he was given his meals and pills on 11/14, appears to have aspirated.  He was placed on 2 L O2 via [place] -SLP evaluation completed, continue regular diet -Chest x-ray showed stable cardiomegaly with interstitial edema -Due to significant secretions, patient was placed on scopolamine patch -Stable, O2 sats 94% on room air   Chronic oropharyngeal dysphagia -Patient was evaluated by SLP, diet advanced to regular diet   Chronic atrial fibrillation -Continue to hold Eliquis -Currently on metoprolol and nimodipine, BP soft   Essential hypertension -Currently on nimodipine and metoprolol -BP stable   History of seizure disorder Continue Keppra, resume p.o. 500 mg twice daily.    Left-sided pleural effusion, acute on chronic Appears to be asymptomatic, continue to monitor for now     Obesity Estimated body mass index is 31.32 kg/m as calculated from the following:   Height as of 02/28/22:  (1.651 m).   Weight as of 02/28/22: 85.4 kg.       Pain control - [employer] Controlled Substance Reporting System database was reviewed. and patient was instructed, not to drive, operate heavy machinery, perform activities at heights, swimming or participation in water activities or provide baby-sitting services while on Pain, Sleep and Anxiety Medications; until their outpatient Physician has advised to do so again. Also recommended to not to take more than prescribed Pain, Sleep and Anxiety Medications.  Consultants: Neurosurgery Procedures performed: none  Disposition CIR Diet recommendation:  DISCHARGE MEDICATION: Allergies as of 04/16/2022       Reactions   Chlorhexidine Gluconate Itching   Don't use Chloraprep inside  port a cath kits.   Macrolides And Ketolides Nausea And Vomiting   Tape Other (See Comments)   Adhesive tape,   Only use paper tape        Medication List     STOP taking these medications    ALPRAZolam 1 MG tablet Commonly known as: XANAX   apixaban 5 MG Tabs tablet Commonly known as: ELIQUIS   metoprolol succinate 100 MG 24 hr tablet Commonly known as: TOPROL-XL   QUEtiapine 25 MG tablet Commonly known as: SEROQUEL       TAKE these medications    acetaminophen 500 MG tablet Commonly known as: TYLENOL Take 500 mg by mouth every 6 (six) hours as needed for moderate pain.   atorvastatin 20 MG tablet Commonly known as: LIPITOR Take 1 tablet (20 mg total) by mouth daily.   cephALEXin 500 MG capsule Commonly known as: KEFLEX Take 500 mg by mouth 2 (two) times daily.   diclofenac Sodium 1 % Gel Commonly known as: VOLTAREN Apply 2 g topically 4 (four) times daily as needed (left shoulder pain).   docusate sodium 100 MG capsule Commonly known as: COLACE TAKE 1 CAPSULE BY MOUTH TWICE A DAY What changed: when to take this   DULoxetine 20 MG capsule Commonly known as: CYMBALTA Take 20 mg by mouth daily.   ferrous sulfate 325 (65 FE) MG tablet Take 1 tablet (325 mg total) by mouth daily with breakfast.   furosemide 20 MG tablet Commonly known as: LASIX Take 1 tablet (20 mg total) by mouth daily. HOLD until follow-up with your PCP What changed: additional instructions   levETIRAcetam 500 MG tablet Commonly known as: KEPPRA Take 1 tablet (500 mg total) by mouth 2 (two) times daily.   melatonin 5 MG Tabs Take 1 tablet (5 mg total) by mouth at bedtime.   metoprolol tartrate 50 MG tablet Commonly known as: LOPRESSOR Take 1 tablet (50 mg total) by mouth 2 (two) times daily.   multivitamin with minerals Tabs tablet Take 1 tablet by mouth daily.   niMODipine 6 MG/ML Soln Commonly known as: NYMALIZE Take 10 mLs (60 mg total) by mouth every 4 (four) hours for  21 days.   OLANZapine 2.5 MG tablet Commonly known as: ZYPREXA Take 1 tablet (2.5 mg total) by mouth at bedtime.   omeprazole 20 MG tablet Commonly known as: PRILOSEC OTC Take 1 tablet (20 mg total) by mouth daily.   polyethylene glycol 17 g packet Commonly known as: MIRALAX / GLYCOLAX Take 17 g by mouth daily as needed for mild constipation.   scopolamine 1 MG/3DAYS Commonly known as: TRANSDERM-SCOP Place 1 patch (1.5 mg total) onto the skin every 3 (three) days. Start taking on: April 19, 2022   tamsulosin 0.4 MG Caps capsule Commonly known as: FLOMAX Take 1 capsule (0.4 mg total) by mouth at bedtime.   VITAMIN B-1 PO Take 1 tablet by mouth in the morning.   VITAMIN C PO Take 1 tablet by mouth in the morning.   VITAMIN D-3 PO Take 1 capsule by mouth in the morning.   ZINC OXIDE EX Apply 1 application  topically See admin instructions. Apply topically diaper area every time pt uses bathroom as a skin barrier               Discharge Care Instructions  (From admission, onward)  Start     Ordered   04/16/22 0000  If the dressing is still on your incision site when you go home, remove it on the third day after your surgery date. Remove dressing if it begins to fall off, or if it is dirty or damaged before the third day.        04/16/22 1142            Follow-up Information     Tia Alert, MD. Schedule an appointment as soon as possible for a visit in 2 week(s).   Specialty: Neurosurgery Contact information: [address] [place] [address] [phone]         [name], MD. Schedule an appointment as soon as possible for a visit in 2 week(s).   Specialty: Internal Medicine Why: for hospital follow-up Contact information: [address] [place] [address] [phone]                Discharge Exam: S: No acute complaints.  Awaiting CIR   Vitals:   04/15/22 1911  04/16/22 0046 04/16/22 0500 04/16/22 0753  BP: 120/70 113/68 119/68 119/68  Pulse: 94 67 78 67  Resp: Temp: 98.1 F (36.7 C) 98.5 F (36.9 C) 98.1 F (36.7 C) 97.7 F (36.5 C)  TempSrc: Axillary Axillary Oral Oral  SpO2: 98% 90% 96% 98%    Physical Exam General: Alert and oriented x 3, NAD Cardiovascular: S1 S2 clear, RRR.  Respiratory: CTAB, no wheezing, rales or rhonchi Gastrointestinal: Soft, nontender, nondistended, NBS Ext: no pedal edema bilaterally Neuro: no new deficits Psych: flat affect   Condition at discharge: fair  The results of significant diagnostics from this hospitalization (including imaging, microbiology, ancillary and laboratory) are listed below for reference.   Imaging Studies: DG CHEST PORT 1 VIEW  Result Date: 04/11/2022 CLINICAL DATA:  86 year old male with hypoxia. Recent intracranial hemorrhage. EXAM: PORTABLE CHEST 1 VIEW COMPARISON:  Portable chest 04/09/2022 and earlier. FINDINGS: Portable AP semi upright view at 1004 hours. Extensive Calcified aortic atherosclerosis. Stable cardiomegaly and mediastinal contours. Stable lung volumes. Hypo ventilation at the left lung base persists and appears related to pleural effusion. Superimposed pulmonary vascularity/interstitial edema has mildly increased. No pneumothorax. No air bronchograms. No acute osseous abnormality identified. IMPRESSION: 1. Stable cardiomegaly, interstitial edema, left pleural effusion. 2. No new cardiopulmonary abnormality. Aortic Atherosclerosis (ICD10-I70.0). Electronically Signed   By: Odessa Fleming M.D.   On: 04/11/2022 10:18   CT HEAD WO CONTRAST ( )  Result Date: 04/11/2022 CLINICAL DATA:  Subarachnoid hemorrhage [facility]) subdural hematoma, SAH EXAM: CT HEAD WITHOUT CONTRAST TECHNIQUE: Contiguous axial images were obtained from the base of the skull through the vertex without intravenous contrast. RADIATION DOSE REDUCTION: This exam was performed according to the  departmental dose-optimization program which includes automated exposure control, adjustment of the mA and/or kV according to patient size and/or use of iterative reconstruction technique. COMPARISON:  CT head November 13, 23. FINDINGS: Brain: Unchanged hemorrhagic contusion in the anterior left temporal lobe with overlying extra-axial hemorrhage. Similar mild mass effect. Vascular: No hyperdense vessel identified. Skull: No acute fracture. Sinuses/Orbits: Clear sinuses.  No acute orbital findings. Other: No mastoid effusions. IMPRESSION: Unchanged hemorrhagic contusion in the anterior left temporal lobe with overlying extra-axial hemorrhage. Similar mild mass effect. No significant midline shift. Evidence of acute large vascular territory infarct, mass lesion, or hydrocephalus. Cerebral atrophy and ex vacuo ventricular dilation. Electronically Signed   By: Gelene Mink  Barnett Applebaum M.D.   On: 04/11/2022 09:07   EEG adult  Result Date: 04/10/2022 Charlsie Quest, MD     04/10/2022  8:50 AM Patient Name: CAMBRIDGE DELEO MRN: [id number] Epilepsy Attending: Charlsie Quest Referring Physician/Provider: Gloris Manchester, MD Date:04/09/2022 Duration: 22.23 mins Patient history: 86 year old male with seizures, now with subdural hematoma and subarachnoid hemorrhage.  EEG to evaluate for seizure. Level of alertness: Awake AEDs during EEG study: Xanax, LEV Technical aspects: This EEG study was done with scalp electrodes positioned according to the 10-20 International system of electrode placement. Electrical activity was reviewed with band pass filter of 1-70Hz , sensitivity of 7 uV/mm, display speed of 66mm/sec with a 60Hz  notched filter applied as appropriate. EEG data were recorded continuously and digitally stored.  Video monitoring was available and reviewed as appropriate. Description: The posterior dominant rhythm consists of 8 Hz activity of moderate voltage (25-35 uV) seen predominantly in posterior head regions,  symmetric and reactive to eye opening and eye closing.  2 to 3 Hz rhythmic, sharply contoured delta slowing was seen in left hemisphere, maximal left temporal region. Intermittent generalized 3 to 5-second episode was also noted.  Hyperventilation and photic stimulation were not performed.   ABNORMALITY - Lateralized rhythmic delta activity, left hemisphere - Intermittent slow, generalized IMPRESSION: This study is suggestive of cortical dysfunction in left hemisphere most likely secondary to underlying structural abnormality.  Additionally there is mild diffuse encephalopathy, nonspecific etiology.  No seizures or definite epileptiform discharges were seen throughout the recording.   CT Head Wo Contrast  Result Date: 04/09/2022 CLINICAL DATA:  Trauma EXAM: CT HEAD WITHOUT CONTRAST CT CERVICAL SPINE WITHOUT CONTRAST TECHNIQUE: Multidetector CT imaging of the head and cervical spine was performed following the standard protocol without intravenous contrast. Multiplanar CT image reconstructions of the cervical spine were also generated. RADIATION DOSE REDUCTION: This exam was performed according to the departmental dose-optimization program which includes automated exposure control, adjustment of the mA and/or kV according to patient size and/or use of iterative reconstruction technique. COMPARISON:  CT head and cervical spine 08/25/2021, MR head 08/26/2021 FINDINGS: CT HEAD FINDINGS Brain: There is a hemorrhagic contusion in the left temporal lobe measuring 1.8 cm AP x 1.2 cm TV x 1.1 cm cc with mild surrounding edema. There is overlying subarachnoid hemorrhage as well as subdural hematoma centered over the temporal lobe measuring up to 7 mm in maximal thickness. Additional subdural blood is seen layering along the falx posteriorly and left tentorial leaflet measuring up to 4 mm in thickness. Is no definite intraventricular hemorrhage. The subdural blood exerts mild mass effect on the underlying  brain parenchyma with no midline shift. There is no evidence of acute territorial infarct. Background parenchymal volume is stable. The ventricles are stable in size. Hypodensity in the supratentorial white matter consistent with chronic small vessel ischemic change is stable. There is no mass lesion.  There is no mass effect or midline shift. Vascular: No hyperdense vessel or unexpected calcification. Skull: Normal. Negative for fracture or focal lesion. Sinuses/Orbits: The paranasal sinuses are clear. Bilateral lens implants are in place. The globes and orbits are otherwise unremarkable. Other: None. CT CERVICAL SPINE FINDINGS Alignment: Normal. There is no jumped or perched facet or other evidence of traumatic malalignment. Skull base and vertebrae: Skull base alignment is maintained. Vertebral body heights are preserved. There is no evidence of acute fracture. There is no suspicious osseous lesion. Soft tissues and spinal canal: No prevertebral fluid or swelling.  No visible canal hematoma. Disc levels: There is overall mild multilevel disc space narrowing and degenerative endplate change. Facet arthropathy is most advanced on the left at C3-C4 and on the right at C5-C6. There is no evidence of high-grade spinal canal stenosis. Upper chest: There is background emphysema in the lung apices. There is an at least moderate-sized left pleural effusion. Other: None. IMPRESSION: 1. 1.8 cm hemorrhagic contusion in the left anterior temporal lobe as well as overlying subdural and subarachnoid hemorrhage. Mild mass effect on the underlying brain parenchyma but no midline shift. 2. No acute fracture or traumatic malalignment of the cervical spine. 3. Moderate-sized left pleural effusion. Critical Value/emergent results were called by telephone at the time of interpretation on 04/09/2022 at 3:18 pm to provider HAYLEY NAASZ , who verbally acknowledged these results. Electronically Signed   By: Lesia Hausen M.D.   On:  04/09/2022 15:39   CT Cervical Spine Wo Contrast  Result Date: 04/09/2022 CLINICAL DATA:  Trauma EXAM: CT HEAD WITHOUT CONTRAST CT CERVICAL SPINE WITHOUT CONTRAST TECHNIQUE: Multidetector CT imaging of the head and cervical spine was performed following the standard protocol without intravenous contrast. Multiplanar CT image reconstructions of the cervical spine were also generated. RADIATION DOSE REDUCTION: This exam was performed according to the departmental dose-optimization program which includes automated exposure control, adjustment of the mA and/or kV according to patient size and/or use of iterative reconstruction technique. COMPARISON:  CT head and cervical spine 08/25/2021, MR head 08/26/2021 FINDINGS: CT HEAD FINDINGS Brain: There is a hemorrhagic contusion in the left temporal lobe measuring 1.8 cm AP x 1.2 cm TV x 1.1 cm cc with mild surrounding edema. There is overlying subarachnoid hemorrhage as well as subdural hematoma centered over the temporal lobe measuring up to 7 mm in maximal thickness. Additional subdural blood is seen layering along the falx posteriorly and left tentorial leaflet measuring up to 4 mm in thickness. Is no definite intraventricular hemorrhage. The subdural blood exerts mild mass effect on the underlying brain parenchyma with no midline shift. There is no evidence of acute territorial infarct. Background parenchymal volume is stable. The ventricles are stable in size. Hypodensity in the supratentorial white matter consistent with chronic small vessel ischemic change is stable. There is no mass lesion.  There is no mass effect or midline shift. Vascular: No hyperdense vessel or unexpected calcification. Skull: Normal. Negative for fracture or focal lesion. Sinuses/Orbits: The paranasal sinuses are clear. Bilateral lens implants are in place. The globes and orbits are otherwise unremarkable. Other: None. CT CERVICAL SPINE FINDINGS Alignment: Normal. There is no jumped or  perched facet or other evidence of traumatic malalignment. Skull base and vertebrae: Skull base alignment is maintained. Vertebral body heights are preserved. There is no evidence of acute fracture. There is no suspicious osseous lesion. Soft tissues and spinal canal: No prevertebral fluid or swelling. No visible canal hematoma. Disc levels: There is overall mild multilevel disc space narrowing and degenerative endplate change. Facet arthropathy is most advanced on the left at C3-C4 and on the right at C5-C6. There is no evidence of high-grade spinal canal stenosis. Upper chest: There is background emphysema in the lung apices. There is an at least moderate-sized left pleural effusion. Other: None. IMPRESSION: 1. 1.8 cm hemorrhagic contusion in the left anterior temporal lobe as well as overlying subdural and subarachnoid hemorrhage. Mild mass effect on the underlying brain parenchyma but no midline shift. 2. No acute fracture or traumatic malalignment of the cervical spine. 3. Moderate-sized  left pleural effusion. Critical Value/emergent results were called by telephone at the time of interpretation on 04/09/2022 at 3:18 pm to provider HAYLEY NAASZ , who verbally acknowledged these results. Electronically Signed   By: Lesia HausenPeter  Noone M.D.   On: 04/09/2022 15:39   DG Chest Portable 1 View  Result Date: 04/09/2022 CLINICAL DATA:  Fall, altered mental status. EXAM: PORTABLE CHEST 1 VIEW COMPARISON:  Chest radiograph dated February 28, 2022 FINDINGS: The heart is enlarged. Prominent aortic atherosclerotic calcifications. Right lung is clear. Left basilar opacity suggesting atelectasis and/small effusion, unchanged. IMPRESSION: Left basilar opacity suggesting atelectasis and/small effusion, unchanged. Electronically Signed   By: Larose HiresImran  Ahmed D.O.   On: 04/09/2022 15:32    Microbiology: Results for orders placed or performed during the hospital encounter of 01/17/22  Culture, blood (Routine X 2) w Reflex to ID Panel      Status: None   Collection Time: 01/17/22  8:35 PM   Specimen: BLOOD RIGHT FOREARM  Result Value Ref Range Status   Specimen Description BLOOD RIGHT FOREARM  Final   Special Requests   Final    BOTTLES DRAWN AEROBIC AND ANAEROBIC Blood Culture adequate volume   Culture   Final    NO GROWTH 5 DAYS Performed at [facility], [address] [place], [place] [address]    Report Status 01/22/2022 FINAL  Final  Culture, blood (Routine X 2) w Reflex to ID Panel     Status: None   Collection Time: 01/17/22  8:39 PM   Specimen: BLOOD RIGHT HAND  Result Value Ref Range Status   Specimen Description BLOOD RIGHT HAND  Final   Special Requests   Final    BOTTLES DRAWN AEROBIC AND ANAEROBIC Blood Culture adequate volume   Culture   Final    NO GROWTH 5 DAYS Performed at [facility], [address] [place], [place] [address]    Report Status 01/22/2022 FINAL  Final  Urine Culture     Status: None   Collection Time: 01/18/22 11:57 AM   Specimen: In/Out Cath Urine  Result Value Ref Range Status   Specimen Description IN/OUT CATH URINE  Final   Special Requests NONE  Final   Culture   Final    NO GROWTH Performed at [facility], [address] [place], [place] [address]    Report Status 01/19/2022 FINAL  Final    Labs: CBC: Recent Labs  Lab 04/09/22 1521 04/11/22 0606 04/12/22 0400  WBC 7.8 7.4 7.5  NEUTROABS 5.5  --   --   HGB 12.0* 11.6* 10.8*  HCT 36.8* 33.1* 31.8*  MCV 94.6 89.2 89.3  PLT 228 213 222   Basic Metabolic Panel: Recent Labs  Lab 04/09/22 1521 04/11/22 0606 04/12/22 0400  NA 133* 135 132*  K 4.1 3.3* 3.5  CL 95* 100 96*  CO2 24 23 25   GLUCOSE 99 84 105*  BUN 14 13 17   CREATININE 1.13 1.16 1.13  CALCIUM 9.2 8.7* 8.9  MG 2.0  --  1.8   Liver Function Tests: Recent Labs  Lab 04/09/22 1521 04/11/22 0606  AST 17 12*  ALT 11 10  ALKPHOS 72 63  BILITOT 1.5* 2.2*  PROT 6.3* 5.9*  ALBUMIN 3.4* 3.0*   CBG: Recent Labs  Lab  04/09/22 1437  GLUCAP 94    Discharge time spent: greater than 30 minutes.  Signed: Thad Rangeripudeep Sai Moura, MD [facility] 04/16/2022

## 2022-04-16 NOTE — Plan of Care (Signed)

## 2022-04-16 NOTE — H&P (Shared)
Physical Medicine and Rehabilitation Admission H&P    CC: Functional deficits due to TBI   HPI:  Eugene [name] is an 86 year old male with history of AS, NSVT/A fib- on Eliquis, CKD, hemochromatosis; known to CIR from stay 12/2021 after fall with rib/Fx and L-HPTX who was admitted on 04/09/22 after fall on a week PTA on 04/09/22. Per reports, he started developing difficulty taking 2-3 days PTA progressing to inability to speak. CT head done revealing 1.8 cm hemorrhagic contusion in left anterior temporal lobe as well as overlying SDH/SAH with mild mass effect and moderate left pleural effusion noted.  CT C-spine showed mild multilevel degenerative changes with facet arthropathy and nos high grade spinal stenosis. CXR showed no change in left pleural effusion c/w 10/04 films.  Dr. Ronnald Ramp evaluated patient and felt that no surgical intervention needed and to resume anticoagulation after a week with repeat CT head.  Follow up CT head showed no change in hemorrhagic contusion or mass effect.   EEG done showed mild diffuse encephalopathy without seizures or epileptiform discharges.  He was maintained on Keppra for  7 days for seizure prophylaxis--no history of seizure documented in PMHx. Speech therapy evaluation revealed severe expressive and moderate receptive aphasia with poor frustration tolerance and no attempts to communicate verbally or nonverbally.  He did have decline in respiratory status requiring 4 L O2 per [place] while in ED and scopolamine patch added due to copious oral secretions. Per NS note 11/15-->to start oral AC in 7-10 days and repeat CT head in 7-10 days. He continues to have sleep wake disruption with agitation and multiple attempts out of bed last night -->Seroquel changed to Zyprexa today. Therapy has been working with patient who continues to be limited by balance deficits, strong left lateral lean with fatigue, expressive/receptive deficits with delay in processing and difficulty  following one step commands consistently. CIR recommended due to functional decline.    Review of Systems  Constitutional:  Negative for chills and fever.  HENT:  Positive for hearing loss (have to  be in front of him).   Eyes:  Negative for blurred vision and double vision.  Respiratory:  Positive for cough.   Cardiovascular:  Negative for chest pain.  Gastrointestinal:  Negative for constipation and heartburn.  Neurological:  Positive for speech change and weakness.  Psychiatric/Behavioral:  Positive for memory loss. The patient has insomnia.     Past Medical History:  Diagnosis Date   AAA (abdominal aortic aneurysm) ([place]) 06/29/2007   stent graft   Aortic stenosis 09/05/2017   Mild AS/AR by echo 01/2017.   GERD (gastroesophageal reflux disease)    Hemochromatosis    Hyperlipidemia    Hypertension    Irritable bowel syndrome    Permanent atrial fibrillation ([facility])    Stroke ([facility])    Transient Ischemic Attack    Past Surgical History:  Procedure Laterality Date   ABDOMINAL AORTIC ANEURYSM REPAIR     ENDOVASCULAR STENT INSERTION  07/18/2007   saccular infrarenal aortic aneurysm   EYE SURGERY Bilateral    Cataract     IR FLUORO GUIDE CV LINE LEFT  12/08/2018   IR FLUORO GUIDE CV LINE RIGHT  02/13/2017   IR FLUORO GUIDE CV LINE RIGHT  02/25/2017   IR US GUIDE VASC ACCESS RIGHT  02/13/2017   PICC LINE PLACE PERIPHERAL ([facility])  Oct 21, 2014    Family History  Problem Relation Age of Onset   Hypertension Mother  Heart disease Mother        AAA, he passed in his 67 s of Heart Diseasse   Heart disease Father        AAA history and he passed at age 79  of   Disease    Social History:  Widowed. Has sitters around the clock.  reports that he quit smoking about 22 years ago. His smoking use included cigarettes. He has a 40.00 pack-year smoking history. He has never used smokeless tobacco. He reports current alcohol use--one beer and shot of whiskey (small bottle watered down)  daily. He reports that he does not use drugs.   Allergies  Allergen Reactions   Chlorhexidine Gluconate Itching    Don't use Chloraprep inside port a cath kits.   Macrolides And Ketolides Nausea And Vomiting   Tape Other (See Comments)    Adhesive tape,   Only use paper tape    Medications Prior to Admission  Medication Sig Dispense Refill   acetaminophen (TYLENOL) 500 MG tablet Take 500 mg by mouth every 6 (six) hours as needed for moderate pain.     ALPRAZolam (XANAX) 1 MG tablet Take 0.5 tablets (0.5 mg total) by mouth 2 (two) times daily. For anxiety 14 tablet 0   apixaban (ELIQUIS) 5 MG TABS tablet Take 1 tablet (5 mg total) by mouth 2 (two) times daily. 60 tablet 0   Ascorbic Acid (VITAMIN C PO) Take 1 tablet by mouth in the morning.     atorvastatin (LIPITOR) 20 MG tablet Take 1 tablet (20 mg total) by mouth daily. 90 tablet 3   cephALEXin (KEFLEX) 500 MG capsule Take 500 mg by mouth 2 (two) times daily.     Cholecalciferol (VITAMIN D-3 PO) Take 1 capsule by mouth in the morning.     diclofenac Sodium (VOLTAREN) 1 % GEL Apply 2 g topically 4 (four) times daily as needed (left shoulder pain). 350 g 1   docusate sodium (COLACE) 100 MG capsule TAKE 1 CAPSULE BY MOUTH TWICE A DAY (Patient taking differently: Take 100 mg by mouth every other day.) 60 capsule 0   DULoxetine (CYMBALTA) 20 MG capsule Take 20 mg by mouth daily.     furosemide (LASIX) 20 MG tablet Take 20 mg by mouth daily.     levETIRAcetam (KEPPRA) 500 MG tablet Take 1 tablet (500 mg total) by mouth 2 (two) times daily. 60 tablet 1   metoprolol succinate (TOPROL-XL) 100 MG 24 hr tablet Take 1 tablet (100 mg total) by mouth daily. Take with or immediately following a meal. 30 tablet 0   Multiple Vitamin (MULTIVITAMIN WITH MINERALS) TABS tablet Take 1 tablet by mouth daily.     omeprazole (PRILOSEC OTC) 20 MG tablet Take 1 tablet (20 mg total) by mouth daily. 90 tablet 3   polyethylene glycol (MIRALAX / GLYCOLAX) 17 g  packet Take 17 g by mouth daily as needed for mild constipation. 14 each 0   tamsulosin (FLOMAX) 0.4 MG CAPS capsule Take 1 capsule (0.4 mg total) by mouth at bedtime. 30 capsule 0   Thiamine HCl (VITAMIN B-1 PO) Take 1 tablet by mouth in the morning.     ZINC OXIDE EX Apply 1 application  topically See admin instructions. Apply topically diaper area every time pt uses bathroom as a skin barrier     ferrous sulfate 325 (65 FE) MG tablet Take 1 tablet (325 mg total) by mouth daily with breakfast. (Patient not taking: Reported on 04/09/2022) 30 tablet 3  QUEtiapine (SEROQUEL) 25 MG tablet Take 0.5 tablets (12.5 mg total) by mouth at bedtime. (Patient not taking: Reported on 04/09/2022) 15 tablet 0      Home: Home Living Family/patient expects to be discharged to:: Private residence Living Arrangements: Non-relatives/Friends (has 24/7 caregivers) Available Help at Discharge: Family, Personal care attendant, Available 24 hours/day Type of Home: House Home Access: Level entry Home Layout: One level Bathroom Shower/Tub: Health visitor: Handicapped height Bathroom Accessibility: Yes Home Equipment: Agricultural consultant (2 wheels), Grab bars - tub/shower, Information systems manager - built in, Stage manager Comments: lift chair   Functional History: Prior Function Prior Level of Function : Needs assist Mobility Comments: pt was amb with RW with supervision, pt able to take self to bathroom, no driving, no cooking ADLs Comments: assist for showering, dressing  Functional Status:  Mobility: Bed Mobility Overal bed mobility: Needs Assistance Bed Mobility: Supine to Sit Supine to sit: Min assist, HOB elevated Sit to supine: Mod assist General bed mobility comments: assist for legs off bed and to elevate trunk, able to maintain sitting balance with min assist at start progressing to no assist, slight L later lean (daughter reporting baseline) Transfers Overall transfer level: Needs  assistance Equipment used: Rolling walker (2 wheels) Transfers: Sit to/from Stand, Bed to chair/wheelchair/BSC Sit to Stand: Mod assist, Min assist Bed to/from chair/wheelchair/BSC transfer type:: Step pivot Step pivot transfers: Min assist General transfer comment: mod assist to power and steady on first trial, min assist x2 more stands,contnued left lateral lean with pt able to maintain standing balance with min assist and step pivot to recliner with assist to manage RW and lines Ambulation/Gait General Gait Details: pt declining Pre-gait activities: weight shifting R/L in standing    ADL: ADL Overall ADL's : Needs assistance/impaired Eating/Feeding: NPO Grooming: Moderate assistance, Sitting Upper Body Bathing: Moderate assistance, Maximal assistance, Sitting Lower Body Bathing: Maximal assistance, Total assistance, Sitting/lateral leans, Sit to/from stand Upper Body Dressing : Moderate assistance, Sitting Lower Body Dressing: Maximal assistance, Total assistance, Sitting/lateral leans, Sit to/from stand Toilet Transfer Details (indicate cue type and reason): unable to progress into standing to date with OT Toileting- Clothing Manipulation and Hygiene: Total assistance, Bed level Toileting - Clothing Manipulation Details (indicate cue type and reason): rolling back and forth to complete peri-care  Cognition: Cognition Overall Cognitive Status: Difficult to assess Arousal/Alertness: Awake/alert Orientation Level: Oriented to person, Disoriented to time Cognition Arousal/Alertness: Awake/alert Behavior During Therapy: Flat affect Overall Cognitive Status: Difficult to assess Area of Impairment: Attention, Following commands Current Attention Level: Focused Following Commands: Follows one step commands inconsistently, Follows one step commands with increased time General Comments: pt with continued difficult with word finding, improved from yesterday, answering questions nd  following commands 75% of time Difficult to assess due to: Impaired communication  Physical Exam: Blood pressure 119/68, pulse 67, temperature 97.7 F (36.5 C), temperature source Oral, resp. rate 16, SpO2 98 %. Physical Exam Vitals and nursing note reviewed.  Constitutional:      Appearance: Normal appearance.  Pulmonary:     Breath sounds: Examination of the left-lower field reveals decreased breath sounds. Decreased breath sounds present.  Neurological:     Mental Status: He is alert.     Comments: HOH--he was able to answer simple questions and was oriented to self, age, DOB but speech tends to break down to gibberish. He is able to point and follow simple motor commands. Moves all four.      No results found for  this or any previous visit (from the past 48 hour(s)). No results found.    Blood pressure 119/68, pulse 67, temperature 97.7 F (36.5 C), temperature source Oral, resp. rate 16, SpO2 98 %.  Medical Problem List and Plan: 1. Functional deficits secondary to ***  -patient may *** shower  -ELOS/Goals: *** 2.  Antithrombotics: -DVT/anticoagulation:  Mechanical: Sequential compression devices, below knee Bilateral lower extremities  -antiplatelet therapy: N/a 3. Pain Management: Tylenol prn.  4. Mood/Behavior/Sleep: LCSW to follow for evaluation and support.  --Continue melatonin with Trazodone prn for sleep wake disruption.    -antipsychotic agents: Continue Zyprexa. Alprazolam was d/c on 11/14. 5. Neuropsych/cognition: This patient is not capable of making decisions on his own behalf. 6. Skin/Wound Care: Routine pressure relief measures.   7. Fluids/Electrolytes/Nutrition: Monitor I/O. Check CMET in am.  8. CAF: Monitor HR TID--continue to hold Eliquis.   --CT head ordered for 11/22 am  9. H/o GERD: Continue Pepcid--resume PPI. 10. HTN: Monitor BP TID--on nimotop thru 12/05. Continue Lopressor.  11. Left moderate pleural effusion/Emphysema per X rays: Flutter  valve added for pulmonary hygiene.   --may need to resume Lasix   12. Oral secretions: Continue scopolamine patch.   13. Seizure d/o: Continue Keppra bid.  14. Hemochromatosis: ABLA noted. Will monitor with serial H/H.  15. Hyponatremia: Recheck CMET in am. 16. Hypokalemia: Recheck in am.  17. Mild Aortic stenosis/Moderate TVR: Lasix d/c 11/15.  --Daily weights--BNP elevated at 600.6  on 11/16 18. Cellulitis left forearm: From infiltrated IV. Has been on Keflex since admission. Will d/c and change to doxycycline per pharmacy input.  --has been having diarrhea per family. Two formed stools documented yesterday.  --d/c colace.   ***  Bary Leriche, PA-C 04/16/2022

## 2022-04-16 NOTE — Progress Notes (Signed)
Signed     PMR Admission Coordinator Pre-Admission Assessment   Patient: Eugene [name] is an 86 y.o., male MRN: [id number] DOB: 04/06/1936 Height:   Weight:     Insurance Information HMO:     PPO:      PCP:      IPA:      80/20: yes     OTHER:  PRIMARY: Medicare A & B     Policy#: [id number]      Subscriber: patient CM Name:       Phone#:      Fax#:  Pre-Cert#:       Employer:  Benefits:  Phone #: verified eligibility via [place] on 04/13/22     Name:  Eff. Date: Part A & B effective 06/28/02     Deduct: $1,600      Out of Pocket Max: NA      Life Max: NA CIR: 100% coverage      SNF: 100% coverage for days 1-20, 80% coverage for days 21-100 Outpatient: 80% coverage     Co-Pay: 20%  Home Health: 100% coverage      Co-Pay:  DME: 80% coverage     Co-Pay: 20% Providers: pt's choice SECONDARY: AARP    Policy#: [id number]     Phone#: [phone]   Financial Counselor:       Phone#:    The "Data Collection Information Summary" for patients in Inpatient Rehabilitation Facilities with attached "Privacy Act [place] Records" was provided and verbally reviewed with: Patient and Family   Emergency Contact Information Contact Information       Name Relation Home Work Mobile    McDaniel,Stephanie Daughter     [phone]    Magdiel, Bartles     [phone]           Current Medical History  Patient Admitting Diagnosis: SDH, SAH History of Present Illness: Pt is an 86 year old male with medical hx significant for: A-fib on Eliquis, GERD, hemochromatosis, NSVT, h/o CVA, recent h/o traumatic L hemothorax requiring chest tube, CKD stage III, CHF. Pt presented to [facility] on 04/09/22 d/t fall the Wednesday prior to presentation and resulting aphasia. CT scan revealed hemorrhagic L temporal contusion with overlying SDH and SAH with mild mass effect and no midline shift. Neurosurgery did not recommend surgical intervention. EEG on 11/14 suggestive of cortical  dysfunction in left hemisphere. Also noted mild diffuse encephalopathy. No seizures seen. Pt had possible aspiration event on 11/14 when taking medication. Pt placed on 2-3L of O2 via nasal cannula. Pt also noted to have an increased amount of secretion. Scopolamine patch started. Follow up CT was stable. Therapy evaluations completed and CIR recommended d/t pt's deficits in functional mobility, inability to complete ADLs independently, and cognitive-linguistic deficits. Complete NIHSS TOTAL: 2   Patient's medical record from [facility] has been reviewed by the rehabilitation admission coordinator and physician.   Past Medical History      Past Medical History:  Diagnosis Date   AAA (abdominal aortic aneurysm) ([facility]) 06/29/2007    stent graft   Aortic stenosis 09/05/2017    Mild AS/AR by echo 01/2017.   GERD (gastroesophageal reflux disease)     Hemochromatosis     Hyperlipidemia     Hypertension     Irritable bowel syndrome     Permanent atrial fibrillation (HCC)     Stroke (HCC)      Transient Ischemic Attack      Has the patient had  major surgery during 100 days prior to admission? No   Family History   family history includes Heart disease in his father and mother; Hypertension in his mother.   Current Medications   Current Facility-Administered Medications:     stroke: early stages of recovery book, , Does not apply, Once, Wynetta Fines T, MD   acetaminophen (TYLENOL) tablet 650 mg, 650 mg, Oral, Q4H PRN, 650 mg at 04/15/22 2124 **OR** acetaminophen (TYLENOL) 160 MG/5ML solution 650 mg, 650 mg, Per Tube, Q4H PRN, 650 mg at 04/12/22 1630 **OR** acetaminophen (TYLENOL) suppository 650 mg, 650 mg, Rectal, Q4H PRN, Wynetta Fines T, MD   alum & mag hydroxide-simeth (MAALOX/MYLANTA) 200-200-20 MG/5ML suspension 30 mL, 30 mL, Oral, Q4H PRN, Bonnielee Haff, MD   cephALEXin (KEFLEX) capsule 500 mg, 500 mg, Oral, BID, [place], Ping T, MD, 500 mg at 04/16/22 1022   diclofenac Sodium  (VOLTAREN) 1 % topical gel 2 g, 2 g, Topical, QID PRN, Wynetta Fines T, MD, 2 g at 04/12/22 1652   docusate (COLACE) 50 MG/5ML liquid 100 mg, 100 mg, Oral, Nash Mantis, Nicki [place], MD, 100 mg at 04/15/22 0825   famotidine (PEPCID) tablet 20 mg, 20 mg, Oral, BID, Bonnielee Haff, MD, 20 mg at 04/16/22 1022   hydrALAZINE (APRESOLINE) injection 5 mg, 5 mg, Intravenous, Q6H PRN, Wynetta Fines T, MD, 5 mg at 04/10/22 1122   levETIRAcetam (KEPPRA) IVPB 500 mg/100 mL premix, 500 mg, Intravenous, Q12H, Godfrey Pick, MD, Last Rate: 400 mL/hr at 04/16/22 0029, 500 mg at 04/16/22 0029   melatonin tablet 5 mg, 5 mg, Oral, QHS, Rai, Ripudeep K, MD, 5 mg at 04/15/22 2124   metoprolol tartrate (LOPRESSOR) injection 2.5 mg, 2.5 mg, Intravenous, Q6H PRN, Bonnielee Haff, MD   metoprolol tartrate (LOPRESSOR) tablet 50 mg, 50 mg, Oral, BID, Bonnielee Haff, MD, 50 mg at 04/16/22 1022   niMODipine (NYMALIZE) 6 MG/ML oral solution 60 mg, 60 mg, Oral, Q4H, Bonnielee Haff, MD, 60 mg at 04/16/22 0830   OLANZapine (ZYPREXA) tablet 2.5 mg, 2.5 mg, Oral, QHS, Rai, Ripudeep K, MD, 2.5 mg at 04/15/22 2124   Oral care mouth rinse, 15 mL, Mouth Rinse, 4 times per day, Rai, Ripudeep K, MD, 15 mL at 04/16/22 0830   Oral care mouth rinse, 15 mL, Mouth Rinse, PRN, Rai, Ripudeep K, MD   polyethylene glycol (MIRALAX / GLYCOLAX) packet 17 g, 17 g, Oral, Daily PRN, Bonnielee Haff, MD   scopolamine (TRANSDERM-SCOP) 1 MG/3DAYS 1.5 mg, 1 patch, Transdermal, Q72H, Bonnielee Haff, MD, 1.5 mg at 04/13/22 1320   tamsulosin (FLOMAX) capsule 0.4 mg, 0.4 mg, Oral, QHS, Bonnielee Haff, MD, 0.4 mg at 04/15/22 2124   Facility-Administered Medications Ordered in Other Encounters:    alteplase (CATHFLO ACTIVASE) injection 2 mg, 2 mg, Intracatheter, Once PRN, Magrinat, Virgie Dad, MD   sodium chloride 0.9 % injection 10 mL, 10 mL, Intravenous, PRN, Magrinat, Virgie Dad, MD, 10 mL at 11/25/14 1220   Patients Current Diet:  Diet Order                   Diet regular Room service appropriate? Yes; Fluid consistency: Thin  Diet effective now                         Precautions / Restrictions Precautions Precautions: Fall Restrictions Weight Bearing Restrictions: No    Has the patient had 2 or more falls or a fall with injury in the past year? Yes  Prior Activity Level Limited Community (1-2x/wk): gets out of house for medical appointments   Prior Functional Level Self Care: Did the patient need help bathing, dressing, using the toilet or eating? Needed some help   Indoor Mobility: Did the patient need assistance with walking from room to room (with or without device)? Independent   Stairs: Did the patient need assistance with internal or external stairs (with or without device)? Pt reports avoiding stairs   Functional Cognition: Did the patient need help planning regular tasks such as shopping or remembering to take medications? Needed some help   Patient Information Are you of Hispanic, Latino/a,or Spanish origin?: A. No, not of Hispanic, Latino/a, or Spanish origin What is your race?: A. White Do you need or want an interpreter to communicate with a doctor or health care staff?: 0. No   Patient's Response To:  Health Literacy and Transportation Is the patient able to respond to health literacy and transportation needs?: Yes Health Literacy - How often do you need to have someone help you when you read instructions, pamphlets, or other written material from your doctor or pharmacy?: Never In the past 12 months, has lack of transportation kept you from medical appointments or from getting medications?: No In the past 12 months, has lack of transportation kept you from meetings, work, or from getting things needed for daily living?: No   Development worker, international aid / [place] Devices/Equipment: None Home Equipment: Conservation officer, nature (2 wheels), Grab bars - tub/shower, Civil engineer, contracting - built in, Geneticist, molecular   Prior  Device Use: Indicate devices/aids used by the patient prior to current illness, exacerbation or injury? Manual wheelchair and Walker   Current Functional Level Cognition   Arousal/Alertness: Awake/alert Overall Cognitive Status: Difficult to assess Difficult to assess due to: Impaired communication Current Attention Level: Focused Orientation Level: Oriented to person, Disoriented to time Following Commands: Follows one step commands inconsistently, Follows one step commands with increased time General Comments: pt with continued difficult with word finding, improved from yesterday, answering questions nd following commands 75% of time    Extremity Assessment (includes Sensation/Coordination)   Upper Extremity Assessment: Generalized weakness, LUE deficits/detail LUE Deficits / Details: son reports L shoulder pain at baseline LUE: Unable to fully assess due to pain  Lower Extremity Assessment: Defer to PT evaluation     ADLs   Overall ADL's : Needs assistance/impaired Eating/Feeding: NPO Grooming: Moderate assistance, Sitting Upper Body Bathing: Moderate assistance, Maximal assistance, Sitting Lower Body Bathing: Maximal assistance, Total assistance, Sitting/lateral leans, Sit to/from stand Upper Body Dressing : Moderate assistance, Sitting Lower Body Dressing: Maximal assistance, Total assistance, Sitting/lateral leans, Sit to/from stand Toilet Transfer Details (indicate cue type and reason): unable to progress into standing to date with OT Toileting- Clothing Manipulation and Hygiene: Total assistance, Bed level Toileting - Clothing Manipulation Details (indicate cue type and reason): rolling back and forth to complete peri-care     Mobility   Overal bed mobility: Needs Assistance Bed Mobility: Supine to Sit Supine to sit: Min assist, HOB elevated Sit to supine: Mod assist General bed mobility comments: assist for legs off bed and to elevate trunk, able to maintain sitting  balance with min assist at start progressing to no assist, slight L later lean (daughter reporting baseline)     Transfers   Overall transfer level: Needs assistance Equipment used: Rolling walker (2 wheels) Transfers: Sit to/from Stand, Bed to chair/wheelchair/BSC Sit to Stand: Mod assist, Min assist Bed to/from chair/wheelchair/BSC transfer type::  Step pivot Step pivot transfers: Min assist General transfer comment: mod assist to power and steady on first trial, min assist x2 more stands,contnued left lateral lean with pt able to maintain standing balance with min assist and step pivot to recliner with assist to manage RW and lines     Ambulation / Gait / Stairs / Wheelchair Mobility   Ambulation/Gait General Gait Details: pt declining Pre-gait activities: weight shifting R/L in standing     Posture / Balance Dynamic Sitting Balance Sitting balance - Comments: requring external assist to maintain sitting balance Balance Overall balance assessment: Needs assistance Sitting-balance support: Feet supported Sitting balance-Leahy Scale: Poor Sitting balance - Comments: requring external assist to maintain sitting balance Postural control: Left lateral lean, Posterior lean Standing balance support: Bilateral upper extremity supported Standing balance-Leahy Scale: Poor Standing balance comment: heavy reliance on RW     Special needs/care consideration Skin Pressure injury: coccyx/medial, Bowel and Bladder incontinence    Previous Home Environment (from acute therapy documentation) Living Arrangements: Non-relatives/Friends (has 24/7 caregivers) Available Help at Discharge: Family, Personal care attendant, Available 24 hours/day Type of Home: House Home Layout: One level Home Access: Level entry Bathroom Shower/Tub: Multimedia programmer: Handicapped height Bathroom Accessibility: Yes How Accessible: Accessible via wheelchair, Accessible via walker Home Care Services:  Yes Type of Home Care Services: [place] (if known): [facility] Additional Comments: lift chair   Discharge Living Setting Plans for Discharge Living Setting: Patient's home Type of Home at Discharge: House Discharge Home Layout: One level Discharge Home Access: Level entry Discharge Bathroom Shower/Tub: Walk-in shower Discharge Bathroom Toilet: Handicapped height Discharge Bathroom Accessibility: Yes How Accessible: Accessible via wheelchair, Accessible via walker Does the patient have any problems obtaining your medications?: No   Social/Family/Support Systems Anticipated Caregiver: family and hired caregivers Anticipated Caregiver's Contact Information: Dario Guardian, daughter: [phone] Caregiver Availability: 24/7 Discharge Plan Discussed with Primary Caregiver: Yes Is Caregiver In Agreement with Plan?: Yes Does Caregiver/Family have Issues with Lodging/Transportation while Pt is in Rehab?: No   Goals Patient/Family Goal for Rehab: Supervision-Min A: PT/OT/ST Expected length of stay: 12-14 days Pt/Family Agrees to Admission and willing to participate: Yes Program Orientation Provided & Reviewed with Pt/Caregiver Including Roles  & Responsibilities: Yes   Decrease burden of Care through IP rehab admission: NA   Possible need for SNF placement upon discharge: Not anticipated   Patient Condition: I have reviewed medical records from [facility], spoken with CM, and patient and daughter. I met with patient at the bedside for inpatient rehabilitation assessment.  Patient will benefit from ongoing PT, OT, and SLP, can actively participate in 3 hours of therapy a day 5 days of the week, and can make measurable gains during the admission.  Patient will also benefit from the coordinated team approach during an Inpatient Acute Rehabilitation admission.  The patient will receive intensive therapy as well as Rehabilitation physician, nursing, social worker,  and care management interventions.  Due to bladder management, bowel management, safety, skin/wound care, disease management, medication administration, pain management, and patient education the patient requires 24 hour a day rehabilitation nursing.  The patient is currently Min-Mod A with mobility and Mod-Total A with basic ADLs.  Discharge setting and therapy post discharge at home with home health is anticipated.  Patient has agreed to participate in the Acute Inpatient Rehabilitation Program and will admit today.   Preadmission Screen Completed By:  Bethel Born, 04/16/2022 10:31 AM ______________________________________________________________________   Discussed  status with Dr. Letta Pate on 04/16/22  at 10:31 AM and received approval for admission today.   Admission Coordinator:  Bethel Born, CCC-SLP, time 10:31 AM/Date 04/16/22     Assessment/Plan: Diagnosis:Left temporal contusion  Does the need for close, 24 hr/day Medical supervision in concert with the patient's rehab needs make it unreasonable for this patient to be served in a less intensive setting? Yes Co-Morbidities requiring supervision/potential complications: Left SDH, SAH, hx seizure d/o,aphasia Due to bladder management, bowel management, safety, skin/wound care, disease management, medication administration, pain management, and patient education, does the patient require 24 hr/day rehab nursing? Yes Does the patient require coordinated care of a physician, rehab nurse, PT, OT, and SLP to address physical and functional deficits in the context of the above medical diagnosis(es)? Yes Addressing deficits in the following areas: balance, endurance, locomotion, strength, transferring, bowel/bladder control, bathing, dressing, toileting, cognition, speech, language, swallowing, and psychosocial support Can the patient actively participate in an intensive therapy program of at least 3 hrs of therapy 5 days a  week? Yes The potential for patient to make measurable gains while on inpatient rehab is fair Anticipated functional outcomes upon discharge from inpatient rehab: supervision and min assist PT, supervision and min assist OT, supervision and min assist SLP Estimated rehab length of stay to reach the above functional goals is: 12-14d Anticipated discharge destination:  home with family and hired caregivers 10. Overall Rehab/Functional Prognosis: fair     MD Signature: Charlett Blake M.D. [facility] Fellow Am Acad of Phys Med and Rehab Diplomate Am Board of Electrodiagnostic Med Fellow Am Board of Interventional Pain

## 2022-04-16 NOTE — H&P (Signed)
Physical Medicine and Rehabilitation Admission H&P     CC: Functional deficits due to TBI     HPI:  Eugene [name] is an 86 year old male with history of AS, NSVT/A fib- on Eliquis, CKD, hemochromatosis, ETOH abuse,  known to CIR from stay 12/2021 after fall with rib/Fx and L-HPTX who was admitted on 04/09/22 after fall on a week PTA on 04/09/22. Per reports, he started developing difficulty taking 2-3 days PTA progressing to inability to speak. CT head done revealing 1.8 cm hemorrhagic contusion in left anterior temporal lobe as well as overlying SDH/SAH with mild mass effect and moderate left pleural effusion noted.  CT C-spine showed mild multilevel degenerative changes with facet arthropathy and nos high grade spinal stenosis. CXR showed no change in left pleural effusion c/w 10/04 films.  Dr. Yetta Barre evaluated patient and felt that no surgical intervention needed and to resume anticoagulation after a week with repeat CT head.  Follow up CT head showed no change in hemorrhagic contusion or mass effect.    EEG done showed mild diffuse encephalopathy without seizures or epileptiform discharges.  He was maintained on Keppra for  7 days for seizure prophylaxis--no history of seizure documented in PMHx. Speech therapy evaluation revealed severe expressive and moderate receptive aphasia with poor frustration tolerance and no attempts to communicate verbally or nonverbally.  He did have decline in respiratory status requiring 4 L O2 per Arnold while in ED and scopolamine patch added due to copious oral secretions. Per NS note 11/15-->to start oral AC in 7-10 days and repeat CT head in 7-10 days. Therapy has been working with patient who continues to be limited by balance deficits, strong left lateral lean with fatigue, expressive/receptive deficits with delay in processing and difficulty following one step commands consistently. CIR recommended due to functional decline.      ROS- could not obtain due to mental  status         Past Medical History:  Diagnosis Date   AAA (abdominal aortic aneurysm) (HCC) 06/29/2007    stent graft   Aortic stenosis 09/05/2017    Mild AS/AR by echo 01/2017.   GERD (gastroesophageal reflux disease)     Hemochromatosis     Hyperlipidemia     Hypertension     Irritable bowel syndrome     Permanent atrial fibrillation (HCC)     Stroke (HCC)      Transient Ischemic Attack           Past Surgical History:  Procedure Laterality Date   ABDOMINAL AORTIC ANEURYSM REPAIR       ENDOVASCULAR STENT INSERTION   07/18/2007    saccular infrarenal aortic aneurysm   EYE SURGERY Bilateral      Cataract     IR FLUORO GUIDE CV LINE LEFT   12/08/2018   IR FLUORO GUIDE CV LINE RIGHT   02/13/2017   IR FLUORO GUIDE CV LINE RIGHT   02/25/2017   IR US GUIDE VASC ACCESS RIGHT   02/13/2017   PICC LINE PLACE PERIPHERAL ([facility] HX)   Oct 21, 2014           Family History  Problem Relation Age of Onset   Hypertension Mother     Heart disease Mother          AAA, he passed in his 54 s of Heart Diseasse   Heart disease Father          AAA history and he passed at age 71  of   Disease      Social History:  reports that he quit smoking about 22 years ago. His smoking use included cigarettes. He has a 40.00 pack-year smoking history. He has never used smokeless tobacco. He reports current alcohol use of about 1.0 - 2.0 standard drink of alcohol per week. He reports that he does not use drugs.          Allergies  Allergen Reactions   Chlorhexidine Gluconate Itching      Don't use Chloraprep inside port a cath kits.   Macrolides And Ketolides Nausea And Vomiting   Tape Other (See Comments)      Adhesive tape,   Only use paper tape            Medications Prior to Admission  Medication Sig Dispense Refill   acetaminophen (TYLENOL) 500 MG tablet Take 500 mg by mouth every 6 (six) hours as needed for moderate pain.       ALPRAZolam (XANAX) 1 MG tablet Take 0.5 tablets (0.5 mg total) by  mouth 2 (two) times daily. For anxiety 14 tablet 0   apixaban (ELIQUIS) 5 MG TABS tablet Take 1 tablet (5 mg total) by mouth 2 (two) times daily. 60 tablet 0   Ascorbic Acid (VITAMIN C PO) Take 1 tablet by mouth in the morning.       atorvastatin (LIPITOR) 20 MG tablet Take 1 tablet (20 mg total) by mouth daily. 90 tablet 3   cephALEXin (KEFLEX) 500 MG capsule Take 500 mg by mouth 2 (two) times daily.       Cholecalciferol (VITAMIN D-3 PO) Take 1 capsule by mouth in the morning.       diclofenac Sodium (VOLTAREN) 1 % GEL Apply 2 g topically 4 (four) times daily as needed (left shoulder pain). 350 g 1   docusate sodium (COLACE) 100 MG capsule TAKE 1 CAPSULE BY MOUTH TWICE A DAY (Patient taking differently: Take 100 mg by mouth every other day.) 60 capsule 0   DULoxetine (CYMBALTA) 20 MG capsule Take 20 mg by mouth daily.       furosemide (LASIX) 20 MG tablet Take 20 mg by mouth daily.       levETIRAcetam (KEPPRA) 500 MG tablet Take 1 tablet (500 mg total) by mouth 2 (two) times daily. 60 tablet 1   metoprolol succinate (TOPROL-XL) 100 MG 24 hr tablet Take 1 tablet (100 mg total) by mouth daily. Take with or immediately following a meal. 30 tablet 0   Multiple Vitamin (MULTIVITAMIN WITH MINERALS) TABS tablet Take 1 tablet by mouth daily.       omeprazole (PRILOSEC OTC) 20 MG tablet Take 1 tablet (20 mg total) by mouth daily. 90 tablet 3   polyethylene glycol (MIRALAX / GLYCOLAX) 17 g packet Take 17 g by mouth daily as needed for mild constipation. 14 each 0   tamsulosin (FLOMAX) 0.4 MG CAPS capsule Take 1 capsule (0.4 mg total) by mouth at bedtime. 30 capsule 0   Thiamine HCl (VITAMIN B-1 PO) Take 1 tablet by mouth in the morning.       ZINC OXIDE EX Apply 1 application  topically See admin instructions. Apply topically diaper area every time pt uses bathroom as a skin barrier       ferrous sulfate 325 (65 FE) MG tablet Take 1 tablet (325 mg total) by mouth daily with breakfast. (Patient not taking:  Reported on 04/09/2022) 30 tablet 3   QUEtiapine (SEROQUEL) 25 MG tablet Take  0.5 tablets (12.5 mg total) by mouth at bedtime. (Patient not taking: Reported on 04/09/2022) 15 tablet 0          Home: Home Living Family/patient expects to be discharged to:: Private residence Living Arrangements: Non-relatives/Friends (has 24/7 caregivers) Available Help at Discharge: Family, Personal care attendant, Available 24 hours/day Type of Home: House Home Access: Level entry Home Layout: One level Bathroom Shower/Tub: Health visitorWalk-in shower Bathroom Toilet: Handicapped height Bathroom Accessibility: Yes Home Equipment: Agricultural consultantolling Walker (2 wheels), Grab bars - tub/shower, Information systems managerhower seat - built in, Stage managerToilet riser Additional Comments: lift chair   Functional History: Prior Function Prior Level of Function : Needs assist Mobility Comments: pt was amb with RW with supervision, pt able to take self to bathroom, no driving, no cooking ADLs Comments: assist for showering, dressing   Functional Status:  Mobility: Bed Mobility Overal bed mobility: Needs Assistance Bed Mobility: Supine to Sit Supine to sit: Min assist, HOB elevated Sit to supine: Mod assist General bed mobility comments: assist for legs off bed and to elevate trunk, able to maintain sitting balance with min assist at start progressing to no assist, slight L later lean (daughter reporting baseline) Transfers Overall transfer level: Needs assistance Equipment used: Rolling walker (2 wheels) Transfers: Sit to/from Stand, Bed to chair/wheelchair/BSC Sit to Stand: Mod assist, Min assist Bed to/from chair/wheelchair/BSC transfer type:: Step pivot Step pivot transfers: Min assist General transfer comment: mod assist to power and steady on first trial, min assist x2 more stands,contnued left lateral lean with pt able to maintain standing balance with min assist and step pivot to recliner with assist to manage RW and lines Ambulation/Gait General Gait  Details: pt declining Pre-gait activities: weight shifting R/L in standing   ADL: ADL Overall ADL's : Needs assistance/impaired Eating/Feeding: NPO Grooming: Moderate assistance, Sitting Upper Body Bathing: Moderate assistance, Maximal assistance, Sitting Lower Body Bathing: Maximal assistance, Total assistance, Sitting/lateral leans, Sit to/from stand Upper Body Dressing : Moderate assistance, Sitting Lower Body Dressing: Maximal assistance, Total assistance, Sitting/lateral leans, Sit to/from stand Toilet Transfer Details (indicate cue type and reason): unable to progress into standing to date with OT Toileting- Clothing Manipulation and Hygiene: Total assistance, Bed level Toileting - Clothing Manipulation Details (indicate cue type and reason): rolling back and forth to complete peri-care   Cognition: Cognition Overall Cognitive Status: Difficult to assess Arousal/Alertness: Awake/alert Orientation Level: Oriented to person, Disoriented to time Cognition Arousal/Alertness: Awake/alert Behavior During Therapy: Flat affect Overall Cognitive Status: Difficult to assess Area of Impairment: Attention, Following commands Current Attention Level: Focused Following Commands: Follows one step commands inconsistently, Follows one step commands with increased time General Comments: pt with continued difficult with word finding, improved from yesterday, answering questions nd following commands 75% of time Difficult to assess due to: Impaired communication   Physical Exam: Blood pressure 119/68, pulse 67, temperature 97.7 F (36.5 C), temperature source Oral, resp. rate 16, SpO2 98 %. Physical Exam  Aphasia +/- HOH General: No acute distress Mood and affect are appropriate Heart: Regular rate and rhythm no rubs murmurs or extra sounds Lungs: Clear to auscultation, breathing unlabored, no rales or wheezes Abdomen: Positive bowel sounds, soft nontender to palpation,  nondistended Extremities: No clubbing, cyanosis, or edema Skin: No evidence of breakdown, no evidence of rash Neurologic: Cranial nerves II through XII intact, motor strength is 5/5 in bilateral deltoid, bicep, tricep, grip, hip flexor, knee extensors, ankle dorsiflexor and plantar flexor Sensory exam could not perform due to aphasia  Cerebellar exam normal  finger to nose to finger Musculoskeletal: Full range of motion in all 4 extremities. No joint swelling  Lab Results Last 48 Hours  No results found for this or any previous visit (from the past 48 hour(s)).   Imaging Results (Last 48 hours)  No results found.         Blood pressure 119/68, pulse 67, temperature 97.7 F (36.5 C), temperature source Oral, resp. rate 16, SpO2 98 %.   Medical Problem List and Plan: 1. Functional deficits secondary to Left temporal contusion             -patient may  shower             -ELOS/Goals: 12-14d, minA/Sup  2.  Antithrombotics: -DVT/anticoagulation:  Mechanical: Sequential compression devices, below knee Bilateral lower extremities             -antiplatelet therapy: N/a 3. Pain Management: Tylenol prn.  4. Mood/Behavior/Sleep: LCSW to follow for evaluation and support.             --Continue melatonin with Trazodone prn for insomnia.             -antipsychotic agents: Continue Zyprexa.  5. Neuropsych/cognition: This patient is not capable of making decisions on his own behalf. 6. Skin/Wound Care: Routine pressure relief measures.  7. Fluids/Electrolytes/Nutrition: Monitor I/O. Check CMET in am.  8. CAF: Monitor HR TID--continue to hold Eliquis.              --CT head ordered for 11/22 am  9. H/o GERD: Continue Pepcid--resume PPI. 10. HTN: Monitor BP TID--on nimotop thru 12/05. Continue Lopressor.  11. Left moderate pleural effusion/Emphysema per X rays: Flutter valve added for pulmonary hygiene.  12. Oral secretions: Continue scopolamine patch.   13. Seizure Hx?: Keppra was d/c today  after 7d prophyllaxis following head trauma 14. Hx AAA, control BP       Jacquelynn Cree, [place] 04/16/2022  Erick Colace M.D. [facility] Fellow Am Acad of Phys Med and Rehab Diplomate Am Board of Electrodiagnostic Med Fellow Am Board of Interventional Pain

## 2022-04-16 NOTE — Progress Notes (Signed)
Inpatient Rehab Admissions Coordinator:  There is a bed available in CIR for pt today. Dr. Isidoro Donning aware and in agreement. Pt, pt's daughter [name], son Asher Muir, NSG, and TOC made aware.   Wolfgang Phoenix, MS, CCC-SLP Admissions Coordinator [phone]

## 2022-04-16 NOTE — Progress Notes (Addendum)
Noted stage 2; measuring 2cm x.6cm on patient's right buttocks; surrounding area noted dark reddish and purplish in color. Measurement done with Keturah Barre RN. Attempted to get a picture but patient refused. Pam PA aware.

## 2022-04-16 NOTE — Progress Notes (Addendum)
Speech Language Pathology Treatment: Dysphagia  Patient Details Name: Eugene [name] MRN: [id number] DOB: [birth year] Today's Date: 04/16/2022 Time: 8337-4451 SLP Time Calculation (min) (ACUTE ONLY): 16 min  Assessment / Plan / Recommendation Clinical Impression  Pt seen for brief dysphagia tx with mod I during intake given min verbal cues for esophageal precautions including small bites/sips, liquid wash prn, repetitive swallows to clear pharynx and consume smaller meals more frequently with upright positioning during and after meals d/t GERD hx.  Pt consumed regular/thin liquid snack without overt s/s of aspiration noted during intake.  Pt's cognition improved since last session with questions answered more readily and swallow function improved overall with current diet recommendation of regular/thin liquids provided by small sips/straw.  Pt kindly declined more food/liquids d/t recent completion of lunch tray.  Pt agreeable to ST f/u for aspiration/esophageal precautions during meal at next level of care to promote carry-over of swallowing precautions/least restrictive diet.  Continue Regular/thin liquids with implementation of swallowing guidelines/safety precautions.     HPI HPI: Patient is an 86 y.o. male with PMH: chronic a-fib, seizure disorder, HTN, HLD, chronic ambulation dysfunction with frequent falls, AAA s/p repair, recent UTI, CVA. He was admitted in August of 2023 following fall onto side of bathtub and ultimately was evaluated and treated by SLP for dysphagia (appeared to be acute on chronic). CT Head showed 1.8 cm hemorrhagic contusion of left temporal lobe as well as overlying SDH and SAH, mild mass effect on the underlying brain parenchyma but no midline shift. He failed Yale swallow with RN and was made NPO. BSE completed on 04/10/22 and pt placed on a Dysphagia 2/thin diet and downgraded on 04/11/22 to a full liquid diet; progressed back to Regular/thin liquids on 04/13/22 per ST  recommendation. ST f/u for dysphagia tx/education.      SLP Plan  Continue with current plan of care      Recommendations for follow up therapy are one component of a multi-disciplinary discharge planning process, led by the attending physician.  Recommendations may be updated based on patient status, additional functional criteria and insurance authorization.    Recommendations  Diet recommendations: Regular;Thin liquid Liquids provided via: Straw;Cup Medication Administration: Whole meds with puree Supervision: Patient able to self feed;Full supervision/cueing for compensatory strategies Compensations: Small sips/bites;Slow rate;Multiple dry swallows after each bite/sip;Follow solids with liquid Postural Changes and/or Swallow Maneuvers: Seated upright 90 degrees;Upright 30-60 min after meal                Oral Care Recommendations: Oral care BID Follow Up Recommendations: Follow physician's recommendations for discharge plan and follow up therapies Assistance recommended at discharge: Frequent or constant Supervision/Assistance SLP Visit Diagnosis: Dysphagia, pharyngoesophageal phase (R13.14) Plan: Continue with current plan of care           Pat Stephinie Battisti,M.S., CCC-SLP 04/16/2022, 2:19 PM

## 2022-04-16 NOTE — TOC Transition Note (Signed)
Transition of Care [facility]) - CM/SW Discharge Note   Patient Details  Name: Eugene [name] MRN: [id number] Date of Birth: Oct 14, 1935  [facility] CM/SW Contact:  Kermit Balo, RN Phone Number: 04/16/2022, 11:52 AM   Clinical Narrative:    Pt is discharging to CIR. TOC signing off.    Final next level of care: IP Rehab Facility Barriers to Discharge: No Barriers Identified   Patient Goals and CMS Choice   CMS Medicare.gov Compare Post Acute Care list provided to:: Patient Choice offered to / list presented to : Patient  Discharge Placement                       Discharge Plan and Services     Post Acute Care Choice: IP Rehab                               Social Determinants of Health (SDOH) Interventions     Readmission Risk Interventions     No data to display

## 2022-04-17 DIAGNOSIS — G8929 Other chronic pain: Secondary | ICD-10-CM

## 2022-04-17 DIAGNOSIS — I10 Essential (primary) hypertension: Secondary | ICD-10-CM | POA: Diagnosis not present

## 2022-04-17 DIAGNOSIS — Z96612 Presence of left artificial shoulder joint: Secondary | ICD-10-CM | POA: Diagnosis not present

## 2022-04-17 DIAGNOSIS — S069X1S Unspecified intracranial injury with loss of consciousness of 30 minutes or less, sequela: Secondary | ICD-10-CM | POA: Diagnosis not present

## 2022-04-17 DIAGNOSIS — M25512 Pain in left shoulder: Secondary | ICD-10-CM

## 2022-04-17 DIAGNOSIS — I482 Chronic atrial fibrillation, unspecified: Secondary | ICD-10-CM

## 2022-04-17 LAB — CBC WITH DIFFERENTIAL/PLATELET
Abs Immature Granulocytes: 0.01 10*3/uL (ref 0.00–0.07)
Basophils Absolute: 0 10*3/uL (ref 0.0–0.1)
Basophils Relative: 1 %
Eosinophils Absolute: 0.4 10*3/uL (ref 0.0–0.5)
Eosinophils Relative: 6 %
HCT: 36.2 % — ABNORMAL LOW (ref 39.0–52.0)
Hemoglobin: 12.6 g/dL — ABNORMAL LOW (ref 13.0–17.0)
Immature Granulocytes: 0 %
Lymphocytes Relative: 18 %
Lymphs Abs: 1.1 10*3/uL (ref 0.7–4.0)
MCH: 31.3 pg (ref 26.0–34.0)
MCHC: 34.8 g/dL (ref 30.0–36.0)
MCV: 89.8 fL (ref 80.0–100.0)
Monocytes Absolute: 0.5 10*3/uL (ref 0.1–1.0)
Monocytes Relative: 8 %
Neutro Abs: 4.1 10*3/uL (ref 1.7–7.7)
Neutrophils Relative %: 67 %
Platelets: 269 10*3/uL (ref 150–400)
RBC: 4.03 MIL/uL — ABNORMAL LOW (ref 4.22–5.81)
RDW: 14.4 % (ref 11.5–15.5)
WBC: 6.2 10*3/uL (ref 4.0–10.5)
nRBC: 0 % (ref 0.0–0.2)

## 2022-04-17 LAB — COMPREHENSIVE METABOLIC PANEL
ALT: 11 U/L (ref 0–44)
AST: 13 U/L — ABNORMAL LOW (ref 15–41)
Albumin: 3 g/dL — ABNORMAL LOW (ref 3.5–5.0)
Alkaline Phosphatase: 64 U/L (ref 38–126)
Anion gap: 12 (ref 5–15)
BUN: 14 mg/dL (ref 8–23)
CO2: 22 mmol/L (ref 22–32)
Calcium: 9 mg/dL (ref 8.9–10.3)
Chloride: 102 mmol/L (ref 98–111)
Creatinine, Ser: 1.04 mg/dL (ref 0.61–1.24)
GFR, Estimated: >60 mL/min (ref >60)
Glucose, Bld: 97 mg/dL (ref 70–99)
Potassium: 3.5 mmol/L (ref 3.5–5.1)
Sodium: 136 mmol/L (ref 135–145)
Total Bilirubin: 0.7 mg/dL (ref 0.3–1.2)
Total Protein: 6 g/dL — ABNORMAL LOW (ref 6.5–8.1)

## 2022-04-17 MED ORDER — ACETAMINOPHEN 325 MG PO TABS
650.0000 mg | ORAL_TABLET | Freq: Four times a day (QID) | ORAL | Status: DC
  Administered 2022-04-17 – 2022-05-10 (×80): 650 mg via ORAL
  Filled 2022-04-17 (×86): qty 2

## 2022-04-17 NOTE — Care Management (Signed)
Inpatient Rehabilitation Center Individual Statement of Services  Patient Name:  Eugene [name]  Date:  04/17/2022  Welcome to the Inpatient Rehabilitation Center.  Our goal is to provide you with an individualized program based on your diagnosis and situation, designed to meet your specific needs.  With this comprehensive rehabilitation program, you will be expected to participate in at least 3 hours of rehabilitation therapies Monday-Friday, with modified therapy programming on the weekends.  Your rehabilitation program will include the following services:  Physical Therapy (PT), Occupational Therapy (OT), Speech Therapy (ST), 24 hour per day rehabilitation nursing, Therapeutic Recreaction (TR), Psychology, Neuropsychology, Care Coordinator, Rehabilitation Medicine, Nutrition Services, Pharmacy Services, and Other  Weekly team conferences will be held on Tuesdays to discuss your progress.  Your Inpatient Rehabilitation Care Coordinator will talk with you frequently to get your input and to update you on team discussions.  Team conferences with you and your family in attendance may also be held.  Expected length of stay: 12-16 days    Overall anticipated outcome: Minimal Assistance  Depending on your progress and recovery, your program may change. Your Inpatient Rehabilitation Care Coordinator will coordinate services and will keep you informed of any changes. Your Inpatient Rehabilitation Care Coordinator's name and contact numbers are listed  below.  The following services may also be recommended but are not provided by the Inpatient Rehabilitation Center:  Driving Evaluations Home Health Rehabiltiation Services Outpatient Rehabilitation Services Vocational Rehabilitation   Arrangements will be made to provide these services after discharge if needed.  Arrangements include referral to agencies that provide these services.  Your insurance has been verified to be:  Medicare A/B  Your  primary doctor is:  Harmon Pier  Pertinent information will be shared with your doctor and your insurance company.  Inpatient Rehabilitation Care Coordinator:  Susie Cassette [phone] or ([phone]  Information discussed with and copy given to patient by: Gretchen Short, 04/17/2022, 4:48 PM

## 2022-04-17 NOTE — Progress Notes (Signed)
Inpatient Rehabilitation Admission Medication Review by a Pharmacist  A complete drug regimen review was completed for this patient to identify any potential clinically significant medication issues.  High Risk Drug Classes Is patient taking? Indication by Medication  Antipsychotic Yes Olanzapine-acute metabolic encephalopathy Compazine-Nausea  Anticoagulant No   Antibiotic Yes Doxycycline-Cellulitis  Opioid No   Antiplatelet No   Hypoglycemics/insulin No   Vasoactive Medication Yes Metoprolol-HTN Nimodipine-SAH Tamsulosin-BPH  Chemotherapy No   Other Yes Diclofenac-pain Docusate-constipation Famotidine-GERD Omeprazole-GERD Miralax-constipation Scopalamine-secretions Trazadone-sleep Keppra-h/o seizures     Type of Medication Issue Identified Description of Issue Recommendation(s)  Drug Interaction(s) (clinically significant)     Duplicate Therapy     Allergy     No Medication Administration End Date     Incorrect Dose     Additional Drug Therapy Needed     Significant med changes from prior encounter (inform family/care partners about these prior to discharge). Patient on apixaban PTA for [facility]. Ok to restart in 7-10 days per discharge summary F/u restart  of apixaban at 7-10 day mark (11/20 vs 11/27-11/30)  Other       Clinically significant medication issues were identified that warrant physician communication and completion of prescribed/recommended actions by midnight of the next day:  No  Name of provider notified for urgent issues identified:   Provider Method of Notification:     Pharmacist comments:   Time spent performing this drug regimen review (minutes):  20   Coretha Creswell A. Jeanella Craze, PharmD, BCPS, FNKF Clinical Pharmacist [place] Please utilize Amion for appropriate phone number to reach the unit pharmacist [facility]) Kai Levins 04/16/2022 12:42 PM

## 2022-04-17 NOTE — Patient Care Conference (Signed)
Inpatient RehabilitationTeam Conference and Plan of Care Update Date: 04/17/2022   Time:09:5 AM    Patient Name: Eugene [name]      Medical Record Number: [id number] Date of Birth: 1935-11-02 Sex: Male         Room/Bed: 4M03C/4M03C-01 Payor Info: Payor: MEDICARE / Plan: MEDICARE PART A AND B / Product Type: *No Product type* /    Admit Date/Time:  04/16/2022  4:41 PM  Primary Diagnosis:  TBI (traumatic brain injury) [facility])  Hospital Problems: Principal Problem:   TBI (traumatic brain injury) [facility])    Expected Discharge Date: Expected Discharge Date:  (ELOS 2 weeks)  Team Members Present: Physician leading conference: Dr. Faith Rogue Social Worker Present: Cecile Sheerer, LCSWA Nurse Present: Chana Bode, RN PT Present: Serina Cowper, PT OT Present: Blanch Media, OT SLP Present: Feliberto Gottron, SLP PPS Coordinator present : Fae Pippin, SLP     Current Status/Progress Goal Weekly Team Focus  Bowel/Bladder   (P) Patient is continent of bladder and bowel, LBM   (P) Maintain continency   Assess QS/PRN    Swallow/Nutrition/ Hydration   Eval Pending           ADL's   MOD A ADLs and mobility from lower surfaces, poor receptive communication (?v hard of hearing), better reading receptive communication   Supervision-MIN A   balnace, endurance, awareness, cognition, ADL retraining, AE education, AAROM of UE    Mobility   Eval pending, mod A transfers on acute   TBD  Evaluation, functional assessment, initiate apropriate interventions    Communication   Eval Pending            Safety/Cognition/ Behavioral Observations  Eval Pending            Pain   Denies pain   < 2   Assess pain QS/PRN    Skin    Stage 2 on penis, DTI on buttock and cellulitis on left elbow Skin healing     Doxy for cellulitis, treatments for MASD, DTI and blisters. Assess skin q shift and continue skin care per orders    Discharge Planning:  TBA. Per EMR,  pt to d/c to home with 24/7 hired caregivers. SW will confirm no barriers to discharge.   Team Discussion: Patient with left temporal contusion/SAH/SDH post falls. Note receptive language issues and difficulty with conversation with decreased insight into deficits, decreased awareness and poor safety awareness. Progress limited by pain/discomfort in left shoulder although patient declines medication for pain. New orders for heat therapy and scheduled pain meds per MD. Telesitter in use for safety due to impulsivity.  Patient on target to meet rehab goals: Evals pending; currently needs mod assist for ADLs. Tolerating a regular diet/thin consistency diet.   *See Care Plan and progress notes for long and short-term goals.   Revisions to Treatment Plan:  TBI behavior modification plan activated  Teaching Needs: Safety, fall prevention tips, medications, transfers, toileting, etc  Current Barriers to Discharge: Behavior  Possible Resolutions to Barriers: Family education     Medical Summary Current Status: TBI after fall, on a/c for a fib. left shoulder replacement previously, other fall.  Barriers to Discharge: Medical stability;Uncontrolled Pain;Behavior/Mood   Possible Resolutions to [employer]: daily assessment of labs and pt data. pain control. sleep restoratiojn   Continued Need for Acute Rehabilitation Level of Care: The patient requires daily medical management by a physician with specialized training in physical medicine and rehabilitation for the following reasons: Direction of a multidisciplinary  physical rehabilitation program to maximize functional independence : Yes Medical management of patient stability for increased activity during participation in an intensive rehabilitation regime.: Yes Analysis of laboratory values and/or radiology reports with any subsequent need for medication adjustment and/or medical intervention. : Yes   I attest that I was present,  lead the team conference, and concur with the assessment and plan of the team.   Chana Bode B 04/17/2022, 11:13 AM

## 2022-04-17 NOTE — Evaluation (Signed)
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   GERD (gastroesophageal reflux disease)    Hemochromatosis    Hyperlipidemia    Hypertension    Irritable bowel syndrome    Permanent atrial fibrillation (H[facility])    Seizure (H[facility])    Stroke (HCC)    Transient Ischemic Attack   Past Surgical History:  Past Surgical History:  Procedure Laterality Date   ABDOMINAL AORTIC ANEURYSM REPAIR     ENDOVASCULAR STENT INSERTION  07/18/2007   saccular infrarenal aortic aneurysm   EYE SURGERY Bilateral    Cataract     IR FLUORO GUIDE CV LINE LEFT  12/08/2018   IR FLUORO GUIDE CV LINE RIGHT  02/13/2017   IR FLUORO GUIDE CV LINE RIGHT  02/25/2017   IR US GUIDE VASC ACCESS RIGHT  02/13/2017   PICC LINE PLACE PERIPHERAL ([place] [facility])  Oct 21, 2014    Assessment & Plan Clinical Impression: 86 y.o. male with medical history significant of chronic A-fib on Eliquis, seizure disorder on Keppra, hemochromatosis, HTN, HLD, chronic ambulation dysfunction with frequent falls, AAA s/p repair, recent UTI, came with fall and strokelike symptoms.  MRI positive for 1.8 cm hemorrhagic contusion in the left anterior temporal lobe  as well as overlying subdural and subarachnoid hemorrhage. Mild mass  effect on  the underlying brain parenchyma but no midline shift.   Patient currently requires max with basic self-care skills secondary to muscle weakness, decreased cardiorespiratoy endurance, impaired timing and sequencing, unbalanced muscle activation, decreased coordination, and decreased motor planning, decreased visual perceptual skills, decreased motor planning, decreased attention, decreased awareness, decreased problem solving, decreased safety awareness, decreased memory, delayed processing, and demonstrates behaviors consistent with [place], and decreased sitting balance, decreased standing balance, decreased postural control, and decreased balance strategies.  Prior to hospitalization, patient could complete BADL/IADL with supervision.  Patient will benefit from skilled intervention to decrease level of assist with basic self-care skills and increase independence with basic self-care skills prior to discharge home with care partner.  Anticipate patient will require 24 hour supervision and minimal physical assistance and follow up home health.  OT - End of Session Activity Tolerance: Tolerates 10 - 20 min activity with multiple rests Endurance Deficit: Yes OT Assessment Rehab Potential (ACUTE ONLY): Good OT Patient demonstrates impairments in the following area(s): Balance;Cognition;Endurance;Motor;Nutrition;Pain;Safety;Vision OT Basic ADL's Functional Problem(s): Grooming;Bathing;Dressing;Toileting OT Transfers Functional Problem(s): Toilet;Tub/Shower OT Additional Impairment(s): Fuctional Use of Upper Extremity OT Plan OT Intensity: Minimum of 1-2 x/day, 45 to 90 minutes OT Frequency: 5 out of 7 days OT Duration/Estimated Length of Stay: 12-16 days OT Treatment/Interventions: Balance/vestibular training;DME/adaptive equipment instruction;Patient/family education;Therapeutic Activities;Wheelchair propulsion/positioning;Therapeutic Exercise;Cognitive remediation/compensation;Psychosocial  support;Functional electrical stimulation;Community reintegration;Functional mobility training;Self Care/advanced ADL retraining;UE/LE Strength taining/ROM;UE/LE Coordination activities;Skin care/wound managment;Neuromuscular re-education;Discharge planning;Disease mangement/prevention;Pain management;Splinting/orthotics;Visual/perceptual remediation/compensation OT Self Feeding Anticipated Outcome(s): nogoal OT  Basic Self-Care Anticipated Outcome(s): S OT Toileting Anticipated Outcome(s): S OT Bathroom Transfers Anticipated Outcome(s): S OT Recommendation Patient destination: Home Follow Up Recommendations: Home health OT Equipment Recommended: None recommended by OT   OT Evaluation Precautions/Restrictions  Precautions Precautions: Fall Restrictions Weight Bearing Restrictions: No General Chart Reviewed: Yes Family/Caregiver Present: No Vital Signs Therapy Vitals Temp: 98.3 F (36.8 C) Temp Source: Oral Pulse Rate: 80 Resp: 18 BP: 135/88 Patient Position (if appropriate): Sitting Pain Pain Assessment Pain Scale: 0-10 Pain Score: 5  Pain Intervention(s): Medication (See eMAR) Home Living/Prior Functioning Home Living Available Help at Discharge: Family, Personal care attendant, Available 24 hours/day Type of Home: House Home Access: Level entry Home Layout: One level Bathroom Shower/Tub: Multimedia programmer: Handicapped height Bathroom Accessibility: Yes  Lives With: Spouse Prior Function Level of Independence: Independent with basic ADLs, Independent with homemaking with ambulation Vision Baseline Vision/History: 1 Wears glasses Ability to See in Adequate Light: 1 Impaired Patient Visual Report: No change from baseline Vision Assessment?: Vision impaired- to be further tested in functional context Perception  Perception: Within Functional Limits Praxis Praxis: Impaired Praxis Impairment Details: Motor planning Cognition Cognition Overall  Cognitive Status: Impaired/Different from baseline Arousal/Alertness: Awake/alert Orientation Level: Person;Place;Situation Person: Oriented Place: Oriented Situation: Oriented Memory: Impaired Awareness: Impaired Problem Solving: Impaired Safety/Judgment: Impaired Brief Interview for Mental Status (BIMS) Repetition of Three Words (First Attempt): 3 Temporal Orientation: Year: Missed by more than 5 years Temporal Orientation: Month: Missed by more than 1 month Temporal Orientation: Day: Incorrect Recall: "Sock": No, could not recall Recall: "Blue": Yes, after cueing ("a color") Recall: "Bed": No, could not recall BIMS Summary Score: 4 Sensation Sensation Light Touch: Impaired by gross assessment Coordination Gross Motor Movements are Fluid and Coordinated: No Fine Motor Movements are Fluid and Coordinated: No Coordination and Movement Description: pain with L shoulder impacting  reach/movement Motor  Motor Motor: Abnormal postural alignment and control  Trunk/Postural Assessment  Cervical Assessment Cervical Assessment:  (head forward) Thoracic Assessment Thoracic Assessment:  (rounded shoulders) Lumbar Assessment Lumbar Assessment:  (post pelvic tilt) Postural Control Postural Control: Deficits on evaluation  Balance Balance Balance Assessed: Yes Static Sitting Balance Static Sitting - Level of Assistance: 4: Min assist;5: Stand by assistance Dynamic Sitting Balance Dynamic Sitting - Level of Assistance: 4: Min assist;3: Mod assist Static Standing Balance Static Standing - Level of Assistance: 3: Mod assist Extremity/Trunk Assessment RUE Assessment RUE Assessment: Within Functional Limits LUE Assessment LUE Assessment: Exceptions to [facility] General Strength Comments: continues to be limited by pain- movement 30-40 degrees in all planes in shoulder, wrist and hand [facility]  Care Tool Care Tool Self Care Eating        Oral Care         Bathing               Upper Body Dressing(including orthotics)            Lower Body Dressing (excluding footwear)          Putting on/Taking off footwear             Care Tool Toileting Toileting activity         Care Tool Bed Mobility Roll left and right activity        Sit to lying activity        Lying to sitting on side of bed activity         Care Tool Transfers Sit to stand transfer  Chair/bed transfer         Toilet transfer         Care Tool Cognition  Expression of Ideas and Wants    Understanding Verbal and Non-Verbal Content     Memory/Recall Ability     Refer to Care Plan for Long Term Goals  SHORT TERM GOAL WEEK 1 OT Short Term Goal 1 (Week 1): Pt will complete 2/3 steps of donning pants OT Short Term Goal 2 (Week 1): Pt will complete toilet transfer with LRAD at CGA level OT Short Term Goal 3 (Week 1): Pt will don shirt EOB with no A for sitting balance OT Short Term Goal 4 (Week 1): Pt will bathe with MIN A at sit to stand level  Recommendations for other services: None    Skilled Therapeutic Intervention ADL ADL Eating: Supervision/safety Where Assessed-Eating: Chair Grooming: Minimal assistance Where Assessed-Grooming: Standing at sink Upper Body Bathing: Moderate assistance Where Assessed-Upper Body Bathing: Edge of bed Lower Body Bathing: Moderate assistance Where Assessed-Lower Body Bathing: Edge of bed Upper Body Dressing: Moderate assistance Where Assessed-Upper Body Dressing: Edge of bed Lower Body Dressing: Maximal assistance Where Assessed-Lower Body Dressing: Edge of bed Toileting: Maximal assistance Where Assessed-Toileting: Bedside Commode Toilet Transfer: Moderate assistance Toilet Transfer Method: Stand pivot Mobility  Transfers Sit to Stand: Moderate Assistance - Patient 50-74% Stand to Sit: Moderate Assistance - Patient 50-74%  1:1. Pt educated on OT role/purpose, CIR, ELOS, and TBI recovery. Pt hard of hearing with  clear language deficits impacting his understanding of demands needing encouragement to participate. Pt bed soiled therefore needs to get OOB to change linnens. Pt completes BADL at EOB as stated above before transfering into recliner to eat. Overall pt limited by pain/ROM in shoulder and back needing up to MIN A for sitting balance at EOB and MOD A to stand from EOB with RW. Pt does better with choices instead of yes or no questions. Exited session with pt seated in recliner, exit alarm on and call light in reach     Discharge Criteria: Patient will be discharged from OT if patient refuses treatment 3 consecutive times without medical reason, if treatment goals not met, if there is a change in medical status, if patient makes no progress towards goals or if patient is discharged from hospital.  The above assessment, treatment plan, treatment alternatives and goals were discussed and mutually agreed upon: by patient  [name] 04/17/2022, 8:56 AM

## 2022-04-17 NOTE — Plan of Care (Signed)
  Problem: RH Balance Goal: LTG Patient will maintain dynamic sitting balance (PT) Description: LTG:  Patient will maintain dynamic sitting balance with assistance during mobility activities (PT) Flowsheets (Taken 04/17/2022 1813) LTG: Pt will maintain dynamic sitting balance during mobility activities with:: Independent Goal: LTG Patient will maintain dynamic standing balance (PT) Description: LTG:  Patient will maintain dynamic standing balance with assistance during mobility activities (PT) Flowsheets (Taken 04/17/2022 1813) LTG: Pt will maintain dynamic standing balance during mobility activities with:: Contact Guard/Touching assist   Problem: RH Bed Mobility Goal: LTG Patient will perform bed mobility with assist (PT) Description: LTG: Patient will perform bed mobility with assistance, with/without cues (PT). Flowsheets (Taken 04/17/2022 1813) LTG: Pt will perform bed mobility with assistance level of: Supervision/Verbal cueing   Problem: RH Bed to Chair Transfers Goal: LTG Patient will perform bed/chair transfers w/assist (PT) Description: LTG: Patient will perform bed to chair transfers with assistance (PT). Flowsheets (Taken 04/17/2022 1813) LTG: Pt will perform Bed to Chair Transfers with assistance level: Contact Guard/Touching assist   Problem: RH Car Transfers Goal: LTG Patient will perform car transfers with assist (PT) Description: LTG: Patient will perform car transfers with assistance (PT). Flowsheets (Taken 04/17/2022 1813) LTG: Pt will perform car transfers with assist:: Contact Guard/Touching assist   Problem: RH Ambulation Goal: LTG Patient will ambulate in controlled environment (PT) Description: LTG: Patient will ambulate in a controlled environment, # of feet with assistance (PT). Flowsheets (Taken 04/17/2022 1813) LTG: Pt will ambulate in controlled environ  assist needed:: Contact Guard/Touching assist LTG: Ambulation distance in controlled environment: 75 ft  using LRAD Goal: LTG Patient will ambulate in home environment (PT) Description: LTG: Patient will ambulate in home environment, # of feet with assistance (PT). Flowsheets (Taken 04/17/2022 1813) LTG: Pt will ambulate in home environ  assist needed:: Contact Guard/Touching assist LTG: Ambulation distance in home environment: 50 ft using LRAD   Problem: RH Attention Goal: LTG Patient will demonstrate this level of attention during functional activites (PT) Description: LTG:  Patient will demonstrate this level of attention during functional activites (PT) Flowsheets (Taken 04/17/2022 1813) Patient will demonstrate this level of attention during functional activites: Selective Patient will demonstrate above attention level in the following environment: Home LTG: Patient will demonstrate attention during functional mobility with assistance of: Minimal Assistance - Patient > 75%

## 2022-04-17 NOTE — Progress Notes (Signed)
Patient ID: Eugene [name], male   DOB: November 02, 1935, 86 y.o.   MRN: [id number]  SW made efforts to meet with pt in room but pt sleeping while caregiver present. SW will follow-up to complete assessment.   [id number] spoke with pt dtr [name] to introduce self, explain role, discuss d/c process, and inform on ELOS. She confirms that pt will d/c to home with hired caregivers, and PRN assistance from her and 4 other siblings. She reports that [facility] made a referral for a hospital bed at their request and they are waiting on pt PCP to sign order. SW will follow-up with updates after team conference.   Cecile Sheerer, MSW, LCSWA Office: [phone] Cell: [phone] Fax: [phone]

## 2022-04-17 NOTE — Progress Notes (Signed)
Patient ID: Eugene [name], male   DOB: 03/27/1936, 86 y.o.   MRN: [id number] Met with the patient to review current situation, team conference and plan of care. Discussed secondary risk management, medications and dietary needs. Reinforced need for increased protein intake; patient likes Boost and will eat chocolate flavored "anything", encouraged magic cups for dessert. Reviewed dietary intake low; not a big eater PTA; reviewed preferences and kitchen notified of likes/dislikes. Continue to follow along to address educational needs to facilitate preparation for discharge. Margarito Liner

## 2022-04-17 NOTE — Progress Notes (Signed)
Inpatient Rehabilitation  Patient information reviewed and entered into eRehab system by Keeton Kassebaum M. Quinterius Gaida, M.A., CCC/SLP, PPS Coordinator.  Information including medical coding, functional ability and quality indicators will be reviewed and updated through discharge.    

## 2022-04-17 NOTE — Evaluation (Addendum)
Speech Language Pathology Assessment and Plan  Patient Details  Name: Eugene [name] MRN: [id number] Date of Birth: [birth year]  SLP Diagnosis: Aphasia;Cognitive Impairments;Dysphagia  Rehab Potential: Good ELOS: 12-16 days    Today's Date: 04/17/2022 SLP Individual Time: 1030-1130 SLP Individual Time Calculation (min): 76 min   Hospital Problem: Principal Problem:   TBI (traumatic brain injury) [facility])  Past Medical History:  Past Medical History:  Diagnosis Date   AAA (abdominal aortic aneurysm) ([place]) 06/29/2007   stent graft   Aortic stenosis 09/05/2017   Mild AS/AR by echo 01/2017.   GERD (gastroesophageal reflux disease)    Hemochromatosis    Hyperlipidemia    Hypertension    Irritable bowel syndrome    Permanent atrial fibrillation (HCC)    Seizure (HCC)    Stroke (HCC)    Transient Ischemic Attack   Past Surgical History:  Past Surgical History:  Procedure Laterality Date   ABDOMINAL AORTIC ANEURYSM REPAIR     ENDOVASCULAR STENT INSERTION  07/18/2007   saccular infrarenal aortic aneurysm   EYE SURGERY Bilateral    Cataract     IR FLUORO GUIDE CV LINE LEFT  12/08/2018   IR FLUORO GUIDE CV LINE RIGHT  02/13/2017   IR FLUORO GUIDE CV LINE RIGHT  02/25/2017   IR US GUIDE VASC ACCESS RIGHT  02/13/2017   PICC LINE PLACE PERIPHERAL ([place] [facility])  Oct 21, 2014    Assessment / Plan / Recommendation Clinical Impression Patient Admitting Diagnosis: SDH, SAH History of Present Illness: Pt is an 86 year old male with medical hx significant for: A-fib on Eliquis, GERD, hemochromatosis, NSVT, h/o CVA, recent h/o traumatic L hemothorax requiring chest tube, CKD stage III, CHF. Pt presented to [facility] on 04/09/22 d/t fall the Wednesday prior to presentation and resulting aphasia. CT scan revealed hemorrhagic L temporal contusion with overlying SDH and SAH with mild mass effect and no midline shift. Neurosurgery did not recommend surgical intervention. EEG on 11/14  suggestive of cortical dysfunction in left hemisphere. Also noted mild diffuse encephalopathy. No seizures seen. Pt had possible aspiration event on 11/14 when taking medication. Pt placed on 2-3L of O2 via nasal cannula. Pt also noted to have an increased amount of secretion. Scopolamine patch started. Follow up CT was stable. Therapy evaluations completed and CIR recommended d/t pt's deficits in functional mobility, inability to complete ADLs independently, and cognitive-linguistic deficits. Complete NIHSS TOTAL: 2  SLP consulted to complete CSE and cognitive-communication evaluation in the setting of acute SDH + SAH s/p fall. Pt encountered awake and EOB in recliner chair. C/o L shoulder pain; provided repositioning and notified LPN who stated he recently received pain medication. Pt agreeable to evaluation. Pt appears very hard of hearing, which was a compounding factor during today's assessment. Pt's daughter present.  Per formal and informal assessment measures, pt presents with at least moderate-severe cognitive impairment and at least mild receptive + mild-moderate expressive language deficits, which appears to have improved since initial cognitive-communication evaluation completed on 11/14. At this time, pt requires Min-Mod A for verbal expression and Mod-Max A for cognition. Receptive language appeared limited by hearing acuity and processing speed, in addition to aphasia and would intermittently become upset or frustrated when unable to understand information. Pt did appear to intermittently benefit from elevated voice, visual/written aids, and slow speaking rate from communication partner. Pt unable to complete [facility] SLUMS in its entirety due to severity of cognitive deficits compounded by processing speed and hearing acuity, though did  exhibit deficits in orientation, sustained attention, recall, and executive functioning on tasks that were attempted. Upon inquiry, pt's daughter reports the  greatest change to be in pt's verbal expression. When attempting to verbally communicate, pt exhibited hesitance's, semantic + phonemic paraphasias, and neologisms; did not appear aware of errors. MLU was noted to be diminished with decrease in affect and eye contact.   Re: CSE, pt presents with functional oral phase and only one instance of strong cough with large sip of thin liquid via cup. No clinical s/sx concerning for airway compromise nor esophageal findings when self-administering puree and regular solid textures this date. Thin liquid intake was limited. Per chart review, VSS and pt receiving scheduled PPI and H2 blocker. Recommend continuation of regular diet textures with thin liquids given full supervision from his personal care staff and medications whole in puree or with thin liquids.    Given assessment findings, pt would benefit from skilled ST intervention, during this IPR admission, to maximize pt's independence, quality of life, and decrease caregiver burden. May consider OP audiology evaluation given worsening of hearing acuity, per daughter's report. Pt and pt's daughter verbalized understanding and agreement with proposed ST POC; please see below for details.    Skilled Therapeutic Interventions          CSE, portions of [facility] SLUMS, and informal assessment measures administered. Please see above for details.   SLP Assessment  Patient will need skilled Speech Lanaguage Pathology Services during CIR admission    Recommendations  Recommended Consults: Consider GI evaluation SLP Diet Recommendations: Age appropriate regular solids;Thin Liquid Administration via: Cup;No straw Medication Administration: Whole meds with liquid Supervision: Patient able to self feed;Full supervision/cueing for compensatory strategies Compensations: Small sips/bites;Slow rate;Multiple dry swallows after each bite/sip;Follow solids with liquid Postural Changes and/or Swallow Maneuvers: Seated upright 90  degrees;Upright 30-60 min after meal Oral Care Recommendations: Oral care BID Recommendations for Other Services: Neuropsych consult Patient destination: Home Follow up Recommendations: Home Health SLP;Outpatient SLP;24 hour supervision/assistance Equipment Recommended: None recommended by SLP    SLP Frequency 3 to 5 out of 7 days   SLP Duration  SLP Intensity  SLP Treatment/Interventions 12-16 days  Minumum of 1-2 x/day, 30 to 90 minutes  Cognitive remediation/compensation;Functional tasks;Dysphagia/aspiration precaution training;Internal/external aids;Patient/family education    Pain    Prior Functioning Cognitive/Linguistic Baseline: Information not available Type of Home: House  Lives With: Alone Available Help at Discharge: Family;Personal care attendant;Available 24 hours/day Vocation: Retired  Programmer, systems Overall Cognitive Status: Impaired/Different from baseline Arousal/Alertness: Awake/alert Orientation Level: Oriented to person;Oriented to situation;Disoriented to place;Oriented to time Year: 2023 Month: November Day of Week: Incorrect Attention: Sustained Sustained Attention: Impaired Sustained Attention Impairment: Verbal basic;Verbal complex Memory: Impaired Memory Impairment: Storage deficit;Retrieval deficit;Decreased short term memory Decreased Short Term Memory: Verbal basic Awareness: Impaired Awareness Impairment: Emergent impairment;Anticipatory impairment Problem Solving: Impaired Problem Solving Impairment: Verbal basic Behaviors: Poor frustration tolerance Safety/Judgment: Impaired  Comprehension Auditory Comprehension Overall Auditory Comprehension: Appears within functional limits for tasks assessed Basic Biographical Questions: 76-100% accurate Basic Immediate Environment Questions: 75-100% accurate Commands: Impaired One Step Basic Commands: 75-100% accurate (100% with repetition) Two Step Basic Commands: 75-100% accurate  (with multiple repetitions) Conversation: Simple Interfering Components: Processing speed;Hearing EffectiveTechniques: Repetition;Extra processing time;Visual/Gestural cues;Increased volume Visual Recognition/Discrimination Discrimination: Not tested Reading Comprehension Word level: Within functional limits Sentence Level: Within functional limits Expression Expression Primary Mode of Expression: Verbal Verbal Expression Overall Verbal Expression: Impaired Initiation: Impaired Automatic Speech: Counting (WFL) Level of Generative/Spontaneous Verbalization: Phrase  Repetition: No impairment (at word level) Naming: Impairment Responsive: 76-100% accurate Confrontation: Within functional limits Convergent: Not tested Divergent: 75-100% accurate Verbal Errors: Semantic paraphasias;Phonemic paraphasias;Neologisms;Not aware of errors Pragmatics: Impairment Impairments: Abnormal affect;Eye contact Written Expression Dominant Hand: Right Written Expression: Not tested Oral Motor Oral Motor/Sensory Function Overall Oral Motor/Sensory Function:  (Appears grossly WFL) Motor Speech Overall Motor Speech: Appears within functional limits for tasks assessed Phonation: Normal  Care Tool Care Tool Cognition Ability to hear (with hearing aid or hearing appliances if normally used Ability to hear (with hearing aid or hearing appliances if normally used): 2. Moderate difficulty - speaker has to increase volume and speak distinctly   Expression of Ideas and Wants Expression of Ideas and Wants: 2. Frequent difficulty - frequently exhibits difficulty with expressing needs and ideas   Understanding Verbal and Non-Verbal Content Understanding Verbal and Non-Verbal Content: 2. Sometimes understands - understands only basic conversations or simple, direct phrases. Frequently requires cues to understand  Memory/Recall Ability Memory/Recall Ability : Current season    Bedside Swallowing  Assessment General Date of Onset: 04/10/22 Previous Swallow Assessment: BSE on 11/14 Diet Prior to this Study: Regular;Thin liquids Temperature Spikes Noted: No Respiratory Status: Room air History of Recent Intubation: No Behavior/Cognition: Alert;Cooperative Oral Cavity - Dentition: Adequate natural dentition Self-Feeding Abilities: Able to feed self Vision: Functional for self-feeding Patient Positioning: Upright in chair/Tumbleform Baseline Vocal Quality: Normal  Ice Chips Ice chips: Not tested Thin Liquid Thin Liquid: Impaired Presentation: Cup Pharyngeal  Phase Impairments: Cough - Delayed;Suspected delayed Swallow Nectar Thick Nectar Thick Liquid: Not tested Honey Thick Honey Thick Liquid: Not tested Puree Puree: Within functional limits Presentation: Self Fed;Spoon Solid Solid: Within functional limits BSE Assessment Risk for Aspiration Impact on safety and function: Mild aspiration risk Other Related Risk Factors: History of GERD;History of dysphagia;Previous CVA  Short Term Goals: Week 1: SLP Short Term Goal 1 (Week 1): Pt will tolerate regular diet with thin liquids with minimal s/sx conccerning for airway invasion and stable medical status at the Sup A level. SLP Short Term Goal 2 (Week 1): Pt will participate in various naming tasks with 90% accuracy at the Mod I level. SLP Short Term Goal 3 (Week 1): Pt will demonstrate orientation x 4 with Sup A. SLP Short Term Goal 4 (Week 1): Pt will communicate wants, needs, and ideas ~80% of the time given Min A. SLP Short Term Goal 5 (Week 1): Pt will demonstrate increased intellectual and emergent awareness by naming 2 deficits s/p TBI with Mod A.  Refer to Care Plan for Long Term Goals  Recommendations for other services: Neuropsych  Discharge Criteria: Patient will be discharged from SLP if patient refuses treatment 3 consecutive times without medical reason, if treatment goals not met, if there is a change in  medical status, if patient makes no progress towards goals or if patient is discharged from hospital.  The above assessment, treatment plan, treatment alternatives and goals were discussed and mutually agreed upon: by patient and by family  Romelle Starcher A  04/17/2022, 1:24 PM

## 2022-04-17 NOTE — Progress Notes (Signed)
PROGRESS NOTE   Subjective/Complaints: Didn't sleep real well as left shoulder bothered him. Didn't take anything for pain, however. Working with OT when I came in  ROS: Patient denies fever, rash, sore throat, blurred vision, dizziness, nausea, vomiting, diarrhea, cough, shortness of breath or chest pain,    Objective:   No results found. Recent Labs    04/17/22 0525  WBC 6.2  HGB 12.6*  HCT 36.2*  PLT 269   Recent Labs    04/17/22 0525  NA 136  K 3.5  CL 102  CO2 22  GLUCOSE 97  BUN 14  CREATININE 1.04  CALCIUM 9.0    Intake/Output Summary (Last 24 hours) at 04/17/2022 0902 Last data filed at 04/17/2022 3557 Gross per 24 hour  Intake 420 ml  Output 678 ml  Net -258 ml     Pressure Injury 01/11/22 Coccyx Medial Deep Tissue Pressure Injury - Purple or maroon localized area of discolored intact skin or blood-filled blister due to damage of underlying soft tissue from pressure and/or shear. DTI sacrum (Active)  01/11/22 2235  Location: Coccyx  Location Orientation: Medial  Staging: Deep Tissue Pressure Injury - Purple or maroon localized area of discolored intact skin or blood-filled blister due to damage of underlying soft tissue from pressure and/or shear.  Wound Description (Comments): DTI sacrum  Present on Admission: Yes     Pressure Injury 04/16/22 Buttocks Right Stage 2 -  Partial thickness loss of dermis presenting as a shallow open injury with a red, pink wound bed without slough. (Active)  04/16/22 1837  Location: Buttocks  Location Orientation: Right  Staging: Stage 2 -  Partial thickness loss of dermis presenting as a shallow open injury with a red, pink wound bed without slough.  Wound Description (Comments):   Present on Admission: Yes     Pressure Injury 04/16/22 Penis Anterior Stage 2 -  Partial thickness loss of dermis presenting as a shallow open injury with a red, pink wound bed without  slough. (Active)  04/16/22 1840  Location: Penis  Location Orientation: Anterior  Staging: Stage 2 -  Partial thickness loss of dermis presenting as a shallow open injury with a red, pink wound bed without slough.  Wound Description (Comments):   Present on Admission: Yes    Physical Exam: Vital Signs Blood pressure 135/88, pulse 80, temperature 98.3 F (36.8 C), temperature source Oral, resp. rate 18, height 6' (1.829 m), weight 82.3 kg, SpO2 93 %.  General: Alert and oriented x 3, No apparent distress HEENT: Head is normocephalic, atraumatic, PERRLA, EOMI, sclera anicteric, oral mucosa pink and moist, dentition intact, ext ear canals clear,  Neck: Supple without JVD or lymphadenopathy Heart: Reg rate and rhythm. No murmurs rubs or gallops Chest: CTA bilaterally without wheezes. Crackles at both bases, L>R Abdomen: Soft, non-tender, non-distended, bowel sounds positive. Extremities: No clubbing, cyanosis, or edema. Pulses are 2+ Psych: Pt's affect is appropriate. Pt is cooperative Skin: a few scattered bruises and lacs, hematoma right forearm. Chronic venous changes bilateral LE's. Other wounds as follows            Neuro:  pt alert and oriented x3. A little HOH. Occasionally  word finding deficits. Visual fields intact. Reasonable insight and awareness. Followed basic commands. LUE limited by pain in shoulder, otherwise 4/5. RUE 5/5. LE's 4- prox to 5/5 disally.  Musculoskeletal: left shoulder tender with PROM. Really only has 30-40 degrees for abduct/flex/ext before significant pain.     Assessment/Plan: 1. Functional deficits which require 3+ hours per day of interdisciplinary therapy in a comprehensive inpatient rehab setting. Physiatrist is providing close team supervision and 24 hour management of active medical problems listed below. Physiatrist and rehab team continue to assess barriers to discharge/monitor patient progress toward functional and medical goals  Care  Tool:  Bathing              Bathing assist       Upper Body Dressing/Undressing Upper body dressing        Upper body assist      Lower Body Dressing/Undressing Lower body dressing            Lower body assist       Toileting Toileting    Toileting assist       Transfers Chair/bed transfer  Transfers assist           Locomotion Ambulation   Ambulation assist              Walk 10 feet activity   Assist           Walk 50 feet activity   Assist           Walk 150 feet activity   Assist           Walk 10 feet on uneven surface  activity   Assist           Wheelchair     Assist               Wheelchair 50 feet with 2 turns activity    Assist            Wheelchair 150 feet activity     Assist          Blood pressure 135/88, pulse 80, temperature 98.3 F (36.8 C), temperature source Oral, resp. rate 18, height 6' (1.829 m), weight 82.3 kg, SpO2 93 %.  Medical Problem List and Plan: 1. Functional deficits secondary to Left temporal contusion             -patient may  shower             -ELOS/Goals: 12-14d, minA/Sup   -Patient is beginning CIR therapies today including PT, OT, and SLP. Conf today 2.  Antithrombotics: -DVT/anticoagulation:  Mechanical: Sequential compression devices, below knee Bilateral lower extremities             -antiplatelet therapy: N/a 3. Pain Management: Tylenol prn.   -will add kpad  -schedule tylenol 4. Mood/Behavior/Sleep: LCSW to follow for evaluation and support.             --Continue melatonin with Trazodone prn for insomnia.             -antipsychotic agents: Continue Zyprexa. may be able to wean this soon 5. Neuropsych/cognition: This patient is not quite capable of making decisions on his own behalf. 6. Skin/Wound Care: Routine pressure relief measures.  to multiple wounds 7. Fluids/Electrolytes/Nutrition: Monitor I/O.   -encourage PO -labs  reasonable this morning -protein supp for low albumin  8. CAF: Monitor HR TID--continue to hold Eliquis.              --  CT head ordered for 11/22 am  9. H/o GERD: Continue Pepcid--resume PPI. 10. HTN: Monitor BP TID--on nimotop thru 12/05. Continue Lopressor.   11/21 bp controlled 11. Left moderate pleural effusion/Emphysema per X rays: Flutter valve added for pulmonary hygiene.   -add IS also 12. Oral secretions: Continue scopolamine patch.   13. Seizure Hx?: Keppra was d/c  after 7d prophyllaxis following head trauma 14. Hx AAA, control BP    LOS: 1 days A FACE TO FACE EVALUATION WAS PERFORMED  Ranelle Oyster 04/17/2022, 9:02 AM

## 2022-04-17 NOTE — Plan of Care (Addendum)
Behavioral Plan   Rancho Level: Rancho 6  Behavior to decrease/ eliminate:  Implusivity Out of bed on own Confusion at night time  Incontinence  Changes to environment:  Lights on, blinds open during the day; off and closed at night Telesitter Belt alarm when in recliner/w/c Offer toileting with interactions especially at night time  Interventions: Gently reorient as needed Sleep chart  Seroquel ordered at night time QD schedule for improved tolerance and participation  Recommendations for interactions with patient: Dont ask yes or know questions-give 2 choices of what to do, pt will say no automatically If pateint is not understanding what you are saying, write it down (receptive aphasia+Hard of hearing)   Attendees:  Sheliah Mends, PT Chong Sicilian, RN Zannie Kehr, OT Ronney Lion, SLP

## 2022-04-17 NOTE — Plan of Care (Signed)
  Problem: RH Swallowing Goal: LTG Patient will consume least restrictive diet using compensatory strategies with assistance (SLP) Description: LTG:  Patient will consume least restrictive diet using compensatory strategies with assistance (SLP) Flowsheets (Taken 04/17/2022 1340) LTG: Pt Patient will consume least restrictive diet using compensatory strategies with assistance of (SLP): Supervision   Problem: RH Cognition - SLP Goal: RH LTG Patient will demonstrate orientation with cues Description:  LTG:  Patient will demonstrate orientation to person/place/time/situation with cues (SLP)   Flowsheets (Taken 04/17/2022 1340) LTG Patient will demonstrate orientation to:  Place  Time LTG: Patient will demonstrate orientation using cueing (SLP): Supervision   Problem: RH Expression Communication Goal: LTG Patient will verbally express basic/complex needs(SLP) Description: LTG:  Patient will verbally express basic/complex needs, wants or ideas with cues  (SLP) Flowsheets (Taken 04/17/2022 1340) LTG: Patient will verbally express basic/complex needs, wants or ideas (SLP): Minimal Assistance - Patient > 75% Goal: LTG Patient will increase word finding of common (SLP) Description: LTG:  Patient will increase word finding of common objects/daily info/abstract thoughts with cues using compensatory strategies (SLP). Flowsheets (Taken 04/17/2022 1340) LTG: Patient will increase word finding of common (SLP): Minimal Assistance - Patient > 75% Patient will use compensatory strategies to increase word finding of:  Common objects  Daily info   Problem: RH Memory Goal: LTG Patient will use memory compensatory aids to (SLP) Description: LTG:  Patient will use memory compensatory aids to recall biographical/new, daily complex information with cues (SLP) Flowsheets (Taken 04/17/2022 1340) LTG: Patient will use memory compensatory aids to (SLP): Minimal Assistance - Patient > 75%   Problem: RH  Attention Goal: LTG Patient will demonstrate this level of attention during functional activites (SLP) Description: LTG:  Patient will will demonstrate this level of attention during functional activites (SLP) Flowsheets (Taken 04/17/2022 1340) Patient will demonstrate during cognitive/linguistic activities the attention type of: Sustained Patient will demonstrate this level of attention during cognitive/linguistic activities in: Controlled LTG: Patient will demonstrate this level of attention during cognitive/linguistic activities with assistance of (SLP): Minimal Assistance - Patient > 75% Number of minutes patient will demonstrate attention during cognitive/linguistic activities: 30   Problem: RH Awareness Goal: LTG: Patient will demonstrate awareness during functional activites type of (SLP) Description: LTG: Patient will demonstrate awareness during functional activites type of (SLP) Flowsheets (Taken 04/17/2022 1340) Patient will demonstrate during cognitive/linguistic activities awareness type of: Emergent LTG: Patient will demonstrate awareness during cognitive/linguistic activities with assistance of (SLP): Moderate Assistance - Patient 50 - 74%

## 2022-04-17 NOTE — Plan of Care (Addendum)
Wound Plan  Wounds present: See EPIC for pics Pressure Injury 01/11/22 Coccyx Medial Deep Tissue Pressure Injury  Pressure Injury 04/16/22 Buttocks Right Stage 2  Pressure Injury 04/16/22 Penis Anterior Stage 2  Cellulitis left elbow  Braden Score: 12 Sensory: 2 Moisture: 3 Activity: 2 Mobility: 2 Nutrition: 2 Friction: 1  Interventions:  Toileting protocol; keep groin/penis skin clean/dry Foam to sacrum/buttocks. Change TIW and prn Pressure relief cushion for wheelchair Roho cushion and TIS wheelchair Regular diet; eating < 50% Added magic cups and nutritional supplement Doxy on board for cellulitis Prosource BID Vit C, Vit B1 and Zinc daily  Contributors: Luevenia Maxin, LPN Blanch Media, OT [place], PT Chana Bode, RN

## 2022-04-17 NOTE — Evaluation (Signed)
Physical Therapy Assessment and Plan  Patient Details  Name: Eugene [name] MRN: [id number] Date of Birth: [birth year]  PT Diagnosis: Abnormal posture, Abnormality of gait, Cognitive deficits, Coordination disorder, Impaired sensation, Muscle weakness, and Pain in joint Rehab Potential: Fair ELOS: 2 weeks   Today's Date: 04/17/2022 PT Individual Time: 1300-1415 PT Individual Time Calculation (min): 75 min    Hospital Problem: Principal Problem:   TBI (traumatic brain injury) [facility])   Past Medical History:  Past Medical History:  Diagnosis Date   AAA (abdominal aortic aneurysm) ([place]) 06/29/2007   stent graft   Aortic stenosis 09/05/2017   Mild AS/AR by echo 01/2017.   GERD (gastroesophageal reflux disease)    Hemochromatosis    Hyperlipidemia    Hypertension    Irritable bowel syndrome    Permanent atrial fibrillation (HCC)    Seizure (HCC)    Stroke (HCC)    Transient Ischemic Attack   Past Surgical History:  Past Surgical History:  Procedure Laterality Date   ABDOMINAL AORTIC ANEURYSM REPAIR     ENDOVASCULAR STENT INSERTION  07/18/2007   saccular infrarenal aortic aneurysm   EYE SURGERY Bilateral    Cataract     IR FLUORO GUIDE CV LINE LEFT  12/08/2018   IR FLUORO GUIDE CV LINE RIGHT  02/13/2017   IR FLUORO GUIDE CV LINE RIGHT  02/25/2017   IR US GUIDE VASC ACCESS RIGHT  02/13/2017   PICC LINE PLACE PERIPHERAL ([place] [facility])  Oct 21, 2014    Assessment & Plan Clinical Impression: Patient is a 86 y.o. year old male with history of AS, NSVT/A fib- on Eliquis, CKD, hemochromatosis, ETOH abuse,  known to CIR from stay 12/2021 after fall with rib/Fx and L-HPTX who was admitted on 04/09/22 after fall on a week PTA on 04/09/22. Per reports, he started developing difficulty taking 2-3 days PTA progressing to inability to speak. CT head done revealing 1.8 cm hemorrhagic contusion in left anterior temporal lobe as well as overlying SDH/SAH with mild mass effect and moderate left  pleural effusion noted.  CT C-spine showed mild multilevel degenerative changes with facet arthropathy and nos high grade spinal stenosis. CXR showed no change in left pleural effusion c/w 10/04 films.  Dr. Ronnald Ramp evaluated patient and felt that no surgical intervention needed and to resume anticoagulation after a week with repeat CT head.  Follow up CT head showed no change in hemorrhagic contusion or mass effect.    EEG done showed mild diffuse encephalopathy without seizures or epileptiform discharges.  He was maintained on Keppra for  7 days for seizure prophylaxis--no history of seizure documented in PMHx. Speech therapy evaluation revealed severe expressive and moderate receptive aphasia with poor frustration tolerance and no attempts to communicate verbally or nonverbally.  He did have decline in respiratory status requiring 4 L O2 per [place] while in ED and scopolamine patch added due to copious oral secretions. Per NS note 11/15-->to start oral AC in 7-10 days and repeat CT head in 7-10 days. Therapy has been working with patient who continues to be limited by balance deficits, strong left lateral lean with fatigue, expressive/receptive deficits with delay in processing and difficulty following one step commands consistently. CIR recommended due to functional decline.  Patient transferred to CIR on 04/16/2022 .   Patient currently requires mod with mobility secondary to muscle weakness and muscle joint tightness, decreased cardiorespiratoy endurance, impaired timing and sequencing, decreased coordination, and decreased motor planning, decreased initiation, decreased attention, decreased awareness, decreased problem  solving, decreased safety awareness, decreased memory, delayed processing, and demonstrates behaviors consistent with [place], and decreased sitting balance, decreased standing balance, decreased postural control, decreased balance strategies, and difficulty maintaining precautions.  Prior  to hospitalization, patient was supervision using a RW with mobility and lived with Alone in a House home.  Home access is  Level entry.  Patient will benefit from skilled PT intervention to maximize safe functional mobility, minimize fall risk, and decrease caregiver burden for planned discharge home with 24 hour assist.  Anticipate patient will benefit from follow up [facility] at discharge.  PT - End of Session Activity Tolerance: Tolerates 10 - 20 min activity with multiple rests Endurance Deficit: Yes Endurance Deficit Description: increased HR >20 pts and SOB with minimal mobility (transfers), decreased sitting tolerance PT Assessment Rehab Potential (ACUTE/IP ONLY): Fair PT Barriers to Discharge: Lack of/limited family support;Behavior PT Barriers to Discharge Comments: new cognitive deficits with hx of frequent falls in the past year, may need increased assist at d/c compared to PTA PT Patient demonstrates impairments in the following area(s): Balance;Safety;Behavior;Sensory;Edema;Skin Integrity;Endurance;Motor;Nutrition;Pain;Perception PT Transfers Functional Problem(s): Bed Mobility;Bed to Chair;Car;Furniture PT Locomotion Functional Problem(s): Ambulation;Wheelchair Mobility;Stairs PT Plan PT Intensity: Minimum of 1-2 x/day ,45 to 90 minutes PT Frequency: 5 out of 7 days PT Duration Estimated Length of Stay: 2 weeks PT Treatment/Interventions: Ambulation/gait training;Community reintegration;Neuromuscular re-education;DME/adaptive equipment instruction;Psychosocial support;Stair training;UE/LE Strength taining/ROM;Wheelchair propulsion/positioning;UE/LE Coordination activities;Therapeutic Activities;Skin care/wound management;Pain management;Functional electrical stimulation;Discharge planning;Balance/vestibular training;Cognitive remediation/compensation;Disease management/prevention;Functional mobility training;Patient/family education;Splinting/orthotics;Visual/perceptual  remediation/compensation;Therapeutic Exercise PT Transfers Anticipated Outcome(s): CGA using LRAD PT Locomotion Anticipated Outcome(s): CGA using LRAD >75 ft PT Recommendation Follow Up Recommendations: Home health PT Patient destination: Home Equipment Recommended: Rolling walker with 5" wheels;Wheelchair (measurements)   PT Evaluation Precautions/Restrictions Precautions Precautions: Fall Restrictions Weight Bearing Restrictions: No Pain Interference Pain Interference Pain Effect on Sleep: 0. Does not apply - I have not had any pain or hurting in the past 5 days Pain Interference with Therapy Activities: 0. Does not apply - I have not received rehabilitationtherapy in the past 5 days Pain Interference with Day-to-Day Activities: 1. Rarely or not at all Home Living/Prior [place] Available Help at Discharge: Family;Personal care attendant;Available 24 hours/day Type of Home: House Home Access: Level entry Home Layout: One level  Lives With: Alone Prior Function Level of Independence: Independent with transfers;Independent with gait;Needs assistance with homemaking Driving: No Vocation: Retired Radiographer, therapeutic - History Ability to See in Adequate Light: 0 Adequate Perception Perception: Within Functional Limits Praxis Praxis Impairment Details: Motor planning  Cognition Overall Cognitive Status: Impaired/Different from baseline Arousal/Alertness: Awake/alert Orientation Level: Oriented to person;Oriented to situation;Disoriented to place;Oriented to time Year: 2023 Month: November Day of Week: Incorrect Attention: Sustained Sustained Attention: Impaired Sustained Attention Impairment: Verbal basic;Verbal complex Memory: Impaired Memory Impairment: Storage deficit;Retrieval deficit;Decreased short term memory Decreased Short Term Memory: Verbal basic Awareness: Impaired Awareness Impairment: Emergent impairment;Anticipatory  impairment Problem Solving: Impaired Problem Solving Impairment: Verbal basic Behaviors: Poor frustration tolerance Safety/Judgment: Impaired Sensation Sensation Light Touch: Impaired Detail Peripheral sensation comments: impaired B LE's distal to the knee (chronic per pt) Light Touch Impaired Details: Impaired RLE;Impaired LLE Hot/Cold: Not tested Proprioception: Appears Intact Stereognosis: Not tested Coordination Gross Motor Movements are Fluid and Coordinated: No Fine Motor Movements are Fluid and Coordinated: No Coordination and Movement Description: generalized deconditining with poor motor planning with mobility Motor  Motor Motor: Abnormal postural alignment and control;Motor apraxia   Trunk/Postural Assessment  Cervical Assessment Cervical Assessment: Exceptions to [facility] (head  forward) Thoracic Assessment Thoracic Assessment: Exceptions to [facility] (kyphotic) Lumbar Assessment Lumbar Assessment: Exceptions to [facility] (post pelvic tilt) Postural Control Postural Control: Deficits on evaluation (decreased/delayed; posterior bias)  Balance Balance Balance Assessed: Yes Standardized Balance Assessment Standardized Balance Assessment: Berg Balance Test Berg Balance Test Sit to Stand: Needs moderate or maximal assist to stand Standing Unsupported: Unable to stand 30 seconds unassisted Sitting with Back Unsupported but Feet Supported on Floor or Stool: Able to sit 30 seconds Stand to Sit: Needs assistance to sit Transfers: Needs one person to assist Standing Unsupported with Eyes Closed: Needs help to keep from falling Standing Ubsupported with Feet Together: Needs help to attain position and unable to hold for 15 seconds From Standing, Reach Forward with Outstretched Arm: Loses balance while trying/requires external support From Standing Position, Pick up Object from Floor: Unable to try/needs assist to keep balance From Standing Position, Turn to Look Behind Over each Shoulder:  Needs assist to keep from losing balance and falling Turn 360 Degrees: Needs assistance while turning Standing Unsupported, Alternately Place Feet on Step/Stool: Needs assistance to keep from falling or unable to try Standing Unsupported, One Foot in Front: Loses balance while stepping or standing Standing on One Leg: Unable to try or needs assist to prevent fall Total Score: 3 Static Sitting Balance Static Sitting - Balance Support: Bilateral upper extremity supported Static Sitting - Level of Assistance: 4: Min assist;5: Stand by assistance Dynamic Sitting Balance Dynamic Sitting - Balance Support: Right upper extremity supported;Left upper extremity supported Dynamic Sitting - Level of Assistance: 4: Min assist;3: Mod assist Theatre stage manager Standing - Balance Support: Bilateral upper extremity supported Static Standing - Level of Assistance: 3: Mod assist Dynamic Standing Balance Dynamic Standing - Balance Support: Bilateral upper extremity supported Dynamic Standing - Level of Assistance: 3: Mod assist Extremity Assessment  RLE Assessment RLE Assessment: Exceptions to [facility] Active Range of Motion (AROM) Comments: tight hamstring and heel cord General Strength Comments: Grossly at least 3+/5 with functional mobility and in sitting LLE Assessment LLE Assessment: Exceptions to [facility] Active Range of Motion (AROM) Comments: tight hamstring and heel cord General Strength Comments: Grossly at least 3+/5 with functional mobility and in sitting  Care Tool Care Tool Bed Mobility Roll left and right activity   Roll left and right assist level: Minimal Assistance - Patient > 75%    Sit to lying activity   Sit to lying assist level: Minimal Assistance - Patient > 75%    Lying to sitting on side of bed activity   Lying to sitting on side of bed assist level: the ability to move from lying on the back to sitting on the side of the bed with no back support.: Moderate Assistance -  Patient 50 - 74%     Care Tool Transfers Sit to stand transfer   Sit to stand assist level: Moderate Assistance - Patient 50 - 74%    Chair/bed transfer   Chair/bed transfer assist level: Moderate Assistance - Patient 50 - 74%     Psychologist, counselling transfer activity did not occur: Safety/medical concerns (decreased activity tolerance)        Care Tool Locomotion Ambulation   Assist level: Moderate Assistance - Patient 50 - 74% Assistive device: Walker-rolling Max distance: 3 ft  Walk 10 feet activity Walk 10 feet activity did not occur: Safety/medical concerns (decreased activity tolerance)       Walk 50 feet  with 2 turns activity Walk 50 feet with 2 turns activity did not occur: Safety/medical concerns      Walk 150 feet activity Walk 150 feet activity did not occur: Safety/medical concerns      Walk 10 feet on uneven surfaces activity Walk 10 feet on uneven surfaces activity did not occur: Safety/medical concerns      Stairs Stair activity did not occur: N/A (was not peroforming steps at baseline, does not plan to perform steps at d/c.)        Walk up/down 1 step activity Walk up/down 1 step or curb (drop down) activity did not occur: N/A      Walk up/down 4 steps activity Walk up/down 4 steps activity did not occur: N/A      Walk up/down 12 steps activity Walk up/down 12 steps activity did not occur: N/A      Pick up small objects from floor   Pick up small object from the floor assist level: Dependent - Patient 0%    Wheelchair Is the patient using a wheelchair?: Yes Type of Wheelchair: Manual   Wheelchair assist level: Dependent - Patient 0% Max wheelchair distance: >150 ft  Wheel 50 feet with 2 turns activity   Assist Level: Dependent - Patient 0%  Wheel 150 feet activity   Assist Level: Dependent - Patient 0%    Refer to Care Plan for Long Term Goals  SHORT TERM GOAL WEEK 1 PT Short Term Goal 1 (Week 1): Patient will  perform bed mobility with supervision consistently. PT Short Term Goal 2 (Week 1): Patient will perform basic transfers with min A using LRAD. PT Short Term Goal 3 (Week 1): Patient will ambulate >50 ft using LRAD with min A.  Recommendations for other services: None   Skilled Therapeutic Intervention Evaluation completed (see details above and below) with education on PT POC and goals and individual treatment initiated with focus on functional mobility/transfers, LE strength, dynamic standing balance/coordination, ambulation, and improved endurance with activity. Patient provided with 18"x18" TIS wheelchair with Roho cushion and adjustments made to promote optimal seating posture and pressure distribution. Patient also provided with RW for use in room and therapist adjusted to proper height for patient.  Patient in bed with his PCA, Manuela Schwartz, in the room upon PT arrival. Patient alert and agreeable to PT session. Patient denied pain, except brief onset of head pain at his L temple that resolved <10 sec x1, during session.  Orthostatic Vitals: Supine: BP 104/60, HR 59  Sitting: BP 97/62, HR 78 (mild "dizziness") Unable to attain BP in standing. Following standing: BP 101/64, HR 82, SPO2: 88% (poor waveform on monitor, 95% <2 min with improved waveform) Vitals stabile throughout remainder of session when assessed intermittently (increased time for good waveform due to poor reading from sensor)  Therapeutic Activity: Bed Mobility: Patient performed rolling R/L with min A and supine to/from sit x2 with mod A for trunk control and lower extremity management. Noted poor sequencing and completion of movement initiated requiring facilitation to bring legs completely off/on the bed and reach the floor with his feet in sitting to prevent posterior bias. Patient sat EO with mod A progressing to supervision x2 with cues for hip flexion and stabilization with B upper and lower extremities to reduce  retropulsion.   Transfers: Patient performed sit to/from stand x1 without AD with mod A, initiated stand pivot with patient turning the opposite direction and placing both hands on the w/c with patient facing the w/c,  patient unable to motor plan completion of turn to sit in the w/c and required max A to return to sitting EOB safely. Patient with increased SOB and cued for pursed lip breathing and returned to lying with prolonged lying rest break as PT retrieved the TIS w/c and adjusted the w/c for the patient He then performed stand pivot bed<>TIS w/c using RW (pt reports he was using this PTA) with min-mod A. Provided verbal cues for hand placement on RW, forward weight shift, and sequencing for turn.  Gait Training:  Patient ambulated 3 feet using RW, limited by decreased activity tolerance with SOB, with mod A for balance and AD management. Ambulated with decreased gait speed, significantly decreased step length and height, narrow BOS, forward trunk lean, and downward head gaze.   Wheelchair Mobility:  Patient propelled TIS wheelchair dependently due to decreased activity tolerance and hx of L shoulder pain with limited ROM.   Neuromuscular Re-ed: Patient demonstrated increased fall risk noted by score of 3/56 on the [facility] Scale.  <45/56 = fall risk, <42/56 = predictive of recurrent falls, <40/56 = 100% fall risk  >41 = independent, 21-40 = assistive device, 0-20 = wheelchair level  MDC 6.9 (4 pts 45-56, 5 pts 35-44, 7 pts 25-34) (ANPTA Core Set of Outcome Measures for Adults with Neurologic Conditions, 2018)  Instructed pt in results of PT evaluation as detailed above, PT POC, rehab potential, rehab goals, and discharge recommendations. Additionally discussed CIR's policies regarding fall safety and use of chair alarm and/or quick release belt. Pt verbalized understanding and in agreement. Will update pt's family members as they become available.   Patient in bed with PCA in the room  at end of session with breaks locked, bed alarm set, 4 rails up per patient request, and all needs within reach.    Discharge Criteria: Patient will be discharged from PT if patient refuses treatment 3 consecutive times without medical reason, if treatment goals not met, if there is a change in medical status, if patient makes no progress towards goals or if patient is discharged from hospital.  The above assessment, treatment plan, treatment alternatives and goals were discussed and mutually agreed upon: No family available/patient unable  Shamirah Ivan L Lerae Langham PT, DPT, NCS, CBIS  04/17/2022, 2:34 PM

## 2022-04-18 ENCOUNTER — Inpatient Hospital Stay (HOSPITAL_COMMUNITY): Payer: Medicare Other

## 2022-04-18 DIAGNOSIS — S069X1S Unspecified intracranial injury with loss of consciousness of 30 minutes or less, sequela: Secondary | ICD-10-CM | POA: Diagnosis not present

## 2022-04-18 DIAGNOSIS — I10 Essential (primary) hypertension: Secondary | ICD-10-CM | POA: Diagnosis not present

## 2022-04-18 DIAGNOSIS — I482 Chronic atrial fibrillation, unspecified: Secondary | ICD-10-CM | POA: Diagnosis not present

## 2022-04-18 DIAGNOSIS — Z96612 Presence of left artificial shoulder joint: Secondary | ICD-10-CM | POA: Diagnosis not present

## 2022-04-18 MED ORDER — ORAL CARE MOUTH RINSE: 15.0000 mL | OROMUCOSAL | Status: DC | PRN

## 2022-04-18 MED ORDER — APIXABAN 5 MG PO TABS
5.0000 mg | ORAL_TABLET | Freq: Two times a day (BID) | ORAL | Status: DC
  Administered 2022-04-18 – 2022-05-05 (×33): 5 mg via ORAL
  Filled 2022-04-18 (×34): qty 1

## 2022-04-18 NOTE — Progress Notes (Deleted)
Speech Language Pathology Daily Session Note  Patient Details  Name: Eugene [name] MRN: [id number] Date of Birth: Aug 13, 1935  Today's Date: 04/18/2022 SLP Individual Time: 3903-0092 SLP Individual Time Calculation (min): 60 min  Short Term Goals: Week 1: SLP Short Term Goal 1 (Week 1): Pt will tolerate regular diet with thin liquids with minimal s/sx conccerning for airway invasion and stable medical status at the Sup A level. - Pt self-administered thin coffee via cup with set-up assistance. Occasional belching post-swallow with cough x 1. No additional clinical s/sx conerning for airway compromise noted; vocal quality remained clear throughout. Per chart review, VSS.   SLP Short Term Goal 2 (Week 1): Pt will participate in various naming tasks with 90% accuracy at the Mod I level. - Pt participated in therapeutic confrontational naming task with ~65% accuracy at the Mod I level; ~80% accuracy given Min-Mod A verbal cues.  SLP Short Term Goal 3 (Week 1): Pt will demonstrate orientation x 4 with Sup A. - Oriented x 4 given Min-Mod A verbal and visual cues to reference external/environmental aids.  SLP Short Term Goal 4 (Week 1): Pt will communicate wants, needs, and ideas ~80% of the time given Min A. - Pt effectively communicated wants, needs, and ideas ~60% of the time given Min A verbal cues.  SLP Short Term Goal 5 (Week 1): Pt will demonstrate increased intellectual and emergent awareness by naming 2 deficits s/p TBI with Mod A. - Pt required Total A for demonstrating intellectual and emergent awareness by naming 2 deficits s/p TBI.  Skilled Therapeutic Interventions: S: Pt seen this date for skilled ST intervention targeting deglutition and cognitive-linguistic goals outlined above. Pt received awake/alert and lying semi-reclined in bed. Continues to endorse intermittent pain in L shoulder. Agreeable to ST intervention at bedside. Repositioned in bed, with help from RN, to optimize  safety for intake of coffee, which pt requested with Min A. Pleasant and participatory though remains confused.  O: Please see above for objective data re: pt's performance on targeted goals. Re: cognition, pt benefited from Max A to maintain sustained attention during therapeutic calendar task (writing numbers 1-30 in calendar). Min-Mod A to slow speech to increase intelligibility with no successful carryover observed; at times, pt only ~50-60% intelligible. Will add goal. Pt continues to exhibit difficulty with verbal expression and is unaware of errors; required Max to Total A to correct. Simplified education provided re: reason for hospitalization, TBI, current speech/language deficits, and ST POC; he will need reinforcement of education given poor insight and generalization.   A: Pt appears somewhat sitmulable for skilled ST intervention. Recommend continuation of current diet consistencies.   P: Pt left in bed with all safety measures activated. Call bell reviewed and within reach and all immediate needs met. Continue per current ST POC next session.    Pain Reports L shoulder pain  Therapy/Group: Individual Therapy  Naiyana Barbian A Tram Wrenn 04/18/2022, 12:03 PM

## 2022-04-18 NOTE — Progress Notes (Signed)
Occupational Therapy TBI Note  Patient Details  Name: Eugene [name] MRN: [id number] Date of Birth: 02/06/1936  Today's Date: 04/18/2022 OT Individual Time: 1005-1047 OT Individual Time Calculation (min): 42 min    Short Term Goals: Week 1:  OT Short Term Goal 1 (Week 1): Pt will complete 2/3 steps of donning pants OT Short Term Goal 2 (Week 1): Pt will complete toilet transfer with LRAD at CGA level OT Short Term Goal 3 (Week 1): Pt will don shirt EOB with no A for sitting balance OT Short Term Goal 4 (Week 1): Pt will bathe with MIN A at sit to stand level  Skilled Therapeutic Interventions/Progress Updates:  Pt received for skilled OT session supine in bed with family present. OT session focused on ADL retraining and OOB activity. Pt with un-rated pain in L-shoulder, OT offering rest breaks as needed and positioning suggestions throughout session to address pain/fatigue and maximize participation/safety in session.   Pt performs sup>EOB bed mobility with increased time for heavy encouragement and Min A due to L-shoulder pain and compromised sitting balance. Heavy encouragement required due to patient's unwillingness for OOB activity despite episode of incontinence and decreased hygiene. Pt stated "it's not that wet" in reference to his brief and "ill take one tomorrow" when given the choice to shower or sponge-bathe. Pt requires Min-Mod A + verbal cues for L lean to move hips towards EOB once seated in preparation for stand-pivot into WC and change shirt. Pt completes STS transfer with Min A + verbal cues for RW management/hand placement to decrease strain on L shoulder.   Once in his w/c, pt requires Mod A+ RW to thread pants and maintain standing balance while donning over hips/bottom. At sink-level, pt performs face washing, oral care, and brushes hair with minimal encouragement (possibly due to pastor arriving for visit) and overall set-up to Min A (brushing back of head).  Pt  remained seated in w/c in off-loading position with visitors present. All other immediate needs meet at end of session. Pt continues to be appropriate for skilled OT intervention to address further functional independence.    Therapy Documentation Precautions:  Precautions Precautions: Fall Restrictions Weight Bearing Restrictions: No   Agitated Behavior Scale: TBI Observation Details Observation Environment: Pt's room Start of observation period - Date: 04/18/22 Start of observation period - Time: 1005 End of observation period - Date: 04/18/22 End of observation period - Time: 1047 Agitated Behavior Scale (DO NOT LEAVE BLANKS) Short attention span, easy distractibility, inability to concentrate: Present to a slight degree Impulsive, impatient, low tolerance for pain or frustration: Present to a slight degree Uncooperative, resistant to care, demanding: Present to a slight degree Violent and/or threatening violence toward people or property: Absent Explosive and/or unpredictable anger: Absent Rocking, rubbing, moaning, or other self-stimulating behavior: Absent Pulling at tubes, restraints, etc.: Absent Wandering from treatment areas: Absent Restlessness, pacing, excessive movement: Absent Repetitive behaviors, motor, and/or verbal: Present to a slight degree Rapid, loud, or excessive talking: Absent Sudden changes of mood: Absent Easily initiated or excessive crying and/or laughter: Absent Self-abusiveness, physical and/or verbal: Absent Agitated behavior scale total score: 18    Therapy/Group: Individual Therapy  Lou Cal, OTR/L, MSOT   04/18/2022, 4:48 PM

## 2022-04-18 NOTE — Progress Notes (Signed)
CT head showed extra-axial fluid collection almost resolved with decreased density of SDH.  Will resume eliquis per NS noted and secure chat w/Dr. Riley Kill.

## 2022-04-18 NOTE — Progress Notes (Signed)
Physical Therapy TBI Note  Patient Details  Name: Eugene [name] MRN: [id number] Date of Birth: 1935-06-08  Today's Date: 04/18/2022 PT Individual Time: 1115-1155 PT Individual Time Calculation (min): 40 min   Short Term Goals: Week 1:  PT Short Term Goal 1 (Week 1): Patient will perform bed mobility with supervision consistently. PT Short Term Goal 2 (Week 1): Patient will perform basic transfers with min A using LRAD. PT Short Term Goal 3 (Week 1): Patient will ambulate >50 ft using LRAD with min A.  Skilled Therapeutic Interventions/Progress Updates:     Patient in the bathroom toileting with NT in the room upon PT arrival. Patient alert and agreeable to PT session. Patient denied pain during session.  Patient's son-in-law and grandson were in the room. Updated family on patient's progress and confirmed baseline cognition and behaviors with them.   Patient oriented to self, required selection of 2 options for location, and total A for situation this session.   Therapeutic Activity: Bed Mobility: Patient performed sit to supine with supervision and increased time. Provided verbal cues for initiation of bringing his legs onto the bed and correcting positioning in the bed. Transfers: Patient performed sit to/from stand from the toilet using the grab bar and RW with supervision-CGA, spontaneously stood from the toilet without alerting PT, but PT within reach of patient. Provided max verbal cues for forward weight shift and reaching back to sit for safety. Patient was continent of bowel and bladder during toileting. Performed peri-care independently with x1 wipe with total A for thoroughness. Performed lower body clothing management with max A with patient initiating pulling his brief and pants up, but strong posterior bias without upper extremity support requiring min-mod A for standing balance. Improved with hands on RW and PT completing clothing management.   Gait Training:  Patient  ambulated ~55 feet using RW with min-mod a for support to prevent posterior bias and intermittent AD management. Ambulated with decreased gait speed, decreased step length and height, increased B knee/hip flexion in stance, forward trunk lean, and downward head gaze. Provided verbal cues for erect posture, spatial awareness, path finding to return to his room, and increased step height for safety.  Patient required increased time for initiation, cuing, rest breaks, and for completion of tasks throughout session. Utilized therapeutic use of self throughout to promote efficiency.   Patient in bed asking for a beer at end of session with breaks locked, bed alarm set, 4 rails up per patient preference, Telesitter in place, and all needs within reach.   Therapy Documentation Precautions:  Precautions Precautions: Fall Restrictions Weight Bearing Restrictions: No  Agitated Behavior Scale: TBI Observation Details Observation Environment: 4 MW Start of observation period - Date: 04/18/22 Start of observation period - Time: 1115 End of observation period - Date: 04/18/22 End of observation period - Time: 1155 Agitated Behavior Scale (DO NOT LEAVE BLANKS) Short attention span, easy distractibility, inability to concentrate: Present to a slight degree Impulsive, impatient, low tolerance for pain or frustration: Present to a slight degree Uncooperative, resistant to care, demanding: Present to a slight degree Violent and/or threatening violence toward people or property: Absent Explosive and/or unpredictable anger: Absent Rocking, rubbing, moaning, or other self-stimulating behavior: Absent Pulling at tubes, restraints, etc.: Absent Wandering from treatment areas: Absent Restlessness, pacing, excessive movement: Absent Repetitive behaviors, motor, and/or verbal: Present to a slight degree Rapid, loud, or excessive talking: Absent Sudden changes of mood: Absent Easily initiated or excessive  crying and/or laughter: Absent Self-abusiveness,  physical and/or verbal: Absent Agitated behavior scale total score: 18   Therapy/Group: Individual Therapy  Denham Mose L Andrus Sharp PT, DPT, NCS, CBIS  04/18/2022, 4:50 PM

## 2022-04-18 NOTE — IPOC Note (Signed)
Overall Plan of Care [facility]) Patient Details Name: Eugene [name] MRN: [id number] DOB: [birth year]  Admitting Diagnosis: TBI (traumatic brain injury) [facility])  Hospital Problems: Principal Problem:   TBI (traumatic brain injury) (HCC)     Functional Problem List: Nursing Bladder, Bowel, Endurance, Medication Management, Safety  PT Balance, Safety, Behavior, Sensory, Edema, Skin Integrity, Endurance, Motor, Nutrition, Pain, Perception  OT Balance, Cognition, Endurance, Motor, Nutrition, Pain, Safety, Vision  SLP Cognition, Linguistic  TR         Basic ADL's: OT Grooming, Bathing, Dressing, Toileting     Advanced  ADL's: OT       Transfers: PT Bed Mobility, Bed to Chair, Car, Occupational psychologist, Research scientist (life sciences): PT Ambulation, Psychologist, prison and probation services, Stairs     Additional Impairments: OT Fuctional Use of Upper Extremity  SLP Swallowing, Communication, Social Cognition expression Social Interaction, Problem Solving, Memory, Awareness, Attention  TR      Anticipated Outcomes Item Anticipated Outcome  Self Feeding nogoal  Swallowing  Sup A   Basic self-care  S  Toileting  S   Bathroom Transfers S  Bowel/Bladder  manage bowel w mod I and bladder w toileting  Transfers  CGA using LRAD  Locomotion  CGA using LRAD >75 ft  Communication  Min A  Cognition  Mod A  Pain  n/a  Safety/Judgment  manage w cues   Therapy Plan: PT Intensity: Minimum of 1-2 x/day ,45 to 90 minutes PT Frequency: 5 out of 7 days PT Duration Estimated Length of Stay: 2 weeks OT Intensity: Minimum of 1-2 x/day, 45 to 90 minutes OT Frequency: 5 out of 7 days OT Duration/Estimated Length of Stay: 12-16 days SLP Intensity: Minumum of 1-2 x/day, 30 to 90 minutes SLP Frequency: 3 to 5 out of 7 days SLP Duration/Estimated Length of Stay: 12-16 days   Team Interventions: Nursing Interventions Patient/Family Education, Bowel Management, Disease Management/Prevention, Bladder  Management, Medication Management, Discharge Planning  PT interventions Ambulation/gait training, Community reintegration, Neuromuscular re-education, DME/adaptive equipment instruction, Psychosocial support, Stair training, UE/LE Strength taining/ROM, Wheelchair propulsion/positioning, UE/LE Coordination activities, Therapeutic Activities, Skin care/wound management, Pain management, Functional electrical stimulation, Discharge planning, Balance/vestibular training, Cognitive remediation/compensation, Disease management/prevention, Functional mobility training, Patient/family education, Splinting/orthotics, Visual/perceptual remediation/compensation, Therapeutic Exercise  OT Interventions Warden/ranger, DME/adaptive equipment instruction, Patient/family education, Therapeutic Activities, Wheelchair propulsion/positioning, Therapeutic Exercise, Cognitive remediation/compensation, Psychosocial support, Functional electrical stimulation, Community reintegration, Functional mobility training, Self Care/advanced ADL retraining, UE/LE Strength taining/ROM, UE/LE Coordination activities, Skin care/wound managment, Neuromuscular re-education, Discharge planning, Disease mangement/prevention, Pain management, Splinting/orthotics, Visual/perceptual remediation/compensation  SLP Interventions Cognitive remediation/compensation, Functional tasks, Dysphagia/aspiration precaution training, Internal/external aids, Patient/family education  TR Interventions    SW/CM Interventions Discharge Planning, Psychosocial Support, Patient/Family Education   Barriers to Discharge MD  Medical stability  Nursing Incontinence 1 level/level entry w family and personal care attendant  PT Lack of/limited family support, Behavior new cognitive deficits with hx of frequent falls in the past year, may need increased assist at d/c compared to PTA  OT      SLP      SW       Team Discharge Planning: Destination:  PT-Home ,OT- Home , SLP-Home Projected Follow-up: PT-Home health PT, OT-  Home health OT, SLP-Home Health SLP, Outpatient SLP, 24 hour supervision/assistance Projected Equipment Needs: PT-Rolling walker with 5" wheels, Wheelchair (measurements), OT- None recommended by OT, SLP-None recommended by SLP Equipment Details: PT- , OT-  Patient/family involved in discharge planning: PT- Patient, Family member/caregiver,  OT-Patient, SLP-Patient, Family member/caregiver  MD ELOS: 12-16 days Medical Rehab Prognosis:  Excellent Assessment: The patient has been admitted for CIR therapies with the diagnosis of TBI. The team will be addressing functional mobility, strength, stamina, balance, safety, adaptive techniques and equipment, self-care, bowel and bladder mgt, patient and caregiver education, pain mgt, NMR, cognition. Goals have been set at sup to [facility] with self-care and mobility, min to mod with cognition. Anticipated discharge destination is home with family.        See Team Conference Notes for weekly updates to the plan of care

## 2022-04-18 NOTE — Progress Notes (Signed)
PROGRESS NOTE   Subjective/Complaints: Seems to have slept a little better last night. Sl confused this morning but able to work his way through his thoughts, issues. Left shoulder pain seems better.  ROS: Limited due to cognitive/behavioral    Objective:   No results found. Recent Labs    04/17/22 0525  WBC 6.2  HGB 12.6*  HCT 36.2*  PLT 269   Recent Labs    04/17/22 0525  NA 136  K 3.5  CL 102  CO2 22  GLUCOSE 97  BUN 14  CREATININE 1.04  CALCIUM 9.0    Intake/Output Summary (Last 24 hours) at 04/18/2022 2774 Last data filed at 04/17/2022 2000 Gross per 24 hour  Intake 297 ml  Output 800 ml  Net -503 ml     Pressure Injury 01/11/22 Coccyx Medial Deep Tissue Pressure Injury - Purple or maroon localized area of discolored intact skin or blood-filled blister due to damage of underlying soft tissue from pressure and/or shear. DTI sacrum (Active)  01/11/22 2235  Location: Coccyx  Location Orientation: Medial  Staging: Deep Tissue Pressure Injury - Purple or maroon localized area of discolored intact skin or blood-filled blister due to damage of underlying soft tissue from pressure and/or shear.  Wound Description (Comments): DTI sacrum  Present on Admission: Yes     Pressure Injury 04/16/22 Buttocks Right Stage 2 -  Partial thickness loss of dermis presenting as a shallow open injury with a red, pink wound bed without slough. (Active)  04/16/22 1837  Location: Buttocks  Location Orientation: Right  Staging: Stage 2 -  Partial thickness loss of dermis presenting as a shallow open injury with a red, pink wound bed without slough.  Wound Description (Comments):   Present on Admission: Yes     Pressure Injury 04/16/22 Penis Anterior Stage 2 -  Partial thickness loss of dermis presenting as a shallow open injury with a red, pink wound bed without slough. Device related pressure injury (Active)  04/16/22 1840   Location: Penis  Location Orientation: Anterior  Staging: Stage 2 -  Partial thickness loss of dermis presenting as a shallow open injury with a red, pink wound bed without slough.  Wound Description (Comments): Device related pressure injury  Present on Admission: Yes    Physical Exam: Vital Signs Blood pressure 139/81, pulse 86, temperature 98.8 F (37.1 C), temperature source Oral, resp. rate 16, height 6' (1.829 m), weight 82.3 kg, SpO2 100 %.  Constitutional: No distress . Vital signs reviewed. HEENT: NCAT, EOMI, oral membranes moist Neck: supple Cardiovascular: RRR without murmur. No JVD    Respiratory/Chest: CTA Bilaterally without wheezes or rales. Normal effort    GI/Abdomen: BS +, non-tender, non-distended Ext: no clubbing, cyanosis, or edema Psych: pleasant and cooperative.  Skin: a few scattered bruises and lacs, hematoma right forearm. Chronic venous changes bilateral LE's. Other wounds as follows            Neuro:  pt alert and oriented x3 with extra time. A little HOH. Occasionally word finding deficits persist at sentence level especially. . Visual fields intact. Reasonable insight and awareness. Followed basic commands. LUE limited by pain in shoulder, otherwise 4/5.  RUE 5/5. LE's 4- prox to 5/5 disally.  Musculoskeletal: left shoulder tender with PROM. Really only has 30-40 degrees for abduct/flex/ext before significant pain.     Assessment/Plan: 1. Functional deficits which require 3+ hours per day of interdisciplinary therapy in a comprehensive inpatient rehab setting. Physiatrist is providing close team supervision and 24 hour management of active medical problems listed below. Physiatrist and rehab team continue to assess barriers to discharge/monitor patient progress toward functional and medical goals  Care Tool:  Bathing    Body parts bathed by patient: Chest, Abdomen, Front perineal area, Right upper leg, Left upper leg, Face   Body parts  bathed by helper: Right arm, Left arm, Buttocks, Right lower leg, Left lower leg     Bathing assist Assist Level: Moderate Assistance - Patient 50 - 74%     Upper Body Dressing/Undressing Upper body dressing   What is the patient wearing?: Pull over shirt    Upper body assist Assist Level: Moderate Assistance - Patient 50 - 74%    Lower Body Dressing/Undressing Lower body dressing      What is the patient wearing?: Pants, Incontinence brief     Lower body assist Assist for lower body dressing: Total Assistance - Patient < 25%     Toileting Toileting    Toileting assist Assist for toileting: Maximal Assistance - Patient 25 - 49%     Transfers Chair/bed transfer  Transfers assist     Chair/bed transfer assist level: Moderate Assistance - Patient 50 - 74%     Locomotion Ambulation   Ambulation assist      Assist level: Moderate Assistance - Patient 50 - 74% Assistive device: Walker-rolling Max distance: 3 ft   Walk 10 feet activity   Assist  Walk 10 feet activity did not occur: Safety/medical concerns (decreased activity tolerance)        Walk 50 feet activity   Assist Walk 50 feet with 2 turns activity did not occur: Safety/medical concerns         Walk 150 feet activity   Assist Walk 150 feet activity did not occur: Safety/medical concerns         Walk 10 feet on uneven surface  activity   Assist Walk 10 feet on uneven surfaces activity did not occur: Safety/medical concerns         Wheelchair     Assist Is the patient using a wheelchair?: Yes Type of Wheelchair: Manual    Wheelchair assist level: Dependent - Patient 0% Max wheelchair distance: >150 ft    Wheelchair 50 feet with 2 turns activity    Assist        Assist Level: Dependent - Patient 0%   Wheelchair 150 feet activity     Assist      Assist Level: Dependent - Patient 0%   Blood pressure 139/81, pulse 86, temperature 98.8 F (37.1 C),  temperature source Oral, resp. rate 16, height 6' (1.829 m), weight 82.3 kg, SpO2 100 %.  Medical Problem List and Plan: 1. Functional deficits secondary to Left temporal contusion             -patient may  shower             -ELOS/Goals: 12-14d, minA/Sup   -Continue CIR therapies including PT, OT, and SLP  2.  Antithrombotics: -DVT/anticoagulation:  Mechanical: Sequential compression devices, below knee Bilateral lower extremities             -antiplatelet therapy: N/a 3. Pain  Management: left shoulder pain seems better today. Still limited with ROM.   -  kpad  -scheduled tylenol 4. Mood/Behavior/Sleep: LCSW to follow for evaluation and support.             --Continue melatonin with Trazodone prn for insomnia.             -antipsychotic agents: Continue Zyprexa. continue thru weekend 5. Neuropsych/cognition: This patient is not quite capable of making decisions on his own behalf. 6. Skin/Wound Care: Routine pressure relief measures.  to multiple wounds  -left forearm wound healing, no cellulitis there 7. Fluids/Electrolytes/Nutrition: Monitor I/O.   -encourage PO -labs reasonable this morning -protein supp added for low albumin  8. CAF: Monitor HR TID--continue to hold Eliquis.              --CT head ordered for today 11/22 9. H/o GERD: Continue Pepcid--resume PPI. 10. HTN: Monitor BP TID--on nimotop thru 12/05. Continue Lopressor.   11/21 bp controlled 11. Left moderate pleural effusion/Emphysema per X rays: Flutter valve added for pulmonary hygiene.   -add IS also 12. Oral secretions: Continue scopolamine patch.   13. Seizure Hx: Keppra resumed as he was on this previously 14. Hx AAA, control BP    LOS: 2 days A FACE TO FACE EVALUATION WAS PERFORMED  Ranelle Oyster 04/18/2022, 8:08 AM

## 2022-04-18 NOTE — Progress Notes (Signed)
Speech Language Pathology TBI Note  Patient Details  Name: Eugene [name] MRN: [id number] Date of Birth: [birth year]  Today's Date: 04/18/2022 SLP Individual Time: 2956-2130 SLP Individual Time Calculation (min): 60 min  Short Term Goals: Week 1: SLP Short Term Goal 1 (Week 1): Pt will tolerate regular diet with thin liquids with minimal s/sx conccerning for airway invasion and stable medical status at the Sup A level. - Pt self-administered thin coffee via cup with set-up assistance. Occasional belching post-swallow with cough x 1. No additional clinical s/sx conerning for airway compromise noted; vocal quality remained clear throughout. Per chart review, VSS.    SLP Short Term Goal 2 (Week 1): Pt will participate in various naming tasks with 90% accuracy at the Mod I level. - Pt participated in therapeutic confrontational naming task with ~65% accuracy at the Mod I level; ~80% accuracy given Min-Mod A verbal cues.   SLP Short Term Goal 3 (Week 1): Pt will demonstrate orientation x 4 with Sup A. - Oriented x 4 given Min-Mod A verbal and visual cues to reference external/environmental aids.   SLP Short Term Goal 4 (Week 1): Pt will communicate wants, needs, and ideas ~80% of the time given Min A. - Pt effectively communicated wants, needs, and ideas ~60% of the time given Min A verbal cues.   SLP Short Term Goal 5 (Week 1): Pt will demonstrate increased intellectual and emergent awareness by naming 2 deficits s/p TBI with Mod A. - Pt required Total A for demonstrating intellectual and emergent awareness by naming 2 deficits s/p TBI.   Skilled Therapeutic Interventions: S: Pt seen this date for skilled ST intervention targeting deglutition and cognitive-linguistic goals outlined above. Pt received awake/alert and lying semi-reclined in bed. Continues to endorse intermittent pain in L shoulder. Agreeable to ST intervention at bedside. Repositioned in bed, with help from RN, to optimize safety  for intake of coffee, which pt requested with Min A. Pleasant and participatory though remains confused.   O: Please see above for objective data re: pt's performance on targeted goals. Re: cognition, pt benefited from Max A to maintain sustained attention during therapeutic calendar task (writing numbers 1-30 in calendar). Min-Mod A to slow speech to increase intelligibility with no successful carryover observed; at times, pt only ~50-60% intelligible. Will add goal. Pt continues to exhibit difficulty with verbal expression and is unaware of errors; required Max to Total A to correct. Simplified education provided re: reason for hospitalization, TBI, current speech/language deficits, and ST POC; he will need reinforcement of education given poor insight and generalization.    A: Pt appears somewhat sitmulable for skilled ST intervention. Recommend continuation of current diet consistencies.    P: Pt left in bed with all safety measures activated. Call bell reviewed and within reach and all immediate needs met. Continue per current ST POC next session.   Pain Reports L shoulder pain; distraction and repositioning offered as pain management interventions.   Therapy/Group: Individual Therapy  Agitated Behavior Scale: TBI Observation Details Observation Environment: pt's room Start of observation period - Date: 04/18/22 Start of observation period - Time: 0815 End of observation period - Date: 04/18/22 End of observation period - Time: 0915 Agitated Behavior Scale (DO NOT LEAVE BLANKS) Short attention span, easy distractibility, inability to concentrate: Present to a slight degree Impulsive, impatient, low tolerance for pain or frustration: Present to a slight degree Uncooperative, resistant to care, demanding: Absent Violent and/or threatening violence toward people or property: Absent Explosive and/or  unpredictable anger: Absent Rocking, rubbing, moaning, or other self-stimulating behavior:  Absent Pulling at tubes, restraints, etc.: Absent Wandering from treatment areas: Absent Restlessness, pacing, excessive movement: Absent Repetitive behaviors, motor, and/or verbal: Absent Rapid, loud, or excessive talking: Absent Sudden changes of mood: Absent Easily initiated or excessive crying and/or laughter: Absent Self-abusiveness, physical and/or verbal: Absent Agitated behavior scale total score: 16  Therapy/Group: Individual Therapy  Teea Ducey A Caedence Snowden 04/18/2022, 12:21 PM

## 2022-04-18 NOTE — Progress Notes (Signed)
Occupational Therapy TBI Note  Patient Details  Name: LUAN MABERRY MRN: [id number] Date of Birth: 1935-09-20  Today's Date: 04/18/2022 OT Individual Time: 1300-1400 OT Individual Time Calculation (min): 60 min   Short Term Goals: Week 1:  OT Short Term Goal 1 (Week 1): Pt will complete 2/3 steps of donning pants OT Short Term Goal 2 (Week 1): Pt will complete toilet transfer with LRAD at CGA level OT Short Term Goal 3 (Week 1): Pt will don shirt EOB with no A for sitting balance OT Short Term Goal 4 (Week 1): Pt will bathe with MIN A at sit to stand level  Skilled Therapeutic Interventions/Progress Updates:    Session 1: Pt received in bed with unrated pain in L ribs and shoulder. Rest and repositioning provided for pain relief  ADL: Pt slow to warm to intervention but eventually agreeable to toileting. Pt needs min A mostly 1 LOB lateral L/posterior needing MOD A to recover. Pt attempts to stand for urine void, about able to hold unsupported standing with S~25 seconds. Pt then directed to TIS and taken to tx gym.  Therapeutic activity Pt completes transfer with RW ~10 feet to EOM with MIN A. Pt stands to play 2 rounds of horseshoes with OT reaching in min ranges outside BOS with RUE to throw. Pt completes 300 feet of functional mobility in hallway with MIN A to faciliate increased lateral weight shifting for improved stride with RW. Pt needs ~5 standing rest breaks but declines sitting.   Pt left at end of session in recliner with exit alarm on, call light in reach and all needs met   Therapy Documentation Precautions:  Precautions Precautions: Fall Restrictions Weight Bearing Restrictions: No General:      Agitated Behavior Scale: TBI  Observation Details Observation Environment: CIR Start of observation period - Date: 04/18/22 Start of observation period - Time: 1300 End of observation period - Date: 04/18/22 End of observation period - Time: 1400 Agitated  Behavior Scale (DO NOT LEAVE BLANKS) Short attention span, easy distractibility, inability to concentrate: Present to a slight degree Impulsive, impatient, low tolerance for pain or frustration: Present to a slight degree Uncooperative, resistant to care, demanding: Present to a slight degree Violent and/or threatening violence toward people or property: Absent Explosive and/or unpredictable anger: Absent Rocking, rubbing, moaning, or other self-stimulating behavior: Absent Pulling at tubes, restraints, etc.: Absent Wandering from treatment areas: Absent Restlessness, pacing, excessive movement: Present to a slight degree Repetitive behaviors, motor, and/or verbal: Present to a slight degree Rapid, loud, or excessive talking: Absent Sudden changes of mood: Absent Easily initiated or excessive crying and/or laughter: Absent Self-abusiveness, physical and/or verbal: Absent Agitated behavior scale total score: 19   Therapy/Group: Individual Therapy  [name] 04/18/2022, 2:24 PM

## 2022-04-19 DIAGNOSIS — S069X4D Unspecified intracranial injury with loss of consciousness of 6 hours to 24 hours, subsequent encounter: Secondary | ICD-10-CM

## 2022-04-19 MED ORDER — OLANZAPINE 5 MG PO TABS
5.0000 mg | ORAL_TABLET | Freq: Every day | ORAL | Status: DC
  Administered 2022-04-19 – 2022-05-01 (×13): 5 mg via ORAL
  Filled 2022-04-19 (×13): qty 1

## 2022-04-19 NOTE — Progress Notes (Signed)
Patient restless throughout the shift. Patient slept only about 1 hour total this shift. Alert and oriented x1-2, impulsive ,poor attention, follows 1 step commands. Patient calling out names at times, rambling speech.

## 2022-04-19 NOTE — Progress Notes (Signed)
PROGRESS NOTE   Subjective/Complaints:  Pt rambling more and didn't sleep well overnight- per nursing, slept ~ 1 hour early in night after trazodone, but not the rest of night.   Per pt, not in hospital- at a meeting- Per nursing, LBM yesterday.    ROS: limited due to behavior/cognition  Objective:   CT HEAD WO CONTRAST ( )  Result Date: 04/18/2022 CLINICAL DATA:  Follow-up subdural hemorrhage EXAM: CT HEAD WITHOUT CONTRAST TECHNIQUE: Contiguous axial images were obtained from the base of the skull through the vertex without intravenous contrast. RADIATION DOSE REDUCTION: This exam was performed according to the departmental dose-optimization program which includes automated exposure control, adjustment of the mA and/or kV according to patient size and/or use of iterative reconstruction technique. COMPARISON:  CT head 04/11/2022 FINDINGS: Brain: There is ongoing expected evolution of the hemorrhagic contusion in the left temporal lobe with overall decreased density of the blood and mild surrounding edema. Extra-axial blood overlying the temporal lobe is decreased in volume, essentially resolved. Subdural hemorrhage layering along the falx posteriorly, left tentorial leaflet, and overlying the occipital lobe measuring up to 4 mm in thickness is similar in size but also decreased in density. There is no new acute intracranial hemorrhage, extra-axial fluid collection, or acute infarct Parenchymal volume is stable. The ventricles are stable in size. Background chronic small-vessel ischemic change is stable. There is no mass lesion.  There is no midline shift. Vascular: No hyperdense vessel or unexpected calcification. Skull: Normal. Negative for fracture or focal lesion. Sinuses/Orbits: The imaged paranasal sinuses are clear. Bilateral lens implants are in place. The globes and orbits are otherwise unremarkable. Other: None. IMPRESSION: 1.  Expected evolution of the left temporal lobe intraparenchymal hematoma with essentially resolved overlying extra-axial hemorrhage. 2. Similar size but decreased density of the subdural blood layering along the falx/tentorial leaflet and over the left occipital lobe. 3. No new acute intracranial pathology. Electronically Signed   By: Lesia Hausen M.D.   On: 04/18/2022 08:46   Recent Labs    04/17/22 0525  WBC 6.2  HGB 12.6*  HCT 36.2*  PLT 269   Recent Labs    04/17/22 0525  NA 136  K 3.5  CL 102  CO2 22  GLUCOSE 97  BUN 14  CREATININE 1.04  CALCIUM 9.0    Intake/Output Summary (Last 24 hours) at 04/19/2022 3212 Last data filed at 04/18/2022 1319 Gross per 24 hour  Intake 117 ml  Output --  Net 117 ml     Pressure Injury 04/16/22 Buttocks Right Stage 2 -  Partial thickness loss of dermis presenting as a shallow open injury with a red, pink wound bed without slough. (Active)  04/16/22 1837  Location: Buttocks  Location Orientation: Right  Staging: Stage 2 -  Partial thickness loss of dermis presenting as a shallow open injury with a red, pink wound bed without slough.  Wound Description (Comments):   Present on Admission: Yes     Pressure Injury 04/16/22 Penis Anterior Stage 2 -  Partial thickness loss of dermis presenting as a shallow open injury with a red, pink wound bed without slough. Device related pressure injury (Active)  04/16/22 1840  Location: Penis  Location Orientation: Anterior  Staging: Stage 2 -  Partial thickness loss of dermis presenting as a shallow open injury with a red, pink wound bed without slough.  Wound Description (Comments): Device related pressure injury  Present on Admission: Yes    Physical Exam: Vital Signs Blood pressure 139/67, pulse 92, temperature 97.8 F (36.6 C), temperature source Oral, resp. rate 20, height 6' (1.829 m), weight 82 kg, SpO2 98 %.    General: awake, alert, inappropriate, waving hands around in air, fidgety, but  not agitated;   NAD HENT: conjugate gaze; oropharynx moist CV: regular rate; no JVD Pulmonary: CTA B/L; no W/R/R- good air movement GI: soft, NT, ND, (+)BS Psychiatric: confused as per Neuro- not agitated Neurological: confused; thought was at meeting, but recognized in hospital once cued that was in hospital- rambling/garbled speech Ext: no clubbing, cyanosis, or edema Psych: pleasant and cooperative.  Skin: a few scattered bruises and lacs, hematoma right forearm. Chronic venous changes bilateral LE's. Other wounds as follows            Neuro:  pt alert and oriented x3 with extra time. A little HOH. Occasionally word finding deficits persist at sentence level especially. . Visual fields intact. Reasonable insight and awareness. Followed basic commands. LUE limited by pain in shoulder, otherwise 4/5. RUE 5/5. LE's 4- prox to 5/5 disally.  Musculoskeletal: left shoulder tender with PROM. Really only has 30-40 degrees for abduct/flex/ext before significant pain.     Assessment/Plan: 1. Functional deficits which require 3+ hours per day of interdisciplinary therapy in a comprehensive inpatient rehab setting. Physiatrist is providing close team supervision and 24 hour management of active medical problems listed below. Physiatrist and rehab team continue to assess barriers to discharge/monitor patient progress toward functional and medical goals  Care Tool:  Bathing    Body parts bathed by patient: Chest, Abdomen, Front perineal area, Right upper leg, Left upper leg, Face   Body parts bathed by helper: Right arm, Left arm, Buttocks, Right lower leg, Left lower leg     Bathing assist Assist Level: Moderate Assistance - Patient 50 - 74%     Upper Body Dressing/Undressing Upper body dressing   What is the patient wearing?: Pull over shirt    Upper body assist Assist Level: Moderate Assistance - Patient 50 - 74%    Lower Body Dressing/Undressing Lower body dressing       What is the patient wearing?: Pants, Incontinence brief     Lower body assist Assist for lower body dressing: Total Assistance - Patient < 25%     Toileting Toileting    Toileting assist Assist for toileting: Maximal Assistance - Patient 25 - 49%     Transfers Chair/bed transfer  Transfers assist  Chair/bed transfer activity did not occur: Safety/medical concerns  Chair/bed transfer assist level: Moderate Assistance - Patient 50 - 74%     Locomotion Ambulation   Ambulation assist      Assist level: Moderate Assistance - Patient 50 - 74% Assistive device: Walker-rolling Max distance: 3 ft   Walk 10 feet activity   Assist  Walk 10 feet activity did not occur: Safety/medical concerns (decreased activity tolerance)        Walk 50 feet activity   Assist Walk 50 feet with 2 turns activity did not occur: Safety/medical concerns         Walk 150 feet activity   Assist Walk 150 feet activity did not occur: Safety/medical concerns  Walk 10 feet on uneven surface  activity   Assist Walk 10 feet on uneven surfaces activity did not occur: Safety/medical concerns         Wheelchair     Assist Is the patient using a wheelchair?: Yes Type of Wheelchair: Manual    Wheelchair assist level: Dependent - Patient 0% Max wheelchair distance: >150 ft    Wheelchair 50 feet with 2 turns activity    Assist        Assist Level: Dependent - Patient 0%   Wheelchair 150 feet activity     Assist      Assist Level: Dependent - Patient 0%   Blood pressure 139/67, pulse 92, temperature 97.8 F (36.6 C), temperature source Oral, resp. rate 20, height 6' (1.829 m), weight 82 kg, SpO2 98 %.  Medical Problem List and Plan: 1. Functional deficits secondary to Left temporal contusion             -patient may  shower             -ELOS/Goals: 12-14d, minA/Sup   -Continue CIR therapies including PT, OT, and SLP  2.   Antithrombotics: -DVT/anticoagulation:  Mechanical: Sequential compression devices, below knee Bilateral lower extremities 11.23- Eliquis restarted after head CT- per NSU             -antiplatelet therapy: N/a 3. Pain Management: left shoulder pain seems better today. Still limited with ROM.   -  kpad  -scheduled tylenol 4. Mood/Behavior/Sleep: LCSW to follow for evaluation and support.             --Continue melatonin with Trazodone prn for insomnia.             -antipsychotic agents: Continue Zyprexa. continue thru weekend  11/23- didn't sleep overnight- maybe 1 hour per nursing- will increase Zyprexa to 5 mg QHS- still very confused 5. Neuropsych/cognition: This patient is not quite capable of making decisions on his own behalf. 6. Skin/Wound Care: Routine pressure relief measures.  to multiple wounds  -left forearm wound healing, no cellulitis there 7. Fluids/Electrolytes/Nutrition: Monitor I/O.   -encourage PO -labs reasonable this morning -protein supp added for low albumin  8. CAF: Monitor HR TID--continue to hold Eliquis.              --CT head ordered for today 11/22  11/23- looking better and Eliquis restarted per NSU 9. H/o GERD: Continue Pepcid--resume PPI. 10. HTN: Monitor BP TID--on nimotop thru 12/05. Continue Lopressor.   11/21 bp controlled 11. Left moderate pleural effusion/Emphysema per X rays: Flutter valve added for pulmonary hygiene.   -add IS also 12. Oral secretions: Continue scopolamine patch.   13. Seizure Hx: Keppra resumed as he was on this previously 14. Hx AAA, control BP    I spent a total of 36   minutes on total care today- >50% coordination of care- due to prolonged d/w nursing.   LOS: 3 days A FACE TO FACE EVALUATION WAS PERFORMED  Christne Platts 04/19/2022, 8:34 AM

## 2022-04-19 NOTE — Progress Notes (Signed)
Patient ID: Eugene [name], male   DOB: 25-Nov-1935, 86 y.o.   MRN: [id number]  SW made efforts to complete assessment but pt sleeping. SW will continue to make efforts to complete assessment.   Cecile Sheerer, MSW, LCSWA Office: [phone] Cell: [phone] Fax: [phone]

## 2022-04-20 DIAGNOSIS — S069X4D Unspecified intracranial injury with loss of consciousness of 6 hours to 24 hours, subsequent encounter: Secondary | ICD-10-CM | POA: Diagnosis not present

## 2022-04-20 MED ORDER — METOPROLOL TARTRATE 25 MG PO TABS
25.0000 mg | ORAL_TABLET | Freq: Two times a day (BID) | ORAL | Status: DC
  Administered 2022-04-20 – 2022-05-05 (×29): 25 mg via ORAL
  Filled 2022-04-20 (×30): qty 1

## 2022-04-20 NOTE — Progress Notes (Signed)
Orthopedic Tech Progress Note Patient Details:  Eugene [name] 12-Jun-1935 [id number]  Ortho Devices Type of Ortho Device: Abdominal binder Ortho Device/Splint Interventions: Ordered   Post Interventions Instructions Provided: Care of device, Adjustment of device  [place] A Gerilyn Pilgrim 04/20/2022, 12:15 PM

## 2022-04-20 NOTE — Progress Notes (Signed)
Occupational Therapy TBI Note  Patient Details  Name: Eugene [name] MRN: [id number] Date of Birth: 1935/11/13  Today's Date: 04/20/2022 OT Missed Time: 75 Minutes Missed Time Reason: Patient fatigue;Other (comment) (low blood pressure)   Short Term Goals: Week 1:  OT Short Term Goal 1 (Week 1): Pt will complete 2/3 steps of donning pants OT Short Term Goal 2 (Week 1): Pt will complete toilet transfer with LRAD at CGA level OT Short Term Goal 3 (Week 1): Pt will don shirt EOB with no A for sitting balance OT Short Term Goal 4 (Week 1): Pt will bathe with MIN A at sit to stand level  Skilled Therapeutic Interventions/Progress Updates:    Upon entering room, pt sound asleep. Tried to wake patient by speaking loudly and rubbing his arms.  Pt unable to wake up. PT reported pt had very low blood pressure this am so they had put him back to bed.  RN reported they were waiting on an abdominal binder.  He slept last night but only had 1 hour the previous night.  Continued to have pt sleep at this time.    Therapy Documentation Precautions:  Precautions Precautions: Fall Restrictions Weight Bearing Restrictions: No    Vital Signs: Therapy Vitals Temp: 98.5 F (36.9 C) Temp Source: Oral Pulse Rate: 95 Resp: 18 BP: (Abnormal) 152/84 Patient Position (if appropriate): Lying Oxygen Therapy SpO2: 95 % O2 Device: Room Air Pain:  No response, pt asleep Agitated Behavior Scale: TBI    N/a - pt did not wake for therapy  ADL: ADL Eating: Supervision/safety Where Assessed-Eating: Chair Grooming: Minimal assistance Where Assessed-Grooming: Standing at sink Upper Body Bathing: Moderate assistance Where Assessed-Upper Body Bathing: Edge of bed Lower Body Bathing: Moderate assistance Where Assessed-Lower Body Bathing: Edge of bed Upper Body Dressing: Moderate assistance Where Assessed-Upper Body Dressing: Edge of bed Lower Body Dressing: Maximal assistance Where Assessed-Lower  Body Dressing: Edge of bed Toileting: Maximal assistance Where Assessed-Toileting: Bedside Commode Toilet Transfer: Moderate assistance Toilet Transfer Method: Stand pivot   Therapy/Group: Individual Therapy  [name] 04/20/2022, 8:27 AM

## 2022-04-20 NOTE — Progress Notes (Signed)
PROGRESS NOTE   Subjective/Complaints:  Pt more able to discuss where he is today- still rambling some, however doing better than yesterday AM.   Feels like needs to have BM per pt.    ROS: limited by cognition Objective:   No results found. No results for input(s): "WBC", "HGB", "HCT", "PLT" in the last 72 hours.  No results for input(s): "NA", "K", "CL", "CO2", "GLUCOSE", "BUN", "CREATININE", "CALCIUM" in the last 72 hours.   Intake/Output Summary (Last 24 hours) at 04/20/2022 1050 Last data filed at 04/20/2022 0700 Gross per 24 hour  Intake 458 ml  Output --  Net 458 ml     Pressure Injury 04/16/22 Buttocks Right Stage 2 -  Partial thickness loss of dermis presenting as a shallow open injury with a red, pink wound bed without slough. (Active)  04/16/22 1837  Location: Buttocks  Location Orientation: Right  Staging: Stage 2 -  Partial thickness loss of dermis presenting as a shallow open injury with a red, pink wound bed without slough.  Wound Description (Comments):   Present on Admission: Yes     Pressure Injury 04/16/22 Penis Anterior Stage 2 -  Partial thickness loss of dermis presenting as a shallow open injury with a red, pink wound bed without slough. Device related pressure injury (Active)  04/16/22 1840  Location: Penis  Location Orientation: Anterior  Staging: Stage 2 -  Partial thickness loss of dermis presenting as a shallow open injury with a red, pink wound bed without slough.  Wound Description (Comments): Device related pressure injury  Present on Admission: Yes    Physical Exam: Vital Signs Blood pressure (!) 152/84, pulse 95, temperature 98.5 F (36.9 C), temperature source Oral, resp. rate 18, height 6' (1.829 m), weight 78.8 kg, SpO2 95 %.     General: awake, alert, but sleepy- wants to be to leave room so he can sleep;  NAD HENT: conjugate gaze; oropharynx a little dry CV: regular  rate; no JVD Pulmonary: CTA B/L; no W/R/R- good air movement GI: soft, NT, ND, (+)BS- slightly hyperactive Psychiatric: appropriate- flat Neurological: Ox1-2- doesn't know why here, but with cues, knew in hospital Ext: no clubbing, cyanosis, or edema Psych: pleasant and cooperative.  Skin: a few scattered bruises and lacs, hematoma right forearm. Chronic venous changes bilateral LE's. Other wounds as follows            Neuro:  pt alert and oriented x3 with extra time. A little HOH. Occasionally word finding deficits persist at sentence level especially. . Visual fields intact. Reasonable insight and awareness. Followed basic commands. LUE limited by pain in shoulder, otherwise 4/5. RUE 5/5. LE's 4- prox to 5/5 disally.  Musculoskeletal: left shoulder tender with PROM. Really only has 30-40 degrees for abduct/flex/ext before significant pain.     Assessment/Plan: 1. Functional deficits which require 3+ hours per day of interdisciplinary therapy in a comprehensive inpatient rehab setting. Physiatrist is providing close team supervision and 24 hour management of active medical problems listed below. Physiatrist and rehab team continue to assess barriers to discharge/monitor patient progress toward functional and medical goals  Care Tool:  Bathing    Body parts bathed by  patient: Chest, Abdomen, Front perineal area, Right upper leg, Left upper leg, Face   Body parts bathed by helper: Right arm, Left arm, Buttocks, Right lower leg, Left lower leg     Bathing assist Assist Level: Moderate Assistance - Patient 50 - 74%     Upper Body Dressing/Undressing Upper body dressing   What is the patient wearing?: Pull over shirt    Upper body assist Assist Level: Moderate Assistance - Patient 50 - 74%    Lower Body Dressing/Undressing Lower body dressing      What is the patient wearing?: Pants, Incontinence brief     Lower body assist Assist for lower body dressing: Total  Assistance - Patient < 25%     Toileting Toileting    Toileting assist Assist for toileting: Maximal Assistance - Patient 25 - 49%     Transfers Chair/bed transfer  Transfers assist  Chair/bed transfer activity did not occur: Safety/medical concerns  Chair/bed transfer assist level: Moderate Assistance - Patient 50 - 74%     Locomotion Ambulation   Ambulation assist      Assist level: Moderate Assistance - Patient 50 - 74% Assistive device: Walker-rolling Max distance: 3 ft   Walk 10 feet activity   Assist  Walk 10 feet activity did not occur: Safety/medical concerns (decreased activity tolerance)        Walk 50 feet activity   Assist Walk 50 feet with 2 turns activity did not occur: Safety/medical concerns         Walk 150 feet activity   Assist Walk 150 feet activity did not occur: Safety/medical concerns         Walk 10 feet on uneven surface  activity   Assist Walk 10 feet on uneven surfaces activity did not occur: Safety/medical concerns         Wheelchair     Assist Is the patient using a wheelchair?: Yes Type of Wheelchair: Manual    Wheelchair assist level: Dependent - Patient 0% Max wheelchair distance: >150 ft    Wheelchair 50 feet with 2 turns activity    Assist        Assist Level: Dependent - Patient 0%   Wheelchair 150 feet activity     Assist      Assist Level: Dependent - Patient 0%   Blood pressure (!) 152/84, pulse 95, temperature 98.5 F (36.9 C), temperature source Oral, resp. rate 18, height 6' (1.829 m), weight 78.8 kg, SpO2 95 %.  Medical Problem List and Plan: 1. Functional deficits secondary to Left temporal contusion             -patient may  shower             -ELOS/Goals: 12-14d, minA/Sup   -Con't CIR- PT, OT and SLP 2.  Antithrombotics: -DVT/anticoagulation:  Mechanical: Sequential compression devices, below knee Bilateral lower extremities 11.23- Eliquis restarted after head  CT- per NSU             -antiplatelet therapy: N/a 3. Pain Management: left shoulder pain seems better today. Still limited with ROM.   -  kpad  -scheduled tylenol 4. Mood/Behavior/Sleep: LCSW to follow for evaluation and support.             --Continue melatonin with Trazodone prn for insomnia.             -antipsychotic agents: Continue Zyprexa. continue thru weekend  11/23- didn't sleep overnight- maybe 1 hour per nursing- will increase Zyprexa to 5  mg QHS- still very confused  11/24- slept MUCH better- will con't Zyprexa dose 5. Neuropsych/cognition: This patient is not quite capable of making decisions on his own behalf. 6. Skin/Wound Care: Routine pressure relief measures.  to multiple wounds  -left forearm wound healing, no cellulitis there 7. Fluids/Electrolytes/Nutrition: Monitor I/O.   -encourage PO -labs reasonable this morning -protein supp added for low albumin  8. CAF: Monitor HR TID--continue to hold Eliquis.              --CT head ordered for today 11/22  11/23- looking better and Eliquis restarted per NSU 9. H/o GERD: Continue Pepcid--resume PPI. 10. HTN: Monitor BP TID--on nimotop thru 12/05. Continue Lopressor.   11/21 bp controlled  11/24- BP too low with sitting/standing- but was 152 systolic this AM- will reduce Metoprolol but might need midodrine instead 11. Left moderate pleural effusion/Emphysema per X rays: Flutter valve added for pulmonary hygiene.   -add IS also 12. Oral secretions: Continue scopolamine patch.   13. Seizure Hx: Keppra resumed as he was on this previously 14. Hx AAA, control BP  15. Orthostatic hypotension  11/24- will decrease Metoprolol to 25 mg BID before adding Midodrine- BP was 64/47 standing today- down from 95/54   I spent a total of 36   minutes on total care today- >50% coordination of care- due to  Prolonged d/w nursing about BP as well as sleep-  LOS: 4 days A FACE TO FACE EVALUATION WAS PERFORMED  Shaima Sardinas 04/20/2022, 10:50 AM

## 2022-04-20 NOTE — Progress Notes (Signed)
Patient sleep chart completed. Patient asleep about 9pm after medications. Patient incontinent x 1. No impulsive behaviors noted this shift.

## 2022-04-20 NOTE — Progress Notes (Signed)
Speech Language Pathology TBI Note  Patient Details  Name: Eugene [name] MRN: [id number] Date of Birth: [birth year]  Today's Date: 04/20/2022 SLP Individual Time: 1345-1425 SLP Individual Time Calculation (min): 40 min  Short Term Goals: Week 1: SLP Short Term Goal 1 (Week 1): Pt will tolerate regular diet with thin liquids with minimal s/sx conccerning for airway invasion and stable medical status at the Sup A level. SLP Short Term Goal 2 (Week 1): Pt will participate in various naming tasks with 90% accuracy at the Mod I level. SLP Short Term Goal 3 (Week 1): Pt will demonstrate orientation x 4 with Sup A. SLP Short Term Goal 4 (Week 1): Pt will communicate wants, needs, and ideas ~80% of the time given Min A. SLP Short Term Goal 5 (Week 1): Pt will demonstrate increased intellectual and emergent awareness by naming 2 deficits s/p TBI with Mod A. SLP Short Term Goal 6 (Week 1): Pt will increase speech intelligibility to be 90% at the sentence level given Min A.  Skilled Therapeutic Interventions: Skilled treatment session focused on communication goals. SLP facilitated session by providing overall supervision level verbal cues for naming of functional items. Patient also sequenced 4-step picture cards with Min verbal cues and was able to verbally describe the actions within the pictures with Mod A verbal cues to self-monitor and correct errors. Patient with intermittent phonemic paraphasias, semantic paraphasias, and neologisms. Patient's family present and pleased with patient's overall progress with functional communication. Patient left upright in bed with alarm on and all needs within reach. Continue with current plan of care.      Pain No/Denies Pain  Agitated Behavior Scale: TBI  ABS discontinued d/t ABS score less than 20 for the last three days or no behaviors present   Therapy/Group: Individual Therapy  Lawrencia Mauney 04/20/2022, 3:39 PM

## 2022-04-20 NOTE — Progress Notes (Signed)
Physical Therapy Session Note  Patient Details  Name: Eugene [name] MRN: [id number] Date of Birth: 02-05-1936  Today's Date: 04/20/2022 PT Individual Time: 0908-1003 PT Individual Time Calculation (min): 55 min   Short Term Goals: Week 1:  PT Short Term Goal 1 (Week 1): Patient will perform bed mobility with supervision consistently. PT Short Term Goal 2 (Week 1): Patient will perform basic transfers with min A using LRAD. PT Short Term Goal 3 (Week 1): Patient will ambulate >50 ft using LRAD with min A.  Skilled Therapeutic Interventions/Progress Updates:   Pt received supine in bed and agreeable to PT with encouragement. Pt oriented to person, location, and situation upon questioning from PT. Supine>sit transfer with min assist at trunk on the R shoulder. Pt reports mild L shoulder pain intermittently throughout session. Sit<>stand to doff soiled brief and donn clean pants and brief with min assist from elevated bed height. PT performed clothing management while PT standing with supervision assist and BUE supported on RW. Stand pivot transfer to [facility] with min assist from PT. Pt transported to orthogym in [place]. VS assessed in sitting 104/57. HR 97. Sit>stand with CGA and BUE supported on RW. Unable to attain PB in standing; pt reports mild increase in dizziness. PT applied ted hose while pt in sitting. Sitting BP 91/58. HR 102. Standing BP 64/47 HR 96. Pt reports increasining dizziness and noted increased facial pallor. Pt returned to room and performed ambulatory transfer to bed with RW and CGA. Sit>supine completed with mod assist and BLE management. PT performed additional BP assessment supine in bed 95/54 HR 102. RN  notified. Pt performed scoot to [facility] with mod assist then left supine in bed with call bell in reach and all needs met.  .       Therapy Documentation Precautions:  Precautions Precautions: Fall Restrictions Weight Bearing Restrictions: No General: PT Amount of Missed  Time (min): 20 Minutes PT Missed Treatment Reason: Patient fatigue   Pain: Pain Assessment Pain Scale: 0-10 Pain Score: 8  Pain Location: Back Pain Orientation: Mid Pain Intervention(s): Medication (See eMAR)     Therapy/Group: Individual Therapy  Lorie Phenix 04/20/2022, 10:11 AM

## 2022-04-20 NOTE — Progress Notes (Signed)
  Pt BP decreased during therapy. Pt became symptomatic. Pt wearing teds at time of activity. Pt placed back into bed. Mylo Red, LPN     93/90/30 0923 04/20/22 0959  Orthostatic Lying   BP- Lying  --  95/54  Pulse- Lying  --  102  Orthostatic Sitting  BP- Sitting 104/57 91/58  Pulse- Sitting 97 102  Orthostatic Standing at 0 minutes  BP- Standing at 0 minutes  --  (!) 64/47  Pulse- Standing at 0 minutes  --  96

## 2022-04-21 DIAGNOSIS — S069X4S Unspecified intracranial injury with loss of consciousness of 6 hours to 24 hours, sequela: Secondary | ICD-10-CM | POA: Diagnosis not present

## 2022-04-21 MED ORDER — MELATONIN 3 MG PO TABS: 3.0000 mg | ORAL_TABLET | Freq: Every evening | ORAL | Status: DC | PRN

## 2022-04-21 NOTE — Progress Notes (Signed)
Occupational Therapy TBI Note  Patient Details  Name: Eugene [name] MRN: [id number] Date of Birth: Sep 01, 1935  Today's Date: 04/21/2022 OT Individual Time: 5883-2549 OT Individual Time Calculation (min): 47 min  and Today's Date: 04/21/2022 OT Missed Time: 13 Minutes Missed Time Reason: Patient fatigue   Short Term Goals: Week 1:  OT Short Term Goal 1 (Week 1): Pt will complete 2/3 steps of donning pants OT Short Term Goal 2 (Week 1): Pt will complete toilet transfer with LRAD at CGA level OT Short Term Goal 3 (Week 1): Pt will don shirt EOB with no A for sitting balance OT Short Term Goal 4 (Week 1): Pt will bathe with MIN A at sit to stand level  Skilled Therapeutic Interventions/Progress Updates:     Pt received in bed with shoulder and back pain that was premedicated with tylenol. Rest breaks provided for pain relief. Initially pt declining any tx, but agreeable to checking BP and educated on need to get orthostatic vitals to see if BP was dropping. Pt begrudingly agrees.   ADL: Pt completes ADL at overall MIN A for donning shirt EOB and MAX A for donning pants EOB. MIN A sit to stand from EOB elevated to get othrostatics and pull pants past hips. Attempted education around [facility] management (compressive devices, building tolerance ot upright) to convince pt to sit in the recliner or TIS, however pt declines and requests to lay back down. 4 scoots towards HOB with MIN A.  BP throughotu session as follows: 115/67 semi fowlers 103/61 EOB 107/70 EOB with abdominal binder 91/55 standing abd binder 101/67 EOB with abd binder Pt missed 13 min skilled OT d/t fatigue. Will follow up to make up missed time per POC.   Pt left at end of session in bed with exit alarm on, call light in reach and all needs met   Therapy Documentation Precautions:  Precautions Precautions: Fall Restrictions Weight Bearing Restrictions: No General:   Therapy/Group: Individual Therapy  [name] 04/21/2022, 6:51 AM

## 2022-04-21 NOTE — Progress Notes (Signed)
PROGRESS NOTE   Subjective/Complaints:  No acute complaints. No events overnight. Patient does report poor sleep, cannot express why, however per sleep log and nursing report slept well throughout night (note; did not get trazodone last night)  ROS: limited by cognition  Objective:   No results found. No results for input(s): "WBC", "HGB", "HCT", "PLT" in the last 72 hours.  No results for input(s): "NA", "K", "CL", "CO2", "GLUCOSE", "BUN", "CREATININE", "CALCIUM" in the last 72 hours.   Intake/Output Summary (Last 24 hours) at 04/21/2022 1525 Last data filed at 04/21/2022 1351 Gross per 24 hour  Intake 640 ml  Output 650 ml  Net -10 ml      Pressure Injury 04/16/22 Buttocks Right Stage 2 -  Partial thickness loss of dermis presenting as a shallow open injury with a red, pink wound bed without slough. (Active)  04/16/22 1837  Location: Buttocks  Location Orientation: Right  Staging: Stage 2 -  Partial thickness loss of dermis presenting as a shallow open injury with a red, pink wound bed without slough.  Wound Description (Comments):   Present on Admission: Yes     Pressure Injury 04/16/22 Penis Anterior Stage 2 -  Partial thickness loss of dermis presenting as a shallow open injury with a red, pink wound bed without slough. Device related pressure injury (Active)  04/16/22 1840  Location: Penis  Location Orientation: Anterior  Staging: Stage 2 -  Partial thickness loss of dermis presenting as a shallow open injury with a red, pink wound bed without slough.  Wound Description (Comments): Device related pressure injury  Present on Admission: Yes    Physical Exam: Vital Signs Blood pressure 130/78, pulse 83, temperature (!) 97.5 F (36.4 C), temperature source Oral, resp. rate 19, height 6' (1.829 m), weight 78.8 kg, SpO2 95 %.   Constitution: Appropriate appearance for age. No apparnet distress  HEENT: PERRL,  EOMI grossly intact. +HOH Resp: CTAB. No rales, rhonchi, or wheezing. Cardio: RRR. No mumurs, rubs, or gallops. No peripheral edema. Abdomen: Nondistended. Nontender. +bowel sounds. Psych: Appropriate mood and affect. Neuro: AAOx2; oriented to hospital, not cone.   MSK: Moving all 4 limbs antigravity    PE from prior encounter:  General: awake, alert, but sleepy- wants to be to leave room so he can sleep;  NAD HENT: conjugate gaze; oropharynx a little dry CV: regular rate; no JVD Pulmonary: CTA B/L; no W/R/R- good air movement GI: soft, NT, ND, (+)BS- slightly hyperactive Psychiatric: appropriate- flat Neurological: Ox1-2- doesn't know why here, but with cues, knew in hospital Ext: no clubbing, cyanosis, or edema Psych: pleasant and cooperative.  Skin: a few scattered bruises and lacs, hematoma right forearm. Chronic venous changes bilateral LE's. Other wounds as follows            Neuro:  pt alert and oriented x3 with extra time. A little HOH. Occasionally word finding deficits persist at sentence level especially. . Visual fields intact. Reasonable insight and awareness. Followed basic commands. LUE limited by pain in shoulder, otherwise 4/5. RUE 5/5. LE's 4- prox to 5/5 disally.  Musculoskeletal: left shoulder tender with PROM. Really only has 30-40 degrees for abduct/flex/ext before significant pain.  Assessment/Plan: 1. Functional deficits which require 3+ hours per day of interdisciplinary therapy in a comprehensive inpatient rehab setting. Physiatrist is providing close team supervision and 24 hour management of active medical problems listed below. Physiatrist and rehab team continue to assess barriers to discharge/monitor patient progress toward functional and medical goals  Care Tool:  Bathing    Body parts bathed by patient: Chest, Abdomen, Front perineal area, Right upper leg, Left upper leg, Face   Body parts bathed by helper: Right arm, Left arm,  Buttocks, Right lower leg, Left lower leg     Bathing assist Assist Level: Moderate Assistance - Patient 50 - 74%     Upper Body Dressing/Undressing Upper body dressing   What is the patient wearing?: Pull over shirt    Upper body assist Assist Level: Moderate Assistance - Patient 50 - 74%    Lower Body Dressing/Undressing Lower body dressing      What is the patient wearing?: Pants, Incontinence brief     Lower body assist Assist for lower body dressing: Total Assistance - Patient < 25%     Toileting Toileting    Toileting assist Assist for toileting: Maximal Assistance - Patient 25 - 49%     Transfers Chair/bed transfer  Transfers assist  Chair/bed transfer activity did not occur: Safety/medical concerns  Chair/bed transfer assist level: Moderate Assistance - Patient 50 - 74%     Locomotion Ambulation   Ambulation assist      Assist level: Moderate Assistance - Patient 50 - 74% Assistive device: Walker-rolling Max distance: 3 ft   Walk 10 feet activity   Assist  Walk 10 feet activity did not occur: Safety/medical concerns (decreased activity tolerance)        Walk 50 feet activity   Assist Walk 50 feet with 2 turns activity did not occur: Safety/medical concerns         Walk 150 feet activity   Assist Walk 150 feet activity did not occur: Safety/medical concerns         Walk 10 feet on uneven surface  activity   Assist Walk 10 feet on uneven surfaces activity did not occur: Safety/medical concerns         Wheelchair     Assist Is the patient using a wheelchair?: Yes Type of Wheelchair: Manual    Wheelchair assist level: Dependent - Patient 0% Max wheelchair distance: >150 ft    Wheelchair 50 feet with 2 turns activity    Assist        Assist Level: Dependent - Patient 0%   Wheelchair 150 feet activity     Assist      Assist Level: Dependent - Patient 0%   Blood pressure 130/78, pulse 83,  temperature (!) 97.5 F (36.4 C), temperature source Oral, resp. rate 19, height 6' (1.829 m), weight 78.8 kg, SpO2 95 %.  Medical Problem List and Plan: 1. Functional deficits secondary to Left temporal contusion             -patient may  shower             -ELOS/Goals: 12-14d, minA/Sup   -Con't CIR- PT, OT and SLP 2.  Antithrombotics: -DVT/anticoagulation:  Mechanical: Sequential compression devices, below knee Bilateral lower extremities 11.23- Eliquis restarted after head CT- per NSU             -antiplatelet therapy: N/a 3. Pain Management: left shoulder pain seems better today. Still limited with ROM.   -  kpad  -  scheduled tylenol 4. Mood/Behavior/Sleep: LCSW to follow for evaluation and support.             --Continue melatonin with Trazodone prn for insomnia.             -antipsychotic agents: Continue Zyprexa. continue thru weekend  11/23- didn't sleep overnight- maybe 1 hour per nursing- will increase Zyprexa to 5 mg QHS- still very confused  11/24- slept MUCH better- will con't Zyprexa dose  11/25 - sleep log appropriate; no changes  5. Neuropsych/cognition: This patient is not quite capable of making decisions on his own behalf. 6. Skin/Wound Care: Routine pressure relief measures.  to multiple wounds  -left forearm wound healing, no cellulitis there 7. Fluids/Electrolytes/Nutrition: Monitor I/O.   -encourage PO -labs reasonable this morning -protein supp added for low albumin  8. CAF: Monitor HR TID--continue to hold Eliquis.              --CT head ordered for today 11/22  11/23- looking better and Eliquis restarted per NSU  9. H/o GERD: Continue Pepcid--resume PPI. 10. HTN: Monitor BP TID--on nimotop thru 12/05. Continue Lopressor.   11/21 bp controlled  11/24- BP too low with sitting/standing- but was 152 systolic this AM- will reduce Metoprolol but might need midodrine instead 11. Left moderate pleural effusion/Emphysema per X rays: Flutter valve added for  pulmonary hygiene.   -add IS also 12. Oral secretions: Continue scopolamine patch.   13. Seizure Hx: Keppra resumed as he was on this previously 14. Hx AAA, control BP  15. Orthostatic hypotension  11/24- will decrease Metoprolol to 25 mg BID before adding Midodrine- BP was 64/47 standing today- down from 95/54  11/25: BP 130-170 recently; no further report orthostasis; monitor  I spent a total of 36   minutes on total care today- >50% coordination of care- due to  Prolonged d/w nursing about BP as well as sleep-  LOS: 5 days A FACE TO FACE EVALUATION WAS PERFORMED  Angelina Sheriff 04/21/2022, 3:25 PM

## 2022-04-21 NOTE — Progress Notes (Signed)
Speech Language Pathology TBI Note  Patient Details  Name: Eugene [name] MRN: [id number] Date of Birth: 09-30-1935  Today's Date: 04/21/2022 SLP Individual Time: 3154-0086 SLP Individual Time Calculation (min): 40 min  Short Term Goals: Week 1: SLP Short Term Goal 1 (Week 1): Pt will tolerate regular diet with thin liquids with minimal s/sx conccerning for airway invasion and stable medical status at the Sup A level. SLP Short Term Goal 2 (Week 1): Pt will participate in various naming tasks with 90% accuracy at the Mod I level. SLP Short Term Goal 3 (Week 1): Pt will demonstrate orientation x 4 with Sup A. SLP Short Term Goal 4 (Week 1): Pt will communicate wants, needs, and ideas ~80% of the time given Min A. SLP Short Term Goal 5 (Week 1): Pt will demonstrate increased intellectual and emergent awareness by naming 2 deficits s/p TBI with Mod A. SLP Short Term Goal 6 (Week 1): Pt will increase speech intelligibility to be 90% at the sentence level given Min A.  Skilled Therapeutic Interventions: Skilled treatment session focused on cognitive-linguistic goals. Upon arrival, patient was awake in bed. Patient observed consuming medications whole with thin without overt s/s of aspiration noted. SLP facilitated session by creating larger external aids to maximize recall of orientation information which patient was able to utilize with overall supervision level verbal cues. Mod verbal cues were needed for recall of information after a 5 minute delay. Patient with improved word-finding and improved awareness of phonemic errors while verbalizing at the sentence level during an informal conversation resulting in poor frustration tolerance at times. Patient also with improved safety awareness by asking the SLP at end of session, "what do I do if I have to pee since I can't go by myself?" SLP educated on use of urinal and/or call bell to request assistance. Patient verbalized understanding but will need  reinforcement. Patient left upright in bed with alarm on and all needs within reach. Continue with current plan of care.      Pain Pain Assessment Pain Scale: 0-10 Pain Score: 0-No pain  Agitated Behavior Scale: TBI Observation Details Observation Environment: CIR Start of observation period - Date: 04/21/22 Start of observation period - Time: 0845 End of observation period - Date: 04/21/22 End of observation period - Time: 0925 Agitated Behavior Scale (DO NOT LEAVE BLANKS) Short attention span, easy distractibility, inability to concentrate: Present to a slight degree Impulsive, impatient, low tolerance for pain or frustration: Present to a slight degree Uncooperative, resistant to care, demanding: Absent Violent and/or threatening violence toward people or property: Absent Explosive and/or unpredictable anger: Absent Rocking, rubbing, moaning, or other self-stimulating behavior: Absent Pulling at tubes, restraints, etc.: Absent Wandering from treatment areas: Absent Restlessness, pacing, excessive movement: Absent Repetitive behaviors, motor, and/or verbal: Absent Rapid, loud, or excessive talking: Absent Sudden changes of mood: Absent Easily initiated or excessive crying and/or laughter: Absent Self-abusiveness, physical and/or verbal: Absent Agitated behavior scale total score: 16  Therapy/Group: Individual Therapy  Vanya Carberry 04/21/2022, 11:42 AM

## 2022-04-21 NOTE — Progress Notes (Addendum)
Physical Therapy Session Note  Patient Details  Name: Eugene [name] MRN: [id number] Date of Birth: 11/30/1935  Today's Date: 04/21/2022 PT Individual Time: 1115-1140 and 1400-1415 PT Individual Time Calculation (min): 25 min and 15 min  Short Term Goals: Week 1:  PT Short Term Goal 1 (Week 1): Patient will perform bed mobility with supervision consistently. PT Short Term Goal 2 (Week 1): Patient will perform basic transfers with min A using LRAD. PT Short Term Goal 3 (Week 1): Patient will ambulate >50 ft using LRAD with min A.  Skilled Therapeutic Interventions/Progress Updates:     Session 1: Patient in bed asleep upon PT arrival. Patient easily aroused reporting increased fatigue today. MD rounded discussed sleep challenges at home. Patient oriented to self, location, month and year, mod cues for situation and reported today was Friday. Patient denied pain during session. Patient declined OOB mobility at this time due to fatigue. Offered toielting, bed level exercise, and repositioning. Patient declined all interventions. PT provided education on TBI symptoms and management. Patient denied headaches, dizziness, and visual changes, endorsed increased fatigue, intermittent confusion/disorientation, and poor frustration tolerance. Provided education on management of symptoms and benefits of increased mobility to improve cerebral blood flow and energy levels, as well as work on balance and fall prevention strategies. Patient continue to report fatigue, but agreeable to PT returning this afternoon to make up missed time. Patient in bed at end of session with breaks locked, bed alarm set, and all needs within reach.   Session 2: Patient in bed with a caregiver/friend at bedside upon PT arrival. Patient alert and declined PT session this afternoon. Patient denied pain and continues to report increased fatigue today. Upon further questioning patient endorses symptoms of depression, reporting lack  of interest in working to get better and concerns of continued falls and functional decline at home. Patient stated his wife was his motivation and she passed ~1.5 years ago. Stating it has been "harder to find a reason to get up in the morning." Educated on emotional lability and decreased emotional regulation following TBI that may also be contributing to his current emotional state. Patient agreeable to PT discussing these symptoms with the medical team, RN and MD made aware, and receptive to working with a neuropsychologist during his stay. Patient continued to decline mobility, toileting, bed level exercises, or any other interventions today despite encouragement. Patient agreeable to try again tomorrow and appreciative of discussion and education provided during the session. Patient in bed with caregiver/friend at bedside at end of session with breaks locked, bed alarm set, and all needs within reach.   Therapy Documentation Precautions:  Precautions Precautions: Fall Restrictions Weight Bearing Restrictions: No    Therapy/Group: Individual Therapy  Sheri Prows L Diann Bangerter PT, DPT, NCS, CBIS  04/21/2022, 12:04 PM

## 2022-04-22 NOTE — Progress Notes (Addendum)
Physical Therapy Session Note  Patient Details  Name: Eugene [name] MRN: [id number] Date of Birth: 1936/05/09  Today's Date: 04/22/2022 PT Individual Time: 1150-1220 PT Individual Time Calculation (min): 30 min   Short Term Goals: Week 1:  PT Short Term Goal 1 (Week 1): Patient will perform bed mobility with supervision consistently. PT Short Term Goal 2 (Week 1): Patient will perform basic transfers with min A using LRAD. PT Short Term Goal 3 (Week 1): Patient will ambulate >50 ft using LRAD with min A.  Skilled Therapeutic Interventions/Progress Updates:     Initiated unscheduled therapy session to make-up missed time from yesterday. Patient ambulating in the hall with NT upon PT arrival. Patient agreeable to PT initiating session. Patient reported 8/10 Eugene hip and shoulder pain during session, RN made aware and provided pain medicine. PT provided repositioning, rest breaks, and distraction as pain interventions throughout session.   NT reports patient got up to shower and dress with her this morning and appeared restless following and NT initiated ambulation to manage patient's restlessness in the room. Patient in good spirits today outside of pain with mobility. Patient ambulated a total of 252 feet with NT and PT with x1 standing rest break using the RW with CGA. Ambulated with decreased gait speed, decreased step length and height, mild Eugene antalgic gait, forward trunk lean, and downward head gaze. Provided verbal cues for looking ahead and visual scanning with task for patient to locate his room number. Patient unable to recall his room number, but able to use strategies using the other door signs to locate the room when his room number was told to him. Required mod cues for identifying and using strategies to locate his room. Patient complained on Eugene hip and shoulder pain following ambulation and RN provided pain meds, see details above.   Vitals: Sitting: BP 96/80, HR 61  Standing: BP  93/72, HR 94  Patient with B TED hose and abdominal binder donned prior to session. Patient doffed abdominal binder due to discomfort. Vitals assessed without binder donned. Patient denied symptoms throughout session. Performed sit to/from stand with CGA using RW. Provided cues for hand placement and safe use of RW during transfer.   Patient oriented to self, location, pointed at sign on the wall for situation, and oriented to time. Discussed hx of falls with injury and fall prevention at d/c. Patient is uncomfortable with anyone being in the room with him while he sleeps, as the last 2 falls resulting in hospitalization occurred at night when he got up to go to the bathroom. Patient reports using the urinal before, but prefers to go to the bathroom. Will discuss strategies with patient and family, when available, to determine most effective and safe plan for night time toileting to prevent falls. Patient in agreement and does recognize that he does not want to fall and that he is a risk for falls due to his reduced balance.   Patient in TIS w/c with a visitor arriving at end of session with breaks locked, seat belt alarm set, and all needs within reach.   Therapy Documentation Precautions:  Precautions Precautions: Fall Restrictions Weight Bearing Restrictions: No    Therapy/Group: Individual Therapy  Eugene [name] Eugene [name] PT, DPT, NCS, CBIS  04/22/2022, 12:51 PM

## 2022-04-23 DIAGNOSIS — I482 Chronic atrial fibrillation, unspecified: Secondary | ICD-10-CM | POA: Diagnosis not present

## 2022-04-23 DIAGNOSIS — S069X1S Unspecified intracranial injury with loss of consciousness of 30 minutes or less, sequela: Secondary | ICD-10-CM | POA: Diagnosis not present

## 2022-04-23 DIAGNOSIS — Z96612 Presence of left artificial shoulder joint: Secondary | ICD-10-CM | POA: Diagnosis not present

## 2022-04-23 DIAGNOSIS — I10 Essential (primary) hypertension: Secondary | ICD-10-CM | POA: Diagnosis not present

## 2022-04-23 LAB — BASIC METABOLIC PANEL
Anion gap: 11 (ref 5–15)
BUN: 27 mg/dL — ABNORMAL HIGH (ref 8–23)
CO2: 18 mmol/L — ABNORMAL LOW (ref 22–32)
Calcium: 9.2 mg/dL (ref 8.9–10.3)
Chloride: 110 mmol/L (ref 98–111)
Creatinine, Ser: 1.24 mg/dL (ref 0.61–1.24)
GFR, Estimated: 57 mL/min — ABNORMAL LOW (ref >60)
Glucose, Bld: 95 mg/dL (ref 70–99)
Potassium: 3.4 mmol/L — ABNORMAL LOW (ref 3.5–5.1)
Sodium: 139 mmol/L (ref 135–145)

## 2022-04-23 LAB — CBC
HCT: 36.2 % — ABNORMAL LOW (ref 39.0–52.0)
Hemoglobin: 12.4 g/dL — ABNORMAL LOW (ref 13.0–17.0)
MCH: 30.6 pg (ref 26.0–34.0)
MCHC: 34.3 g/dL (ref 30.0–36.0)
MCV: 89.4 fL (ref 80.0–100.0)
Platelets: 315 10*3/uL (ref 150–400)
RBC: 4.05 MIL/uL — ABNORMAL LOW (ref 4.22–5.81)
RDW: 14.6 % (ref 11.5–15.5)
WBC: 5.1 10*3/uL (ref 4.0–10.5)
nRBC: 0 % (ref 0.0–0.2)

## 2022-04-23 MED ORDER — DICLOFENAC SODIUM 1 % EX GEL: 2.0000 g | Freq: Four times a day (QID) | CUTANEOUS | Status: DC

## 2022-04-23 NOTE — Progress Notes (Signed)
Speech Language Pathology TBI Note  Patient Details  Name: Eugene [name] MRN: [id number] Date of Birth: 29-May-1935  Today's Date: 04/23/2022 SLP Individual Time: 4696-2952 SLP Individual Time Calculation (min): 43 min  Short Term Goals: Week 1: SLP Short Term Goal 1 (Week 1): Pt will tolerate regular diet with thin liquids with minimal s/sx conccerning for airway invasion and stable medical status at the Sup A level. SLP Short Term Goal 2 (Week 1): Pt will participate in various naming tasks with 90% accuracy at the Mod I level. SLP Short Term Goal 3 (Week 1): Pt will demonstrate orientation x 4 with Sup A. SLP Short Term Goal 4 (Week 1): Pt will communicate wants, needs, and ideas ~80% of the time given Min A. SLP Short Term Goal 5 (Week 1): Pt will demonstrate increased intellectual and emergent awareness by naming 2 deficits s/p TBI with Mod A. SLP Short Term Goal 6 (Week 1): Pt will increase speech intelligibility to be 90% at the sentence level given Min A.  Skilled Therapeutic Interventions: Skilled treatment session focused on dysphagia and cognitive goals. SLP facilitated session by providing skilled observation with breakfast meal of regular textures with thin liquids. Patient consumed meal without overt s/s of aspiration with the exception of one coughing episode with thin liquids, suspect due to large bolus via cup. Recommend patient continue current diet. Patient with decreased frustration tolerance today, especially when discussing orientation information and discharge plans. Patient verbalizing at the phrase level with increased neologisms noted. Patient handed off to PT. Continue with current plan of care.      Pain Intermittent pain when rolling   Agitated Behavior Scale: TBI Observation Details Observation Environment: [facility] Start of observation period - Date: 04/23/22 Start of observation period - Time: 0730 End of observation period - Date: 04/23/22 End of  observation period - Time: 0815 Agitated Behavior Scale (DO NOT LEAVE BLANKS) Short attention span, easy distractibility, inability to concentrate: Present to a slight degree Impulsive, impatient, low tolerance for pain or frustration: Present to a slight degree Uncooperative, resistant to care, demanding: Absent Violent and/or threatening violence toward people or property: Absent Explosive and/or unpredictable anger: Absent Rocking, rubbing, moaning, or other self-stimulating behavior: Absent Pulling at tubes, restraints, etc.: Absent Wandering from treatment areas: Absent Restlessness, pacing, excessive movement: Absent Repetitive behaviors, motor, and/or verbal: Absent Rapid, loud, or excessive talking: Absent Sudden changes of mood: Absent Easily initiated or excessive crying and/or laughter: Absent Self-abusiveness, physical and/or verbal: Absent Agitated behavior scale total score: 16  Therapy/Group: Individual Therapy  Geena Weinhold 04/23/2022, 3:22 PM

## 2022-04-23 NOTE — Progress Notes (Addendum)
Occupational Therapy Session Note  Patient Details  Name: Eugene [name] MRN: [id number] Date of Birth: Jan 11, 1936  Today's Date: 04/23/2022 OT Individual Time: 11:00-11:20 OT Individual Time Calculation: 20 min OT Missed Time: 40 minutes Missed Time Reason: Pain, refulsal  Short Term Goals: Week 1:  OT Short Term Goal 1 (Week 1): Pt will complete 2/3 steps of donning pants OT Short Term Goal 2 (Week 1): Pt will complete toilet transfer with LRAD at CGA level OT Short Term Goal 3 (Week 1): Pt will don shirt EOB with no A for sitting balance OT Short Term Goal 4 (Week 1): Pt will bathe with MIN A at sit to stand level  Skilled Therapeutic Interventions/Progress Updates:    Upon OT arrival, pt semi recumbent in bed reporting 8/10 pain in L shoulder and R hip. Pt reports being medicated with tylenol and cream applied to shoulder and hip an hour ago. Pt's daughter arrived at bedside at beginning of OT attempt. Pt declines to participate in therapy the rest of the day. Therapist provided education on benefits of engaging in OT services and offered alternatives. Therapist and daughter provide encouragement to participate and pt continues to decline. Pt was left in bed with safety measures in place and LPN notified. Pt missed 40 minutes of OT time and will make up minutes as able.   Therapy Documentation Precautions:  Precautions Precautions: Fall Restrictions Weight Bearing Restrictions: No General: General OT Amount of Missed Time: 60 Minutes   Therapy/Group: Individual Therapy  Carolin Sicks 04/23/2022, 11:29 AM

## 2022-04-23 NOTE — Progress Notes (Signed)
Physical Therapy Session Note  Patient Details  Name: Eugene [name] MRN: [id number] Date of Birth: [birth year]  Today's Date: 04/23/2022 PT Individual Time: 3729-0211 and 1300-1345 PT Individual Time Calculation (min): 35 min and 45 min  Short Term Goals: Week 1:  PT Short Term Goal 1 (Week 1): Patient will perform bed mobility with supervision consistently. PT Short Term Goal 2 (Week 1): Patient will perform basic transfers with min A using LRAD. PT Short Term Goal 3 (Week 1): Patient will ambulate >50 ft using LRAD with min A.  Skilled Therapeutic Interventions/Progress Updates:     Session 1: Patient in bed upon PT arrival. Patient alert and reluctant to PT session. Patient reports disrupted sleep and increased "emotional and physical" fatigue today. Donned B TED hose as MD rounded on patient. Patient oriented to self, location, and situation, needed min cuing for month, year, and day of the week. Continues with intermittent language of confusion and word finding deficits.   Patient agreeable to sit EOB to don pants. Initiated R log roll to progress to sitting, patient with sudden onset of intense R hip pain with patient crying out and quickly returning to supine. Patient reported 8/10 L hip and shoulder pain with mobility, RN made aware. PT provided repositioning, rest breaks, and distraction as pain interventions throughout session.   Attempted to perform L hip AAROM to assess pain pattern and reduce joint stiffness, patient unable to tolerate with minimal ROM achieved for hip flexion, abduction, or adduction. Attempted to don pants bed level, again patient unable to tolerate rolling or bridging due to elevated L hip and shoulder pain at this time.   Discussed pain with patient, reports previous R hip fx, reviewed chart with hip fx in Oct 2022. Patient reports intermittent pain at home PTA with worse pain since most recent fall onto his L side, per previous discussion with one of the  patient's caregivers that was present at the time of the fall. RN and MD made aware of patient's elevated pain levels and limitations with mobility tolerance.   Patient declined further intervention at this time due to pain with mobility and fatigue. Patient missed 15 min of skilled PT due to pain/fatigue, RN made aware. Will attempt to make-up missed time as able.    Patient in bed at end of session with breaks locked, bed alarm set, and all needs within reach.   Session 2: Patient in TIS w/c upon PT arrival. Patient alert and agreeable to PT session. Patient reported 5/10 L hip pain during session, RN made aware. PT provided repositioning, rest breaks, and distraction as pain interventions throughout session.   Attempted to motivate patient with patient's preferred music, 1950's rock per patient's caregiver. Transported patient to the day room for improved mood and affect outside of the room. Patient agreeable to mobility, however, attempted sit to/from stand x2 with max A with patient actively resisting standing each trial, stating "I just can't right now."   NT reports that the patient agreed to perform toileting and dressing with NT following declining mobility with both PT and OT this morning. Reports he performed mobility and in room ambulation with min A-CGA  using RW. Spent increased time discussing this with the patient, limited by communication and cognitive deficits. Overall, patient describes fear of falling and concern of falling with PT due to therapist's "stature." NT agreeable to assist as +2 for patient confidence and motivation from a NT's rapport with the patient. Patient then stood with min  A using the RW and ambulated >150 feet with CGA-min A of 1 and CGA-SBA from NT. Ambulated with decreased gait speed, decreased step length and height, mild L antalgic gait, forward trunk lean, and downward head gaze. Provided verbal cues for looking ahead and visual scanning.  Patient performed  sit to supine with CGA with increased time and cues for initiation.   Patient required increased time for initiation, cuing, rest breaks, and for completion of tasks throughout session. Utilized therapeutic use of self throughout to promote efficiency.   Patient in bed with visitors in the room at end of session with breaks locked, bed alarm set, and all needs within reach.   Therapy Documentation Precautions:  Precautions Precautions: Fall Restrictions Weight Bearing Restrictions: No    Therapy/Group: Individual Therapy  Isiah Scheel L Camaron Cammack PT, DPT, NCS, CBIS  04/23/2022, 7:07 PM

## 2022-04-23 NOTE — Progress Notes (Signed)
PROGRESS NOTE   Subjective/Complaints:  Pt still says he doesn't sleep real well. No problems reported by nursing. Working with PT when I came in today  ROS: Patient denies fever, rash, sore throat, blurred vision, dizziness, nausea, vomiting, diarrhea, cough, shortness of breath or chest pain,  headache, or mood change.   Objective:   No results found. Recent Labs    04/23/22 0547  WBC 5.1  HGB 12.4*  HCT 36.2*  PLT 315    Recent Labs    04/23/22 0547  NA 139  K 3.4*  CL 110  CO2 18*  GLUCOSE 95  BUN 27*  CREATININE 1.24  CALCIUM 9.2     Intake/Output Summary (Last 24 hours) at 04/23/2022 1025 Last data filed at 04/23/2022 0801 Gross per 24 hour  Intake 1040 ml  Output 150 ml  Net 890 ml     Pressure Injury 04/16/22 Buttocks Right Stage 2 -  Partial thickness loss of dermis presenting as a shallow open injury with a red, pink wound bed without slough. (Active)  04/16/22 1837  Location: Buttocks  Location Orientation: Right  Staging: Stage 2 -  Partial thickness loss of dermis presenting as a shallow open injury with a red, pink wound bed without slough.  Wound Description (Comments):   Present on Admission: Yes     Pressure Injury 04/16/22 Penis Anterior Stage 2 -  Partial thickness loss of dermis presenting as a shallow open injury with a red, pink wound bed without slough. Device related pressure injury (Active)  04/16/22 1840  Location: Penis  Location Orientation: Anterior  Staging: Stage 2 -  Partial thickness loss of dermis presenting as a shallow open injury with a red, pink wound bed without slough.  Wound Description (Comments): Device related pressure injury  Present on Admission: Yes    Physical Exam: Vital Signs Blood pressure 124/79, pulse 92, temperature 98.3 F (36.8 C), temperature source Oral, resp. rate 18, height 6' (1.829 m), weight 78.2 kg, SpO2 94 %.   Constitutional: No  distress . Vital signs reviewed. HEENT: NCAT, EOMI, oral membranes moist Neck: supple Cardiovascular: RRR without murmur. No JVD    Respiratory/Chest: CTA Bilaterally without wheezes or rales. Normal effort    GI/Abdomen: BS +, non-tender, non-distended Ext: no clubbing, cyanosis, or edema Psych: pleasant and cooperative  Neuro: AAOx2; oriented to hospital, not cone.  Told me it was Monday but couldn't remember the  month. Moves all 4  but LUE limited d/t pain MSK: Moving all 4 limbs antigravity but limited with ROM left shoulder Skin: a few scattered bruises and lacs, hematoma right forearm. Chronic venous changes bilateral LE's. Other wounds healing/resolving.     Assessment/Plan: 1. Functional deficits which require 3+ hours per day of interdisciplinary therapy in a comprehensive inpatient rehab setting. Physiatrist is providing close team supervision and 24 hour management of active medical problems listed below. Physiatrist and rehab team continue to assess barriers to discharge/monitor patient progress toward functional and medical goals  Care Tool:  Bathing    Body parts bathed by patient: Chest, Abdomen, Front perineal area, Right upper leg, Left upper leg, Face   Body parts bathed by helper:  Right arm, Left arm, Buttocks, Right lower leg, Left lower leg     Bathing assist Assist Level: Moderate Assistance - Patient 50 - 74%     Upper Body Dressing/Undressing Upper body dressing   What is the patient wearing?: Pull over shirt    Upper body assist Assist Level: Moderate Assistance - Patient 50 - 74%    Lower Body Dressing/Undressing Lower body dressing      What is the patient wearing?: Pants, Incontinence brief     Lower body assist Assist for lower body dressing: Total Assistance - Patient < 25%     Toileting Toileting    Toileting assist Assist for toileting: Maximal Assistance - Patient 25 - 49%     Transfers Chair/bed transfer  Transfers assist   Chair/bed transfer activity did not occur: Safety/medical concerns  Chair/bed transfer assist level: Moderate Assistance - Patient 50 - 74%     Locomotion Ambulation   Ambulation assist      Assist level: Moderate Assistance - Patient 50 - 74% Assistive device: Walker-rolling Max distance: 3 ft   Walk 10 feet activity   Assist  Walk 10 feet activity did not occur: Safety/medical concerns (decreased activity tolerance)        Walk 50 feet activity   Assist Walk 50 feet with 2 turns activity did not occur: Safety/medical concerns         Walk 150 feet activity   Assist Walk 150 feet activity did not occur: Safety/medical concerns         Walk 10 feet on uneven surface  activity   Assist Walk 10 feet on uneven surfaces activity did not occur: Safety/medical concerns         Wheelchair     Assist Is the patient using a wheelchair?: Yes Type of Wheelchair: Manual    Wheelchair assist level: Dependent - Patient 0% Max wheelchair distance: >150 ft    Wheelchair 50 feet with 2 turns activity    Assist        Assist Level: Dependent - Patient 0%   Wheelchair 150 feet activity     Assist      Assist Level: Dependent - Patient 0%   Blood pressure 124/79, pulse 92, temperature 98.3 F (36.8 C), temperature source Oral, resp. rate 18, height 6' (1.829 m), weight 78.2 kg, SpO2 94 %.  Medical Problem List and Plan: 1. Functional deficits secondary to Left temporal contusion             -patient may  shower             -ELOS/Goals: 12-14d, minA/Sup   -Continue CIR therapies including PT, OT, and SLP  2.  Antithrombotics: -DVT/anticoagulation:  Mechanical: Sequential compression devices, below knee Bilateral lower extremities 11.23- Eliquis restarted after head CT- per NSU             -antiplatelet therapy: N/a 3. Pain Management: left shoulder pain seems better today. Still limited with ROM.   -  kpad  -scheduled tylenol 4.  Mood/Behavior/Sleep: LCSW to follow for evaluation and support.             --Continue melatonin with Trazodone prn for insomnia.             -antipsychotic agents:   Zyprexa.    11/27 sleeping better with increased zyprexa 5mg   5. Neuropsych/cognition: This patient is not quite capable of making decisions on his own behalf. 6. Skin/Wound Care: Routine pressure relief measures.  to  multiple wounds  -left forearm wound healing, no cellulitis there 7. Fluids/Electrolytes/Nutrition: Monitor I/O.   -encourage PO -labs reasonable this morning -protein supp added for low albumin  8. CAF: Monitor HR TID-- .              --CT with improvement    -Eliquis restarted per NSU  9. H/o GERD: Continue Pepcid--resume PPI. 10. HTN: Monitor BP TID--on nimotop thru 12/05. Continue Lopressor.   11/21 bp controlled  11/24-  continue lopressor 25mg  bid 11. Left moderate pleural effusion/Emphysema per X rays: Flutter valve added for pulmonary hygiene.   -encouraged IS 12. Oral secretions: Continue scopolamine patch.   13. Seizure Hx: Keppra resumed as he was on this previously 14. Hx AAA, control BP  15. Orthostatic hypotension  11/27 bp's better with reduced lopressor--obsv  LOS: 7 days A FACE TO FACE EVALUATION WAS PERFORMED  Meredith Staggers 04/23/2022, 10:25 AM

## 2022-04-24 DIAGNOSIS — I10 Essential (primary) hypertension: Secondary | ICD-10-CM | POA: Diagnosis not present

## 2022-04-24 DIAGNOSIS — S069X1S Unspecified intracranial injury with loss of consciousness of 30 minutes or less, sequela: Secondary | ICD-10-CM | POA: Diagnosis not present

## 2022-04-24 DIAGNOSIS — Z96612 Presence of left artificial shoulder joint: Secondary | ICD-10-CM | POA: Diagnosis not present

## 2022-04-24 DIAGNOSIS — I482 Chronic atrial fibrillation, unspecified: Secondary | ICD-10-CM | POA: Diagnosis not present

## 2022-04-24 LAB — GLUCOSE, CAPILLARY: Glucose-Capillary: 100 mg/dL — ABNORMAL HIGH (ref 70–99)

## 2022-04-24 NOTE — Progress Notes (Signed)
PROGRESS NOTE   Subjective/Complaints:  Seems to be sleeping better. Says he woke up at 0400. Looks fresh and alert. Feels pretty well  ROS: Patient denies fever, rash, sore throat, blurred vision, dizziness, nausea, vomiting, diarrhea, cough, shortness of breath or chest pain, joint or back/neck pain, headache, or mood change.   Objective:   No results found. Recent Labs    04/23/22 0547  WBC 5.1  HGB 12.4*  HCT 36.2*  PLT 315    Recent Labs    04/23/22 0547  NA 139  K 3.4*  CL 110  CO2 18*  GLUCOSE 95  BUN 27*  CREATININE 1.24  CALCIUM 9.2     Intake/Output Summary (Last 24 hours) at 04/24/2022 [id number] Last data filed at 04/24/2022 0850 Gross per 24 hour  Intake 640 ml  Output 400 ml  Net 240 ml     Pressure Injury 04/16/22 Buttocks Right Stage 2 -  Partial thickness loss of dermis presenting as a shallow open injury with a red, pink wound bed without slough. (Active)  04/16/22 1837  Location: Buttocks  Location Orientation: Right  Staging: Stage 2 -  Partial thickness loss of dermis presenting as a shallow open injury with a red, pink wound bed without slough.  Wound Description (Comments):   Present on Admission: Yes     Pressure Injury 04/16/22 Penis Anterior Stage 2 -  Partial thickness loss of dermis presenting as a shallow open injury with a red, pink wound bed without slough. Device related pressure injury (Active)  04/16/22 1840  Location: Penis  Location Orientation: Anterior  Staging: Stage 2 -  Partial thickness loss of dermis presenting as a shallow open injury with a red, pink wound bed without slough.  Wound Description (Comments): Device related pressure injury  Present on Admission: Yes    Physical Exam: Vital Signs Blood pressure 124/70, pulse 92, temperature 98.5 F (36.9 C), temperature source Oral, resp. rate 18, height 6' (1.829 m), weight 77.1 kg, SpO2 93  %.   Constitutional: No distress . Vital signs reviewed. HEENT: NCAT, EOMI, oral membranes moist Neck: supple Cardiovascular: RRR without murmur. No JVD    Respiratory/Chest: CTA Bilaterally without wheezes or rales. Normal effort    GI/Abdomen: BS +, non-tender, non-distended Ext: no clubbing, cyanosis, or edema Psych: pleasant and cooperative  Neuro: AAOx2; oriented to hospital, month/year. Follows commands.  MSK: Moving all 4 limbs antigravity but limited with ROM left shoulder Skin: a few scattered bruises and lacs, hematoma right forearm. Chronic venous changes bilateral LE's. Other wounds healing/resolving.     Assessment/Plan: 1. Functional deficits which require 3+ hours per day of interdisciplinary therapy in a comprehensive inpatient rehab setting. Physiatrist is providing close team supervision and 24 hour management of active medical problems listed below. Physiatrist and rehab team continue to assess barriers to discharge/monitor patient progress toward functional and medical goals  Care Tool:  Bathing    Body parts bathed by patient: Chest, Abdomen, Front perineal area, Right upper leg, Left upper leg, Face   Body parts bathed by helper: Right arm, Left arm, Buttocks, Right lower leg, Left lower leg     Bathing assist Assist Level:  Moderate Assistance - Patient 50 - 74%     Upper Body Dressing/Undressing Upper body dressing   What is the patient wearing?: Pull over shirt    Upper body assist Assist Level: Moderate Assistance - Patient 50 - 74%    Lower Body Dressing/Undressing Lower body dressing      What is the patient wearing?: Pants, Incontinence brief     Lower body assist Assist for lower body dressing: Total Assistance - Patient < 25%     Toileting Toileting    Toileting assist Assist for toileting: Maximal Assistance - Patient 25 - 49%     Transfers Chair/bed transfer  Transfers assist  Chair/bed transfer activity did not occur:  Safety/medical concerns  Chair/bed transfer assist level: Moderate Assistance - Patient 50 - 74%     Locomotion Ambulation   Ambulation assist      Assist level: Moderate Assistance - Patient 50 - 74% Assistive device: Walker-rolling Max distance: 3 ft   Walk 10 feet activity   Assist  Walk 10 feet activity did not occur: Safety/medical concerns (decreased activity tolerance)        Walk 50 feet activity   Assist Walk 50 feet with 2 turns activity did not occur: Safety/medical concerns         Walk 150 feet activity   Assist Walk 150 feet activity did not occur: Safety/medical concerns         Walk 10 feet on uneven surface  activity   Assist Walk 10 feet on uneven surfaces activity did not occur: Safety/medical concerns         Wheelchair     Assist Is the patient using a wheelchair?: Yes Type of Wheelchair: Manual    Wheelchair assist level: Dependent - Patient 0% Max wheelchair distance: >150 ft    Wheelchair 50 feet with 2 turns activity    Assist        Assist Level: Dependent - Patient 0%   Wheelchair 150 feet activity     Assist      Assist Level: Dependent - Patient 0%   Blood pressure 124/70, pulse 92, temperature 98.5 F (36.9 C), temperature source Oral, resp. rate 18, height 6' (1.829 m), weight 77.1 kg, SpO2 93 %.  Medical Problem List and Plan: 1. Functional deficits secondary to Left temporal contusion             -patient may  shower             -ELOS/Goals: 12-14d, minA/Sup   -Continue CIR therapies including PT, OT, and SLP. Interdisciplinary team conference today to discuss goals, barriers to discharge, and dc planning.   2.  Antithrombotics: -DVT/anticoagulation:  Mechanical: Sequential compression devices, below knee Bilateral lower extremities 11.23- Eliquis restarted after head CT- per NSU             -antiplatelet therapy: N/a 3. Pain Management: left shoulder pain seems better today. Still  limited with ROM.   -  kpad  -scheduled tylenol 4. Mood/Behavior/Sleep: LCSW to follow for evaluation and support.             --Continue melatonin with Trazodone prn for insomnia.             -antipsychotic agents:   Zyprexa.    11/28 sleeping better with increased zyprexa 5mg   5. Neuropsych/cognition: This patient is not quite capable of making decisions on his own behalf. 6. Skin/Wound Care: Routine pressure relief measures.  to multiple wounds  -left forearm  wound healing, no cellulitis there 7. Fluids/Electrolytes/Nutrition: Monitor I/O.   -encourage PO -labs reasonable this morning -protein supp added for low albumin  8. CAF: Monitor HR TID-- .              --CT with improvement    -Eliquis restarted per NSU   -no bleeding sequelae 9. H/o GERD: Continue Pepcid--resume PPI. 10. HTN: Monitor BP TID--on nimotop thru 12/05. Continue Lopressor.      11/28 bp controlled with lopressor 25mg  bid 11. Left moderate pleural effusion/Emphysema per X rays: Flutter valve added for pulmonary hygiene.   -encouraged IS 12. Oral secretions: Continue scopolamine patch.   13. Seizure Hx: Keppra resumed as he was on this previously 14. Hx AAA, control BP  15. Orthostatic hypotension  11/27 bp's better with reduced lopressor--obsv  LOS: 8 days A FACE TO [place] 04/24/2022, 9:09 AM

## 2022-04-24 NOTE — Patient Care Conference (Signed)
Inpatient RehabilitationTeam Conference and Plan of Care Update Date: 04/24/2022   Time:09:59 AM    Patient Name: Eugene [name]      Medical Record Number: [id number]  Date of Birth: [birth year] Sex: Male         Room/Bed: 4M03C/4M03C-01 Payor Info: Payor: MEDICARE / Plan: MEDICARE PART A AND B / Product Type: *No Product type* /    Admit Date/Time:  04/16/2022  4:41 PM  Primary Diagnosis:  TBI (traumatic brain injury) [facility])  Hospital Problems: Principal Problem:   TBI (traumatic brain injury) [facility])    Expected Discharge Date: Expected Discharge Date: 05/01/22  Team Members Present: Physician leading conference: Dr. Faith Rogue Social Worker Present: Cecile Sheerer, LCSWA Nurse Present: Chana Bode, RN PT Present: Serina Cowper, PT OT Present: Blanch Media, OT SLP Present: Feliberto Gottron, SLP PPS Coordinator present : Fae Pippin, SLP     Current Status/Progress Goal Weekly Team Focus  Bowel/Bladder   Pt is continent B/B with some episode of bladder incontinence. LBM 04/23/22  Pt will regain continenece   Toilet qshift and prn    Swallow/Nutrition/ Hydration   Regular textures with thin liquids, intermittent supervision   Supervision  tolerance of current diet, use of compensatory strategies    ADL's   min A mobilty, MOD A ADLs, intermittently participating in tx, communication has improved, however poor frustration tolerance-self limiting, 15/7 schedule dt refusals   Supervision-MIN A   standing balnace, endurance, cognition--orientation, AE/ADL training    Mobility               Communication   Mod A    Min A   word-finding, self-monitoring and correcting errors    Safety/Cognition/ Behavioral Observations  Mod A    Min A   orientation, intellectual waareness, attention    Pain   Denies pain on this shift    patient  pain <2   Assess for pain qshift/prn    Skin   Impaired skin intergreity to sacrum    Skin will be  free from infection with no further brekdown  Assess skin qshift/prn      Discharge Planning:  Pt to d/c to home with 24/7 hired caregivers and PRN support from pt children. Has [facility] for [facility]. SW will confirm no barriers to discharge.   Team Discussion: Patient remains incontinent of bladder, esp at HS. Sleeping better with Zyprexa and medication for insomnia however continues to have sundowning/confusion late evenings.   Patient on target to meet rehab goals: Currently when not totally alert, able to cooperate and does participate more with ambulating and self care. When more alert; exhibits self limiting behaviors, refuses therapy or reports fear of falling, fatigue, left shoulder and hip pain, etc. Overall, able to complete sit - stand with min assist, needs mod assist for ADLs. Tolerating regular diet; thin liquids with occasional coughing. Continue to note kinemic paraphasias however word finding has improved.  *See Care Plan and progress notes for long and short-term goals.   Revisions to Treatment Plan:  Behavior Plan updated   Teaching Needs: Safety, toileting, transfers, medications, dietary modifications, e   Current Barriers to Discharge: Decreased caregiver support and Behavior  Possible Resolutions to Barriers: Family education Recommend 24/7 care     Medical Summary Current Status: sleeping better, still sundowns. left shoulder still limiting d/t pain.  Barriers to Discharge: Medical stability;Behavior/Mood   Possible Resolutions to [employer]: daily assessment of labs and pt data, maximize sleep/cognition  Continued Need for Acute Rehabilitation Level of Care: The patient requires daily medical management by a physician with specialized training in physical medicine and rehabilitation for the following reasons: Direction of a multidisciplinary physical rehabilitation program to maximize functional independence : Yes Medical  management of patient stability for increased activity during participation in an intensive rehabilitation regime.: Yes Analysis of laboratory values and/or radiology reports with any subsequent need for medication adjustment and/or medical intervention. : Yes   I attest that I was present, lead the team conference, and concur with the assessment and plan of the team.   Chana Bode B 04/24/2022, 10:31 AM

## 2022-04-24 NOTE — Progress Notes (Signed)
Speech Language Pathology Weekly Progress and Session Note  Patient Details  Name: Eugene [name] MRN: [id number] Date of Birth: 1935-12-31  Beginning of progress report period: April 16, 2022 End of progress report period: April 24, 2022  Today's Date: 04/24/2022 SLP Individual Time: 5830-9407 SLP Individual Time Calculation (min): 45 min  Short Term Goals: Week 1: SLP Short Term Goal 1 (Week 1): Pt will tolerate regular diet with thin liquids with minimal s/sx conccerning for airway invasion and stable medical status at the Sup A level. SLP Short Term Goal 1 - Progress (Week 1): Met SLP Short Term Goal 2 (Week 1): Pt will participate in various naming tasks with 90% accuracy at the Mod I level. SLP Short Term Goal 2 - Progress (Week 1): Met SLP Short Term Goal 3 (Week 1): Pt will demonstrate orientation x 4 with Sup A. SLP Short Term Goal 3 - Progress (Week 1): Not met SLP Short Term Goal 4 (Week 1): Pt will communicate wants, needs, and ideas ~80% of the time given Min A. SLP Short Term Goal 4 - Progress (Week 1): Met SLP Short Term Goal 5 (Week 1): Pt will demonstrate increased intellectual and emergent awareness by naming 2 deficits s/p TBI with Mod A. SLP Short Term Goal 5 - Progress (Week 1): Not met SLP Short Term Goal 6 (Week 1): Pt will increase speech intelligibility to be 90% at the sentence level given Min A. SLP Short Term Goal 6 - Progress (Week 1): Met    New Short Term Goals: Week 2: SLP Short Term Goal 1 (Week 2): Pt will tolerate regular diet with thin liquids with minimal s/sx conccerning for airway invasion and stable medical status with Mod I. SLP Short Term Goal 2 (Week 2): Pt will demonstrate orientation x 4 with Sup A. SLP Short Term Goal 3 (Week 2): Pt will demonstrate increased intellectual and emergent awareness by naming 2 deficits s/p TBI with Mod A. SLP Short Term Goal 4 (Week 2): Patient will demonstrate sustained attention to functional tasks  for 30 minutes with Min verbal cues for redirection. SLP Short Term Goal 5 (Week 2): Pt will communicate wants, needs, and ideas ~90% of the time given Min A verbal cues for word-finding. SLP Short Term Goal 6 (Week 2): Patient will self-monitor and correct verbal errors with Mod multimodal cues.  Weekly Progress Updates: Patient has made functional gains and has met 4 of 6 STGs this reporting period. Currently, patient is consuming regular textures with thin liquids with minimal overt s/s of aspiration and intermittent supervision level verbal cues for use of swallowing compensatory strategies. Patient's overall cognitive functioning is improving but patient's participation can be limited at times due to pain, fatigue, and poor frustration tolerance. Patient also with intermittent confusion that tends to be more prominent in the afternoon/evening. Overall, patient requires Mod-Max A multimodal cues for orientation, recall of functional information, and intellectual awareness of deficits but improved sustained attention to functional tasks. Patient is 100% intelligible at the phrase level but demonstrates moderate word-finding deficits during informal conversation with overall Max A multimodal cues and extra time needed to self-monitor and correct errors. Word-finding has improved to 90-100% with supervision level verbal cues during structured language tasks. Patient and family education ongoing. Patient would benefit from continued skilled SLP intervention to maximize his cognitive-linguistic and swallowing functioning and overall functional independence prior to discharge.      Intensity: Minumum of 1-2 x/day, 30 to 90 minutes Frequency: 3 to  5 out of 7 days Duration/Length of Stay: 12-16 days Treatment/Interventions: Cognitive remediation/compensation;Functional tasks;Dysphagia/aspiration precaution training;Internal/external aids;Patient/family education;Speech/Language facilitation;Therapeutic  Activities;Environmental controls;Cueing hierarchy   Daily Session  Skilled Therapeutic Interventions:  Skilled treatment session focused on cognitive-linguistic and dysphagia goals. Upon arrival, patient was awake while upright in wheelchair and reported he had been up "since 4am." Patient with language of confusion and disorientation but able to utilize external aids with Mod verbal and visual cues. Phonemic paraphasias noted when reading aloud. Patient had been incontinent with minimal awareness but was able to stand X 2 with RW for peri care and clothing management. Patient also attempted to remove his shirt independently but was limited by pain.  SLP performed tray set-up and patient consumed breakfast meal of regular textures with thin liquids without overt s/s of aspiration noted. Recommend patient continue current diet. Patient left upright in wheelchair with alarm on and all needs within reach. Continue with current plan of care.    Pain Pain in shoulder with intermittent movement, SLP provided encouragement and distraction   Therapy/Group: Individual Therapy  ,  04/24/2022, 6:53 AM

## 2022-04-24 NOTE — Progress Notes (Addendum)
Patient ID: Eugene [name], male   DOB: 03/14/1936, 86 y.o.   MRN: [id number]  [id number] called pt dtr [name] [phone]) to provide updates from team conference, d/c date 12/5, discuss family edu, and stressing someone likely being in the same room as pt to ensure no future falls in the evening. SW waiting on follow-up from pt dtr.   *SW received return phone call from pt dtr [name], and SW discussed above. Family edu scheduled for Monday (12/4) 9am-12pm. She also reports she will be purchasing an alarm pad to use while pt is in the bed or recliner so the aid staff are aware when he gets up--and accommodations can be made for someone to be in his room. She reports she is still trying to get hospital bed through PCP.   Cecile Sheerer, MSW, LCSWA Office: [phone] Cell: [phone] Fax: [phone]

## 2022-04-24 NOTE — Progress Notes (Signed)
Occupational Therapy TBI Note  Patient Details  Name: Eugene [name] MRN: [id number] Date of Birth: Oct 10, 1935  Today's Date: 04/24/2022 OT Individual Time: 1120-1200 OT Individual Time Calculation (min): 40 min    Short Term Goals: Week 1:  OT Short Term Goal 1 (Week 1): Pt will complete 2/3 steps of donning pants OT Short Term Goal 2 (Week 1): Pt will complete toilet transfer with LRAD at CGA level OT Short Term Goal 3 (Week 1): Pt will don shirt EOB with no A for sitting balance OT Short Term Goal 4 (Week 1): Pt will bathe with MIN A at sit to stand level  Skilled Therapeutic Interventions/Progress Updates:     Pt received in TIS with unrated shoulder pain.  Rest and repositioning provided for pain relief  Therapeutic exercise Pt initially declining tx refusing to get up out of TIS for standing activities. Pivoted to [employer] training for seated tx participation to improve BLE strength and endurance on 60 cm/sec velocity. Pt then confused stating, "I need to get back to my room for my appointment. Pt with difficulty using BLE ot propel w/c therefore more agreeable to walk back to room with MIN A to power up and CGA for ~250 feet mobility back to room.   Pt left at end of session in TIS with exit alarm on, call light in reach and all needs met   Therapy Documentation Precautions:  Precautions Precautions: Fall Restrictions Weight Bearing Restrictions: No General:      Agitated Behavior Scale: TBI ABS discontinued d/t ABS score less than 20 for the last three days or no behaviors present  Therapy/Group: Individual Therapy  [name] 04/24/2022, 6:49 AM

## 2022-04-24 NOTE — Progress Notes (Addendum)
Physical Therapy Weekly Progress Note  Patient Details  Name: Eugene [name] MRN: [id number] Date of Birth: 1935-05-29  Beginning of progress report period: April 17, 2022 End of progress report period: April 24, 2022  Today's Date: 04/24/2022 PT Individual Time: 1779-3903 and 1330-1420  PT Individual Time Calculation (min): 55 min and 50 min  Patient has met 3 of 3 short term goals.  Patient with slow progress this week, limited by fatigue, L hip and shoulder pain, and self limiting behaviors. Patient with limited internal motivation to participate in therapies, however, very restless and mobile when confused and with reduced orientation. Able to perform bed mobility with supervision, transfers with min A, and gait >250 ft with CGA-min A using RW. Will continue to work on strategies for improved participation and incorporate family in hands on training throughout patient's stay.   Patient continues to demonstrate the following deficits muscle weakness and muscle joint tightness, decreased cardiorespiratoy endurance, decreased initiation, decreased attention, decreased awareness, decreased safety awareness, decreased memory, delayed processing, and demonstrates behaviors consistent with Rancho Level VI, and decreased sitting balance, decreased standing balance, decreased postural control, decreased balance strategies, and difficulty maintaining precautions and therefore will continue to benefit from skilled PT intervention to increase functional independence with mobility.  Patient progressing toward long term goals.  Continue plan of care.  PT Short Term Goals Week 1:  PT Short Term Goal 1 (Week 1): Patient will perform bed mobility with supervision consistently. PT Short Term Goal 1 - Progress (Week 1): Met PT Short Term Goal 2 (Week 1): Patient will perform basic transfers with min A using LRAD. PT Short Term Goal 2 - Progress (Week 1): Met PT Short Term Goal 3 (Week 1): Patient  will ambulate >50 ft using LRAD with min A. PT Short Term Goal 3 - Progress (Week 1): Met Week 2:  PT Short Term Goal 1 (Week 2): STG=LTG due to ELOS.  Skilled Therapeutic Interventions/Progress Updates:     Session 1: Patient in TIS w/c in the room upon PT arrival. Patient alert and agreeable to PT session. Patient reported un-rated L shoulder pain during session, RN made aware. PT provided repositioning, rest breaks, and distraction as pain interventions throughout session.   Patient oriented to self, location, situation (with min cues), month, and year. Used Calendar to orient to day of the month and day of the week. Patient continues with language of confusion and difficulties with expressive language. Patient reports increased fatigue, states, "I have been up since 4am!" Per chart patient with poor sleep and restless throughout the night.   Discussed patient's R shoulder pain, limited to 75-90 deg shoulder flexion. Performed shoulder flexion in pain free AAROM 2x10.  Therapeutic Activity: Transfers: Patient performed stand pivot TIS w/c<>BSC over toilet using grab bar with min A. Provided min cues for forward weigh shift. Patient was unable to void or have a BM during toileting. Performed lower body clothing management with max-total A. He performed sit to/from stand x1 with min A using RW. Provided verbal cues for initiation and hand placement for safety.  Gait Training:  Patient ambulated 180 feet using RW with CGA.  Ambulated with decreased gait speed, decreased step length and height, mild L antalgic gait, forward trunk lean, and downward head gaze. Provided verbal cues for looking ahead and visual scanning.   Patient required increased time for initiation, cuing, rest breaks, and for completion of tasks throughout session. Utilized therapeutic use of self throughout to promote efficiency.  Patient in [place] w/c per patient request at end of session with breaks locked, seat belt alarm  set, and all needs within reach.   Session 2: Patient in TIS w/c with a caregiver in the room upon PT arrival. Patient alert and agreeable to PT session. Patient denied pain during session.  Patient declined standing or ambulatory activities and declined toileting or seated exercise. PT brought in a deck of cards and patient agreeable to play 21. Patient able to explain how to deal the game, rules to play, and only required 2 cues to correct addition (1 point off each time). Patient utilized both hands and table far enough away to require patient to maintain unsupported sitting with good trunk control throughout 15 rounds of 21 with patient winning most rounds.   Discussed activities patient engages in at home. Patient reports he use to play cards, listen to his 1950;s [employer], and play piano and clarinet. States that now he only watches [employer] during the day. Patient denied interest in participating in these activities at home. Will continue to incorporate recreational activities into therapy sessions to promote carry-over at home for improved emotional state and participation in his daily routine and social activities.    Patient in [place] w/c with his caregiver in the room, patient declined going to bed at this time despite overt signs of fatigue at end of session with breaks locked, seat belt alarm set, and all needs within reach.   Therapy Documentation Precautions:  Precautions Precautions: Fall Restrictions Weight Bearing Restrictions: No   Therapy/Group: Individual Therapy  Moxie Kalil L Elsie Sakuma PT, DPT, NCS, CBIS  04/24/2022, 12:13 PM

## 2022-04-24 NOTE — Progress Notes (Signed)
Pt was up most of the night with confusion trying to get out of bed multiple times. Redirected pt and monitored frequently for safety.PRN Trazodone administered but less effective.

## 2022-04-25 ENCOUNTER — Inpatient Hospital Stay (HOSPITAL_COMMUNITY): Payer: Medicare Other

## 2022-04-25 NOTE — Plan of Care (Signed)
  Problem: RH Dressing Goal: LTG Patient will perform lower body dressing w/assist (OT) Description: LTG: Patient will perform lower body dressing with assist, with/without cues in positioning using equipment (OT) 04/25/2022 0658 by Shon Hale, OT Flowsheets (Taken 04/25/2022 [phone]) LTG: Pt will perform lower body dressing with assistance level of: Moderate Assistance - Patient 50 - 74% Note: Goal downgraded d/t progress 04/25/2022 0656 by Shon Hale, OT Flowsheets (Taken 04/25/2022 [phone]) LTG: Pt will perform lower body dressing with assistance level of: Contact Guard/Touching assist Note: Goal downgraded d/t progress

## 2022-04-25 NOTE — Progress Notes (Signed)
Occupational Therapy Weekly Progress Note  Patient Details  Name: Eugene [name] MRN: [id number] Date of Birth: [birth year]  Beginning of progress report period: April 17, 2022 End of progress report period: April 25, 2022  Today's Date: 04/25/2022 OT Individual Time: 1022-1101 OT Individual Time Calculation (min): 39 min    Patient has met 2 of 4 short term goals.  Pt has made slow progress this reporting period mostly d/t pt refusal to participate in tx and any suggested activity. Per family pt stubborn at baseline and first reaction to anything is "no" therefore the TBI has only excerbated this behavior. The pt can mobilize with CGA A up to 250' with a RW but is limited by back and L shoulder pain. Education needed with caregivers as pt with increased restlessness and confusion at night time to discuss behavior management when pt Dcs home. Pt hashad limited participation on BADLs.  Patient continues to demonstrate the following deficits: muscle weakness, decreased cardiorespiratoy endurance, impaired timing and sequencing, decreased coordination, and decreased motor planning, decreased visual perceptual skills, decreased motor planning, decreased initiation, decreased attention, decreased awareness, decreased problem solving, decreased safety awareness, decreased memory, delayed processing, and demonstrates behaviors consistent with Rancho Level 6, and decreased sitting balance, decreased standing balance, decreased postural control, and decreased balance strategies and therefore will continue to benefit from skilled OT intervention to enhance overall performance with BADL and Reduce care partner burden.  Patient .  Plan of care revisions: LB dressing-MOD A.  not progressing toward long term goals.  See goal revision. May downgrade mobility to CGA OT Short Term Goals  Week 1:  OT Short Term Goal 1 (Week 1): Pt will complete 2/3 steps of donning pants OT Short Term Goal 1 - Progress  (Week 1): Progressing toward goal OT Short Term Goal 2 (Week 1): Pt will complete toilet transfer with LRAD at CGA level OT Short Term Goal 2 - Progress (Week 1): Met OT Short Term Goal 3 (Week 1): Pt will don shirt EOB with no A for sitting balance OT Short Term Goal 3 - Progress (Week 1): Met OT Short Term Goal 4 (Week 1): Pt will bathe with MIN A at sit to stand level OT Short Term Goal 4 - Progress (Week 1): Progressing toward goal Week 2:  OT Short Term Goal 1 (Week 2): STG=LTG d/t ELOS Skilled Therapeutic Interventions/Progress Updates:    Pt received in bed asleep with intermittent pain in RUE limiting reaching providing rest breaks as needed ADL: Pt completes ADL at overall MOD A at EOB Level. Skilled interventions include:use of dirty brief as extrinsic motivation for mobility, A for trunk elevation OOB, supervision for sitting balance at EOB, S to doff and don shirt, A to fully thread BLE into pants (pt should trial reacher for improved independence), and MOD A for standing balance at EOB to advance pants pasthips. CGA to pivot tow/c with RW and set up for grooming at sink.   Pt left at end of session in bed with exit alarm on, call light in reach and all needs met   Therapy Documentation Precautions:  Precautions Precautions: Fall Restrictions Weight Bearing Restrictions: No General:   Therapy/Group: Individual Therapy  [name] 04/25/2022, 6:51 AM

## 2022-04-25 NOTE — Plan of Care (Deleted)
Downgraded d/t difficulty with brace SMS  Addeddum--entered in error

## 2022-04-25 NOTE — Plan of Care (Signed)
Wound Plan  Wounds present: See EPIC for pics Pressure Injury 01/11/22 Coccyx Medial Deep Tissue Pressure Injury  Pressure Injury 04/16/22 Buttocks Right Stage 2  Pressure Injury 04/16/22 Penis Anterior Stage 2  Cellulitis left elbow  Braden Score: 12 Sensory: 2 Moisture: 3 Activity: 2 Mobility: 2 Nutrition: 2 Friction: 1  Interventions:  Toileting protocol; keep groin/penis skin clean/dry Foam to sacrum/buttocks. Change TIW and prn Pressure relief cushion for wheelchair Roho cushion and TIS wheelchair Regular diet; eating < 50% Added magic cups and nutritional supplement Doxy on board for cellulitis Prosource BID Vit C, Vit B1 and Zinc daily  Contributors: Luevenia Maxin, LPN Blanch Media, OT [place], PT Chana Bode, RN   11/29/ 23 charting of wounds has been completed in flowsheets patient does have some bruising to Bilateral Buttocks but the bruising is blanchable all wounds removed from the chart as healed. Wound plan discontinued

## 2022-04-25 NOTE — Progress Notes (Signed)
Physical Therapy Session Note  Patient Details  Name: Eugene [name] MRN: [id number] Date of Birth: May 03, 1936  Today's Date: 04/25/2022 PT Individual Time: 1300-1350 PT Individual Time Calculation (min): 50 min   Short Term Goals: Week 2:  PT Short Term Goal 1 (Week 2): STG=LTG due to ELOS.  Skilled Therapeutic Interventions/Progress Updates:     Patient in TIS w/c with caregiver in the room upon PT arrival. Patient alert and agreeable to PT session. Patient reported 9/10 L hip pain with weight bearing and 10/10 L shoulder pain with shoulder flexion during session, RN made aware. PT provided repositioning, rest breaks, and distraction as pain interventions throughout session. Patient and the caregiver endorse that this pain presentation is different from his baseline PTA. Caregiver reports patient landed on his L side and asking about new injury to his hip and shoulder. PA made aware.   Therapeutic Activity: Transfers: Patient performed sit to/from stand x1 with CGA using RW. Provided verbal cues for initiation and hand placement on RW.  Gait Training:  Patient ambulated 168 feet using RW with CGA, limited distance due to intolerance of L hip pain with ambulation. Ambulated with decreased gait speed, decreased step length and height, antalgic gait on L, forward trunk lean, and downward head gaze. Provided verbal cues for erect posture, visual scanning, and step-to gait pattern leading with L to off-weight left hip with increased hip pain during ambulation.  Patient required increased time for initiation, cuing, rest breaks, and for completion of tasks throughout session. Utilized therapeutic use of self throughout to promote efficiency.   Patient in TIS w/c with caregiver in the room, patient declined laying down despite clear signs of fatigue, at end of session with breaks locked, seat belt alarm set, and all needs within reach.   Therapy Documentation Precautions:   Precautions Precautions: Fall Restrictions Weight Bearing Restrictions: No    Therapy/Group: Individual Therapy  Leanora Murin L Tona Qualley PT, DPT, NCS, CBIS  04/25/2022, 3:45 PM

## 2022-04-25 NOTE — Progress Notes (Signed)
Speech Language Pathology TBI Note  Patient Details  Name: Eugene [name] MRN: [id number] Date of Birth: 1936-04-27  Today's Date: 04/25/2022 SLP Individual Time: 6213-0865 SLP Individual Time Calculation (min): 50 min  Short Term Goals: Week 2: SLP Short Term Goal 1 (Week 2): Pt will tolerate regular diet with thin liquids with minimal s/sx conccerning for airway invasion and stable medical status with Mod I. SLP Short Term Goal 2 (Week 2): Pt will demonstrate orientation x 4 with Sup A. SLP Short Term Goal 3 (Week 2): Pt will demonstrate increased intellectual and emergent awareness by naming 2 deficits s/p TBI with Mod A. SLP Short Term Goal 4 (Week 2): Patient will demonstrate sustained attention to functional tasks for 30 minutes with Min verbal cues for redirection. SLP Short Term Goal 5 (Week 2): Pt will communicate wants, needs, and ideas ~90% of the time given Min A verbal cues for word-finding. SLP Short Term Goal 6 (Week 2): Patient will self-monitor and correct verbal errors with Mod multimodal cues.  Skilled Therapeutic Interventions: Skilled treatment session focused on cognitive-linguistic goals. Upon arrival, patient appeared flat and mildly disengaged. SLP facilitated session by providing Mod A verbal and visual cues for use of external aids for orientation to place, time, and situation. Patient initially with increased word-finding deficits during informal conversation with intermittent phonemic paraphasias. Patient with appropriate awareness but would then stop mid sentence instead of attempting to self-correct errors. Patient eventually agreeable to a structured language task that he performed with overall Min verbal cues for word-finding. Patient appeared brighter by end of session. Patient left upright in bed with alarm on and all needs within reach. Continue with current plan of care.      Pain Intermittent pain when moving   Agitated Behavior  Scale: TBI Observation Details Observation Environment: CIR Start of observation period - Date: 04/25/22 Start of observation period - Time: 0840 End of observation period - Date: 04/25/22 End of observation period - Time: 0930 Agitated Behavior Scale (DO NOT LEAVE BLANKS) Short attention span, easy distractibility, inability to concentrate: Present to a slight degree Impulsive, impatient, low tolerance for pain or frustration: Present to a slight degree Uncooperative, resistant to care, demanding: Absent Violent and/or threatening violence toward people or property: Absent Explosive and/or unpredictable anger: Absent Rocking, rubbing, moaning, or other self-stimulating behavior: Absent Pulling at tubes, restraints, etc.: Absent Wandering from treatment areas: Absent Restlessness, pacing, excessive movement: Absent Repetitive behaviors, motor, and/or verbal: Absent Rapid, loud, or excessive talking: Absent Sudden changes of mood: Absent Easily initiated or excessive crying and/or laughter: Absent Self-abusiveness, physical and/or verbal: Absent Agitated behavior scale total score: 16  Therapy/Group: Individual Therapy  Maykel Reitter 04/25/2022, 3:28 PM

## 2022-04-25 NOTE — Progress Notes (Signed)
Inpatient Rehabilitation Care Coordinator Assessment and Plan Patient Details  Name: Eugene [name] MRN: [id number] Date of Birth: [birth year]  Today's Date: 04/25/2022  Hospital Problems: Principal Problem:   TBI (traumatic brain injury) [facility])  Past Medical History:  Past Medical History:  Diagnosis Date   AAA (abdominal aortic aneurysm) (HCC) 06/29/2007   stent graft   Aortic stenosis 09/05/2017   Mild AS/AR by echo 01/2017.   GERD (gastroesophageal reflux disease)    Hemochromatosis    Hyperlipidemia    Hypertension    Irritable bowel syndrome    Permanent atrial fibrillation (HCC)    Seizure (HCC)    Stroke (HCC)    Transient Ischemic Attack   Past Surgical History:  Past Surgical History:  Procedure Laterality Date   ABDOMINAL AORTIC ANEURYSM REPAIR     ENDOVASCULAR STENT INSERTION  07/18/2007   saccular infrarenal aortic aneurysm   EYE SURGERY Bilateral    Cataract     IR FLUORO GUIDE CV LINE LEFT  12/08/2018   IR FLUORO GUIDE CV LINE RIGHT  02/13/2017   IR FLUORO GUIDE CV LINE RIGHT  02/25/2017   IR US GUIDE VASC ACCESS RIGHT  02/13/2017   PICC LINE PLACE PERIPHERAL ([facility] HX)  Oct 21, 2014   Social History:  reports that he quit smoking about 22 years ago. His smoking use included cigarettes. He has a 40.00 pack-year smoking history. He has never used smokeless tobacco. He reports current alcohol use of about 1.0 - 2.0 standard drink of alcohol per week. He reports that he does not use drugs.  Family / Support Systems Marital Status: Widow/Widower Patient Roles: Parent Spouse/Significant Other: Widowed Children: Two adult children Other Supports: caregivers Anticipated Caregiver: Caregivers and PRN support from children Ability/Limitations of Caregiver: None reported Caregiver Availability: 24/7 Family Dynamics: Pt lives at home with caregivers  Social History Preferred language: English Religion: [employer] Cultural Background: Pt worked in Airline pilot for 25  yrs Education: high school grad Primary school teacher - How often do you need to have someone help you when you read instructions, pamphlets, or other written material from your doctor or pharmacy?: Never Writes: Yes Employment Status: Retired Marine scientist Issues: Denies Guardian/Conservator: N/A   Abuse/Neglect Abuse/Neglect Assessment Can Be Completed: Unable to assess, patient is non-responsive or altered mental status  Patient response to: Social Isolation - How often do you feel lonely or isolated from those around you?: Patient unable to respond  Emotional Status Pt's affect, behavior and adjustment status: Pt in good spirits at time of visit. He is confused with some of the questions asked, and struggled with answering them correctly. Recent Psychosocial Issues: Denies Psychiatric History: Denies Substance Abuse History: Denies  Patient / Family Perceptions, Expectations & Goals Pt/Family understanding of illness & functional limitations: Pt family has a general understanding of pt care needs Premorbid pt/family roles/activities: Independent Anticipated changes in roles/activities/participation: Assistance with ADLs/IADLs Pt/family expectations/goals: Pt goal " i don't know."  [employer]: None Premorbid Home Care/DME Agencies: None Transportation available at discharge: TBD Is the patient able to respond to transportation needs?: Yes In the past 12 months, has lack of transportation kept you from medical appointments or from getting medications?: No In the past 12 months, has lack of transportation kept you from meetings, work, or from getting things needed for daily living?: No Resource referrals recommended: Neuropsychology  Discharge Planning Living Arrangements: Non-relatives/Friends Support Systems: Children, Home care staff Type of Residence: Private residence Insurance Resources: [employer] Financial Resources:  Social  Security Financial Screen Referred: No Living Expenses: Own Money Management: Family Does the patient have any problems obtaining your medications?: No Home Management: Aides help assist with homecare needs Patient/Family Preliminary Plans: no changes Care Coordinator Anticipated Follow Up Needs: HH/OP  Clinical Impression Unsure if pt answers are accurate due to confusion. 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   Gretchen Short 04/25/2022, 4:47 PM

## 2022-04-26 NOTE — Progress Notes (Signed)
Occupational Therapy TBI Note  Patient Details  Name: Eugene [name] MRN: [id number] Date of Birth: Oct 23, 1935  Today's Date: 04/26/2022 OT Individual Time: 1050-1200 OT Individual Time Calculation (min): 70 min    Short Term Goals: Week 1:  OT Short Term Goal 1 (Week 1): Pt will complete 2/3 steps of donning pants OT Short Term Goal 1 - Progress (Week 1): Progressing toward goal OT Short Term Goal 2 (Week 1): Pt will complete toilet transfer with LRAD at CGA level OT Short Term Goal 2 - Progress (Week 1): Met OT Short Term Goal 3 (Week 1): Pt will don shirt EOB with no A for sitting balance OT Short Term Goal 3 - Progress (Week 1): Met OT Short Term Goal 4 (Week 1): Pt will bathe with MIN A at sit to stand level OT Short Term Goal 4 - Progress (Week 1): Progressing toward goal  Skilled Therapeutic Interventions/Progress Updates:     Pt received in bed with unrated pain in shoulder reaching. Rest and repositiong  provided for pain relief  ADL: PT daughter present and OT discusses goals, expectations of caregivers, DME, shower set up and DC planning. Daughter to be present for family education on Monday but remainsed for whole session to see how pt moves as this clinician will not be present for Monday. Simulated shower set up to match as much as at home with grab bar, walker placement and shower seat with arm rests. Pt completes 2x with CGA. First time not following cues and attempting to bring RW into shower with him, second time with improved carry over and safety with negotating over ledge. Prolonged seated rest breaks provided.  Provided the following written instrucitons for family education to provide to caregivers for continuity: [name]'s Discharge instructions: OT General mobility- they should always use their RW (rolling walker) The walker should be kept within reach so they can pull it close to get up and keep with them to back up to any surface they want to sit on. When  getting up, they should push up from the surface they are getting up from and reach back when sitting to a new surface, no plopping.  You are their "shadow." Especially in the beginning. You, as the helper should have a hand on Eugene [name] anytime he is getting up mobilizing. You should be either beside or behind them so if they lose their balance you can assist by helping correct at the hips. This is likely closer than you are used to being- be in their personal space. Use a gait belt if that makes you feel more comfortable Eugene [name] is prone to losing his balance while turning to sit in the chair- remind him to take small pivots and keep the walker with him Eugene [name] also has a tendency to lean backwards- he needs to "keep his weight in his toes" you can see his toes lift up when he is leaning. Keeping the walker slightly forward helps encourage his weight forward If you are attempting to get up/transfer and it is not going well, reset. Have him sit back down. Make sure he is close to the edge of the seat, feet are underneath them at hips distance, and they are leaning forward to stand up. Bathing- they should sit to bathe on a shower chair, especially for washing legs/feet. Sitting will save energy and increase safety. Consider drying off the floor before getting out of the shower using the grab bars and having the walker ready for him to  step into Dressing- all should be done from a SEATED level, especially to put underwear and pants over feet. He will need help to do the pants/underwear/footwear as he has pain reaching forward Toileting- the RW can be walked right over the toilet for standing urination if applicable. If seated toileting is more appropriate, have them walk up to the toilet and keep walker with them as they turn to sit to toilet or BSC. Before they stand to pull up pants past hips they should pull pants/underwear up past their knees to decrease the need to bend forward to the floor. Sometimes this makes  people dizzy. Have Eugene [name] bring his hands to the walker prior to trying to pull his pants up to keep his weight forward. If he doesn't do this he often loses his balance backwards if incontinence/bathroom accidents are an issue attempt to toilet every 2-3 hours to improve success with toileting and decrease accidents.  Energy conservation principles- Prioritize what needs to be done and what can be moved to another day Plan out their days, weeks, months to spread out taxing (physical or cognitively tiring) activities to not put too much at one time Pace activities- rest before feeling tired and have designated places to rest if they feel tired and need to take a brake Position for success: sit when able to conserve 25% more energy than standing  Pain- try ice, heat, and muscle rubs for pain. Do not put Ice or heat immediately and directly on top of muscle rub as this could cause skin irritation Therapeutic activity Standing balance in regular and staggered stance for reaching for clothes pins and putting them up on a clothing line with increased posterior lean with LLE forward but overall remains CGA. 1 seated rest break required.  Pt left at end of session in bed with exit alarm on, call light in reach and all needs met   Therapy Documentation Precautions:  Precautions Precautions: Fall Restrictions Weight Bearing Restrictions: No General:    Agitated Behavior Scale: TBI ABS discontinued d/t ABS score less than 20 for the last three days or no behaviors present       Therapy/Group: Individual Therapy  [name] 04/26/2022, 6:53 AM

## 2022-04-26 NOTE — Progress Notes (Signed)
  Patient resting and awake in bed when assigned NT entered the room during her rounding.He was noted to be incontinent of urine but he insisted he was okay.and needed to be left alone, He became agitated and frustrated but was. encouraged and coached to allow staff to assist hm.with incontinent care.Writer noted that from earlier encounter with the patient, he was cooperative. He bow appeared somewhat forgetful and disoriented and could not recall what was going on or what we where doing.This patient was reoriented to place,time and purpose of what had occurred. Patient was resting and more cooperative,Tele monitoring continue, side rails,call bell with in reach and floor mat in place, no acute distress noted, Patient also refuse his schedule medication .Sleep chart in place

## 2022-04-26 NOTE — Plan of Care (Signed)
Goals downgraded d/t progress and Discussion with family/caregivers about assist levels can be provided. SMS  Problem: RH Balance Goal: LTG Patient will maintain dynamic standing with ADLs (OT) Description: LTG:  Patient will maintain dynamic standing balance with assist during activities of daily living (OT)  Flowsheets (Taken 04/26/2022 1932) LTG: Pt will maintain dynamic standing balance during ADLs with: Contact Guard/Touching assist   Problem: RH Toileting Goal: LTG Patient will perform toileting task (3/3 steps) with assistance level (OT) Description: LTG: Patient will perform toileting task (3/3 steps) with assistance level (OT)  Flowsheets (Taken 04/26/2022 1932) LTG: Pt will perform toileting task (3/3 steps) with assistance level: Contact Guard/Touching assist   Problem: RH Toilet Transfers Goal: LTG Patient will perform toilet transfers w/assist (OT) Description: LTG: Patient will perform toilet transfers with assist, with/without cues using equipment (OT) Flowsheets (Taken 04/26/2022 1932) LTG: Pt will perform toilet transfers with assistance level of: Contact Guard/Touching assist   Problem: RH Tub/Shower Transfers Goal: LTG Patient will perform tub/shower transfers w/assist (OT) Description: LTG: Patient will perform tub/shower transfers with assist, with/without cues using equipment (OT) Flowsheets (Taken 04/26/2022 1932) LTG: Pt will perform tub/shower stall transfers with assistance level of: Contact Guard/Touching assist   Problem: RH Memory Goal: LTG Patient will demonstrate ability for day to day recall/carry over during activities of daily living with assistance level (OT) Description: LTG:  Patient will demonstrate ability for day to day recall/carry over during activities of daily living with assistance level (OT). Flowsheets (Taken 04/26/2022 1932) LTG:  Patient will demonstrate ability for day to day recall/carry over during activities of daily living with  assistance level (OT): Moderate Assistance - Patient 50 - 74%   Problem: RH Attention Goal: LTG Patient will demonstrate this level of attention during functional activites (OT) Description: LTG:  Patient will demonstrate this level of attention during functional activites  (OT) Flowsheets (Taken 04/26/2022 1932) Patient will demonstrate this level of attention during functional activites: Sustained LTG: Patient will demonstrate this level of attention during functional activites (OT): Minimal Assistance - Patient > 75%

## 2022-04-26 NOTE — Progress Notes (Signed)
Physical Therapy Session Note  Patient Details  Name: Eugene [name] MRN: [id number] Date of Birth: 1936/04/11  Today's Date: 04/26/2022 PT Individual Time: 0900-1000 PT Individual Time Calculation (min): 60 min   Short Term Goals: Week 2:  PT Short Term Goal 1 (Week 2): STG=LTG due to ELOS.  Skilled Therapeutic Interventions/Progress Updates: Pt presents semi-reclined in bed and required encouragement to participate in therapy, c/o pain L shoulder.  Pt required total A to thread pants over feet, but pulls to hips.  Pt able to bridge for PT to complete pants over hips.  Pt required min A for sup to sit transfer, c/o L shoulder and hip/leg pain.  Pt transfers sit to stand w/ CGA and then amb x 5' to w/c including turn.  Pt performed combing hair in mirror.  Pt wheeled to small gym for energy conservation.  Pt transferred sit to stand x 4, although unable to amb 2/2 L hip pain.  Pt performed calf raises 3 x 20, LAQ, abd/add and isometric add 3 x 10-15.  Pt requires rest breaks 2/2 pain.  Pt returned to room and remained sitting in TIS w/ chair alarm on.  Care handed off to ST.     Therapy Documentation Precautions:  Precautions Precautions: Fall Restrictions Weight Bearing Restrictions: No General:   Vital Signs:  Pain:7/10      Therapy/Group: Individual Therapy  Lucio Edward 04/26/2022, 10:05 AM

## 2022-04-26 NOTE — Progress Notes (Signed)
PROGRESS NOTE   Subjective/Complaints:  Had a fair night. Resting when I came in. Left shoulder still sore. Reported left hip pain also yesterday  ROS: Patient denies fever, rash, sore throat, blurred vision, dizziness, nausea, vomiting, diarrhea, cough, shortness of breath or chest pain,  headache, or mood change.   Objective:   DG HIP UNILAT WITH PELVIS 2-3 VIEWS LEFT  Result Date: 04/25/2022 CLINICAL DATA:  Pain after exercise and physical therapy EXAM: DG HIP (WITH OR WITHOUT PELVIS) 2-3V LEFT COMPARISON:  Radiograph 10/25/2021 FINDINGS: No acute fracture or dislocation. Chronic posttraumatic change and heterotopic ossification about the greater trochanter. Advanced osteoarthritis of the left hip with complete loss of joint space. Less marked though still advanced osteoarthritis of the right hip. Postoperative changes of hernia repair. Vascular stents. Vascular calcifications. IMPRESSION: No definite acute fracture. 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 Electronically Signed   By: Minerva Fester M.D.   On: 04/25/2022 17:10   DG Shoulder Left  Result Date: 04/25/2022 CLINICAL DATA:  Pain, worsened with exercise EXAM: LEFT SHOULDER - 2+ VIEW COMPARISON:  03/30/2021 FINDINGS: Internal rotation, external rotation, and transscapular views of the left shoulder are obtained on 3 images. No acute fracture, subluxation, or dislocation. There is moderate osteoarthritis of the glenohumeral joint. Mild hypertrophic changes are seen at the acromioclavicular joint. Soft tissues are unremarkable. Left chest is clear. Stable vascular calcifications in the left axilla. IMPRESSION: 1. Degenerative changes of the acromioclavicular and glenohumeral joints. No acute bony abnormality. Electronically Signed   By: Sharlet Salina M.D.   On: 04/25/2022 17:09   No results for input(s): "WBC", "HGB", "HCT", "PLT" in the last 72 hours.   No  results for input(s): "NA", "K", "CL", "CO2", "GLUCOSE", "BUN", "CREATININE", "CALCIUM" in the last 72 hours.    Intake/Output Summary (Last 24 hours) at 04/26/2022 0951 Last data filed at 04/26/2022 0839 Gross per 24 hour  Intake 840 ml  Output 200 ml  Net 640 ml         Physical Exam: Vital Signs Blood pressure (!) 149/83, pulse 66, temperature 97.6 F (36.4 C), temperature source Oral, resp. rate 18, height 6' (1.829 m), weight 76.6 kg, SpO2 97 %.   Constitutional: No distress . Vital signs reviewed. HEENT: NCAT, EOMI, oral membranes moist Neck: supple Cardiovascular: RRR without murmur. No JVD    Respiratory/Chest: CTA Bilaterally without wheezes or rales. Normal effort    GI/Abdomen: BS +, non-tender, non-distended Ext: no clubbing, cyanosis, or edema Psych: pleasant and cooperative  Neuro: A, oriented to hospital, month/year with extra time. Follows commands.  MSK: Moving all 4 limbs antigravity but limited with ROM left shoulder. Left hip pain with PROM today also Skin: a few scattered bruises and lacs, hematoma right forearm. Chronic venous changes bilateral LE's. Other wounds healing/resolving.     Assessment/Plan: 1. Functional deficits which require 3+ hours per day of interdisciplinary therapy in a comprehensive inpatient rehab setting. Physiatrist is providing close team supervision and 24 hour management of active medical problems listed below. Physiatrist and rehab team continue to assess barriers to discharge/monitor patient progress toward functional and medical goals  Care Tool:  Bathing  Body parts bathed by patient: Chest, Abdomen, Front perineal area, Right upper leg, Left upper leg, Face, Left arm   Body parts bathed by helper: Right arm, Buttocks, Right lower leg, Left lower leg     Bathing assist Assist Level: Moderate Assistance - Patient 50 - 74%     Upper Body Dressing/Undressing Upper body dressing   What is the patient wearing?: Pull  over shirt    Upper body assist Assist Level: Supervision/Verbal cueing    Lower Body Dressing/Undressing Lower body dressing      What is the patient wearing?: Pants, Incontinence brief     Lower body assist Assist for lower body dressing: Maximal Assistance - Patient 25 - 49%     Toileting Toileting    Toileting assist Assist for toileting: Moderate Assistance - Patient 50 - 74%     Transfers Chair/bed transfer  Transfers assist  Chair/bed transfer activity did not occur: Safety/medical concerns  Chair/bed transfer assist level: Contact Guard/Touching assist Chair/bed transfer assistive device: Geologist, engineering   Ambulation assist      Assist level: Contact Guard/Touching assist Assistive device: Walker-rolling Max distance: >250 ft   Walk 10 feet activity   Assist  Walk 10 feet activity did not occur: Safety/medical concerns (decreased activity tolerance)  Assist level: Contact Guard/Touching assist Assistive device: Walker-rolling   Walk 50 feet activity   Assist Walk 50 feet with 2 turns activity did not occur: Safety/medical concerns  Assist level: Contact Guard/Touching assist Assistive device: Walker-rolling    Walk 150 feet activity   Assist Walk 150 feet activity did not occur: Safety/medical concerns  Assist level: Contact Guard/Touching assist Assistive device: Walker-rolling    Walk 10 feet on uneven surface  activity   Assist Walk 10 feet on uneven surfaces activity did not occur: Safety/medical concerns         Wheelchair     Assist Is the patient using a wheelchair?: Yes Type of Wheelchair: Manual    Wheelchair assist level: Dependent - Patient 0% Max wheelchair distance: >150 ft    Wheelchair 50 feet with 2 turns activity    Assist        Assist Level: Dependent - Patient 0%   Wheelchair 150 feet activity     Assist      Assist Level: Dependent - Patient 0%   Blood pressure  (!) 149/83, pulse 66, temperature 97.6 F (36.4 C), temperature source Oral, resp. rate 18, height 6' (1.829 m), weight 76.6 kg, SpO2 97 %.  Medical Problem List and Plan: 1. Functional deficits secondary to Left temporal contusion             -patient may  shower             -ELOS/Goals: 12-14d, minA/Sup   -Continue CIR therapies including PT, OT, and SLP   2.  Antithrombotics: -DVT/anticoagulation:  Mechanical: Sequential compression devices, below knee Bilateral lower extremities 11.23- Eliquis restarted after head CT- per NSU             -antiplatelet therapy: N/a 3. Pain Management: left shoulder and hip pain are likely chronic. Do not want to add sedating medications to regimen. Continue the following:   -  kpad  -scheduled tylenol  -pt refusing voltaren gel. 4. Mood/Behavior/Sleep: LCSW to follow for evaluation and support.             --Continue melatonin with Trazodone prn for insomnia.             -  antipsychotic agents:   Zyprexa.    - sleeping better with increased zyprexa 5mg   5. Neuropsych/cognition: This patient is not quite capable of making decisions on his own behalf. 6. Skin/Wound Care: Routine pressure relief measures.  to multiple wounds  -left forearm wound healing, no cellulitis there 7. Fluids/Electrolytes/Nutrition: Monitor I/O.   -encourage PO -labs reasonable this morning -protein supp added for low albumin  8. CAF: Monitor HR TID-- .              --CT with improvement    -Eliquis restarted per NSU   -no bleeding sequelae 9. H/o GERD: Continue Pepcid--resume PPI. 10. HTN: Monitor BP TID--on nimotop thru 12/05. Continue Lopressor.      11/30 bp fairly controlled with lopressor 25mg  bid 11. Left moderate pleural effusion/Emphysema per X rays: Flutter valve added for pulmonary hygiene.   -encouraged IS 12. Oral secretions: Continue scopolamine patch.   13. Seizure Hx: Keppra resumed as he was on this previously 14. Hx AAA, control BP  15. Orthostatic  hypotension  11/27 bp's better with reduced lopressor--obsv  LOS: 10 days A FACE TO FACE EVALUATION WAS PERFORMED  04/26/2022, 9:51 AM

## 2022-04-26 NOTE — Progress Notes (Signed)
Speech Language Pathology Daily Session Note  Patient Details  Name: Eugene [name] MRN: [id number] Date of Birth: 23-Dec-1935  Today's Date: 04/26/2022 SLP Individual Time: 1000-1028 SLP Individual Time Calculation (min): 28 min  Short Term Goals: Week 2: SLP Short Term Goal 1 (Week 2): Pt will tolerate regular diet with thin liquids with minimal s/sx conccerning for airway invasion and stable medical status with Mod I. SLP Short Term Goal 2 (Week 2): Pt will demonstrate orientation x 4 with Sup A. SLP Short Term Goal 3 (Week 2): Pt will demonstrate increased intellectual and emergent awareness by naming 2 deficits s/p TBI with Mod A. SLP Short Term Goal 4 (Week 2): Patient will demonstrate sustained attention to functional tasks for 30 minutes with Min verbal cues for redirection. SLP Short Term Goal 5 (Week 2): Pt will communicate wants, needs, and ideas ~90% of the time given Min A verbal cues for word-finding. SLP Short Term Goal 6 (Week 2): Patient will self-monitor and correct verbal errors with Mod multimodal cues.  Skilled Therapeutic Interventions: Skilled treatment session focused on cognitive goals. SLP facilitated session by providing overall Min a verbal and visual cues for functional problem solving during a basic money management task. Patient demonstrated sustained attention to task for ~20 minutes with supervision level verbal cues. Patient participated in a basic conversation regarding events from previous therapy sessions with Min verbal cues for word-finding. Patient left upright in wheelchair with alarm on and all needs within reach. Continue with current plan of care.      Pain No/Denies Pain   Therapy/Group: Individual Therapy  Kamau Weatherall 04/26/2022, 3:39 PM

## 2022-04-26 NOTE — Progress Notes (Shared)
Occupational Therapy Discharge Summary  Patient Details  Name: Eugene [name] MRN: [id number] Date of Birth: 1936/05/05  Date of Discharge from OT service:May 01, 2022   Patient has met {NUMBERS 0-12:18577} of {NUMBERS 0-12:18577} long term goals due to improved activity tolerance, improved balance, postural control, ability to compensate for deficits, functional use of  LEFT upper and LEFT lower extremity, improved attention, improved awareness, and improved coordination.  Patient to discharge at [facility] Assist level.  Patient's care partner is independent to provide the necessary cognitive assistance at discharge.  Pt family has attended family education and a written handout has been provided for continuity of caregivers who will be assisting with pt.   Reasons goals not met: ***   Recommendation:  Patient will benefit from ongoing skilled OT services in home health setting to continue to advance functional skills in the area of BADL and Reduce care partner burden.  Equipment: No equipment provided  Reasons for discharge: treatment goals met and discharge from hospital  Patient/family agrees with progress made and goals achieved: Yes  OT Discharge Precautions/Restrictions  Precautions Precautions: Fall Restrictions Weight Bearing Restrictions: No General   Vital Signs Therapy Vitals Temp: 98.2 F (36.8 C) Temp Source: Oral Pulse Rate: 67 Resp: 18 BP: 119/60 Patient Position (if appropriate): Lying Oxygen Therapy SpO2: 96 % O2 Device: Room Air Pain   ADL ADL Eating: Supervision/safety Where Assessed-Eating: Chair Grooming: Supervision/safety Where Assessed-Grooming: Edge of bed Upper Body Bathing: Supervision/safety Where Assessed-Upper Body Bathing: Edge of bed Lower Body Bathing: Moderate assistance Where Assessed-Lower Body Bathing: Edge of bed Upper Body Dressing: Supervision/safety Where Assessed-Upper Body Dressing: Edge of bed Lower Body  Dressing: Moderate assistance, Maximal assistance Where Assessed-Lower Body Dressing: Edge of bed Toileting: Moderate assistance Where Assessed-Toileting: Bedside Commode Toilet Transfer: Therapist, music Method: Stand pivot Nutritional therapist Method: Print production planner with back Vision Baseline Vision/History: 1 Wears glasses Patient Visual Report: No change from baseline Vision Assessment?: No apparent visual deficits Perception  Perception: Within Functional Limits Praxis Praxis: Impaired Praxis Impairment Details: Motor planning Cognition Cognition Overall Cognitive Status: Impaired/Different from baseline Arousal/Alertness: Awake/alert Orientation Level: Person;Place;Situation Person: Oriented Place: Oriented Situation: Oriented Memory: Impaired Decreased Short Term Memory: Verbal basic Attention: Sustained Sustained Attention: Impaired Awareness: Impaired Awareness Impairment: Emergent impairment Problem Solving: Impaired Behaviors: Poor frustration tolerance Safety/Judgment: Impaired Brief Interview for Mental Status (BIMS) Repetition of Three Words (First Attempt): 3 Temporal Orientation: Year: Missed by 2 to 5 years Temporal Orientation: Month: Missed by 6 days to 1 month Temporal Orientation: Day: Incorrect Recall: "Sock": Yes, after cueing ("something to wear") Recall: "Blue": Yes, after cueing ("a color") Recall: "Bed": No, could not recall BIMS Summary Score: 7 Sensation Sensation Light Touch: Impaired Detail Peripheral sensation comments: impaired B LE's distal to the knee (chronic per pt) Light Touch Impaired Details: Impaired RLE;Impaired LLE Hot/Cold: Not tested Proprioception: Appears Intact Stereognosis: Not tested Coordination Gross Motor Movements are Fluid and Coordinated: No Fine Motor Movements are Fluid and Coordinated: No Coordination and Movement  Description: remains deconditioned and limited by pain, motor planning with mobilty improved since eval--but remains impaired Motor  Motor Motor: Abnormal postural alignment and control;Motor apraxia Mobility  Bed Mobility Rolling Right: Supervision/verbal cueing Rolling Left: Supervision/Verbal cueing Supine to Sit: Minimal Assistance - Patient > 75% Sit to Supine: Minimal Assistance - Patient > 75% Transfers Sit to Stand: Contact Guard/Touching assist Stand to Sit: Contact Guard/Touching assist  Trunk/Postural Assessment  Cervical Assessment Cervical Assessment: Exceptions to [facility] Thoracic Assessment Thoracic Assessment: Exceptions to [facility] Lumbar Assessment Lumbar Assessment: Exceptions to [facility] Postural Control Postural Control: Deficits on evaluation (posterior bias)  Balance Static Sitting Balance Static Sitting - Balance Support: Bilateral upper extremity supported Static Sitting - Level of Assistance: 5: Stand by assistance Dynamic Sitting Balance Dynamic Sitting - Level of Assistance: 5: Stand by assistance Static Standing Balance Static Standing - Level of Assistance: 5: Stand by assistance Dynamic Standing Balance Dynamic Standing - Level of Assistance: 4: Min assist Extremity/Trunk Assessment RUE Assessment RUE Assessment: Within Functional Limits LUE Assessment LUE Assessment: Exceptions to [facility] Strength Comments: continues to be limited by pain- movement 30-40 degrees in all planes in shoulder, wrist and hand [facility]   [name] 04/26/2022, 7:46 PM

## 2022-04-27 MED ORDER — LIDOCAINE HCL (PF) 1 % IJ SOLN
5.0000 mL | Freq: Once | INTRAMUSCULAR | Status: AC
  Administered 2022-04-30: 5 mL
  Filled 2022-04-27: qty 30

## 2022-04-27 MED ORDER — LIDOCAINE HCL (CARDIAC) PF 100 MG/5ML IV SOSY
PREFILLED_SYRINGE | INTRAVENOUS | Status: AC
  Filled 2022-04-27: qty 5

## 2022-04-27 MED ORDER — TRIAMCINOLONE ACETONIDE 40 MG/ML IJ SUSP
40.0000 mg | Freq: Once | INTRAMUSCULAR | Status: DC
  Filled 2022-04-27 (×2): qty 1

## 2022-04-27 NOTE — Progress Notes (Signed)
Patient ID: Eugene [name], male   DOB: 12-18-1935, 86 y.o.   MRN: [id number]  SW called pt dtr [name] [phone]) to confirm family edu. Unable to leave message as voicemail box is full. SW will continue to make efforts to contact.   *SW received return phone call from pt dtr [name] who confirms faily edu, and asked if her father could have a cortisone shot before he leaves as he has been complaining about hip pain. SW shared will share with medical team.   SW updated physicians on request.   Cecile Sheerer, MSW, LCSWA Office: [phone] Cell: [phone] Fax: [phone]

## 2022-04-27 NOTE — Progress Notes (Signed)
Occupational Therapy Session Note  Patient Details  Name: Eugene [name] MRN: [id number] Date of Birth: February 20, 1936  Today's Date: 04/27/2022 OT Individual Time: 1140-1210 OT Individual Time Calculation (min): 30 min  and Today's Date: 04/27/2022 OT Missed Time: 30 Minutes Missed Time Reason: Patient unwilling/refused to participate without medical reason;Patient fatigue   Short Term Goals: Week 2:  OT Short Term Goal 1 (Week 2): STG=LTG d/t ELOS  Skilled Therapeutic Interventions/Progress Updates:   OT entered room at start time, pt sleeping soundly in the recliner. He arose briefly to state "no, I'm too tired" when OT provided options for session. Education provided but pt adamantly declining. Gave pt a 30 min rest and upon return he was more alert with his daughter present. He reported need to use the bathroom. He required mod cueing to scoot forward in the chair instead of standing immediately which made standing very difficulty. After scooting he was able to stand with CGA. Cueing required for anterior weight shift as pt remaining on heels. He completed an ambulatory transfer into the bathroom with the RW with close CGA. Improved sequencing during turn to the toilet. Pt required mod A with toileting tasks d/t poor awareness of BM and not wanting to wipe. Foam pad changed on his bottom. Roho cushion taken from w/c and put on recliner for ongoing pressure relief from sacrum. Pt was left sitting up in the recliner with all needs met, chair alarm set, and call bell within reach.    Therapy Documentation Precautions:  Precautions Precautions: Fall Restrictions Weight Bearing Restrictions: No  Therapy/Group: Individual Therapy  Curtis Sites 04/27/2022, 6:21 AM

## 2022-04-27 NOTE — Progress Notes (Signed)
Speech Language Pathology TBI Note  Patient Details  Name: Eugene [name] MRN: [id number] Date of Birth: 03-12-1936  Today's Date: 04/27/2022 SLP Individual Time: 5003-7048 SLP Individual Time Calculation (min): 45 min  Short Term Goals: Week 2: SLP Short Term Goal 1 (Week 2): Pt will tolerate regular diet with thin liquids with minimal s/sx conccerning for airway invasion and stable medical status with Mod I. SLP Short Term Goal 2 (Week 2): Pt will demonstrate orientation x 4 with Sup A. SLP Short Term Goal 3 (Week 2): Pt will demonstrate increased intellectual and emergent awareness by naming 2 deficits s/p TBI with Mod A. SLP Short Term Goal 4 (Week 2): Patient will demonstrate sustained attention to functional tasks for 30 minutes with Min verbal cues for redirection. SLP Short Term Goal 5 (Week 2): Pt will communicate wants, needs, and ideas ~90% of the time given Min A verbal cues for word-finding. SLP Short Term Goal 6 (Week 2): Patient will self-monitor and correct verbal errors with Mod multimodal cues.  Skilled Therapeutic Interventions: Skilled treatment session focused on dysphagia and cognitive goals. Upon arrival, patient was lethargic while upright in bed. Patient with increased difficulty with word-finding as well as increased phonemic paraphasias and neologisms, suspect due to fatigue. Patient able to self-correct with overall Mod-Max sentence completion cues. Mod A verbal cues were also needed for use of external aids for recall of place, time, and situation. Patient consumed his breakfast meal of regular textures with thin liquids with overt cough X1, suspect due to bolus size. Recommend patient continue current diet. Patient left upright in bed with alarm on and all needs within reach. Continue with current plan of care.      Pain Pain in left shoulder with movement. Patient premedicated   Agitated Behavior Scale: TBI  ABS discontinued d/t ABS score less than 20 for  the last three days or no behaviors present   Therapy/Group: Individual Therapy  Velia Pamer 04/27/2022, 10:47 AM

## 2022-04-27 NOTE — Progress Notes (Signed)
PROGRESS NOTE   Subjective/Complaints:  No major issues last night. Appears to have slept most of it. Tends to be more confused in evening.   ROS: Patient denies fever, rash, sore throat, blurred vision, dizziness, nausea, vomiting, diarrhea, cough, shortness of breath or chest pain, joint or back/neck pain, headache, or mood change.   Objective:   DG HIP UNILAT WITH PELVIS 2-3 VIEWS LEFT  Result Date: 04/25/2022 CLINICAL DATA:  Pain after exercise and physical therapy EXAM: DG HIP (WITH OR WITHOUT PELVIS) 2-3V LEFT COMPARISON:  Radiograph 10/25/2021 FINDINGS: No acute fracture or dislocation. Chronic posttraumatic change and heterotopic ossification about the greater trochanter. Advanced osteoarthritis of the left hip with complete loss of joint space. Less marked though still advanced osteoarthritis of the right hip. Postoperative changes of hernia repair. Vascular stents. Vascular calcifications. IMPRESSION: No definite acute fracture. 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 Electronically Signed   By: Minerva Fester M.D.   On: 04/25/2022 17:10   DG Shoulder Left  Result Date: 04/25/2022 CLINICAL DATA:  Pain, worsened with exercise EXAM: LEFT SHOULDER - 2+ VIEW COMPARISON:  03/30/2021 FINDINGS: Internal rotation, external rotation, and transscapular views of the left shoulder are obtained on 3 images. No acute fracture, subluxation, or dislocation. There is moderate osteoarthritis of the glenohumeral joint. Mild hypertrophic changes are seen at the acromioclavicular joint. Soft tissues are unremarkable. Left chest is clear. Stable vascular calcifications in the left axilla. IMPRESSION: 1. Degenerative changes of the acromioclavicular and glenohumeral joints. No acute bony abnormality. Electronically Signed   By: Sharlet Salina M.D.   On: 04/25/2022 17:09   No results for input(s): "WBC", "HGB", "HCT", "PLT" in the last  72 hours.   No results for input(s): "NA", "K", "CL", "CO2", "GLUCOSE", "BUN", "CREATININE", "CALCIUM" in the last 72 hours.    Intake/Output Summary (Last 24 hours) at 04/27/2022 0928 Last data filed at 04/27/2022 0847 Gross per 24 hour  Intake 1320 ml  Output --  Net 1320 ml         Physical Exam: Vital Signs Blood pressure 123/62, pulse 73, temperature 98.2 F (36.8 C), temperature source Oral, resp. rate 16, height 6' (1.829 m), weight 80.1 kg, SpO2 98 %.   Constitutional: No distress . Vital signs reviewed. HEENT: NCAT, EOMI, oral membranes moist Neck: supple Cardiovascular: RRR without murmur. No JVD    Respiratory/Chest: CTA Bilaterally without wheezes or rales. Normal effort    GI/Abdomen: BS +, non-tender, non-distended Ext: no clubbing, cyanosis, or edema Psych: pleasant and cooperative  Neuro: A, oriented to hospital, month/year with cueing/time. Follows commands.  MSK: Moving all 4 limbs antigravity but limited with ROM left shoulder. Left hip pain present Skin: improvingbruises and lacs, hematoma right forearm. Chronic venous changes bilateral LE's. Other wounds healing/resolving.     Assessment/Plan: 1. Functional deficits which require 3+ hours per day of interdisciplinary therapy in a comprehensive inpatient rehab setting. Physiatrist is providing close team supervision and 24 hour management of active medical problems listed below. Physiatrist and rehab team continue to assess barriers to discharge/monitor patient progress toward functional and medical goals  Care Tool:  Bathing    Body parts bathed  by patient: Chest, Abdomen, Front perineal area, Right upper leg, Left upper leg, Face, Left arm, Buttocks   Body parts bathed by helper: Right arm, Right lower leg, Left lower leg     Bathing assist Assist Level: Minimal Assistance - Patient > 75%     Upper Body Dressing/Undressing Upper body dressing   What is the patient wearing?: Pull over shirt     Upper body assist Assist Level: Supervision/Verbal cueing    Lower Body Dressing/Undressing Lower body dressing      What is the patient wearing?: Pants, Incontinence brief     Lower body assist Assist for lower body dressing: Moderate Assistance - Patient 50 - 74%     Toileting Toileting    Toileting assist Assist for toileting: Contact Guard/Touching assist     Transfers Chair/bed transfer  Transfers assist  Chair/bed transfer activity did not occur: Safety/medical concerns  Chair/bed transfer assist level: Contact Guard/Touching assist Chair/bed transfer assistive device: Programmer, multimedia   Ambulation assist      Assist level: Contact Guard/Touching assist Assistive device: Walker-rolling Max distance: 5   Walk 10 feet activity   Assist  Walk 10 feet activity did not occur: Safety/medical concerns (decreased activity tolerance)  Assist level: Contact Guard/Touching assist Assistive device: Walker-rolling   Walk 50 feet activity   Assist Walk 50 feet with 2 turns activity did not occur: Safety/medical concerns  Assist level: Contact Guard/Touching assist Assistive device: Walker-rolling    Walk 150 feet activity   Assist Walk 150 feet activity did not occur: Safety/medical concerns  Assist level: Contact Guard/Touching assist Assistive device: Walker-rolling    Walk 10 feet on uneven surface  activity   Assist Walk 10 feet on uneven surfaces activity did not occur: Safety/medical concerns         Wheelchair     Assist Is the patient using a wheelchair?: Yes Type of Wheelchair: Manual    Wheelchair assist level: Dependent - Patient 0% Max wheelchair distance: >150 ft    Wheelchair 50 feet with 2 turns activity    Assist        Assist Level: Dependent - Patient 0%   Wheelchair 150 feet activity     Assist      Assist Level: Dependent - Patient 0%   Blood pressure 123/62, pulse 73,  temperature 98.2 F (36.8 C), temperature source Oral, resp. rate 16, height 6' (1.829 m), weight 80.1 kg, SpO2 98 %.  Medical Problem List and Plan: 1. Functional deficits secondary to Left temporal contusion             -patient may  shower             -ELOS/Goals: 12-14d, minA/Sup   -Continue CIR therapies including PT, OT, and SLP   -pt can be self-limiting with therapy 2.  Antithrombotics: -DVT/anticoagulation:  Mechanical: Sequential compression devices, below knee Bilateral lower extremities 11.23- Eliquis restarted after head CT- per NSU             -antiplatelet therapy: N/a 3. Pain Management: left shoulder and hip pain are likely chronic. Do not want to add sedating medications to regimen. Continue the following:   -  kpad  -scheduled tylenol  -pt refusing voltaren gel. 4. Mood/Behavior/Sleep: LCSW to follow for evaluation and support.             --Continue melatonin with Trazodone prn for insomnia.             -  antipsychotic agents:   Zyprexa.    - sleeping better with increased zyprexa 5mg   5. Neuropsych/cognition: This patient is not quite capable of making decisions on his own behalf. 6. Skin/Wound Care: Routine pressure relief measures.  to multiple wounds  -left forearm wound healing, no cellulitis there 7. Fluids/Electrolytes/Nutrition: Monitor I/O.   -encourage PO -labs reasonable this morning -protein supp added for low albumin  8. CAF: Monitor HR TID-- .              --CT with improvement    -Eliquis restarted per NSU   -no bleeding sequelae 9. H/o GERD: Continue Pepcid--resume PPI. 10. HTN: Monitor BP TID--on nimotop thru 12/05. Continue Lopressor.      12/1 bp fairly controlled with lopressor 25mg  bid 11. Left moderate pleural effusion/Emphysema per X rays: Flutter valve added for pulmonary hygiene.   -encouraged IS 12. Oral secretions: Continue scopolamine patch.   13. Seizure Hx: Keppra resumed as he was on this previously 14. Hx AAA, control BP   15. Orthostatic hypotension  12/1 bp's better with reduced lopressor--no reports of orthostasis  LOS: 11 days A FACE TO [place] 04/27/2022, 9:28 AM

## 2022-04-27 NOTE — Progress Notes (Signed)
Physical Therapy Session Note  Patient Details  Name: Eugene [name] MRN: [id number] Date of Birth: 01/11/1936  Today's Date: 04/27/2022 PT Individual Time: 0950-1016 PT Individual Time Calculation (min): 26 min   Short Term Goals: Week 1:  PT Short Term Goal 1 (Week 1): Patient will perform bed mobility with supervision consistently. PT Short Term Goal 1 - Progress (Week 1): Met PT Short Term Goal 2 (Week 1): Patient will perform basic transfers with min A using LRAD. PT Short Term Goal 2 - Progress (Week 1): Met PT Short Term Goal 3 (Week 1): Patient will ambulate >50 ft using LRAD with min A. PT Short Term Goal 3 - Progress (Week 1): Met Week 2:  PT Short Term Goal 1 (Week 2): STG=LTG due to ELOS. Week 3:     Skilled Therapeutic Interventions/Progress Updates:   Pt received supine in bed and agreeable to PT. Supine>sit transfer with min assist and cues for safety EOB.   Sit<>stand from bed x 3 to doff soiled brief and don clean brief and pants.   Stand pivot trasfers with min assist x 2   Patient returned to room and performed stand pivot to recliner with ***. Pt left sitting in recliner with call bell in reach and all needs met.         Therapy Documentation Precautions:  Precautions Precautions: Fall Restrictions Weight Bearing Restrictions: No General:   Vital Signs:   Pain:   Mobility:   Locomotion :    Trunk/Postural Assessment :    Balance:   Exercises:   Other Treatments:      Therapy/Group: Individual Therapy   E  04/27/2022, 10:38 AM  

## 2022-04-28 DIAGNOSIS — M19012 Primary osteoarthritis, left shoulder: Secondary | ICD-10-CM

## 2022-04-28 NOTE — Progress Notes (Signed)
Physical Therapy Session Note  Patient Details  Name: Eugene [name] MRN: [id number] Date of Birth: November 25, 1935  Today's Date: 04/28/2022 PT Individual Time: 1515-1530 PT Individual Time Calculation (min): 15 min- Missed 30 minutes of PT treatment session secondary to refusal and lethargy.  Short Term Goals: Week 1:  PT Short Term Goal 1 (Week 1): Patient will perform bed mobility with supervision consistently. PT Short Term Goal 1 - Progress (Week 1): Met PT Short Term Goal 2 (Week 1): Patient will perform basic transfers with min A using LRAD. PT Short Term Goal 2 - Progress (Week 1): Met PT Short Term Goal 3 (Week 1): Patient will ambulate >50 ft using LRAD with min A. PT Short Term Goal 3 - Progress (Week 1): Met Week 2:  PT Short Term Goal 1 (Week 2): STG=LTG due to ELOS.  Skilled Therapeutic Interventions/Progress Updates:  Patient greeted sitting in bedside recliner in room and not agreeable to PT treatment session- patient stated "I won't be doing anything today." Therapist offered bathing, walking, going to the restroom, transferring to bed to take a nap, etc; However, patient continued to refuse stating " I'm not moving." Therapist will attempt to see patient later this afternoon if scheduling permits and patient is willing. Patient has missed 45 minutes of PT at this time.   Therapist attempted to see patient later in the afternoon as he was requesting to use the restroom- Therapist took over from nursing as patient was sitting EOB. Patient performed sit/stand with the use of a RW and MinA. Patient ambulated x15' from his bed to bathroom with RW and CGA. Patient stood at toilet to urinate with CGA for safety and VC for not hitting his head on the shelf above him. Patient ambulated to bedside recliner where he requested to sit, however patient appeared very fatigued and educated on importance of getting to bed to take a nap. Patient agreeable and stood from bedside recliner with RW  and CGA and performed stand pivot transfer to EOB with CGA and VC for keeping the RW with him throughout the transfer. Patient transitioned from sitting EOB to supine with CGA for guiding trunk. Patient left supine in bed with bed alarm on, call bell within reach and caregiver present. Patient reports hip pain during treatment session- RN notified and aware.    Therapy Documentation Precautions:  Precautions Precautions: Fall Restrictions Weight Bearing Restrictions: No  Therapy/Group: Individual Therapy  Jareli Highland 04/28/2022, 7:50 AM

## 2022-04-28 NOTE — Plan of Care (Signed)
  Problem: Consults Goal: RH BRAIN INJURY PATIENT EDUCATION Description: Description: See Patient Education module for eduction specifics Outcome: Progressing   Problem: RH BOWEL ELIMINATION Goal: RH STG MANAGE BOWEL WITH ASSISTANCE Description: STG Manage Bowel with mod I Assistance. Outcome: Progressing Goal: RH STG MANAGE BOWEL W/MEDICATION W/ASSISTANCE Description: STG Manage Bowel with Medication with mod I Assistance. Outcome: Progressing   Problem: RH BLADDER ELIMINATION Goal: RH STG MANAGE BLADDER WITH ASSISTANCE Description: STG Manage Bladder With toileting Assistance Outcome: Progressing Goal: RH STG MANAGE BLADDER WITH MEDICATION WITH ASSISTANCE Description: STG Manage Bladder With Medication With mod I Assistance. Outcome: Progressing   Problem: RH SAFETY Goal: RH STG ADHERE TO SAFETY PRECAUTIONS W/ASSISTANCE/DEVICE Description: STG Adhere to Safety Precautions With cues Assistance/Device. Outcome: Not Progressing Note: Patient needs constant redirection. Remains impulsive when patient needs to toilet. Educated to use of call bell.   Problem: RH COGNITION-NURSING Goal: RH STG USES MEMORY AIDS/STRATEGIES W/ASSIST TO PROBLEM SOLVE Description: STG Uses Memory Aids/Strategies With cues Assistance to Problem Solve. Outcome: Progressing   Problem: RH KNOWLEDGE DEFICIT BRAIN INJURY Goal: RH STG INCREASE KNOWLEDGE OF SELF CARE AFTER BRAIN INJURY Description: Family and caregivers will be able to manage care at discharge using educational handouts independently Outcome: Progressing   Problem: Education: Goal: Knowledge of disease or condition will improve Outcome: Progressing Goal: Knowledge of secondary prevention will improve (MUST DOCUMENT ALL) Outcome: Progressing Goal: Knowledge of patient specific risk factors will improve Loraine Leriche N/A or DELETE if not current risk factor) Outcome: Progressing   Problem: Ischemic Stroke/TIA Tissue Perfusion: Goal: Complications  of ischemic stroke/TIA will be minimized Outcome: Progressing   Problem: Coping: Goal: Will verbalize positive feelings about self Outcome: Progressing Goal: Will identify appropriate support needs Outcome: Progressing   Problem: Health Behavior/Discharge Planning: Goal: Ability to manage health-related needs will improve Outcome: Progressing Goal: Goals will be collaboratively established with patient/family Outcome: Progressing   Problem: Self-Care: Goal: Ability to participate in self-care as condition permits will improve Outcome: Progressing Goal: Verbalization of feelings and concerns over difficulty with self-care will improve Outcome: Progressing Goal: Ability to communicate needs accurately will improve Outcome: Progressing

## 2022-04-28 NOTE — Progress Notes (Signed)
Occupational Therapy Session Note  Patient Details  Name: Eugene [name] MRN: [id number] Date of Birth: 05-26-1936  Today's Date: 04/28/2022 OT Individual Time: 0945-1000 OT Individual Time Calculation (min): 15 min Patient missed 30 minutes of session as she stated he was too fatigued to continue in the session after the 15 minutes.   He required max encouragement and time to participate/engage for 15 minutes.   Short Term Goals: Week 1:  OT Short Term Goal 1 (Week 1): Pt will complete 2/3 steps of donning pants OT Short Term Goal 1 - Progress (Week 1): Progressing toward goal OT Short Term Goal 2 (Week 1): Pt will complete toilet transfer with LRAD at CGA level OT Short Term Goal 2 - Progress (Week 1): Met OT Short Term Goal 3 (Week 1): Pt will don shirt EOB with no A for sitting balance OT Short Term Goal 3 - Progress (Week 1): Met OT Short Term Goal 4 (Week 1): Pt will bathe with MIN A at sit to stand level OT Short Term Goal 4 - Progress (Week 1): Progressing toward goal  Skilled Therapeutic Interventions/Progress Updates:    Clinician attempted for some time to build rapport and offered to assist patient in many ways, such as bathing, dressing (emphasized that his daughter may like to see him in clean clothing and sitting up when she comes to visit),  opportunity to move around and sit in recliner, sit on edge of bed, and other offers.   He stated he was interested in none except to change his undershirt and don a clean outer shirt.  - He doffed outer button up shirt overhead with moderate assistance and complained of pain in bilateral shoulders and worse in left shoulder (With prompting he pressed call bell and asked for pain meds)  -Once outer button up shirt was doffed, he stated that it might hurt to much to don another outer shirt and that he preferred to stay in bed with his same undershirt and rest because he was tired.  -He combed his hair once handed a comb.  At end of  session, as he requested, he was left alone to rest.   He demonstrated use of call bell in case he needed assistance or wanted to get out of bed and into recliner, for toileting or other.  Bed alarm was re engaged.  Continue patient's Plan of Care   Therapy Documentation Precautions:  Precautions Precautions: Fall Restrictions Weight Bearing Restrictions: No   Pain:in bilateral shoulders and reported worse in left.  "A lot of pain" in left shoulder but did not more specificially describe.  Called and requested pain med from nursing who stated they had just given oral meds as well as topical.  Patient did not remembering having just received pain meds.    Other Treatments:     Therapy/Group: Individual Therapy  Herschell Dimes 04/28/2022, 5:25 PM

## 2022-04-28 NOTE — Progress Notes (Signed)
PROGRESS NOTE   Subjective/Complaints:  Still c/o left shoulder and hip pain with therapies  ROS: Patient denies fever, rash, sore throat, blurred vision, dizziness, nausea, vomiting, diarrhea, cough, shortness of breath or chest pain,  headache, or mood change.   Objective:   DG HIP UNILAT WITH PELVIS 2-3 VIEWS LEFT  Result Date: 04/25/2022 CLINICAL DATA:  Pain after exercise and physical therapy EXAM: DG HIP (WITH OR WITHOUT PELVIS) 2-3V LEFT COMPARISON:  Radiograph 10/25/2021 FINDINGS: No acute fracture or dislocation. Chronic posttraumatic change and heterotopic ossification about the greater trochanter. Advanced osteoarthritis of the left hip with complete loss of joint space. Less marked though still advanced osteoarthritis of the right hip. Postoperative changes of hernia repair. Vascular stents. Vascular calcifications. IMPRESSION: No definite acute fracture. 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 Electronically Signed   By: Minerva Fester M.D.   On: 04/25/2022 17:10   DG Shoulder Left  Result Date: 04/25/2022 CLINICAL DATA:  Pain, worsened with exercise EXAM: LEFT SHOULDER - 2+ VIEW COMPARISON:  03/30/2021 FINDINGS: Internal rotation, external rotation, and transscapular views of the left shoulder are obtained on 3 images. No acute fracture, subluxation, or dislocation. There is moderate osteoarthritis of the glenohumeral joint. Mild hypertrophic changes are seen at the acromioclavicular joint. Soft tissues are unremarkable. Left chest is clear. Stable vascular calcifications in the left axilla. IMPRESSION: 1. Degenerative changes of the acromioclavicular and glenohumeral joints. No acute bony abnormality. Electronically Signed   By: Sharlet Salina M.D.   On: 04/25/2022 17:09   No results for input(s): "WBC", "HGB", "HCT", "PLT" in the last 72 hours.   No results for input(s): "NA", "K", "CL", "CO2",  "GLUCOSE", "BUN", "CREATININE", "CALCIUM" in the last 72 hours.    Intake/Output Summary (Last 24 hours) at 04/27/2022 0928 Last data filed at 04/27/2022 0847 Gross per 24 hour  Intake 1320 ml  Output --  Net 1320 ml         Physical Exam: Vital Signs Blood pressure 123/62, pulse 73, temperature 98.2 F (36.8 C), temperature source Oral, resp. rate 16, height 6' (1.829 m), weight 80.1 kg, SpO2 98 %.   Constitutional: No distress . Vital signs reviewed. HEENT: NCAT, EOMI, oral membranes moist Neck: supple Cardiovascular: RRR without murmur. No JVD    Respiratory/Chest: CTA Bilaterally without wheezes or rales. Normal effort    GI/Abdomen: BS +, non-tender, non-distended Ext: no clubbing, cyanosis, or edema Psych: pleasant and cooperative  Neuro: A, oriented to hospital, month/year with cueing/time. Follows commands.  MSK: Moving all 4 limbs antigravity but limited with ROM left shoulder and Left hip d/t pain   Skin: improving bruises and lacs, hematoma right forearm. Chronic venous changes bilateral LE's. Other wounds healing/resolving.     Assessment/Plan: 1. Functional deficits which require 3+ hours per day of interdisciplinary therapy in a comprehensive inpatient rehab setting. Physiatrist is providing close team supervision and 24 hour management of active medical problems listed below. Physiatrist and rehab team continue to assess barriers to discharge/monitor patient progress toward functional and medical goals  Care Tool:  Bathing    Body parts bathed by patient: Chest, Abdomen, Front perineal area, Right upper leg,  Left upper leg, Face, Left arm, Buttocks   Body parts bathed by helper: Right arm, Right lower leg, Left lower leg     Bathing assist Assist Level: Minimal Assistance - Patient > 75%     Upper Body Dressing/Undressing Upper body dressing   What is the patient wearing?: Pull over shirt    Upper body assist Assist Level: Supervision/Verbal cueing     Lower Body Dressing/Undressing Lower body dressing      What is the patient wearing?: Pants, Incontinence brief     Lower body assist Assist for lower body dressing: Moderate Assistance - Patient 50 - 74%     Toileting Toileting    Toileting assist Assist for toileting: Contact Guard/Touching assist     Transfers Chair/bed transfer  Transfers assist  Chair/bed transfer activity did not occur: Safety/medical concerns  Chair/bed transfer assist level: Contact Guard/Touching assist Chair/bed transfer assistive device: Geologist, engineering   Ambulation assist      Assist level: Contact Guard/Touching assist Assistive device: Walker-rolling Max distance: 5   Walk 10 feet activity   Assist  Walk 10 feet activity did not occur: Safety/medical concerns (decreased activity tolerance)  Assist level: Contact Guard/Touching assist Assistive device: Walker-rolling   Walk 50 feet activity   Assist Walk 50 feet with 2 turns activity did not occur: Safety/medical concerns  Assist level: Contact Guard/Touching assist Assistive device: Walker-rolling    Walk 150 feet activity   Assist Walk 150 feet activity did not occur: Safety/medical concerns  Assist level: Contact Guard/Touching assist Assistive device: Walker-rolling    Walk 10 feet on uneven surface  activity   Assist Walk 10 feet on uneven surfaces activity did not occur: Safety/medical concerns         Wheelchair     Assist Is the patient using a wheelchair?: Yes Type of Wheelchair: Manual    Wheelchair assist level: Dependent - Patient 0% Max wheelchair distance: >150 ft    Wheelchair 50 feet with 2 turns activity    Assist        Assist Level: Dependent - Patient 0%   Wheelchair 150 feet activity     Assist      Assist Level: Dependent - Patient 0%   Blood pressure 123/62, pulse 73, temperature 98.2 F (36.8 C), temperature source Oral, resp. rate 16,  height 6' (1.829 m), weight 80.1 kg, SpO2 98 %.  Medical Problem List and Plan: 1. Functional deficits secondary to Left temporal contusion             -patient may  shower             -ELOS/Goals: 12-14d, minA/Sup   -Continue CIR therapies including PT, OT, and SLP   -left hip and shoulder pain have been limiting 2.  Antithrombotics: -DVT/anticoagulation:  Mechanical: Sequential compression devices, below knee Bilateral lower extremities 11.23- Eliquis restarted after head CT- per NSU             -antiplatelet therapy: N/a 3. Pain Management: left shoulder and hip pain are chronic.    -  kpad  -scheduled tylenol  -pt refusing voltaren gel.  -given that pain has been a barrier I injected left shoulder today. IR will attempt left hip injection as their schedule allows  After informed consent and preparation of the skin with betadine and isopropyl alcohol, I injected 40mg  kenlalog and 4cc of  PF 1% lidocaine into the left shoulder/subacromial space via lateral approach. Additionally, aspiration was performed  prior to injection. The patient tolerated well, and no complications were encountered. Afterward the area was cleaned and dressed. Post- injection instructions were provided.   4. Mood/Behavior/Sleep: LCSW to follow for evaluation and support.             --Continue melatonin with Trazodone prn for insomnia.             -antipsychotic agents:   Zyprexa.    - sleeping better with increased zyprexa 5mg   5. Neuropsych/cognition: This patient is not quite capable of making decisions on his own behalf. 6. Skin/Wound Care: Routine pressure relief measures.  to multiple wounds  -left forearm wound healing, no cellulitis there 7. Fluids/Electrolytes/Nutrition: Monitor I/O.   -encourage PO -labs reasonable this morning -protein supp added for low albumin  8. CAF: Monitor HR TID-- .              --CT with improvement    -Eliquis restarted per NSU   -no bleeding sequelae 9. H/o GERD:  Continue Pepcid--resume PPI. 10. HTN: Monitor BP TID--on nimotop thru 12/05. Continue Lopressor.      12/1 bp fairly controlled with lopressor 25mg  bid 11. Left moderate pleural effusion/Emphysema per X rays: Flutter valve added for pulmonary hygiene.   -encouraged IS 12. Oral secretions: Continue scopolamine patch.   13. Seizure Hx: Keppra resumed as he was on this previously 14. Hx AAA, control BP  15. Orthostatic hypotension  12/1 bp's better with reduced lopressor--no reports of orthostasis  LOS: 11 days A FACE TO FACE EVALUATION WAS PERFORMED  04/27/2022, 9:28 AM

## 2022-04-28 NOTE — Progress Notes (Signed)
Speech Language Pathology TBI Note  Patient Details  Name: MARKEY DEADY MRN: [id number] Date of Birth: Jun 20, 1935  Today's Date: 04/28/2022 SLP Individual Time: 1101-1130 SLP Individual Time Calculation (min): 29 min  Short Term Goals: Week 2: SLP Short Term Goal 1 (Week 2): Pt will tolerate regular diet with thin liquids with minimal s/sx conccerning for airway invasion and stable medical status with Mod I. SLP Short Term Goal 2 (Week 2): Pt will demonstrate orientation x 4 with Sup A. SLP Short Term Goal 3 (Week 2): Pt will demonstrate increased intellectual and emergent awareness by naming 2 deficits s/p TBI with Mod A. SLP Short Term Goal 4 (Week 2): Patient will demonstrate sustained attention to functional tasks for 30 minutes with Min verbal cues for redirection. SLP Short Term Goal 5 (Week 2): Pt will communicate wants, needs, and ideas ~90% of the time given Min A verbal cues for word-finding. SLP Short Term Goal 6 (Week 2): Patient will self-monitor and correct verbal errors with Mod multimodal cues.  Skilled Therapeutic Interventions:Skilled ST services focused on language skills. Pt was asleep in chair upon entering and immediately announced his head was hurting and that nursing never gave pain medication. SLP communicated with nursing staff, medication was given and SLP recorded next scheduled medication on white board in room. Pt was able to recall this with immediate recall but delayed recall supported he was not able to read sign. Pt was able to read orientation sign for place when directed but denied with situation (fall.)  SLP facilitated expressive language and error awareness in naming of common objects and function. Pt was able to name objects with 70% accuracy given min A semantic cues and name the object function with max A encouragement and extra time with 60% accuracy. Pt demonstrated x3 agreement in verbal errors, but continues to lack overall intellectual awareness.  Pt began felling a sleep and requested to end ST session. Pt missed 30 minutes of skilled ST services due to fatigue. Pt was left in room with call bell within reach and chair alarm set. SLP recommends to continue skilled services.     Pain Pain Assessment Faces Pain Scale: Hurts little more Pain Type: Acute pain Pain Location: Head Pain Intervention(s): RN made aware;Rest  Agitated Behavior Scale: TBI      Therapy/Group: Individual Therapy and MBSS/FEES  Renny Gunnarson  [facility] 04/28/2022, 12:20 PM

## 2022-04-29 NOTE — Progress Notes (Signed)
Physical Therapy Session Note  Patient Details  Name: Eugene [name] MRN: [id number] Date of Birth: [birth year]  Today's Date: 04/29/2022 PT Individual Time: 1300-1355 PT Individual Time Calculation (min): 55 min   Short Term Goals: Week 2:  PT Short Term Goal 1 (Week 2): STG=LTG due to ELOS.  Skilled Therapeutic Interventions/Progress Updates:     Patient in bed with caregiver in the room upon PT arrival. Patient alert and agreeable to PT session. Patient reported L hip pain during session, RN made aware. PT provided repositioning, rest breaks, and distraction as pain interventions throughout session.   Noted patient with increased language of confusion, word salad with expressive aphasia, and decreased orientation this session. Vitals WFL. Per RN and sleep chart, patient did not sleep last night. No change in sensation, strength, or mobility.   Therapeutic Activity: Bed Mobility: Patient performed supine to sit with supervision and increased time for initiation with HOB slightly elevated. Provided verbal cues for initiation and reduced use of bed rails per home set-up. Caregiver reports the patient slept in a recliner PTA, but may sleep in the bed at d/c.  Transfers: Patient performed attempted sit to stand x1 with min A and strong posterior bias. Instructed patient to "re-set" and sit back down and scoot forward for improved balance and safety to come to standing. He then performed sit to/from stand with CGA-min A using the RW. Provided verbal cues for forward weight shift, hand placement, and pressing his toes into the ground to reduce posterior bias. Patient performed a simulated sedan height car transfer with min A to boost up using RW. Provided cues for safe technique including avoidance of step-in technique and holding the door for safety.  Gait Training:  Patient ambulated >100 feet x2 using RW with CGA. Ambulated with decreased gait speed, decreased step length and height,  antalgic gait on L, forward trunk lean, and downward head gaze. Provided verbal cues for erect posture, visual scanning, and step-to gait pattern leading with L to off-weight left hip with increased hip pain during ambulation.   Caregiver and patient provided information about home set-up and Caregiver confirmed via text that the patient's daughter would be present for family education and hands-on training tomorrow morning.   Patient in recliner on Roho cushion for pressure relief at end of session with breaks locked, seat belt alarm set, and all needs within reach.   Therapy Documentation Precautions:  Precautions Precautions: Fall Restrictions Weight Bearing Restrictions: No    Therapy/Group: Individual Therapy  [name] PT, DPT, NCS, CBIS  04/29/2022, 5:05 PM

## 2022-04-29 NOTE — Progress Notes (Signed)
PROGRESS NOTE   Subjective/Complaints:  Pt had restless night per nurse. Pt with right leg hanging partially off bed this morning when I came in. Confused. Says left shoulder feels better.  ROS: Limited due to cognitive/behavioral   Objective:   No results found. No results for input(s): "WBC", "HGB", "HCT", "PLT" in the last 72 hours.   No results for input(s): "NA", "K", "CL", "CO2", "GLUCOSE", "BUN", "CREATININE", "CALCIUM" in the last 72 hours.    Intake/Output Summary (Last 24 hours) at 04/29/2022 0704 Last data filed at 04/28/2022 1721 Gross per 24 hour  Intake 597 ml  Output --  Net 597 ml         Physical Exam: Vital Signs Blood pressure 118/71, pulse 86, temperature 97.6 F (36.4 C), temperature source Oral, resp. rate 18, height 6' (1.829 m), weight 79.7 kg, SpO2 98 %.   Constitutional: No distress . Vital signs reviewed. HEENT: NCAT, EOMI, oral membranes moist Neck: supple Cardiovascular: RRR without murmur. No JVD    Respiratory/Chest: CTA Bilaterally without wheezes or rales. Normal effort    GI/Abdomen: BS +, non-tender, non-distended Ext: no clubbing, cyanosis, or edema Psych: pleasant and cooperative  Neuro: A, oriented to hospital, month/year with cueing/time. Follows commands.  MSK: left shoulder more moveable, still somewhat tender with ROM however. Skin: improving bruises and lacs, hematoma right forearm. Chronic venous changes bilateral LE's. Other wounds healing/resolving.     Assessment/Plan: 1. Functional deficits which require 3+ hours per day of interdisciplinary therapy in a comprehensive inpatient rehab setting. Physiatrist is providing close team supervision and 24 hour management of active medical problems listed below. Physiatrist and rehab team continue to assess barriers to discharge/monitor patient progress toward functional and medical goals  Care Tool:  Bathing    Body  parts bathed by patient: Chest, Abdomen, Front perineal area, Right upper leg, Left upper leg, Face, Left arm, Buttocks   Body parts bathed by helper: Right arm, Right lower leg, Left lower leg     Bathing assist Assist Level: Minimal Assistance - Patient > 75%     Upper Body Dressing/Undressing Upper body dressing   What is the patient wearing?: Pull over shirt    Upper body assist Assist Level: Supervision/Verbal cueing    Lower Body Dressing/Undressing Lower body dressing      What is the patient wearing?: Pants, Incontinence brief     Lower body assist Assist for lower body dressing: Moderate Assistance - Patient 50 - 74%     Toileting Toileting    Toileting assist Assist for toileting: Contact Guard/Touching assist     Transfers Chair/bed transfer  Transfers assist  Chair/bed transfer activity did not occur: Safety/medical concerns  Chair/bed transfer assist level: Contact Guard/Touching assist Chair/bed transfer assistive device: Geologist, engineering   Ambulation assist      Assist level: Contact Guard/Touching assist Assistive device: Walker-rolling Max distance: 5   Walk 10 feet activity   Assist  Walk 10 feet activity did not occur: Safety/medical concerns (decreased activity tolerance)  Assist level: Contact Guard/Touching assist Assistive device: Walker-rolling   Walk 50 feet activity   Assist Walk 50 feet with 2 turns activity  did not occur: Safety/medical concerns  Assist level: Contact Guard/Touching assist Assistive device: Walker-rolling    Walk 150 feet activity   Assist Walk 150 feet activity did not occur: Safety/medical concerns  Assist level: Contact Guard/Touching assist Assistive device: Walker-rolling    Walk 10 feet on uneven surface  activity   Assist Walk 10 feet on uneven surfaces activity did not occur: Safety/medical concerns         Wheelchair     Assist Is the patient using a  wheelchair?: Yes Type of Wheelchair: Manual    Wheelchair assist level: Dependent - Patient 0% Max wheelchair distance: >150 ft    Wheelchair 50 feet with 2 turns activity    Assist        Assist Level: Dependent - Patient 0%   Wheelchair 150 feet activity     Assist      Assist Level: Dependent - Patient 0%   Blood pressure 118/71, pulse 86, temperature 97.6 F (36.4 C), temperature source Oral, resp. rate 18, height 6' (1.829 m), weight 79.7 kg, SpO2 98 %.  Medical Problem List and Plan: 1. Functional deficits secondary to Left temporal contusion             -patient may  shower             -ELOS/Goals: 12-14d, minA/Sup   -Continue CIR therapies including PT, OT SLP  -left hip and shoulder pain have been limiting 2.  Antithrombotics: -DVT/anticoagulation:  Mechanical: Sequential compression devices, below knee Bilateral lower extremities 11.23- Eliquis restarted after head CT- per NSU             -antiplatelet therapy: N/a 3. Pain Management: left shoulder and hip pain are chronic.    -  kpad  -scheduled tylenol  -pt refusing voltaren gel.  -Pt reports improved pain in left shoulder today after injection 12/2  -IR to inject left hip this week. Appreciate their help 4. Mood/Behavior/Sleep: LCSW to follow for evaluation and support.             --Continue melatonin with Trazodone prn for insomnia.             -antipsychotic agents:   Zyprexa QHS  -didn't do well last night, suspect it might be related to steroid.   -continue same meds tonight  5. Neuropsych/cognition: This patient is not quite capable of making decisions on his own behalf. 6. Skin/Wound Care: Routine pressure relief measures.  to multiple wounds  -left forearm wound healing, no cellulitis there 7. Fluids/Electrolytes/Nutrition: Monitor I/O.   -encourage PO -labs reasonable this morning -protein supp added for low albumin  8. CAF: Monitor HR TID-- .              --CT with improvement     -Eliquis restarted per NSU   -no bleeding sequelae 9. H/o GERD: Continue Pepcid--resume PPI. 10. HTN: Monitor BP TID--on nimotop thru 12/05. Continue Lopressor.      12/1 bp fairly controlled with lopressor 25mg  bid 11. Left moderate pleural effusion/Emphysema per X rays: Flutter valve added for pulmonary hygiene.   -encouraged IS 12. Oral secretions: Continue scopolamine patch.   13. Seizure Hx: Keppra resumed as he was on this previously 14. Hx AAA, control BP  15. Orthostatic hypotension    bp's better with reduced lopressor--no reports of orthostasis  LOS: 13 days A FACE TO FACE EVALUATION WAS PERFORMED  04/29/2022, 7:04 AM

## 2022-04-29 NOTE — Progress Notes (Signed)
Occupational Therapy TBI Note  Patient Details  Name: Eugene [name] MRN: [id number] Date of Birth: 09/29/1935  Today's Date: 04/29/2022 OT Individual Time: 5027-7412 OT Individual Time Calculation (min): 38 min    Short Term Goals: Week 1:  OT Short Term Goal 1 (Week 1): Pt will complete 2/3 steps of donning pants OT Short Term Goal 1 - Progress (Week 1): Progressing toward goal OT Short Term Goal 2 (Week 1): Pt will complete toilet transfer with LRAD at CGA level OT Short Term Goal 2 - Progress (Week 1): Met OT Short Term Goal 3 (Week 1): Pt will don shirt EOB with no A for sitting balance OT Short Term Goal 3 - Progress (Week 1): Met OT Short Term Goal 4 (Week 1): Pt will bathe with MIN A at sit to stand level OT Short Term Goal 4 - Progress (Week 1): Progressing toward goal  Skilled Therapeutic Interventions/Progress Updates:     Pt received in bed with lethargic with unrated pain in back and right shoulder. Pt with muscle rub applied to shoulder prior to session. Pt ok with OT checking to see if brief was clean, however when reporting that the brief was saturated with urine and encouraged to change. Pt yells at OT, "I will change it." Then promptly falls back asleep. Per sleep chart, pt did not sleep at night. Pt allowed ~5 min to rest then re aroused to change brief. ADL: Pt eventually agreeable to rolling in bed to change brief, and also discovered to be soiled in stool which was on sheet. Pt then more motivated to get OOB for cleansing peri area and buttocks. In standing. Pt able to power up into standing 3x throughout session with CGA. Total A to complete posterior hygiene d/t mess between scrotum/legs and don new brief in standing. Pt transfers to recliner with CGA and RW. OT threads BLE into pants and pt needs encouragement to sit to stand after OT provides rest break to change sheet. Pt with more confusion stating, "I can do this myself, go on and worry about whatever you need  to worry about." Gently reoriented pt to external aides on wall and need for assistance in hospital for mobility. Pt completes another stand to advance pants past hips bringing hands to walker first for improved recovery from posterior lean. Attempted to encourage pt to get back to bed as pt easily falling asleep but pt declines.  Pt missed 12 min skilled OT d/t  fatigue /refusal will follow up to make missed time as able per POC.   Pt left at end of session in recliner with exit alarm on, call light in reach and all needs met   Therapy Documentation Precautions:  Precautions Precautions: Fall Restrictions Weight Bearing Restrictions: No General:   Vital Signs: Therapy Vitals Temp: 97.6 F (36.4 C) Temp Source: Oral Pulse Rate: 86 Resp: 18 BP: 118/71 Patient Position (if appropriate): Lying Oxygen Therapy SpO2: 98 % O2 Device: Room Air Pain:   Agitated Behavior Scale: TBI ABS discontinued d/t ABS score less than 20 for the last three days or no behaviors present     Praxis   Exercises:   Other Treatments:     Therapy/Group: Individual Therapy  [name] 04/29/2022, 6:16 AM

## 2022-04-29 NOTE — Progress Notes (Signed)
Speech Language Pathology TBI Note  Patient Details  Name: Eugene [name] MRN: [id number] Date of Birth: [birth year]  Today's Date: 04/29/2022 SLP Individual Time: 1030-1100 SLP Individual Time Calculation (min): 30 min  Short Term Goals: Week 2: SLP Short Term Goal 1 (Week 2): Pt will tolerate regular diet with thin liquids with minimal s/sx conccerning for airway invasion and stable medical status with Mod I. SLP Short Term Goal 2 (Week 2): Pt will demonstrate orientation x 4 with Sup A. SLP Short Term Goal 3 (Week 2): Pt will demonstrate increased intellectual and emergent awareness by naming 2 deficits s/p TBI with Mod A. SLP Short Term Goal 4 (Week 2): Patient will demonstrate sustained attention to functional tasks for 30 minutes with Min verbal cues for redirection. SLP Short Term Goal 5 (Week 2): Pt will communicate wants, needs, and ideas ~90% of the time given Min A verbal cues for word-finding. SLP Short Term Goal 6 (Week 2): Patient will self-monitor and correct verbal errors with Mod multimodal cues.  Skilled Therapeutic Interventions:  Pt was seen for skilled ST targeting cognitive goals.  Upon arrival, pt was seated in recliner, awake, tired, but agreeable to participating in treatment.  Pt was oriented to place, date, and situation with mod cues for use of external aids posted in his room.  SLP facilitated the session with a novel, basic card game targeting attention to task.  Pt needed mod-max assist cues to complete task due to decreased functional problem solving but he sustained his attention to task for its duration (~20 min) with min cues for redirection.  Pt was left in recliner with chair alarm set and call bell within reach.  Continue per current plan of care.    Pain Pain Assessment Pain Scale: 0-10 Pain Score: 0-No pain  Agitated Behavior Scale: TBI Observation Details Observation Environment: cir Start of observation period - Date: 04/29/22 Start of  observation period - Time: 1030 End of observation period - Date: 04/29/22 End of observation period - Time: 1056 Agitated Behavior Scale (DO NOT LEAVE BLANKS) Short attention span, easy distractibility, inability to concentrate: Present to a slight degree Impulsive, impatient, low tolerance for pain or frustration: Present to a slight degree Uncooperative, resistant to care, demanding: Absent Violent and/or threatening violence toward people or property: Absent Explosive and/or unpredictable anger: Absent Rocking, rubbing, moaning, or other self-stimulating behavior: Absent Pulling at tubes, restraints, etc.: Absent Wandering from treatment areas: Absent Restlessness, pacing, excessive movement: Absent Repetitive behaviors, motor, and/or verbal: Absent Rapid, loud, or excessive talking: Absent Sudden changes of mood: Absent Easily initiated or excessive crying and/or laughter: Absent Self-abusiveness, physical and/or verbal: Absent Agitated behavior scale total score: 16  Therapy/Group: Individual Therapy  Demetric Parslow, Melanee Spry 04/29/2022, 10:57 AM

## 2022-04-29 NOTE — Plan of Care (Signed)
  Problem: Consults Goal: RH BRAIN INJURY PATIENT EDUCATION Description: Description: See Patient Education module for eduction specifics Outcome: Progressing   Problem: RH BOWEL ELIMINATION Goal: RH STG MANAGE BOWEL WITH ASSISTANCE Description: STG Manage Bowel with mod I Assistance. Outcome: Progressing Goal: RH STG MANAGE BOWEL W/MEDICATION W/ASSISTANCE Description: STG Manage Bowel with Medication with mod I Assistance. Outcome: Progressing   Problem: RH BLADDER ELIMINATION Goal: RH STG MANAGE BLADDER WITH ASSISTANCE Description: STG Manage Bladder With toileting Assistance Outcome: Progressing Goal: RH STG MANAGE BLADDER WITH MEDICATION WITH ASSISTANCE Description: STG Manage Bladder With Medication With mod I Assistance. Outcome: Progressing   Problem: RH SAFETY Goal: RH STG ADHERE TO SAFETY PRECAUTIONS W/ASSISTANCE/DEVICE Description: STG Adhere to Safety Precautions With cues Assistance/Device. Outcome: Progressing   Problem: RH COGNITION-NURSING Goal: RH STG USES MEMORY AIDS/STRATEGIES W/ASSIST TO PROBLEM SOLVE Description: STG Uses Memory Aids/Strategies With cues Assistance to Problem Solve. Outcome: Progressing   Problem: RH KNOWLEDGE DEFICIT BRAIN INJURY Goal: RH STG INCREASE KNOWLEDGE OF SELF CARE AFTER BRAIN INJURY Description: Family and caregivers will be able to manage care at discharge using educational handouts independently Outcome: Progressing   Problem: Education: Goal: Knowledge of disease or condition will improve Outcome: Progressing Goal: Knowledge of secondary prevention will improve (MUST DOCUMENT ALL) Outcome: Progressing Goal: Knowledge of patient specific risk factors will improve Loraine Leriche N/A or DELETE if not current risk factor) Outcome: Progressing   Problem: Ischemic Stroke/TIA Tissue Perfusion: Goal: Complications of ischemic stroke/TIA will be minimized Outcome: Progressing   Problem: Coping: Goal: Will verbalize positive feelings  about self Outcome: Progressing Goal: Will identify appropriate support needs Outcome: Progressing   Problem: Health Behavior/Discharge Planning: Goal: Ability to manage health-related needs will improve Outcome: Progressing Goal: Goals will be collaboratively established with patient/family Outcome: Progressing   Problem: Self-Care: Goal: Ability to participate in self-care as condition permits will improve Outcome: Progressing Goal: Verbalization of feelings and concerns over difficulty with self-care will improve Outcome: Progressing Goal: Ability to communicate needs accurately will improve Outcome: Progressing

## 2022-04-30 ENCOUNTER — Inpatient Hospital Stay (HOSPITAL_COMMUNITY): Payer: Medicare Other

## 2022-04-30 LAB — CBC
HCT: 38.7 % — ABNORMAL LOW (ref 39.0–52.0)
Hemoglobin: 12.8 g/dL — ABNORMAL LOW (ref 13.0–17.0)
MCH: 30.3 pg (ref 26.0–34.0)
MCHC: 33.1 g/dL (ref 30.0–36.0)
MCV: 91.5 fL (ref 80.0–100.0)
Platelets: 318 10*3/uL (ref 150–400)
RBC: 4.23 MIL/uL (ref 4.22–5.81)
RDW: 15.5 % (ref 11.5–15.5)
WBC: 10.2 10*3/uL (ref 4.0–10.5)
nRBC: 0 % (ref 0.0–0.2)

## 2022-04-30 LAB — BASIC METABOLIC PANEL
Anion gap: 13 (ref 5–15)
BUN: 43 mg/dL — ABNORMAL HIGH (ref 8–23)
CO2: 15 mmol/L — ABNORMAL LOW (ref 22–32)
Calcium: 9.8 mg/dL (ref 8.9–10.3)
Chloride: 110 mmol/L (ref 98–111)
Creatinine, Ser: 1.55 mg/dL — ABNORMAL HIGH (ref 0.61–1.24)
GFR, Estimated: 44 mL/min — ABNORMAL LOW (ref >60)
Glucose, Bld: 135 mg/dL — ABNORMAL HIGH (ref 70–99)
Potassium: 4 mmol/L (ref 3.5–5.1)
Sodium: 138 mmol/L (ref 135–145)

## 2022-04-30 MED ORDER — METHYLPREDNISOLONE ACETATE 40 MG/ML INJ SUSP (RADIOLOG
80.0000 mg | Freq: Once | INTRAMUSCULAR | Status: AC
  Administered 2022-04-30: 80 mg via INTRA_ARTICULAR

## 2022-04-30 MED ORDER — SODIUM CHLORIDE 0.45 % IV SOLN: INTRAVENOUS | Status: DC

## 2022-04-30 MED ORDER — SODIUM CHLORIDE 0.9 % IV SOLN
2.0000 g | Freq: Two times a day (BID) | INTRAVENOUS | Status: AC
  Administered 2022-04-30 – 2022-05-07 (×15): 2 g via INTRAVENOUS
  Filled 2022-04-30 (×16): qty 12.5

## 2022-04-30 MED ORDER — IOHEXOL 180 MG/ML  SOLN
5.0000 mL | Freq: Once | INTRAMUSCULAR | Status: AC | PRN
  Administered 2022-04-30: 5 mL via INTRA_ARTICULAR

## 2022-04-30 MED ORDER — BUPIVACAINE HCL (PF) 0.25 % IJ SOLN
4.0000 mL | Freq: Once | INTRAMUSCULAR | Status: AC
  Administered 2022-04-30: 4 mL via INTRA_ARTICULAR

## 2022-04-30 MED ORDER — LIDOCAINE HCL (PF) 1 % IJ SOLN
5.0000 mL | Freq: Once | INTRAMUSCULAR | Status: AC
  Administered 2022-04-30: 5 mL via INTRADERMAL
  Filled 2022-04-30: qty 30

## 2022-04-30 NOTE — Progress Notes (Signed)
Occupational Therapy Session Note  Patient Details  Name: Eugene [name] MRN: [id number] Date of Birth: Jul 17, 1935  Today's Date: 04/30/2022 OT Individual Time: 1030-1100 OT Individual Time Calculation (min): 30 min  and Today's Date: 04/30/2022 OT Missed Time: 15 Minutes Missed Time Reason: Patient fatigue   Short Term Goals: Week 2:  OT Short Term Goal 1 (Week 2): STG=LTG d/t ELOS  Skilled Therapeutic Interventions/Progress Updates:    Pt received supine fast asleep. His daughter was present for family education and still wanted to complete family education session. Family education session completed with pt's daughter [name]. Verbal education provided re fall risk reduction, energy conservation strategies, home carryover of transfer training, ADLs, and IADLs. Demonstration provided for pt performance of UB/LB bathing and dressing at CGA-min A level, toileting hygiene and transfers, and shower transfers. Discussed equipment for home use and safety precautions, including alarms. [name] expressed feeling comfortable with all provided education. Pt sleeping soundly and opted to allow him to rest ,missing final 15 min of session. MD entered to round and inform d/c will be held until later in week to allow for work up.     Therapy Documentation Precautions:  Precautions Precautions: Fall Restrictions Weight Bearing Restrictions: No   Therapy/Group: Individual Therapy  Crissie Reese 04/30/2022, 6:18 AM

## 2022-04-30 NOTE — Progress Notes (Signed)
Speech Language Pathology TBI Note  Patient Details  Name: Eugene [name] MRN: [id number] Date of Birth: 11-24-1935  Today's Date: 04/30/2022 SLP Individual Time: 1117-1203 SLP Individual Time Calculation (min): 46 min  Short Term Goals: Week 2: SLP Short Term Goal 1 (Week 2): Pt will tolerate regular diet with thin liquids with minimal s/sx conccerning for airway invasion and stable medical status with Mod I. SLP Short Term Goal 2 (Week 2): Pt will demonstrate orientation x 4 with Sup A. SLP Short Term Goal 3 (Week 2): Pt will demonstrate increased intellectual and emergent awareness by naming 2 deficits s/p TBI with Mod A. SLP Short Term Goal 4 (Week 2): Patient will demonstrate sustained attention to functional tasks for 30 minutes with Min verbal cues for redirection. SLP Short Term Goal 5 (Week 2): Pt will communicate wants, needs, and ideas ~90% of the time given Min A verbal cues for word-finding. SLP Short Term Goal 6 (Week 2): Patient will self-monitor and correct verbal errors with Mod multimodal cues.  Skilled Therapeutic Interventions: Skilled treatment session focused on family education with the patient's daughter and cognitive goals. Upon arrival, patient was asleep while upright in bed. Patient's daughter educated regarding patient's current cognitive-linguistic functioning and strategies to utilize at home to maximize attention, recall and word-finding. Patient awakened and requested to use the urinal. As patient was standing to void, it was obvious that patient had been incontinent of bowel. Patient followed 1-step commands during peri care with overall extra time and Min verbal cues. Throughout task, patient able to verbalize his wants/needs. Patient transferred to the recliner at end of session for linen change. Patient left upright in recliner with family and NT present. Continue with current plan of care.      Pain  Pain in right shoulder and hip intermittently with  movement. Patient repositioned.   Agitated Behavior Scale: TBI  ABS discontinued d/t ABS score less than 20 for the last three days or no behaviors present  Therapy/Group: Individual Therapy  [name] 04/30/2022, 1:26 PM

## 2022-04-30 NOTE — Progress Notes (Signed)
PROGRESS NOTE   Subjective/Complaints:  Pt with fair night. Slow to arouse this morning. More confusion noted yesterday with therapy yesterday. Still with left shoulder and hip pain although shoulder a bit better  ROS: Limited due to cognitive/behavioral   Objective:   No results found. Recent Labs    04/30/22 0700  WBC 10.2  HGB 12.8*  HCT 38.7*  PLT 318     Recent Labs    04/30/22 0700  NA 138  K 4.0  CL 110  CO2 15*  GLUCOSE 135*  BUN 43*  CREATININE 1.55*  CALCIUM 9.8      Intake/Output Summary (Last 24 hours) at 04/30/2022 0914 Last data filed at 04/30/2022 0720 Gross per 24 hour  Intake 592 ml  Output 275 ml  Net 317 ml         Physical Exam: Vital Signs Blood pressure 134/80, pulse 87, temperature 97.9 F (36.6 C), resp. rate 16, height 6' (1.829 m), weight 78.6 kg, SpO2 97 %.   Constitutional: No distress . Vital signs reviewed. HEENT: NCAT, EOMI, oral membranes moist Neck: supple Cardiovascular: RRR without murmur. No JVD    Respiratory/Chest: CTA Bilaterally without wheezes or rales. Normal effort    GI/Abdomen: BS +, non-tender, non-distended Ext: no clubbing, cyanosis, or edema Psych: flat, confused  Neuro: more lethargic, speech slurred. Oriented to self, hospital.  Follows commands.  MSK: left shoulder more moveable, still somewhat tender with ROM however. Left hip tender with ROM. Skin: improving bruises and lacs, hematoma right forearm. Chronic venous changes bilateral LE's. Other wounds healing/resolving.     Assessment/Plan: 1. Functional deficits which require 3+ hours per day of interdisciplinary therapy in a comprehensive inpatient rehab setting. Physiatrist is providing close team supervision and 24 hour management of active medical problems listed below. Physiatrist and rehab team continue to assess barriers to discharge/monitor patient progress toward functional and  medical goals  Care Tool:  Bathing    Body parts bathed by patient: Chest, Abdomen, Front perineal area, Right upper leg, Left upper leg, Face, Left arm, Buttocks   Body parts bathed by helper: Right arm, Right lower leg, Left lower leg     Bathing assist Assist Level: Minimal Assistance - Patient > 75%     Upper Body Dressing/Undressing Upper body dressing   What is the patient wearing?: Pull over shirt    Upper body assist Assist Level: Supervision/Verbal cueing    Lower Body Dressing/Undressing Lower body dressing      What is the patient wearing?: Pants, Incontinence brief     Lower body assist Assist for lower body dressing: Moderate Assistance - Patient 50 - 74%     Toileting Toileting    Toileting assist Assist for toileting: Contact Guard/Touching assist     Transfers Chair/bed transfer  Transfers assist  Chair/bed transfer activity did not occur: Safety/medical concerns  Chair/bed transfer assist level: Contact Guard/Touching assist Chair/bed transfer assistive device: Geologist, engineering   Ambulation assist      Assist level: Contact Guard/Touching assist Assistive device: Walker-rolling Max distance: 5   Walk 10 feet activity   Assist  Walk 10 feet activity did not occur:  Safety/medical concerns (decreased activity tolerance)  Assist level: Contact Guard/Touching assist Assistive device: Walker-rolling   Walk 50 feet activity   Assist Walk 50 feet with 2 turns activity did not occur: Safety/medical concerns  Assist level: Contact Guard/Touching assist Assistive device: Walker-rolling    Walk 150 feet activity   Assist Walk 150 feet activity did not occur: Safety/medical concerns  Assist level: Contact Guard/Touching assist Assistive device: Walker-rolling    Walk 10 feet on uneven surface  activity   Assist Walk 10 feet on uneven surfaces activity did not occur: Safety/medical concerns          Wheelchair     Assist Is the patient using a wheelchair?: Yes Type of Wheelchair: Manual    Wheelchair assist level: Dependent - Patient 0% Max wheelchair distance: >150 ft    Wheelchair 50 feet with 2 turns activity    Assist        Assist Level: Dependent - Patient 0%   Wheelchair 150 feet activity     Assist      Assist Level: Dependent - Patient 0%   Blood pressure 134/80, pulse 87, temperature 97.9 F (36.6 C), resp. rate 16, height 6' (1.829 m), weight 78.6 kg, SpO2 97 %.  Medical Problem List and Plan: 1. Functional deficits secondary to Left temporal contusion             -patient may  shower             -ELOS/Goals: 12-14d, minA/Sup   -Continue CIR therapies including PT, OT, and SLP   -left hip and shoulder pain have been limiting  -pt more lethargic today. Don't see any focal signs. BUN/Cr elevated, pt dry. This is likely the source of lethargy   -start IVF today   -check cxr (incr cough), ua, ucx   -will recheck head CT as well given that we resumed eliquis 2.  Antithrombotics: -DVT/anticoagulation:  Mechanical: Sequential compression devices, below knee Bilateral lower extremities 11.23- Eliquis restarted after head CT- per NSU             -antiplatelet therapy: N/a 3. Pain Management: left shoulder and hip pain are chronic.    -  kpad  -scheduled tylenol  -pt refusing voltaren gel.  -Some improvement in left shoulder today after injection 12/2  -IR to inject left hip this week. Appreciate their help 4. Mood/Behavior/Sleep: LCSW to follow for evaluation and support.             --Continue melatonin with Trazodone prn for insomnia.             -antipsychotic agents:   Zyprexa QHS  -sleep has been inconsistent. Steroid injection didn't help  5. Neuropsych/cognition: This patient is not quite capable of making decisions on his own behalf. 6. Skin/Wound Care: Routine pressure relief measures.  to multiple wounds  -left forearm wound  healing, no cellulitis there 7. Fluids/Electrolytes/Nutrition: Monitor I/O.   -encourage PO -labs reasonable this morning -protein supp added for low albumin  8. CAF: Monitor HR TID-- .              --CT with improvement    -Eliquis restarted per NSU   -no bleeding sequelae 9. H/o GERD: Continue Pepcid--resume PPI. 10. HTN: Monitor BP TID--on nimotop thru 12/05. Continue Lopressor.      12/4 bp fairly controlled with lopressor 25mg  bid 11. Left moderate pleural effusion/Emphysema per X rays: Flutter valve added for pulmonary hygiene.   -encouraged IS 12. Oral  secretions: Continue scopolamine patch.   13. Seizure Hx: Keppra resumed as he was on this previously 14. Hx AAA, control BP  15. Orthostatic hypotension    bp's better with reduced lopressor--   LOS: 14 days A FACE TO FACE EVALUATION WAS PERFORMED  Ranelle Oyster 04/30/2022, 9:14 AM

## 2022-04-30 NOTE — Progress Notes (Signed)
Physical Therapy Session Note  Patient Details  Name: Eugene [name] MRN: [id number] Date of Birth: 29-Jun-1935  Today's Date: 04/30/2022 PT Individual Time: 0900-1000 PT Individual Time Calculation (min): 60 min   Short Term Goals: Week 2:  PT Short Term Goal 1 (Week 2): STG=LTG due to ELOS.  Skilled Therapeutic Interventions/Progress Updates:     Patient in bed asleep with RN at bedside upon PT arrival. Per RN, patient with poor sleep, ~1 hour and ~20 min only throughout the night per report from night shift. Also presenting with weezing this morning per RN, MD already aware. Patient easily aroused and agreeable to PT session. Assisted with positioning patient and cuing patient to take 1 medication at a time to reduced aspiration risk while RN administered morning medications.   Patient cried out and reported un-rated R hip pain during ambulation, RN made aware and premedicated patient with Tylenol at beginning of session. PT provided repositioning, rest breaks, and distraction as pain interventions throughout session.   Patient reported need to void, performed supine to sit with supervision with use of hospital bed features. Patient performed an ambulatory toilet transfers with CGA using RW. Patient was continent of bladder in standing during toileting and performed peri-care and lower body clothing management independently with CGA in standing.   Patient's daughter arrived for family education. Patient fatigued on return from the bathroom and sat in the recliner during verbal family education. Patient's daughter participated in hands on training following recent CIR admission and is very well versed in patient's needs. Recommended CGA-min A using RW with all mobility with 24/7 supervision (including throughout the night). Reports patient currently has a standard walker with balls on the front, agreeable to obtaining RW. His daughter has arranged for a hospital bed and alarm mat to be placed  on the floor beside the bed and a recliner in the bedroom for his caregiver to be in the room with him. Educated on use of urinal or bedside commode for night time toileting, as opposed to ambulating to the bathroom, for safety at this time.   Educated on TBI signs symptoms and strategies for management including redirection instead of engagement, gentle orientation instead of correcting the patient, and encouraging mobility with external stimuli. Educated patient on fall risk/prevention, home modifications to prevent falls, and activation of emergency services in the event of a fall during session.   IV nurse arrived to place peripheral IV. Patient performed an ambulatory transfer back to bed with CGA using RW. Performed sit to supine with min A for lower extremity management due to fatigue. Assisted with patient's arm positioning and used therapeutic use of self to reassure patient until he fell asleep during the procedure.   Patient's daughter spoke with MD during session and with questions regarding medication management at home, differed to RN at end of session. Will provide further education as needed if patient is unable to d/c tomorrow due to medical concerns.   Patient in bed with his daughter in the room at end of session with breaks locked, bed alarm set, and all needs within reach.   Therapy Documentation Precautions:  Precautions Precautions: Fall Restrictions Weight Bearing Restrictions: No    Therapy/Group: Individual Therapy  Eugene [name] Eugene [name] PT, DPT, NCS, CBIS  04/30/2022, 12:19 PM

## 2022-04-30 NOTE — Progress Notes (Signed)
1900 - 2100 pt intermittently sleeping. At 2200 pt continuously trying to get out of bed. Pt transferred to recliner, posy belt applied with alarm on and taken to nursing station. Nursing offered pt multiple times to return to bed, but pt continued to refuse. Slept from 3 am - 4 am in recliner. Woke up after and continued to remain awake in recliner with refusal to return to bed.

## 2022-04-30 NOTE — Progress Notes (Signed)
Physical Therapy Discharge Summary  Patient Details  Name: ADRIC WREDE MRN: [id number] Date of Birth: 05/24/1936  Date of Discharge from PT service:May 10, 2022  Today's Date: 05/10/2022   Patient has met 3 of 8 long term goals due to improved activity tolerance, improved balance, improved postural control, increased strength, decreased pain, ability to compensate for deficits, improved attention, and improved awareness.  Patient to discharge at an ambulatory level  with RW for household distances with CGA for safety due to high fall risk, and wheelchair level for community access with dependent assist due to shoulder pathology .   Patient's care partner is independent to provide the necessary physical and cognitive assistance at discharge.  Reasons goals not met: Patient continues to require intermittent min A with bed mobility and transfers following slight decline in function with onset of pneumonia during patient's stay. Patient's caregivers report this level of assist will be able to be provided for the patient at d/c and are agreeable for the patient to d/c home at min A-CGA level of function with use of RW.   Recommendation:  Patient will benefit from ongoing skilled PT services in home health setting to continue to advance safe functional mobility, address ongoing impairments in balance, activity tolerance, functional mobility, gait training, safety awareness, promotion of mobility and reduced sedentary lifestyle, patient/caregiver education, and minimize fall risk.  Equipment: No equipment provided, RW and w/c provided during previous admission this year  Reasons for discharge: treatment goals met  Patient/family agrees with progress made and goals achieved: Yes  PT Discharge Precautions/Restrictions Precautions Precautions: Fall Restrictions Weight Bearing Restrictions: No Pain Interference Pain Interference Pain Effect on Sleep: 2. Occasionally Pain  Interference with Therapy Activities: 2. Occasionally Pain Interference with Day-to-Day Activities: 3. Frequently Vision/Perception  Vision - History Ability to See in Adequate Light: 0 Adequate Perception Perception: Within Functional Limits Praxis Praxis: Impaired Praxis Impairment Details: Motor planning  Cognition Overall Cognitive Status: Impaired/Different from baseline Arousal/Alertness: Awake/alert Orientation Level: Oriented to person;Oriented to time Year: 2023 Month: November Day of Week: Incorrect Attention: Sustained Sustained Attention: Impaired Sustained Attention Impairment: Verbal basic;Functional basic Memory: Impaired Memory Impairment: Storage deficit;Retrieval deficit;Decreased short term memory Decreased Short Term Memory: Verbal basic;Functional basic Awareness: Impaired Awareness Impairment: Emergent impairment Behaviors: Poor frustration tolerance Safety/Judgment: Impaired Rancho Los Amigos Scales of Cognitive Functioning: Confused, Appropriate Sensation Sensation Light Touch: Impaired Detail Peripheral sensation comments: impaired B LE's distal to the knee (chronic per pt) Light Touch Impaired Details: Impaired RLE;Impaired LLE Hot/Cold: Not tested Proprioception: Appears Intact Stereognosis: Not tested Coordination Gross Motor Movements are Fluid and Coordinated: No Fine Motor Movements are Fluid and Coordinated: No Coordination and Movement Description: remains deconditioned and limited by pain, motor planning with mobilty improved since eval--but remains impaired Motor  Motor Motor: Abnormal postural alignment and control;Motor apraxia  Mobility Bed Mobility Rolling Right: Supervision/verbal cueing Rolling Left: Supervision/Verbal cueing Supine to Sit: Minimal Assistance - Patient > 75%;Supervision/Verbal cueing Sit to Supine: Supervision/Verbal cueing Transfers Sit to Stand: Contact Guard/Touching assist Stand to Sit: Contact  Guard/Touching assist Stand Pivot Transfers: Contact Guard/Touching assist Stand Pivot Transfer Details: Verbal cues for safe use of DME/AE;Manual facilitation for weight shifting;Verbal cues for sequencing Transfer (Assistive device): Rolling walker Locomotion  Gait Ambulation: Yes Gait Assistance: Contact Guard/Touching assist Gait Distance (Feet): 250 Feet Assistive device: Rolling walker Gait Assistance Details: Verbal cues for precautions/safety;Verbal cues for safe use of DME/AE;Verbal cues for technique;Verbal cues for gait pattern Gait Gait: Yes Gait Pattern: Decreased stride  length;Decreased hip/knee flexion - right;Decreased hip/knee flexion - left;Decreased weight shift to right;Decreased weight shift to left;Left foot flat;Right foot flat;Left flexed knee in stance;Right flexed knee in stance;Trunk flexed;Decreased trunk rotation;Antalgic Gait velocity: decreased Stairs / Additional Locomotion Stairs: No Architect: Yes Wheelchair Assistance: Dependent - Patient 0% Wheelchair Propulsion: Other (comment) (limited by shoulder pain/pathology) Wheelchair Parts Management: Needs assistance  Trunk/Postural Assessment  Cervical Assessment Cervical Assessment: Exceptions to [facility] Thoracic Assessment Thoracic Assessment: Exceptions to [facility] Lumbar Assessment Lumbar Assessment: Exceptions to [facility] Postural Control Postural Control: Deficits on evaluation (posterior bias)  Balance Balance Balance Assessed: Yes Standardized Balance Assessment Standardized Balance Assessment: Berg Balance Test Berg Balance Test Sit to Stand: Needs minimal aid to stand or to stabilize Standing Unsupported: Needs several tries to stand 30 seconds unsupported Sitting with Back Unsupported but Feet Supported on Floor or Stool: Able to sit safely and securely 2 minutes Stand to Sit: Sits independently, has uncontrolled descent Transfers: Needs one person to assist Standing  Unsupported with Eyes Closed: Needs help to keep from falling Standing Ubsupported with Feet Together: Needs help to attain position and unable to hold for 15 seconds From Standing, Reach Forward with Outstretched Arm: Loses balance while trying/requires external support From Standing Position, Pick up Object from Floor: Unable to try/needs assist to keep balance From Standing Position, Turn to Look Behind Over each Shoulder: Needs assist to keep from losing balance and falling Turn 360 Degrees: Needs assistance while turning Standing Unsupported, Alternately Place Feet on Step/Stool: Needs assistance to keep from falling or unable to try Standing Unsupported, One Foot in Front: Loses balance while stepping or standing Standing on One Leg: Unable to try or needs assist to prevent fall Total Score: 8 Static Sitting Balance Static Sitting - Balance Support: Left upper extremity supported;Right upper extremity supported;Feet supported Static Sitting - Level of Assistance: 5: Stand by assistance Dynamic Sitting Balance Dynamic Sitting - Balance Support: Right upper extremity supported;Left upper extremity supported Dynamic Sitting - Level of Assistance: 4: Min assist;5: Stand by assistance Static Standing Balance Static Standing - Balance Support: Bilateral upper extremity supported;During functional activity Static Standing - Level of Assistance: 5: Stand by assistance Dynamic Standing Balance Dynamic Standing - Balance Support: Bilateral upper extremity supported;During functional activity Dynamic Standing - Level of Assistance: 5: Stand by assistance;4: Min assist Extremity Assessment  RLE Assessment RLE Assessment: Exceptions to [facility] Active Range of Motion (AROM) Comments: tight hamstring and heel cord General Strength Comments: Grossly at least 3+ to 4/5 with functional mobility, limited formal testing due to cognitive deficits LLE Assessment LLE Assessment: Exceptions to [facility] Active  Range of Motion (AROM) Comments: tight hamstring and heel cord General Strength Comments: Grossly at least 3+ to 4/5 with functional mobility, limited formal testing due to cognitive deficits   Ailsa Mireles L Kinte Trim PT, DPT, NCS, CBIS  05/10/2022, 12:43 PM

## 2022-04-30 NOTE — Progress Notes (Signed)
Pharmacy Antibiotic Note  Eugene [name] is a 86 y.o. male admitted on 04/16/2022 with pneumonia.  Pharmacy has been consulted for cefepime dosing.  Plan: Cefepime 2gm IV q12h Monitor renal function and clinical course.    Height: 6' (182.9 cm) Weight: 78.6 kg (173 lb 4.5 oz) IBW/kg (Calculated) : 77.6  Temp (24hrs), Avg:97.8 F (36.6 C), Min:97.5 F (36.4 C), Max:97.9 F (36.6 C)  Recent Labs  Lab 04/30/22 0700  WBC 10.2  CREATININE 1.55*    Estimated Creatinine Clearance: 38.9 mL/min (A) (by C-G formula based on SCr of 1.55 mg/dL (H)).    Allergies  Allergen Reactions   Chlorhexidine Gluconate Itching    Don't use Chloraprep inside port a cath kits.   Macrolides And Ketolides Nausea And Vomiting   Tape Other (See Comments)    Adhesive tape,   Only use paper tape    Antimicrobials this admission: 12/4 cefepime >>   Dose adjustments this admission: N/a  Microbiology results: N/a  Makalyn Lennox A. Jeanella Craze, PharmD, BCPS, FNKF Clinical Pharmacist [place] Please utilize Amion for appropriate phone number to reach the unit pharmacist [facility])  04/30/2022 3:24 PM

## 2022-05-01 DIAGNOSIS — J189 Pneumonia, unspecified organism: Secondary | ICD-10-CM

## 2022-05-01 DIAGNOSIS — G4720 Circadian rhythm sleep disorder, unspecified type: Secondary | ICD-10-CM | POA: Insufficient documentation

## 2022-05-01 DIAGNOSIS — Y95 Nosocomial condition: Secondary | ICD-10-CM

## 2022-05-01 DIAGNOSIS — F05 Delirium due to known physiological condition: Secondary | ICD-10-CM

## 2022-05-01 DIAGNOSIS — M1612 Unilateral primary osteoarthritis, left hip: Secondary | ICD-10-CM

## 2022-05-01 DIAGNOSIS — T50905A Adverse effect of unspecified drugs, medicaments and biological substances, initial encounter: Secondary | ICD-10-CM | POA: Insufficient documentation

## 2022-05-01 LAB — CBC
HCT: 39.5 % (ref 39.0–52.0)
Hemoglobin: 13.7 g/dL (ref 13.0–17.0)
MCH: 30.9 pg (ref 26.0–34.0)
MCHC: 34.7 g/dL (ref 30.0–36.0)
MCV: 89 fL (ref 80.0–100.0)
Platelets: 315 10*3/uL (ref 150–400)
RBC: 4.44 MIL/uL (ref 4.22–5.81)
RDW: 15.3 % (ref 11.5–15.5)
WBC: 8.3 10*3/uL (ref 4.0–10.5)
nRBC: 0 % (ref 0.0–0.2)

## 2022-05-01 LAB — BASIC METABOLIC PANEL
Anion gap: 10 (ref 5–15)
BUN: 43 mg/dL — ABNORMAL HIGH (ref 8–23)
CO2: 15 mmol/L — ABNORMAL LOW (ref 22–32)
Calcium: 9.7 mg/dL (ref 8.9–10.3)
Chloride: 112 mmol/L — ABNORMAL HIGH (ref 98–111)
Creatinine, Ser: 1.36 mg/dL — ABNORMAL HIGH (ref 0.61–1.24)
GFR, Estimated: 51 mL/min — ABNORMAL LOW (ref >60)
Glucose, Bld: 141 mg/dL — ABNORMAL HIGH (ref 70–99)
Potassium: 3.9 mmol/L (ref 3.5–5.1)
Sodium: 137 mmol/L (ref 135–145)

## 2022-05-01 MED ORDER — ACETAMINOPHEN 325 MG PO TABS: 650.0000 mg | ORAL_TABLET | Freq: Four times a day (QID) | ORAL | Status: AC

## 2022-05-01 MED ORDER — DM-GUAIFENESIN ER 30-600 MG PO TB12
1.0000 | ORAL_TABLET | Freq: Two times a day (BID) | ORAL | Status: DC
  Administered 2022-05-01 – 2022-05-07 (×11): 1 via ORAL
  Filled 2022-05-01 (×13): qty 1

## 2022-05-01 NOTE — Progress Notes (Addendum)
Speech Language Pathology Daily Session Note  Patient Details  Name: Eugene [name] MRN: [id number] Date of Birth: Mar 09, 1936  Today's Date: 05/01/2022 SLP Individual Time: 0930-1000 SLP Individual Time Calculation (min): 30 min  Short Term Goals: Week 2: SLP Short Term Goal 1 (Week 2): Pt will tolerate regular diet with thin liquids with minimal s/sx conccerning for airway invasion and stable medical status with Mod I. SLP Short Term Goal 2 (Week 2): Pt will demonstrate orientation x 4 with Sup A. SLP Short Term Goal 3 (Week 2): Pt will demonstrate increased intellectual and emergent awareness by naming 2 deficits s/p TBI with Mod A. SLP Short Term Goal 4 (Week 2): Patient will demonstrate sustained attention to functional tasks for 30 minutes with Min verbal cues for redirection. SLP Short Term Goal 5 (Week 2): Pt will communicate wants, needs, and ideas ~90% of the time given Min A verbal cues for word-finding. SLP Short Term Goal 6 (Week 2): Patient will self-monitor and correct verbal errors with Mod multimodal cues.  Skilled Therapeutic Interventions: Skilled treatment session focused on dysphagia and communication goals. OT and nursing report large aspiration event today while taking medications this morning. Patient was lethargic but awake in bed upon arrival. Patient repositioned to maximize safety with PO intake. Patient consumed trials of ice chips, thin liquids via tsp, and 5cc/10cc of thin liquids via a medication cup. Patient without overt s/s of aspiration and only subtle throat clearing noted X 2. Mod verbal cues were needed for use of multiple swallows. Patient with a baseline dysphagia which does not appear to have significantly changed since this admission. However, patient is at a greater risk of aspiration due to fatigue (hasn't slept in last 3 nights) and poor positioning in bed. Recommend patient continue regular textures with thin liquids via the 10cc provale cup with full  supervision to maximize use of swallowing compensatory strategies of multiple swallows and a slow rate. Also recommend meds are given whole in puree (small) or crushed in puree (large).  Patient verbalized understanding of all recommendations but will need reinforcement. Also spoke to the patient's son regarding recommendations. An objective swallow assessment may be warranted if overt s/s of aspiration persist. Patient left upright in bed with alarm on and all needs within reach. Continue with current plan of care.   Of note, spoke to patient's daughter on the phone regarding current swallowing function and changes in recommendations. Will proceed with an object swallow assessment tomorrow to assess current swallow function and appropriate recommendations prior to discharge.      Pain No/Denies Pain   Therapy/Group: Individual Therapy  Zandria Woldt 05/01/2022, 10:29 AM

## 2022-05-01 NOTE — Plan of Care (Signed)
Patient continues to have episodes of incontinence, mostly when confused.

## 2022-05-01 NOTE — Progress Notes (Addendum)
Patient ID: Eugene [name], male   DOB: 1935/09/20, 86 y.o.   MRN: [id number]  SW informed pt will not discharge today due to medical reasons. Likely for pt to leave by end of the week pending medical stability.   1147- SW left message for pt dtr [name] to inform on above with regard to d/c possibly by end of the week if he is medically ready. SW encouraged follow-up.   1214-Sw returned phone call to pt dtr [name], and SW shared will speak with medical staff to give updates on chest x-ray. She is asking for atleast 24hr notice on pt discharge so she can line up his caregivers. Confirms hospital bed delivered. No other questions/concerns reported.   SW updated [facility] in his delay in discharge.   Cecile Sheerer, MSW, LCSWA Office: [phone] Cell: [phone] Fax: [phone]

## 2022-05-01 NOTE — Discharge Instructions (Addendum)
Inpatient Rehab Discharge Instructions  ISSAAC SHIPPER Discharge date and time:  05/10/22  Activities/Precautions/ Functional Status: Activity: no lifting, driving, or strenuous exercise till cleared by MD Diet: Soft foods. Need to thicken liquids to nectar thick consistency.  Wound Care: Cover skin tears with dry dressing for padding. Use Desitin or Vitamin A and D ointment to sacrum couple of times a day and after incontinent episodes to protect sacrum.    Functional status:  ___ No restrictions     ___ Walk up steps independently _X__ 24/7 supervision/assistance   ___ Walk up steps with assistance ___ Intermittent supervision/assistance  ___ Bathe/dress independently ___ Walk with walker     _X__ Bathe/dress with assistance ___ Walk Independently    ___ Shower independently ___ Walk with assistance    ___ Shower with assistance _X__ No alcohol     ___ Return to work/school ________  Special Instructions:   COMMUNITY REFERRALS UPON DISCHARGE:    Home Health:   PT      OT      ST                     Agency: [facility]  Phone:  [phone] *Services will resume with this agency. Please expect follow-up within 2-3 days to schedule your home visit. If you have not received any follow-up, be sure to contact with the branch directly.*    My questions have been answered and I understand these instructions. I will adhere to these goals and the provided educational materials after my discharge from the hospital.  Patient/Caregiver Signature _______________________________ Date __________  Clinician Signature _______________________________________ Date __________  Please bring this form and your medication list with you to all your follow-up doctor's appointments.

## 2022-05-01 NOTE — Progress Notes (Signed)
Occupational Therapy TBI Note  Patient Details  Name: Eugene [name] MRN: [id number] Date of Birth: Dec 19, 1935  Today's Date: 05/01/2022 OT Individual Time: 1035-1100 OT Individual Time Calculation (min): 25 min    Short Term Goals: Week 2:  OT Short Term Goal 1 (Week 2): STG=LTG d/t ELOS  Skilled Therapeutic Interventions/Progress Updates:  Skilled OT intervention completed with focus on AROM of BUE especially LUE post steroid injection. Pt received upright in bed, with nursing present administering meds due to pt compliance and request to let pt finish taking due to earlier refusal to take AM meds. Unrated discomforted reported in L shoulder with certain degrees of movement however tolerated well- pre-medicated. Offered distractive techniques and multimodal cues for pain reduction.  Son present during session who assisted with pt participation.   Initially pt was resistant to PROM on LUE with more guarding to prevent pain suspected than being non-compliant. On eval, pt was only able tolerate about 30 degrees of shoulder flexion (AROM). When asked to lift his arm, pt was able to demo about 90 degrees of flexion AROM. With AAROM, and use of distractive techniques and external cues, pt was able to tolerate about 110-120 degrees AAROM shoulder flexion. Elbow/wrist/hand noted to be [facility].  Initiated AROM exercises on RUE first with use of external cues and visual target of tapping therapist's hand for follow through with completion of the following: x10 shoulder flexion, chest press, tricep extension with shoulder at 90, then transitioned to LUE with pt tolerating x10 shoulder flexion to about 110 each time though pain and fatigue increasing with reps.  Pt remained upright in bed, with bed alarm on/activated, and with all needs in reach at end of session.   Therapy Documentation Precautions:  Precautions Precautions: Fall Restrictions Weight Bearing Restrictions: No   Therapy/Group:  Individual Therapy  Melvyn Novas, MS, OTR/L  05/01/2022, 12:04 PM

## 2022-05-01 NOTE — Progress Notes (Signed)
Physical Therapy Note  Patient Details  Name: BRODRICK CURRAN MRN: [id number] Date of Birth: 26-Jun-1935 Today's Date: 05/01/2022  Therapist attempted to see patient this afternoon for missed therapy minutes, however upon arrival patient was sleeping soundly and difficult to arouse. Patient denied participation with PT at this time secondary to lethargy. PT unable to makeup missed minutes at this time.    Abie Cheek 05/01/2022, 3:16 PM

## 2022-05-01 NOTE — Patient Care Conference (Signed)
Inpatient RehabilitationTeam Conference and Plan of Care Update Date: 05/01/2022   Time: 10:16 AM    Patient Name: Eugene [name]      Medical Record Number: [id number]  Date of Birth: [birth year] Sex: Male         Room/Bed: 4M03C/4M03C-01 Payor Info: Payor: MEDICARE / Plan: MEDICARE PART A AND B / Product Type: *No Product type* /    Admit Date/Time:  04/16/2022  4:41 PM  Primary Diagnosis:  TBI (traumatic brain injury) [facility])  Hospital Problems: Principal Problem:   TBI (traumatic brain injury) [facility])    Expected Discharge Date: Expected Discharge Date:  (d/c date on medical hold)  Team Members Present: Physician leading conference: Dr. Faith Rogue Social Worker Present: Cecile Sheerer, LCSWA Nurse Present: Chana Bode, RN PT Present: Serina Cowper, PT OT Present: Blanch Media, OT SLP Present: Feliberto Gottron, SLP PPS Coordinator present : Fae Pippin, SLP     Current Status/Progress Goal Weekly Team Focus  Bowel/Bladder   Continent of B/B with some bladder incontinence.   Remain full continence   Assist with toileting as needed    Swallow/Nutrition/ Hydration   Regular textures with thin liquids, intermittent supervision   Supervision  tolerance of current diet, increased use of swallowing compensatory strategies    ADL's   CGA-min A mobilty, continues to decline participation intermittently, new dx of pheumonia impacting arousal, reversed sleep wake cycle, poor tolerance to challenge,   CGA for mobilty/UB ADLs, Min A bathing   standing balance, participation/motivation, endurance, cognition    Mobility   CGA-min A overall   CGA overall  Activity tolerance, balance, functional mobility, gait training, patient/caregiver education as needed    Communication   Min-Mod A   Min A   Family Education    Safety/Cognition/ Behavioral Observations  Min-Mod A   Min A   Family Education    Pain   Pain to shoulder and hip, Scheduled  medications.   Pain<3/10   Monitor for effectiveness    Skin   Impaired skin to sacrum   Skin will be free from infection with no further breakdown  Assess Qshift and prn      Discharge Planning:  Pt to d/c to home with 24/7 hired caregivers and PRN support from pt children. Has [facility] for [facility]. Fam edu completed on 12/4 9am-12pm with pt children. SW will confirm no barriers to discharge.   Team Discussion: Patient with increased confusion, insomnia post steroid injections, CT scan completed post Eliquis restart noted resolving hemorrhage. History of questionable seizure like activity; keppra prescribed long term. CXR; LLL pna with hx of pleural effusion and emphysema.  Progress impaired by fatigue; left lat lean and phonemic paraphasias,  poor swallowing prior to new onset of coughing and swallowing issues; new interventions added including meds whole with applesauce/crush large pills. Give meds with 10 cc bolus in a med cup or use provale' cup for liquids to moderate the bolus amount. Labs elevated; dehydration noted = IVF order. D/C on hold, continue to monitor for a few days post change of interventions.  Patient on target to meet rehab goals: Currently needs min - CGA for ambulation with a RW. Changed to stedy for transfers given posterior lean and fatigue.   *See Care Plan and progress notes for long and short-term goals.   Revisions to Treatment Plan:  SLP re-eval   Teaching Needs: Safety, dietary modifications, medications, swallowing interventions, transfers, toileting, etc.   Current Barriers to Discharge: Decreased  caregiver support and home environment  Possible Resolutions to Barriers: Family education completed 04/30/22 DME: Hospital bed, [facility], alarm mat, RW HH follow up services 24/7 caregivers recommended due to fall history     Medical Summary Current Status: more confusion, LLL pneumonia, ?aspiration. prerenal azotemia--on ivf, s/p  steroid injection x 2  Barriers to Discharge: Medical stability;Behavior/Mood   Possible Resolutions to [employer]: IV maxipime, ivf, restore sleep-wake. pain rx   Continued Need for Acute Rehabilitation Level of Care: The patient requires daily medical management by a physician with specialized training in physical medicine and rehabilitation for the following reasons: Direction of a multidisciplinary physical rehabilitation program to maximize functional independence : Yes Medical management of patient stability for increased activity during participation in an intensive rehabilitation regime.: Yes Analysis of laboratory values and/or radiology reports with any subsequent need for medication adjustment and/or medical intervention. : Yes   I attest that I was present, lead the team conference, and concur with the assessment and plan of the team.   Chana Bode B 05/01/2022, 10:17 AM

## 2022-05-01 NOTE — Progress Notes (Signed)
Physical Therapy Note  Patient Details  Name: Eugene [name] MRN: [id number] Date of Birth: November 07, 1935 Today's Date: 05/01/2022    1100: Patient in bed asleep upon PT arrival. Patient slow to arouse to verbal and tactile stimulation. Patient declined skilled PT interventions, mobility, toileting, bed level exercise/ROM, due to fatigue. Per RN, patient did not sleep well again last night and did participate in therapy and a visit with his son this morning. Patient missed 60 min of skilled PT due to fatigue, RN made aware. Will attempt to make-up missed time as able.     Jayd Forrey L Elmyra Banwart PT, DPT, NCS, CBIS  05/01/2022, 3:49 PM

## 2022-05-01 NOTE — Discharge Summary (Signed)
Physician Discharge Summary  Patient ID: Eugene [name] MRN: [id number] DOB/AGE: [birth year] 86 y.o.  Admit date: 04/16/2022 Discharge date: 05/10/2022  Discharge Diagnoses:  Principal Problem:   TBI (traumatic brain injury) [facility]) Active Problems:   Shoulder pain   Acute kidney injury superimposed on chronic kidney disease (HCC)   Mood disorder (HCC)   Malnutrition of moderate degree   Subdural hematoma (HCC)   Sleep-wake disorder   Delirium, drug-induced   Discharged Condition: stable  Significant Diagnostic Studies: DG CHEST PORT 1 VIEW  Result Date: 05/08/2022 CLINICAL DATA:  Cough EXAM: PORTABLE CHEST 1 VIEW COMPARISON:  05/03/22 CXR FINDINGS: Redemonstrated left-sided pleural effusion. No pneumothorax. No new focal airspace opacity. No displaced rib fractures. Unchanged cardiac contours. Redemonstrated extensive calcifications of the thoracic aorta. IMPRESSION: Redemonstrated left-sided pleural effusion. No new focal airspace opacity. Electronically Signed   By: Lorenza Cambridge M.D.   On: 05/08/2022 13:33   DG Chest 2 View  Result Date: 05/03/2022 CLINICAL DATA:  Pneumonia. EXAM: CHEST - 2 VIEW COMPARISON:  Chest two views 04/30/2022, 04/11/2022, 03/20/2022, 01/19/2022; AP chest 08/25/2021; CT chest 11/06/2005. FINDINGS: Cardiac silhouette is mildly enlarged. Mediastinal contours are within normal limits. Moderate to high-grade atherosclerotic calcifications within the thoracic aorta. There is a persistent retrocardiac opacity and blunting of the costophrenic angle, suggesting left lower lobe airspace opacification and a small left pleural effusion. And note is made that left lower lung airspace opacity and at least small pleural effusion have not seen on all prior comparison studies including 04/30/2022, 04/11/2022, 02/28/2022, and 01/19/2022. The left lung base appears clear on 08/25/2021 frontal radiograph. No pneumothorax.  No acute skeletal abnormality. IMPRESSION: Persistent  left lower lobe airspace opacification and at least small left pleural effusion. Findings are concerning for left lower lobe pneumonia and small pleural effusion. Recommend clinical correlation. Note is made that there has been a left lower lobe airspace opacification and a left pleural effusion on all the comparison radiographs from December, November, and October 2023. Electronically Signed   By: Neita Garnet M.D.   On: 05/03/2022 08:34   DG Swallowing Func-Speech Pathology  Result Date: 05/02/2022 Table formatting from the original result was not included. Objective Swallowing Evaluation: Type of Study: MBS-Modified Barium Swallow Study  Patient Details Name: Eugene [name] MRN: [id number] Date of Birth: 05-10-1936 Today's Date: 05/02/2022 Time: SLP Start Time (ACUTE ONLY): 1351 -SLP Stop Time (ACUTE ONLY): 1407 SLP Time Calculation (min) (ACUTE ONLY): 16 min Past Medical History: Past Medical History: Diagnosis Date  AAA (abdominal aortic aneurysm) (HCC) 06/29/2007  stent graft  Aortic stenosis 09/05/2017  Mild AS/AR by echo 01/2017.  GERD (gastroesophageal reflux disease)   Hemochromatosis   Hyperlipidemia   Hypertension   Irritable bowel syndrome   Permanent atrial fibrillation (HCC)   Seizure (HCC)   Stroke (HCC)   Transient Ischemic Attack Past Surgical History: Past Surgical History: Procedure Laterality Date  ABDOMINAL AORTIC ANEURYSM REPAIR    ENDOVASCULAR STENT INSERTION  07/18/2007  saccular infrarenal aortic aneurysm  EYE SURGERY Bilateral   Cataract    IR FLUORO GUIDE CV LINE LEFT  12/08/2018  IR FLUORO GUIDE CV LINE RIGHT  02/13/2017  IR FLUORO GUIDE CV LINE RIGHT  02/25/2017  IR US GUIDE VASC ACCESS RIGHT  02/13/2017  PICC LINE PLACE PERIPHERAL ([facility] HX)  Oct 21, 2014 HPI: Patient is an 86 y.o. male with PMH: chronic a-fib, seizure disorder, HTN, HLD, chronic ambulation dysfunction with frequent falls, AAA s/p repair, recent UTI, CVA. He was  admitted in August of 2023 following fall onto side of  bathtub and ultimately was evaluated and treated by SLP for dysphagia (appeared to be acute on chronic). CT Head showed 1.8 cm hemorrhagic contusion of left temporal lobe as well as overlying SDH and SAH, mild mass effect on the underlying brain parenchyma but no midline shift. He failed Yale swallow with RN and was made NPO. BSE completed on 04/10/22 and pt placed on a Dysphagia 2/thin diet and downgraded on 04/11/22 to a full liquid diet; progressed back to Regular/thin liquids on 04/13/22 per ST recommendation. ST f/u for dysphagia tx/education.  Subjective: awake, alert, no family present  Recommendations for follow up therapy are one component of a multi-disciplinary discharge planning process, led by the attending physician.  Recommendations may be updated based on patient status, additional functional criteria and insurance authorization. Assessment / Plan / Recommendation   05/02/2022  11:56 AM Clinical Impressions Clinical Impression MBSS completed secondary to concerns for LLL PNA in the setting of hx of dysphagia and fatigue. This date, pt presents with moderate multi-phase dysphagia c/b decreased oral control, incomplete A-P transit, poor posterior bolus control, decreased BOT retraction, diminished hyolaryngeal excursion, diminished pharyngeal peristalsis, and decreased laryngeal elevation + closure. Swallow appears further complicated by curvature of epiglottis that appears rather stiff and with decreased ROM and speed of movement and fatigue.  Aforementioned findings resulted in 2 instances of silent penetration and aspiration with thin liquid despite bolus size (small and large). One instance of cord-level penetration with nectar-thick liquid via tsp that was sensed and ejected. Suspect this is secondary to fatigue vs physiological deficits given findings appear consistent with August 2023 report. No penetration nor aspiration of thin liquid with use of chin tuck; however, pt unable to consistently  and successfully implement without heavy cueing this date. No penetration nor aspiration with honey-thick liquid and pureed textures; nevertheless, pt exhibits moderate to severe vallecular space with nectar to pureed textures that only minimally cleared with multiple swallows. Mild to trace along the posterior pharyngeal wall, within pyriform sinuses, and along lateral channels. No retrograde backflow noted on today's swallow study, in comparison to MBSS completed in August 2023. Did note a bubble at the level of the lower 1/3 cervical esophagus.  Given instrumental observations, recommend Dysphagia 3 and nectar-thick liquids via cup or straw given strict adherence to aspiration and reflux precautions to include small + single bites/sips, slow rate, and multiple swallows per bolus. May consider OP f/u MBSS in 3-4 weeks to assess readiness for diet upgrade and recommendations for OP or HH ST f/u to address swallowing and cognitive deficits. May consider initiation of [facility] Protocol with chin tuck, as well as OP GI f/u if pt agrees.  SLP Visit Diagnosis Dysphagia, oropharyngeal phase (R13.12);Cognitive communication deficit (R41.841) Impact on safety and function Moderate aspiration risk;Mild aspiration risk     04/10/2022  10:56 AM Treatment Recommendations Treatment Recommendations Therapy as outlined in treatment plan below     05/02/2022  11:58 AM Prognosis Prognosis for Safe Diet Advancement Good Barriers to Reach Goals Cognitive deficits;Time post onset;Motivation   05/02/2022  11:56 AM Diet Recommendations SLP Diet Recommendations Dysphagia 3 (Mech soft) solids;Nectar thick liquid Liquid Administration via Cup;Straw Medication Administration Crushed with puree Compensations Small sips/bites;Slow rate;Multiple dry swallows after each bite/sip;Follow solids with liquid;Minimize environmental distractions;Clear throat intermittently Postural Changes Remain semi-upright after after feeds/meals  (Comment);Seated upright at 90 degrees     05/02/2022  11:56 AM Other Recommendations Recommended  Consults Consider GI evaluation Oral Care Recommendations Oral care BID   04/10/2022  10:56 AM Frequency and Duration  Speech Therapy Frequency (ACUTE ONLY) min 2x/week Treatment Duration 2 weeks     05/02/2022  11:38 AM Oral Phase Oral Phase Impaired Oral - Honey Teaspoon Reduced posterior propulsion;Weak lingual manipulation;Delayed oral transit Oral - Honey Cup Delayed oral transit;Reduced posterior propulsion;Weak lingual manipulation Oral - Nectar Teaspoon Weak lingual manipulation;Reduced posterior propulsion;Delayed oral transit;Decreased bolus cohesion;Premature spillage Oral - Nectar Cup Weak lingual manipulation;Reduced posterior propulsion;Delayed oral transit;Decreased bolus cohesion;Premature spillage Oral - Nectar Straw Delayed oral transit;Decreased bolus cohesion;Premature spillage;Reduced posterior propulsion Oral - Thin Teaspoon Delayed oral transit;Decreased bolus cohesion;Premature spillage;Reduced posterior propulsion;Weak lingual manipulation Oral - Thin Cup Weak lingual manipulation;Reduced posterior propulsion;Premature spillage;Decreased bolus cohesion;Delayed oral transit Oral - Thin Straw NT Oral - Puree Delayed oral transit;Reduced posterior propulsion;Weak lingual manipulation Oral - Mech Soft NT Oral - Regular NT Oral - Multi-Consistency NT Oral - Pill NT    05/02/2022  11:50 AM Pharyngeal Phase Pharyngeal Phase Impaired Pharyngeal- Honey Teaspoon Delayed swallow initiation-vallecula;Reduced pharyngeal peristalsis;Reduced epiglottic inversion;Reduced anterior laryngeal mobility;Reduced tongue base retraction;Pharyngeal residue - valleculae;Lateral channel residue;Pharyngeal residue - posterior pharnyx Pharyngeal Material does not enter airway Pharyngeal- Honey Cup Delayed swallow initiation-vallecula;Reduced pharyngeal peristalsis;Reduced epiglottic inversion;Reduced anterior laryngeal  mobility;Reduced tongue base retraction;Pharyngeal residue - valleculae;Lateral channel residue;Pharyngeal residue - posterior pharnyx Pharyngeal Material does not enter airway Pharyngeal- Nectar Teaspoon Delayed swallow initiation-vallecula;Reduced pharyngeal peristalsis;Reduced epiglottic inversion;Reduced laryngeal elevation;Reduced anterior laryngeal mobility;Reduced airway/laryngeal closure;Reduced tongue base retraction;Penetration/Aspiration during swallow;Pharyngeal residue - valleculae;Pharyngeal residue - pyriform;Pharyngeal residue - posterior pharnyx;Lateral channel residue Pharyngeal Material enters airway, CONTACTS cords and then ejected out Pharyngeal- Nectar Cup Delayed swallow initiation-vallecula;Reduced pharyngeal peristalsis;Reduced epiglottic inversion;Reduced tongue base retraction;Pharyngeal residue - valleculae;Lateral channel residue;Pharyngeal residue - posterior pharnyx;Pharyngeal residue - pyriform Pharyngeal Material does not enter airway Pharyngeal- Nectar Straw Delayed swallow initiation-vallecula;Reduced pharyngeal peristalsis;Reduced epiglottic inversion;Reduced tongue base retraction;Pharyngeal residue - valleculae;Pharyngeal residue - pyriform;Pharyngeal residue - posterior pharnyx;Lateral channel residue Pharyngeal Material does not enter airway Pharyngeal- Thin Teaspoon Delayed swallow initiation-vallecula;Reduced pharyngeal peristalsis;Reduced epiglottic inversion;Reduced tongue base retraction;Pharyngeal residue - valleculae;Reduced laryngeal elevation;Reduced anterior laryngeal mobility;Pharyngeal residue - posterior pharnyx;Penetration/Aspiration during swallow Pharyngeal Material enters airway, passes BELOW cords without attempt by patient to eject out (silent aspiration) Pharyngeal- Thin Cup Delayed swallow initiation-vallecula;Reduced pharyngeal peristalsis;Reduced epiglottic inversion;Reduced airway/laryngeal closure;Reduced tongue base retraction;Penetration/Aspiration  during swallow;Pharyngeal residue - valleculae;Pharyngeal residue - pyriform;Pharyngeal residue - posterior pharnyx;Lateral channel residue Pharyngeal Material enters airway, passes BELOW cords without attempt by patient to eject out (silent aspiration) Pharyngeal- Thin Straw NT Pharyngeal- Puree Delayed swallow initiation-vallecula;Reduced pharyngeal peristalsis;Reduced epiglottic inversion;Reduced tongue base retraction;Pharyngeal residue - valleculae Pharyngeal Material does not enter airway Pharyngeal- Mechanical Soft NT Pharyngeal- Regular NT Pharyngeal- Multi-consistency NT Pharyngeal- Pill NT    05/02/2022  11:49 AM Cervical Esophageal Phase  Cervical Esophageal Phase Impaired Cervical Esophageal Comment Pt with air sac/bubble observed with bolus transit  through the lower 1/3 of the cervical esophagus. Slightly decreased USE opening. Bethany A Lutes 05/02/2022, 12:45 PM                     CT HEAD WO CONTRAST ( )  Result Date: 04/30/2022 CLINICAL DATA:  Mental status changes of unknown cause EXAM: CT HEAD WITHOUT CONTRAST TECHNIQUE: Contiguous axial images were obtained from the base of the skull through the vertex without intravenous contrast. RADIATION DOSE REDUCTION: This exam was performed according to the departmental dose-optimization program which includes automated exposure control, adjustment of  the mA and/or kV according to patient size and/or use of iterative reconstruction technique. COMPARISON:  04/16/2022 FINDINGS: Brain: Generalized atrophy with ex vacuo dilatation of the ventricular system similar to previous exam. No midline shift or mass effect. Small vessel chronic ischemic changes of deep cerebral white matter. Continued evolution of intraparenchymal hemorrhage at LEFT temporal lobe. Minimal residual subarachnoid blood adjacent to medial aspect of LEFT temporal lobe. Previously identified subdural hematoma along the LEFT lateral aspect of the falx extending posteriorly to overlie the  occipital lobe and extending onto the medial aspect of the LEFT tentorium is now lower in attenuation and minimally decreased in size. No new areas of intracranial hemorrhage or infarction. No focal mass. Vascular: Atherosclerotic calcifications of internal carotid arteries at skull base Skull: Intact Sinuses/Orbits: Clear Other: N/A IMPRESSION: Continued evolution of intraparenchymal hemorrhage at LEFT temporal lobe. Minimal residual subarachnoid blood adjacent to medial aspect of LEFT temporal lobe. Previously identified subdural hematoma along the LEFT lateral aspect of the falx extending posteriorly to overlie the occipital lobe and extending onto the medial aspect of the LEFT tentorium is now lower in attenuation and minimally decreased in size. Atrophy with small vessel chronic ischemic changes of deep cerebral white matter. No new intracranial abnormalities. Electronically Signed   By: Ulyses Southward M.D.   On: 04/30/2022 16:40   DG FLUORO GUIDED NEEDLE PLC ASPIRATION/INJECTION LOC  Result Date: 04/30/2022 CLINICAL DATA:  Provided history: Left hip pain. EXAM: FLUOROSCOPICALLY-GUIDED LEFT HIP INJECTION FLUOROSCOPY: Fluoroscopy time: 54 seconds (3.4 mGy). PROCEDURE: Prior to the procedure, Alwyn Ren, NP obtained informed consent from the patient. This process included a discussion of procedural risks including but not limited to bleeding, infection, injury to nerves and blood vessels, allergic reaction and extra-articular injection. The patient was positioned supine on the fluoroscopy table. An appropriate skin entry site was determined under fluoroscopy and marked. A time-out was performed. The operated donned sterile gloves and a mask. The overlying soft tissues were prepped and draped in the usual sterile fashion. The skin and deeper subcutaneous tissues were anesthetized with 1% Lidocaine without Epinephrine. Subsequently, under fluoroscopic guidance, a 22 gauge 3.5 inch needle was advanced into the  joint space at the superolateral aspect of the femoral head/neck junction. A small amount of iodinated contrast was injected to confirm an intra-articular needle position. A solution (80 mg Depo-Medrol and 4 mL Sensorcaine) was then injected into the joint space. Fluoroscopic images were saved and sent to PACS. The needle was then removed, and a sterile dressing applied. The patient tolerated the procedure well, and no immediate post-procedure complication was apparent. The procedure was performed by Alwyn Ren, NP, and with supervised and interpreted by Dr. Jackey Loge. IMPRESSION: Technically successful fluoroscopically-guided left hip intra-articular injection of steroid and anesthetic. Electronically Signed   By: Jackey Loge D.O.   On: 04/30/2022 15:50   DG Chest 2 View  Result Date: 04/30/2022 CLINICAL DATA:  Cough. EXAM: CHEST - 2 VIEW COMPARISON:  04/11/2022 and CT chest 11/06/2005. FINDINGS: Trachea is midline. Heart is enlarged, stable. Thoracic aorta is calcified. Left lower lobe airspace consolidation with a small left pleural effusion. Right lung is clear. IMPRESSION: 1. Left lower lobe airspace consolidation, concerning for pneumonia. 2. Small left pleural effusion. Electronically Signed   By: Leanna Battles M.D.   On: 04/30/2022 14:43   DG HIP UNILAT WITH PELVIS 2-3 VIEWS LEFT  Result Date: 04/25/2022 CLINICAL DATA:  Pain after exercise and physical therapy EXAM: DG HIP (WITH OR WITHOUT  PELVIS) 2-3V LEFT COMPARISON:  Radiograph 10/25/2021 FINDINGS: No acute fracture or dislocation. Chronic posttraumatic change and heterotopic ossification about the greater trochanter. Advanced osteoarthritis of the left hip with complete loss of joint space. Less marked though still advanced osteoarthritis of the right hip. Postoperative changes of hernia repair. Vascular stents. Vascular calcifications. IMPRESSION: No definite acute fracture. 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  Electronically Signed   By: Minerva Fester M.D.   On: 04/25/2022 17:10   DG Shoulder Left  Result Date: 04/25/2022 CLINICAL DATA:  Pain, worsened with exercise EXAM: LEFT SHOULDER - 2+ VIEW COMPARISON:  03/30/2021 FINDINGS: Internal rotation, external rotation, and transscapular views of the left shoulder are obtained on 3 images. No acute fracture, subluxation, or dislocation. There is moderate osteoarthritis of the glenohumeral joint. Mild hypertrophic changes are seen at the acromioclavicular joint. Soft tissues are unremarkable. Left chest is clear. Stable vascular calcifications in the left axilla. IMPRESSION: 1. Degenerative changes of the acromioclavicular and glenohumeral joints. No acute bony abnormality. Electronically Signed   By: Sharlet Salina M.D.   On: 04/25/2022 17:09   CT HEAD WO CONTRAST ( )  Result Date: 04/18/2022 CLINICAL DATA:  Follow-up subdural hemorrhage EXAM: CT HEAD WITHOUT CONTRAST TECHNIQUE: Contiguous axial images were obtained from the base of the skull through the vertex without intravenous contrast. RADIATION DOSE REDUCTION: This exam was performed according to the departmental dose-optimization program which includes automated exposure control, adjustment of the mA and/or kV according to patient size and/or use of iterative reconstruction technique. COMPARISON:  CT head 04/11/2022 FINDINGS: Brain: There is ongoing expected evolution of the hemorrhagic contusion in the left temporal lobe with overall decreased density of the blood and mild surrounding edema. Extra-axial blood overlying the temporal lobe is decreased in volume, essentially resolved. Subdural hemorrhage layering along the falx posteriorly, left tentorial leaflet, and overlying the occipital lobe measuring up to 4 mm in thickness is similar in size but also decreased in density. There is no new acute intracranial hemorrhage, extra-axial fluid collection, or acute infarct Parenchymal volume is stable. The  ventricles are stable in size. Background chronic small-vessel ischemic change is stable. There is no mass lesion.  There is no midline shift. Vascular: No hyperdense vessel or unexpected calcification. Skull: Normal. Negative for fracture or focal lesion. Sinuses/Orbits: The imaged paranasal sinuses are clear. Bilateral lens implants are in place. The globes and orbits are otherwise unremarkable. Other: None. IMPRESSION: 1. Expected evolution of the left temporal lobe intraparenchymal hematoma with essentially resolved overlying extra-axial hemorrhage. 2. Similar size but decreased density of the subdural blood layering along the falx/tentorial leaflet and over the left occipital lobe. 3. No new acute intracranial pathology. Electronically Signed   By: Lesia Hausen M.D.   On: 04/18/2022 08:46    Labs:  Basic Metabolic Panel:    Latest Ref Rng & Units 05/07/2022    6:09 AM 05/04/2022    5:05 AM 05/03/2022    6:12 AM  BMP  Glucose 70 - 99 mg/dL 87  98  629   BUN 8 - 23 mg/dL 28  36  44   Creatinine 0.61 - 1.24 mg/dL 4.76  5.46  5.03   Sodium 135 - 145 mmol/L 136  138  139   Potassium 3.5 - 5.1 mmol/L 4.0  3.7  4.4   Chloride 98 - 111 mmol/L 112  112  115   CO2 22 - 32 mmol/L 17  19  16    Calcium 8.9 -  10.3 mg/dL 8.5  8.7  8.8      CBC:    Latest Ref Rng & Units 05/07/2022    6:09 AM 05/01/2022    7:01 AM 04/30/2022    7:00 AM  CBC  WBC 4.0 - 10.5 K/uL 7.2  8.3  10.2   Hemoglobin 13.0 - 17.0 g/dL 84.1  32.4  40.1   Hematocrit 39.0 - 52.0 % 39.4  39.5  38.7   Platelets 150 - 400 K/uL 185  315  318      CBG: No results for input(s): "GLUCAP" in the last 168 hours.   Brief HPI:   Eugene [name] is a 86 y.o. male with history of AAS, NSVT/A-fib-on Eliquis, CKD, hemochromatosis, EtOH abuse, seizures; known to CIR from 12/2021 stay after a fall with rib fractures and left H PTX.  He was admitted on 04/09/2022 after a fall and started having difficulty talking due to 3 days prior to  admission with inability to speak.  CT head done revealing 1.8 cm hemorrhagic contusion left anterior temporal lobe as well as overlying SDH/SAH with mild mass effect and moderate left pleural effusion.  Chest x-ray was repeated and showed no change in pleural effusion C/W10/04 films.  Dr. Yetta Barre was consulted for input and felt that surgical intervention was not needed and to resume anticoagulation in about a week after repeat CT head if stable.  EEG done showed mild diffuse encephalopathy without seizures or epileptiform discharges.  Speech therapy evaluation revealed severe expressive and moderate receptive aphasia with poor frustration tolerance and no attempts to communicate verbally or nonverbally.    He did have decline in respiratory status in the ED requiring supplemental oxygen and scopolamine patch was added due to handle copious oral secretions.  He was also reported to have bouts of agitation with sundowning and as Seroquel was ineffective, this was changed to Zyprexa.  Dr. Yetta Barre followed up on 11/15 with recommendations to start [facility] in 7 to 10 days after repeat CT head.  Therapy was working with patient who continued to be limited by balance deficits, strong left lateral lean with fatigue, expressive/receptive deficits with delay in processing and difficulty following one-step commands consistently.  CIR was recommended due to functional decline.   Hospital Course: Eugene [name] was admitted to rehab 04/16/2022 for inpatient therapies to consist of PT, ST and OT at least three hours five days a week. Past admission physiatrist, therapy team and rehab RN have worked together to provide customized collaborative inpatient rehab.  He was found to have cellulitis left forearm at infiltrated IV site and was treated with 7 day course of doxycycline. He was also found to have significant MASD at admission which was treated with pressure relief as well as local measures and had greatly improved by  discharge.  His sleep wake disruption with agitation at nights had resolved and Zyprexa was d/c on 11/23. He continued to have confusion at nights.  Blood pressures were monitored on TID basis and nimotop was d/c by 12/05 per recommendations. Heart rate was monitored on TID basis and metoprolol was decreased to 25 mg bid to avoid orthostatic hypotension. CT head was repeated on 11/22 showing decrease in SDH therefore Eliquis was resumed per recommendations.    Flutter valve and pulmonary hygiene were encouraged as patient with continued left moderate pleural effusion and mild pulmonary edema. His respiratory status has been stable and he was maintained on scopolamine patch to help decrease oral secretions. Protonix was  resumed due to history of GERD. Follow up CBC showed ABLA to be resolving.  He had worsening of lethargy on 12/04 and was started on IVF due to acute con chronic renal failure. CT head showed improvement in bleed.   Chest x-ray was ordered to rule out due to cough as well as concerns of infection as cause of agitation.  He was found to have LLL pneumonia and was treated with cefepime x 7 days.  MBS was repeated and showed 2 instances of silent penetration and aspiration with thin liquids despite bolus size. This was felt to be due to fatigue versus physiological deficits and chin tuck was effective in preventing penetration and aspiration however patient was unable to consistently or successfully implement the strategies therefore liquids were downgraded to nectar thick consistency.  With increase in activity, he was found to be limited by left hip and left shoulder pain which were reported to be worse since his most recent fall. Voltaren gel was used for local measures in addition to scheduled tylenol. 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 Due pain limitations, left shoulder was injected on 12/01 and   IR was consulted for  Left hip IA steroid injection performed under fluoroscopy on 12/04.  He had recurrent issues with delirium with sleep-wake disruption and Seroquel was added to help with sundowning w/agitation at nights however in therapy.  Frequently refused these medications.  Follow-up labs shows prerenal azotemia to have improved with IV fluids for hydration.  Follow-up CBC shows H&H and white count to be stable.  Patient's overall progress has been limited cognitive deficits as well as behavioral issues with inconsistent participation in therapy.  He requires min assist overall and will continue to receive follow up HHPT, HHOT and HHST by [facility] after discharge.    Rehab course: During patient's stay in rehab weekly team conferences were held to monitor patient's progress, set goals and discuss barriers to discharge. At admission, patient required max assist with basic ADL tasks and mod assist with mobility.  He exhibited moderate to severe cognitive impairments and required min to mod assist for verbal expression and mod to max assist with cognition. He was tolerating regular diet with supervision.  He has had improvement in activity tolerance, balance, postural control as well as ability to compensate for deficits.  He requires supervision upper body and mod assist with lower body ADLs.    He requires contact-guard assist for transfers and to ambulate 250 feet with rolling walker and verbal cues for safety. He is tolerating current diet without overt signs or symptoms of aspiration and requires min verbal cues to utilize swallow strategies.  Patient's speech is 90% intelligible and cognitive linguistic functions fluctuate from mod to max assist depending on fatigue.  He is able to express wants and needs with mod to max multimodal cues to self monitor and correct for errors.  Family education has been completed.    Wounds:   Disposition: home   Diet: D3, nectar  liquids.  Special Instructions: Recommend repeat BMET in 1-2 weeks to monitor for stability. Provide supervision at meals and eat only when alert.  3.  No alcohol.    Discharge Instructions     Amb Referral to Palliative Care   Complete by: As directed    Ambulatory referral to Physical Medicine Rehab   Complete by: As directed    [facility]      Allergies as of 05/10/2022       Reactions   Chlorhexidine Gluconate Itching   Don't use Chloraprep inside port a cath kits.   Macrolides And Ketolides Nausea And Vomiting   Tape Other (See Comments)   Adhesive tape,   Only use paper tape        Medication List     STOP taking these medications    atorvastatin 20 MG tablet Commonly known as: LIPITOR   cephALEXin 500 MG capsule Commonly known as: KEFLEX   docusate sodium 100 MG capsule Commonly known as: COLACE   DULoxetine 20 MG capsule Commonly known as: CYMBALTA   ferrous sulfate 325 (65 FE) MG tablet   furosemide 20 MG tablet Commonly known as: LASIX   melatonin 5 MG Tabs   niMODipine 6 MG/ML Soln Commonly known as: NYMALIZE   OLANZapine 2.5 MG tablet Commonly known as: ZYPREXA   VITAMIN D-3 PO       TAKE these medications    acetaminophen 325 MG tablet Commonly known as: TYLENOL Take 2 tablets (650 mg total) by mouth 4 (four) times daily. What changed:  medication strength how much to take when to take this reasons to take this   apixaban 5 MG Tabs tablet Commonly known as: ELIQUIS Take 1 tablet (5 mg total) by mouth 2 (two) times daily.   diclofenac Sodium 1 % Gel Commonly known as: VOLTAREN Apply 2 g topically 4 (four) times daily as needed (left shoulder pain).   levETIRAcetam 500 MG tablet Commonly known as: KEPPRA Take 1 tablet (500 mg total) by mouth 2 (two) times daily.   metoprolol tartrate 25 MG tablet Commonly known as: LOPRESSOR Take 1 tablet (25 mg total) by mouth 2 (two) times daily. What changed:  medication  strength how much to take   multivitamin with minerals Tabs tablet Take 1 tablet by mouth daily.   omeprazole 20 MG tablet Commonly known as: PRILOSEC OTC Take 1 tablet (20 mg total) by mouth daily.   polyethylene glycol 17 g packet Commonly known as: MIRALAX / GLYCOLAX Take 17 g by mouth daily as needed for mild constipation.   QUEtiapine 100 MG tablet Commonly known as: SEROQUEL Take 1 tablet (100 mg total) by mouth daily.   scopolamine 1 MG/3DAYS Commonly known as: TRANSDERM-SCOP Place 1 patch (1.5 mg total) onto the skin every 3 (three) days. Notes to patient: Change every 72 hours. Next change on Sunday   SimplyThick Easy Mix Gel Generic drug: Xanthan Gum Take 1 packet (15 g total) by mouth 4 (four) times daily.   tamsulosin 0.4 MG Caps capsule Commonly known as: FLOMAX Take 1 capsule (0.4 mg total) by mouth at bedtime.   VITAMIN B-1 PO Take 1 tablet by mouth in the morning.   VITAMIN C PO Take 1 tablet by mouth in the morning.   ZINC OXIDE EX Apply 1 application  topically See admin instructions. Apply topically diaper area every time pt uses bathroom as a skin barrier   zinc sulfate 220 (50 Zn) MG capsule Take 1 capsule (220 mg total) by mouth daily.        Follow-up Information     [name], MD. Call.   Specialty: Internal Medicine Why: Monday for post hospital follow up Contact information: [address], [address] [place] [address] [phone]         Ranelle Oyster, MD Follow up.   Specialty: Physical Medicine and  Rehabilitation Why: office will call you with follow up appointment Contact information: [address] [place] [address] [phone]         Kipp Brood, MD. Call.   Specialty: Neurosurgery Why: for hospital follow up Contact information: [address] [place] [address] [phone]         Tia Alert, MD .   Specialty: Neurosurgery Contact  information: [address] [place] [address] [phone]                 Signed: Jacquelynn Cree 05/14/2022, 12:25 AM

## 2022-05-01 NOTE — Progress Notes (Addendum)
PROGRESS NOTE   Subjective/Complaints:  Pt with restless night. C/o more cough today. Did not tolerate meds/liquids with RN this morning per OT report.   Objective:   CT HEAD WO CONTRAST ( )  Result Date: 04/30/2022 CLINICAL DATA:  Mental status changes of unknown cause EXAM: CT HEAD WITHOUT CONTRAST TECHNIQUE: Contiguous axial images were obtained from the base of the skull through the vertex without intravenous contrast. RADIATION DOSE REDUCTION: This exam was performed according to the departmental dose-optimization program which includes automated exposure control, adjustment of the mA and/or kV according to patient size and/or use of iterative reconstruction technique. COMPARISON:  04/16/2022 FINDINGS: Brain: Generalized atrophy with ex vacuo dilatation of the ventricular system similar to previous exam. No midline shift or mass effect. Small vessel chronic ischemic changes of deep cerebral white matter. Continued evolution of intraparenchymal hemorrhage at LEFT temporal lobe. Minimal residual subarachnoid blood adjacent to medial aspect of LEFT temporal lobe. Previously identified subdural hematoma along the LEFT lateral aspect of the falx extending posteriorly to overlie the occipital lobe and extending onto the medial aspect of the LEFT tentorium is now lower in attenuation and minimally decreased in size. No new areas of intracranial hemorrhage or infarction. No focal mass. Vascular: Atherosclerotic calcifications of internal carotid arteries at skull base Skull: Intact Sinuses/Orbits: Clear Other: N/A IMPRESSION: Continued evolution of intraparenchymal hemorrhage at LEFT temporal lobe. Minimal residual subarachnoid blood adjacent to medial aspect of LEFT temporal lobe. Previously identified subdural hematoma along the LEFT lateral aspect of the falx extending posteriorly to overlie the occipital lobe and extending onto the medial aspect  of the LEFT tentorium is now lower in attenuation and minimally decreased in size. Atrophy with small vessel chronic ischemic changes of deep cerebral white matter. No new intracranial abnormalities. Electronically Signed   By: Ulyses Southward M.D.   On: 04/30/2022 16:40   DG FLUORO GUIDED NEEDLE PLC ASPIRATION/INJECTION LOC  Result Date: 04/30/2022 CLINICAL DATA:  Provided history: Left hip pain. EXAM: FLUOROSCOPICALLY-GUIDED LEFT HIP INJECTION FLUOROSCOPY: Fluoroscopy time: 54 seconds (3.4 mGy). PROCEDURE: Prior to the procedure, Alwyn Ren, NP obtained informed consent from the patient. This process included a discussion of procedural risks including but not limited to bleeding, infection, injury to nerves and blood vessels, allergic reaction and extra-articular injection. The patient was positioned supine on the fluoroscopy table. An appropriate skin entry site was determined under fluoroscopy and marked. A time-out was performed. The operated donned sterile gloves and a mask. The overlying soft tissues were prepped and draped in the usual sterile fashion. The skin and deeper subcutaneous tissues were anesthetized with 1% Lidocaine without Epinephrine. Subsequently, under fluoroscopic guidance, a 22 gauge 3.5 inch needle was advanced into the joint space at the superolateral aspect of the femoral head/neck junction. A small amount of iodinated contrast was injected to confirm an intra-articular needle position. A solution (80 mg Depo-Medrol and 4 mL Sensorcaine) was then injected into the joint space. Fluoroscopic images were saved and sent to PACS. The needle was then removed, and a sterile dressing applied. The patient tolerated the procedure well, and no immediate post-procedure complication was apparent. The procedure was performed by Alwyn Ren, NP, and  with supervised and interpreted by Dr. Jackey LogeKyle Golden. IMPRESSION: Technically successful fluoroscopically-guided left hip intra-articular injection  of steroid and anesthetic. Electronically Signed   By: Jackey LogeKyle  Golden D.O.   On: 04/30/2022 15:50   DG Chest 2 View  Result Date: 04/30/2022 CLINICAL DATA:  Cough. EXAM: CHEST - 2 VIEW COMPARISON:  04/11/2022 and CT chest 11/06/2005. FINDINGS: Trachea is midline. Heart is enlarged, stable. Thoracic aorta is calcified. Left lower lobe airspace consolidation with a small left pleural effusion. Right lung is clear. IMPRESSION: 1. Left lower lobe airspace consolidation, concerning for pneumonia. 2. Small left pleural effusion. Electronically Signed   By: Leanna BattlesMelinda  Blietz M.D.   On: 04/30/2022 14:43   Recent Labs    04/30/22 0700 05/01/22 0701  WBC 10.2 8.3  HGB 12.8* 13.7  HCT 38.7* 39.5  PLT 318 315     Recent Labs    04/30/22 0700 05/01/22 0701  NA 138 137  K 4.0 3.9  CL 110 112*  CO2 15* 15*  GLUCOSE 135* 141*  BUN 43* 43*  CREATININE 1.55* 1.36*  CALCIUM 9.8 9.7      Intake/Output Summary (Last 24 hours) at 05/01/2022 0902 Last data filed at 05/01/2022 [id number] Gross per 24 hour  Intake 416 ml  Output 625 ml  Net -209 ml         Physical Exam: Vital Signs Blood pressure (!) 143/95, pulse 93, temperature (!) 97.4 F (36.3 C), temperature source Oral, resp. rate 18, height 6' (1.829 m), weight 78.6 kg, SpO2 98 %.   Constitutional: No distress . Vital signs reviewed. HEENT: NCAT, EOMI, oral membranes moist Neck: supple Cardiovascular: RRR without murmur. No JVD    Respiratory/Chest: scattered rhonchi, decreased sounds left base. Productive cough    GI/Abdomen: BS +, non-tender, non-distended Ext: no clubbing, cyanosis, tr LE edema Psych: pleasant and cooperative  Neuro: more alert, still confused, speech clearer. Oriented to self, hospital.  Follows commands.  MSK: left shoulder more moveable, still somewhat tender with ROM however. Left hip tender with ROM. Skin: improving bruises and lacs, hematoma right forearm. Chronic venous changes bilateral LE's. Other wounds  healing/resolving.     Assessment/Plan: 1. Functional deficits which require 3+ hours per day of interdisciplinary therapy in a comprehensive inpatient rehab setting. Physiatrist is providing close team supervision and 24 hour management of active medical problems listed below. Physiatrist and rehab team continue to assess barriers to discharge/monitor patient progress toward functional and medical goals  Care Tool:  Bathing    Body parts bathed by patient: Chest, Abdomen, Front perineal area, Right upper leg, Left upper leg, Face, Left arm, Buttocks   Body parts bathed by helper: Right arm, Right lower leg, Left lower leg     Bathing assist Assist Level: Minimal Assistance - Patient > 75%     Upper Body Dressing/Undressing Upper body dressing   What is the patient wearing?: Pull over shirt    Upper body assist Assist Level: Supervision/Verbal cueing    Lower Body Dressing/Undressing Lower body dressing      What is the patient wearing?: Pants, Incontinence brief     Lower body assist Assist for lower body dressing: Moderate Assistance - Patient 50 - 74%     Toileting Toileting    Toileting assist Assist for toileting: Contact Guard/Touching assist     Transfers Chair/bed transfer  Transfers assist  Chair/bed transfer activity did not occur: Safety/medical concerns  Chair/bed transfer assist level: Contact Guard/Touching assist Chair/bed transfer assistive device: Dan HumphreysWalker  Locomotion Ambulation   Ambulation assist      Assist level: Contact Guard/Touching assist Assistive device: Walker-rolling Max distance: 5   Walk 10 feet activity   Assist  Walk 10 feet activity did not occur: Safety/medical concerns (decreased activity tolerance)  Assist level: Contact Guard/Touching assist Assistive device: Walker-rolling   Walk 50 feet activity   Assist Walk 50 feet with 2 turns activity did not occur: Safety/medical concerns  Assist level: Contact  Guard/Touching assist Assistive device: Walker-rolling    Walk 150 feet activity   Assist Walk 150 feet activity did not occur: Safety/medical concerns  Assist level: Contact Guard/Touching assist Assistive device: Walker-rolling    Walk 10 feet on uneven surface  activity   Assist Walk 10 feet on uneven surfaces activity did not occur: Safety/medical concerns         Wheelchair     Assist Is the patient using a wheelchair?: Yes Type of Wheelchair: Manual    Wheelchair assist level: Dependent - Patient 0% Max wheelchair distance: >150 ft    Wheelchair 50 feet with 2 turns activity    Assist        Assist Level: Dependent - Patient 0%   Wheelchair 150 feet activity     Assist      Assist Level: Dependent - Patient 0%   Blood pressure (!) 143/95, pulse 93, temperature (!) 97.4 F (36.3 C), temperature source Oral, resp. rate 18, height 6' (1.829 m), weight 78.6 kg, SpO2 98 %.  Medical Problem List and Plan: 1. Functional deficits secondary to Left temporal contusion             -patient may  shower             -ELOS/Goals: dc on hold given medical changes, minA/Sup   -Continue CIR therapies including PT, OT, and SLP. Interdisciplinary team conference today to discuss goals, barriers to discharge, and dc planning.    -left hip and shoulder pain have been limiting  -pt more alert. Struggled with meds/fluids this morning with RN  -CT with expected evolution of bleeds  -CXR with LLL pneumonia, still dry on labs today 2.  Antithrombotics: -DVT/anticoagulation:  Mechanical: Sequential compression devices, below knee Bilateral lower extremities 11.23- Eliquis restarted after head CT- per NSU             -antiplatelet therapy: N/a 3. Pain Management: left shoulder and hip pain are chronic.    -  kpad  -scheduled tylenol  -pt refusing voltaren gel.  -Some improvement in left shoulder after injection 12/2  -IR injected left hip yesterday- appreciate  their help! 4. Mood/Behavior/Sleep: LCSW to follow for evaluation and support.             --Continue melatonin with Trazodone prn for insomnia.             -antipsychotic agents:   Zyprexa QHS  -sleep has been inconsistent. I think there is some psychosis from steroids as well as medical issues  5. Neuropsych/cognition: This patient is not quite capable of making decisions on his own behalf. 6. Skin/Wound Care: Routine pressure relief measures.  to multiple wounds  -left forearm wound healing, no cellulitis there 7. Fluids/Electrolytes/Nutrition:  .   -encourage PO as appropriate -BUN/CR sl improved 12/5---continue IVF  8. CAF: Monitor HR TID-- .              --CT with improvement    -Eliquis restarted per NSU   -no bleeding sequelae 9. H/o  GERD: Continue Pepcid--resume PPI. 10. HTN: Monitor BP TID--on nimotop thru 12/05. Continue Lopressor.      12/4 bp fairly controlled with lopressor 25mg  bid 11. Left moderate pleural effusion/ now with LLL pneumonia. 12/5 -cefipime started yesterday--appreciate pharmacy assist.      -WBC's 8k     -add mucinex for cough     12. Dysphagia -SLP to f/u re: patient's swallow 13. Seizure Hx: Keppra resumed as he was on this previously 14. Hx AAA, control BP  15. Orthostatic hypotension    bp's better with reduced lopressor--   LOS: 15 days A FACE TO FACE EVALUATION WAS PERFORMED  14/5 05/01/2022, 9:02 AM

## 2022-05-01 NOTE — Progress Notes (Signed)
Occupational Therapy Weekly Progress Note  Patient Details  Name: Eugene HUMBARGER MRN: [id number] Date of Birth: [birth year]  Beginning of progress report period: April 25, 2022 End of progress report period: May 01, 2022  Today's Date: 05/01/2022 OT Individual Time: 4037-0964 OT Individual Time Calculation (min): 55 min    Patient STGs were set to LTG as pt was supposed to DC today, however has developed PNA, increased confusion, and reversed sleep wake cycle & no sleeping for the last 3 days. Prior to todays decline, pt was consistently CGA-MIN A for balance/transfers, supervision for UB ADLs, MOD A toileting and MAX A for LB ADLs. Pt given a few more days to return to baseline and treat infection prior to returning home to a level where caregivers.   Patient continues to demonstrate the following deficits: muscle weakness, decreased cardiorespiratoy endurance, impaired timing and sequencing, abnormal tone, unbalanced muscle activation, decreased coordination, and decreased motor planning, decreased visual acuity and decreased visual perceptual skills, decreased midline orientation, decreased initiation, decreased attention, decreased awareness, decreased problem solving, decreased safety awareness, decreased memory, and delayed processing, and decreased sitting balance, decreased standing balance, decreased postural control, and decreased balance strategies and therefore will continue to benefit from skilled OT intervention to enhance overall performance with BADL and iADL.  Patient progressing toward long term goals..  Continue plan of care.  OT Short Term Goals Week 2:  OT Short Term Goal 1 (Week 2): STG=LTG d/t ELOS OT Short Term Goal 1 - Progress (Week 2): Progressing toward goal Week 3:  OT Short Term Goal 1 (Week 3): STG=LTG d/t ELOS  Skilled Therapeutic Interventions/Progress Updates:    Session 1: Pt received in recliner with no pants on. Pt very difficult to arouse  sleeping soundly. Ot with many attempts to arouse-sternal rub, warm washcloth on face, anterior weight shift. Eventually pt able to maintain eyes open for RN and OT to attempt to help pt sit to stand with RW needing max A +2 d/t poor anterior weight shift and pt lifting RLE off the floor over OT foot while trying to power up. Trialed 2 more times before OT goes to get stedy. While OT out of room, RN attempts to give pt some medicine in syrup form to which she reports he coughs/aspirates on. She follows with a sip of ice water to which the pt clearly cannot manipulate/coordinate swallow with girgling and coughing immediately. Cup provided and pt told to spit it out, however he shakes his head and continues to try swallow/cough. Pt trnasfered back to bed with +2 in stedy and OT/NT change wet brief/don pants. Alerted MD and SLP to aspiration event and decline in functional status. Pt left at end of session in bed with exit alarm on, call light in reach and all needs met. BP at end of sesssion 116/92 O2 95% and HR 95  ABS discontinued d/t ABS score less than 20 for the last three days or no behaviors present  Therapy Documentation Precautions:  Precautions Precautions: Fall Restrictions Weight Bearing Restrictions: No General:     Therapy/Group: Individual Therapy  [name] 05/01/2022, 6:50 AM

## 2022-05-01 NOTE — Progress Notes (Signed)
Physical Therapy Weekly Progress Note  Patient Details  Name: Eugene [name] MRN: [id number] Date of Birth: 11-Feb-1936  Beginning of progress report period: April 24, 2022 End of progress report period: May 01, 2022  Today's Date: 05/01/2022  Patient with delayed d/c this week due to onset of LLL pneumonia. Patient currently performs mobility at CGA-min A using RW, ambulating up to 250 feet. Family education completed with patient's daughter. Will continue to focus on mobility as medical complications are addressed prior to patient d/c.   Patient continues to demonstrate the following deficits muscle weakness and muscle joint tightness, decreased cardiorespiratoy endurance, decreased attention, decreased awareness, decreased problem solving, decreased safety awareness, decreased memory, delayed processing, and demonstrates behaviors consistent with [place] Level [place], and decreased sitting balance, decreased standing balance, decreased postural control, decreased balance strategies, and difficulty maintaining precautions and therefore will continue to benefit from skilled PT intervention to increase functional independence with mobility.  Patient progressing toward long term goals..  Plan of care revisions: extended d/c due to medical complication.  PT Short Term Goals Week 2:  PT Short Term Goal 1 (Week 2): STG=LTG due to ELOS. Week 3:  PT Short Term Goal 1 (Week 3): STG=LTG due to ELOS.  Therapy Documentation Precautions:  Precautions Precautions: Fall Restrictions Weight Bearing Restrictions: No   Therapy/Group: Individual Therapy  Tishia Maestre L Truxton Stupka PT, DPT, NCS, CBIS  05/01/2022, 6:29 AM

## 2022-05-02 ENCOUNTER — Inpatient Hospital Stay (HOSPITAL_COMMUNITY): Payer: Medicare Other

## 2022-05-02 ENCOUNTER — Other Ambulatory Visit: Payer: Medicare Other

## 2022-05-02 ENCOUNTER — Ambulatory Visit: Payer: Medicare Other | Admitting: Hematology and Oncology

## 2022-05-02 LAB — BASIC METABOLIC PANEL
Anion gap: 7 (ref 5–15)
BUN: 43 mg/dL — ABNORMAL HIGH (ref 8–23)
CO2: 18 mmol/L — ABNORMAL LOW (ref 22–32)
Calcium: 8.8 mg/dL — ABNORMAL LOW (ref 8.9–10.3)
Chloride: 114 mmol/L — ABNORMAL HIGH (ref 98–111)
Creatinine, Ser: 1.33 mg/dL — ABNORMAL HIGH (ref 0.61–1.24)
GFR, Estimated: 53 mL/min — ABNORMAL LOW (ref >60)
Glucose, Bld: 105 mg/dL — ABNORMAL HIGH (ref 70–99)
Potassium: 4 mmol/L (ref 3.5–5.1)
Sodium: 139 mmol/L (ref 135–145)

## 2022-05-02 MED ORDER — QUETIAPINE FUMARATE 50 MG PO TABS
50.0000 mg | ORAL_TABLET | Freq: Every evening | ORAL | Status: DC | PRN
  Administered 2022-05-06: 50 mg via ORAL

## 2022-05-02 MED ORDER — QUETIAPINE FUMARATE ER 50 MG PO TB24
50.0000 mg | ORAL_TABLET | Freq: Once | ORAL | Status: DC
  Filled 2022-05-02: qty 1

## 2022-05-02 MED ORDER — OLANZAPINE 5 MG PO TABS
5.0000 mg | ORAL_TABLET | Freq: Every day | ORAL | Status: DC
  Administered 2022-05-02: 5 mg via ORAL
  Filled 2022-05-02: qty 1

## 2022-05-02 NOTE — Progress Notes (Signed)
Occupational Therapy Session Note  Patient Details  Name: Eugene [name] MRN: [id number] Date of Birth: Nov 10, 1935  Today's Date: 05/02/2022 OT Individual Time: 0835-0900 OT Individual Time Calculation (min): 25 min  and Today's Date: 05/02/2022 OT Missed Time: 35 Minutes Missed Time Reason: Patient fatigue;Patient unwilling/refused to participate without medical reason   Short Term Goals: Week 1:  OT Short Term Goal 1 (Week 1): Pt will complete 2/3 steps of donning pants OT Short Term Goal 1 - Progress (Week 1): Progressing toward goal OT Short Term Goal 2 (Week 1): Pt will complete toilet transfer with LRAD at CGA level OT Short Term Goal 2 - Progress (Week 1): Met OT Short Term Goal 3 (Week 1): Pt will don shirt EOB with no A for sitting balance OT Short Term Goal 3 - Progress (Week 1): Met OT Short Term Goal 4 (Week 1): Pt will bathe with MIN A at sit to stand level OT Short Term Goal 4 - Progress (Week 1): Progressing toward goal  Skilled Therapeutic Interventions/Progress Updates:    Pt initially with lights off in room and when aroused refising tx despite offering breakfast. When returned 35 min later, pt agreeable to eat  Pt received in bed with Ot elevating head of bed >45 degrees with pt scooted all the way up in the bed for improved swallow.  ADL: Pt completes self feeding in semi fowlers position with min  cuing for swallowing stratiegies as outlined in SLP plan while consuming regular textures and thin liqiuids via 10 mL provale cup. 1 instance of cough mixing liquids with food still in mouth desite cuing overt s/s of aspirtaion. Pt needs reminders for slowing rate of food and waiting to take another bite to allow full swallow. Pt often grunting or brushing off cues for swallowing strategies per SLP plan. Bed locked in upright position to decrease aspiration/reflux risk.   Pt left at end of session in bed with exit alarm on, call light in reach and all needs  met   Therapy Documentation Precautions:  Precautions Precautions: Fall Restrictions Weight Bearing Restrictions: No General:   Therapy/Group: Individual Therapy  [name] 05/02/2022, 6:46 AM

## 2022-05-02 NOTE — Progress Notes (Signed)
Physical Therapy Note  Patient Details  Name: DEEN DEGUIA MRN: [id number] Date of Birth: 11-27-1935 Today's Date: 05/02/2022    0930: Patient off the floor for Barium Swallow Study.  1000: Patient in bed asleep upon PT arrival. Patient slow to arouse to verbal and tactile stimulation. Patient with limited verbal clarity due to lethargy, however shaking his head no to declined skilled PT interventions, mobility, toileting, bed level exercise/ROM, due to fatigue. Sleep chart not filled out and per RN no report on patient's sleep quality last night. Per MD note, MD called due to agitation overnight. Patient missed 60 min of skilled PT due to fatigue, RN made aware. Will attempt to make-up missed time as able.             Emilo Gras L Lyberti Thrush PT, DPT, NCS, CBIS  05/02/2022, 1:01 PM

## 2022-05-02 NOTE — Progress Notes (Signed)
PROGRESS NOTE   Subjective/Complaints:  Was contacted by nurse last night that he was agitated.  I ordered seroquel which was not ultimately given. I am unsure why. Pt reports cough is better.   ROS: Limited due to cognitive/behavioral   Objective:   CT HEAD WO CONTRAST (5MM)  Result Date: 04/30/2022 CLINICAL DATA:  Mental status changes of unknown cause EXAM: CT HEAD WITHOUT CONTRAST TECHNIQUE: Contiguous axial images were obtained from the base of the skull through the vertex without intravenous contrast. RADIATION DOSE REDUCTION: This exam was performed according to the departmental dose-optimization program which includes automated exposure control, adjustment of the mA and/or kV according to patient size and/or use of iterative reconstruction technique. COMPARISON:  04/16/2022 FINDINGS: Brain: Generalized atrophy with ex vacuo dilatation of the ventricular system similar to previous exam. No midline shift or mass effect. Small vessel chronic ischemic changes of deep cerebral white matter. Continued evolution of intraparenchymal hemorrhage at LEFT temporal lobe. Minimal residual subarachnoid blood adjacent to medial aspect of LEFT temporal lobe. Previously identified subdural hematoma along the LEFT lateral aspect of the falx extending posteriorly to overlie the occipital lobe and extending onto the medial aspect of the LEFT tentorium is now lower in attenuation and minimally decreased in size. No new areas of intracranial hemorrhage or infarction. No focal mass. Vascular: Atherosclerotic calcifications of internal carotid arteries at skull base Skull: Intact Sinuses/Orbits: Clear Other: N/A IMPRESSION: Continued evolution of intraparenchymal hemorrhage at LEFT temporal lobe. Minimal residual subarachnoid blood adjacent to medial aspect of LEFT temporal lobe. Previously identified subdural hematoma along the LEFT lateral aspect of the falx  extending posteriorly to overlie the occipital lobe and extending onto the medial aspect of the LEFT tentorium is now lower in attenuation and minimally decreased in size. Atrophy with small vessel chronic ischemic changes of deep cerebral white matter. No new intracranial abnormalities. Electronically Signed   By: Ulyses SouthwardMark  Boles M.D.   On: 04/30/2022 16:40   DG FLUORO GUIDED NEEDLE PLC ASPIRATION/INJECTION LOC  Result Date: 04/30/2022 CLINICAL DATA:  Provided history: Left hip pain. EXAM: FLUOROSCOPICALLY-GUIDED LEFT HIP INJECTION FLUOROSCOPY: Fluoroscopy time: 54 seconds (3.4 mGy). PROCEDURE: Prior to the procedure, Alwyn RenJamie Covington, NP obtained informed consent from the patient. This process included a discussion of procedural risks including but not limited to bleeding, infection, injury to nerves and blood vessels, allergic reaction and extra-articular injection. The patient was positioned supine on the fluoroscopy table. An appropriate skin entry site was determined under fluoroscopy and marked. A time-out was performed. The operated donned sterile gloves and a mask. The overlying soft tissues were prepped and draped in the usual sterile fashion. The skin and deeper subcutaneous tissues were anesthetized with 1% Lidocaine without Epinephrine. Subsequently, under fluoroscopic guidance, a 22 gauge 3.5 inch needle was advanced into the joint space at the superolateral aspect of the femoral head/neck junction. A small amount of iodinated contrast was injected to confirm an intra-articular needle position. A solution (80 mg Depo-Medrol and 4 mL Sensorcaine) was then injected into the joint space. Fluoroscopic images were saved and sent to PACS. The needle was then removed, and a sterile dressing applied. The patient tolerated the procedure well,  and no immediate post-procedure complication was apparent. The procedure was performed by Alwyn Ren, NP, and with supervised and interpreted by Dr. Jackey Loge.  IMPRESSION: Technically successful fluoroscopically-guided left hip intra-articular injection of steroid and anesthetic. Electronically Signed   By: Jackey Loge D.O.   On: 04/30/2022 15:50   DG Chest 2 View  Result Date: 04/30/2022 CLINICAL DATA:  Cough. EXAM: CHEST - 2 VIEW COMPARISON:  04/11/2022 and CT chest 11/06/2005. FINDINGS: Trachea is midline. Heart is enlarged, stable. Thoracic aorta is calcified. Left lower lobe airspace consolidation with a small left pleural effusion. Right lung is clear. IMPRESSION: 1. Left lower lobe airspace consolidation, concerning for pneumonia. 2. Small left pleural effusion. Electronically Signed   By: Leanna Battles M.D.   On: 04/30/2022 14:43   Recent Labs    04/30/22 0700 05/01/22 0701  WBC 10.2 8.3  HGB 12.8* 13.7  HCT 38.7* 39.5  PLT 318 315     Recent Labs    05/01/22 0701 05/02/22 0640  NA 137 139  K 3.9 4.0  CL 112* 114*  CO2 15* 18*  GLUCOSE 141* 105*  BUN 43* 43*  CREATININE 1.36* 1.33*  CALCIUM 9.7 8.8*      Intake/Output Summary (Last 24 hours) at 05/02/2022 0752 Last data filed at 05/02/2022 0449 Gross per 24 hour  Intake 2088.91 ml  Output --  Net 2088.91 ml         Physical Exam: Vital Signs Blood pressure 114/70, pulse 62, temperature 98.3 F (36.8 C), temperature source Oral, resp. rate 16, height 6' (1.829 m), weight 80.7 kg, SpO2 98 %.   Constitutional: No distress . Vital signs reviewed. HEENT: NCAT, EOMI, oral membranes moist Neck: supple Cardiovascular: RRR without murmur. No JVD    Respiratory/Chest: CTA Bilaterally without wheezes or rales. Normal effort    GI/Abdomen: BS +, non-tender, non-distended Ext: no clubbing, cyanosis, or edema Psych: pleasant and cooperative, confused  Neuro: alert but still confused, speech dysartrhic. Oriented to self only.  Follows commands.  MSK: left shoulder is more moveable, still somewhat tender with ROM however. Left hip tender with ROM but improved also Skin:  improving bruises and lacs, hematoma right forearm. Chronic venous changes bilateral LE's. Other wounds healing/resolving.     Assessment/Plan: 1. Functional deficits which require 3+ hours per day of interdisciplinary therapy in a comprehensive inpatient rehab setting. Physiatrist is providing close team supervision and 24 hour management of active medical problems listed below. Physiatrist and rehab team continue to assess barriers to discharge/monitor patient progress toward functional and medical goals  Care Tool:  Bathing    Body parts bathed by patient: Chest, Abdomen, Front perineal area, Right upper leg, Left upper leg, Face, Left arm, Buttocks   Body parts bathed by helper: Right arm, Right lower leg, Left lower leg     Bathing assist Assist Level: Minimal Assistance - Patient > 75%     Upper Body Dressing/Undressing Upper body dressing   What is the patient wearing?: Pull over shirt    Upper body assist Assist Level: Supervision/Verbal cueing    Lower Body Dressing/Undressing Lower body dressing      What is the patient wearing?: Pants, Incontinence brief     Lower body assist Assist for lower body dressing: Moderate Assistance - Patient 50 - 74%     Toileting Toileting    Toileting assist Assist for toileting: Contact Guard/Touching assist     Transfers Chair/bed transfer  Transfers assist  Chair/bed transfer activity did  not occur: Safety/medical concerns  Chair/bed transfer assist level: Contact Guard/Touching assist Chair/bed transfer assistive device: Arboriculturist assist      Assist level: Contact Guard/Touching assist Assistive device: Walker-rolling Max distance: 5   Walk 10 feet activity   Assist  Walk 10 feet activity did not occur: Safety/medical concerns (decreased activity tolerance)  Assist level: Contact Guard/Touching assist Assistive device: Walker-rolling   Walk 50 feet  activity   Assist Walk 50 feet with 2 turns activity did not occur: Safety/medical concerns  Assist level: Contact Guard/Touching assist Assistive device: Walker-rolling    Walk 150 feet activity   Assist Walk 150 feet activity did not occur: Safety/medical concerns  Assist level: Contact Guard/Touching assist Assistive device: Walker-rolling    Walk 10 feet on uneven surface  activity   Assist Walk 10 feet on uneven surfaces activity did not occur: Safety/medical concerns         Wheelchair     Assist Is the patient using a wheelchair?: Yes Type of Wheelchair: Manual    Wheelchair assist level: Dependent - Patient 0% Max wheelchair distance: >150 ft    Wheelchair 50 feet with 2 turns activity    Assist        Assist Level: Dependent - Patient 0%   Wheelchair 150 feet activity     Assist      Assist Level: Dependent - Patient 0%   Blood pressure 114/70, pulse 62, temperature 98.3 F (36.8 C), temperature source Oral, resp. rate 16, height 6' (1.829 m), weight 80.7 kg, SpO2 98 %.  Medical Problem List and Plan: 1. Functional deficits secondary to Left temporal contusion             -patient may  shower             -ELOS/Goals: dc at end of week pending medical, minA/Sup   -Continue CIR therapies including PT, OT, and SLP   -CT with expected evolution of bleeds    2.  Antithrombotics: -DVT/anticoagulation:  Mechanical: Sequential compression devices, below knee Bilateral lower extremities 11.23- Eliquis restarted after head CT- per NSU             -antiplatelet therapy: N/a 3. Pain Management: left shoulder and hip pain are chronic.    -  kpad  -scheduled tylenol  -pt refusing voltaren gel.  -Some improvement in left shoulder after injection 12/2  -IR injected left hip with perceived benefit! 4. Mood/Behavior/Sleep: LCSW to follow for evaluation and support.             --Continue melatonin with Trazodone prn for insomnia.              -antipsychotic agents:   Zyprexa QHS  -still with HS confusion despite meds. I will change timing of zyprexa to 8pm with back up seroquel dose before midnight if needed.    -expect improvement with resolution of pneumonia  5. Neuropsych/cognition: This patient is not quite capable of making decisions on his own behalf. 6. Skin/Wound Care: Routine pressure relief measures.  to multiple wounds  -left forearm wound healing, no cellulitis there 7. Fluids/Electrolytes/Nutrition:  .   -encourage PO as appropriate -BUN/CR sl improved 12/5---continue IVF  8. CAF: Monitor HR TID-- .              --CT with improvement    -Eliquis restarted per NSU   -no bleeding sequelae 9. H/o GERD: Continue Pepcid--resume PPI. 10. HTN: Monitor BP TID--on  nimotop thru 12/05. Continue Lopressor.      12/4 bp fairly controlled with lopressor 25mg  bid 11. Left moderate pleural effusion/ now with LLL pneumonia. 12/6 -cefipime started 12/4--appreciate pharmacy assist.      -WBC's 8k     -added mucinex for cough     -sounds better today, less cough     12. Dysphagia -SLP to f/u re: patient's swallow 13. Seizure Hx: Keppra resumed as he was on this previously 14. Hx AAA, control BP  15. Orthostatic hypotension    bp's better with reduced lopressor--   LOS: 16 days A FACE TO FACE EVALUATION WAS PERFORMED  14/6 05/02/2022, 7:52 AM

## 2022-05-02 NOTE — Progress Notes (Signed)
Occupational Therapy Note  Patient Details  Name: Eugene [name] MRN: [id number] Date of Birth: 1935-11-17  Today's Date: 05/02/2022  Attempted to see pt at 1322 for missed minutes. Pt aroused to therapist entrance but declined session, ADLs or getting OOB to assist pt with lunch. Caregiver was present who had questions about nectar think liquids, provided education that staff can thicken liquids. Pt left supine in bed asleep.   Pollyann Glen [facility] 05/02/2022, 1:31 PM

## 2022-05-02 NOTE — Progress Notes (Signed)
Modified Barium Swallow Progress Note  Patient Details  Name: Eugene [name] MRN: [id number] Date of Birth: January 26, 1936  Today's Date: 05/02/2022  Modified Barium Swallow completed.  Full report located under Chart Review in the Imaging Section.  Brief recommendations include the following:  Clinical Impression MBSS completed secondary to concerns for LLL PNA in the setting of hx of dysphagia and fatigue. This date, pt presents with moderate multi-phase dysphagia c/b decreased oral control, incomplete A-P transit, poor posterior bolus control, decreased BOT retraction, diminished hyolaryngeal excursion, diminished pharyngeal peristalsis, and decreased laryngeal elevation + closure. Swallow appears further complicated by curvature of epiglottis that appears rather stiff and with decreased ROM and speed of movement and fatigue.   Aforementioned findings resulted in 2 instances of silent penetration and aspiration with thin liquid despite bolus size (small and large). One instance of cord-level penetration with nectar-thick liquid via tsp that was sensed and ejected. Suspect this is secondary to fatigue vs physiological deficits given findings appear consistent with August 2023 report. No penetration nor aspiration of thin liquid with use of chin tuck; however, pt unable to consistently and successfully implement without heavy cueing this date. No penetration nor aspiration with honey-thick liquid and pureed textures; nevertheless, pt exhibits moderate to severe vallecular space with nectar to pureed textures that only minimally cleared with multiple swallows. Mild to trace along the posterior pharyngeal wall, within pyriform sinuses, and along lateral channels. No retrograde backflow noted on today's swallow study, in comparison to MBSS completed in August 2023. Did note a bubble at the level of the lower 1/3 cervical esophagus.  Given instrumental observations, recommend Dysphagia 3 and nectar-thick  liquids via cup or straw given strict adherence to aspiration and reflux precautions to include small + single bites/sips, slow rate, and multiple swallows per bolus. May consider OP f/u MBSS in 3-4 weeks to assess readiness for diet upgrade and recommendations for OP or HH ST f/u to address swallowing and cognitive deficits. May consider initiation of [facility] Protocol with chin tuck, as well as OP GI f/u if pt agrees.   Swallow Evaluation Recommendations   Recommended Consults: Consider GI evaluation   SLP Diet Recommendations: Dysphagia 3 (Mech soft) solids;Nectar thick liquid   Liquid Administration via: Cup;Straw   Medication Administration: Crushed with puree   Supervision: Patient able to self feed;Full supervision/cueing for compensatory strategies   Compensations: Small sips/bites;Slow rate;Multiple dry swallows after each bite/sip;Follow solids with liquid;Minimize environmental distractions;Clear throat intermittently   Postural Changes: Remain semi-upright after after feeds/meals (Comment);Seated upright at 90 degrees   Oral Care Recommendations: Oral care BID        Eugene [name] 05/02/2022,12:42 PM

## 2022-05-03 ENCOUNTER — Inpatient Hospital Stay (HOSPITAL_COMMUNITY): Payer: Medicare Other

## 2022-05-03 LAB — BASIC METABOLIC PANEL
Anion gap: 8 (ref 5–15)
BUN: 44 mg/dL — ABNORMAL HIGH (ref 8–23)
CO2: 16 mmol/L — ABNORMAL LOW (ref 22–32)
Calcium: 8.8 mg/dL — ABNORMAL LOW (ref 8.9–10.3)
Chloride: 115 mmol/L — ABNORMAL HIGH (ref 98–111)
Creatinine, Ser: 1.43 mg/dL — ABNORMAL HIGH (ref 0.61–1.24)
GFR, Estimated: 48 mL/min — ABNORMAL LOW (ref >60)
Glucose, Bld: 100 mg/dL — ABNORMAL HIGH (ref 70–99)
Potassium: 4.4 mmol/L (ref 3.5–5.1)
Sodium: 139 mmol/L (ref 135–145)

## 2022-05-03 MED ORDER — SODIUM CHLORIDE 0.45 % IV SOLN: Freq: Two times a day (BID) | INTRAVENOUS | Status: DC

## 2022-05-03 MED ORDER — QUETIAPINE FUMARATE 50 MG PO TABS
50.0000 mg | ORAL_TABLET | Freq: Every day | ORAL | Status: DC
  Filled 2022-05-03: qty 1

## 2022-05-03 NOTE — Progress Notes (Signed)
Patient ID: Eugene [name], male   DOB: 08/06/1935, 86 y.o.   MRN: [id number]  Per attending, pt will remain here through Monday and re-evaluated to determine when he is medically appropriate for discharge; and family is aware.   SW updated Ashley[facility] on above and will follow-up.   Cecile Sheerer, MSW, LCSWA Office: [phone] Cell: [phone] Fax: [phone]

## 2022-05-03 NOTE — Progress Notes (Signed)
Pt was agitated and confused this hs. Not following commands and trying to get out of room. Refused all hs medication with several attempts with charge nurse. Maintained safety and reinforced fall precautions.

## 2022-05-03 NOTE — Progress Notes (Signed)
Speech Language Pathology Weekly Progress and Session Note  Patient Details  Name: Eugene [name] MRN: [id number] Date of Birth: 02-20-1936  Beginning of progress report period: April 24, 2022 End of progress report period: May 03, 2022  Today's Date: 05/03/2022 SLP Individual Time: 2706-2376 SLP Individual Time Calculation (min): 42 min  Short Term Goals: Week 2: SLP Short Term Goal 1 (Week 2): Pt will tolerate regular diet with thin liquids with minimal s/sx conccerning for airway invasion and stable medical status with Mod I. SLP Short Term Goal 1 - Progress (Week 2): Not met SLP Short Term Goal 2 (Week 2): Pt will demonstrate orientation x 4 with Sup A. SLP Short Term Goal 2 - Progress (Week 2): Not met SLP Short Term Goal 3 (Week 2): Pt will demonstrate increased intellectual and emergent awareness by naming 2 deficits s/p TBI with Mod A. SLP Short Term Goal 3 - Progress (Week 2): Not met SLP Short Term Goal 4 (Week 2): Patient will demonstrate sustained attention to functional tasks for 30 minutes with Min verbal cues for redirection. SLP Short Term Goal 4 - Progress (Week 2): Not met SLP Short Term Goal 5 (Week 2): Pt will communicate wants, needs, and ideas ~90% of the time given Min A verbal cues for word-finding. SLP Short Term Goal 5 - Progress (Week 2): Not met SLP Short Term Goal 6 (Week 2): Patient will self-monitor and correct verbal errors with Mod multimodal cues. SLP Short Term Goal 6 - Progress (Week 2): Not met    New Short Term Goals: Week 3: SLP Short Term Goal 1 (Week 3): STGs=LTGs due to ELOS  Weekly Progress Updates: Patient has made minimal gains this week due to a decline in overall cognitive-linguistic and swallowing function. Current decline is due to recent PNA, increased confusion, and increased lethargy due to poor sleep/restlessness at night. Patient had a repeat MBS on 12/6 which showed trace silent aspiration of thin liquids and moderate  pharyngeal residue with solids. Suspect swallowing function impacted by fatigue and patient recommended a downgraded diet of Dys. 3 textures with nectar-thick liquids with full supervision to maximize safety. Patient also current requires overall Max A multimodal cues to complete functional and familiar tasks safely in regards to orientation, initiation and attention. Due to lethargy, patient with decreased ability to express his wants/needs with moderate-severe phonemic paraphasias and neologisms noted with decreased responsiveness to cues. Patient and family education ongoing. Patient would benefit from continued skilled SLP Intervention to maximize his swallowing and cognitive-linguistic functioning prior to discharge.       Intensity: Minumum of 1-2 x/day, 30 to 90 minutes Frequency: 3 to 5 out of 7 days Duration/Length of Stay: TBD due to medical status Treatment/Interventions: Cognitive remediation/compensation;Functional tasks;Dysphagia/aspiration precaution training;Internal/external aids;Patient/family education;Speech/Language facilitation;Therapeutic Activities;Environmental controls;Cueing hierarchy   Daily Session  Skilled Therapeutic Interventions: Skilled treatment session focused on dysphagia and communication goals. Upon arrival, patient was awake while upright in bed. SLP facilitated session by providing skilled observation with breakfast meal of Dys. 3 textures with nectar-thick liquids. Patient consumed meal without overt s/s of aspiration but required Max A multimodal cues for use of swallowing compensatory strategies. Patient minimally responsive to cues and became verbally frustrated as cueing progressed.  Recommend patient continue current diet with full supervision. Patient appeared confused throughout session with increased deficits in word-finding. Patient was only ~50% intelligible at the sentence level and became frustrated when SLP attempted to fix communication breakdown.  Patient requested to void and was  continent of bladder. Patient left upright in bed with alarm on and all needs within reach. Continue with current plan of care.       Pain No/Denies Pain  Therapy/Group: Individual Therapy  Gwenna Fuston 05/03/2022, 6:34 AM

## 2022-05-03 NOTE — Progress Notes (Signed)
Physical Therapy Session Note  Patient Details  Name: KAMONI GENTLES MRN: [id number] Date of Birth: [birth year]  Today's Date: 05/03/2022 PT Individual Time: 1000-1030 and 1545-1400 PT Individual Time Calculation (min): 30 min and 15 min  Short Term Goals: Week 3:  PT Short Term Goal 1 (Week 3): STG=LTG due to ELOS.  Skilled Therapeutic Interventions/Progress Updates:     Session 1: Patient in bed asleep upon PT arrival. Patient difficult to arouse and verbalized refusal of mobility at this time. PT noted patient was incontinent of bowel and bladder. Patient continued to decline OOB mobility, but agreeable to bed level peri-care and brief change with encouragement and education on risk of infection and skin breakdown. Patient remained with his eyes closed throughout session.   Attempted rolling with max A of 1 person, patient followed cues for bending opposite limb with mod A and reaching for opposite rail with HOH assist, however, patient with rigid movement and poor ability to sustain side-lying during peri-care without +2 assist. Called for +2 assist and RN arrived to assist. Performed L hip flexion/extension PROM while waiting for assist, noted hip flexion to 90 deg without outward signs of pain, improved from week prior where ROM was significantly limited by pain.   Patient performed rolling R/L x2 with mod A +2, with cuing and facilitation as above. Performed peri-care and lower body clothing management with total A.   Patient in chair position in the bed at end of session with breaks locked, bed alarm set, 4 rails up for patient safety with bed elevated off the ground, and all needs within reach.   Session 2: Patient in TIS w/c with caregiver in the room and patient attempting to place his feet on the floor, stating "I need to get on the ground." Initiated PT session to make up missed time from previous session and manage impulsive behaviors. Patient agreeable to gait training.  Continues to demonstrate language of confusion and increased expressive aphasia. Increased confusion noted, as patient perseverated on going home and unable to recall orientation except to self.   Patient performed sit to/from stand x1 with CGA using RW, provided cues for hand placement and controlled descent for safety. Patient ambulated >150 feet using RW with CGA. Ambulated with significantly decreased gait speed, reciprocal gait pattern, decreased step height and step length, decreased L weight shift (without reports of pain), and crouched gait throughout. Provided cues for erect posture, visual scanning, and increased step height for safety. Patient performed 2 standing rest breaks, x1 to determine when to turn around and x1 in the doorway of his room with hesitation to return to sitting and perseveration on "going home now." Patient redirectable and eventually did ambulate into the room and sit in the recliner with B lower extremities elevated.   Patient in recliner with caregiver in the room at end of session with breaks locked, seat belt alarms set, and all needs in reach.   Therapy Documentation Precautions:  Precautions Precautions: Fall Restrictions Weight Bearing Restrictions: No    Therapy/Group: Individual Therapy  Khyrin Trevathan L Sarah-Jane Nazario PT, DPT, NCS, CBIS  05/03/2022, 4:37 PM

## 2022-05-03 NOTE — Progress Notes (Signed)
PROGRESS NOTE   Subjective/Complaints:  Was contacted by nurse last night that he was agitated.  I ordered seroquel which was not ultimately given. I am unsure why. Pt reports cough is better.   ROS: Limited due to cognitive/behavioral   Objective:   DG Chest 2 View  Result Date: 05/03/2022 CLINICAL DATA:  Pneumonia. EXAM: CHEST - 2 VIEW COMPARISON:  Chest two views 04/30/2022, 04/11/2022, 03/20/2022, 01/19/2022; AP chest 08/25/2021; CT chest 11/06/2005. FINDINGS: Cardiac silhouette is mildly enlarged. Mediastinal contours are within normal limits. Moderate to high-grade atherosclerotic calcifications within the thoracic aorta. There is a persistent retrocardiac opacity and blunting of the costophrenic angle, suggesting left lower lobe airspace opacification and a small left pleural effusion. And note is made that left lower lung airspace opacity and at least small pleural effusion have not seen on all prior comparison studies including 04/30/2022, 04/11/2022, 02/28/2022, and 01/19/2022. The left lung base appears clear on 08/25/2021 frontal radiograph. No pneumothorax.  No acute skeletal abnormality. IMPRESSION: Persistent left lower lobe airspace opacification and at least small left pleural effusion. Findings are concerning for left lower lobe pneumonia and small pleural effusion. Recommend clinical correlation. Note is made that there has been a left lower lobe airspace opacification and a left pleural effusion on all the comparison radiographs from December, November, and October 2023. Electronically Signed   By: Yvonne Kendall M.D.   On: 05/03/2022 08:34   DG Swallowing Func-Speech Pathology  Result Date: 05/02/2022 Table formatting from the original result was not included. Objective Swallowing Evaluation: Type of Study: MBS-Modified Barium Swallow Study  Patient Details Name: Eugene [name] MRN: [id number] Date of Birth: 08/14/1935  Today's Date: 05/02/2022 Time: SLP Start Time (ACUTE ONLY): [id number] -SLP Stop Time (ACUTE ONLY): 1407 SLP Time Calculation (min) (ACUTE ONLY): 16 min Past Medical History: Past Medical History: Diagnosis Date  AAA (abdominal aortic aneurysm) ([place]) 06/29/2007  stent graft  Aortic stenosis 09/05/2017  Mild AS/AR by echo 01/2017.  GERD (gastroesophageal reflux disease)   Hemochromatosis   Hyperlipidemia   Hypertension   Irritable bowel syndrome   Permanent atrial fibrillation (HCC)   Seizure (HCC)   Stroke (HCC)   Transient Ischemic Attack Past Surgical History: Past Surgical History: Procedure Laterality Date  ABDOMINAL AORTIC ANEURYSM REPAIR    ENDOVASCULAR STENT INSERTION  07/18/2007  saccular infrarenal aortic aneurysm  EYE SURGERY Bilateral   Cataract    IR FLUORO GUIDE CV LINE LEFT  12/08/2018  IR FLUORO GUIDE CV LINE RIGHT  02/13/2017  IR FLUORO GUIDE CV LINE RIGHT  02/25/2017  IR US GUIDE VASC ACCESS RIGHT  02/13/2017  PICC LINE PLACE PERIPHERAL ([place] [facility])  Oct 21, 2014 HPI: Patient is an 86 y.o. male with PMH: chronic a-fib, seizure disorder, HTN, HLD, chronic ambulation dysfunction with frequent falls, AAA s/p repair, recent UTI, CVA. He was admitted in August of 2023 following fall onto side of bathtub and ultimately was evaluated and treated by SLP for dysphagia (appeared to be acute on chronic). CT Head showed 1.8 cm hemorrhagic contusion of left temporal lobe as well as overlying SDH and SAH, mild mass effect on the underlying brain parenchyma but no midline shift. He  failed [place] swallow with RN and was made NPO. BSE completed on 04/10/22 and pt placed on a Dysphagia 2/thin diet and downgraded on 04/11/22 to a full liquid diet; progressed back to Regular/thin liquids on 04/13/22 per ST recommendation. ST f/u for dysphagia tx/education.  Subjective: awake, alert, no family present  Recommendations for follow up therapy are one component of a multi-disciplinary discharge planning process, led by the attending  physician.  Recommendations may be updated based on patient status, additional functional criteria and insurance authorization. Assessment / Plan / Recommendation   05/02/2022  11:56 AM Clinical Impressions Clinical Impression MBSS completed secondary to concerns for LLL PNA in the setting of hx of dysphagia and fatigue. This date, pt presents with moderate multi-phase dysphagia c/b decreased oral control, incomplete A-P transit, poor posterior bolus control, decreased BOT retraction, diminished hyolaryngeal excursion, diminished pharyngeal peristalsis, and decreased laryngeal elevation + closure. Swallow appears further complicated by curvature of epiglottis that appears rather stiff and with decreased ROM and speed of movement and fatigue.  Aforementioned findings resulted in 2 instances of silent penetration and aspiration with thin liquid despite bolus size (small and large). One instance of cord-level penetration with nectar-thick liquid via tsp that was sensed and ejected. Suspect this is secondary to fatigue vs physiological deficits given findings appear consistent with August 2023 report. No penetration nor aspiration of thin liquid with use of chin tuck; however, pt unable to consistently and successfully implement without heavy cueing this date. No penetration nor aspiration with honey-thick liquid and pureed textures; nevertheless, pt exhibits moderate to severe vallecular space with nectar to pureed textures that only minimally cleared with multiple swallows. Mild to trace along the posterior pharyngeal wall, within pyriform sinuses, and along lateral channels. No retrograde backflow noted on today's swallow study, in comparison to MBSS completed in August 2023. Did note a bubble at the level of the lower 1/3 cervical esophagus.  Given instrumental observations, recommend Dysphagia 3 and nectar-thick liquids via cup or straw given strict adherence to aspiration and reflux precautions to include small +  single bites/sips, slow rate, and multiple swallows per bolus. May consider OP f/u MBSS in 3-4 weeks to assess readiness for diet upgrade and recommendations for OP or [place] ST f/u to address swallowing and cognitive deficits. May consider initiation of [facility] Protocol with chin tuck, as well as OP GI f/u if pt agrees.  SLP Visit Diagnosis Dysphagia, oropharyngeal phase (R13.12);Cognitive communication deficit (R41.841) Impact on safety and function Moderate aspiration risk;Mild aspiration risk     04/10/2022  10:56 AM Treatment Recommendations Treatment Recommendations Therapy as outlined in treatment plan below     05/02/2022  11:58 AM Prognosis Prognosis for Safe Diet Advancement Good Barriers to Reach Goals Cognitive deficits;Time post onset;Motivation   05/02/2022  11:56 AM Diet Recommendations SLP Diet Recommendations Dysphagia 3 (Mech soft) solids;Nectar thick liquid Liquid Administration via Cup;Straw Medication Administration Crushed with puree Compensations Small sips/bites;Slow rate;Multiple dry swallows after each bite/sip;Follow solids with liquid;Minimize environmental distractions;Clear throat intermittently Postural Changes Remain semi-upright after after feeds/meals (Comment);Seated upright at 90 degrees     05/02/2022  11:56 AM Other Recommendations Recommended Consults Consider GI evaluation Oral Care Recommendations Oral care BID   04/10/2022  10:56 AM Frequency and Duration  Speech Therapy Frequency (ACUTE ONLY) min 2x/week Treatment Duration 2 weeks     05/02/2022  11:38 AM Oral Phase Oral Phase Impaired Oral - Honey Teaspoon Reduced posterior propulsion;Weak lingual manipulation;Delayed oral transit Oral - Honey Cup  Delayed oral transit;Reduced posterior propulsion;Weak lingual manipulation Oral - Nectar Teaspoon Weak lingual manipulation;Reduced posterior propulsion;Delayed oral transit;Decreased bolus cohesion;Premature spillage Oral - Nectar Cup Weak lingual manipulation;Reduced  posterior propulsion;Delayed oral transit;Decreased bolus cohesion;Premature spillage Oral - Nectar Straw Delayed oral transit;Decreased bolus cohesion;Premature spillage;Reduced posterior propulsion Oral - Thin Teaspoon Delayed oral transit;Decreased bolus cohesion;Premature spillage;Reduced posterior propulsion;Weak lingual manipulation Oral - Thin Cup Weak lingual manipulation;Reduced posterior propulsion;Premature spillage;Decreased bolus cohesion;Delayed oral transit Oral - Thin Straw NT Oral - Puree Delayed oral transit;Reduced posterior propulsion;Weak lingual manipulation Oral - Mech Soft NT Oral - Regular NT Oral - Multi-Consistency NT Oral - Pill NT    05/02/2022  11:50 AM Pharyngeal Phase Pharyngeal Phase Impaired Pharyngeal- Honey Teaspoon Delayed swallow initiation-vallecula;Reduced pharyngeal peristalsis;Reduced epiglottic inversion;Reduced anterior laryngeal mobility;Reduced tongue base retraction;Pharyngeal residue - valleculae;Lateral channel residue;Pharyngeal residue - posterior pharnyx Pharyngeal Material does not enter airway Pharyngeal- Honey Cup Delayed swallow initiation-vallecula;Reduced pharyngeal peristalsis;Reduced epiglottic inversion;Reduced anterior laryngeal mobility;Reduced tongue base retraction;Pharyngeal residue - valleculae;Lateral channel residue;Pharyngeal residue - posterior pharnyx Pharyngeal Material does not enter airway Pharyngeal- Nectar Teaspoon Delayed swallow initiation-vallecula;Reduced pharyngeal peristalsis;Reduced epiglottic inversion;Reduced laryngeal elevation;Reduced anterior laryngeal mobility;Reduced airway/laryngeal closure;Reduced tongue base retraction;Penetration/Aspiration during swallow;Pharyngeal residue - valleculae;Pharyngeal residue - pyriform;Pharyngeal residue - posterior pharnyx;Lateral channel residue Pharyngeal Material enters airway, CONTACTS cords and then ejected out Pharyngeal- Nectar Cup Delayed swallow initiation-vallecula;Reduced  pharyngeal peristalsis;Reduced epiglottic inversion;Reduced tongue base retraction;Pharyngeal residue - valleculae;Lateral channel residue;Pharyngeal residue - posterior pharnyx;Pharyngeal residue - pyriform Pharyngeal Material does not enter airway Pharyngeal- Nectar Straw Delayed swallow initiation-vallecula;Reduced pharyngeal peristalsis;Reduced epiglottic inversion;Reduced tongue base retraction;Pharyngeal residue - valleculae;Pharyngeal residue - pyriform;Pharyngeal residue - posterior pharnyx;Lateral channel residue Pharyngeal Material does not enter airway Pharyngeal- Thin Teaspoon Delayed swallow initiation-vallecula;Reduced pharyngeal peristalsis;Reduced epiglottic inversion;Reduced tongue base retraction;Pharyngeal residue - valleculae;Reduced laryngeal elevation;Reduced anterior laryngeal mobility;Pharyngeal residue - posterior pharnyx;Penetration/Aspiration during swallow Pharyngeal Material enters airway, passes BELOW cords without attempt by patient to eject out (silent aspiration) Pharyngeal- Thin Cup Delayed swallow initiation-vallecula;Reduced pharyngeal peristalsis;Reduced epiglottic inversion;Reduced airway/laryngeal closure;Reduced tongue base retraction;Penetration/Aspiration during swallow;Pharyngeal residue - valleculae;Pharyngeal residue - pyriform;Pharyngeal residue - posterior pharnyx;Lateral channel residue Pharyngeal Material enters airway, passes BELOW cords without attempt by patient to eject out (silent aspiration) Pharyngeal- Thin Straw NT Pharyngeal- Puree Delayed swallow initiation-vallecula;Reduced pharyngeal peristalsis;Reduced epiglottic inversion;Reduced tongue base retraction;Pharyngeal residue - valleculae Pharyngeal Material does not enter airway Pharyngeal- Mechanical Soft NT Pharyngeal- Regular NT Pharyngeal- Multi-consistency NT Pharyngeal- Pill NT    05/02/2022  11:49 AM Cervical Esophageal Phase  Cervical Esophageal Phase Impaired Cervical Esophageal Comment Pt with air  sac/bubble observed with bolus transit  through the lower 1/3 of the cervical esophagus. Slightly decreased USE opening. Bethany A Lutes 05/02/2022, 12:45 PM                     Recent Labs    05/01/22 0701  WBC 8.3  HGB 13.7  HCT 39.5  PLT 315     Recent Labs    05/02/22 0640 05/03/22 0612  NA 139 139  K 4.0 4.4  CL 114* 115*  CO2 18* 16*  GLUCOSE 105* 100*  BUN 43* 44*  CREATININE 1.33* 1.43*  CALCIUM 8.8* 8.8*      Intake/Output Summary (Last 24 hours) at 05/03/2022 0911 Last data filed at 05/03/2022 0806 Gross per 24 hour  Intake 594 ml  Output 100 ml  Net 494 ml         Physical Exam: Vital Signs Blood pressure (!) 150/74, pulse 85, temperature 97.6  F (36.4 C), temperature source Oral, resp. rate 18, height 6' (1.829 m), weight 77.6 kg, SpO2 98 %.   Constitutional: No distress . Vital signs reviewed. HEENT: NCAT, EOMI, oral membranes moist Neck: supple Cardiovascular: RRR without murmur. No JVD    Respiratory/Chest: CTA Bilaterally without wheezes or rales. Normal effort    GI/Abdomen: BS +, non-tender, non-distended Ext: no clubbing, cyanosis, or edema Psych: pleasant and cooperative, confused  Neuro: alert but still confused, speech dysartrhic. Oriented to self only.  Follows commands.  MSK: left shoulder is more moveable, still somewhat tender with ROM however. Left hip tender with ROM but improved also Skin: improving bruises and lacs, hematoma right forearm. Chronic venous changes bilateral LE's. Other wounds healing/resolving.     Assessment/Plan: 1. Functional deficits which require 3+ hours per day of interdisciplinary therapy in a comprehensive inpatient rehab setting. Physiatrist is providing close team supervision and 24 hour management of active medical problems listed below. Physiatrist and rehab team continue to assess barriers to discharge/monitor patient progress toward functional and medical goals  Care Tool:  Bathing    Body parts  bathed by patient: Chest, Abdomen, Front perineal area, Right upper leg, Left upper leg, Face, Left arm, Buttocks   Body parts bathed by helper: Right arm, Right lower leg, Left lower leg     Bathing assist Assist Level: Minimal Assistance - Patient > 75%     Upper Body Dressing/Undressing Upper body dressing   What is the patient wearing?: Pull over shirt    Upper body assist Assist Level: Supervision/Verbal cueing    Lower Body Dressing/Undressing Lower body dressing      What is the patient wearing?: Pants, Incontinence brief     Lower body assist Assist for lower body dressing: Moderate Assistance - Patient 50 - 74%     Toileting Toileting    Toileting assist Assist for toileting: Contact Guard/Touching assist     Transfers Chair/bed transfer  Transfers assist  Chair/bed transfer activity did not occur: Safety/medical concerns  Chair/bed transfer assist level: Contact Guard/Touching assist Chair/bed transfer assistive device: Programmer, multimedia   Ambulation assist      Assist level: Contact Guard/Touching assist Assistive device: Walker-rolling Max distance: 5   Walk 10 feet activity   Assist  Walk 10 feet activity did not occur: Safety/medical concerns (decreased activity tolerance)  Assist level: Contact Guard/Touching assist Assistive device: Walker-rolling   Walk 50 feet activity   Assist Walk 50 feet with 2 turns activity did not occur: Safety/medical concerns  Assist level: Contact Guard/Touching assist Assistive device: Walker-rolling    Walk 150 feet activity   Assist Walk 150 feet activity did not occur: Safety/medical concerns  Assist level: Contact Guard/Touching assist Assistive device: Walker-rolling    Walk 10 feet on uneven surface  activity   Assist Walk 10 feet on uneven surfaces activity did not occur: Safety/medical concerns         Wheelchair     Assist Is the patient using a wheelchair?:  Yes Type of Wheelchair: Manual    Wheelchair assist level: Dependent - Patient 0% Max wheelchair distance: >150 ft    Wheelchair 50 feet with 2 turns activity    Assist        Assist Level: Dependent - Patient 0%   Wheelchair 150 feet activity     Assist      Assist Level: Dependent - Patient 0%   Blood pressure (!) 150/74, pulse 85, temperature 97.6 F (36.4  C), temperature source Oral, resp. rate 18, height 6' (1.829 m), weight 77.6 kg, SpO2 98 %.  Medical Problem List and Plan: 1. Functional deficits secondary to Left temporal contusion             -patient may  shower             -ELOS/Goals: dc will be pushed back to beginning of the week.    -Continue CIR therapies including PT, OT, and SLP    -CT with expected evolution of bleeds  12/7-updated his daughter on status and plan 2.  Antithrombotics: -DVT/anticoagulation:  Mechanical: Sequential compression devices, below knee Bilateral lower extremities 11.23- Eliquis restarted after head CT- per NSU             -antiplatelet therapy: N/a 3. Pain Management: left shoulder and hip pain are chronic.    -kpad  -scheduled tylenol  -pt refusing voltaren gel.  -Some improvement in left shoulder after injection 12/2  -IR injected left hip with perceived benefit! 4. Mood/Behavior/Sleep: LCSW to follow for evaluation and support.             --Continue melatonin with Trazodone prn for insomnia.             -antipsychotic agents:   Zyprexa QHS  -12/7 continued confusion despite rx of pneumonia, adjustment of meds   -dc zyprexa, try 50mg  seroquel at 2000 with back up dose   -pt has been on keppra chronically  5. Neuropsych/cognition: This patient is not  capable of making decisions on his own behalf. 6. Skin/Wound Care: Routine pressure relief measures.  to multiple wounds  -left forearm wound healing, no cellulitis there 7. Fluids/Electrolytes/Nutrition:  .   -encourage PO as appropriate -BUN/CR sl improved  12/5---continue IVF  -12/7 bun cr without change. Reduce IVF to HS  -pt isn't on any nephrotoxic meds currently  -he does have CKD 8. CAF: Monitor HR TID-- .              --CT with improvement    -Eliquis restarted per NSU   -no bleeding sequelae 9. H/o GERD:  -resumed PPI. Hold pepcid for now given renal fxn 10. HTN: Monitor BP TID--on nimotop thru 12/05. Continue Lopressor.      12/4 bp fairly controlled with lopressor 25mg  bid 11. Left moderate pleural effusion/ now with LLL pneumonia. 12/6 -cefipime started 12/4--appreciate pharmacy assist.      -WBC's 8k     -added mucinex for cough   12/7 CXR without much change. I see a definite area of consolidation/increased area which wasn't there in October   -continue abx     12. Dysphagia -SLP to f/u re: patient's swallow 13. Seizure Hx: Keppra resumed as he was on this previously 57. Hx AAA, control BP  15. Orthostatic hypotension    bp's better with reduced lopressor--   LOS: 17 days A FACE TO FACE EVALUATION WAS PERFORMED  Meredith Staggers 05/03/2022, 9:11 AM

## 2022-05-03 NOTE — Progress Notes (Signed)
Occupational Therapy TBI Note  Patient Details  Name: Eugene [name] MRN: [id number] Date of Birth: 04/30/1936  Today's Date: 05/03/2022 OT Individual Time: 1310-1410 OT Individual Time Calculation (min): 60 min    Short Term Goals: Week 1:  OT Short Term Goal 1 (Week 1): Pt will complete 2/3 steps of donning pants OT Short Term Goal 1 - Progress (Week 1): Progressing toward goal OT Short Term Goal 2 (Week 1): Pt will complete toilet transfer with LRAD at CGA level OT Short Term Goal 2 - Progress (Week 1): Met OT Short Term Goal 3 (Week 1): Pt will don shirt EOB with no A for sitting balance OT Short Term Goal 3 - Progress (Week 1): Met OT Short Term Goal 4 (Week 1): Pt will bathe with MIN A at sit to stand level OT Short Term Goal 4 - Progress (Week 1): Progressing toward goal  Skilled Therapeutic Interventions/Progress Updates:    Pt received in bed with wincing with RUE reaching. Rest and repositiong Provided for pain relief.  Caregiver present for sesion. First part of session focused on arousal with OT speaking loudly, washing face, and removing waiste belt. Pt grunting intermittently and stating no. Pt with no pants on in the recliner and saturated in urine. Pt resistive to facilitation of anterior weight shift and OT asks pt "why am I having you get up" and pt states, "because you're stupid." OT redirects pt to appropriate forms of communicating frustration to which pt closes his eyes. Alerted caregiver that this clinician would come back in 10 min to attempt to get pt to change brief as pt refused. OT left but 3 min later caregiver reporting pt ready to get up. Pt requires MOD A for initial sit to stand and balance while changing brief and advancing sweat pants past hips. Pt then escorted out to hallway in recliner to look at window. Pt then able to walk 15 feet from hallway to TIS in room with CGA.    Pt left at end of session in bed with exit alarm on, call light in reach and  all needs met   Therapy Documentation Precautions:  Precautions Precautions: Fall Restrictions Weight Bearing Restrictions: No General:      Agitated Behavior Scale: TBI ABS discontinued d/t ABS score less than 20 for the last three days or no behaviors present   Therapy/Group: Individual Therapy  [name] 05/03/2022, 6:59 AM

## 2022-05-04 LAB — COMPREHENSIVE METABOLIC PANEL
ALT: 27 U/L (ref 0–44)
AST: 20 U/L (ref 15–41)
Albumin: 3.1 g/dL — ABNORMAL LOW (ref 3.5–5.0)
Alkaline Phosphatase: 54 U/L (ref 38–126)
Anion gap: 7 (ref 5–15)
BUN: 36 mg/dL — ABNORMAL HIGH (ref 8–23)
CO2: 19 mmol/L — ABNORMAL LOW (ref 22–32)
Calcium: 8.7 mg/dL — ABNORMAL LOW (ref 8.9–10.3)
Chloride: 112 mmol/L — ABNORMAL HIGH (ref 98–111)
Creatinine, Ser: 1.24 mg/dL (ref 0.61–1.24)
GFR, Estimated: 57 mL/min — ABNORMAL LOW (ref >60)
Glucose, Bld: 98 mg/dL (ref 70–99)
Potassium: 3.7 mmol/L (ref 3.5–5.1)
Sodium: 138 mmol/L (ref 135–145)
Total Bilirubin: 1.1 mg/dL (ref 0.3–1.2)
Total Protein: 5.8 g/dL — ABNORMAL LOW (ref 6.5–8.1)

## 2022-05-04 LAB — AMMONIA: Ammonia: 15 umol/L (ref 9–35)

## 2022-05-04 MED ORDER — QUETIAPINE FUMARATE 50 MG PO TABS
50.0000 mg | ORAL_TABLET | Freq: Every day | ORAL | Status: DC
  Administered 2022-05-04 – 2022-05-05 (×2): 50 mg via ORAL
  Filled 2022-05-04 (×3): qty 1

## 2022-05-04 NOTE — Progress Notes (Addendum)
PROGRESS NOTE   Subjective/Complaints:  Pt agitated last night. Up a lot of the night again. Pt slow to arouse this morning when I arrived  ROS: Limited due to cognitive/behavioral   Objective:   DG Chest 2 View  Result Date: 05/03/2022 CLINICAL DATA:  Pneumonia. EXAM: CHEST - 2 VIEW COMPARISON:  Chest two views 04/30/2022, 04/11/2022, 03/20/2022, 01/19/2022; AP chest 08/25/2021; CT chest 11/06/2005. FINDINGS: Cardiac silhouette is mildly enlarged. Mediastinal contours are within normal limits. Moderate to high-grade atherosclerotic calcifications within the thoracic aorta. There is a persistent retrocardiac opacity and blunting of the costophrenic angle, suggesting left lower lobe airspace opacification and a small left pleural effusion. And note is made that left lower lung airspace opacity and at least small pleural effusion have not seen on all prior comparison studies including 04/30/2022, 04/11/2022, 02/28/2022, and 01/19/2022. The left lung base appears clear on 08/25/2021 frontal radiograph. No pneumothorax.  No acute skeletal abnormality. IMPRESSION: Persistent left lower lobe airspace opacification and at least small left pleural effusion. Findings are concerning for left lower lobe pneumonia and small pleural effusion. Recommend clinical correlation. Note is made that there has been a left lower lobe airspace opacification and a left pleural effusion on all the comparison radiographs from December, November, and October 2023. Electronically Signed   By: Neita Garnet M.D.   On: 05/03/2022 08:34   DG Swallowing Func-Speech Pathology  Result Date: 05/02/2022 Table formatting from the original result was not included. Objective Swallowing Evaluation: Type of Study: MBS-Modified Barium Swallow Study  Patient Details Name: KOHAN AZIZI MRN: [id number] Date of Birth: July 17, 1935 Today's Date: 05/02/2022 Time: SLP Start Time (ACUTE  ONLY): 1351 -SLP Stop Time (ACUTE ONLY): 1407 SLP Time Calculation (min) (ACUTE ONLY): 16 min Past Medical History: Past Medical History: Diagnosis Date  AAA (abdominal aortic aneurysm) (HCC) 06/29/2007  stent graft  Aortic stenosis 09/05/2017  Mild AS/AR by echo 01/2017.  GERD (gastroesophageal reflux disease)   Hemochromatosis   Hyperlipidemia   Hypertension   Irritable bowel syndrome   Permanent atrial fibrillation (HCC)   Seizure (HCC)   Stroke (HCC)   Transient Ischemic Attack Past Surgical History: Past Surgical History: Procedure Laterality Date  ABDOMINAL AORTIC ANEURYSM REPAIR    ENDOVASCULAR STENT INSERTION  07/18/2007  saccular infrarenal aortic aneurysm  EYE SURGERY Bilateral   Cataract    IR FLUORO GUIDE CV LINE LEFT  12/08/2018  IR FLUORO GUIDE CV LINE RIGHT  02/13/2017  IR FLUORO GUIDE CV LINE RIGHT  02/25/2017  IR US GUIDE VASC ACCESS RIGHT  02/13/2017  PICC LINE PLACE PERIPHERAL ([facility] HX)  Oct 21, 2014 HPI: Patient is an 87 y.o. male with PMH: chronic a-fib, seizure disorder, HTN, HLD, chronic ambulation dysfunction with frequent falls, AAA s/p repair, recent UTI, CVA. He was admitted in August of 2023 following fall onto side of bathtub and ultimately was evaluated and treated by SLP for dysphagia (appeared to be acute on chronic). CT Head showed 1.8 cm hemorrhagic contusion of left temporal lobe as well as overlying SDH and SAH, mild mass effect on the underlying brain parenchyma but no midline shift. He failed Yale swallow with RN and was made NPO.  BSE completed on 04/10/22 and pt placed on a Dysphagia 2/thin diet and downgraded on 04/11/22 to a full liquid diet; progressed back to Regular/thin liquids on 04/13/22 per ST recommendation. ST f/u for dysphagia tx/education.  Subjective: awake, alert, no family present  Recommendations for follow up therapy are one component of a multi-disciplinary discharge planning process, led by the attending physician.  Recommendations may be updated based on patient  status, additional functional criteria and insurance authorization. Assessment / Plan / Recommendation   05/02/2022  11:56 AM Clinical Impressions Clinical Impression MBSS completed secondary to concerns for LLL PNA in the setting of hx of dysphagia and fatigue. This date, pt presents with moderate multi-phase dysphagia c/b decreased oral control, incomplete A-P transit, poor posterior bolus control, decreased BOT retraction, diminished hyolaryngeal excursion, diminished pharyngeal peristalsis, and decreased laryngeal elevation + closure. Swallow appears further complicated by curvature of epiglottis that appears rather stiff and with decreased ROM and speed of movement and fatigue.  Aforementioned findings resulted in 2 instances of silent penetration and aspiration with thin liquid despite bolus size (small and large). One instance of cord-level penetration with nectar-thick liquid via tsp that was sensed and ejected. Suspect this is secondary to fatigue vs physiological deficits given findings appear consistent with August 2023 report. No penetration nor aspiration of thin liquid with use of chin tuck; however, pt unable to consistently and successfully implement without heavy cueing this date. No penetration nor aspiration with honey-thick liquid and pureed textures; nevertheless, pt exhibits moderate to severe vallecular space with nectar to pureed textures that only minimally cleared with multiple swallows. Mild to trace along the posterior pharyngeal wall, within pyriform sinuses, and along lateral channels. No retrograde backflow noted on today's swallow study, in comparison to MBSS completed in August 2023. Did note a bubble at the level of the lower 1/3 cervical esophagus.  Given instrumental observations, recommend Dysphagia 3 and nectar-thick liquids via cup or straw given strict adherence to aspiration and reflux precautions to include small + single bites/sips, slow rate, and multiple swallows per  bolus. May consider OP f/u MBSS in 3-4 weeks to assess readiness for diet upgrade and recommendations for OP or HH ST f/u to address swallowing and cognitive deficits. May consider initiation of [facility] Protocol with chin tuck, as well as OP GI f/u if pt agrees.  SLP Visit Diagnosis Dysphagia, oropharyngeal phase (R13.12);Cognitive communication deficit (R41.841) Impact on safety and function Moderate aspiration risk;Mild aspiration risk     04/10/2022  10:56 AM Treatment Recommendations Treatment Recommendations Therapy as outlined in treatment plan below     05/02/2022  11:58 AM Prognosis Prognosis for Safe Diet Advancement Good Barriers to Reach Goals Cognitive deficits;Time post onset;Motivation   05/02/2022  11:56 AM Diet Recommendations SLP Diet Recommendations Dysphagia 3 (Mech soft) solids;Nectar thick liquid Liquid Administration via Cup;Straw Medication Administration Crushed with puree Compensations Small sips/bites;Slow rate;Multiple dry swallows after each bite/sip;Follow solids with liquid;Minimize environmental distractions;Clear throat intermittently Postural Changes Remain semi-upright after after feeds/meals (Comment);Seated upright at 90 degrees     05/02/2022  11:56 AM Other Recommendations Recommended Consults Consider GI evaluation Oral Care Recommendations Oral care BID   04/10/2022  10:56 AM Frequency and Duration  Speech Therapy Frequency (ACUTE ONLY) min 2x/week Treatment Duration 2 weeks     05/02/2022  11:38 AM Oral Phase Oral Phase Impaired Oral - Honey Teaspoon Reduced posterior propulsion;Weak lingual manipulation;Delayed oral transit Oral - Honey Cup Delayed oral transit;Reduced posterior propulsion;Weak lingual manipulation Oral -  Nectar Teaspoon Weak lingual manipulation;Reduced posterior propulsion;Delayed oral transit;Decreased bolus cohesion;Premature spillage Oral - Nectar Cup Weak lingual manipulation;Reduced posterior propulsion;Delayed oral transit;Decreased bolus  cohesion;Premature spillage Oral - Nectar Straw Delayed oral transit;Decreased bolus cohesion;Premature spillage;Reduced posterior propulsion Oral - Thin Teaspoon Delayed oral transit;Decreased bolus cohesion;Premature spillage;Reduced posterior propulsion;Weak lingual manipulation Oral - Thin Cup Weak lingual manipulation;Reduced posterior propulsion;Premature spillage;Decreased bolus cohesion;Delayed oral transit Oral - Thin Straw NT Oral - Puree Delayed oral transit;Reduced posterior propulsion;Weak lingual manipulation Oral - Mech Soft NT Oral - Regular NT Oral - Multi-Consistency NT Oral - Pill NT    05/02/2022  11:50 AM Pharyngeal Phase Pharyngeal Phase Impaired Pharyngeal- Honey Teaspoon Delayed swallow initiation-vallecula;Reduced pharyngeal peristalsis;Reduced epiglottic inversion;Reduced anterior laryngeal mobility;Reduced tongue base retraction;Pharyngeal residue - valleculae;Lateral channel residue;Pharyngeal residue - posterior pharnyx Pharyngeal Material does not enter airway Pharyngeal- Honey Cup Delayed swallow initiation-vallecula;Reduced pharyngeal peristalsis;Reduced epiglottic inversion;Reduced anterior laryngeal mobility;Reduced tongue base retraction;Pharyngeal residue - valleculae;Lateral channel residue;Pharyngeal residue - posterior pharnyx Pharyngeal Material does not enter airway Pharyngeal- Nectar Teaspoon Delayed swallow initiation-vallecula;Reduced pharyngeal peristalsis;Reduced epiglottic inversion;Reduced laryngeal elevation;Reduced anterior laryngeal mobility;Reduced airway/laryngeal closure;Reduced tongue base retraction;Penetration/Aspiration during swallow;Pharyngeal residue - valleculae;Pharyngeal residue - pyriform;Pharyngeal residue - posterior pharnyx;Lateral channel residue Pharyngeal Material enters airway, CONTACTS cords and then ejected out Pharyngeal- Nectar Cup Delayed swallow initiation-vallecula;Reduced pharyngeal peristalsis;Reduced epiglottic inversion;Reduced tongue  base retraction;Pharyngeal residue - valleculae;Lateral channel residue;Pharyngeal residue - posterior pharnyx;Pharyngeal residue - pyriform Pharyngeal Material does not enter airway Pharyngeal- Nectar Straw Delayed swallow initiation-vallecula;Reduced pharyngeal peristalsis;Reduced epiglottic inversion;Reduced tongue base retraction;Pharyngeal residue - valleculae;Pharyngeal residue - pyriform;Pharyngeal residue - posterior pharnyx;Lateral channel residue Pharyngeal Material does not enter airway Pharyngeal- Thin Teaspoon Delayed swallow initiation-vallecula;Reduced pharyngeal peristalsis;Reduced epiglottic inversion;Reduced tongue base retraction;Pharyngeal residue - valleculae;Reduced laryngeal elevation;Reduced anterior laryngeal mobility;Pharyngeal residue - posterior pharnyx;Penetration/Aspiration during swallow Pharyngeal Material enters airway, passes BELOW cords without attempt by patient to eject out (silent aspiration) Pharyngeal- Thin Cup Delayed swallow initiation-vallecula;Reduced pharyngeal peristalsis;Reduced epiglottic inversion;Reduced airway/laryngeal closure;Reduced tongue base retraction;Penetration/Aspiration during swallow;Pharyngeal residue - valleculae;Pharyngeal residue - pyriform;Pharyngeal residue - posterior pharnyx;Lateral channel residue Pharyngeal Material enters airway, passes BELOW cords without attempt by patient to eject out (silent aspiration) Pharyngeal- Thin Straw NT Pharyngeal- Puree Delayed swallow initiation-vallecula;Reduced pharyngeal peristalsis;Reduced epiglottic inversion;Reduced tongue base retraction;Pharyngeal residue - valleculae Pharyngeal Material does not enter airway Pharyngeal- Mechanical Soft NT Pharyngeal- Regular NT Pharyngeal- Multi-consistency NT Pharyngeal- Pill NT    05/02/2022  11:49 AM Cervical Esophageal Phase  Cervical Esophageal Phase Impaired Cervical Esophageal Comment Pt with air sac/bubble observed with bolus transit  through the lower 1/3 of the  cervical esophagus. Slightly decreased USE opening. Bethany A Lutes 05/02/2022, 12:45 PM                     No results for input(s): "WBC", "HGB", "HCT", "PLT" in the last 72 hours.    Recent Labs    05/03/22 0612 05/04/22 0505  NA 139 138  K 4.4 3.7  CL 115* 112*  CO2 16* 19*  GLUCOSE 100* 98  BUN 44* 36*  CREATININE 1.43* 1.24  CALCIUM 8.8* 8.7*      Intake/Output Summary (Last 24 hours) at 05/04/2022 6948 Last data filed at 05/04/2022 0831 Gross per 24 hour  Intake 399 ml  Output --  Net 399 ml         Physical Exam: Vital Signs Blood pressure (!) 169/85, pulse 100, temperature 98 F (36.7 C), temperature source Oral, resp. rate 17, height 6' (1.829 m), weight 77.6 kg, SpO2  99 %.   Constitutional: No distress . Vital signs reviewed. HEENT: NCAT, EOMI, oral membranes moist Neck: supple Cardiovascular: RRR without murmur. No JVD    Respiratory/Chest: CTA Bilaterally without wheezes or rales. Normal effort    GI/Abdomen: BS +, non-tender, non-distended Ext: no clubbing, cyanosis, or edema Psych: confused, restless  Neuro: lethargic and confused, speech dysartrhic. Oriented to self only.  Follows commands.  MSK: left shoulder is more moveable, still somewhat tender with ROM however. Left hip tender with ROM but improved also Skin: improving bruises and lacs, hematoma right forearm. Chronic venous changes bilateral LE's. Other wounds healing/resolving.     Assessment/Plan: 1. Functional deficits which require 3+ hours per day of interdisciplinary therapy in a comprehensive inpatient rehab setting. Physiatrist is providing close team supervision and 24 hour management of active medical problems listed below. Physiatrist and rehab team continue to assess barriers to discharge/monitor patient progress toward functional and medical goals  Care Tool:  Bathing    Body parts bathed by patient: Chest, Abdomen, Front perineal area, Right upper leg, Left upper leg, Face,  Left arm, Buttocks   Body parts bathed by helper: Right arm, Right lower leg, Left lower leg     Bathing assist Assist Level: Minimal Assistance - Patient > 75%     Upper Body Dressing/Undressing Upper body dressing   What is the patient wearing?: Pull over shirt    Upper body assist Assist Level: Supervision/Verbal cueing    Lower Body Dressing/Undressing Lower body dressing      What is the patient wearing?: Pants, Incontinence brief     Lower body assist Assist for lower body dressing: Moderate Assistance - Patient 50 - 74%     Toileting Toileting    Toileting assist Assist for toileting: Contact Guard/Touching assist     Transfers Chair/bed transfer  Transfers assist  Chair/bed transfer activity did not occur: Safety/medical concerns  Chair/bed transfer assist level: Contact Guard/Touching assist Chair/bed transfer assistive device: Geologist, engineering   Ambulation assist      Assist level: Contact Guard/Touching assist Assistive device: Walker-rolling Max distance: 5   Walk 10 feet activity   Assist  Walk 10 feet activity did not occur: Safety/medical concerns (decreased activity tolerance)  Assist level: Contact Guard/Touching assist Assistive device: Walker-rolling   Walk 50 feet activity   Assist Walk 50 feet with 2 turns activity did not occur: Safety/medical concerns  Assist level: Contact Guard/Touching assist Assistive device: Walker-rolling    Walk 150 feet activity   Assist Walk 150 feet activity did not occur: Safety/medical concerns  Assist level: Contact Guard/Touching assist Assistive device: Walker-rolling    Walk 10 feet on uneven surface  activity   Assist Walk 10 feet on uneven surfaces activity did not occur: Safety/medical concerns         Wheelchair     Assist Is the patient using a wheelchair?: Yes Type of Wheelchair: Manual    Wheelchair assist level: Dependent - Patient 0% Max  wheelchair distance: >150 ft    Wheelchair 50 feet with 2 turns activity    Assist        Assist Level: Dependent - Patient 0%   Wheelchair 150 feet activity     Assist      Assist Level: Dependent - Patient 0%   Blood pressure (!) 169/85, pulse 100, temperature 98 F (36.7 C), temperature source Oral, resp. rate 17, height 6' (1.829 m), weight 77.6 kg, SpO2 99 %.  Medical Problem  List and Plan: 1. Functional deficits secondary to Left temporal contusion             -patient may  shower             -ELOS/Goals: dc will be pushed back to beginning of the week.    -Continue CIR therapies including PT, OT, and SLP. Therapy adjusted to QD    -CT with expected evolution of bleeds  12/7-updated his daughter on status and plan 2.  Antithrombotics: -DVT/anticoagulation:  Mechanical: Sequential compression devices, below knee Bilateral lower extremities 11.23- Eliquis restarted after head CT- per NSU             -antiplatelet therapy: N/a 3. Pain Management: left shoulder and hip pain are chronic.    -kpad  -scheduled tylenol  -pt refusing voltaren gel.  -Some improvement in left shoulder after injection 12/2  -IR injected left hip with perceived benefit! 4. Mood/Behavior/Sleep: LCSW to follow for evaluation and support.             --Continue melatonin with Trazodone prn for insomnia.             -antipsychotic agents:   Zyprexa QHS  -12/8 continued confusion despite rx of pneumonia, adjustment of meds   -dc'ed zyprexa. Pt refused seroquel and other meds last night--will try to give seroquel a little earlier   -ammonia level normal. Labs otherwise improved today   -pt has been on keppra chronically   -suspect this is sundowning in setting of TBI, pneumonia, and perhaps added reaction to recent steroid injections.   5. Neuropsych/cognition: This patient is not  capable of making decisions on his own behalf. 6. Skin/Wound Care: Routine pressure relief measures.  to  multiple wounds  -left forearm wound healing, no cellulitis there 7. Fluids/Electrolytes/Nutrition:  .   -encourage PO as appropriate -BUN/CR sl improved 12/5---continue IVF  -12/8 BUN/Cr showing improvement after reduction to HS IVF??  -pt isn't on any nephrotoxic meds currently--confirmed with pharmacy  -he does have CKD 8. CAF: Monitor HR TID-- .              --CT with improvement    -Eliquis restarted per NSU   -no bleeding sequelae 9. H/o GERD:  -resumed PPI. Hold pepcid for now given renal fxn 10. HTN: Monitor BP TID--on nimotop thru 12/05. Continue Lopressor.      12/4 bp fairly controlled with lopressor 25mg  bid 11. Left moderate pleural effusion/ now with LLL pneumonia. 12/6 -cefipime started 12/4--appreciate pharmacy assist.      -WBC's 8k     -added mucinex for cough   12/7 CXR without much change. I see a definite area of consolidation/increased area which wasn't there in October   -continue abx thru weekend     12. Dysphagia -D3 diet per SLP  -needs to be awake enough to eat 13. Seizure Hx: Keppra resumed as he was on this previously 14. Hx AAA, control BP  15. Orthostatic hypotension    bp's better with reduced lopressor--   LOS: 18 days A FACE TO FACE EVALUATION WAS PERFORMED  Ranelle OysterZachary T Ninoska Goswick 05/04/2022, 9:27 AM

## 2022-05-04 NOTE — Plan of Care (Signed)
Pt's plan of care adjusted to QD after speaking with care team and discussed with MD in team conference as pt currently unable to tolerate current therapy schedule with OT, PT, and SLP.    Feliberto Gottron, MA, CCC-SLP

## 2022-05-04 NOTE — Progress Notes (Addendum)
Pt blood pressure BP169/85 HR100  this AM. Refused Metoprolol 25mg  last night. Notified Dan. A. (PA). No new orders received

## 2022-05-04 NOTE — Progress Notes (Signed)
Speech Language Pathology Daily Session Note  Patient Details  Name: Eugene [name] MRN: [id number] Date of Birth: 07-17-1935  Today's Date: 05/04/2022 SLP Individual Time: 0932-3557 SLP Individual Time Calculation (min): 37 min  Short Term Goals: Week 3: SLP Short Term Goal 1 (Week 3): STGs=LTGs due to ELOS  Skilled Therapeutic Interventions: Skilled treatment session focused on dysphagia and cognitive goals. Upon arrival, patient was awake while upright in bed. Patient initially declined his breakfast meal of Dys. 3 textures with nectar-thick liquids due to reports of not being hungry. With encouragement and a variety of option, patient finally consumed a small muffin and ~8 oz of nectar-thick liquids. Patient without overt s/s of aspiration. Recommend patient continue current diet. Patient with decreased confusion today with minimal verbal frustration despite ongoing aphasia impacting word-finding. Patient with phonemic paraphasias but was ~75% intelligible. Patient asking appropriate questions regarding discharge date, etc. Patient also with improved ability to self-monitor and correct errors at the phrase level with extra time. Patient left upright in bed with alarm on and all needs within reach. Continue with current plan of care.      Pain No/Denies Pain   Therapy/Group: Individual Therapy  Geral Coker 05/04/2022, 12:14 PM

## 2022-05-04 NOTE — Progress Notes (Signed)
Physical Therapy Session Note  Patient Details  Name: Eugene [name] MRN: [id number] Date of Birth: 10-06-1935  Today's Date: 05/04/2022 PT Individual Time: 3567-0141 PT Individual Time Calculation (min): 24 min   Short Term Goals: Week 2:  PT Short Term Goal 1 (Week 2): STG=LTG due to ELOS. PT Short Term Goal 1 - Progress (Week 2): Progressing toward goal  Skilled Therapeutic Interventions/Progress Updates:    Pt sleeping on arrival, and while initially easily roused, pt lethargic and with eyes closed through most of session. Pt initially somewhat awake and able to initiate moving BLE toward EOB with facilitation and multimodal cueing, but when beginning to roll toward EOB or pull up for trunk elevation, pt became more lethargic. With hand over hand cueing, pt able to grab contralateral bed rail or therapist's hand, but unable to initiate pulling to complete transfer even with multiple attempts and varied cues. Pt able to verbalize "no", but further communication limited by expressive aphasia and lethargy. Therapist continued to try to orient and rouse pt, but without success d/t lethargy. Pt pulled legs back into bed with max cueing for initiation and remained in bed with 4 rails up for safety, was left with all needs in reach and alarm active.   Therapy Documentation Precautions:  Precautions Precautions: Fall Restrictions Weight Bearing Restrictions: No General:       Therapy/Group: Individual Therapy  Juluis Rainier 05/04/2022, 9:40 AM

## 2022-05-04 NOTE — Progress Notes (Signed)
Pharmacy Antibiotic Note  Eugene [name] is a 86 y.o. male admitted on 04/16/2022 with Left moderate pleural effusion/ now with LLL pneumonia. Pharmacy has been consulted for cefepime dosing. Per MD notes, planning to continue through weekend and reassess at D7.  Plan: Continue cefepime 2g IV Q12h  Trend WBC, fever, renal function F/u cultures, clinical progress, levels as indicated De-escalate when able   Height: 6' (182.9 cm) Weight: 77.6 kg (171 lb 1.2 oz) IBW/kg (Calculated) : 77.6  Temp (24hrs), Avg:98.1 F (36.7 C), Min:97.9 F (36.6 C), Max:98.4 F (36.9 C)  Recent Labs  Lab 04/30/22 0700 05/01/22 0701 05/02/22 0640 05/03/22 0612 05/04/22 0505  WBC 10.2 8.3  --   --   --   CREATININE 1.55* 1.36* 1.33* 1.43* 1.24    Estimated Creatinine Clearance: 48.7 mL/min (by C-G formula based on SCr of 1.24 mg/dL).    Allergies  Allergen Reactions   Chlorhexidine Gluconate Itching    Don't use Chloraprep inside port a cath kits.   Macrolides And Ketolides Nausea And Vomiting   Tape Other (See Comments)    Adhesive tape,   Only use paper tape    Antimicrobials this admission: Cefepime 12/04 >>    Thank you for allowing pharmacy to be a part of this patient's care.  Thelma Barge, PharmD Clinical Pharmacist

## 2022-05-04 NOTE — Progress Notes (Signed)
Occupational Therapy TBI Note  Patient Details  Name: Eugene [name] MRN: [id number] Date of Birth: 1935-12-10  Today's Date: 05/04/2022 OT Individual Time: 1035-1100 OT Individual Time Calculation (min): 25 min    Short Term Goals: Week 1:  OT Short Term Goal 1 (Week 1): Pt will complete 2/3 steps of donning pants OT Short Term Goal 1 - Progress (Week 1): Progressing toward goal OT Short Term Goal 2 (Week 1): Pt will complete toilet transfer with LRAD at CGA level OT Short Term Goal 2 - Progress (Week 1): Met OT Short Term Goal 3 (Week 1): Pt will don shirt EOB with no A for sitting balance OT Short Term Goal 3 - Progress (Week 1): Met OT Short Term Goal 4 (Week 1): Pt will bathe with MIN A at sit to stand level OT Short Term Goal 4 - Progress (Week 1): Progressing toward goal  Skilled Therapeutic Interventions/Progress Updates:     Pt received in EOB with LPN present with no pain. LPN already dressed pt reporting he wants to get up and walk ADL: Pt initially resistant to walking, then stating "I had a big poop." Checked and pt with large BM in brief. OT provides CGA for pt to stand at EOB and LPN cleanses peri area d/t large mess then advances pants past hips. Pt transfers into recliner with increased time to convince ot move from EOB. Pt declines walking at this time stating, "I'm 87 I can't."  Pt left at end of session in recliner with exit alarm on, call light in reach and all needs met   Therapy Documentation Precautions:  Precautions Precautions: Fall Restrictions Weight Bearing Restrictions: No General:    Agitated Behavior Scale: TBI ABS discontinued d/t ABS score less than 20 for the last three days or no behaviors present       Therapy/Group: Individual Therapy  [name] 05/04/2022, 6:58 AM

## 2022-05-05 DIAGNOSIS — S069X0S Unspecified intracranial injury without loss of consciousness, sequela: Secondary | ICD-10-CM

## 2022-05-05 MED ORDER — APIXABAN 5 MG PO TABS
5.0000 mg | ORAL_TABLET | Freq: Two times a day (BID) | ORAL | Status: DC
  Administered 2022-05-05 – 2022-05-10 (×8): 5 mg via ORAL
  Filled 2022-05-05 (×10): qty 1

## 2022-05-05 MED ORDER — TAMSULOSIN HCL 0.4 MG PO CAPS
0.4000 mg | ORAL_CAPSULE | Freq: Every day | ORAL | Status: DC
  Administered 2022-05-05 – 2022-05-07 (×3): 0.4 mg via ORAL
  Filled 2022-05-05 (×4): qty 1

## 2022-05-05 MED ORDER — METOPROLOL TARTRATE 25 MG PO TABS
25.0000 mg | ORAL_TABLET | Freq: Two times a day (BID) | ORAL | Status: DC
  Administered 2022-05-05 – 2022-05-10 (×8): 25 mg via ORAL
  Filled 2022-05-05 (×10): qty 1

## 2022-05-05 NOTE — Progress Notes (Signed)
Pressure injury stage II on sacrum. Unable to measure due to patient being combative and resistant. Will notify incoming nurse to follow up. Charge nurse Greggory Stallion notified

## 2022-05-05 NOTE — Progress Notes (Signed)
PROGRESS NOTE   Subjective/Complaints:  Sundowning, RN requests pm meds be moved to 7p to avoid awakening pt , pt cannot resume sleep after awakening   Pt sleeping but awakens to voice, oriented to self , recognized me as "doctor"   ROS: Limited due to cognitive/behavioral   Objective:   No results found. No results for input(s): "WBC", "HGB", "HCT", "PLT" in the last 72 hours.    Recent Labs    05/03/22 0612 05/04/22 0505  NA 139 138  K 4.4 3.7  CL 115* 112*  CO2 16* 19*  GLUCOSE 100* 98  BUN 44* 36*  CREATININE 1.43* 1.24  CALCIUM 8.8* 8.7*       Intake/Output Summary (Last 24 hours) at 05/05/2022 0858 Last data filed at 05/05/2022 6503 Gross per 24 hour  Intake 1215.68 ml  Output --  Net 1215.68 ml      Pressure Injury 05/04/22 Buttocks Right Stage 2 -  Partial thickness loss of dermis presenting as a shallow open injury with a red, pink wound bed without slough. (Active)  05/04/22 1030  Location: Buttocks  Location Orientation: Right  Staging: Stage 2 -  Partial thickness loss of dermis presenting as a shallow open injury with a red, pink wound bed without slough.  Wound Description (Comments):   Present on Admission: Yes     Physical Exam: Vital Signs Blood pressure 128/79, pulse 83, temperature 98.2 F (36.8 C), temperature source Oral, resp. rate 18, height 6' (1.829 m), weight 75 kg, SpO2 99 %.    General: No acute distress Mood and affect are appropriate Heart: Regular rate and rhythm no rubs murmurs or extra sounds Lungs: Clear to auscultation, breathing unlabored, no rales or wheezes Abdomen: Positive bowel sounds, soft nontender to palpation, nondistended Extremities: No clubbing, cyanosis, or edema Skin: No evidence of breakdown, no evidence of rash   Neuro: lethargic and confused, speech dysartrhic. Oriented to self only.  Follows commands.  MSK: left shoulder is more moveable,  still somewhat tender with ROM however. Left hip tender with ROM but improved also Skin: improving bruises and lacs, hematoma right forearm. Chronic venous changes bilateral LE's. Other wounds healing/resolving.     Assessment/Plan: 1. Functional deficits which require 3+ hours per day of interdisciplinary therapy in a comprehensive inpatient rehab setting. Physiatrist is providing close team supervision and 24 hour management of active medical problems listed below. Physiatrist and rehab team continue to assess barriers to discharge/monitor patient progress toward functional and medical goals  Care Tool:  Bathing    Body parts bathed by patient: Chest, Abdomen, Front perineal area, Right upper leg, Left upper leg, Face, Left arm, Buttocks   Body parts bathed by helper: Right arm, Right lower leg, Left lower leg     Bathing assist Assist Level: Minimal Assistance - Patient > 75%     Upper Body Dressing/Undressing Upper body dressing   What is the patient wearing?: Pull over shirt    Upper body assist Assist Level: Supervision/Verbal cueing    Lower Body Dressing/Undressing Lower body dressing      What is the patient wearing?: Pants, Incontinence brief     Lower body assist Assist  for lower body dressing: Moderate Assistance - Patient 50 - 74%     Toileting Toileting    Toileting assist Assist for toileting: Contact Guard/Touching assist     Transfers Chair/bed transfer  Transfers assist  Chair/bed transfer activity did not occur: Safety/medical concerns  Chair/bed transfer assist level: Contact Guard/Touching assist Chair/bed transfer assistive device: Geologist, engineering   Ambulation assist      Assist level: Contact Guard/Touching assist Assistive device: Walker-rolling Max distance: 5   Walk 10 feet activity   Assist  Walk 10 feet activity did not occur: Safety/medical concerns (decreased activity tolerance)  Assist level: Contact  Guard/Touching assist Assistive device: Walker-rolling   Walk 50 feet activity   Assist Walk 50 feet with 2 turns activity did not occur: Safety/medical concerns  Assist level: Contact Guard/Touching assist Assistive device: Walker-rolling    Walk 150 feet activity   Assist Walk 150 feet activity did not occur: Safety/medical concerns  Assist level: Contact Guard/Touching assist Assistive device: Walker-rolling    Walk 10 feet on uneven surface  activity   Assist Walk 10 feet on uneven surfaces activity did not occur: Safety/medical concerns         Wheelchair     Assist Is the patient using a wheelchair?: Yes Type of Wheelchair: Manual    Wheelchair assist level: Dependent - Patient 0% Max wheelchair distance: >150 ft    Wheelchair 50 feet with 2 turns activity    Assist        Assist Level: Dependent - Patient 0%   Wheelchair 150 feet activity     Assist      Assist Level: Dependent - Patient 0%   Blood pressure 128/79, pulse 83, temperature 98.2 F (36.8 C), temperature source Oral, resp. rate 18, height 6' (1.829 m), weight 75 kg, SpO2 99 %.  Medical Problem List and Plan: 1. Functional deficits secondary to Left temporal contusion             -patient may  shower             -ELOS/Goals: dc will be pushed back to beginning of the week.    -Continue CIR therapies including PT, OT, and SLP. Therapy adjusted to QD    -CT with expected evolution of bleeds  12/7-updated his daughter on status and plan 2.  Antithrombotics: -DVT/anticoagulation:  Mechanical: Sequential compression devices, below knee Bilateral lower extremities 11.23- Eliquis restarted after head CT- per NSU             -antiplatelet therapy: N/a 3. Pain Management: left shoulder and hip pain are chronic.    -kpad  -scheduled tylenol  -pt refusing voltaren gel.  -Some improvement in left shoulder after injection 12/2  -IR injected left hip with perceived benefit! 4.  Mood/Behavior/Sleep: LCSW to follow for evaluation and support.             --Continue melatonin with Trazodone prn for insomnia.             -antipsychotic agents:   Zyprexa QHS  -12/8 continued confusion despite rx of pneumonia, adjustment of meds   -dc'ed zyprexa. Pt refused seroquel and other meds last night--will try to give seroquel a little earlier   -ammonia level normal. Labs otherwise improved today   -pt has been on keppra chronically   -suspect this is sundowning in setting of TBI, pneumonia, and perhaps added reaction to recent steroid injections.  Change pm meds to 1900 to avoid awakening  pt  5. Neuropsych/cognition: This patient is not  capable of making decisions on his own behalf. 6. Skin/Wound Care: Routine pressure relief measures.  to multiple wounds  -left forearm wound healing, no cellulitis there 7. Fluids/Electrolytes/Nutrition:  .   -encourage PO as appropriate -BUN/CR sl improved 12/5---continue IVF  -12/8 BUN/Cr showing improvement after reduction to HS IVF??  -pt isn't on any nephrotoxic meds currently--confirmed with pharmacy  -he does have CKD 8. CAF: Monitor HR TID-- .              --CT with improvement    -Eliquis restarted per NSU   -no bleeding sequelae 9. H/o GERD:  -resumed PPI. Hold pepcid for now given renal fxn 10. HTN: Monitor BP TID--on nimotop thru 12/05. Continue Lopressor.      12/4 bp fairly controlled with lopressor 25mg  bid 11. Left moderate pleural effusion/ now with LLL pneumonia. 12/6 -cefipime started 12/4--appreciate pharmacy assist.      -WBC's 8k     -added mucinex for cough   12/7 CXR without much change. I see a definite area of consolidation/increased area which wasn't there in October   -continue abx thru weekend     12. Dysphagia -D3 diet per SLP  -needs to be awake enough to eat 13. Seizure Hx: Keppra resumed as he was on this previously 14. Hx AAA, control BP  15. Orthostatic hypotension    bp's better with reduced  lopressor--   LOS: 19 days A FACE TO FACE EVALUATION WAS PERFORMED  13 05/05/2022, 8:58 AM

## 2022-05-05 NOTE — Progress Notes (Signed)
Resident slept well this shift but did refused some hs medications per MAR. Appears more confused and uncooperative when awake. Pulled Right forearm IV out and replaced per IV team for ABT administration. Resisted personal care and combative. Kept clean/dry and maintained safety

## 2022-05-06 NOTE — Progress Notes (Signed)
Pt resisted personal care, ADLs, and incontinent care this AM. Very agitated  and combative swinging hands and legs at staff during personal care. Pt using abusive words on staff appears more confused and not following direction. Pt hit left posterior hand against bed rail and sustained a skin tear. Also pulling on IV tubing and writer stopped IV. Foam dressing applied to skin tear site. Notified charge nurse and oncoming shift nurse to follow up with provider.

## 2022-05-06 NOTE — Progress Notes (Signed)
PROGRESS NOTE   Subjective/Complaints:  Slept better but became agitated when awakened for am vitals ~5am   ROS: Limited due to cognitive/behavioral   Objective:   No results found. No results for input(s): "WBC", "HGB", "HCT", "PLT" in the last 72 hours.    Recent Labs    05/04/22 0505  NA 138  K 3.7  CL 112*  CO2 19*  GLUCOSE 98  BUN 36*  CREATININE 1.24  CALCIUM 8.7*       Intake/Output Summary (Last 24 hours) at 05/06/2022 0757 Last data filed at 05/05/2022 1739 Gross per 24 hour  Intake 834 ml  Output --  Net 834 ml      Pressure Injury 05/04/22 Buttocks Right Stage 2 -  Partial thickness loss of dermis presenting as a shallow open injury with a red, pink wound bed without slough. (Active)  05/04/22 1030  Location: Buttocks  Location Orientation: Right  Staging: Stage 2 -  Partial thickness loss of dermis presenting as a shallow open injury with a red, pink wound bed without slough.  Wound Description (Comments):   Present on Admission: Yes     Physical Exam: Vital Signs Blood pressure 128/87, pulse 89, temperature (!) 96.9 F (36.1 C), temperature source Oral, resp. rate 18, height 6' (1.829 m), weight 75 kg, SpO2 98 %.    General: No acute distress Mood and affect are appropriate Oriented to person Speech with mild dysarthria Refused exam    Assessment/Plan: 1. Functional deficits which require 3+ hours per day of interdisciplinary therapy in a comprehensive inpatient rehab setting. Physiatrist is providing close team supervision and 24 hour management of active medical problems listed below. Physiatrist and rehab team continue to assess barriers to discharge/monitor patient progress toward functional and medical goals  Care Tool:  Bathing    Body parts bathed by patient: Chest, Abdomen, Front perineal area, Right upper leg, Left upper leg, Face, Left arm, Buttocks   Body parts  bathed by helper: Right arm, Right lower leg, Left lower leg     Bathing assist Assist Level: Minimal Assistance - Patient > 75%     Upper Body Dressing/Undressing Upper body dressing   What is the patient wearing?: Pull over shirt    Upper body assist Assist Level: Supervision/Verbal cueing    Lower Body Dressing/Undressing Lower body dressing      What is the patient wearing?: Pants, Incontinence brief     Lower body assist Assist for lower body dressing: Moderate Assistance - Patient 50 - 74%     Toileting Toileting    Toileting assist Assist for toileting: Contact Guard/Touching assist     Transfers Chair/bed transfer  Transfers assist  Chair/bed transfer activity did not occur: Safety/medical concerns  Chair/bed transfer assist level: Contact Guard/Touching assist Chair/bed transfer assistive device: Geologist, engineering   Ambulation assist      Assist level: Contact Guard/Touching assist Assistive device: Walker-rolling Max distance: 5   Walk 10 feet activity   Assist  Walk 10 feet activity did not occur: Safety/medical concerns (decreased activity tolerance)  Assist level: Contact Guard/Touching assist Assistive device: Walker-rolling   Walk 50 feet activity  Assist Walk 50 feet with 2 turns activity did not occur: Safety/medical concerns  Assist level: Contact Guard/Touching assist Assistive device: Walker-rolling    Walk 150 feet activity   Assist Walk 150 feet activity did not occur: Safety/medical concerns  Assist level: Contact Guard/Touching assist Assistive device: Walker-rolling    Walk 10 feet on uneven surface  activity   Assist Walk 10 feet on uneven surfaces activity did not occur: Safety/medical concerns         Wheelchair     Assist Is the patient using a wheelchair?: Yes Type of Wheelchair: Manual    Wheelchair assist level: Dependent - Patient 0% Max wheelchair distance: >150 ft     Wheelchair 50 feet with 2 turns activity    Assist        Assist Level: Dependent - Patient 0%   Wheelchair 150 feet activity     Assist      Assist Level: Dependent - Patient 0%   Blood pressure 128/87, pulse 89, temperature (!) 96.9 F (36.1 C), temperature source Oral, resp. rate 18, height 6' (1.829 m), weight 75 kg, SpO2 98 %.  Medical Problem List and Plan: 1. Functional deficits secondary to Left temporal contusion             -patient may  shower             -ELOS/Goals: dc will be pushed back to beginning of the week.    -Continue CIR therapies including PT, OT, and SLP. Therapy adjusted to QD    -CT with expected evolution of bleeds  Sundowning with agitation but is relatively clear during the day , will try to minimize diruptions at night will change vitals to 8am  2.  Antithrombotics: -DVT/anticoagulation:  Mechanical: Sequential compression devices, below knee Bilateral lower extremities 11.23- Eliquis restarted after head CT- per NSU             -antiplatelet therapy: N/a 3. Pain Management: left shoulder and hip pain are chronic.    -kpad  -scheduled tylenol  -pt refusing voltaren gel.  -Some improvement in left shoulder after injection 12/2  -IR injected left hip with perceived benefit! 4. Mood/Behavior/Sleep: LCSW to follow for evaluation and support.             --Continue melatonin with Trazodone prn for insomnia.             -antipsychotic agents:   Zyprexa QHS  -12/8 continued confusion despite rx of pneumonia, adjustment of meds   -dc'ed zyprexa. Pt refused seroquel and other meds last night--will try to give seroquel a little earlier   -ammonia level normal. Labs otherwise improved today   -pt has been on keppra chronically   -suspect this is sundowning in setting of TBI, pneumonia, and perhaps added reaction to recent steroid injections.  Change pm meds to 1900 to avoid awakening pt  5. Neuropsych/cognition: This patient is not  capable  of making decisions on his own behalf. 6. Skin/Wound Care: Routine pressure relief measures.  to multiple wounds  -left forearm wound healing, no cellulitis there 7. Fluids/Electrolytes/Nutrition:  .   -encourage PO as appropriate -BUN/CR sl improved 12/5---continue IVF  -12/8 BUN/Cr showing improvement after reduction to HS IVF??  -pt isn't on any nephrotoxic meds currently--confirmed with pharmacy  -he does have CKD 8. CAF: Monitor HR TID-- .              --CT with improvement    -Eliquis restarted per NSU   -  no bleeding sequelae 9. H/o GERD:  -resumed PPI. Hold pepcid for now given renal fxn 10. HTN: Monitor BP TID--on nimotop thru 12/05. Continue Lopressor.      12/4 bp fairly controlled with lopressor 25mg  bid 11. Left moderate pleural effusion/ now with LLL pneumonia. 12/6 -cefipime started 12/4--appreciate pharmacy assist.      -WBC's 8k     -added mucinex for cough   12/7 CXR without much change. I see a definite area of consolidation/increased area which wasn't there in October   -continue abx thru weekend     12. Dysphagia -D3 diet per SLP  -needs to be awake enough to eat 13. Seizure Hx: Keppra resumed as he was on this previously 14. Hx AAA, control BP  15. Orthostatic hypotension    bp's better with reduced lopressor--   LOS: 20 days A FACE TO FACE EVALUATION WAS PERFORMED  13 05/06/2022, 7:57 AM

## 2022-05-06 NOTE — Plan of Care (Signed)
Incontinence mostly present c episodes of confusion

## 2022-05-07 LAB — BASIC METABOLIC PANEL
Anion gap: 7 (ref 5–15)
BUN: 28 mg/dL — ABNORMAL HIGH (ref 8–23)
CO2: 17 mmol/L — ABNORMAL LOW (ref 22–32)
Calcium: 8.5 mg/dL — ABNORMAL LOW (ref 8.9–10.3)
Chloride: 112 mmol/L — ABNORMAL HIGH (ref 98–111)
Creatinine, Ser: 1.2 mg/dL (ref 0.61–1.24)
GFR, Estimated: 60 mL/min — ABNORMAL LOW (ref >60)
Glucose, Bld: 87 mg/dL (ref 70–99)
Potassium: 4 mmol/L (ref 3.5–5.1)
Sodium: 136 mmol/L (ref 135–145)

## 2022-05-07 LAB — CBC
HCT: 39.4 % (ref 39.0–52.0)
Hemoglobin: 12.6 g/dL — ABNORMAL LOW (ref 13.0–17.0)
MCH: 30.3 pg (ref 26.0–34.0)
MCHC: 32 g/dL (ref 30.0–36.0)
MCV: 94.7 fL (ref 80.0–100.0)
Platelets: 185 10*3/uL (ref 150–400)
RBC: 4.16 MIL/uL — ABNORMAL LOW (ref 4.22–5.81)
RDW: 15.5 % (ref 11.5–15.5)
WBC: 7.2 10*3/uL (ref 4.0–10.5)
nRBC: 0 % (ref 0.0–0.2)

## 2022-05-07 MED ORDER — QUETIAPINE FUMARATE 50 MG PO TABS
100.0000 mg | ORAL_TABLET | Freq: Every day | ORAL | Status: DC
  Administered 2022-05-07: 100 mg via ORAL
  Filled 2022-05-07 (×3): qty 2

## 2022-05-07 NOTE — Progress Notes (Signed)
Physical Therapy Note  Patient Details  Name: Eugene [name] MRN: [id number] Date of Birth: August 08, 1935 Today's Date: 05/07/2022    1000: Patient in bed asleep upon PT arrival. Patient slow to arouse to verbal and tactile stimulation. Patient declined skilled PT interventions, mobility, toileting, bed level exercise/ROM, due to fatigue. Per RN, patient declined OOB mobility with nursing this morning and slept only 4 hours last night. Patient missed 30 min of skilled PT due to fatigue, RN made aware. Will attempt to make-up missed time as able.            Louis Ivery L Johonna Binette PT, DPT, NCS, CBIS  05/07/2022, 10:15 AM

## 2022-05-07 NOTE — Progress Notes (Signed)
Patient ID: Eugene [name], male   DOB: 1935-07-20, 86 y.o.   MRN: [id number]  Per attending, pt could possibly discharge by end of the week.  SW spoke with pt dtr [name] to inform on above. She states her brother mentioned a conversation about hospice. SW shared unaware of this conversation. She stated she would like to know if he will remain this way. SW informed will share her concerns with the medical team.   Cecile Sheerer, MSW, LCSWA Office: [phone] Cell: [phone] Fax: [phone]

## 2022-05-07 NOTE — Progress Notes (Signed)
Speech Language Pathology Daily Session Note  Patient Details  Name: Eugene [name] MRN: [id number] Date of Birth: [birth year]  Today's Date: 05/07/2022 SLP Individual Time: 2683-4196 SLP Individual Time Calculation (min): 45 min  Short Term Goals: Week 3: SLP Short Term Goal 1 (Week 3): STGs=LTGs due to ELOS  Skilled Therapeutic Interventions: Pt seen this date for skilled ST intervention targeting swallowing and cognitive goals outlined in care plan. Pt received awake/alert and upright in bed; eating breakfast with NT present providing full supervision. SLP took over supervision upon arrival. Agreeable to intervention. Language of confusion noted throughout, with intermittent verbal agitation particularly re: cares (e.g. changing brief, repositioning in bed).  SLP facilitated orientation by providing external aids and Max A multimodal cues for pt to verbalize awareness of location, situation, and date. Phonemic paraphasias noted when attempting to verbalize orientation information, and only able to correct ~20% of the time when given clinician model prompts (no attempt to correct without prompts). Additionally, facilitated verbal expression via therapeutic confrontational naming task of basic body parts, in which pt achieved 60% accuracy without cues; improve to 80% accuracy given Min A verbal cues. Total A for recall of breakfast foods following delay with distractions, and attended to individual tasks (functional and therapeutic) for ~2-3 minute intervals before requiring redirection. During PO intake, no clinical s/sx concerning for airway invasion observed until NT began to lower [name] for brief change, at which time coughing was observed. Suspect this was due to pharyngeal residuals, observed on MBSS, and NT attempting to lower HOB following AM meal. Educated pt and NT on results of MBSS and that pt must remain upright for at least 20-30 minutes after PO. Once [name] was repositioned, coughing  abated. Attempted to educate pt on results of MBSS by showing him the video from the study, though pt unable to fully understand education and need for aspiration precautions; continue to reinforce. Continues to have difficulty expressing wants/needs with and without use of supportive communication interventions to include yes/no and close-ended questions with choice prompts.  Pt left in room and in bed with all safety measures activated and call bell within reach. Continue per current ST POC.  Pain No pain reported; NAD  Therapy/Group: Individual Therapy  Perian Tedder A Avah Bashor 05/07/2022, 9:29 AM

## 2022-05-07 NOTE — Progress Notes (Signed)
Occupational Therapy Session Note  Patient Details  Name: Eugene [name] MRN: [id number] Date of Birth: 1936-02-11  Today's Date: 05/07/2022 OT Individual Time: 6811-5726 OT Individual Time Calculation (min): 15 min    Short Term Goals: Week 1:  OT Short Term Goal 1 (Week 1): Pt will complete 2/3 steps of donning pants OT Short Term Goal 1 - Progress (Week 1): Progressing toward goal OT Short Term Goal 2 (Week 1): Pt will complete toilet transfer with LRAD at CGA level OT Short Term Goal 2 - Progress (Week 1): Met OT Short Term Goal 3 (Week 1): Pt will don shirt EOB with no A for sitting balance OT Short Term Goal 3 - Progress (Week 1): Met OT Short Term Goal 4 (Week 1): Pt will bathe with MIN A at sit to stand level OT Short Term Goal 4 - Progress (Week 1): Progressing toward goal  Skilled Therapeutic Interventions/Progress Updates:    Upon OT arrival, pt seated in recliner with nursing present and caregiver in room. Pt initially agreeable to OT session. Pt attempts to perform stand pivot transfer into w/c with Max A x 2 using RW and pt unable to achieve full stand. With subsequent attempts pt becomes more resistive to standing using RW. Attempt to use sara stedy to achieve stand. Verbal and tactile cues provided to initiate standing and pt continues to resist. Pt educated on benefits of engaging in OT services but pt continues to decline getting up. Pt missed 15 minutes of OT treatment time. RN aware. Will make up minutes as able.   Therapy Documentation Precautions:  Precautions Precautions: Fall Restrictions Weight Bearing Restrictions: No General: General OT Amount of Missed Time: 15 Minutes   Therapy/Group: Individual Therapy  Marvetta Gibbons 05/07/2022, 4:51 PM

## 2022-05-07 NOTE — Progress Notes (Signed)
PROGRESS NOTE   Subjective/Complaints:  Pt up in bed. In good spirits. Remembered me. Didn't sleep much last night.   ROS: Patient denies fever, rash, sore throat, blurred vision, dizziness, nausea, vomiting, diarrhea, cough, shortness of breath or chest pain, joint or back/neck pain, headache, or mood change.   Objective:   No results found. Recent Labs    05/07/22 0609  WBC 7.2  HGB 12.6*  HCT 39.4  PLT 185      Recent Labs    05/07/22 0609  NA 136  K 4.0  CL 112*  CO2 17*  GLUCOSE 87  BUN 28*  CREATININE 1.20  CALCIUM 8.5*      Intake/Output Summary (Last 24 hours) at 05/07/2022 1041 Last data filed at 05/07/2022 0756 Gross per 24 hour  Intake 4909.96 ml  Output --  Net 4909.96 ml     Pressure Injury 05/04/22 Buttocks Right Stage 2 -  Partial thickness loss of dermis presenting as a shallow open injury with a red, pink wound bed without slough. (Active)  05/04/22 1030  Location: Buttocks  Location Orientation: Right  Staging: Stage 2 -  Partial thickness loss of dermis presenting as a shallow open injury with a red, pink wound bed without slough.  Wound Description (Comments):   Present on Admission: Yes     Physical Exam: Vital Signs Blood pressure 120/82, pulse 84, temperature 98 F (36.7 C), resp. rate 18, height 6' (1.829 m), weight 77.4 kg, SpO2 99 %.     General: Alert and oriented x 3, No apparent distress HEENT: Head is normocephalic, atraumatic, PERRLA, EOMI, sclera anicteric, oral mucosa pink and moist, dentition intact, ext ear canals clear,  Neck: Supple without JVD or lymphadenopathy Heart: Reg rate and rhythm. No murmurs rubs or gallops Chest: CTA bilaterally without wheezes, rales, or rhonchi; no distress Abdomen: Soft, non-tender, non-distended, bowel sounds positive. Extremities: No clubbing, cyanosis, or edema. Pulses are 2+ Psych: flat but does engage. Was  cooperative Skin: Clean and intact without signs of breakdown Neuro:  Pt oriented to self. Needed cues for place. Speech dysarthric but clearer. Limited insight and awareness.  Musculoskeletal: left shoulder and hip still tender but more tolerant of PROM   Assessment/Plan: 1. Functional deficits which require 3+ hours per day of interdisciplinary therapy in a comprehensive inpatient rehab setting. Physiatrist is providing close team supervision and 24 hour management of active medical problems listed below. Physiatrist and rehab team continue to assess barriers to discharge/monitor patient progress toward functional and medical goals  Care Tool:  Bathing    Body parts bathed by patient: Chest, Abdomen, Front perineal area, Right upper leg, Left upper leg, Face, Left arm, Buttocks   Body parts bathed by helper: Right arm, Right lower leg, Left lower leg     Bathing assist Assist Level: Minimal Assistance - Patient > 75%     Upper Body Dressing/Undressing Upper body dressing   What is the patient wearing?: Pull over shirt    Upper body assist Assist Level: Supervision/Verbal cueing    Lower Body Dressing/Undressing Lower body dressing      What is the patient wearing?: Pants, Incontinence brief  Lower body assist Assist for lower body dressing: Moderate Assistance - Patient 50 - 74%     Toileting Toileting    Toileting assist Assist for toileting: Contact Guard/Touching assist     Transfers Chair/bed transfer  Transfers assist  Chair/bed transfer activity did not occur: Safety/medical concerns  Chair/bed transfer assist level: Contact Guard/Touching assist Chair/bed transfer assistive device: Geologist, engineering   Ambulation assist      Assist level: Contact Guard/Touching assist Assistive device: Walker-rolling Max distance: 5   Walk 10 feet activity   Assist  Walk 10 feet activity did not occur: Safety/medical concerns (decreased  activity tolerance)  Assist level: Contact Guard/Touching assist Assistive device: Walker-rolling   Walk 50 feet activity   Assist Walk 50 feet with 2 turns activity did not occur: Safety/medical concerns  Assist level: Contact Guard/Touching assist Assistive device: Walker-rolling    Walk 150 feet activity   Assist Walk 150 feet activity did not occur: Safety/medical concerns  Assist level: Contact Guard/Touching assist Assistive device: Walker-rolling    Walk 10 feet on uneven surface  activity   Assist Walk 10 feet on uneven surfaces activity did not occur: Safety/medical concerns         Wheelchair     Assist Is the patient using a wheelchair?: Yes Type of Wheelchair: Manual    Wheelchair assist level: Dependent - Patient 0% Max wheelchair distance: >150 ft    Wheelchair 50 feet with 2 turns activity    Assist        Assist Level: Dependent - Patient 0%   Wheelchair 150 feet activity     Assist      Assist Level: Dependent - Patient 0%   Blood pressure 120/82, pulse 84, temperature 98 F (36.7 C), resp. rate 18, height 6' (1.829 m), weight 77.4 kg, SpO2 99 %.  Medical Problem List and Plan: 1. Functional deficits secondary to Left temporal contusion             -patient may  shower             -ELOS/Goals: dc will be pushed back to beginning of the week.    --Continue CIR therapies including PT, OT, and SLP. Therapy adjusted to QD    -CT with expected evolution of bleeds   2.  Antithrombotics: -DVT/anticoagulation:  Mechanical: Sequential compression devices, below knee Bilateral lower extremities 11.23- Eliquis restarted after head CT- per NSU             -antiplatelet therapy: N/a 3. Pain Management: left shoulder and hip pain are chronic.    -kpad  -scheduled tylenol  -pt refusing voltaren gel.  -Some improvement in left shoulder after injection 12/2  -IR injected left hip with perceived benefit 4. Mood/Behavior/Sleep:  LCSW to follow for evaluation and support.             --Continue melatonin with Trazodone prn for insomnia.             -antipsychotic agents:    Seroquel  -12/8 continued confusion despite rx of pneumonia, adjustment of meds   - ammonia level normal. Labs otherwise improved today   -pt has been on keppra chronically   -suspect this is sundowning in setting of TBI, pneumonia, and perhaps added reaction to recent steroid injections.     -Giving meds earlier at night. Adjust seroquel to 100mg  at 1900 -need to regain sleep pattern  5. Neuropsych/cognition: This patient is not  capable of making decisions  on his own behalf. 6. Skin/Wound Care: Routine pressure relief measures.  to multiple wounds  -left forearm wound healing, no cellulitis there 7. Fluids/Electrolytes/Nutrition:  .   -encourage PO as appropriate -BUN/CR sl improved 12/5  -12/11 BUN/Cr normalizing--will [facility] at [facility]  -he does have CKD 8. CAF: Monitor HR TID-- .              --CT with improvement    -Eliquis restarted per NSU   -no bleeding sequelae 9. H/o GERD:  -resumed PPI. Hold pepcid for now given renal fxn 10. HTN: Monitor BP TID--on nimotop thru 12/05. Continue Lopressor.      12/4 bp fairly controlled with lopressor 25mg  bid 11. Left moderate pleural effusion/ now with LLL pneumonia. 12/6 -cefipime started 12/4--appreciate pharmacy assist.      -WBC's 8k     -added mucinex for cough   12/7 CXR without much change. I see a definite area of consolidation/increased area which wasn't there in October   12/11 -continue abx thru second dose today     12. Dysphagia -D3 diet per SLP  -needs to be awake enough to eat 13. Seizure Hx: Keppra resumed as he was on this previously 18. Hx AAA, control BP  15. Orthostatic hypotension    bp's better with reduced lopressor--   LOS: 21 days A FACE TO [place] 05/07/2022, 10:41 AM

## 2022-05-07 NOTE — Progress Notes (Signed)
Physical Therapy Session Note  Patient Details  Name: Eugene [name] MRN: [id number] Date of Birth: 1936-03-06  Today's Date: 05/08/2022 PT Individual Time: 1130-1200 PT Individual Time Calculation (min): 30 min   Short Term Goals: Week 3:  PT Short Term Goal 1 (Week 3): STG=LTG due to ELOS.  Skilled Therapeutic Interventions/Progress Updates:     Session 1: Patient in bed upon PT arrival. Patient alert and agreeable to PT session. Patient reported "mild" L hip pain this morning. Declined OOB mobility, agreeable to L hip AROM for reduced pain and stiffness.  Performed AAROM for hip/knee flexion extension 2x5 and hip abd/add 2x5. Performed soft tissue mobilization to abductors and hip flexors between ROM exercises.   Patient requested to rest and agreeable to completing session later this morning.   Patient in bed with eyes closed at end of session with breaks locked, bed alarm set, and all needs in reach.   Session 2: Patient in bed reaching for the bed rail to sit up upon PT arrival. Patient alert requested to use the bathroom, reported urgency. PT provided patient with the urinal due to urgency and he was continent of bladder at beginning of session. Performed peri-care and lower body clothing management with total A.   Patient reported 4-8/10 L hip pain during session, RN made aware. PT provided repositioning, rest breaks, and distraction as pain interventions throughout session.   Spent increased time encouraging patient to sit EOB and OOB. Educated on benefits of sitting OOB and mobility. Patient pleasant, but unmotivated with expressive aphasia. Oriented to self and location, unclear if oriented to situation due to language of confusion/aphasia, and disoriented to time. Used calendar to orient patient following.   Patient agreeable to sit EOB, cam EOB with mod A for trunk ad lower extremity management. Patient drank 100% of a nectar thick orange juice sitting EOB. Returned to  lying spontaneously once orange jjuice finished.. Laid down diagonal across the bed. Required total A for brining legs onto the bed and +2 assist for scooting up in the bed.   Patient declined further interventions at this time due to fatigue.   Patient required increased time for initiation, cuing, rest breaks, and for completion of tasks throughout session. Utilized therapeutic use of self throughout to promote efficiency.  Patient in bed asleep at end of session with breaks locked, bed alarm set, and all needs within reach.   Therapy Documentation Precautions:  Precautions Precautions: Fall Restrictions Weight Bearing Restrictions: No   Therapy/Group: Individual Therapy  Maycie Luera L Jenan Ellegood PT, DPT, NCS, CBIS  05/08/2022, 9:27 PM

## 2022-05-08 ENCOUNTER — Inpatient Hospital Stay (HOSPITAL_COMMUNITY): Payer: Medicare Other

## 2022-05-08 NOTE — Plan of Care (Signed)
  Problem: RH Swallowing Goal: LTG Patient will consume least restrictive diet using compensatory strategies with assistance (SLP) Description: LTG:  Patient will consume least restrictive diet using compensatory strategies with assistance (SLP) Flowsheets (Taken 05/08/2022 0627) LTG: Pt Patient will consume least restrictive diet using compensatory strategies with assistance of (SLP): Minimal Assistance - Patient > 75% Note: Goal downgraded due to slow progress    Problem: RH Cognition - SLP Goal: RH LTG Patient will demonstrate orientation with cues Description:  LTG:  Patient will demonstrate orientation to person/place/time/situation with cues (SLP)   Flowsheets (Taken 05/08/2022 0627) LTG: Patient will demonstrate orientation using cueing (SLP): Moderate Assistance - Patient 50 - 74% Note: Goal downgraded due to slow progress    Problem: RH Expression Communication Goal: LTG Patient will verbally express basic/complex needs(SLP) Description: LTG:  Patient will verbally express basic/complex needs, wants or ideas with cues  (SLP) Flowsheets (Taken 05/08/2022 0627) LTG: Patient will verbally express basic/complex needs, wants or ideas (SLP): Moderate Assistance - Patient 50 - 74% Note: Goal downgraded due to slow progress  Goal: LTG Patient will increase word finding of common (SLP) Description: LTG:  Patient will increase word finding of common objects/daily info/abstract thoughts with cues using compensatory strategies (SLP). Flowsheets (Taken 05/08/2022 0627) LTG: Patient will increase word finding of common (SLP): Moderate Assistance - Patient 50 - 74% Patient will use compensatory strategies to increase word finding of: Common objects Note: Goal downgraded due to slow progress    Problem: RH Memory Goal: LTG Patient will use memory compensatory aids to (SLP) Description: LTG:  Patient will use memory compensatory aids to recall biographical/new, daily complex information with  cues (SLP) Flowsheets (Taken 05/08/2022 0627) LTG: Patient will use memory compensatory aids to (SLP): Maximal Assistance - Patient 25 - 49% Note: Goal downgraded due to slow progress    Problem: RH Attention Goal: LTG Patient will demonstrate this level of attention during functional activites (SLP) Description: LTG:  Patient will will demonstrate this level of attention during functional activites (SLP) Flowsheets (Taken 05/08/2022 0627) LTG: Patient will demonstrate this level of attention during cognitive/linguistic activities with assistance of (SLP): Moderate Assistance - Patient 50 - 74% Number of minutes patient will demonstrate attention during cognitive/linguistic activities: 15 minutes Note: Goal downgraded due to slow progress    Problem: RH Awareness Goal: LTG: Patient will demonstrate awareness during functional activites type of (SLP) Description: LTG: Patient will demonstrate awareness during functional activites type of (SLP) Flowsheets (Taken 05/08/2022 0627) LTG: Patient will demonstrate awareness during cognitive/linguistic activities with assistance of (SLP): Maximal Assistance - Patient 25 - 49% Note: Goal downgraded due to slow progress

## 2022-05-08 NOTE — Progress Notes (Signed)
PROGRESS NOTE   Subjective/Complaints:  Pt in bed. C/o sl cough. Had a better night per nursing. He's alert, appears comfortable  ROS: Limited due to cognitive/behavioral   Objective:   No results found. Recent Labs    05/07/22 0609  WBC 7.2  HGB 12.6*  HCT 39.4  PLT 185      Recent Labs    05/07/22 0609  NA 136  K 4.0  CL 112*  CO2 17*  GLUCOSE 87  BUN 28*  CREATININE 1.20  CALCIUM 8.5*      Intake/Output Summary (Last 24 hours) at 05/08/2022 0944 Last data filed at 05/08/2022 6568 Gross per 24 hour  Intake 458 ml  Output --  Net 458 ml          Physical Exam: Vital Signs Blood pressure 123/66, pulse 67, temperature 97.8 F (36.6 C), temperature source Oral, resp. rate 18, height 6' (1.829 m), weight 79.4 kg, SpO2 99 %.     Constitutional: No distress . Vital signs reviewed. HEENT: NCAT, EOMI, oral membranes moist Neck: supple Cardiovascular: RRR without murmur. No JVD    Respiratory/Chest: CTA Bilaterally without wheezes or rales. Normal effort. Occ rhonchi   GI/Abdomen: BS +, non-tender, non-distended Ext: no clubbing, cyanosis, or edema Psych: pleasant and cooperative  Skin: Clean and intact without signs of breakdown Neuro:  Pt oriented to self and [place]. Speech dysarthric but clearer. Expressive language deficits. Limited insight and awareness.  Musculoskeletal: left shoulder and hip still tender but more tolerant of PROM   Assessment/Plan: 1. Functional deficits which require 3+ hours per day of interdisciplinary therapy in a comprehensive inpatient rehab setting. Physiatrist is providing close team supervision and 24 hour management of active medical problems listed below. Physiatrist and rehab team continue to assess barriers to discharge/monitor patient progress toward functional and medical goals  Care Tool:  Bathing    Body parts bathed by patient: Chest, Abdomen, Front  perineal area, Right upper leg, Left upper leg, Face, Left arm, Buttocks   Body parts bathed by helper: Right arm, Right lower leg, Left lower leg     Bathing assist Assist Level: Minimal Assistance - Patient > 75%     Upper Body Dressing/Undressing Upper body dressing   What is the patient wearing?: Pull over shirt    Upper body assist Assist Level: Supervision/Verbal cueing    Lower Body Dressing/Undressing Lower body dressing      What is the patient wearing?: Pants     Lower body assist Assist for lower body dressing: Moderate Assistance - Patient 50 - 74%     Toileting Toileting    Toileting assist Assist for toileting: Contact Guard/Touching assist     Transfers Chair/bed transfer  Transfers assist  Chair/bed transfer activity did not occur: Safety/medical concerns  Chair/bed transfer assist level: Contact Guard/Touching assist Chair/bed transfer assistive device: Geologist, engineering   Ambulation assist      Assist level: Contact Guard/Touching assist Assistive device: Walker-rolling Max distance: 5   Walk 10 feet activity   Assist  Walk 10 feet activity did not occur: Safety/medical concerns (decreased activity tolerance)  Assist level: Contact Guard/Touching assist Assistive device:  Walker-rolling   Walk 50 feet activity   Assist Walk 50 feet with 2 turns activity did not occur: Safety/medical concerns  Assist level: Contact Guard/Touching assist Assistive device: Walker-rolling    Walk 150 feet activity   Assist Walk 150 feet activity did not occur: Safety/medical concerns  Assist level: Contact Guard/Touching assist Assistive device: Walker-rolling    Walk 10 feet on uneven surface  activity   Assist Walk 10 feet on uneven surfaces activity did not occur: Safety/medical concerns         Wheelchair     Assist Is the patient using a wheelchair?: Yes Type of Wheelchair: Manual    Wheelchair assist  level: Dependent - Patient 0% Max wheelchair distance: >150 ft    Wheelchair 50 feet with 2 turns activity    Assist        Assist Level: Dependent - Patient 0%   Wheelchair 150 feet activity     Assist      Assist Level: Dependent - Patient 0%   Blood pressure 123/66, pulse 67, temperature 97.8 F (36.6 C), temperature source Oral, resp. rate 18, height 6' (1.829 m), weight 79.4 kg, SpO2 99 %.  Medical Problem List and Plan: 1. Functional deficits secondary to Left temporal contusion             -patient may  shower             -ELOS/Goals: later this week potentially   -Continue CIR therapies including PT, OT, and SLP. Interdisciplinary team conference today to discuss goals, barriers to discharge, and dc planning.  Therapy adjusted to QD    -CT with expected evolution of bleeds   2.  Antithrombotics: -DVT/anticoagulation:  Mechanical: Sequential compression devices, below knee Bilateral lower extremities 11.23- Eliquis restarted after head CT- per NSU             -antiplatelet therapy: N/a 3. Pain Management: left shoulder and hip pain are chronic.    -kpad  -scheduled tylenol  -pt refusing voltaren gel.  -Some improvement in left shoulder after injection 12/2  -IR injected left hip with perceived benefit 4. Mood/Behavior/Sleep: LCSW to follow for evaluation and support.             --Continue melatonin with Trazodone prn for insomnia.             -antipsychotic agents:    Seroquel  -12/8 continued confusion despite rx of pneumonia, adjustment of meds   - ammonia level normal. Labs otherwise improved today   -pt has been on keppra chronically   -suspect this is sundowning in setting of TBI, pneumonia, and perhaps added reaction to recent steroid injections.     -Giving meds earlier at night. Adjusted seroquel to 100mg  at 1900 12/12 seemed to do better with the above last night 5. Neuropsych/cognition: This patient is not  capable of making decisions on his  own behalf. 6. Skin/Wound Care: Routine pressure relief measures.  to multiple wounds  -left forearm wound healing, no cellulitis there 7. Fluids/Electrolytes/Nutrition:  .   -encourage PO as appropriate -BUN/CR sl improved 12/5  -12/11 BUN/Cr normalizing--will continue IVF at [facility]  -he does have CKD 8. CAF: Monitor HR TID-- .              --CT with improvement    -Eliquis restarted per NSU   -no bleeding sequelae 9. H/o GERD:  -resumed PPI. Hold pepcid for now given renal fxn 10. HTN: Monitor BP TID--on nimotop thru  12/05. Continue Lopressor.      12/4 bp fairly controlled with lopressor 25mg  bid 11. Left moderate pleural effusion/ now with LLL pneumonia. 12/6 -cefipime started 12/4--appreciate pharmacy assist.      -WBC's 8k     -added mucinex for cough   12/7 CXR without much change. I see a definite area of consolidation/increased area which wasn't there in October   12/11 -continue abx thru second dose today    12/12 recheck cxr today 12. Dysphagia -D3 diet per SLP  -needs to be awake enough to eat 13. Seizure Hx: Keppra resumed as he was on this previously 54. Hx AAA, control BP  15. Orthostatic hypotension    bp's better with reduced lopressor--   LOS: 22 days A FACE TO [place] 05/08/2022, 9:44 AM

## 2022-05-08 NOTE — Progress Notes (Signed)
Patient ID: Eugene [name], male   DOB: [birth year], 86 y.o.   MRN: [id number]  1207- SW called pt dtr Eugene [name] to provide updates from team conference, and d/c date now 12/14. Pt dtr would like assistance with purchasing nectar thickener. SW shared will ask SLP to follow-up to discuss further. No other questions/concerns reported.   SW sent HHPT/OT/SLP order to [facility] and informed on discharge date.    Cecile Sheerer, MSW, LCSWA Office: [phone] Cell: [phone] Fax: [phone]

## 2022-05-08 NOTE — Progress Notes (Addendum)
Writer attempted to administer medication to patient. Due to dysphagia medication has to be crushed. Day prior patient took medication in pudding without any issues. However, this evening patient speech dysarthric and at times incomprehensible. Demonstrating frustration during this interaction. Is alert and oriented to name. Patient asking about making a phone call offered to assist patient with making phone in attempt to provide therapeutic solution to current situation. Writer attempting to interact with patient at eye level and explain about medication being administered. During this time patient took the medication in pudding cup and threw towards the wall spilling the medication on the floor. Also, refusing scheduled continuous IV fluids this evening. Charge nurse made aware. Press photographer, Psychologist, sport and exercise, and Clinical research associate cleaned patient up and repositioned him in bed to assist in support self-soothing actions for him. Contacted on call provider and updated about situation medications that patient refused this evening. Will continue to monitor situation.  Tula Nakayama, LPN

## 2022-05-08 NOTE — Patient Care Conference (Signed)
Inpatient RehabilitationTeam Conference and Plan of Care Update Date: 05/08/2022   Time: 10:04 AM    Patient Name: Evette CristalJames A Ferrentino      Medical Record Number: [id number]  Date of Birth: 1935/07/08 Sex: Male         Room/Bed: 4M03C/4M03C-01 Payor Info: Payor: MEDICARE / Plan: MEDICARE PART A AND B / Product Type: *No Product type* /    Admit Date/Time:  04/16/2022  4:41 PM  Primary Diagnosis:  TBI (traumatic brain injury) [facility](HCC)  Hospital Problems: Principal Problem:   TBI (traumatic brain injury) (HCC) Active Problems:   Shoulder pain   Acute kidney injury superimposed on chronic kidney disease (HCC)   Mood disorder (HCC)   Malnutrition of moderate degree   Subdural hematoma (HCC)   Sleep-wake disorder   Delirium, drug-induced    Expected Discharge Date: Expected Discharge Date: 05/10/22  Team Members Present: Physician leading conference: Dr. Faith RogueZachary Swartz Social Worker Present: Cecile SheererAuria Chamberlain, LCSWA Nurse Present: Chana Bodeeborah Meilech Virts, RN PT Present: Serina Cowperherie Grunenberg, PT OT Present: Blanch MediaStephanie Schlosser, OT SLP Present: Feliberto Gottronourtney Payne, SLP PPS Coordinator present : Fae PippinMelissa Bowie, SLP     Current Status/Progress Goal Weekly Team Focus  Bowel/Bladder   Incontinent with bowel. Some continence of bladder.   Return to full continence with B & B   Continue to assist with tolieting needs. Continue to preventive measures for skin breakdown due to incontinence.    Swallow/Nutrition/ Hydration   Dys. 3 textures with nectar-thick liquids, Min-Mod A for use of swallowing compensatory strategies   Goald downgraded to Min A  tolerance of current diet, use of swallowing strategies    ADL's   can be CGA-MIN A for mobility, declines participation frequently, confusion/agitation/poor frustration, improved sleep/wake cyle   CGA-MIN A for mobility, will downgrade to MOD-MAX A for BADLs   participation, endurance, cognition, balance, caregiver educaiton    Mobility   CGA-min A  overall if participatory   CGA-min A overall  Activity tolerance, balance, functional mobility, gait training, patient caregiver education as needed    Communication   Mod-Max A   Goal downgraded to Mod A   word-finding, expression of wants/needs    Safety/Cognition/ Behavioral Observations  Mod-Max A   Goals downgraded to Mod A   orientation, awareness, allowing care    Pain   Continue schedule medications to reduce pain level. Pain area left shoulder and left hip.   Continue to work towards pain <3/10   Montior effectiveness of pain medication and treatment for pain. Encourage patient to improve verbalization to better treat pain.    Skin   Impaired skin integrity to buttock & sacrum. Skin tears to left hand and left anterior lower leg. Abrasion to right lower leg.   Continue to prevent infection. Continue to prevent further breakdown of skin integrity.  Assess Qshift and PRN.      Discharge Planning:  Pt remains on medical hold at this time. Pt to d/c to home with 24/7 hired caregivers and PRN support from pt children. [facility] Fam edu completed on 12/4 9am-12pm with pt children. SW will confirm no barriers to discharge.   Team Discussion: Patient is more awake today; still exhibiting language of confusion and verbal agitation with repetitive cues. Remains incontinent with toileting and MASD addressed. Pna treated; follow up CXR pending.  Patient on target to meet rehab goals: yes, when motivated and cooperative; patient is able to complete ADLs with max - total assist. Able to  complete mobility activities with min - CGA and ambulate up to 200' with min - CGA. Continue to D3 Nectar thickened liquids.   *See Care Plan and progress notes for long and short-term goals.   Revisions to Treatment Plan:  Telesitter  IVF at [facility]; MD to check labs, plan to d/c IVF   Teaching Needs: Safety, medications, dietary modifications, transfers,  toileting, etc  Current Barriers to Discharge: Decreased caregiver support, Home enviroment access/layout, and Behavior  Possible Resolutions to Barriers: SNF recommended for access to additional resources Family education completed 04/30/22 Private duty sitters/24/7 caregivers round the clock DME: hospital bed, BSC, alarm mat, RW HH follow up services     Medical Summary Current Status: slept better last night. changed sleep meds/ meds for sundowning. pneumonia has been treated  Barriers to Discharge: Medical stability   Possible Resolutions to [employer] Focus: daily assessment of nutrition, sleep and pt data with adjustments to regimen as needed   Continued Need for Acute Rehabilitation Level of Care: The patient requires daily medical management by a physician with specialized training in physical medicine and rehabilitation for the following reasons: Direction of a multidisciplinary physical rehabilitation program to maximize functional independence : Yes Medical management of patient stability for increased activity during participation in an intensive rehabilitation regime.: Yes Analysis of laboratory values and/or radiology reports with any subsequent need for medication adjustment and/or medical intervention. : Yes   I attest that I was present, lead the team conference, and concur with the assessment and plan of the team.   Chana Bode B 05/08/2022, 10:52 AM

## 2022-05-08 NOTE — Progress Notes (Signed)
Speech Language Pathology Daily Session Note  Patient Details  Name: Eugene [name] MRN: [id number] Date of Birth: November 06, 1935  Today's Date: 05/08/2022 SLP Individual Time: 1450-1532 SLP Individual Time Calculation (min): 42 min  Short Term Goals: Week 3: SLP Short Term Goal 1 (Week 3): STGs=LTGs due to ELOS  Skilled Therapeutic Interventions: Skilled ST treatment focused on cognitive-linguistic goals. Pt was greeted in bed on arrival and accompanied by SIL and hired caregiver from prior to hospitalization. Pt exhibited language of confusion throughout session. He exhibited some frustrated moments regarding chosen activities however remained mostly calm and cooperative throughout session. SLP facilitated orientation by providing max A multimodal cues for use of external aids to orient to location, situation, month, and year. Pt was independently aware of DOW. SLP facilitated a simple divergent naming task given max A multimodal cues and maximal redirection for focused attention. SLP then facilitated a card sorting task with steady max A multimodal cues to sort cards by color with field of 4. Level of support decreased to mod A when given only field of 2. Pt required consistent verbal encouragement and coaxing to persist to today's tasks. Patient was left in bed with alarm activated and immediate needs within reach at end of session. Continue per current plan of care.      Pain  None/denied  Therapy/Group: Individual Therapy  Tamala Ser 05/08/2022, 5:24 PM

## 2022-05-08 NOTE — Progress Notes (Signed)
Occupational Therapy TBI Note  Patient Details  Name: Eugene [name] MRN: [id number] Date of Birth: [birth year]  Today's Date: 05/08/2022 OT Individual Time: 3143-8887 OT Individual Time Calculation (min): 24 min    Short Term Goals: Week 1:  OT Short Term Goal 1 (Week 1): Pt will complete 2/3 steps of donning pants OT Short Term Goal 1 - Progress (Week 1): Progressing toward goal OT Short Term Goal 2 (Week 1): Pt will complete toilet transfer with LRAD at CGA level OT Short Term Goal 2 - Progress (Week 1): Met OT Short Term Goal 3 (Week 1): Pt will don shirt EOB with no A for sitting balance OT Short Term Goal 3 - Progress (Week 1): Met OT Short Term Goal 4 (Week 1): Pt will bathe with MIN A at sit to stand level OT Short Term Goal 4 - Progress (Week 1): Progressing toward goal  Skilled Therapeutic Interventions/Progress Updates:     Pt received in bed with no pain reported. ADL: Pt completes self feeding in semi fowlers position >45* HOB with min cuing for swallowing stratiegies as outlined in SLP plan while consuming dys 3 textures and nectar liqiuids. no overt s/s of aspirtaion. When OT takes tray out of room, pt with provale cup on tray table and drinks thin water immediately coughing/aspirating. Unsure why provale cup in room as states on SLP sign no provale cup. Confirmed with SLP, and removed cup from room. Place large sign that says no thin liquids at [facility] and communicated with RN/NT.spent ~10 min making conversation/educating pt on getting OOB to improve activity tolerance to get home, but pt continues to decline. Pt with improved arousal than end of last week but still demo confusion as well as language impairments impacting understanding/expression. Pt missed 21 min skilled OT d/t refusal to participate.  Pt left at end of session in bed with exit alarm on, call light in reach and all needs met   Therapy Documentation Precautions:  Precautions Precautions:  Fall Restrictions Weight Bearing Restrictions: No General:   Vital Signs: Therapy Vitals Pulse Rate: 67 Resp: 18 BP: 123/66 Patient Position (if appropriate): Lying Oxygen Therapy SpO2: 99 % O2 Device: Room Air Pain:   Agitated Behavior Scale: TBI ABS discontinued d/t ABS score less than 20 for the last three days or no behaviors present       Therapy/Group: Individual Therapy  [name] 05/08/2022, 6:25 AM

## 2022-05-09 LAB — URINALYSIS, COMPLETE (UACMP) WITH MICROSCOPIC
Bilirubin Urine: NEGATIVE
Glucose, UA: NEGATIVE mg/dL
Ketones, ur: NEGATIVE mg/dL
Leukocytes,Ua: NEGATIVE
Nitrite: NEGATIVE
Protein, ur: 30 mg/dL — AB
Specific Gravity, Urine: 1.013 (ref 1.005–1.030)
pH: 6 (ref 5.0–8.0)

## 2022-05-09 MED ORDER — ZINC SULFATE 220 (50 ZN) MG PO CAPS: 220.0000 mg | ORAL_CAPSULE | Freq: Every day | ORAL | 0 refills | Status: AC

## 2022-05-09 MED ORDER — METOPROLOL TARTRATE 25 MG PO TABS: 25.0000 mg | ORAL_TABLET | Freq: Two times a day (BID) | ORAL | 0 refills | Status: AC

## 2022-05-09 MED ORDER — QUETIAPINE FUMARATE 100 MG PO TABS: 100.0000 mg | ORAL_TABLET | Freq: Every day | ORAL | 0 refills | Status: AC

## 2022-05-09 MED ORDER — TAMSULOSIN HCL 0.4 MG PO CAPS: 0.4000 mg | ORAL_CAPSULE | Freq: Every day | ORAL | 0 refills | Status: AC

## 2022-05-09 MED ORDER — SIMPLYTHICK EASY MIX PO GEL
1.0000 | Freq: Four times a day (QID) | ORAL | 0 refills | Status: AC
  Filled 2022-05-09: qty 200, fill #0
  Filled 2022-05-10: qty 200, 50d supply, fill #0

## 2022-05-09 MED ORDER — APIXABAN 5 MG PO TABS: 5.0000 mg | ORAL_TABLET | Freq: Two times a day (BID) | ORAL | 0 refills | Status: AC

## 2022-05-09 NOTE — Progress Notes (Signed)
Patient in chair upon arrival to the unit. Calm and cooperative. Nurse tech assisting patient to bed this evening and for daily bath. Peri care and daily hygiene performed. During that time Clinical research associate greeted patient. Explained to him the plan for the evening will be back with his medication following the completion of his bath. During this interaction was calm but not wanting to interact with Clinical research associate. Came to the room few minutes before 2000. Attempted three times to administer scheduled medication for patient. Explained to him the importance of medication scheduled this evening. Tried to identify any barriers to not taking the medication. Patient remained calm and cooperative during the interaction. Put music on the TV for patient in hopes of patient being more receptive to taking the medication. Able to hang new IV bag for continues fluids scheduled. Did allow writer to scan his armband. Contacted the on call provider and updated them about the current situation. Will continue to monitor situation.

## 2022-05-09 NOTE — Progress Notes (Signed)
Inpatient Rehabilitation Discharge Medication Review by a Pharmacist  A complete drug regimen review was completed for this patient to identify any potential clinically significant medication issues.  High Risk Drug Classes Is patient taking? Indication by Medication  Antipsychotic Yes Quetiapine - mood  Anticoagulant Yes Apixaban - Afib  Antibiotic No   Opioid No   Antiplatelet No   Hypoglycemics/insulin No   Vasoactive Medication Yes Tamsulosin -BPH Metoprolol - BP  Chemotherapy No   Other Yes Scopolamine patch-secretions  Omeprazole - Reflux Levetiracetam - seizures ppx     Type of Medication Issue Identified Description of Issue Recommendation(s)  Drug Interaction(s) (clinically significant)     Duplicate Therapy     Allergy     No Medication Administration End Date     Incorrect Dose     Additional Drug Therapy Needed     Significant med changes from prior encounter (inform family/care partners about these prior to discharge).    Other       Clinically significant medication issues were identified that warrant physician communication and completion of prescribed/recommended actions by midnight of the next day:  No  Pharmacist comments: None  Time spent performing this drug regimen review (minutes):  30 minutes  Thank you. Okey Regal, PharmD

## 2022-05-09 NOTE — Progress Notes (Addendum)
Writer in room three times from time period of 0000 to 0015. Patient legs hanging out of the bed. Expressed "stretching his legs". With nurse tech returned back to room due to patient's brief being wet and explained to patient having to change brief due to ongoing issues of skin breakdown. During this time patient had to be verbally redirected as he attempted to kick at nurse tech. Attempting to hit staff and grab staff arms while changing him. Making derogatory statements towards staff. Making statements of paranoia about contacting the [facility] Disoriented at this time needing to be redirected that he is not at his house and in the hospital. Utilizing different staff members as well to aide in verbally redirecting him and calming him down. Able to be changed and repositioned in bed. At this time behavior is demonstrating an improvement. Nurse tech mentions that urine has a foul odor to it.  Tula Nakayama, LPN

## 2022-05-09 NOTE — Progress Notes (Signed)
PROGRESS NOTE   Subjective/Complaints:  Restless period again last night. Refused seroquel and ultimately was not given. Effects of that were evident. NT mentioned that urine had odor. Patient in bed with two NT's helping him clean up. Pt said he was in [place] this morning   ROS: Limited due to cognitive/behavioral   Objective:   DG CHEST PORT 1 VIEW  Result Date: 05/08/2022 CLINICAL DATA:  Cough EXAM: PORTABLE CHEST 1 VIEW COMPARISON:  05/03/22 CXR FINDINGS: Redemonstrated left-sided pleural effusion. No pneumothorax. No new focal airspace opacity. No displaced rib fractures. Unchanged cardiac contours. Redemonstrated extensive calcifications of the thoracic aorta. IMPRESSION: Redemonstrated left-sided pleural effusion. No new focal airspace opacity. Electronically Signed   By: Lorenza Cambridge M.D.   On: 05/08/2022 13:33   Recent Labs    05/07/22 0609  WBC 7.2  HGB 12.6*  HCT 39.4  PLT 185      Recent Labs    05/07/22 0609  NA 136  K 4.0  CL 112*  CO2 17*  GLUCOSE 87  BUN 28*  CREATININE 1.20  CALCIUM 8.5*      Intake/Output Summary (Last 24 hours) at 05/09/2022 0813 Last data filed at 05/09/2022 0800 Gross per 24 hour  Intake 554 ml  Output --  Net 554 ml          Physical Exam: Vital Signs Blood pressure 133/84, pulse 90, temperature 98 F (36.7 C), temperature source Oral, resp. rate 17, height 6' (1.829 m), weight 77.7 kg, SpO2 99 %.     Constitutional: No distress . Vital signs reviewed. HEENT: NCAT, EOMI, oral membranes moist Neck: supple Cardiovascular: RRR without murmur. No JVD    Respiratory/Chest: CTA Bilaterally without wheezes or rales. Normal effort    GI/Abdomen: BS +, non-tender, non-distended Ext: no clubbing, cyanosis, or edema Psych: confused, non-agitated Skin: Clean and intact without signs of breakdown Neuro:  Pt oriented to self. Follows simple commands.  Speech  dysarthric but clearer. Expressive language deficits. Limited insight and awareness.  Musculoskeletal: left shoulder and hip still tender but more tolerant of PROM   Assessment/Plan: 1. Functional deficits which require 3+ hours per day of interdisciplinary therapy in a comprehensive inpatient rehab setting. Physiatrist is providing close team supervision and 24 hour management of active medical problems listed below. Physiatrist and rehab team continue to assess barriers to discharge/monitor patient progress toward functional and medical goals  Care Tool:  Bathing    Body parts bathed by patient: Chest, Abdomen, Front perineal area, Right upper leg, Left upper leg, Face, Left arm, Buttocks   Body parts bathed by helper: Right arm, Right lower leg, Left lower leg     Bathing assist Assist Level: Minimal Assistance - Patient > 75%     Upper Body Dressing/Undressing Upper body dressing   What is the patient wearing?: Pull over shirt    Upper body assist Assist Level: Supervision/Verbal cueing    Lower Body Dressing/Undressing Lower body dressing      What is the patient wearing?: Pants     Lower body assist Assist for lower body dressing: Moderate Assistance - Patient 50 - 74%     Toileting Toileting  Toileting assist Assist for toileting: Contact Guard/Touching assist     Transfers Chair/bed transfer  Transfers assist  Chair/bed transfer activity did not occur: Safety/medical concerns  Chair/bed transfer assist level: Contact Guard/Touching assist Chair/bed transfer assistive device: Geologist, engineering   Ambulation assist      Assist level: Contact Guard/Touching assist Assistive device: Walker-rolling Max distance: 5   Walk 10 feet activity   Assist  Walk 10 feet activity did not occur: Safety/medical concerns (decreased activity tolerance)  Assist level: Contact Guard/Touching assist Assistive device: Walker-rolling   Walk 50 feet  activity   Assist Walk 50 feet with 2 turns activity did not occur: Safety/medical concerns  Assist level: Contact Guard/Touching assist Assistive device: Walker-rolling    Walk 150 feet activity   Assist Walk 150 feet activity did not occur: Safety/medical concerns  Assist level: Contact Guard/Touching assist Assistive device: Walker-rolling    Walk 10 feet on uneven surface  activity   Assist Walk 10 feet on uneven surfaces activity did not occur: Safety/medical concerns         Wheelchair     Assist Is the patient using a wheelchair?: Yes Type of Wheelchair: Manual    Wheelchair assist level: Dependent - Patient 0% Max wheelchair distance: >150 ft    Wheelchair 50 feet with 2 turns activity    Assist        Assist Level: Dependent - Patient 0%   Wheelchair 150 feet activity     Assist      Assist Level: Dependent - Patient 0%   Blood pressure 133/84, pulse 90, temperature 98 F (36.7 C), temperature source Oral, resp. rate 17, height 6' (1.829 m), weight 77.7 kg, SpO2 99 %.  Medical Problem List and Plan: 1. Functional deficits secondary to Left temporal contusion             -patient may  shower             -ELOS/Goals: potentially Thursday or Friday. He will need a lot of help at home.   -Continue CIR therapies including PT, OT, and SLP   Therapy adjusted to QD    -CT with expected evolution of bleeds   2.  Antithrombotics: -DVT/anticoagulation:  Mechanical: Sequential compression devices, below knee Bilateral lower extremities 11.23- Eliquis restarted after head CT- per NSU             -antiplatelet therapy: N/a 3. Pain Management: left shoulder and hip pain are chronic.    -kpad  -scheduled tylenol  -pt refusing voltaren gel.  -Some improvement in left shoulder after injection 12/2  -IR injected left hip with perceived benefit 4. Mood/Behavior/Sleep: LCSW to follow for evaluation and support.             --Continue melatonin  with Trazodone prn for insomnia.             -antipsychotic agents:    Seroquel  -12/8 continued confusion despite rx of pneumonia, adjustment of meds   - ammonia level normal. Labs otherwise improved today   -pt has been on keppra chronically   -suspect this is sundowning in setting of TBI, pneumonia, and perhaps added reaction to recent steroid injections.     -Giving meds earlier at night. Adjusted seroquel to 100mg  at 1900 12/13 missed seroquel last night---imperative that he gets this to help him rest and settle down in the evening.   -ordered UA, UCX given urine odor 5. Neuropsych/cognition: This patient is not  capable of making decisions on his own behalf. 6. Skin/Wound Care: Routine pressure relief measures.  to multiple wounds  -left forearm wound healing, no cellulitis there 7. Fluids/Electrolytes/Nutrition:  .   -encourage PO as appropriate -BUN/CR sl improved 12/5  -12/11 BUN/Cr normalizing--will continue IVF at [facility]  -he does have CKD 8. CAF: Monitor HR TID-- .              --CT with improvement    -Eliquis restarted per NSU   -no bleeding sequelae 9. H/o GERD:  -resumed PPI. Hold pepcid for now given renal fxn 10. HTN: Monitor BP TID--on nimotop thru 12/05. Continue Lopressor.      12/4 bp fairly controlled with lopressor 25mg  bid 11. Left moderate pleural effusion/ now with LLL pneumonia. 12/6 -cefipime started 12/4--appreciate pharmacy assist.      -WBC's 8k     -added mucinex for cough   12/7 CXR without much change. I see a definite area of consolidation/increased area which wasn't there in October   12/11 -continue abx thru second dose today    12/13 f/u cxr demonstrated some interval improvement 12. Dysphagia -D3 diet per SLP  -needs to be awake enough to eat 13. Seizure Hx: Keppra resumed as he was on this previously 14. Hx AAA, control BP  15. Orthostatic hypotension    bp's better with reduced lopressor--   LOS: 23 days A FACE TO FACE EVALUATION WAS  PERFORMED  1/14 05/09/2022, 8:13 AM

## 2022-05-09 NOTE — Progress Notes (Signed)
Occupational Therapy Weekly Progress Note  Patient Details  Name: Eugene [name] MRN: [id number] Date of Birth: 09/21/1935  Beginning of progress report period: May 01, 2022 End of progress report period: May 09, 2022  Today's Date: 05/09/2022 OT Individual Time: 0397-9536 OT Individual Time Calculation (min): 45 min    STGs not set d/t pt medical status/ELOS. Pt is currently at functional mobility level for LTGS, however still needing increased A with dressing/toileting/bathing goals. Pt has made minimal to no progress this week in participation, however pt has demonstrated improved arousal and sleep/wake cycle. Pt continues to still demo difficlty with orientation, is often confused and refusing to participate. Pt has been downgraded to a QD schedule d/t refusals to participate but despite many opportunities to get OOB, work on ADLs, and mobilize pt remains adamant about not getting up. When pt does mobilize he is MIN A-CGA, if pt is not intrinsicly motivated he remains MOD-MAX A for mobility with significant posterior lean.  Patient continues to demonstrate the following deficits: muscle weakness, decreased cardiorespiratoy endurance, impaired timing and sequencing and decreased coordination, decreased visual perceptual skills, decreased midline orientation, decreased attention, decreased awareness, decreased problem solving, decreased safety awareness, decreased memory, delayed processing, and demonstrates behaviors consistent with [place], and decreased sitting balance, decreased standing balance, decreased postural control, hemiplegia, and decreased balance strategies and therefore will continue to benefit from skilled OT intervention to enhance overall performance with BADL and Reduce care partner burden.  Patient progressing toward long term goals..  Continue plan of care.  OT Short Term Goals Week 3:  OT Short Term Goal 1 (Week 3): STG=LTG d/t ELOS Week 4:  OT Short  Term Goal 1 (Week 4): STG=LTG d/t ELOS  Skilled Therapeutic Interventions/Progress Updates:    Pt received in bed with no pain agreeable to getting pants on. Pts language seems more garbled and non sensical this date, but arousal is better.  ADL: Pt completes ADL at overall CGA-min for mobility with RW at ambulatory Level but mod-max A for components of toielting and LB dressing. Skilled interventions include: threading BLE into pants as motivator to get to EOB to pull up, education on [facility] as pt reporting dizziness in standing at toilet for hygiene. Pt able to complete 75% of hygiene but requires A for thoroughness. Pt convinced dizziness is "medication" but BP in sitting is 124/82. Pt transfers into recliner with CGA and resting at end of session  Pt left at end of session in recliner with exit alarm on, call light in reach and all needs met   Therapy Documentation Precautions:  Precautions Precautions: Fall Restrictions Weight Bearing Restrictions: No ABS discontinued d/t ABS score less than 20 for the last three days or no behaviors present    Therapy/Group: Individual Therapy  [name] 05/09/2022, 6:55 AM

## 2022-05-09 NOTE — Progress Notes (Signed)
Physical Therapy Session Note  Patient Details  Name: Eugene [name] MRN: [id number] Date of Birth: 1936/02/10  Today's Date: 05/09/2022 PT Individual Time: 1425-1455 PT Individual Time Calculation (min): 30 min   Short Term Goals: Week 3:  PT Short Term Goal 1 (Week 3): STG=LTG due to ELOS. Week 4:     Skilled Therapeutic Interventions/Progress Updates:     Patient in *** upon PT arrival. Patient alert and agreeable to PT session. Patient denied pain during session.  Therapeutic Activity: Bed Mobility: Patient performed supine to/from sit with ***. Provided verbal cues for ***. Transfers: Patient performed sit to/from stand x*** with ***. Provided verbal cues for***.  Gait Training:  Patient ambulated *** feet using *** with ***. Ambulated with ***. Provided verbal cues for ***.  Wheelchair Mobility:  Patient propelled wheelchair *** feet with ***. Provided verbal cues for ***  Neuromuscular Re-ed: Patient performed the following *** activities: ***  Therapeutic Exercise: Patient performed the following exercises with verbal and tactile cues for proper technique. ***  Patient in *** at end of session with breaks locked, *** alarm set, and all needs within reach.   Therapy Documentation Precautions:  Precautions Precautions: Fall Restrictions Weight Bearing Restrictions: No    Therapy/Group: Individual Therapy  Adelaido Nicklaus L [name] PT, DPT, NCS, CBIS  05/09/2022, 5:41 PM

## 2022-05-09 NOTE — Progress Notes (Signed)
Speech Language Pathology TBI Note  Patient Details  Name: Eugene [name] MRN: [id number] Date of Birth: [birth year]  Today's Date: 05/09/2022 SLP Individual Time: 0815-0855 SLP Individual Time Calculation (min): 40 min  Short Term Goals: Week 3: SLP Short Term Goal 1 (Week 3): STGs=LTGs due to ELOS  Skilled Therapeutic Interventions: Skilled treatment session focused on cognitive-linguistic goals. Upon arrival, patient was awake while upright in bed. Patient reported fatigue due to not sleeping last night with SLP providing education on the importance of staying awake during the day to help with ease of sleep at night. SLP facilitated session by providing Mod-Max A verbal and visual cues for use of external aids for orientation to place, time and situation. Patient with improved verbal expression today with "islands" of coherent and intelligible language. However, phonemic paraphasias and neologisms increased as session progressed. SLP provided cues for  a slow rate and overall Max-Total A to self-monitor and correct errors at the phrase level. Overall, patient appeared more alert and engaged but continues to demonstrate confusion. Patient left upright in bed with alarm on and all needs within reach. Continue with current plan of care.      Pain  No/Denies Pain   Agitated Behavior Scale: TBI Observation Details Observation Environment: CIR Start of observation period - Date: 05/09/22 Start of observation period - Time: 0815 End of observation period - Date: 05/09/22 End of observation period - Time: 0900 Agitated Behavior Scale (DO NOT LEAVE BLANKS) Short attention span, easy distractibility, inability to concentrate: Present to a slight degree Impulsive, impatient, low tolerance for pain or frustration: Absent Uncooperative, resistant to care, demanding: Present to a slight degree Violent and/or threatening violence toward people or property: Absent Explosive and/or unpredictable  anger: Absent Rocking, rubbing, moaning, or other self-stimulating behavior: Absent Pulling at tubes, restraints, etc.: Absent Wandering from treatment areas: Absent Restlessness, pacing, excessive movement: Absent Repetitive behaviors, motor, and/or verbal: Absent Rapid, loud, or excessive talking: Present to a slight degree Sudden changes of mood: Absent Easily initiated or excessive crying and/or laughter: Absent Self-abusiveness, physical and/or verbal: Absent Agitated behavior scale total score: 17  Therapy/Group: Individual Therapy  Milton Streicher 05/09/2022, 2:10 PM

## 2022-05-10 ENCOUNTER — Other Ambulatory Visit (HOSPITAL_COMMUNITY): Payer: Self-pay

## 2022-05-10 LAB — URINE CULTURE: Culture: NO GROWTH

## 2022-05-10 NOTE — Progress Notes (Signed)
Occupational Therapy Discharge Summary  Patient Details  Name: Eugene [name] MRN: [id number] Date of Birth: 05-Sep-1935  Date of Discharge from OT service:May 10, 2022      Patient has met 7 of 10 long term goals due to improved activity tolerance, improved balance, postural control, ability to compensate for deficits, functional use of  LEFT upper and LEFT lower extremity, improved attention, improved awareness, and improved coordination.  Patient to discharge at [facility] level.  Patient's care partner is independent to provide the necessary cognitive assistance at discharge.  Pt family has attended family education and a written handout has been provided for continuity of caregivers who will be assisting with pt.    Reasons goals not met: Pt continues to requires increased A for ADLs such as toileting hygiene after BM, increased A to don shirt and increased A for memory. Pts stay has been complicated medically by UTI and PNA, as well as behaviorally with pt refusing to participate in tx. Pts family/caregivers feel able to take home at current level of care and were provided instructions for best /safest strategies for ADLs/mobility.    Recommendation:  Patient will benefit from ongoing skilled OT services in home health setting to continue to advance functional skills in the area of BADL and Reduce care partner burden.   Equipment: No equipment provided   Reasons for discharge: treatment goals met and discharge from hospital   Patient/family agrees with progress made and goals achieved: Yes   OT Discharge Precautions/Restrictions  Precautions Precautions: Fall Restrictions Weight Bearing Restrictions: No   ADL ADL Eating: Supervision/safety Where Assessed-Eating: Chair Grooming: Supervision/safety Where Assessed-Grooming: Edge of bed Upper Body Bathing: Supervision/safety Where Assessed-Upper Body Bathing: Edge of bed Lower Body Bathing: Moderate  assistance Where Assessed-Lower Body Bathing: Edge of bed Upper Body Dressing: Supervision/safety Where Assessed-Upper Body Dressing: Edge of bed Lower Body Dressing: Moderate assistance, Maximal assistance Where Assessed-Lower Body Dressing: Edge of bed Toileting: Moderate assistance Where Assessed-Toileting: Bedside Commode Toilet Transfer: Therapist, music Method: Stand pivot Nutritional therapist Method: Print production planner with back Vision Baseline Vision/History: 1 Wears glasses Patient Visual Report: No change from baseline Vision Assessment?: No apparent visual deficits Perception  Perception: Within Functional Limits Praxis Praxis: Impaired Praxis Impairment Details: Motor planning Cognition Cognition Overall Cognitive Status: Impaired/Different from baseline Arousal/Alertness: Awake/alert Orientation Level: Person;Place;Situation Person: Oriented Place: Oriented Situation: Oriented Memory: Impaired Decreased Short Term Memory: Verbal basic Attention: Sustained Sustained Attention: Impaired Awareness: Impaired Awareness Impairment: Emergent impairment Problem Solving: Impaired Behaviors: Poor frustration tolerance Safety/Judgment: Impaired Brief Interview for Mental Status (BIMS) Repetition of Three Words (First Attempt): 3 Temporal Orientation: Year: Missed by 2 to 5 years Temporal Orientation: Month: Missed by 6 days to 1 month Temporal Orientation: Day: Incorrect Recall: "Sock": No, could not recall Recall: "Blue": No, could not recall Recall: "Bed": No, could not recall BIMS Summary Score: 7 Sensation Sensation Light Touch: Impaired Detail Peripheral sensation comments: impaired B LE's distal to the knee (chronic per pt) Light Touch Impaired Details: Impaired RLE;Impaired LLE Hot/Cold: Not tested Proprioception: Appears Intact Stereognosis: Not  tested Coordination Gross Motor Movements are Fluid and Coordinated: No Fine Motor Movements are Fluid and Coordinated: No Coordination and Movement Description: remains deconditioned and limited by pain, motor planning with mobilty improved since eval--but remains impaired Motor  Motor Motor: Abnormal postural alignment and control;Motor apraxia Mobility  Bed Mobility Rolling Right: Supervision/verbal cueing Rolling Left: Supervision/Verbal cueing Supine  to Sit: Minimal Assistance - Patient > 75% Sit to Supine: Minimal Assistance - Patient > 75% Transfers Sit to Stand: Contact Guard/Touching assist Stand to Sit: Contact Guard/Touching assist  Trunk/Postural Assessment  Cervical Assessment Cervical Assessment: Exceptions to [facility] Thoracic Assessment Thoracic Assessment: Exceptions to [facility] Lumbar Assessment Lumbar Assessment: Exceptions to [facility] Postural Control Postural Control: Deficits on evaluation (posterior bias)  Balance Static Sitting Balance Static Sitting - Balance Support: Bilateral upper extremity supported Static Sitting - Level of Assistance: 5: Stand by assistance Dynamic Sitting Balance Dynamic Sitting - Level of Assistance: 5: Stand by assistance Static Standing Balance Static Standing - Level of Assistance: 5: Stand by assistance Dynamic Standing Balance Dynamic Standing - Level of Assistance: 4: Min assist Extremity/Trunk Assessment RUE Assessment RUE Assessment: Within Functional Limits LUE Assessment LUE Assessment: Exceptions to [facility] General Strength Comments: continues to be limited by pain- movement 30-40 degrees in all planes in shoulder, wrist and hand [facility]   [name] 05/10/2022, 2:38 PM

## 2022-05-10 NOTE — Progress Notes (Signed)
Inpatient Rehabilitation Care Coordinator Discharge Note   Patient Details  Name: Eugene [name] MRN: [id number] Date of Birth: Aug 22, 1935   Discharge location: D/c to home with aide care  Length of Stay: 23 days  Discharge activity level: Supervision  Home/community participation: Limited  Patient response HF:WYOVZC Literacy - How often do you need to have someone help you when you read instructions, pamphlets, or other written material from your doctor or pharmacy?: Never  Patient response HY:IFOYDX Isolation - How often do you feel lonely or isolated from those around you?: Patient unable to respond  Services provided included: MD, RD, PT, OT, RN, CM, SLP, TR, Pharmacy, Neuropsych, SW  Financial Services:  Field seismologist Utilized: Medicare    Choices offered to/list presented to: Yes  Follow-up services arranged:  Home Health, DME Home Health Agency: [facility] for HHPT/OT/SLP    DME : has all DME needed at home    Patient response to transportation need: Is the patient able to respond to transportation needs?: Yes In the past 12 months, has lack of transportation kept you from medical appointments or from getting medications?: No In the past 12 months, has lack of transportation kept you from meetings, work, or from getting things needed for daily living?: No   Comments (or additional information):  Patient/Family verbalized understanding of follow-up arrangements:  Yes  Individual responsible for coordination of the follow-up plan: contact pt dtr [name]  Confirmed correct DME delivered: Gretchen Short 05/10/2022    Gretchen Short

## 2022-05-10 NOTE — Plan of Care (Signed)
  Problem: RH BLADDER ELIMINATION Goal: RH STG MANAGE BLADDER WITH ASSISTANCE Description: STG Manage Bladder With toileting Assistance Outcome: Completed/Met

## 2022-05-10 NOTE — Progress Notes (Signed)
Physical Therapy Session Note  Patient Details  Name: Eugene [name] MRN: [id number] Date of Birth: 12-29-1935  Today's Date: 05/10/2022 PT Individual Time: 2694-8546 PT Individual Time Calculation (min): 45 min   Short Term Goals: Week 3:  PT Short Term Goal 1 (Week 3): STG=LTG due to ELOS.  Skilled Therapeutic Interventions/Progress Updates:     Patient in bed asleep upon PT arrival. Patient slow to arouse and initially reluctant to get OOB. Informed patient that he would be going home today and patient agreeable to sit up to get dressed.   Vitals: BP: 126/60 HR: 70 SPO2: 99 on RA Temp: 98.0 F  Therapeutic Activity: Bed Mobility: Patient performed supine to/from sit with min A with use of hospital bed features, per home set-up. Provided verbal cues for initiation, and progressing through side-lying to improved upper extremity leverage for pushing to sit up. Transfers: Patient performed sit to/from stand from the bed and BSC with min A due to mild posterior bias. Provided verbal cues for hand placement and forward weight shift. Patient performed an ambulatory toilet transfer with CGA using RW. Patient was continent of bladder, required total A for peri-care and lower body clothing management due to need of B upper extremities for standing balance.   Patient in recliner, dressed and ready for d/c, with his son-in-law in the room at end of session with breaks locked and all needs within reach. RN and PA made aware that patient and family are ready for d/c at this time.   Therapy Documentation Precautions:  Precautions Precautions: Fall Restrictions Weight Bearing Restrictions: No    Therapy/Group: Individual Therapy  Kayelee Herbig L Sienna Stonehocker PT, DPT, NCS, CBIS  05/10/2022, 12:58 PM

## 2022-05-10 NOTE — Progress Notes (Signed)
Patient discharged off of unit with all belongings. Discharge papers/instructions explained by physician assistant to family. Patient and family have no further questions at time of discharge. No complications noted at this time.  Taysen Bushart L Alexsandro Salek  

## 2022-05-10 NOTE — Progress Notes (Signed)
Speech Language Pathology Discharge Summary  Patient Details  Name: Eugene [name] MRN: [id number] Date of Birth: 1935/11/28  Date of Discharge from SLP service:May 10, 2022  Today's Date: 05/10/2022 SLP Individual Time: 0730-0803 SLP Individual Time Calculation (min): 33 min   Skilled Therapeutic Interventions:  Skilled treatment session focused on communication and dysphagia goals. Upon arrival, patient was asleep in bed but easily awakened. Patient appeared brighter and was calm and cooperative throughout session. SLP facilitated session by providing tray set-up with breakfast meal of Dys. 3 textures with nectar-thick liquids. Patient consumed meal without overt s/s of aspiration and required overall Min verbal cues for use of swallowing compensatory strategies. Recommend patient continue current diet. Throughout session, patient demonstrating improved awareness and asking appropriate questions regarding discharge. Patient communicated at the phrase and sentence level with minimal paraphasias noted. Overall, patient was ~90% intelligible. Patient left upright in bed with allarm on and all needs within reach. Continue with current plan of care.   Patient has met 7 of 7 long term goals.  Patient to discharge at overall Mod;Max level.   Reasons goals not met: N/A   Clinical Impression/Discharge Summary: Patient has made functional gains and has met 7 of 7 LTGs this admission. Currently, patient is consuming Dys. 3 textures with nectar-thick liquids with minimal overt s/s of aspiration with Min verbal cues for use of swallowing compensatory strategies. Patient's overall cognitive-linguistic functioning continues to fluctuate, especially when fatigued with overall Mod-Max A verbal and visual cues needed to complete functional and familiar tasks safely in regards to sustained attention, emergent awareness, and recall with use of compensatory strategies/external aids. Patient can verbally  express his wants/needs at the phrase level with intermittent paraphasias and neologisms noted. Overall, Mod-Max A multimodal cues are needed to self-monitor and correct errors. Patient and family education is complete and patient will discharge home with 24 hour supervision from family and hired caregivers. Patient would benefit from f/u SLP services to maximize his swallowing and cognitive-linguistic functioning in order to reduce caregiver burden.   Care Partner:  Caregiver Able to Provide Assistance: Yes  Type of Caregiver Assistance: Physical;Cognitive  Recommendation:  Home Health SLP;24 hour supervision/assistance  Rationale for SLP Follow Up: Reduce caregiver burden;Maximize cognitive function and independence;Maximize functional communication;Maximize swallowing safety   Equipment: Thickener   Reasons for discharge: Treatment goals met;Discharged from hospital   Patient/Family Agrees with Progress Made and Goals Achieved: Yes    [name], Artesian 05/10/2022, 11:51 AM

## 2022-05-10 NOTE — Plan of Care (Signed)
  Problem: RH Swallowing Goal: LTG Patient will consume least restrictive diet using compensatory strategies with assistance (SLP) Description: LTG:  Patient will consume least restrictive diet using compensatory strategies with assistance (SLP) Outcome: Completed/Met   Problem: RH Cognition - SLP Goal: RH LTG Patient will demonstrate orientation with cues Description:  LTG:  Patient will demonstrate orientation to person/place/time/situation with cues (SLP)   Outcome: Completed/Met   Problem: RH Expression Communication Goal: LTG Patient will verbally express basic/complex needs(SLP) Description: LTG:  Patient will verbally express basic/complex needs, wants or ideas with cues  (SLP) Outcome: Completed/Met Goal: LTG Patient will increase word finding of common (SLP) Description: LTG:  Patient will increase word finding of common objects/daily info/abstract thoughts with cues using compensatory strategies (SLP). Outcome: Completed/Met   Problem: RH Memory Goal: LTG Patient will use memory compensatory aids to (SLP) Description: LTG:  Patient will use memory compensatory aids to recall biographical/new, daily complex information with cues (SLP) Outcome: Completed/Met   Problem: RH Attention Goal: LTG Patient will demonstrate this level of attention during functional activites (SLP) Description: LTG:  Patient will will demonstrate this level of attention during functional activites (SLP) Outcome: Completed/Met   Problem: RH Awareness Goal: LTG: Patient will demonstrate awareness during functional activites type of (SLP) Description: LTG: Patient will demonstrate awareness during functional activites type of (SLP) Outcome: Completed/Met

## 2022-05-10 NOTE — Progress Notes (Signed)
PROGRESS NOTE   Subjective/Complaints:  Pt had a fair night. Approachable this morning. Refused seroquel again last night but seemed to stay calm sleep most of night. He says he feels tired today  ROS: Limited due to cognitive/behavioral   Objective:   DG CHEST PORT 1 VIEW  Result Date: 05/08/2022 CLINICAL DATA:  Cough EXAM: PORTABLE CHEST 1 VIEW COMPARISON:  05/03/22 CXR FINDINGS: Redemonstrated left-sided pleural effusion. No pneumothorax. No new focal airspace opacity. No displaced rib fractures. Unchanged cardiac contours. Redemonstrated extensive calcifications of the thoracic aorta. IMPRESSION: Redemonstrated left-sided pleural effusion. No new focal airspace opacity. Electronically Signed   By: Lorenza Cambridge M.D.   On: 05/08/2022 13:33   No results for input(s): "WBC", "HGB", "HCT", "PLT" in the last 72 hours.     No results for input(s): "NA", "K", "CL", "CO2", "GLUCOSE", "BUN", "CREATININE", "CALCIUM" in the last 72 hours.     Intake/Output Summary (Last 24 hours) at 05/10/2022 0904 Last data filed at 05/10/2022 0510 Gross per 24 hour  Intake 218 ml  Output 550 ml  Net -332 ml          Physical Exam: Vital Signs Blood pressure 133/74, pulse 75, temperature 98.5 F (36.9 C), temperature source Oral, resp. rate 18, height 6' (1.829 m), weight 77.7 kg, SpO2 98 %.     Constitutional: No distress . Vital signs reviewed. HEENT: NCAT, EOMI, oral membranes moist Neck: supple Cardiovascular: RRR without murmur. No JVD    Respiratory/Chest: CTA Bilaterally without wheezes or rales. Normal effort    GI/Abdomen: BS +, non-tender, non-distended Ext: no clubbing, cyanosis, or edema Psych: calm, cooperative, still confused  Skin: Clean and intact without signs of breakdown Neuro:  Pt oriented to self. Follows simple commands.  Speech dysarthric but clearer. Expressive language deficits. Limited insight and  awareness.  Musculoskeletal: left shoulder and hip still tender but more tolerant of PROM   Assessment/Plan: 1. Functional deficits which require 3+ hours per day of interdisciplinary therapy in a comprehensive inpatient rehab setting. Physiatrist is providing close team supervision and 24 hour management of active medical problems listed below. Physiatrist and rehab team continue to assess barriers to discharge/monitor patient progress toward functional and medical goals  Care Tool:  Bathing    Body parts bathed by patient: Chest, Abdomen, Front perineal area, Right upper leg, Left upper leg, Face, Left arm, Buttocks   Body parts bathed by helper: Right arm, Right lower leg, Left lower leg     Bathing assist Assist Level: Minimal Assistance - Patient > 75%     Upper Body Dressing/Undressing Upper body dressing   What is the patient wearing?: Pull over shirt    Upper body assist Assist Level: Minimal Assistance - Patient > 75%    Lower Body Dressing/Undressing Lower body dressing      What is the patient wearing?: Pants     Lower body assist Assist for lower body dressing: Moderate Assistance - Patient 50 - 74%     Toileting Toileting    Toileting assist Assist for toileting: Moderate Assistance - Patient 50 - 74%     Transfers Chair/bed transfer  Transfers assist  Chair/bed transfer activity  did not occur: Safety/medical concerns  Chair/bed transfer assist level: Contact Guard/Touching assist Chair/bed transfer assistive device: Museum/gallery exhibitions officer assist      Assist level: Contact Guard/Touching assist Assistive device: Walker-rolling Max distance: 5   Walk 10 feet activity   Assist  Walk 10 feet activity did not occur: Safety/medical concerns (decreased activity tolerance)  Assist level: Contact Guard/Touching assist Assistive device: Walker-rolling   Walk 50 feet activity   Assist Walk 50 feet with 2 turns activity  did not occur: Safety/medical concerns  Assist level: Contact Guard/Touching assist Assistive device: Walker-rolling    Walk 150 feet activity   Assist Walk 150 feet activity did not occur: Safety/medical concerns  Assist level: Contact Guard/Touching assist Assistive device: Walker-rolling    Walk 10 feet on uneven surface  activity   Assist Walk 10 feet on uneven surfaces activity did not occur: Safety/medical concerns         Wheelchair     Assist Is the patient using a wheelchair?: Yes Type of Wheelchair: Manual    Wheelchair assist level: Dependent - Patient 0% Max wheelchair distance: >150 ft    Wheelchair 50 feet with 2 turns activity    Assist        Assist Level: Dependent - Patient 0%   Wheelchair 150 feet activity     Assist      Assist Level: Dependent - Patient 0%   Blood pressure 133/74, pulse 75, temperature 98.5 F (36.9 C), temperature source Oral, resp. rate 18, height 6' (1.829 m), weight 77.7 kg, SpO2 98 %.  Medical Problem List and Plan: 1. Functional deficits secondary to Left temporal contusion             -patient may  shower            -dc home today with family and caregivers. Needs supervision for safety/nutrition  -f/u with primary, NS, and me at [facility]   -I suspect he'll improve further from sleep/cognitive standpoint once he's home.    2.  Antithrombotics: -DVT/anticoagulation:  Mechanical: Sequential compression devices, below knee Bilateral lower extremities 11.23- Eliquis restarted after head CT- per NSU             -antiplatelet therapy: N/a 3. Pain Management: left shoulder and hip pain are chronic.    -kpad  -scheduled tylenol  -pt refusing voltaren gel.  -Some improvement in left shoulder after injection 12/2  -IR injected left hip with perceived benefit 4. Mood/Behavior/Sleep: LCSW to follow for evaluation and support.             --Continue melatonin with Trazodone prn for insomnia.              -antipsychotic agents:    Seroquel  -12/8 continued confusion despite rx of pneumonia, adjustment of meds   - ammonia level normal. Labs otherwise improved today   -pt has been on keppra chronically   -suspect this is sundowning in setting of TBI, pneumonia, and perhaps added reaction to recent steroid injections.     -Giving meds earlier at night. Adjusted seroquel to 100mg  at 1900 12/13&14 missed seroquel both nights---really does better when he takes this in the evening for more consistent sleep/decrease agitation  -UA negative, ucx pending 5. Neuropsych/cognition: This patient is not  capable of making decisions on his own behalf. 6. Skin/Wound Care: Routine pressure relief measures.  to multiple wounds  -left forearm wound healing, no cellulitis there 7. Fluids/Electrolytes/Nutrition:  .   -  encourage PO as appropriate -BUN/CR sl improved 12/5  -12/14 bun/cr improved. Dc ivf  -family will need to encourage fluids at home  -he does have CKD 8. CAF: Monitor HR TID-- .              --CT with improvement    -Eliquis restarted per NSU   -no bleeding sequelae 9. H/o GERD:  -resumed PPI. Hold pepcid for now given renal fxn 10. HTN: Monitor BP TID--on nimotop thru 12/05. Continue Lopressor.      12/4 bp fairly controlled with lopressor 25mg  bid 11. Left moderate pleural effusion/ now with LLL pneumonia. 12/6 -cefipime started 12/4--appreciate pharmacy assist.      -WBC's 8k     -added mucinex for cough   12/7 CXR without much change. I see a definite area of consolidation/increased area which wasn't there in October   12/11 -continue abx thru second dose today    12/13 f/u cxr demonstrated some interval improvement 12. Dysphagia -D3/nectars diet per SLP  -needs to be awake and alert when he eats 13. Seizure Hx: Keppra resumed as he was on this previously 58. Hx AAA, control BP  15. Orthostatic hypotension    bp's better with reduced lopressor--   LOS: 24 days A FACE TO [place] 05/10/2022, 9:04 AM

## 2022-05-15 ENCOUNTER — Other Ambulatory Visit: Payer: Medicare Other

## 2022-05-15 ENCOUNTER — Encounter: Payer: Self-pay | Admitting: Oncology

## 2022-05-15 DIAGNOSIS — Z515 Encounter for palliative care: Secondary | ICD-10-CM

## 2022-05-15 NOTE — Progress Notes (Signed)
1314- Palliative Care Note  RN talked with daughter [name] to complete initial palliative phone visit. Two patient identifiers used.   Disease process: Pt was recently discharged, 05[facility]10/22 [facility] for subdural hematoma s/p fall. Hx of CVA as well.   Per daughter, "Wife was diagnosed with dementia several years ago, and they took care of each other. In Jan 2023, caregivers were hired 24hrs/day. In Aug 2023, pt had fall in which he fractured a few ribs an was in the hospital. That same month, his wife's aggression got to the point where she could not be managed at home anymore. She was moved out of the home and then passed in Oct 2023. He seems like he has lost his purpose. He doesn't ever want to leave his house. He says it is just time for him to pass on. Then he fell this past time and had a subdural hematoma."  Mobility- daughter reports that pt uses [facility] as much as possible. But states, " usually he will walk to where he is going like the bathroom and then the caregiver will bring him back in wheelchair. " Has [facility] currently for home health PT, OT, and speech.  Sleep- pt sleeping more and more. Daughter feels like his oxygen levels may drop when he is asleep. Pt has hospital bed however he sleeps just as often in a lift chair recliner.   GI/GU- daughter reports that pt has increasingly become incontinent of bowel and bladder He wears adult pullups and does not seem to know when he needs to go or when he has gone. States, "like the sensation is not even there".   Appetite- daughter reports that pt eats very little. States, "he was an avid water drinker until he failed his swallow study in the hospital and requires thick it in his food and drinks. He does not drink like he used to. We encourage fluids often."  Palliative Care / Hospice- RN explained purpose and benefit of palliative care and hospice. Talked at length with daughter  about hospice care and focus on quality versus quantity. Daughter very open to hospice eval.   ACP- Pt is DNR per chart.  RN to contact Dr. Donette Larry office to request hospice referral.

## 2022-06-01 ENCOUNTER — Other Ambulatory Visit: Payer: Self-pay | Admitting: Physical Medicine and Rehabilitation

## 2022-06-05 ENCOUNTER — Other Ambulatory Visit: Payer: Self-pay | Admitting: Physical Medicine and Rehabilitation

## 2022-06-07 ENCOUNTER — Other Ambulatory Visit: Payer: Self-pay | Admitting: Physical Medicine and Rehabilitation

## 2022-06-27 ENCOUNTER — Encounter: Payer: Medicare Other | Admitting: Physical Medicine & Rehabilitation

## 2022-07-10 ENCOUNTER — Ambulatory Visit: Payer: Medicare Other | Admitting: Cardiology

## 2022-08-27 DEATH — deceased

## 2022-11-27 ENCOUNTER — Other Ambulatory Visit (HOSPITAL_COMMUNITY): Payer: Self-pay

## 2023-11-19 IMAGING — CT CT HEAD W/O CM
4 series · 15 of 47 positions shown, 17 images · non-contrast
Comparison: CT head 03/24/2021

CLINICAL DATA: Found on the floor, seizure



[Series 3: head without · axial · non-contrast · 0.47mm/px · z∈[-138,-8]mm · 7 of 36 slices shown, 9 images]
[im 5/36  brain]
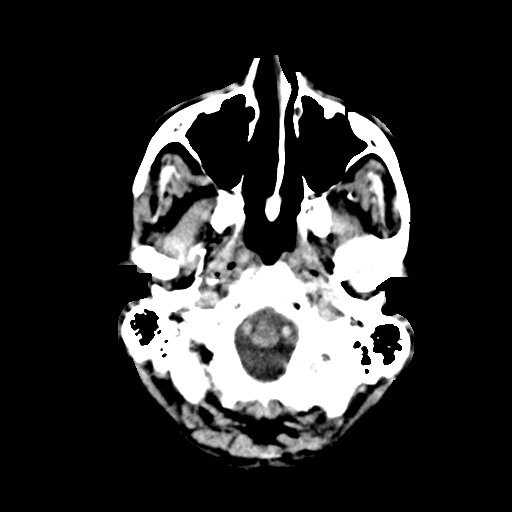
[im 5/36  bone]
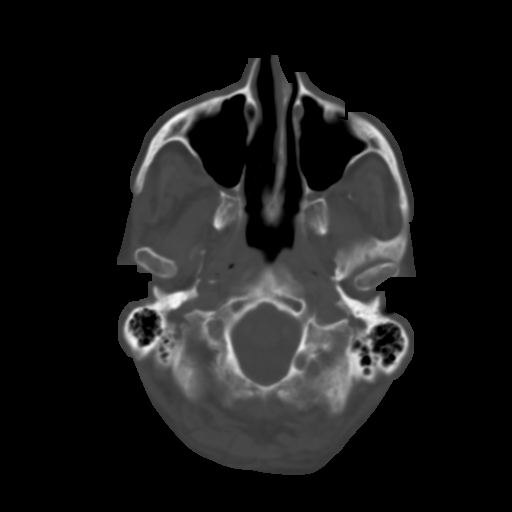
[im 9/36  brain]
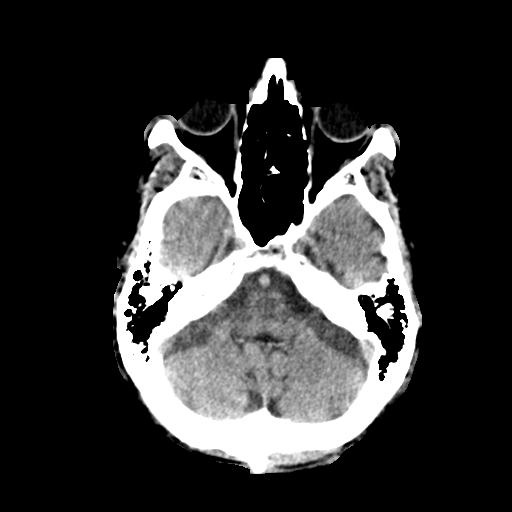
[im 14/36  brain]
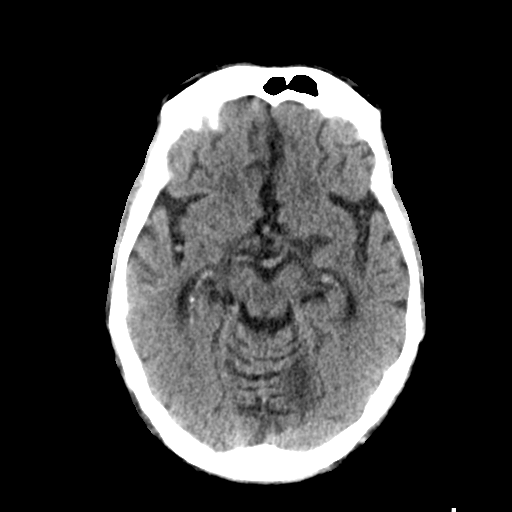
[im 18/36  brain]
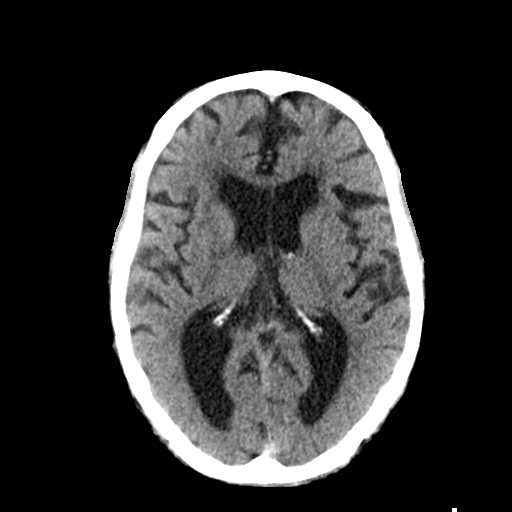
[im 22/36  brain]
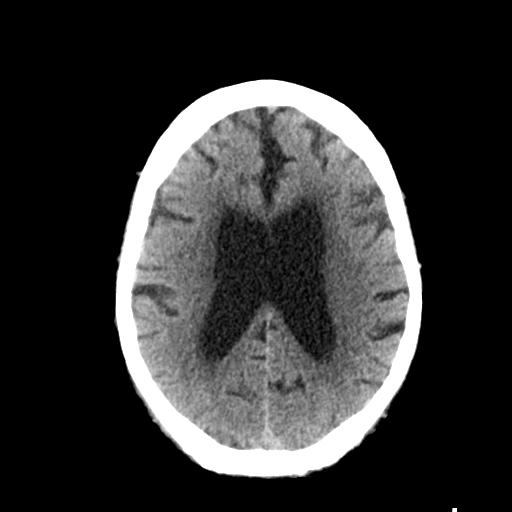
[im 22/36  bone]
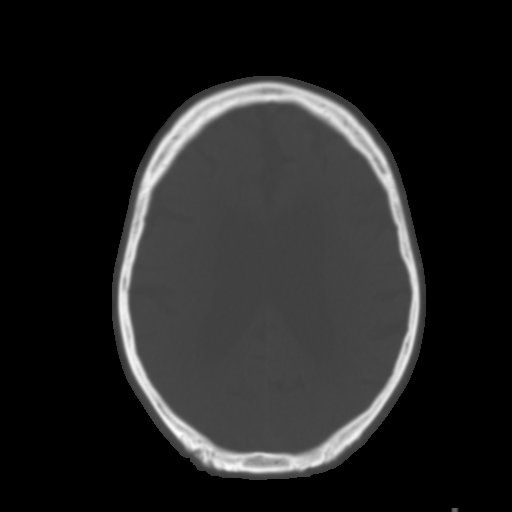
[im 27/36  brain]
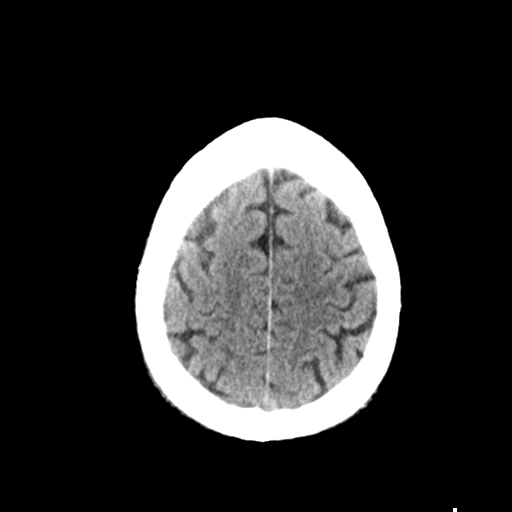
[im 31/36  brain]
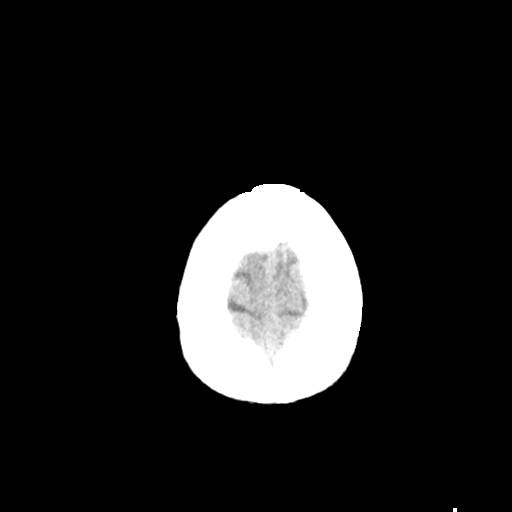

[Series 4: head bone · axial · 0.47mm/px · z∈[-142,-124]mm · 2 of 89 slices shown]
[im 9/89  bone]
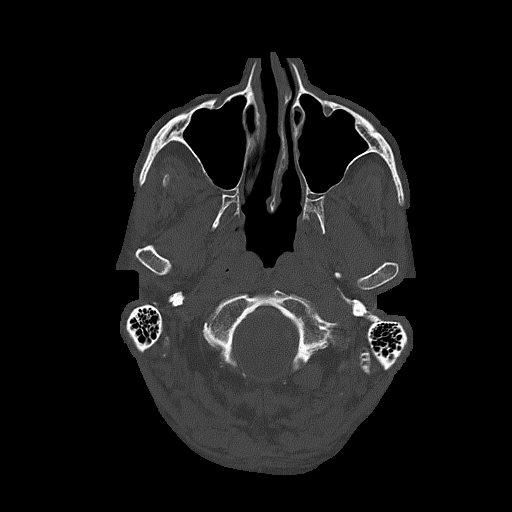
[im 18/89  bone]
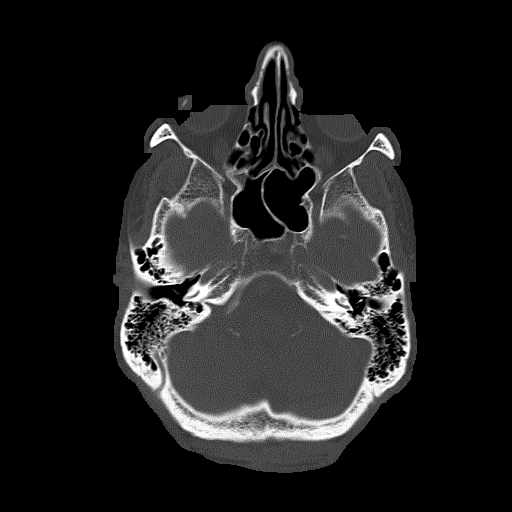

[Series 5: head without cor · coronal · non-contrast · 0.35mm/px · 3 of 67 slices shown]
[im 23/67  brain]
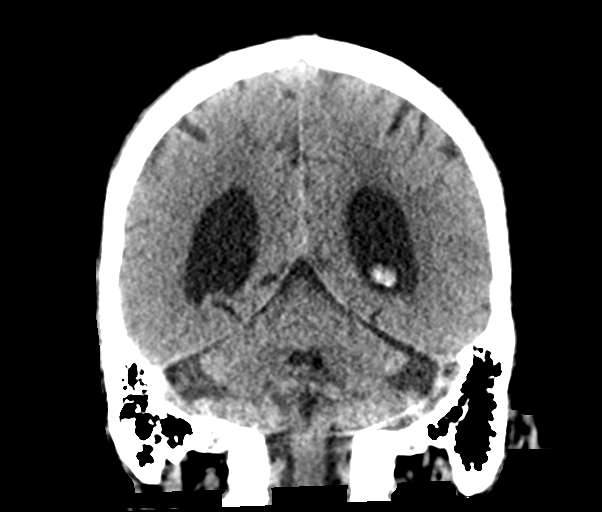
[im 30/67  brain]
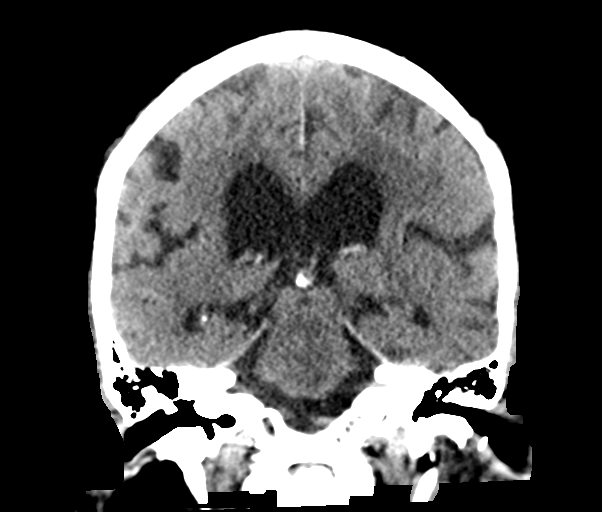
[im 37/67  brain]
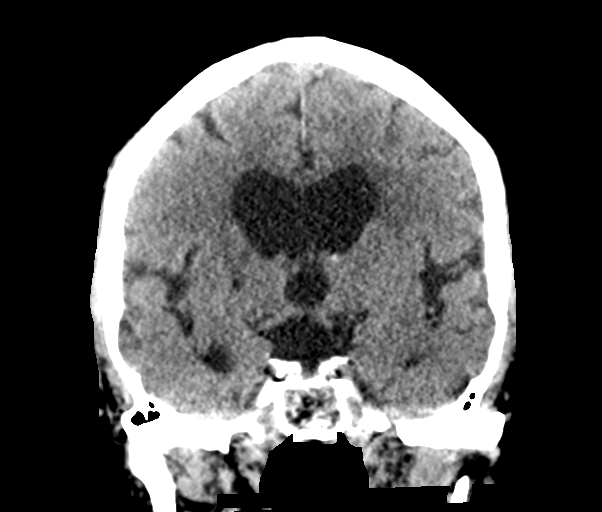

[Series 6: head without sag · sagittal · non-contrast · 0.35mm/px · 3 of 66 slices shown]
[im 22/66  brain]
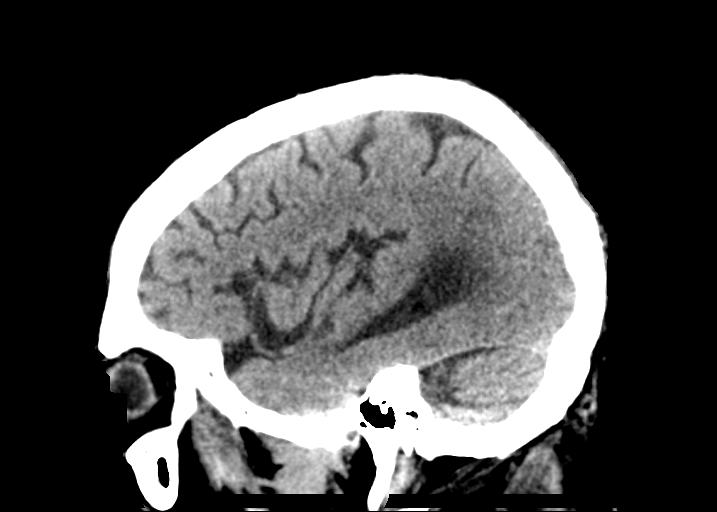
[im 33/66  brain]
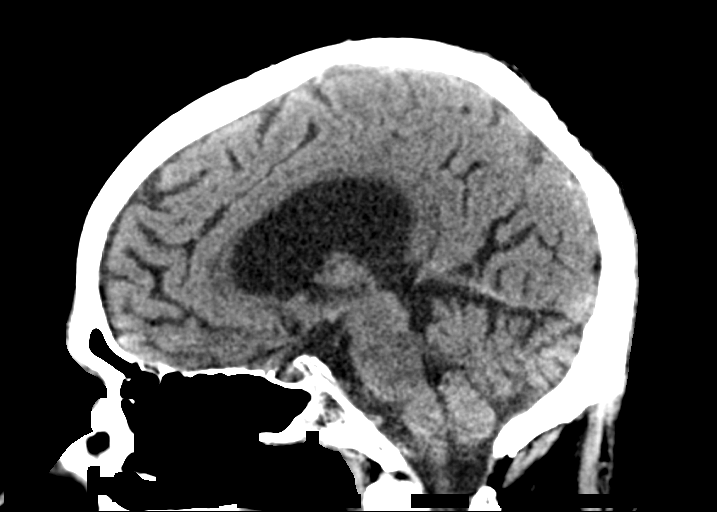
[im 44/66  brain]
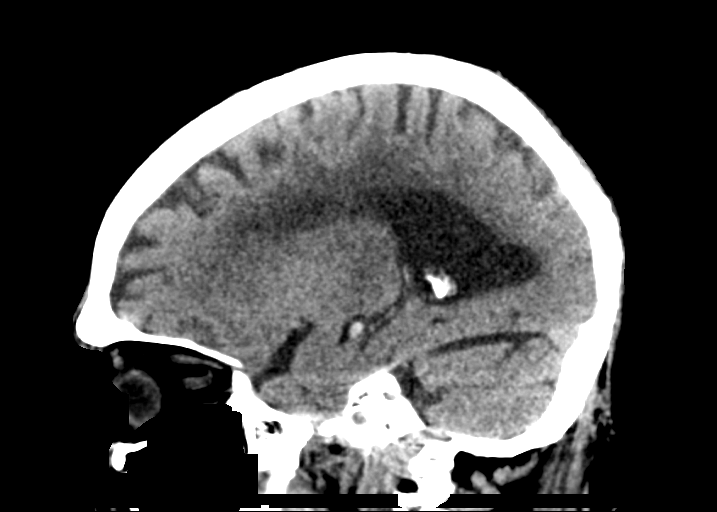

[15 of 47 positions shown; findings below may reference images not displayed]

FINDINGS: Brain: There is no evidence of acute intracranial hemorrhage,
extra-axial fluid collection, or acute infarct.

There is mild global parenchymal volume loss with prominence of the
ventricular system and extra-axial CSF spaces. The ventricles are
stable in size and configuration compared to the prior study.
Gray-white differentiation is preserved. Patchy hypodensity in the
subcortical and periventricular white matter likely reflects sequela
of chronic white matter microangiopathy. There is a small remote
lacunar infarct in the right basal ganglia.

There is no solid mass lesion. There is no mass effect or midline
shift.

Vascular: There is calcification of the bilateral cavernous ICAs.

Skull: Normal. Negative for fracture or focal lesion.

Sinuses/Orbits: Imaged paranasal sinuses are clear. Bilateral lens
implants are in place. The globes and orbits are otherwise
unremarkable.

Other: None.
IMPRESSION: No acute intracranial pathology.

## 2023-11-19 IMAGING — DX DG CHEST 1V PORT
1 series · 1 of 1 positions shown · non-contrast
Comparison: Chest radiographs 03/24/2021 and earlier.

CLINICAL DATA: 86-year-old male status post fall on blood thinners.
Question seizure.

EXAM:
PORTABLE CHEST 1 VIEW

[chest ap]
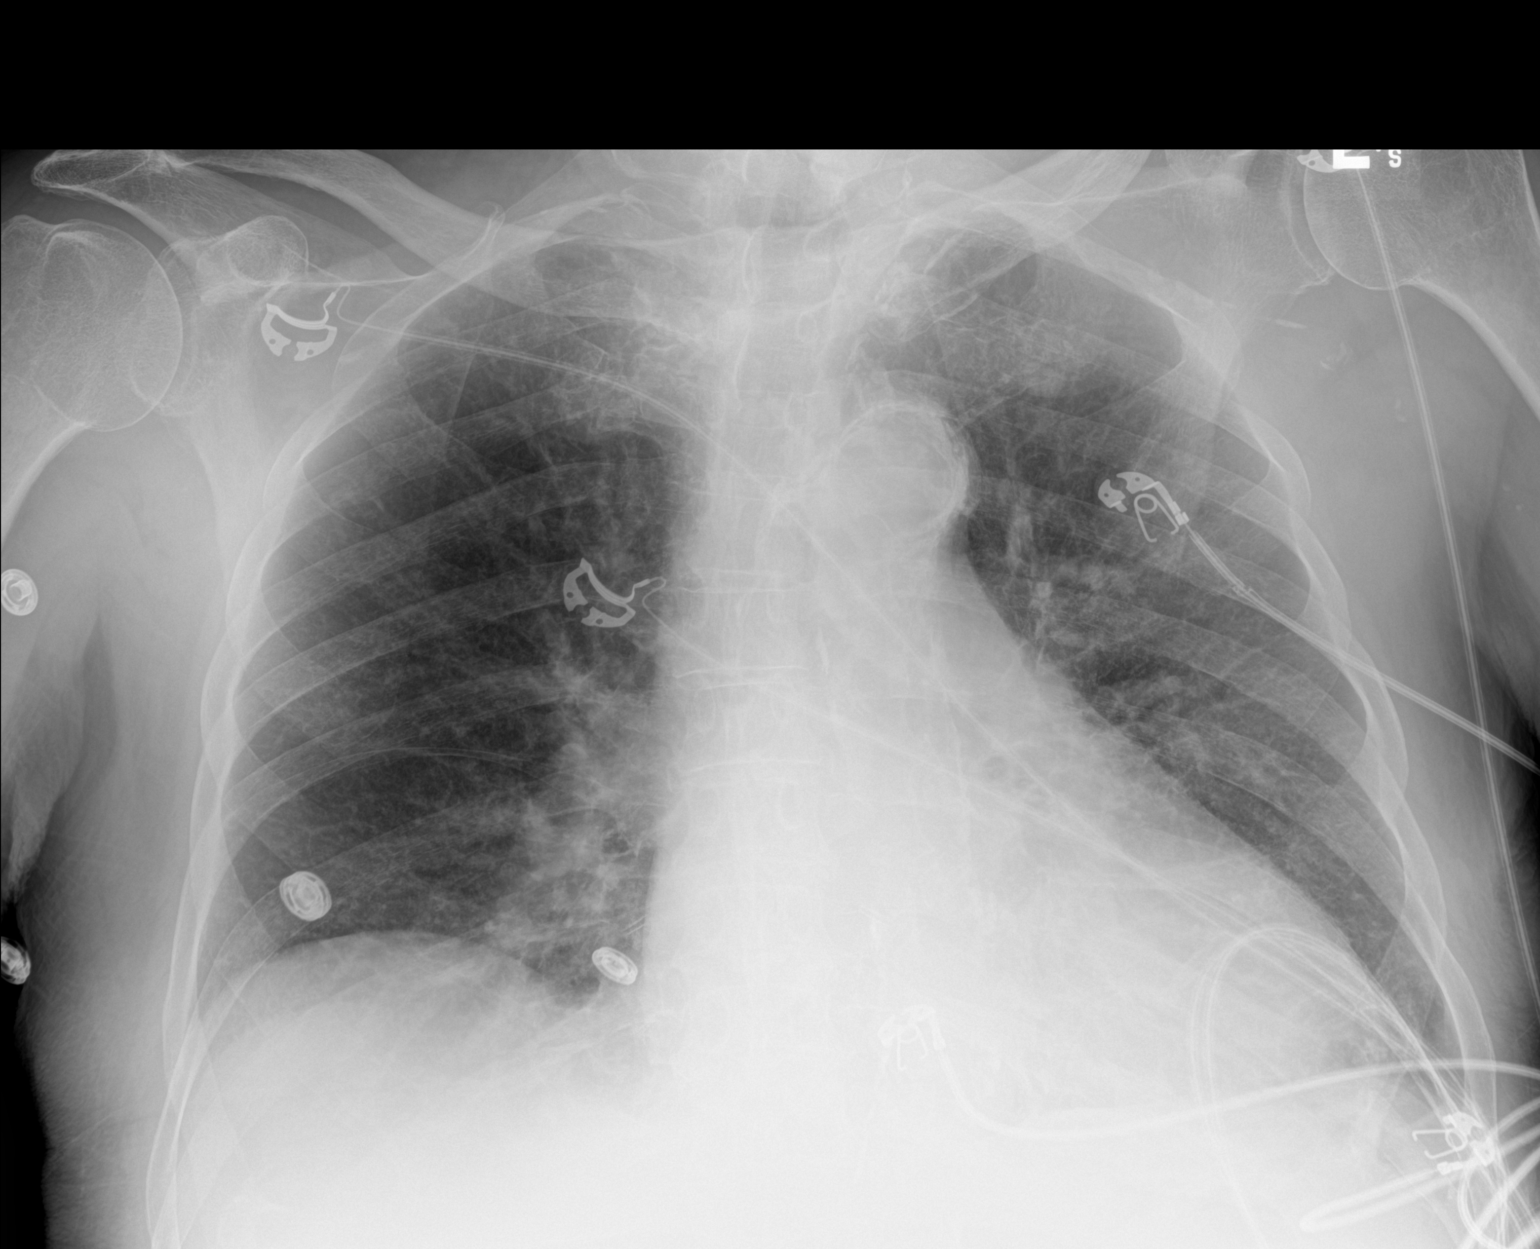

[1 of 1 positions shown; findings below may reference images not displayed]

FINDINGS: Portable AP semi upright view at 0383 hours. Mildly rotated to the
left, but the cardiac silhouette appears increased from last year.
Calcified aortic atherosclerosis. Other mediastinal contours are
within normal limits. Visualized tracheal air column is within
normal limits. Allowing for portable technique the lungs are clear.
No pneumothorax or pleural effusion. No acute osseous abnormality
identified.
IMPRESSION: 1. Questionable new cardiomegaly since last year. Aortic
Atherosclerosis ([id number]).
2. No acute cardiopulmonary abnormality.

## 2023-11-19 IMAGING — DX DG PORTABLE PELVIS
1 series · 1 of 1 positions shown · non-contrast
Comparison: CT Abdomen and Pelvis 03/26/2013.

CLINICAL DATA: 86-year-old male status post fall on blood thinners.
Question seizure.

EXAM:
PORTABLE PELVIS 1-2 VIEWS

[pelvis ap]
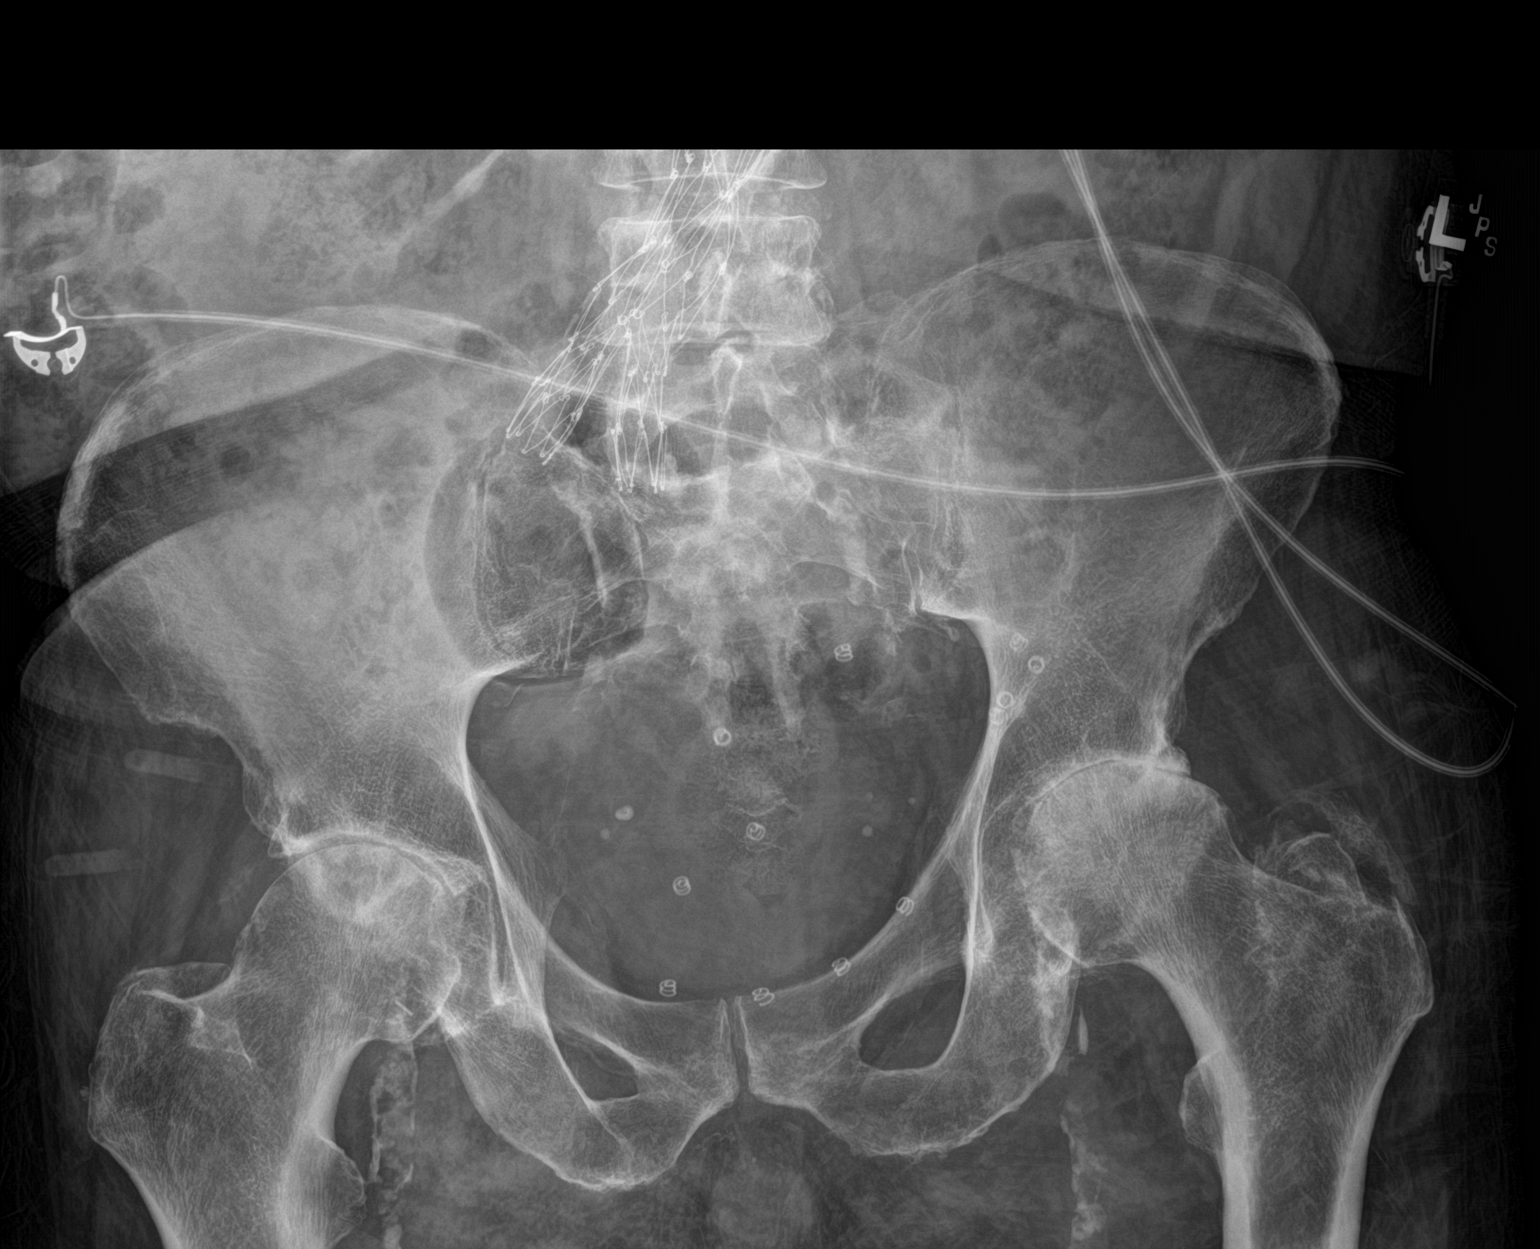

[1 of 1 positions shown; findings below may reference images not displayed]

FINDINGS: Chronic bifurcated abdominal aortic endograft was present in 0571,
visible configuration is stable. Progressed and severe bilateral hip
joint space loss, worse on the left. Associated subchondral
sclerosis. Femoral heads appear normally located. Grossly intact
proximal femurs. No pelvis fracture identified. Bulky calcified
femoral artery atherosclerosis. Previous inguinal hernia repair with
mesh, present in 0571. Negative visible bowel gas.
IMPRESSION: 1. No acute fracture or dislocation identified about the pelvis.
2. Progressed since 0571 and severe hip osteoarthritis, worse on the
left.
3. Chronic abdominal Aortic Endograft. Femoral artery
atherosclerosis.

## 2023-11-19 IMAGING — DX DG ABDOMEN 1V
1 series · 2 of 2 positions shown · non-contrast
Comparison: None.

CLINICAL DATA: Evaluate for metallic foreign bodies

EXAM:
ABDOMEN - 1 VIEW

[Series 1: abdomen · 0.14mm/px · 2 of 2 slices shown]
[im 1/2]
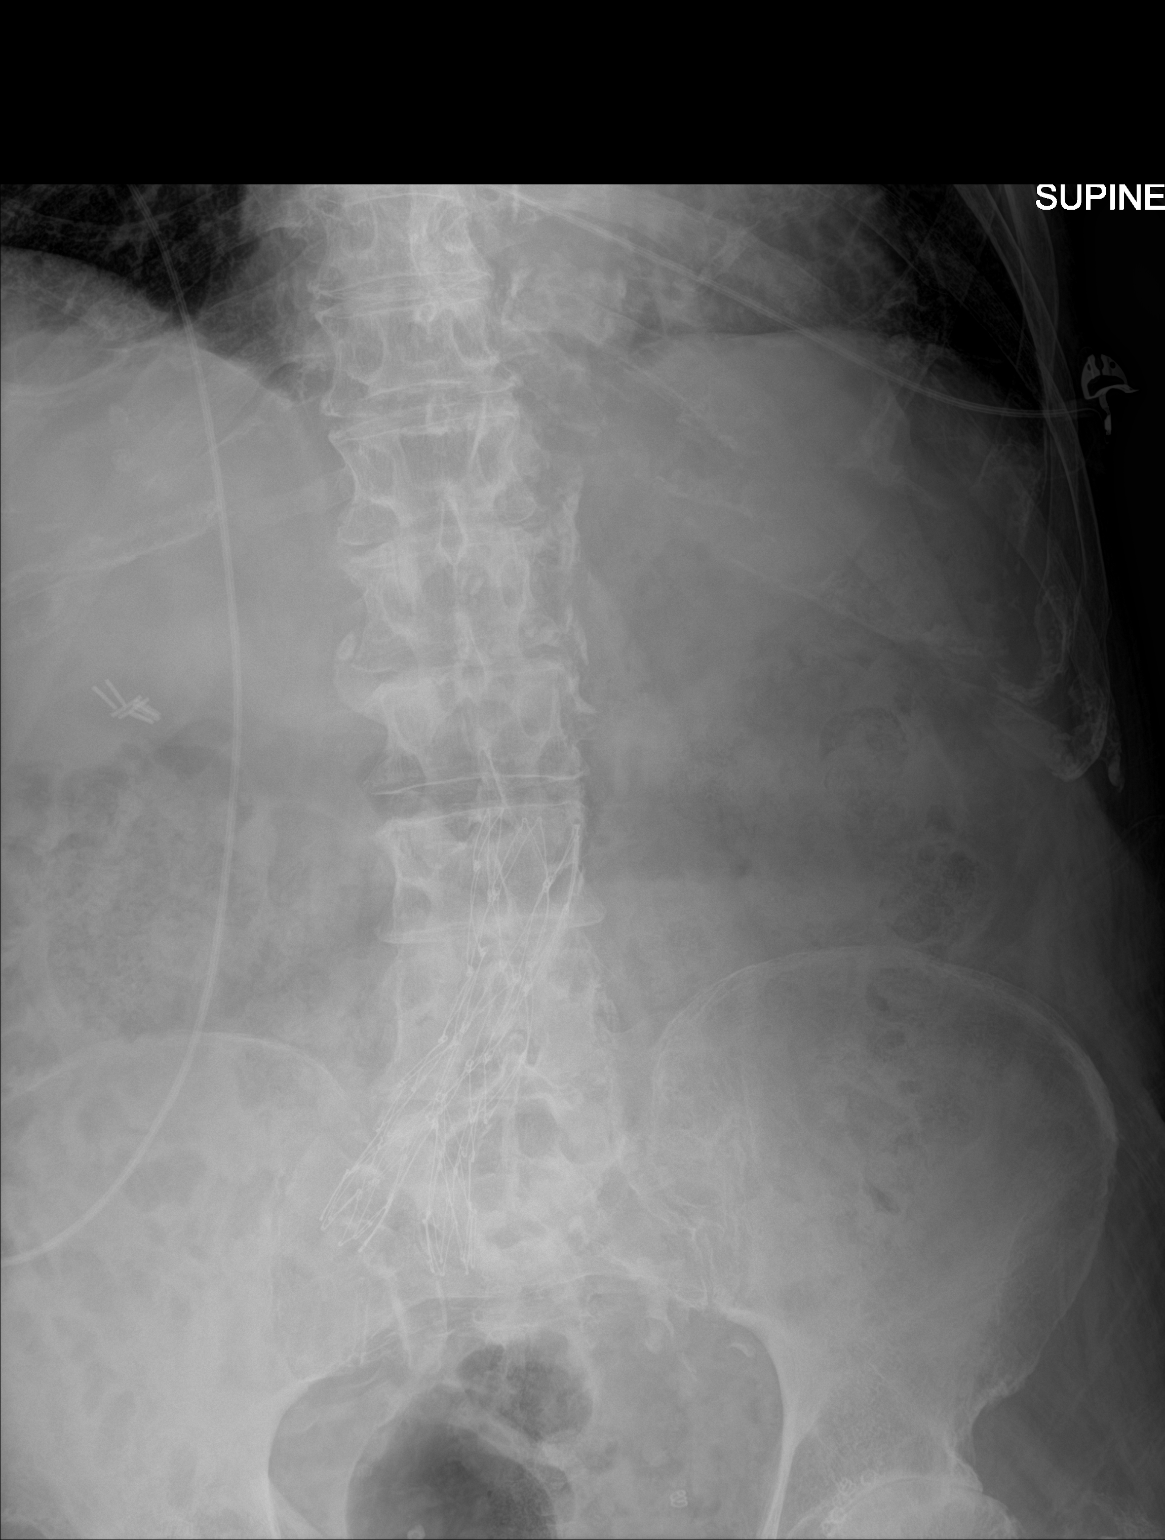
[im 2/2]
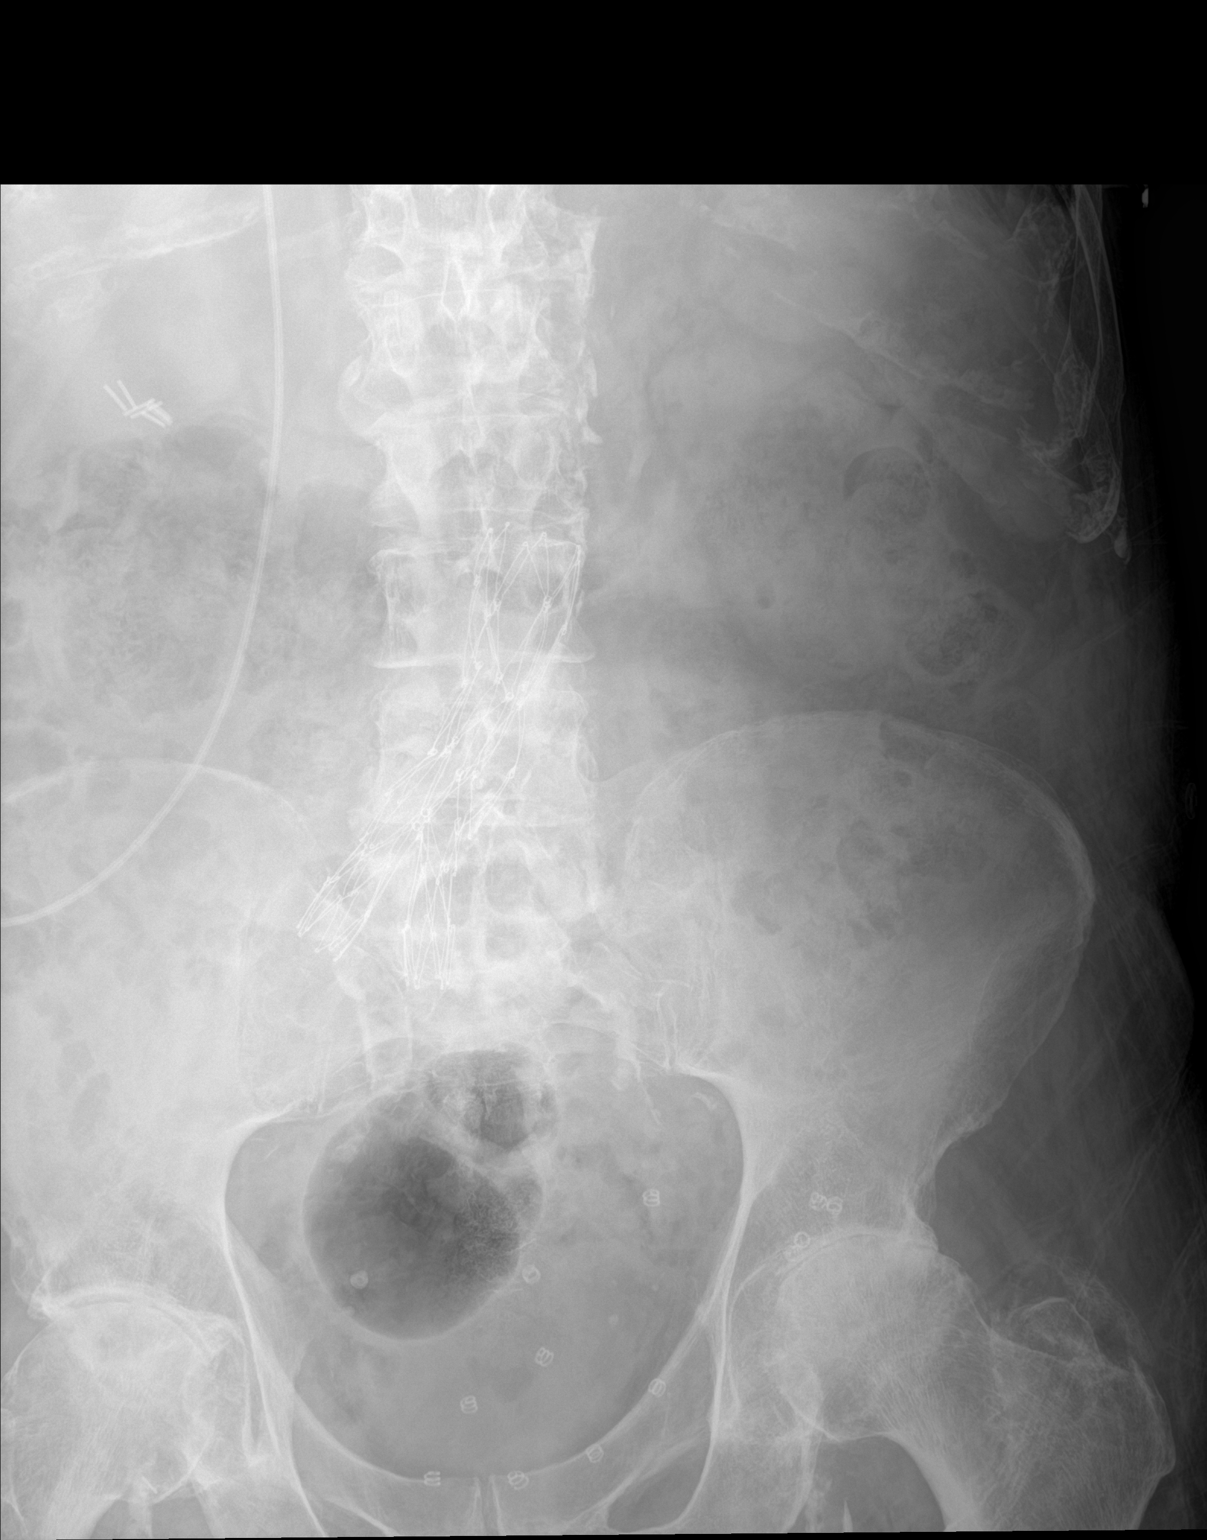

[2 of 2 positions shown; findings below may reference images not displayed]

FINDINGS: There is previous endovascular stent repair of abdominal aorta and
common iliac arteries. Surgical clips are seen in the right upper
quadrant. There is no definite demonstrable electronic device in the
visualized portions of abdomen and pelvis. Right side of the abdomen
is not included in its entirety. Degenerative changes are noted in
both hips with possible avascular necrosis in the femoral heads.
Arterial calcifications are seen in the soft tissues.
IMPRESSION: Imaging findings as described in the body of the report.
